# Patient Record
Sex: Female | Born: 1960
Health system: Southern US, Community
[De-identification: ages and names within clinical notes are randomized; demographics above are authoritative.]

## PROBLEM LIST (undated history)

## (undated) DIAGNOSIS — I700 Atherosclerosis of aorta: Secondary | ICD-10-CM

## (undated) DIAGNOSIS — T451X5A Adverse effect of antineoplastic and immunosuppressive drugs, initial encounter: Secondary | ICD-10-CM

## (undated) DIAGNOSIS — G620 Drug-induced polyneuropathy: Secondary | ICD-10-CM

## (undated) DIAGNOSIS — I10 Essential (primary) hypertension: Secondary | ICD-10-CM

## (undated) DIAGNOSIS — K219 Gastro-esophageal reflux disease without esophagitis: Secondary | ICD-10-CM

## (undated) DIAGNOSIS — J189 Pneumonia, unspecified organism: Secondary | ICD-10-CM

## (undated) DIAGNOSIS — J449 Chronic obstructive pulmonary disease, unspecified: Secondary | ICD-10-CM

## (undated) DIAGNOSIS — E872 Acidosis, unspecified: Secondary | ICD-10-CM

## (undated) DIAGNOSIS — D696 Thrombocytopenia, unspecified: Secondary | ICD-10-CM

## (undated) DIAGNOSIS — N179 Acute kidney failure, unspecified: Secondary | ICD-10-CM

## (undated) DIAGNOSIS — E876 Hypokalemia: Secondary | ICD-10-CM

## (undated) DIAGNOSIS — C50919 Malignant neoplasm of unspecified site of unspecified female breast: Secondary | ICD-10-CM

## (undated) DIAGNOSIS — F32A Depression, unspecified: Secondary | ICD-10-CM

## (undated) DIAGNOSIS — F419 Anxiety disorder, unspecified: Secondary | ICD-10-CM

## (undated) DIAGNOSIS — E43 Unspecified severe protein-calorie malnutrition: Secondary | ICD-10-CM

## (undated) DIAGNOSIS — Z9221 Personal history of antineoplastic chemotherapy: Secondary | ICD-10-CM

## (undated) DIAGNOSIS — C50911 Malignant neoplasm of unspecified site of right female breast: Secondary | ICD-10-CM

## (undated) DIAGNOSIS — R0600 Dyspnea, unspecified: Secondary | ICD-10-CM

## (undated) HISTORY — DX: Gastro-esophageal reflux disease without esophagitis: K21.9

## (undated) HISTORY — DX: Anxiety disorder, unspecified: F41.9

## (undated) HISTORY — DX: Malignant neoplasm of unspecified site of unspecified female breast: C50.919

## (undated) HISTORY — PX: BREAST BIOPSY: SHX20

## (undated) HISTORY — PX: FOOT SURGERY: SHX648

## (undated) HISTORY — DX: Atherosclerosis of aorta: I70.0

---

## 2004-01-03 ENCOUNTER — Emergency Department: Payer: Self-pay | Admitting: Emergency Medicine

## 2004-01-06 ENCOUNTER — Emergency Department: Payer: Self-pay | Admitting: Internal Medicine

## 2004-01-16 ENCOUNTER — Emergency Department: Payer: Self-pay | Admitting: Unknown Physician Specialty

## 2004-07-09 ENCOUNTER — Emergency Department: Payer: Self-pay | Admitting: Emergency Medicine

## 2005-05-31 ENCOUNTER — Other Ambulatory Visit: Payer: Self-pay

## 2005-06-01 ENCOUNTER — Ambulatory Visit: Payer: Self-pay | Admitting: Surgery

## 2009-11-24 ENCOUNTER — Other Ambulatory Visit: Payer: Self-pay | Admitting: Internal Medicine

## 2014-02-16 ENCOUNTER — Inpatient Hospital Stay: Payer: Self-pay | Admitting: Internal Medicine

## 2014-02-16 LAB — BASIC METABOLIC PANEL
Anion Gap: 22 — ABNORMAL HIGH (ref 7–16)
BUN: 12 mg/dL (ref 7–18)
CREATININE: 0.77 mg/dL (ref 0.60–1.30)
Calcium, Total: 6.7 mg/dL — CL (ref 8.5–10.1)
Chloride: 109 mmol/L — ABNORMAL HIGH (ref 98–107)
Co2: 9 mmol/L — CL (ref 21–32)
EGFR (Non-African Amer.): >60
GLUCOSE: 217 mg/dL — AB (ref 65–99)
Osmolality: 286 (ref 275–301)
Potassium: 5.7 mmol/L — ABNORMAL HIGH (ref 3.5–5.1)
Sodium: 140 mmol/L (ref 136–145)

## 2014-02-16 LAB — CBC
HCT: 51.1 % — ABNORMAL HIGH (ref 35.0–47.0)
HGB: 15.7 g/dL (ref 12.0–16.0)
MCH: 33.3 pg (ref 26.0–34.0)
MCHC: 30.8 g/dL — AB (ref 32.0–36.0)
MCV: 108 fL — ABNORMAL HIGH (ref 80–100)
PLATELETS: 332 10*3/uL (ref 150–440)
RBC: 4.73 10*6/uL (ref 3.80–5.20)
RDW: 15.7 % — ABNORMAL HIGH (ref 11.5–14.5)
WBC: 19.3 10*3/uL — AB (ref 3.6–11.0)

## 2014-02-16 LAB — COMPREHENSIVE METABOLIC PANEL
ALK PHOS: 56 U/L
ALT: 68 U/L — AB
ALT: 41 U/L
ANION GAP: 31 — AB (ref 7–16)
AST: 45 U/L — AB (ref 15–37)
Albumin: 5.1 g/dL — ABNORMAL HIGH (ref 3.4–5.0)
Albumin: 3.3 g/dL — ABNORMAL LOW (ref 3.4–5.0)
Alkaline Phosphatase: 95 U/L
Anion Gap: 19 — ABNORMAL HIGH (ref 7–16)
BILIRUBIN TOTAL: 0.5 mg/dL (ref 0.2–1.0)
BUN: 14 mg/dL (ref 7–18)
BUN: 8 mg/dL (ref 7–18)
Bilirubin,Total: 0.8 mg/dL (ref 0.2–1.0)
CALCIUM: 9.1 mg/dL (ref 8.5–10.1)
CALCIUM: 7.7 mg/dL — AB (ref 8.5–10.1)
CREATININE: 1.07 mg/dL (ref 0.60–1.30)
Chloride: 100 mmol/L (ref 98–107)
Chloride: 103 mmol/L (ref 98–107)
Co2: 5 mmol/L — CL (ref 21–32)
Co2: 13 mmol/L — ABNORMAL LOW (ref 21–32)
Creatinine: 0.76 mg/dL (ref 0.60–1.30)
EGFR (African American): >60
EGFR (African American): >60
EGFR (Non-African Amer.): 57 — ABNORMAL LOW
GLUCOSE: 189 mg/dL — AB (ref 65–99)
Glucose: 150 mg/dL — ABNORMAL HIGH (ref 65–99)
OSMOLALITY: 277 (ref 275–301)
Osmolality: 271 (ref 275–301)
POTASSIUM: 4.9 mmol/L (ref 3.5–5.1)
POTASSIUM: 4 mmol/L (ref 3.5–5.1)
SGOT(AST): 69 U/L — ABNORMAL HIGH (ref 15–37)
SODIUM: 136 mmol/L (ref 136–145)
SODIUM: 135 mmol/L — AB (ref 136–145)
TOTAL PROTEIN: 6.5 g/dL (ref 6.4–8.2)
Total Protein: 9.9 g/dL — ABNORMAL HIGH (ref 6.4–8.2)

## 2014-02-16 LAB — ETHANOL

## 2014-02-16 LAB — URINALYSIS, COMPLETE
Bacteria: NONE SEEN
Bilirubin,UR: NEGATIVE
Glucose,UR: NEGATIVE mg/dL (ref 0–75)
Hyaline Cast: 8
Leukocyte Esterase: NEGATIVE
Nitrite: NEGATIVE
Ph: 5 (ref 4.5–8.0)
Protein: >=500
RBC,UR: 10 /HPF (ref 0–5)
Specific Gravity: 1.014 (ref 1.003–1.030)
Squamous Epithelial: 5
WBC UR: 4 /HPF (ref 0–5)

## 2014-02-16 LAB — DRUG SCREEN, URINE
Amphetamines, Ur Screen: NEGATIVE (ref ?–1000)
Barbiturates, Ur Screen: NEGATIVE (ref ?–200)
Benzodiazepine, Ur Scrn: NEGATIVE (ref ?–200)
Cannabinoid 50 Ng, Ur ~~LOC~~: NEGATIVE (ref ?–50)
Cocaine Metabolite,Ur ~~LOC~~: NEGATIVE (ref ?–300)
MDMA (ECSTASY) UR SCREEN: NEGATIVE (ref ?–500)
Methadone, Ur Screen: NEGATIVE (ref ?–300)
Opiate, Ur Screen: NEGATIVE (ref ?–300)
Phencyclidine (PCP) Ur S: NEGATIVE (ref ?–25)
TRICYCLIC, UR SCREEN: NEGATIVE (ref ?–1000)

## 2014-02-16 LAB — PHOSPHORUS
Phosphorus: 6.4 mg/dL — ABNORMAL HIGH (ref 2.5–4.9)
Phosphorus: 3 mg/dL (ref 2.5–4.9)

## 2014-02-16 LAB — DIFFERENTIAL
Basophil #: 0.1 10*3/uL (ref 0.0–0.1)
Basophil %: 0.3 %
EOS ABS: 0 10*3/uL (ref 0.0–0.7)
Eosinophil %: 0 %
LYMPHS ABS: 1.7 10*3/uL (ref 1.0–3.6)
Lymphocyte %: 8.9 %
MONO ABS: 1.1 x10 3/mm — AB (ref 0.2–0.9)
MONOS PCT: 5.9 %
NEUTROS ABS: 16.3 10*3/uL — AB (ref 1.4–6.5)
Neutrophil %: 84.9 %

## 2014-02-16 LAB — OSMOLALITY: Osmolality: 325 mOsm/kg (ref 275–295)

## 2014-02-16 LAB — TSH: THYROID STIMULATING HORM: 1.74 u[IU]/mL

## 2014-02-16 LAB — MAGNESIUM: Magnesium: 2.3 mg/dL

## 2014-02-16 LAB — PROTIME-INR
INR: 1.1
Prothrombin Time: 14 secs (ref 11.5–14.7)

## 2014-02-16 LAB — TROPONIN I: Troponin-I: <0.02 ng/mL

## 2014-02-16 LAB — SALICYLATE LEVEL
SALICYLATES, SERUM: 14.1 mg/dL — AB
Salicylates, Serum: 4.3 mg/dL — ABNORMAL HIGH

## 2014-02-16 LAB — ACETAMINOPHEN LEVEL

## 2014-02-17 LAB — COMPREHENSIVE METABOLIC PANEL
AST: 41 U/L — AB (ref 15–37)
Albumin: 3 g/dL — ABNORMAL LOW (ref 3.4–5.0)
Alkaline Phosphatase: 47 U/L
Anion Gap: 8 (ref 7–16)
BUN: 6 mg/dL — ABNORMAL LOW (ref 7–18)
Bilirubin,Total: 0.8 mg/dL (ref 0.2–1.0)
Calcium, Total: 7.9 mg/dL — ABNORMAL LOW (ref 8.5–10.1)
Chloride: 100 mmol/L (ref 98–107)
Co2: 26 mmol/L (ref 21–32)
Creatinine: 0.81 mg/dL (ref 0.60–1.30)
EGFR (African American): >60
EGFR (Non-African Amer.): >60
GLUCOSE: 108 mg/dL — AB (ref 65–99)
Osmolality: 266 (ref 275–301)
Potassium: 2.9 mmol/L — ABNORMAL LOW (ref 3.5–5.1)
SGPT (ALT): 36 U/L
Sodium: 134 mmol/L — ABNORMAL LOW (ref 136–145)
Total Protein: 5.9 g/dL — ABNORMAL LOW (ref 6.4–8.2)

## 2014-02-17 LAB — CBC WITH DIFFERENTIAL/PLATELET
Basophil #: 0.1 10*3/uL (ref 0.0–0.1)
Basophil %: 0.6 %
EOS PCT: 0.4 %
Eosinophil #: 0 10*3/uL (ref 0.0–0.7)
HCT: 37.7 % (ref 35.0–47.0)
HGB: 12.7 g/dL (ref 12.0–16.0)
LYMPHS ABS: 3.2 10*3/uL (ref 1.0–3.6)
Lymphocyte %: 33.3 %
MCH: 33.6 pg (ref 26.0–34.0)
MCHC: 33.6 g/dL (ref 32.0–36.0)
MCV: 100 fL (ref 80–100)
MONO ABS: 0.4 x10 3/mm (ref 0.2–0.9)
Monocyte %: 3.8 %
NEUTROS PCT: 61.9 %
Neutrophil #: 5.9 10*3/uL (ref 1.4–6.5)
Platelet: 156 10*3/uL (ref 150–440)
RBC: 3.77 10*6/uL — AB (ref 3.80–5.20)
RDW: 14.1 % (ref 11.5–14.5)
WBC: 9.5 10*3/uL (ref 3.6–11.0)

## 2014-02-17 LAB — HEMOGLOBIN A1C: Hemoglobin A1C: 5.1 % (ref 4.2–6.3)

## 2014-02-17 LAB — MAGNESIUM
MAGNESIUM: 1.3 mg/dL — AB
Magnesium: 2.1 mg/dL

## 2014-02-17 LAB — URINE CULTURE

## 2014-02-17 LAB — POTASSIUM: Potassium: 3.3 mmol/L — ABNORMAL LOW (ref 3.5–5.1)

## 2014-02-21 LAB — CULTURE, BLOOD (SINGLE)

## 2014-07-05 NOTE — Discharge Summary (Signed)
PATIENT NAME:  Yvonne Scott, Yvonne Scott MR#:  [id number] DATE OF BIRTH:  [birth year]  DISCHARGE DIAGNOSES: 1.  Metabolic acidosis of unknown etiology.  2.  Tobacco abuse.  3.  Reflux esophagitis.   DISCHARGE MEDICATIONS: 1.  Protonix 40 mg oral 2 times a day.  2.  Magnesium oxide 400 mg daily.   DISCHARGE INSTRUCTIONS: Regular food, regular activity. Follow up with [name] in 2-4 weeks. Quit smoking and primary care physician followup in 1-2 weeks.   CONSULTS: 1.  Mariane Duval, MD, with critical care  2.  Munsoor Lilian Kapur, MD, with nephrology.   ADMITTING HISTORY AND PHYSICAL: Please see detailed H and P dictated previously. In brief, a 54 year old Caucasian female patient with no past medical history. Came to the hospital complaining of fever, vomiting. She was found to have severe metabolic acidosis with pH of 6.92 with bicarbonate less than 5 and admitted to hospitalist service.   HOSPITAL COURSE:  The patient was admitted to ICU. The patient's serum osmolality was elevated. She possibly had ingested 1 of the volatile substances, although the patient completely denied this. After discussing further with Dr. Mortimer Fries and nephrology, symptomatic management with continued with bicarbonate drip, aggressive IV fluid resuscitation with which the patient's symptoms resolved. She felt back to baseline by day of discharge other than having some mild dysphagia, which is chronic, for which she will follow up with GI as outpatient.   Prior to discharge, the patient's lungs sound clear. S1, S2 heard. No edema. The patient has ambulated in the hallway and is being discharged home in fair condition.   TIME SPENT ON DAY OF DISCHARGE IN DISCHARGE ACTIVITY: 40 minutes.    ____________________________ Leia Alf Landyn Lorincz, MD srs:LT D: 02/25/2014 16:50:24 ET T: 02/25/2014 17:37:45 ET JOB#: [id number]  cc: Alveta Heimlich R. Kervin Bones, MD, <Dictator> Neita Carp MD ELECTRONICALLY SIGNED 02/27/2014 13:07

## 2014-07-05 NOTE — H&P (Signed)
PATIENT NAME:  Yvonne Scott, Yvonne Scott MR#:  [id number] DATE OF BIRTH:  January 30, 1961  DATE OF ADMISSION:  02/16/2014  PRIMARY CARE PROVIDER: Anderson Malta A. Gilford Rile, MD   CHIEF COMPLAINT: Vomiting.   HISTORY OF PRESENT ILLNESS: A 54 year old Caucasian female patient with no past medical history. Not on any home medications presents to the Emergency Room complaining of severe vomiting since yesterday morning. The patient mentions that she just could not count the number of times she has thrown up since yesterday. Did not see any blood. Presently is only gagging, but no vomiting. She has been found to be severely tachycardic into 130s along with elevated white count of 19,000 and severely acidotic with pH of 6.92 with bicarbonate less than 5.   She does complain of her shortness of breath, but no cough. Has had decreased urination. No diarrhea, constipation. Epigastric abdominal pain only when she vomits. No blood in her stool. Does not complain of any rash. She used some amoxicillin 1 month prior for Lice infection along with some topical solutions a month prior. She has not had any alcohol other than 2 beers 2 days back. No injection of illicit or poisonous substances. No illicit drug use. Not on any medications. No aspirin intake.   PAST MEDICAL HISTORY: None.   FAMILY HISTORY: Reviewed, nothing significant.   SOCIAL HISTORY: The patient smokes a pack a day. Rare alcohol use. No illicit drug use.   CODE STATUS: Full code.  REVIEW OF SYSTEMS: Please see the history of presenting illness. Rest of the systems reviewed and negative.   HOME MEDICATIONS: None.   ALLERGIES: No known drug allergies.   PHYSICAL EXAMINATION: VITAL SIGNS: Shows temperature 95.6, pulse of 132, blood pressure 134/96, saturating 100% on room air. Respirations 26 per minute.  GENERAL: Thin, Caucasian female patient, looks critically ill, extremely anxious.  PSYCHIATRIC: Alert and oriented x3. HEENT: Normocephalic. Oral mucosae were  dry and pale. External ears are normal. Pupils bilaterally equal and reactive to light.  NECK: Supple. No thyromegaly. No palpable lymph nodes.No JVDCARDIOVASCULAR: S1, S2, tachycardic. No edema.  RESPIRATORY: Increased work of breathing, but clear to auscultation on both sides.  GASTROINTESTINAL: Soft abdomen. Mild tenderness in the epigastric area. No rigidity or guarding. Bowel sounds present.  GENITOURINARY: No CVA tenderness, bladder distention.  SKIN: Warm and dry. No petechiae, rash.  MUSCULOSKELETAL: No joint swelling, redness, or effusion of large joints. Normal muscle tone. NEUROLOGICAL: Motor strength 5/5 in upper and lower extremities.  LYMPHATIC: No cervical lymphadenopathy.  LABORATORY STUDIES: Glucose of 189, BUN 14, creatinine 1.07, bicarbonate of 5, potassium 4.9, anion gap 31. AST, ALT mildly elevated at 69, 68. Alkaline phosphatase normal. Bilirubin normal. Troponin less than 0.02.   WBC 19, hemoglobin 15.7, platelets of 332,000. INR 1.1, salicylate 14. Acetaminophen less than 2.  A pH of 6.92 with bicarbonate of less than 19, pO2 of 139, lactic acid 1.4.   EKG showed sinus tachycardia.  Chest x-ray shows nothing acute.   ASSESSMENT AND PLAN: 1.  Severe anion gap metabolic acidosis in a patient with significant vomiting. At this time, the etiology of her metabolic acidosis is unclear. Lactic acid is mildly elevated, but does not explain the low bicarbonate and acidosis. Glucose is normal. Ketones are pending. Alcohol level is normal. Salicylate level elevated, but therapeutic. Acetaminophen normal. The patient has not ingested any antifreeze or any illicit drugs. Not on any medications. At this time, the patient will be aggressively fluid resuscitated with normal saline. Ordered 2 L  of normal saline bolus, was started on a bicarbonate drip. We will also check serum osmolality for osmolar gap. Ketones are pending. We will also check a stat TSH. Check a CT scan of the abdomen  and pelvis to rule out intra-abdominal etiology. We will also check blood cultures and start the patient on antibiotics and treat this as sepsis until etiology is clear. We will consult critical care, Dr. Mortimer Fries and Nephrology Dr. Holley Raring, who I have discussed the case with. The patient is critically ill with high risk for cardiac arrest and death. May need intubation if the acidosis worsens. Check Volatile labs The patient's salicylate levels are mildly elevated, but she does not give any history of taking aspirin. Her levels could be on the down trend, could have been higher. We will repeat an ABG in 1 hour.  2.  Deep venous thrombosis prophylaxis with Lovenox.  3.  CODE STATUS: FULL CODE.  Critical care time spent today on this patient was 60 minutes.    ____________________________ Leia Alf Susannah Carbin, MD srs:sw D: 02/16/2014 08:15:18 ET T: 02/16/2014 08:56:13 ET JOB#: [id number]  cc: Alveta Heimlich R. Gurman Ashland, MD, <Dictator> Neita Carp MD ELECTRONICALLY SIGNED 02/27/2014 13:07

## 2014-07-09 NOTE — Consult Note (Signed)
PATIENT NAME:  Yvonne Scott, GRADILLA MR#:  [id number] DATE OF BIRTH:  Mar 03, 1961  DATE OF CONSULTATION:  02/16/2014  REFERRING PHYSICIAN:   CONSULTING PHYSICIAN:  Krishan Mcbreen Lilian Kapur, MD  REQUESTING PHYSICIAN: Dr. Darvin Neighbours.   REASON FOR CONSULTATION: Metabolic acidosis.   HISTORY OF PRESENT ILLNESS: The patient is a pleasant, 54 year old, Caucasian female, with no significant past medical history, who presented to [facility] with a 1-day history of nausea, vomiting. She reports that she ate at [employer] on Friday. Thereafter, she began having nausea and vomiting. She reports that she vomited her barbecue. Upon presentation here, she was found to have multiple metabolic derangements. She was found to have a very low serum bicarbonate of 5. This is now up to 9 with sodium bicarbonate repletion. In addition, there was proteinuria noted with a urine protein of greater than 500 mg/dL. Serum albumin was also elevated at 5.1. We also noted a large osmolar gap. Blood osmolality was 325 with a calculated osmolality of 286. She denies ingestion of ethanol, methanol, ethylene glycol, acetone, or isopropyl alcohol. She reports that several months ago, she did use topical lice treatment. She used permethrin, which at times is associated with metabolic acidosis in case reports. The patient was also found to be tachycardic. However, this is currently improving. Most recent ABG showed some improvement of the acidosis, as pH is now up to 7.030. Admission pH was 6.92 with pCO2 less than 19.   PAST MEDICAL HISTORY: None.   ALLERGIES: No known drug allergies.   CURRENT INPATIENT MEDICATIONS INCLUDE: 1. Sodium bicarbonate drip 150 mEq at 200 mL/h.  2. Lovenox 40 mg subcutaneously daily.  3. Zofran 4 mg IV q.4 h. p.r.n. 4. Zosyn 3.375 grams IV q.8 h. 5. Vancomycin 500 mg IV q.12 h.   SOCIAL HISTORY: The patient resides in [place]. She has a significant other. She is widowed, otherwise. The  patient has active tobacco abuse and smokes 1 pack of cigarettes per day. She also drinks alcohol and had 2 recent rum drinks. Denies illicit drug use.   FAMILY HISTORY: No significant family history that would relate to her current illness.   REVIEW OF SYSTEMS:   CONSTITUTIONAL: Denies fevers, chills. Does have some malaise now.  EYES: Reported blurry vision earlier, but none now.  HEENT: Denies headaches or hearing loss. Denies epistaxis.  CARDIOVASCULAR: Denies chest pain, but did have some palpitations.  RESPIRATORY: Does have an intermittent cough, but denies hemoptysis.  GASTROINTESTINAL: Reports nausea and vomiting, diminished appetite at present.  GENITOURINARY: Denies frequency, urgency, or dysuria.  MUSCULOSKELETAL: Denies joint pain, swelling or redness.  INTEGUMENTARY: Reports that she had a recent lice infestation.  NEUROLOGIC: Denies focal extremity weakness or numbness.  PSYCHIATRIC: Denies depression, bipolar disorder.  ENDOCRINE: Denies polyuria, polydipsia.  HEMATOLOGIC AND LYMPHATIC: Denies easy bruisability, bleeding, or swollen lymph nodes.  ALLERGY AND IMMUNOLOGIC: Denies seasonal allergies or history of immunodeficiency.   PHYSICAL EXAMINATION:  VITAL SIGNS: Temperature 97.8, pulse 100, respirations 17, blood pressure 95/65, pulse oximetry 99%.  GENERAL: Reveals a slender, Caucasian female, who appears her stated age, currently in no acute distress.  HEENT: Normocephalic, atraumatic. Extraocular movements are intact. Pupils equal, round, and reactive to light. No scleral icterus. Conjunctivae are pink. No epistaxis noted. Gross hearing intact. Oral mucosa moist.  NECK: Supple without JVD or lymphadenopathy.  LUNGS: Clear to auscultation bilaterally with normal respiratory effort.  HEART: S1, S2 regular rate and rhythm. No murmurs, rubs or gallops appreciated.  ABDOMEN:  Soft, nontender, nondistended. Bowel sounds positive. No rebound or guarding. No gross organomegaly  appreciated.  EXTREMITIES: No clubbing, cyanosis, or edema.  NEUROLOGIC: The patient is awake, alert, and oriented to time, person, and place. Strength is 5/5 in both upper and lower extremities.  SKIN: Warm and dry. There are small excoriations noted on bilateral upper extremities; however, these appear to be healing.  MUSCULOSKELETAL: No joint redness, swelling or tenderness appreciated.  GENITOURINARY: No suprapubic tenderness is noted at this time.  PSYCHIATRIC: The patient with appropriate affect, but is slightly anxious. She has reasonable insight.   LABORATORY DATA: From initial presentation: Sodium 136, potassium 4.9, chloride 100, CO2 5, BUN 14, creatinine 1.07, glucose 189.   Repeat BMP this morning shows: Sodium of 140, potassium 5.7, chloride 109, CO2 9 BUN 12, creatinine 0.7, glucose 217, anion gap 22, measured osmolality 325, total protein 9.9, albumin 5.1, total bilirubin 0.5, alkaline phosphatase 95, AST 69, ALT 68, troponin less than 0.02. TSH 1.74.   Urine drug screen negative.   CBC shows WBC 19.3, hemoglobin 15.7, hematocrit 51, platelets 332,000, MCV of 108. INR 1.1.   Urine protein greater than 500 mg/dL, urine pH of 5, 10 RBCs per high-power field, 4 WBCs per high-power field.   Initial salicylate level was 69.6 with subsequent salicylate level of 4.3.   Initial ABG showed pH of 6.9, pCO2 less than 19, pO2 of 139.   Follow-up ABG showed a pH of 7.030, pCO2 less than 19, pO2 of 91.   IMPRESSION: This is a 54 year old, Caucasian female, with past medical history of a dog bite and lice infestation, who presented to [facility] with nausea, vomiting and found to have tachycardia and severe osmolar gap, as well as anion gap metabolic acidosis.   PROBLEM LIST:  1. Metabolic acidosis.  2. Hyperkalemia.  3. Proteinuria.  4. Hyperphosphatemia.   PLAN: The patient presents with a very interesting case. She is found to have a large osmolar gap and is  also found as having an anion gap metabolic acidosis. This would potentially suggest some type of occult ingestion such as methanol, ethanol, ethylene glycol, acetone, isopropyl alcohol and propylene glycol. Serum volatiles have been ordered; however, this will take some time to become available. At present, it appears that the patient's acidosis is improving. pH is up to 7.030. Therefore, at this time, we would recommend continuation of bicarbonate drip. In addition, after reviewing the literature, permethrin, which is a topical lice treatment, has been associated with metabolic acidosis in case reports; however, the patient reports that it has been some time since she has used this medication. In addition, hypergammaglobulinemia can lead to a similar picture at times. Therefore SPEP and UPEP were appropriately ordered. If SPEP and UPEP are found to be abnormal, we would recommend consideration of hematology consultation. For now, continue current course and follow up BMP periodically.   I would like to thank Dr. Darvin Neighbours for this kind referral.    ____________________________ Tama High, MD mnl:JT D: 02/16/2014 12:22:11 ET T: 02/16/2014 13:16:08 ET JOB#: [id number]  cc: Tama High, MD, <Dictator> Mariah Milling Juniper Cobey MD ELECTRONICALLY SIGNED 03/20/2014 15:04

## 2014-12-04 ENCOUNTER — Ambulatory Visit: Payer: Self-pay | Admitting: Family Medicine

## 2015-03-20 ENCOUNTER — Telehealth: Payer: Self-pay

## 2015-03-20 NOTE — Telephone Encounter (Signed)
Patient called at 4:30 requesting rx for antibiotic for sinus infection.  I informed her you had already left the weekend and did not know if you will be able to do it or when you would even check your computer.

## 2015-03-24 NOTE — Telephone Encounter (Signed)
Recommend urgent care evaluation

## 2015-09-10 ENCOUNTER — Ambulatory Visit: Payer: Self-pay | Admitting: Family Medicine

## 2015-11-19 ENCOUNTER — Encounter: Payer: Self-pay | Admitting: Family Medicine

## 2015-11-19 ENCOUNTER — Ambulatory Visit (INDEPENDENT_AMBULATORY_CARE_PROVIDER_SITE_OTHER): Payer: Self-pay | Admitting: Family Medicine

## 2015-11-19 VITALS — BP 130/82 | HR 92 | Temp 98.6°F | Ht 62.0 in | Wt 110.0 lb

## 2015-11-19 DIAGNOSIS — Z720 Tobacco use: Secondary | ICD-10-CM | POA: Insufficient documentation

## 2015-11-19 DIAGNOSIS — Z5181 Encounter for therapeutic drug level monitoring: Secondary | ICD-10-CM

## 2015-11-19 DIAGNOSIS — R030 Elevated blood-pressure reading, without diagnosis of hypertension: Secondary | ICD-10-CM

## 2015-11-19 DIAGNOSIS — I10 Essential (primary) hypertension: Secondary | ICD-10-CM | POA: Insufficient documentation

## 2015-11-19 DIAGNOSIS — Z1159 Encounter for screening for other viral diseases: Secondary | ICD-10-CM

## 2015-11-19 DIAGNOSIS — J0100 Acute maxillary sinusitis, unspecified: Secondary | ICD-10-CM

## 2015-11-19 DIAGNOSIS — K219 Gastro-esophageal reflux disease without esophagitis: Secondary | ICD-10-CM

## 2015-11-19 LAB — CBC WITH DIFFERENTIAL/PLATELET
Basophils Absolute: 65 cells/uL (ref 0–200)
Basophils Relative: 1 %
EOS ABS: 130 {cells}/uL (ref 15–500)
Eosinophils Relative: 2 %
HEMATOCRIT: 41.1 % (ref 35.0–45.0)
Hemoglobin: 13.9 g/dL (ref 11.7–15.5)
Lymphocytes Relative: 47 %
Lymphs Abs: 3055 cells/uL (ref 850–3900)
MCH: 32.7 pg (ref 27.0–33.0)
MCHC: 33.8 g/dL (ref 32.0–36.0)
MCV: 96.7 fL (ref 80.0–100.0)
MONO ABS: 390 {cells}/uL (ref 200–950)
MPV: 10.5 fL (ref 7.5–12.5)
Monocytes Relative: 6 %
Neutro Abs: 2860 cells/uL (ref 1500–7800)
Neutrophils Relative %: 44 %
Platelets: 260 10*3/uL (ref 140–400)
RBC: 4.25 MIL/uL (ref 3.80–5.10)
RDW: 14 % (ref 11.0–15.0)
WBC: 6.5 10*3/uL (ref 3.8–10.8)

## 2015-11-19 LAB — COMPLETE METABOLIC PANEL WITH GFR
ALBUMIN: 4.6 g/dL (ref 3.6–5.1)
ALK PHOS: 59 U/L (ref 33–130)
ALT: 19 U/L (ref 6–29)
AST: 27 U/L (ref 10–35)
BILIRUBIN TOTAL: 0.6 mg/dL (ref 0.2–1.2)
BUN: 7 mg/dL (ref 7–25)
CO2: 26 mmol/L (ref 20–31)
Calcium: 9.7 mg/dL (ref 8.6–10.4)
Chloride: 105 mmol/L (ref 98–110)
Creat: 0.61 mg/dL (ref 0.50–1.05)
GFR, Est African American: >89 mL/min (ref >=60)
GFR, Est Non African American: >89 mL/min (ref >=60)
GLUCOSE: 100 mg/dL — AB (ref 65–99)
Potassium: 4.3 mmol/L (ref 3.5–5.3)
SODIUM: 144 mmol/L (ref 135–146)
TOTAL PROTEIN: 7.3 g/dL (ref 6.1–8.1)

## 2015-11-19 LAB — MAGNESIUM: Magnesium: 1.5 mg/dL (ref 1.5–2.5)

## 2015-11-19 MED ORDER — OMEPRAZOLE 40 MG PO CPDR: 40.0000 mg | DELAYED_RELEASE_CAPSULE | Freq: Every day | ORAL | 2 refills | Status: DC

## 2015-11-19 MED ORDER — AMOXICILLIN-POT CLAVULANATE 875-125 MG PO TABS: 1.0000 | ORAL_TABLET | Freq: Two times a day (BID) | ORAL | 0 refills | Status: DC

## 2015-11-19 NOTE — Assessment & Plan Note (Signed)
I am here to help when ready; she is not ready to quit

## 2015-11-19 NOTE — Assessment & Plan Note (Signed)
Discussed one-time hep C screening recommendation for individuals born between 1945-1965 per USPSTF guidelines; patient agrees with testing; Hep C Ab ordered 

## 2015-11-19 NOTE — Patient Instructions (Addendum)
Your goal blood pressure is less than 140 mmHg on top. Try to follow the DASH guidelines (DASH stands for Dietary Approaches to Stop Hypertension) Try to limit the sodium in your diet.  Ideally, consume less than 1.5 grams (less than 1,500mg ) per day. Do not add salt when cooking or at the table.  Check the sodium amount on labels when shopping, and choose items lower in sodium when given a choice. Avoid or limit foods that already contain a lot of sodium. Eat a diet rich in fruits and vegetables and whole grains.  Try to use PLAIN allergy medicine without the decongestant Avoid: phenylephrine, phenylpropanolamine, and pseudoephredine  I do encourage you to quit smoking Call [phone] to sign up for smoking cessation classes You can call 1-800-QUIT-NOW to talk with a smoking cessation coach  12 Ways to Curb Anxiety  ?Anxiety is normal human sensation. It is what helped our ancestors survive the pitfalls of the wilderness. Anxiety is defined as experiencing worry or nervousness about an imminent event or something with an uncertain outcome. It is a feeling experienced by most people at some point in their lives. Anxiety can be triggered by a very personal issue, such as the illness of a loved one, or an event of global proportions, such as a refugee crisis. Some of the symptoms of anxiety are:  Feeling restless.  Having a feeling of impending danger.  Increased heart rate.  Rapid breathing. Sweating.  Shaking.  Weakness or feeling tired.  Difficulty concentrating on anything except the current worry.  Insomnia.  Stomach or bowel problems. What can we do about anxiety we may be feeling? There are many techniques to help manage stress and relax. Here are 12 ways you can reduce your anxiety almost immediately: 1. Turn off the constant feed of information. Take a social media sabbatical. Studies have shown that social media directly contributes to social anxiety.  2. Monitor your television  viewing habits. Are you watching shows that are also contributing to your anxiety, such as 24-hour news stations? Try watching something else, or better yet, nothing at all. Instead, listen to music, read an inspirational book or practice a hobby. 3. Eat nutritious meals. Also, don't skip meals and keep healthful snacks on hand. Hunger and poor diet contributes to feeling anxious. 4. Sleep. Sleeping on a regular schedule for at least seven to eight hours a night will do wonders for your outlook when you are awake. 5. Exercise. Regular exercise will help rid your body of that anxious energy and help you get more restful sleep. 6. Try deep (diaphragmatic) breathing. Inhale slowly through your nose for five seconds and exhale through your mouth. 7. Practice acceptance and gratitude. When anxiety hits, accept that there are things out of your control that shouldn't be of immediate concern.  8. Seek out humor. When anxiety strikes, watch a funny video, read jokes or call a friend who makes you laugh. Laughter is healing for our bodies and releases endorphins that are calming. 9. Stay positive. Take the effort to replace negative thoughts with positive ones. Try to see a stressful situation in a positive light. Try to come up with solutions rather than dwelling on the problem. 10. Figure out what triggers your anxiety. Keep a journal and make note of anxious moments and the events surrounding them. This will help you identify triggers you can avoid or even eliminate. 11. Talk to someone. Let a trusted friend, family member or even trained professional know that you  are feeling overwhelmed and anxious. Verbalize what you are feeling and why.  12. Volunteer. If your anxiety is triggered by a crisis on a large scale, become an advocate and work to resolve the problem that is causing you unease. Anxiety is often unwelcome and can become overwhelming. If not kept in check, it can become a disorder that could require  medical treatment. However, if you take the time to care for yourself and avoid the triggers that make you anxious, you will be able to find moments of relaxation and clarity that make your life much more enjoyable.  Caution: prolonged use of proton pump inhibitors like omeprazole (Prilosec), pantoprazole (Protonix), esomeprazole (Nexium), and others like Dexilant and Aciphex may increase your risk of pneumonia, Clostridium difficile colitis, osteoporosis, anemia and other health complications Try to limit or avoid triggers like coffee, caffeinated beverages, onions, chocolate, spicy foods, peppermint, acid foods like pizza, spaghetti sauce, and orange juice Lose weight if you are overweight or obese Try elevating the head of your bed by placing a small wedge between your mattress and box springs to keep acid in the stomach at night instead of coming up into your esophagus   DASH Eating Plan DASH stands for "Dietary Approaches to Stop Hypertension." The DASH eating plan is a healthy eating plan that has been shown to reduce high blood pressure (hypertension). Additional health benefits may include reducing the risk of type 2 diabetes mellitus, heart disease, and stroke. The DASH eating plan may also help with weight loss. WHAT DO I NEED TO KNOW ABOUT THE DASH EATING PLAN? For the DASH eating plan, you will follow these general guidelines:  Choose foods with a percent daily value for sodium of less than 5% (as listed on the food label).  Use salt-free seasonings or herbs instead of table salt or sea salt.  Check with your health care provider or pharmacist before using salt substitutes.  Eat lower-sodium products, often labeled as "lower sodium" or "no salt added."  Eat fresh foods.  Eat more vegetables, fruits, and low-fat dairy products.  Choose whole grains. Look for the word "whole" as the first word in the ingredient list.  Choose fish and skinless chicken or [place] more often than red  meat. Limit fish, poultry, and meat to 6 oz (170 g) each day.  Limit sweets, desserts, sugars, and sugary drinks.  Choose heart-healthy fats.  Limit cheese to 1 oz (28 g) per day.  Eat more home-cooked food and less restaurant, buffet, and fast food.  Limit fried foods.  Cook foods using methods other than frying.  Limit canned vegetables. If you do use them, rinse them well to decrease the sodium.  When eating at a restaurant, ask that your food be prepared with less salt, or no salt if possible. WHAT FOODS CAN I EAT? Seek help from a dietitian for individual calorie needs. Grains Whole grain or whole wheat bread. Brown rice. Whole grain or whole wheat pasta. Quinoa, bulgur, and whole grain cereals. Low-sodium cereals. Corn or whole wheat flour tortillas. Whole grain cornbread. Whole grain crackers. Low-sodium crackers. Vegetables Fresh or frozen vegetables (raw, steamed, roasted, or grilled). Low-sodium or reduced-sodium tomato and vegetable juices. Low-sodium or reduced-sodium tomato sauce and paste. Low-sodium or reduced-sodium canned vegetables.  Fruits All fresh, canned (in natural juice), or frozen fruits. Meat and Other Protein Products Ground beef (85% or leaner), grass-fed beef, or beef trimmed of fat. Skinless chicken or [place]. Ground chicken or [place]. Pork trimmed of fat.  All fish and seafood. Eggs. Dried beans, peas, or lentils. Unsalted nuts and seeds. Unsalted canned beans. Dairy Low-fat dairy products, such as skim or 1% milk, 2% or reduced-fat cheeses, low-fat ricotta or cottage cheese, or plain low-fat yogurt. Low-sodium or reduced-sodium cheeses. Fats and Oils Tub margarines without trans fats. Light or reduced-fat mayonnaise and salad dressings (reduced sodium). Avocado. Safflower, olive, or canola oils. Natural peanut or almond butter. Other Unsalted popcorn and pretzels. The items listed above may not be a complete list of recommended foods or beverages.  Contact your dietitian for more options. WHAT FOODS ARE NOT RECOMMENDED? Grains White bread. White pasta. White rice. Refined cornbread. Bagels and croissants. Crackers that contain trans fat. Vegetables Creamed or fried vegetables. Vegetables in a cheese sauce. Regular canned vegetables. Regular canned tomato sauce and paste. Regular tomato and vegetable juices. Fruits Dried fruits. Canned fruit in light or heavy syrup. Fruit juice. Meat and Other Protein Products Fatty cuts of meat. Ribs, chicken wings, bacon, sausage, bologna, salami, chitterlings, fatback, hot dogs, bratwurst, and packaged luncheon meats. Salted nuts and seeds. Canned beans with salt. Dairy Whole or 2% milk, cream, half-and-half, and cream cheese. Whole-fat or sweetened yogurt. Full-fat cheeses or blue cheese. Nondairy creamers and whipped toppings. Processed cheese, cheese spreads, or cheese curds. Condiments Onion and garlic salt, seasoned salt, table salt, and sea salt. Canned and packaged gravies. Worcestershire sauce. Tartar sauce. Barbecue sauce. Teriyaki sauce. Soy sauce, including reduced sodium. Steak sauce. Fish sauce. Oyster sauce. Cocktail sauce. Horseradish. Ketchup and mustard. Meat flavorings and tenderizers. Bouillon cubes. Hot sauce. Tabasco sauce. Marinades. Taco seasonings. Relishes. Fats and Oils Butter, stick margarine, lard, shortening, ghee, and bacon fat. Coconut, palm kernel, or palm oils. Regular salad dressings. Other Pickles and olives. Salted popcorn and pretzels. The items listed above may not be a complete list of foods and beverages to avoid. Contact your dietitian for more information. WHERE CAN I FIND MORE INFORMATION? National Heart, Lung, and Blood Institute: travelstabloid.com   This information is not intended to replace advice given to you by your health care provider. Make sure you discuss any questions you have with your health care provider.    Document Released: 02/17/2011 Document Revised: 03/21/2014 Document Reviewed: 01/02/2013 Elsevier Interactive Patient Education [employer].

## 2015-11-19 NOTE — Assessment & Plan Note (Addendum)
Antibiotic only if needed; smoking cessation would help; risk of C diff discussed (patient was initially prescribed augmentin, then called back and said it make her sick, thinks too strong, so started doxy)

## 2015-11-19 NOTE — Progress Notes (Signed)
BP 130/82   Pulse 92   Temp 98.6 F (37 C)   Ht 5\' 2"  (1.575 m)   Wt 110 lb (49.9 kg)   LMP 11/19/2002 (Approximate)   SpO2 96%   BMI 20.12 kg/m    Subjective:    Patient ID: Yvonne Scott, female    DOB: 1960/11/25, 55 y.o.   MRN: [id number]  HPI: Yvonne Scott is a 55 y.o. female  Chief Complaint  Patient presents with  . Hypertension    high when went to oral surgeon   . Gastroesophageal Reflux    She is new to me today; she was the at the dentist and they said it was high and said check out with family medicine doctor; had a cigarette right before having her BP checked; eats salty foods;   Lots of stress lately with family issue Adds salt to food She does garden and active No decongestants Using advil once in a while  Congestion and fluid in her ears, congestion in her ears and head; can feel it draining, into the chest; coughing that stuff up; no fever or body aches; she had a pain in her back and called the nurse, but says that was from lifting something; laid on a heating pad and that is better;  No chills or night sweats (nothing new); has menopause  Has GERD; had been tasting acid before taking medicine, but medicine is keeping it under control; was in the hospital and almost died  Depression screen [facility] 05-08-2022 11/19/2015  Decreased Interest 2  Down, Depressed, Hopeless 2  PHQ - 2 Score 4  Altered sleeping 2  Tired, decreased energy 0  Change in appetite 0  Feeling bad or failure about yourself  0  Trouble concentrating 0  Moving slowly or fidgety/restless 0  Suicidal thoughts 0  PHQ-9 Score 6  Difficult doing work/chores Not difficult at all   Relevant past medical, surgical, family and social history reviewed Past Medical History:  Diagnosis Date  . Anxiety   . GERD (gastroesophageal reflux disease)    History reviewed. No pertinent surgical history.  MD note: no surgery  Family History  Problem Relation Age of Onset  . Cancer Mother 65   breast   Social History  Substance Use Topics  . Smoking status: Current Some Day Smoker    Packs/day: 1.00    Years: 36.00    Types: Cigarettes    Start date: 03/15/1979  . Smokeless tobacco: Never Used  . Alcohol use 1.8 oz/week    3 Standard drinks or equivalent per week     Comment: weekly    Interim medical history since last visit reviewed. Allergies and medications reviewed  Review of Systems Per HPI unless specifically indicated above     Objective:    BP 130/82   Pulse 92   Temp 98.6 F (37 C)   Ht 5\' 2"  (1.575 m)   Wt 110 lb (49.9 kg)   LMP 11/19/2002 (Approximate)   SpO2 96%   BMI 20.12 kg/m   Wt Readings from Last 3 Encounters:  11/19/15 110 lb (49.9 kg)    Physical Exam  Constitutional: She appears well-developed and well-nourished. No distress.  HENT:  Head: Normocephalic and atraumatic.  Eyes: EOM are normal. No scleral icterus.  Neck: No thyromegaly present.  Cardiovascular: Normal rate, regular rhythm and normal heart sounds.   No murmur heard. Pulmonary/Chest: Effort normal and breath sounds normal.  Abdominal: Soft. Bowel sounds are normal.  She exhibits no distension.  Musculoskeletal: Normal range of motion. She exhibits no edema.  Neurological: She is alert.  Skin: Skin is warm and dry. She is not diaphoretic. No pallor.  Psychiatric: She has a normal mood and affect. Her behavior is normal. Judgment and thought content normal.      Assessment & Plan:   Problem List Items Addressed This Visit      Respiratory   Sinusitis, acute maxillary    Antibiotic only if needed; smoking cessation would help; risk of C diff discussed      Relevant Medications   doxycycline (VIBRA-TABS) 100 MG tablet     Digestive   GERD (gastroesophageal reflux disease)    Limit trigger foods; continue PPI, but be aware of cautions, skip a day or two now and then if able      Relevant Medications   omeprazole (PRILOSEC) 40 MG capsule     Other   Tobacco  abuse    I am here to help when ready; she is not ready to quit      Need for hepatitis C screening test    Discussed one-time hep C screening recommendation for individuals born between [phone] per USPSTF guidelines; patient agrees with testing; Hep C Ab ordered      Relevant Orders   Hepatitis C Antibody (Completed)   Medication monitoring encounter    Check Mg2+, CBC      Relevant Orders   CBC with Differential/Platelet (Completed)   Magnesium (Completed)   COMPLETE METABOLIC PANEL WITH GFR (Completed)   Elevated blood pressure (not hypertension) - Primary    Try DASH guidelines, stress reduction       Other Visit Diagnoses   None.     Follow up plan: Return next month or two, for complete physical.  An after-visit summary was printed and given to the patient at [place].  Please see the patient instructions which may contain other information and recommendations beyond what is mentioned above in the assessment and plan.  Meds ordered this encounter  Medications  . DISCONTD: omeprazole (PRILOSEC) 40 MG capsule    Sig: Take 40 mg by mouth daily.  Marland Kitchen DISCONTD: amoxicillin-clavulanate (AUGMENTIN) 875-125 MG tablet    Sig: Take 1 tablet by mouth 2 (two) times daily.    Dispense:  20 tablet    Refill:  0  . omeprazole (PRILOSEC) 40 MG capsule    Sig: Take 1 capsule (40 mg total) by mouth daily.    Dispense:  30 capsule    Refill:  2  . doxycycline (VIBRA-TABS) 100 MG tablet    Sig: Take 1 tablet (100 mg total) by mouth 2 (two) times daily. Stop amox/clav    Dispense:  20 tablet    Refill:  0    Orders Placed This Encounter  Procedures  . CBC with Differential/Platelet  . Magnesium  . Hepatitis C Antibody  . COMPLETE METABOLIC PANEL WITH GFR

## 2015-11-19 NOTE — Assessment & Plan Note (Signed)
Try DASH guidelines, stress reduction

## 2015-11-19 NOTE — Assessment & Plan Note (Signed)
Limit trigger foods; continue PPI, but be aware of cautions, skip a day or two now and then if able

## 2015-11-19 NOTE — Assessment & Plan Note (Signed)
Check Mg2+, CBC

## 2015-11-20 LAB — HEPATITIS C ANTIBODY: HCV Ab: NEGATIVE

## 2015-11-20 MED ORDER — DOXYCYCLINE HYCLATE 100 MG PO TABS: 100.0000 mg | ORAL_TABLET | Freq: Two times a day (BID) | ORAL | 0 refills | Status: AC

## 2015-11-30 ENCOUNTER — Telehealth: Payer: Self-pay | Admitting: Family Medicine

## 2015-11-30 NOTE — Telephone Encounter (Signed)
She should be re-evaluated then, either here or urgent care; she was seen more than a week ago and something may have changed; offer visit here or [place] urgent care or elsewhere; thank you

## 2015-12-01 NOTE — Telephone Encounter (Signed)
Pt notified states she will call back later

## 2016-03-10 ENCOUNTER — Telehealth: Payer: Self-pay | Admitting: Family Medicine

## 2016-03-10 NOTE — Telephone Encounter (Signed)
Requesting return call. Does not wish to go back to the hospital. But it feels like knots in her stomach and feel confused. Pt feel like something is wrong with her urine as well

## 2016-03-10 NOTE — Telephone Encounter (Signed)
I'll recommend a MyChart visit or urgent care visit or visit here with one of our providers please

## 2016-03-10 NOTE — Telephone Encounter (Signed)
Left voice message for patient to return call. °

## 2016-08-24 ENCOUNTER — Encounter: Payer: Self-pay | Admitting: Family Medicine

## 2016-09-26 ENCOUNTER — Ambulatory Visit: Payer: Self-pay | Admitting: Family Medicine

## 2016-10-14 ENCOUNTER — Inpatient Hospital Stay
Admission: EM | Admit: 2016-10-14 | Discharge: 2016-10-17 | DRG: 641 | Disposition: A | Discharge status: Home / Self Care | Payer: Self-pay | Attending: Internal Medicine | Admitting: Internal Medicine

## 2016-10-14 ENCOUNTER — Inpatient Hospital Stay: Payer: Self-pay

## 2016-10-14 ENCOUNTER — Emergency Department: Payer: Self-pay

## 2016-10-14 DIAGNOSIS — F17210 Nicotine dependence, cigarettes, uncomplicated: Secondary | ICD-10-CM | POA: Diagnosis present

## 2016-10-14 DIAGNOSIS — R05 Cough: Secondary | ICD-10-CM

## 2016-10-14 DIAGNOSIS — E8889 Other specified metabolic disorders: Secondary | ICD-10-CM | POA: Diagnosis present

## 2016-10-14 DIAGNOSIS — N179 Acute kidney failure, unspecified: Secondary | ICD-10-CM | POA: Diagnosis present

## 2016-10-14 DIAGNOSIS — E162 Hypoglycemia, unspecified: Secondary | ICD-10-CM | POA: Diagnosis present

## 2016-10-14 DIAGNOSIS — R945 Abnormal results of liver function studies: Secondary | ICD-10-CM

## 2016-10-14 DIAGNOSIS — F1010 Alcohol abuse, uncomplicated: Secondary | ICD-10-CM | POA: Diagnosis present

## 2016-10-14 DIAGNOSIS — R7989 Other specified abnormal findings of blood chemistry: Secondary | ICD-10-CM

## 2016-10-14 DIAGNOSIS — R059 Cough, unspecified: Secondary | ICD-10-CM

## 2016-10-14 DIAGNOSIS — K2970 Gastritis, unspecified, without bleeding: Secondary | ICD-10-CM | POA: Diagnosis present

## 2016-10-14 DIAGNOSIS — K219 Gastro-esophageal reflux disease without esophagitis: Secondary | ICD-10-CM | POA: Diagnosis present

## 2016-10-14 DIAGNOSIS — R809 Proteinuria, unspecified: Secondary | ICD-10-CM | POA: Diagnosis present

## 2016-10-14 DIAGNOSIS — E8729 Other acidosis: Secondary | ICD-10-CM

## 2016-10-14 DIAGNOSIS — E860 Dehydration: Secondary | ICD-10-CM | POA: Diagnosis present

## 2016-10-14 DIAGNOSIS — R740 Nonspecific elevation of levels of transaminase and lactic acid dehydrogenase [LDH]: Secondary | ICD-10-CM | POA: Diagnosis present

## 2016-10-14 DIAGNOSIS — E872 Acidosis, unspecified: Secondary | ICD-10-CM | POA: Diagnosis present

## 2016-10-14 DIAGNOSIS — E869 Volume depletion, unspecified: Secondary | ICD-10-CM | POA: Diagnosis present

## 2016-10-14 DIAGNOSIS — J9690 Respiratory failure, unspecified, unspecified whether with hypoxia or hypercapnia: Secondary | ICD-10-CM

## 2016-10-14 DIAGNOSIS — R110 Nausea: Secondary | ICD-10-CM

## 2016-10-14 DIAGNOSIS — E876 Hypokalemia: Secondary | ICD-10-CM | POA: Diagnosis present

## 2016-10-14 DIAGNOSIS — Z79899 Other long term (current) drug therapy: Secondary | ICD-10-CM

## 2016-10-14 DIAGNOSIS — F419 Anxiety disorder, unspecified: Secondary | ICD-10-CM | POA: Diagnosis present

## 2016-10-14 LAB — GLUCOSE, CAPILLARY: GLUCOSE-CAPILLARY: 90 mg/dL (ref 65–99)

## 2016-10-14 LAB — CBC WITH DIFFERENTIAL/PLATELET
BASOS ABS: 0 10*3/uL (ref 0–0.1)
Basophils Relative: 0 %
EOS PCT: 0 %
Eosinophils Absolute: 0 10*3/uL (ref 0–0.7)
HCT: 49 % — ABNORMAL HIGH (ref 35.0–47.0)
HEMOGLOBIN: 15.8 g/dL (ref 12.0–16.0)
LYMPHS ABS: 1.6 10*3/uL (ref 1.0–3.6)
LYMPHS PCT: 13 %
MCH: 33.8 pg (ref 26.0–34.0)
MCHC: 32.3 g/dL (ref 32.0–36.0)
MCV: 104.5 fL — AB (ref 80.0–100.0)
Monocytes Absolute: 0.6 10*3/uL (ref 0.2–0.9)
Monocytes Relative: 5 %
NEUTROS ABS: 10.2 10*3/uL — AB (ref 1.4–6.5)
NEUTROS PCT: 82 %
PLATELETS: 223 10*3/uL (ref 150–440)
RBC: 4.68 MIL/uL (ref 3.80–5.20)
RDW: 14.5 % (ref 11.5–14.5)
WBC: 12.4 10*3/uL — AB (ref 3.6–11.0)

## 2016-10-14 LAB — BASIC METABOLIC PANEL
Anion gap: 20 — ABNORMAL HIGH (ref 5–15)
BUN: 8 mg/dL (ref 6–20)
CHLORIDE: 108 mmol/L (ref 101–111)
CO2: 12 mmol/L — ABNORMAL LOW (ref 22–32)
Calcium: 8.2 mg/dL — ABNORMAL LOW (ref 8.9–10.3)
Creatinine, Ser: 0.98 mg/dL (ref 0.44–1.00)
GFR calc non Af Amer: >60 mL/min (ref >60)
Glucose, Bld: 127 mg/dL — ABNORMAL HIGH (ref 65–99)
POTASSIUM: 4.3 mmol/L (ref 3.5–5.1)
SODIUM: 140 mmol/L (ref 135–145)

## 2016-10-14 LAB — LACTIC ACID, PLASMA
Lactic Acid, Venous: 2.2 mmol/L (ref 0.5–1.9)
Lactic Acid, Venous: 1.2 mmol/L (ref 0.5–1.9)

## 2016-10-14 LAB — URINE DRUG SCREEN, QUALITATIVE (ARMC ONLY)
Amphetamines, Ur Screen: NOT DETECTED
Barbiturates, Ur Screen: NOT DETECTED
Benzodiazepine, Ur Scrn: NOT DETECTED
Cannabinoid 50 Ng, Ur ~~LOC~~: NOT DETECTED
Cocaine Metabolite,Ur ~~LOC~~: NOT DETECTED
MDMA (ECSTASY) UR SCREEN: NOT DETECTED
Methadone Scn, Ur: NOT DETECTED
OPIATE, UR SCREEN: POSITIVE — AB
PHENCYCLIDINE (PCP) UR S: NOT DETECTED
Tricyclic, Ur Screen: NOT DETECTED

## 2016-10-14 LAB — COMPREHENSIVE METABOLIC PANEL
ALK PHOS: 87 U/L (ref 38–126)
ALT: 63 U/L — AB (ref 14–54)
AST: 78 U/L — AB (ref 15–41)
Albumin: 5.7 g/dL — ABNORMAL HIGH (ref 3.5–5.0)
Anion gap: 32 — ABNORMAL HIGH (ref 5–15)
BUN: 11 mg/dL (ref 6–20)
CALCIUM: 10 mg/dL (ref 8.9–10.3)
CHLORIDE: 97 mmol/L — AB (ref 101–111)
CO2: 7 mmol/L — AB (ref 22–32)
CREATININE: 1.26 mg/dL — AB (ref 0.44–1.00)
GFR calc Af Amer: 54 mL/min — ABNORMAL LOW (ref >60)
GFR, EST NON AFRICAN AMERICAN: 47 mL/min — AB (ref >60)
Glucose, Bld: 234 mg/dL — ABNORMAL HIGH (ref 65–99)
Potassium: 4.1 mmol/L (ref 3.5–5.1)
Sodium: 136 mmol/L (ref 135–145)
Total Bilirubin: 1.8 mg/dL — ABNORMAL HIGH (ref 0.3–1.2)
Total Protein: 9.9 g/dL — ABNORMAL HIGH (ref 6.5–8.1)

## 2016-10-14 LAB — BLOOD GAS, VENOUS
Acid-base deficit: 25.4 mmol/L — ABNORMAL HIGH (ref 0.0–2.0)
BICARBONATE: 6 mmol/L — AB (ref 20.0–28.0)
O2 SAT: 26.9 %
PATIENT TEMPERATURE: 37
PO2 VEN: 32 mmHg (ref 32.0–45.0)
pCO2, Ven: 28 mmHg — ABNORMAL LOW (ref 44.0–60.0)
pH, Ven: 6.94 — CL (ref 7.250–7.430)

## 2016-10-14 LAB — URINALYSIS, ROUTINE W REFLEX MICROSCOPIC
Bacteria, UA: NONE SEEN
Bilirubin Urine: NEGATIVE
Glucose, UA: 50 mg/dL — AB
Ketones, ur: 80 mg/dL — AB
LEUKOCYTES UA: NEGATIVE
NITRITE: NEGATIVE
PH: 6 (ref 5.0–8.0)
Protein, ur: 100 mg/dL — AB
SPECIFIC GRAVITY, URINE: 1.013 (ref 1.005–1.030)

## 2016-10-14 LAB — SALICYLATE LEVEL: Salicylate Lvl: <7 mg/dL (ref 2.8–30.0)

## 2016-10-14 LAB — PROTIME-INR
INR: 1.05
Prothrombin Time: 13.7 seconds (ref 11.4–15.2)

## 2016-10-14 LAB — OSMOLALITY: OSMOLALITY: 304 mosm/kg — AB (ref 275–295)

## 2016-10-14 LAB — LIPASE, BLOOD: LIPASE: 49 U/L (ref 11–51)

## 2016-10-14 LAB — PHOSPHORUS: PHOSPHORUS: 1.3 mg/dL — AB (ref 2.5–4.6)

## 2016-10-14 LAB — PROCALCITONIN: Procalcitonin: <0.1 ng/mL

## 2016-10-14 LAB — TROPONIN I: Troponin I: <0.03 ng/mL (ref <0.03)

## 2016-10-14 LAB — ETHANOL: Alcohol, Ethyl (B): <5 mg/dL (ref <5)

## 2016-10-14 LAB — MAGNESIUM: MAGNESIUM: 1.5 mg/dL — AB (ref 1.7–2.4)

## 2016-10-14 LAB — ACETAMINOPHEN LEVEL: Acetaminophen (Tylenol), Serum: <10 ug/mL — ABNORMAL LOW (ref 10–30)

## 2016-10-14 MED ORDER — SODIUM PHOSPHATES 45 MMOLE/15ML IV SOLN
30.0000 mmol | Freq: Once | INTRAVENOUS | Status: AC
  Administered 2016-10-14: 30 mmol via INTRAVENOUS
  Filled 2016-10-14: qty 10

## 2016-10-14 MED ORDER — ACETAMINOPHEN 650 MG RE SUPP: 650.0000 mg | Freq: Four times a day (QID) | RECTAL | Status: DC | PRN

## 2016-10-14 MED ORDER — LORAZEPAM 2 MG/ML IJ SOLN
1.0000 mg | Freq: Once | INTRAMUSCULAR | Status: AC
  Administered 2016-10-14: 1 mg via INTRAVENOUS
  Filled 2016-10-14: qty 1

## 2016-10-14 MED ORDER — ONDANSETRON HCL 4 MG/2ML IJ SOLN
4.0000 mg | Freq: Once | INTRAMUSCULAR | Status: AC
  Administered 2016-10-14: 4 mg via INTRAVENOUS
  Filled 2016-10-14: qty 2

## 2016-10-14 MED ORDER — SODIUM BICARBONATE 8.4 % IV SOLN
INTRAVENOUS | Status: AC
  Administered 2016-10-14: 200 meq via INTRAVENOUS
  Filled 2016-10-14: qty 50

## 2016-10-14 MED ORDER — LORAZEPAM 2 MG/ML IJ SOLN
1.0000 mg | INTRAMUSCULAR | Status: DC | PRN
  Administered 2016-10-14: 2 mg via INTRAVENOUS
  Filled 2016-10-14: qty 1

## 2016-10-14 MED ORDER — THIAMINE HCL 100 MG/ML IJ SOLN
Freq: Once | INTRAVENOUS | Status: AC
  Administered 2016-10-14: 23:00:00 via INTRAVENOUS
  Filled 2016-10-14: qty 1000

## 2016-10-14 MED ORDER — NICOTINE 14 MG/24HR TD PT24
14.0000 mg | MEDICATED_PATCH | Freq: Every day | TRANSDERMAL | Status: DC
  Administered 2016-10-14 – 2016-10-17 (×4): 14 mg via TRANSDERMAL
  Filled 2016-10-14 (×4): qty 1

## 2016-10-14 MED ORDER — HEPARIN SODIUM (PORCINE) 5000 UNIT/ML IJ SOLN
5000.0000 [IU] | Freq: Three times a day (TID) | INTRAMUSCULAR | Status: DC
  Administered 2016-10-14 – 2016-10-16 (×5): 5000 [IU] via SUBCUTANEOUS
  Filled 2016-10-14 (×5): qty 1

## 2016-10-14 MED ORDER — SODIUM CHLORIDE 0.9 % IV SOLN
1000.0000 mL | Freq: Once | INTRAVENOUS | Status: AC
Start: 2016-10-14 — End: 2016-10-14
  Administered 2016-10-14: 1000 mL via INTRAVENOUS

## 2016-10-14 MED ORDER — VITAMIN B-1 100 MG PO TABS: 50.0000 mg | ORAL_TABLET | Freq: Every day | ORAL | Status: DC

## 2016-10-14 MED ORDER — SODIUM BICARBONATE 8.4 % IV SOLN
INTRAVENOUS | Status: DC
  Administered 2016-10-14 – 2016-10-15 (×4): via INTRAVENOUS
  Filled 2016-10-14 (×9): qty 150

## 2016-10-14 MED ORDER — MAGNESIUM SULFATE 2 GM/50ML IV SOLN
2.0000 g | Freq: Once | INTRAVENOUS | Status: AC
  Administered 2016-10-14: 2 g via INTRAVENOUS
  Filled 2016-10-14: qty 50

## 2016-10-14 MED ORDER — ACETAMINOPHEN 325 MG PO TABS: 650.0000 mg | ORAL_TABLET | Freq: Four times a day (QID) | ORAL | Status: DC | PRN

## 2016-10-14 MED ORDER — ONDANSETRON HCL 4 MG/2ML IJ SOLN
4.0000 mg | Freq: Four times a day (QID) | INTRAMUSCULAR | Status: DC | PRN
  Administered 2016-10-14: 4 mg via INTRAVENOUS
  Filled 2016-10-14: qty 2

## 2016-10-14 MED ORDER — PROMETHAZINE HCL 25 MG/ML IJ SOLN
12.5000 mg | Freq: Four times a day (QID) | INTRAMUSCULAR | Status: DC | PRN
  Administered 2016-10-14: 12.5 mg via INTRAVENOUS
  Filled 2016-10-14: qty 1

## 2016-10-14 MED ORDER — MORPHINE SULFATE (PF) 4 MG/ML IV SOLN
4.0000 mg | Freq: Once | INTRAVENOUS | Status: AC
Start: 2016-10-14 — End: 2016-10-14
  Administered 2016-10-14: 4 mg via INTRAVENOUS
  Filled 2016-10-14: qty 1

## 2016-10-14 MED ORDER — FOLIC ACID 1 MG PO TABS
1.0000 mg | ORAL_TABLET | Freq: Every day | ORAL | Status: DC
  Administered 2016-10-16 – 2016-10-17 (×2): 1 mg via ORAL
  Filled 2016-10-14 (×2): qty 1

## 2016-10-14 MED ORDER — INSULIN ASPART 100 UNIT/ML ~~LOC~~ SOLN
0.0000 [IU] | SUBCUTANEOUS | Status: DC
  Administered 2016-10-15: 5 [IU] via SUBCUTANEOUS
  Administered 2016-10-15: 2 [IU] via SUBCUTANEOUS
  Administered 2016-10-15: 1 [IU] via SUBCUTANEOUS
  Administered 2016-10-15: 3 [IU] via SUBCUTANEOUS
  Administered 2016-10-15: 1 [IU] via SUBCUTANEOUS
  Filled 2016-10-14 (×5): qty 1

## 2016-10-14 MED ORDER — SODIUM BICARBONATE 8.4 % IV SOLN
200.0000 meq | Freq: Once | INTRAVENOUS | Status: AC
  Administered 2016-10-14: 200 meq via INTRAVENOUS
  Filled 2016-10-14: qty 200

## 2016-10-14 MED ORDER — SODIUM BICARBONATE 8.4 % IV SOLN
50.0000 meq | Freq: Once | INTRAVENOUS | Status: AC
  Administered 2016-10-14: 50 meq via INTRAVENOUS
  Filled 2016-10-14: qty 50

## 2016-10-14 MED ORDER — ONDANSETRON HCL 4 MG PO TABS: 4.0000 mg | ORAL_TABLET | Freq: Four times a day (QID) | ORAL | Status: DC | PRN

## 2016-10-14 MED ORDER — LORAZEPAM 2 MG/ML IJ SOLN
1.0000 mg | INTRAMUSCULAR | Status: DC | PRN
  Administered 2016-10-14: 2 mg via INTRAVENOUS

## 2016-10-14 MED ORDER — SODIUM CHLORIDE 0.9 % IV SOLN
Freq: Once | INTRAVENOUS | Status: AC
  Administered 2016-10-14: 16:00:00 via INTRAVENOUS

## 2016-10-14 MED ORDER — ADULT MULTIVITAMIN W/MINERALS CH
1.0000 | ORAL_TABLET | Freq: Every day | ORAL | Status: DC
  Administered 2016-10-16 – 2016-10-17 (×2): 1 via ORAL
  Filled 2016-10-14 (×2): qty 1

## 2016-10-14 MED ORDER — LORAZEPAM 2 MG/ML IJ SOLN
INTRAMUSCULAR | Status: AC
  Administered 2016-10-14: 2 mg via INTRAVENOUS
  Filled 2016-10-14: qty 1

## 2016-10-14 MED ORDER — DEXMEDETOMIDINE HCL IN NACL 400 MCG/100ML IV SOLN
0.4000 ug/kg/h | INTRAVENOUS | Status: DC
  Administered 2016-10-14: 1.2 ug/kg/h via INTRAVENOUS
  Administered 2016-10-15: 0.5 ug/kg/h via INTRAVENOUS
  Filled 2016-10-14 (×2): qty 100

## 2016-10-14 MED ORDER — PANTOPRAZOLE SODIUM 40 MG IV SOLR
40.0000 mg | INTRAVENOUS | Status: DC
  Administered 2016-10-14 – 2016-10-15 (×2): 40 mg via INTRAVENOUS
  Filled 2016-10-14 (×2): qty 40

## 2016-10-14 NOTE — ED Notes (Signed)
Pt to ultrasound. Will transport to ICU after ultrasound

## 2016-10-14 NOTE — ED Triage Notes (Signed)
Pt came to ED via EMS from home c/o not feeling well x2 weeks. Reports n/v, reports she feels impacted.

## 2016-10-14 NOTE — Progress Notes (Signed)
eLink Physician-Brief Progress Note Patient Name: Yvonne Scott DOB: [birth year] MRN: [id number]   Date of Service  10/14/2016  HPI/Events of Note  Admit with metabolic acidosis, elevated LA N/V, AKI On bicarb supplementation  eICU Interventions  Stable on cam check Follow repeat ABG     Intervention Category Evaluation Type: New Patient Evaluation  Aleksandr Pellow 10/14/2016, 7:37 PM

## 2016-10-14 NOTE — H&P (Signed)
[facility] [place] NAME: Yvonne Scott    MR#:  [id number]  DATE OF BIRTH:  1960-10-20  DATE OF ADMISSION:  10/14/2016  PRIMARY CARE PHYSICIAN: Arnetha Courser, MD   REQUESTING/REFERRING PHYSICIAN: Dr. Lenise Arena  CHIEF COMPLAINT:   Chief Complaint  Patient presents with  . Nausea    HISTORY OF PRESENT ILLNESS:  Yvonne Scott  is a 56 y.o. female with a known history of Anxiety, GERD, previous history of Metabolic acidosis who presents to the hospital due to nausea and vomiting and generally not feeling well. Patient presented to the ER was noted to be in severe metabolic acidosis but the etiology of the acidosis is unclear. Patient has no history of diabetes her sugars only mildly elevated. She does admit to alcohol abuse but not significantly high. She denies any abuse of ethylene glycol, methanol or Tylenol or aspirin. Presently she only complains of some persistent nausea vomiting and weakness and therefore presented to the ER for further evaluation. Patient denies any chest pains, shortness of breath, fever or chills cough or any other associated symptoms presently.  PAST MEDICAL HISTORY:   Past Medical History:  Diagnosis Date  . Anxiety   . GERD (gastroesophageal reflux disease)     PAST SURGICAL HISTORY:  History reviewed. No pertinent surgical history.  SOCIAL HISTORY:   Social History  Substance Use Topics  . Smoking status: Current Some Day Smoker    Packs/day: 1.00    Years: 36.00    Types: Cigarettes    Start date: 03/15/1979  . Smokeless tobacco: Never Used  . Alcohol use 1.8 oz/week    3 Standard drinks or equivalent per week     Comment: weekly    FAMILY HISTORY:   Family History  Problem Relation Age of Onset  . Cancer Mother 5       breast  . Head & neck cancer Father     DRUG ALLERGIES:  No Known Allergies  REVIEW OF SYSTEMS:   Review of Systems  Constitutional: Negative for fever  and weight loss.  HENT: Negative for congestion, nosebleeds and tinnitus.   Eyes: Negative for blurred vision, double vision and redness.  Respiratory: Negative for cough, hemoptysis and shortness of breath.   Cardiovascular: Negative for chest pain, orthopnea, leg swelling and PND.  Gastrointestinal: Positive for nausea and vomiting. Negative for abdominal pain, diarrhea and melena.  Genitourinary: Negative for dysuria, hematuria and urgency.  Musculoskeletal: Negative for falls and joint pain.  Neurological: Positive for weakness. Negative for dizziness, tingling, sensory change, focal weakness, seizures and headaches.  Endo/Heme/Allergies: Negative for polydipsia. Does not bruise/bleed easily.  Psychiatric/Behavioral: Negative for depression and memory loss. The patient is not nervous/anxious.     MEDICATIONS AT HOME:   Prior to Admission medications   Medication Sig Start Date End Date Taking? Authorizing Provider  omeprazole (PRILOSEC) 40 MG capsule Take 1 capsule (40 mg total) by mouth daily. 11/19/15  Yes Lada, Satira Anis, MD      VITAL SIGNS:  Blood pressure 113/61, pulse (!) 109, temperature 97.8 F (36.6 C), temperature source Axillary, resp. rate 17, height 5\' 2"  (1.575 m), weight 46.6 kg (102 lb 11.8 oz), last menstrual period 11/19/2002, SpO2 100 %.  PHYSICAL EXAMINATION:  Physical Exam  GENERAL:  56 y.o.-year-old patient lying in the bed in mild distress.   EYES: Pupils equal, round, reactive to light and accommodation. No scleral icterus. Extraocular muscles intact.  HEENT: Head  atraumatic, normocephalic. Oropharynx and nasopharynx clear. No oropharyngeal erythema, dry oral mucosa  NECK:  Supple, no jugular venous distention. No thyroid enlargement, no tenderness.  LUNGS: Normal breath sounds bilaterally, no wheezing, rales, rhonchi. No use of accessory muscles of respiration.  CARDIOVASCULAR: S1, S2 RRR, tachycardic. No murmurs, rubs, gallops, clicks.  ABDOMEN: Soft,  nontender, nondistended. Bowel sounds present. No organomegaly or mass.  EXTREMITIES: No pedal edema, cyanosis, or clubbing. + 2 pedal & radial pulses b/l.   NEUROLOGIC: Cranial nerves II through XII are intact. No focal Motor or sensory deficits appreciated b/l PSYCHIATRIC: The patient is alert and oriented x 3.  SKIN: No obvious rash, lesion, or ulcer.   LABORATORY PANEL:   CBC  Recent Labs Lab 10/14/16 1428  WBC 12.4*  HGB 15.8  HCT 49.0*  PLT 223   ------------------------------------------------------------------------------------------------------------------  Chemistries   Recent Labs Lab 10/14/16 1428  NA 136  K 4.1  CL 97*  CO2 7*  GLUCOSE 234*  BUN 11  CREATININE 1.26*  CALCIUM 10.0  AST 78*  ALT 63*  ALKPHOS 87  BILITOT 1.8*   ------------------------------------------------------------------------------------------------------------------  Cardiac Enzymes  Recent Labs Lab 10/14/16 1428  TROPONINI <0.03   ------------------------------------------------------------------------------------------------------------------  RADIOLOGY:  Dg Abd 2 Views  Result Date: 10/14/2016 CLINICAL DATA:  56 year old female with nausea and vomiting for the past week and generalized abdominal pain EXAM: ABDOMEN - 2 VIEW COMPARISON:  None. FINDINGS: The lung bases are clear. The cardiac mediastinal contours are normal in size. No evidence of free air. No organomegaly or abnormal calcification. The bowel gas pattern does not appear obstructed. Normal colonic stool burden. Vascular phleboliths project over the anatomic pelvis. Osseous structures are intact and unremarkable for age. IMPRESSION: Negative. Electronically Signed   By: Jacqulynn Cadet M.D.   On: 10/14/2016 14:25     IMPRESSION AND PLAN:   56 year old female with past medical history of GERD, alcohol abuse, previous history of an anion gap metabolic acidosis who presents to the hospital due to nausea vomiting  and noted to be in severe anion gap metabolic acidosis.  1. Severe anion gap metabolic acidosis-etiology unclear presently. Patient's blood sugars only mildly elevated. Lactate level is also mildly elevated. Suspected to be possibly related to alcohol abuse. -Await Tylenol, aspirin level, volatile studies including methanol, ethylene glycol. -Supportive care with IV fluids, bicarbonate drip. Follow serial metabolic profiles. -I will check a serum osmolality. Patient to be admitted to the stepdown. -Discussed the case with nephrology who will evaluate the patient.  2. Acute kidney injury-secondary to the nausea vomiting and metabolic acidosis. -Continue aggressive IV fluid hydration, follow BUN/creatinine urine output. -Pending nephrology consult.  3. Abnormal LFTs-secondary to alcohol abuse. -I'll check abdominal ultrasound. Follow LFTs. -I will check a PT/INR  4. GERD-continue IV Protonix.   All the records are reviewed and case discussed with ED provider. Management plans discussed with the patient, family and they are in agreement.  CODE STATUS: Full code  TOTAL Critical Care TIME TAKING CARE OF THIS PATIENT: 45 minutes.    Henreitta Leber M.D on 10/14/2016 at 5:16 PM  Between 7am to 6pm - Pager - [phone]  After 6pm go to [email or web address] - password [email or web address] [facility]  Office  [phone]  CC: Primary care physician; Arnetha Courser, MD

## 2016-10-14 NOTE — ED Provider Notes (Signed)
[facility] Emergency Department Provider Note       Time seen: ----------------------------------------- 1:58 PM on 10/14/2016 -----------------------------------------     I have reviewed the triage vital signs and the nursing notes.   HISTORY   Chief Complaint No chief complaint on file.    HPI Yvonne Scott is a 56 y.o. female who presents to the ED for sickness for the last 2 weeks. Patient reports significant nausea vomiting. She also has had some constipation and fecal impaction during the same time period. She denies fevers, chills or other complaints. She reports a generalized ill feeling at the moment. She has diffuse abdominal pain at this time. Patient states she drinks 2 or 3 Rum and Cokes daily.   Past Medical History:  Diagnosis Date  . Anxiety   . GERD (gastroesophageal reflux disease)     Patient Active Problem List   Diagnosis Date Noted  . Need for hepatitis C screening test 11/19/2015  . Medication monitoring encounter 11/19/2015  . GERD (gastroesophageal reflux disease) 11/19/2015  . Tobacco abuse 11/19/2015  . Sinusitis, acute maxillary 11/19/2015  . Elevated blood pressure (not hypertension) 11/19/2015    No past surgical history on file.  Allergies Patient has no known allergies.  Social History Social History  Substance Use Topics  . Smoking status: Current Some Day Smoker    Packs/day: 1.00    Years: 36.00    Types: Cigarettes    Start date: 03/15/1979  . Smokeless tobacco: Never Used  . Alcohol use 1.8 oz/week    3 Standard drinks or equivalent per week     Comment: weekly    Review of Systems Constitutional: Negative for fever. Cardiovascular: Negative for chest pain. Respiratory: Negative for shortness of breath. Gastrointestinal: Positive for abdominal pain, vomiting Genitourinary: Negative for dysuria. Musculoskeletal: Negative for back pain. Skin: Negative for rash. Neurological: Negative for  headaches, focal weakness or numbness.  All systems negative/normal/unremarkable except as stated in the HPI  ____________________________________________   PHYSICAL EXAM:  VITAL SIGNS: ED Triage Vitals  Enc Vitals Group     BP      Pulse      Resp      Temp      Temp src      SpO2      Weight      Height      Head Circumference      Peak Flow      Pain Score      Pain Loc      Pain Edu?      Excl. in [place]?     Constitutional: Alert and oriented. Well appearing and in no distress. Eyes: Conjunctivae are normal. Normal extraocular movements. ENT   Head: Normocephalic and atraumatic.   Nose: No congestion/rhinnorhea.   Mouth/Throat: Mucous membranes are moist.   Neck: No stridor. Cardiovascular: Normal rate, regular rhythm. No murmurs, rubs, or gallops. Respiratory: Normal respiratory effort without tachypnea nor retractions. Breath sounds are clear and equal bilaterally. No wheezes/rales/rhonchi. Gastrointestinal: Soft and nontender. Normal bowel sounds Musculoskeletal: Nontender with normal range of motion in extremities. No lower extremity tenderness nor edema. Neurologic:  Normal speech and language. No gross focal neurologic deficits are appreciated.  Skin:  Skin is warm, dry and intact. No rash noted. Psychiatric: Mood and affect are normal. Speech and behavior are normal.  ____________________________________________  EKG: Interpreted by me. Sinus tachycardia with a rate of 134 bpm, normal PR interval, normal QRS, long QT, nonspecific  ST and T-wave changes  ____________________________________________  ED COURSE:  Pertinent labs & imaging results that were available during my care of the patient were reviewed by me and considered in my medical decision making (see chart for details). Patient presents for abdominal pain, vomiting we will assess with labs and imaging as indicated.   Procedures ____________________________________________   LABS  (pertinent positives/negatives)  Labs Reviewed  CBC WITH DIFFERENTIAL/PLATELET - Abnormal; Notable for the following:       Result Value   WBC 12.4 (*)    HCT 49.0 (*)    MCV 104.5 (*)    Neutro Abs 10.2 (*)    All other components within normal limits  COMPREHENSIVE METABOLIC PANEL - Abnormal; Notable for the following:    Chloride 97 (*)    CO2 7 (*)    Glucose, Bld 234 (*)    Creatinine, Ser 1.26 (*)    Total Protein 9.9 (*)    Albumin 5.7 (*)    AST 78 (*)    ALT 63 (*)    Total Bilirubin 1.8 (*)    GFR calc non Af Amer 47 (*)    GFR calc Af Amer 54 (*)    Anion gap 32 (*)    All other components within normal limits  BLOOD GAS, VENOUS - Abnormal; Notable for the following:    pH, Ven 6.94 (*)    pCO2, Ven 28 (*)    Bicarbonate 6.0 (*)    Acid-base deficit 25.4 (*)    All other components within normal limits  LIPASE, BLOOD  TROPONIN I  LACTIC ACID, PLASMA  LACTIC ACID, PLASMA  ACETAMINOPHEN LEVEL  SALICYLATE LEVEL  ETHANOL  URINE DRUG SCREEN, QUALITATIVE ([facility] ONLY)  MAGNESIUM  PHOSPHORUS   CRITICAL CARE Performed by: Earleen Newport   Total critical care time: 30 minutes  Critical care time was exclusive of separately billable procedures and treating other patients.  Critical care was necessary to treat or prevent imminent or life-threatening deterioration.  Critical care was time spent personally by me on the following activities: development of treatment plan with patient and/or surrogate as well as nursing, discussions with consultants, evaluation of patient's response to treatment, examination of patient, obtaining history from patient or surrogate, ordering and performing treatments and interventions, ordering and review of laboratory studies, ordering and review of radiographic studies, pulse oximetry and re-evaluation of patient's condition.   RADIOLOGY Images were viewed by me  Abdomen 2 view FINDINGS: The lung bases are clear. The cardiac  mediastinal contours are normal in size. No evidence of free air. No organomegaly or abnormal calcification. The bowel gas pattern does not appear obstructed. Normal colonic stool burden. Vascular phleboliths project over the anatomic pelvis. Osseous structures are intact and unremarkable for age.  IMPRESSION: Negative. ____________________________________________  FINAL ASSESSMENT AND PLAN  Nausea and vomiting, acidemia, chronic alcohol abuse  Plan: Patient's labs and imaging were dictated above. Patient had presented for general ill feeling with nausea for some time. Patient was found to be markedly acidemic and was given dose of bicarbonate addition to receiving 2 L of fluid. This is likely alcoholic ketoacidosis. If unimproved she may require a bicarbonate drip which she has had in the past.   Earleen Newport, MD   Note: This note was generated in part or whole with voice recognition software. Voice recognition is usually quite accurate but there are transcription errors that can and very often do occur. I apologize for any typographical errors that  were not detected and corrected.     Earleen Newport, MD 10/14/16 [phone]

## 2016-10-14 NOTE — Progress Notes (Signed)
[facility], [place] 10/14/16  Subjective:   Patient presents for nausea and vomiting for past several days worse since Sunday.Patient states her last alcoholic drink was on Sunday. Drinks 1-2 "rum coke" 2-3 times per week per patient. Has not been able to keep anything down past 2-3 days.  Reports chills. No fevers.  Denies drinking rubbing alcohol or methanol No NSAIDs. Has mid epigastric pain. Mouth is dry. Asking for water  Objective:  Vital signs in last 24 hours:  Temp:  [97.8 F (36.6 C)] 97.8 F (36.6 C) (08/03 1532) Pulse Rate:  [100-128] 100 (08/03 1730) Resp:  [13-19] 13 (08/03 1730) BP: (104-160)/(61-104) 104/65 (08/03 1730) SpO2:  [100 %] 100 % (08/03 1730) Weight:  [46.6 kg (102 lb 11.8 oz)] 46.6 kg (102 lb 11.8 oz) (08/03 1405)  Weight change:  Filed Weights   10/14/16 1405  Weight: 46.6 kg (102 lb 11.8 oz)    Intake/Output:   No intake or output data in the 24 hours ending 10/14/16 1842   Physical Exam: General: Chronically ill aoppearing  HEENT Anicteric, dry oral mucus membranes  Neck supple  Pulm/lungs Clear b/l, normal breathing effort  CVS/Heart Regular, no rub or gallop  Abdomen:  Soft, mid epigastric tenderness  Extremities: No edema  Neurologic: Alert, oreinted  Skin: decreased turgor          Basic Metabolic Panel:   Recent Labs Lab 10/14/16 1428  NA 136  K 4.1  CL 97*  CO2 7*  GLUCOSE 234*  BUN 11  CREATININE 1.26*  CALCIUM 10.0     CBC:  Recent Labs Lab 10/14/16 1428  WBC 12.4*  NEUTROABS 10.2*  HGB 15.8  HCT 49.0*  MCV 104.5*  PLT 223     No results found for: HEPBSAG, HEPBSAB, HEPBIGM    Microbiology:  No results found for this or any previous visit (from the past 240 hour(s)).  Coagulation Studies: No results for input(s): LABPROT, INR in the last 72 hours.  Urinalysis: No results for input(s): COLORURINE, LABSPEC, PHURINE, GLUCOSEU, HGBUR, BILIRUBINUR, KETONESUR,  PROTEINUR, UROBILINOGEN, NITRITE, LEUKOCYTESUR in the last 72 hours.  Invalid input(s): APPERANCEUR    Imaging: US Abdomen Limited  Result Date: 10/14/2016 CLINICAL DATA:  Abnormal liver function tests. EXAM: ULTRASOUND ABDOMEN LIMITED RIGHT UPPER QUADRANT COMPARISON:  Abdomen CT dated 02/16/2014. FINDINGS: Gallbladder: No gallstones or wall thickening visualized. No sonographic Murphy sign noted by sonographer. Common bile duct: Diameter: 3.8 mm Liver: Diffusely echogenic. IMPRESSION: Diffusely echogenic liver, most likely due to interval steatosis. Chronic hepatitis can also have this appearance. No other findings to indicate that this represents cirrhosis. Otherwise, normal examination. Electronically Signed   By: Claudie Revering M.D.   On: 10/14/2016 18:25   Dg Abd 2 Views  Result Date: 10/14/2016 CLINICAL DATA:  56 year old female with nausea and vomiting for the past week and generalized abdominal pain EXAM: ABDOMEN - 2 VIEW COMPARISON:  None. FINDINGS: The lung bases are clear. The cardiac mediastinal contours are normal in size. No evidence of free air. No organomegaly or abnormal calcification. The bowel gas pattern does not appear obstructed. Normal colonic stool burden. Vascular phleboliths project over the anatomic pelvis. Osseous structures are intact and unremarkable for age. IMPRESSION: Negative. Electronically Signed   By: Jacqulynn Cadet M.D.   On: 10/14/2016 14:25     Medications:   .  sodium bicarbonate  infusion 1000 mL 125 mL/hr at 10/14/16 1702   . insulin aspart  0-9 Units Subcutaneous  Q4H  . pantoprazole (PROTONIX) IV  40 mg Intravenous Q24H     Assessment/ Plan:  56 y.o. female with h/o GERD presents with severe metabolic acidosis and ARF. She had similar presentation back in 2015 December  1. ARF 2. Severe high anion gap metabolic acidosis, lactic acidosis 3. Nausea and vomiting leading to dehydration 4. Gastritis - alcoholic vs other  Patient presents as an  interesting case. Upon presentation her serum pH was 6.94/28 which has improved to 7.21 with iv bicarb supplementation. Lactic acid level elevated. Etoh, tylenol and salicylate levels are negative Urinalysis is pending.  Differential diagnosis will include ingestion of methanol, ethanol, isopropyl alcohol, propylene glycol ,salicylate ingestion. Volatile alcohol screen is pending. Lipase is negative  Current illness likely is related to Gastritis causing nausea and vomiting leading to starvation ketosis  Plan Agree with iv bicarb supplementation Trial of oral clears Follow serial BMP and ABG No acute indication of HD at present. Will continue to follow with you       LOS: 0 [facility] 8/3/20186:42 PM  [place], [place]

## 2016-10-14 NOTE — Progress Notes (Signed)
Critical abg results were reported to M. Patria Mane NP [id number] not Delight Ovens NP.

## 2016-10-14 NOTE — Consult Note (Signed)
Name: Yvonne Scott MRN: [id number] DOB: [birth year]    ADMISSION DATE:  10/14/2016 CONSULTATION DATE: 10/14/2016  REFERRING MD :  Dr. Verdell Carmine  CHIEF COMPLAINT: Nausea   BRIEF PATIENT DESCRIPTION:  56 year old female admitted in 08/3 with severe anion gap metabolic acidosis, nausea and vomiting may be secondary to  gastritis, lactic acidosis, leukocytosis, transaminitis, and acute renal failure  SIGNIFICANT EVENTS  08/1-Pt admitted to the Stepdown Unit   STUDIES:  None   HISTORY OF PRESENT ILLNESS:   This is a 55 year old female with a past medical history of GERD, ETOH use, current everyday smoker (1PPD), anxiety, and severe metabolic acidosis.  She presented to [facility] ER 08/3 with complaints of severe nausea and vomiting, diffuse abdominal pain, constipation, and questionable fecal impaction onset of nausea and vomiting 2 weeks prior to presentation to ER. Per ER notes patient endorses drinking and 2-3 rum and Cokes weekly.  She denies use of illicit drugs, NSAIDs, methanol, ethanol, ethylene glycol, acetone, or isopropyl alcohol.  She denies sick contacts. In the ER lab results revealed the patient was in severe anion gap metabolic acidosis, acute renal failure, lactic acidosis, and leukocytosis.  She was subsequently admitted to the stepdown unit for further workup and treatment by hospitalist team PCCM consulted.  PAST MEDICAL HISTORY :   has a past medical history of Anxiety and GERD (gastroesophageal reflux disease).  has no past surgical history on file. Prior to Admission medications   Medication Sig Start Date End Date Taking? Authorizing Provider  omeprazole (PRILOSEC) 40 MG capsule Take 1 capsule (40 mg total) by mouth daily. 11/19/15  Yes Lada, Satira Anis, MD   No Known Allergies  FAMILY HISTORY:  family history includes Cancer (age of onset: 56) in her mother; Head & neck cancer in her father. SOCIAL HISTORY:  reports that she has been smoking Cigarettes.  She started  smoking about 37 years ago. She has a 36.00 pack-year smoking history. She has never used smokeless tobacco. She reports that she drinks about 1.8 oz of alcohol per week .  REVIEW OF SYSTEMS: Positives in BOLD  Constitutional: fever, chills, weight loss, malaise/fatigue and diaphoresis.  HENT: Negative for hearing loss, ear pain, nosebleeds, congestion, sore throat, neck pain, tinnitus and ear discharge.   Eyes: Negative for blurred vision, double vision, photophobia, pain, discharge and redness.  Respiratory: cough, hemoptysis, sputum production, shortness of breath, wheezing and stridor.   Cardiovascular: Negative for chest pain, palpitations, orthopnea, claudication, leg swelling and PND.  Gastrointestinal: heartburn, nausea, vomiting, abdominal pain, diarrhea, constipation, blood in stool and melena.  Genitourinary: Negative for dysuria, urgency, frequency, hematuria and flank pain.  Musculoskeletal: Negative for myalgias, back pain, joint pain and falls.  Skin: Negative for itching and rash.  Neurological: dizziness, tingling, tremors, sensory change, speech change, focal weakness, seizures, loss of consciousness, weakness and headaches.  Endo/Heme/Allergies: Negative for environmental allergies and polydipsia. Does not bruise/bleed easily.  SUBJECTIVE:  No complaints at this time  VITAL SIGNS: Temp:  [97.8 F (36.6 C)] 97.8 F (36.6 C) (08/03 1532) Pulse Rate:  [100-128] 100 (08/03 1730) Resp:  [13-19] 13 (08/03 1730) BP: (104-160)/(61-104) 104/65 (08/03 1730) SpO2:  [100 %] 100 % (08/03 1730) Weight:  [46.6 kg (102 lb 11.8 oz)] 46.6 kg (102 lb 11.8 oz) (08/03 1405)  PHYSICAL EXAMINATION: General: Caucasian female resting in bed, NAD Neuro: Alert and oriented, follows commands, PERRLA HEENT: Supple, no JVD, dry mucous membranes Cardiovascular: S1-S2, RRR, no M/R/G Lungs: clear throughout, even, non  labored Abdomen: Positive bowel sounds 4, epigastric tenderness, soft,  nondistended Musculoskeletal: Normal bulk and tone, no edema Skin: Intact no rashes or lesions   Recent Labs Lab 10/14/16 1428  NA 136  K 4.1  CL 97*  CO2 7*  BUN 11  CREATININE 1.26*  GLUCOSE 234*    Recent Labs Lab 10/14/16 1428  HGB 15.8  HCT 49.0*  WBC 12.4*  PLT 223   Dg Abd 2 Views  Result Date: 10/14/2016 CLINICAL DATA:  56 year old female with nausea and vomiting for the past week and generalized abdominal pain EXAM: ABDOMEN - 2 VIEW COMPARISON:  None. FINDINGS: The lung bases are clear. The cardiac mediastinal contours are normal in size. No evidence of free air. No organomegaly or abnormal calcification. The bowel gas pattern does not appear obstructed. Normal colonic stool burden. Vascular phleboliths project over the anatomic pelvis. Osseous structures are intact and unremarkable for age. IMPRESSION: Negative. Electronically Signed   By: Jacqulynn Cadet M.D.   On: 10/14/2016 14:25    ASSESSMENT / PLAN: Severe anion gap metabolic acidosis may be secondary to severe volume depletion Lactic acidosis Nausea and Vomiting may be secondary to Gastritis  Leukocytosis likely secondary to gastritis  Acute renal failure likely secondary to volume depletion Hyperglycemia Transaminitis secondary to ETOH abuse  Hx: GERD  P: Supplemental O2 to maintain O2 sats greater than 92% Sodium bicarbonate drip will increase rate to 200 ml/hr Stat ABG then repeat 1hr following rate increase  Trend WBC and monitor fever curve Trend PCT and lactic acid Nephrology consulted appreciate input Urine drug screen, urinalysis, serum osmolality, and pt/inr results pending Abdominal ultrasound pending Repeat CMP Continuous telemetry monitoring Subq heparin for DVT prophylaxis CBG's q4hr and SSI  CIWA protocol Folic acid, thiamine, and multivitamin Nicotine patch Smoking and ETOH abuse cessation counseling provided  Marda Stalker, [place] Pager [phone]  (please enter 7 digits) PCCM Consult Pager [phone] (please enter 7 digits)   PCCM ATTENDING ATTESTATION:  I have evaluated patient with the APP [place], reviewed database in its entirety and discussed care plan in detail. In addition, this patient was discussed on multidisciplinary rounds.   Important exam findings: Vitals:   10/15/16 1000 10/15/16 1100 10/15/16 1200 10/15/16 1300  BP: (!) 79/50 (!) 86/51 (!) 95/57 (!) 95/58  Pulse: 83 87 87 93  Resp: 17 17 19 18   Temp:    99.3 F (37.4 C)  TempSrc:    Axillary  SpO2: (!) 87% 94% 93% 95%  Weight:      Height:       Lethargic - improved after discontinuation of precedex NAD Oriented HEENT WNL No JVD No wheezes Reg, no M Abd soft, +BS No LE edema   Major problems addressed by PCCM team: Lethargy - improved after DC of dexmedetomidine Hypotension - improved after NS bolus and DC of dexmedetomidine Severe acidosis - over corrected with HCO3 Severe hyperkalemia exacerbated by HCO3 Alcohol abuse but no evidence of withdrawal presently CC of N/V  PLAN/REC: DC dexmedetomidine DC CIWA DC HCO3 infusion IVFs changed to LR + KCl Received 60 mEq KCl earlier today Recheck BMET later today and in AM 08/05 Monitor for evidence of EtOH W/D Clear liquids - advance as tolerated  Merton Border, MD PCCM service Mobile [phone] Pager [phone] 10/15/2016 1:30 PM

## 2016-10-15 DIAGNOSIS — E872 Acidosis: Principal | ICD-10-CM

## 2016-10-15 DIAGNOSIS — F1010 Alcohol abuse, uncomplicated: Secondary | ICD-10-CM

## 2016-10-15 DIAGNOSIS — E876 Hypokalemia: Secondary | ICD-10-CM

## 2016-10-15 DIAGNOSIS — I959 Hypotension, unspecified: Secondary | ICD-10-CM

## 2016-10-15 LAB — BASIC METABOLIC PANEL
Anion gap: 10 (ref 5–15)
CHLORIDE: 98 mmol/L — AB (ref 101–111)
CO2: 32 mmol/L (ref 22–32)
Calcium: 7.1 mg/dL — ABNORMAL LOW (ref 8.9–10.3)
Creatinine, Ser: 0.54 mg/dL (ref 0.44–1.00)
GFR calc non Af Amer: >60 mL/min (ref >60)
Glucose, Bld: 117 mg/dL — ABNORMAL HIGH (ref 65–99)
POTASSIUM: 2.3 mmol/L — AB (ref 3.5–5.1)
SODIUM: 140 mmol/L (ref 135–145)

## 2016-10-15 LAB — BLOOD GAS, ARTERIAL
Acid-base deficit: 0.5 mmol/L (ref 0.0–2.0)
Bicarbonate: 21.8 mmol/L (ref 20.0–28.0)
FIO2: 0.21
O2 Saturation: 96.3 %
PATIENT TEMPERATURE: 37
PH ART: 7.5 — AB (ref 7.350–7.450)
PO2 ART: 76 mmHg — AB (ref 83.0–108.0)
pCO2 arterial: 28 mmHg — ABNORMAL LOW (ref 32.0–48.0)

## 2016-10-15 LAB — GLUCOSE, CAPILLARY
GLUCOSE-CAPILLARY: 217 mg/dL — AB (ref 65–99)
GLUCOSE-CAPILLARY: 130 mg/dL — AB (ref 65–99)
GLUCOSE-CAPILLARY: 157 mg/dL — AB (ref 65–99)
Glucose-Capillary: 136 mg/dL — ABNORMAL HIGH (ref 65–99)
Glucose-Capillary: 145 mg/dL — ABNORMAL HIGH (ref 65–99)
Glucose-Capillary: 291 mg/dL — ABNORMAL HIGH (ref 65–99)
Glucose-Capillary: 39 mg/dL — CL (ref 65–99)
Glucose-Capillary: 50 mg/dL — ABNORMAL LOW (ref 65–99)

## 2016-10-15 LAB — CBC
HCT: 30.9 % — ABNORMAL LOW (ref 35.0–47.0)
Hemoglobin: 11 g/dL — ABNORMAL LOW (ref 12.0–16.0)
MCH: 34.6 pg — AB (ref 26.0–34.0)
MCHC: 35.7 g/dL (ref 32.0–36.0)
MCV: 96.9 fL (ref 80.0–100.0)
PLATELETS: 134 10*3/uL — AB (ref 150–440)
RBC: 3.19 MIL/uL — AB (ref 3.80–5.20)
RDW: 13.6 % (ref 11.5–14.5)
WBC: 4.7 10*3/uL (ref 3.6–11.0)

## 2016-10-15 LAB — MAGNESIUM: MAGNESIUM: 2 mg/dL (ref 1.7–2.4)

## 2016-10-15 LAB — PHOSPHORUS: Phosphorus: 3.3 mg/dL (ref 2.5–4.6)

## 2016-10-15 MED ORDER — POTASSIUM CHLORIDE 10 MEQ/100ML IV SOLN
10.0000 meq | INTRAVENOUS | Status: DC
Start: 2016-10-15 — End: 2016-10-15
  Administered 2016-10-15 (×4): 10 meq via INTRAVENOUS
  Filled 2016-10-15 (×6): qty 100

## 2016-10-15 MED ORDER — DEXTROSE 50 % IV SOLN
INTRAVENOUS | Status: AC
  Filled 2016-10-15: qty 50

## 2016-10-15 MED ORDER — DEXTROSE 50 % IV SOLN
25.0000 g | Freq: Once | INTRAVENOUS | Status: AC
  Administered 2016-10-15: 25 g via INTRAVENOUS

## 2016-10-15 MED ORDER — POTASSIUM CHLORIDE 2 MEQ/ML IV SOLN
INTRAVENOUS | Status: DC
  Administered 2016-10-15 (×2): via INTRAVENOUS
  Filled 2016-10-15 (×4): qty 1000

## 2016-10-15 MED ORDER — SODIUM CHLORIDE 0.9 % IV BOLUS (SEPSIS)
1000.0000 mL | Freq: Once | INTRAVENOUS | Status: AC
  Administered 2016-10-15: 1000 mL via INTRAVENOUS

## 2016-10-15 MED ORDER — POTASSIUM CHLORIDE 10 MEQ/100ML IV SOLN
10.0000 meq | INTRAVENOUS | Status: AC
Start: 2016-10-15 — End: 2016-10-16
  Administered 2016-10-15 (×6): 10 meq via INTRAVENOUS
  Filled 2016-10-15 (×6): qty 100

## 2016-10-15 MED ORDER — IBUPROFEN 400 MG PO TABS
400.0000 mg | ORAL_TABLET | Freq: Four times a day (QID) | ORAL | Status: DC | PRN
  Administered 2016-10-15: 400 mg via ORAL
  Filled 2016-10-15 (×2): qty 1

## 2016-10-15 NOTE — Progress Notes (Signed)
Pt. CBG charted under other pt. In error, CBG of 215 at 0430 being manually corrected by lab- rechecked and charted for epic purposes.

## 2016-10-15 NOTE — Progress Notes (Signed)
eLink Physician-Brief Progress Note Patient Name: Yvonne Scott DOB: [birth year] MRN: [id number]   Date of Service  10/15/2016  HPI/Events of Note  Patient c/o R soreness. Creatinine = 0.54.  eICU Interventions  Will order: 1. Motrin 400 mg PO Q 6 hours PRN headache, mild pain or moderate pain.      Intervention Category Intermediate Interventions: Pain - evaluation and management  Sommer,Steven Eugene 10/15/2016, 8:48 PM

## 2016-10-15 NOTE — Progress Notes (Signed)
CRITICAL VALUE ALERT  Critical Value:  K+ < 2.0   Date & Time Notified:  Recollected by lab, called at [place]  Provider Notified: Ms. Patria Mane, NP called at [phone] and notified  Orders Received/Actions taken: NP stated she will review labs and order K+ and other replacements as needed Will continue to monitor pt. closely

## 2016-10-15 NOTE — Progress Notes (Signed)
[facility] Progress Note Patient Name: Yvonne Scott DOB: [birth year] MRN: [id number]   Date of Service  10/15/2016  HPI/Events of Note  K+ = 2.3 and Creatinine = 0.54.  eICU Interventions  Will replace K+.      Intervention Category Major Interventions: Electrolyte abnormality - evaluation and management  Louisa Favaro Eugene 10/15/2016, 5:59 PM

## 2016-10-15 NOTE — Plan of Care (Signed)
Problem: Physical Regulation: Goal: Ability to maintain clinical measurements within normal limits will improve Outcome: Progressing Patient more awake and alert in afternoon. Potassium still low on recheck in evening, but E Link notified (see previous note). No complaints of nausea or vomiting. CIWA a 1 at noon and a 2 at 16:00.

## 2016-10-15 NOTE — Progress Notes (Signed)
MD Simonds at bedside, made aware of hypotension. Made aware Precedex was on hold to see if that improves hypotension due to patient being lethargic. No new orders at this time, will continue to monitor patient.

## 2016-10-15 NOTE — Progress Notes (Signed)
[facility], [place] 10/15/16  Subjective:  Patient is somnolent this morning Nursing staff reports agitation and DTs last night Electrolytes and renal function have improved but patient noted to have hypokalmia   Objective:  Vital signs in last 24 hours:  Temp:  [97.7 F (36.5 C)-98.1 F (36.7 C)] 98.1 F (36.7 C) (08/04 0736) Pulse Rate:  [73-128] 79 (08/04 0952) Resp:  [13-22] 18 (08/04 0952) BP: (72-160)/(41-104) 72/44 (08/04 0952) SpO2:  [86 %-100 %] 90 % (08/04 0952) Weight:  [46.6 kg (102 lb 11.8 oz)] 46.6 kg (102 lb 11.8 oz) (08/03 1405)  Weight change:  Filed Weights   10/14/16 1405  Weight: 46.6 kg (102 lb 11.8 oz)    Intake/Output:    Intake/Output Summary (Last 24 hours) at 10/15/16 1133 Last data filed at 10/15/16 1000  Gross per 24 hour  Intake          6082.99 ml  Output              385 ml  Net          5697.99 ml     Physical Exam: General: Chronically ill appearing  HEENT Anicteric, dry oral mucus membranes  Neck supple  Pulm/lungs Clear b/l  CVS/Heart Regular, no rub  Abdomen:  Soft, NT  Extremities: No edema  Neurologic: Lethargic but arousable  Skin: No acute rashes          Basic Metabolic Panel:   Recent Labs Lab 10/14/16 1428 10/14/16 1854 10/15/16 0437  NA 136 140 145  K 4.1 4.3 <2.0*  CL 97* 108 97*  CO2 7* 12* 35*  GLUCOSE 234* 127* 224*  BUN 11 8 5*  CREATININE 1.26* 0.98 0.53  CALCIUM 10.0 8.2* 7.4*  MG  --  1.5* 2.0  PHOS  --  1.3* 3.3     CBC:  Recent Labs Lab 10/14/16 1428 10/15/16 0437  WBC 12.4* 4.7  NEUTROABS 10.2*  --   HGB 15.8 11.0*  HCT 49.0* 30.9*  MCV 104.5* 96.9  PLT 223 134*     No results found for: HEPBSAG, HEPBSAB, HEPBIGM    Microbiology:  No results found for this or any previous visit (from the past 240 hour(s)).  Coagulation Studies:  Recent Labs  10/14/16 1854  LABPROT 13.7  INR 1.05    Urinalysis:  Recent Labs  10/14/16 2035   COLORURINE YELLOW*  LABSPEC 1.013  PHURINE 6.0  GLUCOSEU 50*  HGBUR MODERATE*  BILIRUBINUR NEGATIVE  KETONESUR 80*  PROTEINUR 100*  NITRITE NEGATIVE  LEUKOCYTESUR NEGATIVE      Imaging: US Abdomen Limited  Result Date: 10/14/2016 CLINICAL DATA:  Abnormal liver function tests. EXAM: ULTRASOUND ABDOMEN LIMITED RIGHT UPPER QUADRANT COMPARISON:  Abdomen CT dated 02/16/2014. FINDINGS: Gallbladder: No gallstones or wall thickening visualized. No sonographic Murphy sign noted by sonographer. Common bile duct: Diameter: 3.8 mm Liver: Diffusely echogenic. IMPRESSION: Diffusely echogenic liver, most likely due to interval steatosis. Chronic hepatitis can also have this appearance. No other findings to indicate that this represents cirrhosis. Otherwise, normal examination. Electronically Signed   By: Claudie Revering M.D.   On: 10/14/2016 18:25   Dg Chest Port 1 View  Result Date: 10/14/2016 CLINICAL DATA:  55 year old female with chronic cough and nausea vomiting. EXAM: PORTABLE CHEST 1 VIEW COMPARISON:  Chest radiograph dated 02/16/2014 FINDINGS: The heart size and mediastinal contours are within normal limits. Both lungs are clear. The visualized skeletal structures are unremarkable. IMPRESSION: No active disease. Electronically  Signed   By: Anner Crete M.D.   On: 10/14/2016 19:28   Dg Abd 2 Views  Result Date: 10/14/2016 CLINICAL DATA:  56 year old female with nausea and vomiting for the past week and generalized abdominal pain EXAM: ABDOMEN - 2 VIEW COMPARISON:  None. FINDINGS: The lung bases are clear. The cardiac mediastinal contours are normal in size. No evidence of free air. No organomegaly or abnormal calcification. The bowel gas pattern does not appear obstructed. Normal colonic stool burden. Vascular phleboliths project over the anatomic pelvis. Osseous structures are intact and unremarkable for age. IMPRESSION: Negative. Electronically Signed   By: Jacqulynn Cadet M.D.   On: 10/14/2016  14:25     Medications:   . lactated ringers with kcl 75 mL/hr at 10/15/16 0922  . potassium chloride 10 mEq (10/15/16 0914)   . folic acid  1 mg Oral Daily  . heparin  5,000 Units Subcutaneous Q8H  . insulin aspart  0-9 Units Subcutaneous Q4H  . multivitamin with minerals  1 tablet Oral Daily  . nicotine  14 mg Transdermal Daily  . pantoprazole (PROTONIX) IV  40 mg Intravenous Q24H  . thiamine  50 mg Oral Daily   [DISCONTINUED] ondansetron **OR** ondansetron (ZOFRAN) IV, promethazine  Assessment/ Plan:  56 y.o. female with h/o GERD presents with severe metabolic acidosis and ARF. She had similar presentation back in 2015 December  1. ARF 2. Severe high anion gap metabolic acidosis, lactic acidosis 3. Nausea and vomiting leading to dehydration 4. Gastritis - alcoholic vs other 5. Hypokalemia 6. Hematuria 7. Proteinuria  Patient presents as an interesting case. Upon presentation her serum pH was 6.94/28 which has improved to 7.21 with iv bicarb supplementation. Lactic acid level elevated. Etoh, tylenol and salicylate levels are negative Urinalysis shows blood and protein   Current illness likely is related to Gastritis causing nausea and vomiting leading to starvation ketosis  Plan Renal function and electrolytes have improved Replace potassium PRN Repeat U/A and urine P/C ratio tomorrow.  If hematuria and proteinuria persist, will obtain further work up   LOS: 1 Yvonne Scott 8/4/201811:33 AM  [facility] [place], [place]

## 2016-10-15 NOTE — Progress Notes (Signed)
Hypoglycemic Event  CBG: 50 @ 2347  Treatment: OJ- 120 mL  Symptoms: None  Follow-up CBG: Time: 0017 CBG Result: 110  Possible Reasons for Event: Unknown  Comments/MD notified: pt. Asymptomatic, tolerated juice without complication.  Hypoglycemic event during day shift- low of  39, pt. On sensitive scale with CBG checks Q 4, will continue to monitor pt. Closely.    Domingo Pulse [place]

## 2016-10-15 NOTE — Progress Notes (Signed)
Hypoglycemic Event  CBG: 39  Treatment: D50 IV 50 mL  Symptoms: none  Follow-up CBG: Time: 1225 CBG Result: 136  Possible Reasons for Event: Patient too lethargic to eat breakfast per report from [employer] (hypoglycemia event recognized when this RN was getting report from previous RN, Casey Burkitt).   [place], [place]

## 2016-10-15 NOTE — Progress Notes (Signed)
CRITICAL VALUE ALERT  Critical Value:  K 2.3  Date & Time Notied:  10/15/16 17:48  Provider Notified: Leda Gauze RN (E Link RN)  Orders Received/Actions taken: E Link RN to notify Margaree Mackintosh MD.  Patient had received 40 meq IV piggyback, has LR with 40 meq of potasium in it, Magnesium this morning was 2.0, no ectopy noted on cardiac monitor. Team will continue to monitor.

## 2016-10-15 NOTE — Progress Notes (Signed)
Confirmed with Dr. Alva Garnet that MD did not want RN to give last two potassium IV piggyback bags (patient has received four so far for 40 meq and has potassium in maintenance fluids). MD confirmed scheduled metabolic panel this afternoon. Team will continue to monitor.

## 2016-10-15 NOTE — Progress Notes (Signed)
One of pt.'s PIV's infiltrated, PIV removed- this RN & B.B., RN attempted to place 2nd PIV due to need from multiple IV meds and compatibility- without success Ms. Patria Mane, NP called bedside, as pt. Became increasingly agitated and began to have visual & auditory hallucinations- no longer following commands.  Ms. Patria Mane, NP placed 2 PIV's after 2 unsuccessful attempts, precedex ordered- will continue to monitor pt. Closely.

## 2016-10-15 NOTE — Progress Notes (Signed)
[facility] NAME: Yvonne Scott    MR#:  [id number]  DATE OF BIRTH:  [birth year]  SUBJECTIVE:  CHIEF COMPLAINT:   Chief Complaint  Patient presents with  . Nausea     Came with nausea, found to have metabolic acidosis, no clear source.   On bicarb drip, acidosis corrected- but now alkalotic.  REVIEW OF SYSTEMS:   Constitutional: Negative for fever and weight loss.  HENT: Negative for congestion, nosebleeds and tinnitus.   Eyes: Negative for blurred vision, double vision and redness.  Respiratory: Negative for cough, hemoptysis and shortness of breath.   Cardiovascular: Negative for chest pain, orthopnea, leg swelling and PND.  Gastrointestinal: Positive for nausea and vomiting. Negative for abdominal pain, diarrhea and melena.  Genitourinary: Negative for dysuria, hematuria and urgency.  Musculoskeletal: Negative for falls and joint pain.  Neurological: Positive for weakness. Negative for dizziness, tingling, sensory change, focal weakness, seizures and headaches.  Endo/Heme/Allergies: Negative for polydipsia. Does not bruise/bleed easily.  Psychiatric/Behavioral: Negative for depression and memory loss. The patient is not nervous/anxious.   ROS  DRUG ALLERGIES:  No Known Allergies  VITALS:  Blood pressure (!) 93/56, pulse 95, temperature 98.9 F (37.2 C), temperature source Oral, resp. rate (!) 24, height 5\' 2"  (1.575 m), weight 46.6 kg (102 lb 11.8 oz), last menstrual period 11/19/2002, SpO2 94 %.  PHYSICAL EXAMINATION:   GENERAL:  56 y.o.-year-old patient lying in the bed in mild distress.   EYES: Pupils equal, round, reactive to light and accommodation. No scleral icterus. Extraocular muscles intact.  HEENT: Head atraumatic, normocephalic. Oropharynx and nasopharynx clear. No oropharyngeal erythema, dry oral mucosa  NECK:  Supple, no jugular venous distention. No thyroid enlargement, no tenderness.  LUNGS: Normal  breath sounds bilaterally, no wheezing, rales, rhonchi. No use of accessory muscles of respiration.  CARDIOVASCULAR: S1, S2 RRR, tachycardic. No murmurs, rubs, gallops, clicks.  ABDOMEN: Soft, nontender, nondistended. Bowel sounds present. No organomegaly or mass.  EXTREMITIES: No pedal edema, cyanosis, or clubbing. + 2 pedal & radial pulses b/l.   NEUROLOGIC: Cranial nerves II through XII are intact. No focal Motor or sensory deficits appreciated b/l PSYCHIATRIC: The patient is alert and oriented x 3.  SKIN: No obvious rash, lesion, or ulcer.  Physical Exam LABORATORY PANEL:   CBC  Recent Labs Lab 10/15/16 0437  WBC 4.7  HGB 11.0*  HCT 30.9*  PLT 134*   ------------------------------------------------------------------------------------------------------------------  Chemistries   Recent Labs Lab 10/15/16 0437 10/15/16 1656  NA 145 140  K <2.0* 2.3*  CL 97* 98*  CO2 35* 32  GLUCOSE 224* 117*  BUN 5* <5*  CREATININE 0.53 0.54  CALCIUM 7.4* 7.1*  MG 2.0  --   AST 42*  --   ALT 33  --   ALKPHOS 46  --   BILITOT 1.3*  --    ------------------------------------------------------------------------------------------------------------------  Cardiac Enzymes  Recent Labs Lab 10/14/16 1428  TROPONINI <0.03   ------------------------------------------------------------------------------------------------------------------  RADIOLOGY:  US Abdomen Limited  Result Date: 10/14/2016 CLINICAL DATA:  Abnormal liver function tests. EXAM: ULTRASOUND ABDOMEN LIMITED RIGHT UPPER QUADRANT COMPARISON:  Abdomen CT dated 02/16/2014. FINDINGS: Gallbladder: No gallstones or wall thickening visualized. No sonographic Murphy sign noted by sonographer. Common bile duct: Diameter: 3.8 mm Liver: Diffusely echogenic. IMPRESSION: Diffusely echogenic liver, most likely due to interval steatosis. Chronic hepatitis can also have this appearance. No other findings to indicate that this represents  cirrhosis. Otherwise, normal examination. Electronically Signed  By: Claudie Revering M.D.   On: 10/14/2016 18:25   Dg Chest Port 1 View  Result Date: 10/14/2016 CLINICAL DATA:  56 year old female with chronic cough and nausea vomiting. EXAM: PORTABLE CHEST 1 VIEW COMPARISON:  Chest radiograph dated 02/16/2014 FINDINGS: The heart size and mediastinal contours are within normal limits. Both lungs are clear. The visualized skeletal structures are unremarkable. IMPRESSION: No active disease. Electronically Signed   By: Anner Crete M.D.   On: 10/14/2016 19:28   Dg Abd 2 Views  Result Date: 10/14/2016 CLINICAL DATA:  56 year old female with nausea and vomiting for the past week and generalized abdominal pain EXAM: ABDOMEN - 2 VIEW COMPARISON:  None. FINDINGS: The lung bases are clear. The cardiac mediastinal contours are normal in size. No evidence of free air. No organomegaly or abnormal calcification. The bowel gas pattern does not appear obstructed. Normal colonic stool burden. Vascular phleboliths project over the anatomic pelvis. Osseous structures are intact and unremarkable for age. IMPRESSION: Negative. Electronically Signed   By: Jacqulynn Cadet M.D.   On: 10/14/2016 14:25    ASSESSMENT AND PLAN:   Active Problems:   Metabolic acidosis  56 year old female with past medical history of GERD, alcohol abuse, previous history of an anion gap metabolic acidosis who presents to the hospital due to nausea vomiting and noted to be in severe anion gap metabolic acidosis.  1. Severe anion gap metabolic acidosis-etiology unclear presently. Patient's blood sugars only mildly elevated. Lactate level is also mildly elevated. Suspected to be possibly related to alcohol abuse. - checkedt Tylenol, aspirin level, volatile studies including methanol, ethylene glycol. All are not high. -Supportive care with IV fluids, bicarbonate drip. Follow serial metabolic profiles. -I will check a serum osmolality.  Patient to be admitted to the stepdown. -Discussed the case with nephrology who will evaluate the patient.  2. Acute kidney injury-secondary to the nausea vomiting and metabolic acidosis. -Continue aggressive IV fluid hydration, follow BUN/creatinine urine output. - appreciated nephrology consult.  3. Abnormal LFTs-secondary to alcohol abuse. - checked abdominal ultrasound. No abnormalities. - check a PT/INR  4. GERD-continue IV Protonix.  5. Hypokalemia   Replace IV , recheck.   All the records are reviewed and case discussed with Care Management/Social Workerr. Management plans discussed with the patient, family and they are in agreement.  CODE STATUS: Full.  TOTAL TIME TAKING CARE OF THIS PATIENT: 35 minutes.     POSSIBLE D/C IN 1-2 DAYS, DEPENDING ON CLINICAL CONDITION.   Vaughan Basta M.D on 10/15/2016   Between 7am to 6pm - Pager - [phone]  After 6pm go to [email or web address] - password [email or web address] [facility]  Office  [phone]  CC: Primary care physician; Arnetha Courser, MD  Note: This dictation was prepared with Dragon dictation along with smaller phrase technology. Any transcriptional errors that result from this process are unintentional.

## 2016-10-15 NOTE — Progress Notes (Signed)
Pt. VSS O/N, bicarb & precedex running (see MAR for details) CIWA Q 4, recent score 8. Report given to [facility].

## 2016-10-15 NOTE — Plan of Care (Signed)
Problem: Physical Regulation: Goal: Will remain free from infection Temperature increasing since AM, Tmax 99.6 axillary. Room temperature however had been set the max, so RN adjusted to middle level per patient request.  Problem: Nutrition: Goal: Adequate nutrition will be maintained Outcome: Progressing Patient had hypoglycemia event middle of the day but had not eaten today anything prior to that due to lethargy. Since more awake after this RN took over for [employer], patient has eaten roughly 50% of clear liquid trays including juice. CBG in afternoon 130.

## 2016-10-16 DIAGNOSIS — N179 Acute kidney failure, unspecified: Secondary | ICD-10-CM

## 2016-10-16 LAB — COMPREHENSIVE METABOLIC PANEL
ALT: 33 U/L (ref 14–54)
ANION GAP: 13 (ref 5–15)
AST: 42 U/L — ABNORMAL HIGH (ref 15–41)
Albumin: 3.2 g/dL — ABNORMAL LOW (ref 3.5–5.0)
Alkaline Phosphatase: 46 U/L (ref 38–126)
BUN: 5 mg/dL — ABNORMAL LOW (ref 6–20)
CHLORIDE: 97 mmol/L — AB (ref 101–111)
CO2: 35 mmol/L — AB (ref 22–32)
CREATININE: 0.53 mg/dL (ref 0.44–1.00)
Calcium: 7.4 mg/dL — ABNORMAL LOW (ref 8.9–10.3)
Glucose, Bld: 224 mg/dL — ABNORMAL HIGH (ref 65–99)
SODIUM: 145 mmol/L (ref 135–145)
Total Bilirubin: 1.3 mg/dL — ABNORMAL HIGH (ref 0.3–1.2)
Total Protein: 5.5 g/dL — ABNORMAL LOW (ref 6.5–8.1)

## 2016-10-16 LAB — PROTEIN / CREATININE RATIO, URINE: CREATININE, URINE: 36 mg/dL

## 2016-10-16 LAB — GLUCOSE, CAPILLARY
GLUCOSE-CAPILLARY: 119 mg/dL — AB (ref 65–99)
Glucose-Capillary: 110 mg/dL — ABNORMAL HIGH (ref 65–99)
Glucose-Capillary: 134 mg/dL — ABNORMAL HIGH (ref 65–99)
Glucose-Capillary: 85 mg/dL (ref 65–99)
Glucose-Capillary: 94 mg/dL (ref 65–99)

## 2016-10-16 LAB — URINALYSIS, ROUTINE W REFLEX MICROSCOPIC
BILIRUBIN URINE: NEGATIVE
Glucose, UA: NEGATIVE mg/dL
Hgb urine dipstick: NEGATIVE
KETONES UR: NEGATIVE mg/dL
Leukocytes, UA: NEGATIVE
NITRITE: NEGATIVE
PROTEIN: NEGATIVE mg/dL
Specific Gravity, Urine: 1.003 — ABNORMAL LOW (ref 1.005–1.030)
pH: 7 (ref 5.0–8.0)

## 2016-10-16 LAB — CBC
HCT: 32.6 % — ABNORMAL LOW (ref 35.0–47.0)
HEMOGLOBIN: 11.5 g/dL — AB (ref 12.0–16.0)
MCH: 34.1 pg — ABNORMAL HIGH (ref 26.0–34.0)
MCHC: 35.2 g/dL (ref 32.0–36.0)
MCV: 96.9 fL (ref 80.0–100.0)
PLATELETS: 116 10*3/uL — AB (ref 150–440)
RBC: 3.37 MIL/uL — ABNORMAL LOW (ref 3.80–5.20)
RDW: 13.9 % (ref 11.5–14.5)
WBC: 6.4 10*3/uL (ref 3.6–11.0)

## 2016-10-16 LAB — BASIC METABOLIC PANEL
ANION GAP: 6 (ref 5–15)
CALCIUM: 7.2 mg/dL — AB (ref 8.9–10.3)
CO2: 32 mmol/L (ref 22–32)
CREATININE: 0.54 mg/dL (ref 0.44–1.00)
Chloride: 103 mmol/L (ref 101–111)
GFR calc Af Amer: >60 mL/min (ref >60)
GLUCOSE: 88 mg/dL (ref 65–99)
Potassium: 3.3 mmol/L — ABNORMAL LOW (ref 3.5–5.1)
Sodium: 141 mmol/L (ref 135–145)

## 2016-10-16 LAB — PROCALCITONIN

## 2016-10-16 LAB — HIV ANTIBODY (ROUTINE TESTING W REFLEX): HIV Screen 4th Generation wRfx: NONREACTIVE

## 2016-10-16 MED ORDER — PANTOPRAZOLE SODIUM 40 MG PO TBEC
40.0000 mg | DELAYED_RELEASE_TABLET | Freq: Every day | ORAL | Status: DC
Start: 2016-10-16 — End: 2016-10-17
  Administered 2016-10-16 – 2016-10-17 (×2): 40 mg via ORAL
  Filled 2016-10-16 (×2): qty 1

## 2016-10-16 MED ORDER — VITAMIN B-1 100 MG PO TABS
100.0000 mg | ORAL_TABLET | Freq: Every day | ORAL | Status: DC
  Administered 2016-10-16 – 2016-10-17 (×2): 100 mg via ORAL
  Filled 2016-10-16 (×2): qty 1

## 2016-10-16 MED ORDER — ENOXAPARIN SODIUM 40 MG/0.4ML ~~LOC~~ SOLN
40.0000 mg | SUBCUTANEOUS | Status: DC
  Administered 2016-10-16 – 2016-10-17 (×2): 40 mg via SUBCUTANEOUS
  Filled 2016-10-16 (×2): qty 0.4

## 2016-10-16 MED ORDER — POTASSIUM CHLORIDE 10 MEQ/100ML IV SOLN
10.0000 meq | INTRAVENOUS | Status: AC
  Administered 2016-10-16 (×4): 10 meq via INTRAVENOUS
  Filled 2016-10-16 (×6): qty 100

## 2016-10-16 NOTE — Progress Notes (Signed)
[facility] [place] NAME: Yvonne Scott    MR#:  [id number]  DATE OF BIRTH:  [birth year]  SUBJECTIVE:  CHIEF COMPLAINT:   Chief Complaint  Patient presents with  . Nausea     Came with nausea, found to have metabolic acidosis, no clear source.   On bicarb drip, acidosis corrected- electrolytes improved much.    Said- had nausea and epigastric pain for last few days and vomiting, but better now.  REVIEW OF SYSTEMS:   Constitutional: Negative for fever and weight loss.  HENT: Negative for congestion, nosebleeds and tinnitus.   Eyes: Negative for blurred vision, double vision and redness.  Respiratory: Negative for cough, hemoptysis and shortness of breath.   Cardiovascular: Negative for chest pain, orthopnea, leg swelling and PND.  Gastrointestinal: Positive for nausea and vomiting. Negative for abdominal pain, diarrhea and melena.  Genitourinary: Negative for dysuria, hematuria and urgency.  Musculoskeletal: Negative for falls and joint pain.  Neurological: Positive for weakness. Negative for dizziness, tingling, sensory change, focal weakness, seizures and headaches.  Endo/Heme/Allergies: Negative for polydipsia. Does not bruise/bleed easily.  Psychiatric/Behavioral: Negative for depression and memory loss. The patient is not nervous/anxious.   ROS  DRUG ALLERGIES:  No Known Allergies  VITALS:  Blood pressure 110/82, pulse (!) 111, temperature 98.2 F (36.8 C), temperature source Oral, resp. rate 17, height 5\' 2"  (1.575 m), weight 46.6 kg (102 lb 11.8 oz), last menstrual period 11/19/2002, SpO2 93 %.  PHYSICAL EXAMINATION:   GENERAL:  56 y.o.-year-old patient lying in the bed in mild distress.   EYES: Pupils equal, round, reactive to light and accommodation. No scleral icterus. Extraocular muscles intact.  HEENT: Head atraumatic, normocephalic. Oropharynx and nasopharynx clear. No oropharyngeal erythema, dry oral mucosa  NECK:   Supple, no jugular venous distention. No thyroid enlargement, no tenderness.  LUNGS: Normal breath sounds bilaterally, no wheezing, rales, rhonchi. No use of accessory muscles of respiration.  CARDIOVASCULAR: S1, S2 RRR, tachycardic. No murmurs, rubs, gallops, clicks.  ABDOMEN: Soft, nontender, nondistended. Bowel sounds present. No organomegaly or mass.  EXTREMITIES: No pedal edema, cyanosis, or clubbing. + 2 pedal & radial pulses b/l.   NEUROLOGIC: Cranial nerves II through XII are intact. No focal Motor or sensory deficits appreciated b/l PSYCHIATRIC: The patient is alert and oriented x 3.  SKIN: No obvious rash, lesion, or ulcer.  Physical Exam LABORATORY PANEL:   CBC  Recent Labs Lab 10/16/16 0449  WBC 6.4  HGB 11.5*  HCT 32.6*  PLT 116*   ------------------------------------------------------------------------------------------------------------------  Chemistries   Recent Labs Lab 10/15/16 0437  10/16/16 0449  NA 145  < > 141  K <2.0*  < > 3.3*  CL 97*  < > 103  CO2 35*  < > 32  GLUCOSE 224*  < > 88  BUN 5*  < > <5*  CREATININE 0.53  < > 0.54  CALCIUM 7.4*  < > 7.2*  MG 2.0  --   --   AST 42*  --   --   ALT 33  --   --   ALKPHOS 46  --   --   BILITOT 1.3*  --   --   < > = values in this interval not displayed. ------------------------------------------------------------------------------------------------------------------  Cardiac Enzymes  Recent Labs Lab 10/14/16 1428  TROPONINI <0.03   ------------------------------------------------------------------------------------------------------------------  RADIOLOGY:  US Abdomen Limited  Result Date: 10/14/2016 CLINICAL DATA:  Abnormal liver function tests. EXAM: ULTRASOUND ABDOMEN LIMITED RIGHT UPPER  QUADRANT COMPARISON:  Abdomen CT dated 02/16/2014. FINDINGS: Gallbladder: No gallstones or wall thickening visualized. No sonographic Murphy sign noted by sonographer. Common bile duct: Diameter: 3.8 mm Liver:  Diffusely echogenic. IMPRESSION: Diffusely echogenic liver, most likely due to interval steatosis. Chronic hepatitis can also have this appearance. No other findings to indicate that this represents cirrhosis. Otherwise, normal examination. Electronically Signed   By: Claudie Revering M.D.   On: 10/14/2016 18:25   Dg Chest Port 1 View  Result Date: 10/14/2016 CLINICAL DATA:  56 year old female with chronic cough and nausea vomiting. EXAM: PORTABLE CHEST 1 VIEW COMPARISON:  Chest radiograph dated 02/16/2014 FINDINGS: The heart size and mediastinal contours are within normal limits. Both lungs are clear. The visualized skeletal structures are unremarkable. IMPRESSION: No active disease. Electronically Signed   By: Anner Crete M.D.   On: 10/14/2016 19:28   Dg Abd 2 Views  Result Date: 10/14/2016 CLINICAL DATA:  56 year old female with nausea and vomiting for the past week and generalized abdominal pain EXAM: ABDOMEN - 2 VIEW COMPARISON:  None. FINDINGS: The lung bases are clear. The cardiac mediastinal contours are normal in size. No evidence of free air. No organomegaly or abnormal calcification. The bowel gas pattern does not appear obstructed. Normal colonic stool burden. Vascular phleboliths project over the anatomic pelvis. Osseous structures are intact and unremarkable for age. IMPRESSION: Negative. Electronically Signed   By: Jacqulynn Cadet M.D.   On: 10/14/2016 14:25    ASSESSMENT AND PLAN:   Active Problems:   Metabolic acidosis  56 year old female with past medical history of GERD, alcohol abuse, previous history of an anion gap metabolic acidosis who presents to the hospital due to nausea vomiting and noted to be in severe anion gap metabolic acidosis.  1. Severe anion gap metabolic acidosis-etiology likely vomiting due to gastritis, alcohol abuse?  Patient's blood sugars only mildly elevated. Lactate level is also mildly elevated. Suspected to be possibly related to alcohol abuse. -  checked Tylenol, aspirin level, volatile studies including methanol, ethylene glycol. All are not high. -Supportive care with IV fluids, bicarbonate drip. Follow serial metabolic profiles. - serum osmolality. Patient to be admitted to the stepdown. -Discussed the case with nephrology now improved.  2. Acute kidney injury-secondary to the nausea vomiting and metabolic acidosis. -Continue aggressive IV fluid hydration, follow BUN/creatinine urine output. - appreciated nephrology consult. Improved.  3. Abnormal LFTs-secondary to alcohol abuse. - checked abdominal ultrasound. No abnormalities. - checked a PT/INR  4. GERD-continue IV Protonix.  5. Hypokalemia   Replace IV , recheck.  6. Hypoglycemia    Stopped insulin, on dextrose drip.   All the records are reviewed and case discussed with Care Management/Social Workerr. Management plans discussed with the patient, family and they are in agreement.  CODE STATUS: Full.  TOTAL TIME TAKING CARE OF THIS PATIENT: 35 minutes.    POSSIBLE D/C IN 1-2 DAYS, DEPENDING ON CLINICAL CONDITION.   Vaughan Basta M.D on 10/16/2016   Between 7am to 6pm - Pager - [phone]  After 6pm go to [email or web address] - password [email or web address] [facility]  Office  [phone]  CC: Primary care physician; Arnetha Courser, MD  Note: This dictation was prepared with Dragon dictation along with smaller phrase technology. Any transcriptional errors that result from this process are unintentional.

## 2016-10-16 NOTE — Progress Notes (Signed)
[facility] Progress Note Patient Name: Yvonne Scott DOB: [birth year] MRN: [id number]   Date of Service  10/16/2016  HPI/Events of Note  hypokalemia  eICU Interventions  Potassium replaced     Intervention Category Intermediate Interventions: Electrolyte abnormality - evaluation and management  DETERDING,ELIZABETH 10/16/2016, 5:41 AM

## 2016-10-16 NOTE — Progress Notes (Signed)
More alert. Conversant. Oriented. Reports that nausea is improved. Feels weak still. No new complaints.  Vitals:   10/16/16 0300 10/16/16 0400 10/16/16 0500 10/16/16 0600  BP: (!) 84/54 104/68 108/69 115/71  Pulse: 78 77 89 96  Resp: 17 18 17  (!) 22  Temp:      TempSrc:      SpO2: 95% 95% 95% 90%  Weight:      Height:        Chronically ill-appearing NAD HEENT WNL No JVD  Chest clear anteriorly Regular, no M NABS, NT Extremities warm, no edema No focal neurologic deficits  BMP Latest Ref Rng & Units 10/16/2016 10/15/2016 10/15/2016  Glucose 65 - 99 mg/dL 88 117(H) 224(H)  BUN 6 - 20 mg/dL <5(L) <5(L) 5(L)  Creatinine 0.44 - 1.00 mg/dL 0.54 0.54 0.53  Sodium 135 - 145 mmol/L 141 140 145  Potassium 3.5 - 5.1 mmol/L 3.3(L) 2.3(LL) <2.0(LL)  Chloride 101 - 111 mmol/L 103 98(L) 97(L)  CO2 22 - 32 mmol/L 32 32 35(H)  Calcium 8.9 - 10.3 mg/dL 7.2(L) 7.1(L) 7.4(L)    CBC Latest Ref Rng & Units 10/16/2016 10/15/2016 10/14/2016  WBC 3.6 - 11.0 K/uL 6.4 4.7 12.4(H)  Hemoglobin 12.0 - 16.0 g/dL 11.5(L) 11.0(L) 15.8  Hematocrit 35.0 - 47.0 % 32.6(L) 30.9(L) 49.0(H)  Platelets 150 - 440 K/uL 116(L) 134(L) 223    No new CXR  IMPRESSION: Nausea/vomiting - improving Severe volume depletion, resolved Hypotension, resolved Anion gap metabolic acidosis - resolved AKI, resolved Hypokalemia History of heavy alcohol use - no evidence of withdrawal  PLAN/REC: Advance diet and activity Monitor chemistries Transfer to MedSurg After transfer, PCCM will sign off. Please call if we can be of further assistance    Merton Border, MD PCCM service Mobile [phone] Pager [phone] 10/16/2016 11:13 AM

## 2016-10-17 LAB — BASIC METABOLIC PANEL
ANION GAP: 8 (ref 5–15)
BUN: <5 mg/dL — ABNORMAL LOW (ref 6–20)
CALCIUM: 8.5 mg/dL — AB (ref 8.9–10.3)
CHLORIDE: 106 mmol/L (ref 101–111)
CO2: 29 mmol/L (ref 22–32)
Creatinine, Ser: 0.48 mg/dL (ref 0.44–1.00)
GFR calc non Af Amer: >60 mL/min (ref >60)
Glucose, Bld: 97 mg/dL (ref 65–99)
Potassium: 3.7 mmol/L (ref 3.5–5.1)
Sodium: 143 mmol/L (ref 135–145)

## 2016-10-17 LAB — GLUCOSE, CAPILLARY
GLUCOSE-CAPILLARY: 43 mg/dL — AB (ref 65–99)
GLUCOSE-CAPILLARY: 96 mg/dL (ref 65–99)
Glucose-Capillary: 101 mg/dL — ABNORMAL HIGH (ref 65–99)
Glucose-Capillary: 134 mg/dL — ABNORMAL HIGH (ref 65–99)
Glucose-Capillary: 87 mg/dL (ref 65–99)

## 2016-10-17 MED ORDER — GUAIFENESIN ER 600 MG PO TB12: 1200.0000 mg | ORAL_TABLET | Freq: Two times a day (BID) | ORAL | 0 refills | Status: DC

## 2016-10-17 MED ORDER — FLUTICASONE PROPIONATE 50 MCG/ACT NA SUSP
1.0000 | Freq: Every day | NASAL | Status: DC
  Administered 2016-10-17: 09:00:00 1 via NASAL
  Filled 2016-10-17: qty 16

## 2016-10-17 MED ORDER — LORATADINE 10 MG PO TABS
10.0000 mg | ORAL_TABLET | Freq: Every day | ORAL | Status: DC
  Administered 2016-10-17: 09:00:00 10 mg via ORAL
  Filled 2016-10-17: qty 1

## 2016-10-17 MED ORDER — FLUTICASONE PROPIONATE 50 MCG/ACT NA SUSP: 1.0000 | Freq: Every day | NASAL | 1 refills | Status: DC

## 2016-10-17 MED ORDER — LORATADINE 10 MG PO TABS: 10.0000 mg | ORAL_TABLET | Freq: Every day | ORAL | 2 refills | Status: DC

## 2016-10-17 MED ORDER — GUAIFENESIN ER 600 MG PO TB12
1200.0000 mg | ORAL_TABLET | Freq: Two times a day (BID) | ORAL | Status: DC
  Administered 2016-10-17: 09:00:00 1200 mg via ORAL
  Filled 2016-10-17: qty 2

## 2016-10-17 MED ORDER — PANTOPRAZOLE SODIUM 40 MG PO TBEC: 40.0000 mg | DELAYED_RELEASE_TABLET | Freq: Two times a day (BID) | ORAL | 2 refills | Status: DC

## 2016-10-17 NOTE — Progress Notes (Signed)
[facility] at [place] NAME: Yvonne Scott    MR#:  [id number]  DATE OF BIRTH:  31-Aug-1960  SUBJECTIVE:  CHIEF COMPLAINT:   Chief Complaint  Patient presents with  . Nausea   - Complains of weakness. Also has significant cough, congestion and feels awful  REVIEW OF SYSTEMS:  Review of Systems  Constitutional: Positive for malaise/fatigue. Negative for chills and fever.  HENT: Negative for ear discharge, ear pain, hearing loss and nosebleeds.   Eyes: Negative for blurred vision and double vision.  Respiratory: Positive for cough and sputum production. Negative for shortness of breath and wheezing.   Cardiovascular: Negative for chest pain, palpitations and leg swelling.  Gastrointestinal: Negative for abdominal pain, constipation, diarrhea, nausea and vomiting.  Genitourinary: Negative for dysuria.  Musculoskeletal: Negative for myalgias.  Neurological: Negative for dizziness, speech change, focal weakness, seizures and headaches.  Psychiatric/Behavioral: Negative for depression.    DRUG ALLERGIES:  No Known Allergies  VITALS:  Blood pressure 117/78, pulse 93, temperature 98 F (36.7 C), temperature source Oral, resp. rate 14, height 5\' 2"  (1.575 m), weight 61 kg (134 lb 6.4 oz), last menstrual period 11/19/2002, SpO2 96 %.  PHYSICAL EXAMINATION:  Physical Exam  GENERAL:  56 y.o.-year-old patient lying in the bed with no acute distress.  EYES: Pupils equal, round, reactive to light and accommodation. No scleral icterus. Extraocular muscles intact.  HEENT: Head atraumatic, normocephalic. Oropharynx and nasopharynx clear.  NECK:  Supple, no jugular venous distention. No thyroid enlargement, no tenderness. Congestion more in the upper airways, lungs are clear. LUNGS: Normal breath sounds bilaterally, no wheezing, rales,rhonchi or crepitation. No use of accessory muscles of respiration. Decreased bibasilar breath  sounds CARDIOVASCULAR: S1, S2 normal. No murmurs, rubs, or gallops.  ABDOMEN: Soft, nontender, nondistended. Bowel sounds present. No organomegaly or mass.  EXTREMITIES: No pedal edema, cyanosis, or clubbing.  NEUROLOGIC: Cranial nerves II through XII are intact. Muscle strength 5/5 in all extremities. Sensation intact. Gait not checked.  PSYCHIATRIC: The patient is alert and oriented x 3.  SKIN: No obvious rash, lesion, or ulcer.    LABORATORY PANEL:   CBC  Recent Labs Lab 10/16/16 0449  WBC 6.4  HGB 11.5*  HCT 32.6*  PLT 116*   ------------------------------------------------------------------------------------------------------------------  Chemistries   Recent Labs Lab 10/15/16 0437  10/17/16 0516  NA 145  < > 143  K <2.0*  < > 3.7  CL 97*  < > 106  CO2 35*  < > 29  GLUCOSE 224*  < > 97  BUN 5*  < > <5*  CREATININE 0.53  < > 0.48  CALCIUM 7.4*  < > 8.5*  MG 2.0  --   --   AST 42*  --   --   ALT 33  --   --   ALKPHOS 46  --   --   BILITOT 1.3*  --   --   < > = values in this interval not displayed. ------------------------------------------------------------------------------------------------------------------  Cardiac Enzymes  Recent Labs Lab 10/14/16 1428  TROPONINI <0.03   ------------------------------------------------------------------------------------------------------------------  RADIOLOGY:  No results found.  EKG:   Orders placed or performed during the hospital encounter of 10/14/16  . ED EKG  . ED EKG    ASSESSMENT AND PLAN:   56 year old female with past medical history significant for smoking, alcohol use, GERD, anxiety presented to hospital secondary to nausea and vomiting and noted to have severe metabolic acidosis.  #1 non-anion gap  metabolic acidosis-secondary to GI losses with nausea, vomiting on presentation. -Feels like her phlegm triggers her nausea and vomiting. -Also significant congestion and sinus issues. -She was  in ICU requiring bicarbonate drip for her acidosis, anion gap has resolved at this time. -Able to tolerate a solid diet at this time. -Medications added for her congestion. -Constipation is resolved. Outpatient follow-up recommended.  #2 hypoglycemia-fluctuating blood sugars while in ICU. Now able to tolerated a regular diet. -Insulin and C-peptide levels are ordered and pending. Will be followed by PCP. -We will give glucose tablets at discharge. Sugars will be checked every 4 hours while in the hospital. Explained to patient how to identify early symptoms with hypoglycemia and appropriate intervention at the time.  #3 GERD-continue Protonix at discharge  #4 tobacco use disorder-strongly counseled. Nicotine patch  #5 DVT prophylaxis-Lovenox  Physical therapy consult pending. Anticipate discharge today if stable     All the records are reviewed and case discussed with Care Management/Social Workerr. Management plans discussed with the patient, family and they are in agreement.  CODE STATUS: Full code  TOTAL TIME TAKING CARE OF THIS PATIENT: 38 minutes.   POSSIBLE D/C today or tomorrow, DEPENDING ON CLINICAL CONDITION.   Rino Hosea M.D on 10/17/2016 at 9:32 AM  Between 7am to 6pm - Pager - [phone]  After 6pm go to [email or web address] - password [email or web address] [facility]  Office  [phone]  CC: Primary care physician; Arnetha Courser, MD

## 2016-10-17 NOTE — Progress Notes (Signed)
Patient CBG at bedtime was 119. Due to getting in report that the patient's blood sugar may drop, rechecked blood sugar and CBG was 87 around midnight. Patient complained of very mild dizziness but after eating it seem to go away.  Provided a sandwich to ensure that her blood sugar does not drop during the night and educated patient that she may need to continue to eat a bedtime snack to prevent a low sugar in the morning. Patient understood. Will continue to monitor patient to end of shift.

## 2016-10-17 NOTE — Evaluation (Signed)
Physical Therapy Evaluation Patient Details Name: Yvonne Scott MRN: [id number] DOB: 07/14/1960 Today's Date: 10/17/2016   History of Present Illness  Pt is a 56 y.o.femalewith a known history of Anxiety, GERD, previous history of Metabolic acidosis who presents to the hospital due to nausea and vomiting and generally not feeling well. Patient presented to the ER was noted to be in severe metabolic acidosis but the etiology of the acidosis is unclear. Patient has no history of diabetes her sugars only mildly elevated. She does admit to alcohol abuse but not significantly high. She denies any abuse of ethylene glycol, methanol or Tylenol or aspirin. Presently she only complains of some persistent nausea vomiting and weakness and therefore presented to the ER for further evaluation. Patient denies any chest pains, shortness of breath, fever or chills cough or any other associated symptoms presently.    Clinical Impression  Pt independent with bed mobility, transfers, and gait including ascending/descending 3 steps with one rail.  Pt steady with gait/steps and performed very well during balance assessment including able to achieve > 10 sec on SLS test on both RLE and LLE.  Pt's HR in the high 90s at rest and increased to mid 120's after amb/stair assessment without symptoms, nursing notified.  Pt appears at baseline functionally at this time with pt stating does not wish to receive further PT services.  Will complete orders at this time but will reassess pending change in status upon receipt of new PT orders.     Follow Up Recommendations No PT follow up    Equipment Recommendations  None recommended by PT    Recommendations for Other Services       Precautions / Restrictions Precautions Precautions: None Restrictions Weight Bearing Restrictions: No      Mobility  Bed Mobility Overal bed mobility: Independent                Transfers Overall transfer level: Independent                   Ambulation/Gait Ambulation/Gait assistance: Independent Ambulation Distance (Feet): 200 Feet Assistive device: None Gait Pattern/deviations: WFL(Within Functional Limits)   Gait velocity interpretation: at or above normal speed for age/gender General Gait Details: Steady amb with good cadence, HR increased from baseline of high 90's to 120's without symptoms, nursing notified  Stairs Stairs: Yes Stairs assistance: Independent Stair Management: One rail Right Number of Stairs: 3 General stair comments: Pt steady ascending/descending stairs with only one rail, will have B rails at home  Wheelchair Mobility    Modified Rankin (Stroke Patients Only)       Balance Overall balance assessment: Independent                                           Pertinent Vitals/Pain Pain Assessment: No/denies pain    Home Living Family/patient expects to be discharged to:: Private residence Living Arrangements: Non-relatives/Friends Available Help at Discharge: Friend(s) Type of Home: House Home Access: Stairs to enter Entrance Stairs-Rails: Right;Left;Can reach both   Home Layout: One level Home Equipment: Environmental consultant - 2 wheels;Walker - 4 wheels;Cane - single point      Prior Function Level of Independence: Independent               Hand Dominance   Dominant Hand: Right    Extremity/Trunk Assessment   Upper Extremity Assessment Upper  Extremity Assessment: Overall WFL for tasks assessed    Lower Extremity Assessment Lower Extremity Assessment: Overall WFL for tasks assessed       Communication   Communication: No difficulties  Cognition Arousal/Alertness: Awake/alert Behavior During Therapy: WFL for tasks assessed/performed Overall Cognitive Status: Within Functional Limits for tasks assessed                                        General Comments General comments (skin integrity, edema, etc.): Pt steady with  balance assessment including SLS times of > 10 sec on BLEs    Exercises     Assessment/Plan    PT Assessment Patent does not need any further PT services  PT Problem List         PT Treatment Interventions      PT Goals (Current goals can be found in the Care Plan section)  Acute Rehab PT Goals Patient Stated Goal: To get back home PT Goal Formulation: With patient Potential to Achieve Goals: Good    Frequency     Barriers to discharge        Co-evaluation               AM-PAC PT "6 Clicks" Daily Activity  Outcome Measure Difficulty turning over in bed (including adjusting bedclothes, sheets and blankets)?: None Difficulty moving from lying on back to sitting on the side of the bed? : None Difficulty sitting down on and standing up from a chair with arms (e.g., wheelchair, bedside commode, etc,.)?: None Help needed moving to and from a bed to chair (including a wheelchair)?: None Help needed walking in hospital room?: None Help needed climbing 3-5 steps with a railing? : None 6 Click Score: 24    End of Session Equipment Utilized During Treatment: Gait belt Activity Tolerance: Patient tolerated treatment well Patient left: in bed;with bed alarm set;with call bell/phone within reach Nurse Communication: Mobility status PT Visit Diagnosis: Muscle weakness (generalized) (M62.81)    Time: 9924-2683 PT Time Calculation (min) (ACUTE ONLY): 21 min   Charges:   PT Evaluation $PT Eval Low Complexity: 1 Low     PT G Codes:        DRoyetta Asal PT, DPT 10/17/16, 12:29 PM

## 2016-10-17 NOTE — Discharge Summary (Signed)
[name] NAME: Yvonne Scott    MR#:  [id number]  DATE OF BIRTH:  28-Sep-1960  DATE OF ADMISSION:  10/14/2016   ADMITTING PHYSICIAN: Henreitta Leber, MD  DATE OF DISCHARGE: 10/17/2016 11:12 AM  PRIMARY CARE PHYSICIAN: Arnetha Courser, MD   ADMISSION DIAGNOSIS:   Nausea [R11.0] Chronic alcohol abuse [F10.10] High anion gap metabolic acidosis [V56.4] Abnormal LFTs [R94.5]  DISCHARGE DIAGNOSIS:   Active Problems:   Metabolic acidosis   SECONDARY DIAGNOSIS:   Past Medical History:  Diagnosis Date  . Anxiety   . GERD (gastroesophageal reflux disease)     HOSPITAL COURSE:   56 year old female with past medical history significant for smoking, alcohol use, GERD, anxiety presented to hospital secondary to nausea and vomiting and noted to have severe metabolic acidosis.  #1 non-anion gap metabolic acidosis-secondary to GI losses with nausea, vomiting on presentation. -Feels like her phlegm triggers her nausea and vomiting. -Also significant congestion and sinus issues. -She was in ICU requiring bicarbonate drip for her acidosis, anion gap has resolved at this time. -Able to tolerate a solid diet at this time. -Medications added for her congestion. -Constipation is resolved. Outpatient follow-up recommended.  #2 hypoglycemia-fluctuating blood sugars while in ICU. Now able to tolerated a regular diet. -Insulin and C-peptide levels are ordered and pending. Will be followed by PCP. -advised to take glucose tablets as needed at discharge. Sugars are stable now. - Explained to patient how to identify early symptoms with hypoglycemia and appropriate intervention at the time.  #3 GERD-continue Protonix at discharge  #4 tobacco use disorder-strongly counseled. Nicotine patchWhile in the hospital  Worked well with physical therapy in the hospital. Will be discharged home today   DISCHARGE CONDITIONS:    Guarded CONSULTS OBTAINED:    intensivist consult while in ICU  DRUG ALLERGIES:   No Known Allergies DISCHARGE MEDICATIONS:   Allergies as of 10/17/2016   No Known Allergies     Medication List    STOP taking these medications   omeprazole 40 MG capsule Commonly known as:  PRILOSEC     TAKE these medications   fluticasone 50 MCG/ACT nasal spray Commonly known as:  FLONASE Place 1 spray into both nostrils daily.   guaiFENesin 600 MG 12 hr tablet Commonly known as:  MUCINEX Take 2 tablets (1,200 mg total) by mouth 2 (two) times daily.   loratadine 10 MG tablet Commonly known as:  CLARITIN Take 1 tablet (10 mg total) by mouth daily.   pantoprazole 40 MG tablet Commonly known as:  PROTONIX Take 1 tablet (40 mg total) by mouth 2 (two) times daily.        DISCHARGE INSTRUCTIONS:   1. PCP follow-up in 1- 2 weeks  DIET:   Regular diet  ACTIVITY:   Activity as tolerated  OXYGEN:   Home Oxygen: No.  Oxygen Delivery: room air  DISCHARGE LOCATION:   home   If you experience worsening of your admission symptoms, develop shortness of breath, life threatening emergency, suicidal or homicidal thoughts you must seek medical attention immediately by calling 911 or calling your MD immediately  if symptoms less severe.  You Must read complete instructions/literature along with all the possible adverse reactions/side effects for all the Medicines you take and that have been prescribed to you. Take any new Medicines after you have completely understood and accpet all the possible adverse reactions/side effects.   Please note  You were cared for  by a hospitalist during your hospital stay. If you have any questions about your discharge medications or the care you received while you were in the hospital after you are discharged, you can call the unit and asked to speak with the hospitalist on call if the hospitalist that took care of you is not available. Once you are  discharged, your primary care physician will handle any further medical issues. Please note that NO REFILLS for any discharge medications will be authorized once you are discharged, as it is imperative that you return to your primary care physician (or establish a relationship with a primary care physician if you do not have one) for your aftercare needs so that they can reassess your need for medications and monitor your lab values.    On the day of Discharge:  VITAL SIGNS:   Blood pressure 117/78, pulse 98, temperature 98 F (36.7 C), temperature source Oral, resp. rate 14, height 5\' 2"  (1.575 m), weight 61 kg (134 lb 6.4 oz), last menstrual period 11/19/2002, SpO2 98 %.  PHYSICAL EXAMINATION:    GENERAL:  56 y.o.-year-old patient lying in the bed with no acute distress.  EYES: Pupils equal, round, reactive to light and accommodation. No scleral icterus. Extraocular muscles intact.  HEENT: Head atraumatic, normocephalic. Oropharynx and nasopharynx clear.  NECK:  Supple, no jugular venous distention. No thyroid enlargement, no tenderness. Congestion more in the upper airways, lungs are clear. LUNGS: Normal breath sounds bilaterally, no wheezing, rales,rhonchi or crepitation. No use of accessory muscles of respiration. Decreased bibasilar breath sounds CARDIOVASCULAR: S1, S2 normal. No murmurs, rubs, or gallops.  ABDOMEN: Soft, nontender, nondistended. Bowel sounds present. No organomegaly or mass.  EXTREMITIES: No pedal edema, cyanosis, or clubbing.  NEUROLOGIC: Cranial nerves II through XII are intact. Muscle strength 5/5 in all extremities. Sensation intact. Gait not checked.  PSYCHIATRIC: The patient is alert and oriented x 3.  SKIN: No obvious rash, lesion, or ulcer.   DATA REVIEW:   CBC  Recent Labs Lab 10/16/16 0449  WBC 6.4  HGB 11.5*  HCT 32.6*  PLT 116*    Chemistries   Recent Labs Lab 10/15/16 0437  10/17/16 0516  NA 145  < > 143  K <2.0*  < > 3.7  CL 97*  < >  106  CO2 35*  < > 29  GLUCOSE 224*  < > 97  BUN 5*  < > <5*  CREATININE 0.53  < > 0.48  CALCIUM 7.4*  < > 8.5*  MG 2.0  --   --   AST 42*  --   --   ALT 33  --   --   ALKPHOS 46  --   --   BILITOT 1.3*  --   --   < > = values in this interval not displayed.   Microbiology Results  Results for orders placed or performed in visit on 02/16/14  Culture, blood (single)     Status: None   Collection Time: 02/16/14  7:24 AM  Result Value Ref Range Status   Micro Text Report   Final       COMMENT                   NO GROWTH AEROBICALLY/ANAEROBICALLY IN 5 DAYS   ANTIBIOTIC  Culture, blood (single)     Status: None   Collection Time: 02/16/14  7:24 AM  Result Value Ref Range Status   Micro Text Report   Final       COMMENT                   NO GROWTH AEROBICALLY/ANAEROBICALLY IN 5 DAYS   ANTIBIOTIC                                                      Urine culture     Status: None   Collection Time: 02/16/14  7:24 AM  Result Value Ref Range Status   Micro Text Report   Final       SOURCE: CLEAN CATCH    COMMENT                   NO GROWTH IN 18-24 HOURS   ANTIBIOTIC                                                        RADIOLOGY:  No results found.   Management plans discussed with the patient, family and they are in agreement.  CODE STATUS:  Code Status History    Date Active Date Inactive Code Status Order ID Comments User Context   10/14/2016  6:42 PM 10/17/2016  2:18 PM Full Code [id number]  Henreitta Leber, MD Inpatient      TOTAL TIME TAKING CARE OF THIS PATIENT: 37 minutes.    Massa Pe M.D on 10/17/2016 at 2:54 PM  Between 7am to 6pm - Pager - [phone]  After 6pm go to [email or web address] - Proofreader  [facility]  Office  [phone]  CC: Primary care physician; Arnetha Courser, MD   Note: This dictation was prepared with Dragon dictation along with  smaller phrase technology. Any transcriptional errors that result from this process are unintentional.

## 2016-10-17 NOTE — Progress Notes (Signed)
Pt being discharged home, discharge instructions and prescriptions reviewed with pt, states understanding, pt with no complaints at discharge 

## 2016-10-18 ENCOUNTER — Telehealth: Payer: Self-pay

## 2016-10-18 LAB — BLOOD GAS, ARTERIAL
Bicarbonate: UNDETERMINED mmol/L (ref 20.0–28.0)
FIO2: 0.21
PATIENT TEMPERATURE: 37
PCO2 ART: 19 mmHg — AB (ref 32.0–48.0)
PCO2 ART: 19 mmHg — AB (ref 32.0–48.0)
PH ART: 7.21 — AB (ref 7.350–7.450)
PH ART: 7.23 — AB (ref 7.350–7.450)
PO2 ART: 104 mmHg (ref 83.0–108.0)
PO2 ART: 104 mmHg (ref 83.0–108.0)
Patient temperature: 37

## 2016-10-18 LAB — VOLATILES,BLD-ACETONE,ETHANOL,ISOPROP,METHANOL
Acetone, blood: 0.04 % — ABNORMAL HIGH (ref 0.000–0.010)
Ethanol, blood: NEGATIVE % (ref 0.000–0.010)
Isopropanol, blood: NEGATIVE % (ref 0.000–0.010)
METHANOL, BLOOD: NEGATIVE % (ref 0.000–0.010)

## 2016-10-18 LAB — INSULIN AND C-PEPTIDE, SERUM
C PEPTIDE: 2.3 ng/mL (ref 1.1–4.4)
Insulin: 2.5 u[IU]/mL — ABNORMAL LOW (ref 2.6–24.9)

## 2016-10-19 NOTE — Telephone Encounter (Signed)
Transition Care Management Follow-Up Telephone Call   Date discharged and where: 10/17/16 from [facility]  How have you been since you were released from the hospital?  Weak, dizzy and having back pain (LLQ). Pt states pain level is an 8 when up and moving, then a 0 when applying pressure . Pt also states she has swollen feet, ankles and her right arm (from b/p cuff). Pt denies n/v/d or fever.  Any patient concerns? More explanation of diagnosis.  Items Reviewed:   Meds: clarified  Allergies: clarified  Dietary Changes Reviewed: None, only advised to eat sugar when she feels dizzy and before she goes to bed.  Functional Questionnaire:  Independent-I Dependent-D  ADLs:   Dressing- I    Eating- I   Maintaining continence- I   Transferring- I   Transportation- D   Meal Prep- I   Managing Meds-  I  Confirmed importance and Date/Time of follow-up visits scheduled: 10/24/16 @ 2:40 PM   Confirmed with patient if condition worsens to call PCP or go to the Emergency Dept. Patient was given office number and encouraged to call back with questions or concerns:

## 2016-10-24 ENCOUNTER — Encounter: Payer: Self-pay | Admitting: Family Medicine

## 2016-10-24 ENCOUNTER — Ambulatory Visit (INDEPENDENT_AMBULATORY_CARE_PROVIDER_SITE_OTHER): Payer: Self-pay | Admitting: Family Medicine

## 2016-10-24 VITALS — BP 110/68 | HR 104 | Temp 97.7°F | Resp 16 | Wt 116.0 lb

## 2016-10-24 DIAGNOSIS — K760 Fatty (change of) liver, not elsewhere classified: Secondary | ICD-10-CM

## 2016-10-24 DIAGNOSIS — D696 Thrombocytopenia, unspecified: Secondary | ICD-10-CM

## 2016-10-24 DIAGNOSIS — I700 Atherosclerosis of aorta: Secondary | ICD-10-CM | POA: Insufficient documentation

## 2016-10-24 DIAGNOSIS — D649 Anemia, unspecified: Secondary | ICD-10-CM

## 2016-10-24 DIAGNOSIS — R195 Other fecal abnormalities: Secondary | ICD-10-CM

## 2016-10-24 DIAGNOSIS — K219 Gastro-esophageal reflux disease without esophagitis: Secondary | ICD-10-CM

## 2016-10-24 DIAGNOSIS — R233 Spontaneous ecchymoses: Secondary | ICD-10-CM

## 2016-10-24 DIAGNOSIS — Z720 Tobacco use: Secondary | ICD-10-CM

## 2016-10-24 DIAGNOSIS — R1033 Periumbilical pain: Secondary | ICD-10-CM

## 2016-10-24 DIAGNOSIS — E872 Acidosis, unspecified: Secondary | ICD-10-CM

## 2016-10-24 HISTORY — DX: Atherosclerosis of aorta: I70.0

## 2016-10-24 LAB — CBC WITH DIFFERENTIAL/PLATELET
BASOS ABS: 65 {cells}/uL (ref 0–200)
Basophils Relative: 1 %
EOS PCT: 2 %
Eosinophils Absolute: 130 cells/uL (ref 15–500)
HCT: 35.4 % (ref 35.0–45.0)
Hemoglobin: 12 g/dL (ref 11.7–15.5)
LYMPHS ABS: 2080 {cells}/uL (ref 850–3900)
LYMPHS PCT: 32 %
MCH: 33.2 pg — AB (ref 27.0–33.0)
MCHC: 33.9 g/dL (ref 32.0–36.0)
MCV: 98.1 fL (ref 80.0–100.0)
MONOS PCT: 10 %
MPV: 10.3 fL (ref 7.5–12.5)
Monocytes Absolute: 650 cells/uL (ref 200–950)
NEUTROS PCT: 55 %
Neutro Abs: 3575 cells/uL (ref 1500–7800)
PLATELETS: 439 10*3/uL — AB (ref 140–400)
RBC: 3.61 MIL/uL — AB (ref 3.80–5.10)
RDW: 14 % (ref 11.0–15.0)
WBC: 6.5 10*3/uL (ref 3.8–10.8)

## 2016-10-24 LAB — COMPLETE METABOLIC PANEL WITH GFR
ALBUMIN: 4.3 g/dL (ref 3.6–5.1)
ALK PHOS: 67 U/L (ref 33–130)
ALT: 22 U/L (ref 6–29)
AST: 21 U/L (ref 10–35)
BILIRUBIN TOTAL: 0.4 mg/dL (ref 0.2–1.2)
BUN: 3 mg/dL — AB (ref 7–25)
CHLORIDE: 102 mmol/L (ref 98–110)
CO2: 26 mmol/L (ref 20–32)
CREATININE: 0.65 mg/dL (ref 0.50–1.05)
Calcium: 8.9 mg/dL (ref 8.6–10.4)
GFR, Est African American: >89 mL/min (ref >=60)
GLUCOSE: 85 mg/dL (ref 65–99)
POTASSIUM: 3.1 mmol/L — AB (ref 3.5–5.3)
SODIUM: 142 mmol/L (ref 135–146)
Total Protein: 7 g/dL (ref 6.1–8.1)

## 2016-10-24 LAB — LIPID PANEL
CHOL/HDL RATIO: 3.4 ratio (ref <5.0)
CHOLESTEROL: 263 mg/dL — AB (ref <200)
HDL: 77 mg/dL (ref >50)
LDL Cholesterol: 150 mg/dL — ABNORMAL HIGH (ref <100)
Triglycerides: 181 mg/dL — ABNORMAL HIGH (ref <150)
VLDL: 36 mg/dL — ABNORMAL HIGH (ref <30)

## 2016-10-24 MED ORDER — LORATADINE 10 MG PO TABS: 10.0000 mg | ORAL_TABLET | Freq: Every day | ORAL | 5 refills | Status: DC | PRN

## 2016-10-24 NOTE — Assessment & Plan Note (Signed)
Much improved

## 2016-10-24 NOTE — Assessment & Plan Note (Signed)
Check PT/INR and PTT and platelets

## 2016-10-24 NOTE — Assessment & Plan Note (Signed)
Likely due to GI losses; refer to GI for evaluation and colonoscopy; recheck labs

## 2016-10-24 NOTE — Patient Instructions (Addendum)
Stop the Claritin-D as it has an ingredient in it that can raise your blood pressure and increase your risk of heart attack or stroke Let's get labs today Let's have you see the gastroenterologist I am so proud of you for giving up alcohol I am here to help you quit smoking when the day comes  Let's get the CT scan of your abdomen I'll invite you to talk to someone in the financial department about bills and seeing if you qualify for Medicaid

## 2016-10-24 NOTE — Assessment & Plan Note (Signed)
New LDL goal is less 70, we can see plaque regression with lower LDL and smoking cessation

## 2016-10-24 NOTE — Progress Notes (Signed)
BP 110/68   Pulse (!) 104   Temp 97.7 F (36.5 C) (Oral)   Resp 16   Wt 116 lb (52.6 kg)   LMP 11/19/2002 (Approximate)   SpO2 97%   BMI 21.22 kg/m    Subjective:    Patient ID: Yvonne Scott, female    DOB: 08-30-1960, 56 y.o.   MRN: [id number]  HPI: Yvonne Scott is a 56 y.o. female  Chief Complaint  Patient presents with  . Hospitalization Follow-up    metabolic acidosis    HPI Patient is here for hospital follow-up   She thinks it started with her bowels; backed up and then started gagging and throwing up; upper area has cleared out, but bowels from below are still backed up; they did not do a CT and has not had  Admission diagnoses included nausea, chronic alcohol abuse, high anion gap metabolic acidosis, and abnormal LFTs Her discharge diagnosis was metabolic acidosis  I reviewed labs Not drinking any more per staff She says they blamed it on alcohol but she hadn't had anything to drink for about 3 weeks; she was drinking 1-2 drinks most days of the week; not more than that  Bowels are backed up, had just a small BM this morning; urinating every hour; body swollen; abdomen and feet and ankles are all swollen; right arm is sore; "my body looks like crap"; BP cuff was on the right arm and was tight and patient believes that caused swelling in her right arm and soreness; swelling is getting better; left side lower back hurts; better now, thought maybe intestines; no blood in the urine; have seen mucous in the stools when she was all full of it; blood sugar were up and down  Abdomen US done 10/14/16: CLINICAL DATA:  Abnormal liver function tests.  EXAM: ULTRASOUND ABDOMEN LIMITED RIGHT UPPER QUADRANT  COMPARISON:  Abdomen CT dated 02/16/2014.  FINDINGS: Gallbladder:  No gallstones or wall thickening visualized. No sonographic Murphy sign noted by sonographer.  Common bile duct:  Diameter: 3.8 mm  Liver:  Diffusely  echogenic.  IMPRESSION: Diffusely echogenic liver, most likely due to interval steatosis. Chronic hepatitis can also have this appearance. No other findings to indicate that this represents cirrhosis. Otherwise, normal examination.   Electronically Signed   By: Claudie Revering M.D.   On: 10/14/2016 18:25  ---------------------------------------------  Smokes 1 ppd; not ready right now since she just quit something else, but will consider Reviewed last hep C and it was negative Sept 2017; no risky behaviors No fam hx of colon problems GERD, much improved she says but still burping up some sour stuff; drinking regular Pepsi now; misses her diet Pepsi; not any spicy foods  Depression screen [facility] 2/9 10/24/2016 11/19/2015  Decreased Interest 0 2  Down, Depressed, Hopeless 1 2  PHQ - 2 Score 1 4  Altered sleeping - 2  Tired, decreased energy - 0  Change in appetite - 0  Feeling bad or failure about yourself  - 0  Trouble concentrating - 0  Moving slowly or fidgety/restless - 0  Suicidal thoughts - 0  PHQ-9 Score - 6  Difficult doing work/chores - Not difficult at all    Relevant past medical, surgical, family and social history reviewed Past Medical History:  Diagnosis Date  . Anxiety   . Aortic atherosclerosis ([place]) 10/24/2016  . GERD (gastroesophageal reflux disease)    No past surgical history on file. Family History  Problem Relation Age of Onset  .  Cancer Mother 48       breast  . Head & neck cancer Father   . Cancer Father        melonma  . Cirrhosis Maternal Grandfather   . Cancer Paternal Grandmother        skin/breast   Social History   Social History  . Marital status: Widowed    Spouse name: N/A  . Number of children: N/A  . Years of education: N/A   Occupational History  . Not on file.   Social History Main Topics  . Smoking status: Current Every Day Smoker    Packs/day: 1.00    Years: 36.00    Types: Cigarettes    Start date: 03/15/1979  .  Smokeless tobacco: Never Used  . Alcohol use No  . Drug use: No  . Sexual activity: Not Currently   Other Topics Concern  . Not on file   Social History Narrative  . No narrative on file    Interim medical history since last visit reviewed. Allergies and medications reviewed  Review of Systems Per HPI unless specifically indicated above     Objective:    BP 110/68   Pulse (!) 104   Temp 97.7 F (36.5 C) (Oral)   Resp 16   Wt 116 lb (52.6 kg)   LMP 11/19/2002 (Approximate)   SpO2 97%   BMI 21.22 kg/m   Wt Readings from Last 3 Encounters:  10/24/16 116 lb (52.6 kg)  10/16/16 134 lb 6.4 oz (61 kg)  11/19/15 110 lb (49.9 kg)    Physical Exam  Constitutional: She appears well-developed and well-nourished. No distress.  HENT:  Head: Normocephalic and atraumatic.  Eyes: EOM are normal. No scleral icterus.  Neck: No thyromegaly present.  Cardiovascular: Normal rate, regular rhythm and normal heart sounds.   No murmur heard. Pulmonary/Chest: Effort normal and breath sounds normal. No respiratory distress. She has no wheezes.  Abdominal: Soft. Bowel sounds are normal. She exhibits no distension.  Musculoskeletal: Normal range of motion. She exhibits edema (pitting edema of ankles and feet).  Neurological: She is alert. She exhibits normal muscle tone.  Skin: Skin is warm and dry. Ecchymosis noted. She is not diaphoretic. No pallor.     Superficial ecchymoses on the right sun-exposed forearm; skin is wrinkled beyond years  Psychiatric: She has a normal mood and affect. Her behavior is normal. Judgment and thought content normal.       Assessment & Plan:   Problem List Items Addressed This Visit      Cardiovascular and Mediastinum   Aortic atherosclerosis ([place])    New LDL goal is less 70, we can see plaque regression with lower LDL and smoking cessation      Relevant Orders   Lipid panel (Completed)     Digestive   GERD (gastroesophageal reflux disease)     Much improved      Fatty liver   Relevant Orders   Hepatitis panel, acute (Completed)   COMPLETE METABOLIC PANEL WITH GFR (Completed)     Other   Tobacco abuse    Patient not ready to quit now; I am here to help if and when she wants to quit      Thrombocytopenia ([place])    Check platelet count      Relevant Orders   CBC with Differential/Platelet (Completed)   Metabolic acidosis - Primary    Likely due to GI losses; refer to GI for evaluation and colonoscopy; recheck labs  Relevant Orders   COMPLETE METABOLIC PANEL WITH GFR (Completed)   Ecchymoses, spontaneous    Check PT/INR and PTT and platelets      Relevant Orders   CBC with Differential/Platelet (Completed)   INR/PT (Completed)   PTT (Completed)   Anemia    Check CBC and iron panel      Relevant Orders   CBC with Differential/Platelet (Completed)   Fe+TIBC+Fer (Completed)   B12 (Completed)   Folate (Completed)   Ambulatory referral to Gastroenterology    Other Visit Diagnoses    Mucous in stools       Relevant Orders   Ambulatory referral to Gastroenterology   Periumbilical abdominal pain       Relevant Orders   CT Abdomen Pelvis W Contrast (Completed)       Follow up plan: Return in about 2 weeks (around 11/10/2016) for follow-up visit with Dr. Sanda Klein.  An after-visit summary was printed and given to the patient at [facility].  Please see the patient instructions which may contain other information and recommendations beyond what is mentioned above in the assessment and plan.  Meds ordered this encounter  Medications  . loratadine (CLARITIN) 10 MG tablet    Sig: Take 1 tablet (10 mg total) by mouth daily as needed for allergies.    Dispense:  30 tablet    Refill:  5    Orders Placed This Encounter  Procedures  . CT Abdomen Pelvis W Contrast  . Hepatitis panel, acute  . CBC with Differential/Platelet  . COMPLETE METABOLIC PANEL WITH GFR  . Lipid panel  . INR/PT  . PTT  . Fe+TIBC+Fer  .  B12  . Folate  . Ambulatory referral to Gastroenterology

## 2016-10-24 NOTE — Assessment & Plan Note (Signed)
Check platelet count 

## 2016-10-24 NOTE — Assessment & Plan Note (Signed)
Check CBC and iron panel. 

## 2016-10-24 NOTE — Assessment & Plan Note (Signed)
Patient not ready to quit now; I am here to help if and when she wants to quit

## 2016-10-25 ENCOUNTER — Telehealth: Payer: Self-pay

## 2016-10-25 LAB — HEPATITIS PANEL, ACUTE
HCV Ab: NONREACTIVE
HEP A IGM: NONREACTIVE
HEP B C IGM: NONREACTIVE
HEP B S AG: NONREACTIVE

## 2016-10-25 LAB — IRON,TIBC AND FERRITIN PANEL
%SAT: 28 % (ref 11–50)
FERRITIN: 540 ng/mL — AB (ref 10–232)
IRON: 80 ug/dL (ref 45–160)
TIBC: 282 ug/dL (ref 250–450)

## 2016-10-25 LAB — FOLATE: FOLATE: 7.8 ng/mL (ref >5.4)

## 2016-10-25 LAB — APTT: APTT: 28 s (ref 22–34)

## 2016-10-25 LAB — PROTIME-INR
INR: 1.1
PROTHROMBIN TIME: 11.2 s (ref 9.0–11.5)

## 2016-10-25 LAB — VITAMIN B12: Vitamin B-12: 300 pg/mL (ref 200–1100)

## 2016-10-25 NOTE — Telephone Encounter (Signed)
I called this patient to inform her that she has been scheduled to have her CT on Monday, August 20 , 2018 @ 3:00pm at the [facility], but there was no answer. A message was left for her with that information. She was also instructed to arrive 15 mins early and to pick up a prep kit prior to her imaging. She was given the number to scheduling [phone]) in case that appt date and time is not good for her.

## 2016-10-26 ENCOUNTER — Other Ambulatory Visit: Payer: Self-pay | Admitting: Family Medicine

## 2016-10-26 MED ORDER — POTASSIUM CHLORIDE ER 10 MEQ PO TBCR: 10.0000 meq | EXTENDED_RELEASE_TABLET | Freq: Two times a day (BID) | ORAL | 0 refills | Status: DC

## 2016-10-26 NOTE — Progress Notes (Signed)
Rx for KCl 

## 2016-10-31 ENCOUNTER — Ambulatory Visit
Admission: RE | Admit: 2016-10-31 | Discharge: 2016-10-31 | Disposition: A | Discharge status: Home / Self Care | Payer: Self-pay | Source: Ambulatory Visit | Attending: Family Medicine | Admitting: Family Medicine

## 2016-10-31 DIAGNOSIS — R1033 Periumbilical pain: Secondary | ICD-10-CM

## 2016-10-31 DIAGNOSIS — I700 Atherosclerosis of aorta: Secondary | ICD-10-CM | POA: Insufficient documentation

## 2016-10-31 MED ORDER — IOPAMIDOL (ISOVUE-300) INJECTION 61%
100.0000 mL | Freq: Once | INTRAVENOUS | Status: AC | PRN
  Administered 2016-10-31: 100 mL via INTRAVENOUS

## 2016-11-01 ENCOUNTER — Other Ambulatory Visit: Payer: Self-pay

## 2016-11-01 ENCOUNTER — Other Ambulatory Visit: Payer: Self-pay | Admitting: Family Medicine

## 2016-11-01 DIAGNOSIS — I700 Atherosclerosis of aorta: Secondary | ICD-10-CM

## 2016-11-01 MED ORDER — ATORVASTATIN CALCIUM 40 MG PO TABS: 40.0000 mg | ORAL_TABLET | Freq: Every day | ORAL | 1 refills | Status: DC

## 2016-11-01 NOTE — Progress Notes (Signed)
Start atorvastatin; recheck lipids in 6 weeks

## 2016-11-07 ENCOUNTER — Encounter: Payer: Self-pay | Admitting: Family Medicine

## 2016-11-07 ENCOUNTER — Ambulatory Visit (INDEPENDENT_AMBULATORY_CARE_PROVIDER_SITE_OTHER): Payer: Self-pay | Admitting: Family Medicine

## 2016-11-07 VITALS — BP 172/100 | HR 88 | Temp 98.7°F | Resp 18 | Wt 116.0 lb

## 2016-11-07 DIAGNOSIS — D649 Anemia, unspecified: Secondary | ICD-10-CM

## 2016-11-07 DIAGNOSIS — K760 Fatty (change of) liver, not elsewhere classified: Secondary | ICD-10-CM

## 2016-11-07 DIAGNOSIS — R600 Localized edema: Secondary | ICD-10-CM

## 2016-11-07 DIAGNOSIS — I10 Essential (primary) hypertension: Secondary | ICD-10-CM

## 2016-11-07 DIAGNOSIS — D473 Essential (hemorrhagic) thrombocythemia: Secondary | ICD-10-CM

## 2016-11-07 DIAGNOSIS — Z5181 Encounter for therapeutic drug level monitoring: Secondary | ICD-10-CM

## 2016-11-07 DIAGNOSIS — R011 Cardiac murmur, unspecified: Secondary | ICD-10-CM

## 2016-11-07 DIAGNOSIS — R0602 Shortness of breath: Secondary | ICD-10-CM

## 2016-11-07 DIAGNOSIS — D75839 Thrombocytosis, unspecified: Secondary | ICD-10-CM

## 2016-11-07 DIAGNOSIS — E876 Hypokalemia: Secondary | ICD-10-CM

## 2016-11-07 DIAGNOSIS — I700 Atherosclerosis of aorta: Secondary | ICD-10-CM

## 2016-11-07 DIAGNOSIS — R7989 Other specified abnormal findings of blood chemistry: Secondary | ICD-10-CM

## 2016-11-07 MED ORDER — LISINOPRIL-HYDROCHLOROTHIAZIDE 10-12.5 MG PO TABS: 0.5000 | ORAL_TABLET | Freq: Every day | ORAL | 0 refills | Status: DC

## 2016-11-07 NOTE — Assessment & Plan Note (Addendum)
Check creatinine and K+ today; plan to recheck BMP next week after addition of ACE-I/thiazide

## 2016-11-07 NOTE — Assessment & Plan Note (Signed)
Mild, noted on last CBC; plan to recheck CBC and ferritin next week, expecting them to come down if truly acute phase reactants while she was sick/hospitalized

## 2016-11-07 NOTE — Assessment & Plan Note (Signed)
Suspect this was an acute phase reactant, but will plan to recheck next week when she return for other labs; if remaining elevated, then consider work-up for [facility]

## 2016-11-07 NOTE — Assessment & Plan Note (Signed)
Improving, with just mildly low RBC last check

## 2016-11-07 NOTE — Assessment & Plan Note (Addendum)
Will add ACE-I/thiazide; ACE-I because of possible CHF, thiazide to help with edema plus BP; will start very low dose, as last BP was within normal range; will have close f/u with NP-C here next week; plan to recheck BMP with this addition; medicine can be adjusted based on next week's pressure and her response; discussed role of stress on blood pressure; no family issue or event is worth having a stroke over, use stress reduction to relax when feeling uptight

## 2016-11-07 NOTE — Assessment & Plan Note (Signed)
Noted on [place], but not mentioned on CT scan; acute hepatitis panel was negative in August; will recheck ferritin level next week; if remaining elevated, consider testing for [facility]

## 2016-11-07 NOTE — Progress Notes (Signed)
BP (!) 172/100   Pulse 88   Temp 98.7 F (37.1 C) (Oral)   Resp 18   Wt 116 lb (52.6 kg)   LMP 11/19/2002 (Approximate)   SpO2 95%   BMI 21.22 kg/m    Subjective:    Patient ID: Yvonne Scott, female    DOB: [birth year], 56 y.o.   MRN: [id number]  HPI: Yvonne Scott is a 56 y.o. female  Chief Complaint  Patient presents with  . Follow-up    HPI Patient is here for f/u She has high BP today, which is out of the ordinary for her compared to her last two readings; however, she says it will go up; she was at the dentist and they told her it was high; see previous note She says on the way over here, she thought her car was breaking down; had to get oil Stress on the trip over Has had some salt, but not a lot BP Readings from Last 3 Encounters:  11/07/16 (!) 172/100  10/24/16 110/68  10/17/16 117/78  She was seen on August 13th, note reviewed; she says she was not drinking any more alcohol at that time She had edema of her feet last visit but now it's worse; hands are better; a little short of breath She elevates her feet at night; they are much less swollen first thing in the morning, then go right back up Urinating every 2 hours at night Smoking 1 ppd a day; habit makes her reach for a cigarette; stress; after eating; not drinking any alcohol High ferritin noted 10/24/16, discussed possible acute phase reactant High cholesterol; taking medicine before bed; no problems with it so far; mother did not tolerate statins  CT of abd and pelvis October 31, 2016: ------------------------------------------------ CLINICAL DATA:  Chronic periumbilical abdominal pain.  EXAM: CT ABDOMEN AND PELVIS WITH CONTRAST  TECHNIQUE: Multidetector CT imaging of the abdomen and pelvis was performed using the standard protocol following bolus administration of intravenous contrast.  CONTRAST:  116m ISOVUE-300 IOPAMIDOL (ISOVUE-300) INJECTION 61%  COMPARISON:  CT scan of February 16, 2014.  FINDINGS: Lower chest: No acute abnormality.  Hepatobiliary: No focal liver abnormality is seen. No gallstones, gallbladder wall thickening, or biliary dilatation.  Pancreas: Unremarkable. No pancreatic ductal dilatation or surrounding inflammatory changes.  Spleen: Normal in size without focal abnormality.  Adrenals/Urinary Tract: Adrenal glands are unremarkable. Kidneys are normal, without renal calculi, focal lesion, or hydronephrosis. Bladder is unremarkable.  Stomach/Bowel: Stomach is within normal limits. Appendix appears normal. No evidence of bowel wall thickening, distention, or inflammatory changes.  Vascular/Lymphatic: Aortic atherosclerosis. No enlarged abdominal or pelvic lymph nodes.  Reproductive: Uterus and bilateral adnexa are unremarkable.  Other: No abdominal wall hernia or abnormality. No abdominopelvic ascites.  Musculoskeletal: No acute or significant osseous findings.  IMPRESSION: Aortic atherosclerosis. No other abnormality seen in the abdomen or pelvis.   Electronically Signed   By: JMarijo Conception M.D.   On: 10/31/2016 16:50 -----------------------------------------------  She is still smoking, 1 ppd No cramps in the legs Finished potassium for low K+ of 3.1 Vitamin B12 was low and she just started taking the vitamins They checked her FSBS frequently and told her to stop diet Pepsi and drink regular Pepsi Asked about what else to eat; they did not talk about salt  Depression screen [facility] 11/07/2016 10/24/2016 11/19/2015  Decreased Interest 0 0 2  Down, Depressed, Hopeless 0 1 2  PHQ - 2 Score 0 1 4  Altered sleeping - - 2  Tired, decreased energy - - 0  Change in appetite - - 0  Feeling bad or failure about yourself  - - 0  Trouble concentrating - - 0  Moving slowly or fidgety/restless - - 0  Suicidal thoughts - - 0  PHQ-9 Score - - 6  Difficult doing work/chores - - Not difficult at all    Relevant past  medical, surgical, family and social history reviewed Past Medical History:  Diagnosis Date  . Anxiety   . Aortic atherosclerosis ([place]) 10/24/2016  . GERD (gastroesophageal reflux disease)    History reviewed. No pertinent surgical history. Family History  Problem Relation Age of Onset  . Cancer Mother 12       breast  . Head & neck cancer Father   . Cancer Father        melonma  . Cirrhosis Maternal Grandfather   . Cancer Paternal Grandmother        skin/breast   Social History   Social History  . Marital status: Widowed    Spouse name: N/A  . Number of children: N/A  . Years of education: N/A   Occupational History  . Not on file.   Social History Main Topics  . Smoking status: Current Every Day Smoker    Packs/day: 1.00    Years: 36.00    Types: Cigarettes    Start date: 03/15/1979  . Smokeless tobacco: Never Used  . Alcohol use No  . Drug use: No  . Sexual activity: Not Currently   Other Topics Concern  . Not on file   Social History Narrative  . No narrative on file    Interim medical history since last visit reviewed. Allergies and medications reviewed  Review of Systems Per HPI unless specifically indicated above     Objective:    BP (!) 172/100   Pulse 88   Temp 98.7 F (37.1 C) (Oral)   Resp 18   Wt 116 lb (52.6 kg)   LMP 11/19/2002 (Approximate)   SpO2 95%   BMI 21.22 kg/m   Wt Readings from Last 3 Encounters:  11/07/16 116 lb (52.6 kg)  10/24/16 116 lb (52.6 kg)  10/16/16 134 lb 6.4 oz (61 kg)    Physical Exam  Constitutional: She appears well-developed and well-nourished. No distress.  HENT:  Head: Normocephalic and atraumatic.  Eyes: EOM are normal. No scleral icterus.  Neck: No thyromegaly present.  Cardiovascular: Normal rate and regular rhythm.   No extrasystoles are present.  Murmur heard.  Systolic murmur is present with a grade of 1/6  Pulmonary/Chest: Effort normal and breath sounds normal. No respiratory distress. She  has no wheezes.  Abdominal: Soft. Bowel sounds are normal. She exhibits no distension.  Musculoskeletal: Normal range of motion. She exhibits edema (pitting edema of ankles and feet).  Neurological: She is alert. She exhibits normal muscle tone.  Skin: Skin is warm and dry. No ecchymosis noted. She is not diaphoretic. No pallor.     skin is wrinkled beyond years  Psychiatric: She has a normal mood and affect. Her behavior is normal. Judgment and thought content normal. Her mood appears not anxious. Her speech is not rapid and/or pressured and not slurred. She does not exhibit a depressed mood.    Results for orders placed or performed in visit on 10/24/16  Hepatitis panel, acute  Result Value Ref Range   Hepatitis B Surface Ag NON-REACTIVE NON-REACTIVE   HCV Ab NON-REACTIVE NON-REACTIVE  Hep B C IgM NON REACTIVE NON-REACTIVE   Hep A IgM NON-REACTIVE NON-REACTIVE  CBC with Differential/Platelet  Result Value Ref Range   WBC 6.5 3.8 - 10.8 K/uL   RBC 3.61 (L) 3.80 - 5.10 MIL/uL   Hemoglobin 12.0 11.7 - 15.5 g/dL   HCT 35.4 35.0 - 45.0 %   MCV 98.1 80.0 - 100.0 fL   MCH 33.2 (H) 27.0 - 33.0 pg   MCHC 33.9 32.0 - 36.0 g/dL   RDW 14.0 11.0 - 15.0 %   Platelets 439 (H) 140 - 400 K/uL   MPV 10.3 7.5 - 12.5 fL   Neutro Abs 3,575 1,500 - 7,800 cells/uL   Lymphs Abs 2,080 850 - 3,900 cells/uL   Monocytes Absolute 650 200 - 950 cells/uL   Eosinophils Absolute 130 15 - 500 cells/uL   Basophils Absolute 65 0 - 200 cells/uL   Neutrophils Relative % 55 %   Lymphocytes Relative 32 %   Monocytes Relative 10 %   Eosinophils Relative 2 %   Basophils Relative 1 %   Smear Review Criteria for review not met   COMPLETE METABOLIC PANEL WITH GFR  Result Value Ref Range   Sodium 142 135 - 146 mmol/L   Potassium 3.1 (L) 3.5 - 5.3 mmol/L   Chloride 102 98 - 110 mmol/L   CO2 26 20 - 32 mmol/L   Glucose, Bld 85 65 - 99 mg/dL   BUN 3 (L) 7 - 25 mg/dL   Creat 0.65 0.50 - 1.05 mg/dL   Total  Bilirubin 0.4 0.2 - 1.2 mg/dL   Alkaline Phosphatase 67 33 - 130 U/L   AST 21 10 - 35 U/L   ALT 22 6 - 29 U/L   Total Protein 7.0 6.1 - 8.1 g/dL   Albumin 4.3 3.6 - 5.1 g/dL   Calcium 8.9 8.6 - 10.4 mg/dL   GFR, Est African American >89 >=60 mL/min   GFR, Est Non African American >89 >=60 mL/min  Lipid panel  Result Value Ref Range   Cholesterol 263 (H) <200 mg/dL   Triglycerides 181 (H) <150 mg/dL   HDL 77 >50 mg/dL   Total CHOL/HDL Ratio 3.4 <5.0 Ratio   VLDL 36 (H) <30 mg/dL   LDL Cholesterol 150 (H) <100 mg/dL  INR/PT  Result Value Ref Range   Prothrombin Time 11.2 9.0 - 11.5 sec   INR 1.1   PTT  Result Value Ref Range   aPTT 28 22 - 34 sec  Fe+TIBC+Fer  Result Value Ref Range   Ferritin 540 (H) 10 - 232 ng/mL   Iron 80 45 - 160 ug/dL   TIBC 282 250 - 450 ug/dL   %SAT 28 11 - 50 %  B12  Result Value Ref Range   Vitamin B-12 300 200 - 1,100 pg/mL  Folate  Result Value Ref Range   Folate 7.8 >5.4 ng/mL      Assessment & Plan:   Problem List Items Addressed This Visit      Cardiovascular and Mediastinum   Essential hypertension, benign - Primary    Will add ACE-I/thiazide; ACE-I because of possible CHF, thiazide to help with edema plus BP; will start very low dose, as last BP was within normal range; will have close f/u with NP-C here next week; plan to recheck BMP with this addition; medicine can be adjusted based on next week's pressure and her response; discussed role of stress on blood pressure; no family issue or event is worth  having a stroke over, use stress reduction to relax when feeling uptight      Relevant Medications   lisinopril-hydrochlorothiazide (PRINZIDE,ZESTORETIC) 10-12.5 MG tablet   Aortic atherosclerosis ([place])    Reviewed with her; goal LDL less than 70; tolerating statin fine; plan to recheck fasting lipid panel 6-8 weeks after initiation of statin; try to limit saturated fats      Relevant Medications   lisinopril-hydrochlorothiazide  (PRINZIDE,ZESTORETIC) 10-12.5 MG tablet     Digestive   Fatty liver    Noted on [place], but not mentioned on CT scan; acute hepatitis panel was negative in August; will recheck ferritin level next week; if remaining elevated, consider testing for [facility]        Hematopoietic and Hemostatic   Thrombocytosis ([place])    Mild, noted on last CBC; plan to recheck CBC and ferritin next week, expecting them to come down if truly acute phase reactants while she was sick/hospitalized      Relevant Orders   CBC     Other   Medication monitoring encounter    Check creatinine and K+ today; plan to recheck BMP next week after addition of ACE-I/thiazide      Relevant Orders   Basic Metabolic Panel (BMET)   Magnesium   Basic Metabolic Panel (BMET)   Elevated ferritin    Suspect this was an acute phase reactant, but will plan to recheck next week when she return for other labs; if remaining elevated, then consider work-up for [facility]      Relevant Orders   Ferritin   Anemia    Improving, with just mildly low RBC last check      Relevant Orders   CBC    Other Visit Diagnoses    Cardiac murmur       subtle, but patient having leg edema, SHOB; will get echo to evaluate; no fevers to suggest endocarditis, so will get TTE   Relevant Orders   ECHOCARDIOGRAM COMPLETE   Leg edema       Relevant Orders   ECHOCARDIOGRAM COMPLETE   Shortness of breath       Relevant Orders   ECHOCARDIOGRAM COMPLETE   Hypokalemia       check today, along with Mg2+; plan to recheck next week with addition of the ACE-I/thiazide; I am hopeful the ACE-I will help hold on to K+   Relevant Orders   Basic Metabolic Panel (BMET)   Magnesium   Basic Metabolic Panel (BMET)       Follow up plan: Return in about 8 days (around 11/15/2016) for follow-up with Raelyn Ensign for BP recheck and labs.  All labs for next week's visit are ordered -- just need to be released  An after-visit summary was printed and given to the patient at  [place].  Please see the patient instructions which may contain other information and recommendations beyond what is mentioned above in the assessment and plan.  Meds ordered this encounter  Medications  . lisinopril-hydrochlorothiazide (PRINZIDE,ZESTORETIC) 10-12.5 MG tablet    Sig: Take 0.5 tablets by mouth daily.    Dispense:  15 tablet    Refill:  0    Orders Placed This Encounter  Procedures  . Basic Metabolic Panel (BMET)  . Magnesium  . Ferritin  . CBC  . Basic Metabolic Panel (BMET)  . ECHOCARDIOGRAM COMPLETE

## 2016-11-07 NOTE — Patient Instructions (Addendum)
I will recommend a heart health diet Don't add any extra salt to your foods, either when cooking or at the table Okay to use herbs and pepper but not extra salt  Heart-Healthy Eating Plan Heart-healthy meal planning includes:  Limiting unhealthy fats.  Increasing healthy fats.  Making other small dietary changes.  You may need to talk with your doctor or a diet specialist (dietitian) to create an eating plan that is right for you. What types of fat should I choose?  Choose healthy fats. These include olive oil and canola oil, flaxseeds, walnuts, almonds, and seeds.  Eat more omega-3 fats. These include salmon, mackerel, sardines, tuna, flaxseed oil, and ground flaxseeds. Try to eat fish at least twice each week.  Limit saturated fats. ? Saturated fats are often found in animal products, such as meats, butter, and cream. ? Plant sources of saturated fats include palm oil, palm kernel oil, and coconut oil.  Avoid foods with partially hydrogenated oils in them. These include stick margarine, some tub margarines, cookies, crackers, and other baked goods. These contain trans fats. What general guidelines do I need to follow?  Check food labels carefully. Identify foods with trans fats or high amounts of saturated fat.  Fill one half of your plate with vegetables and green salads. Eat 4-5 servings of vegetables per day. A serving of vegetables is: ? 1 cup of raw leafy vegetables. ?  cup of raw or cooked cut-up vegetables. ?  cup of vegetable juice.  Fill one fourth of your plate with whole grains. Look for the word "whole" as the first word in the ingredient list.  Fill one fourth of your plate with lean protein foods.  Eat 4-5 servings of fruit per day. A serving of fruit is: ? One medium whole fruit. ?  cup of dried fruit. ?  cup of fresh, frozen, or canned fruit. ?  cup of 100% fruit juice.  Eat more foods that contain soluble fiber. These include apples, broccoli,  carrots, beans, peas, and barley. Try to get 20-30 g of fiber per day.  Eat more home-cooked food. Eat less restaurant, buffet, and fast food.  Limit or avoid alcohol.  Limit foods high in starch and sugar.  Avoid fried foods.  Avoid frying your food. Try baking, boiling, grilling, or broiling it instead. You can also reduce fat by: ? Removing the skin from poultry. ? Removing all visible fats from meats. ? Skimming the fat off of stews, soups, and gravies before serving them. ? Steaming vegetables in water or broth.  Lose weight if you are overweight.  Eat 4-5 servings of nuts, legumes, and seeds per week: ? One serving of dried beans or legumes equals  cup after being cooked. ? One serving of nuts equals 1 ounces. ? One serving of seeds equals  ounce or one tablespoon.  You may need to keep track of how much salt or sodium you eat. This is especially true if you have high blood pressure. Talk with your doctor or dietitian to get more information. What foods can I eat? Grains Breads, including [place], white, pita, wheat, raisin, rye, oatmeal, and [place]. Tortillas that are neither fried nor made with lard or trans fat. Low-fat rolls, including hotdog and hamburger buns and English muffins. Biscuits. Muffins. Waffles. Pancakes. Light popcorn. Whole-grain cereals. Flatbread. Melba toast. Pretzels. Breadsticks. Rusks. Low-fat snacks. Low-fat crackers, including oyster, saltine, matzo, graham, animal, and rye. Rice and pasta, including brown rice and pastas that are made  with whole wheat. Vegetables All vegetables. Fruits All fruits, but limit coconut. Meats and Other Protein Sources Lean, well-trimmed beef, veal, pork, and lamb. Chicken and [place] without skin. All fish and shellfish. Wild duck, rabbit, pheasant, and venison. Egg whites or low-cholesterol egg substitutes. Dried beans, peas, lentils, and tofu. Seeds and most nuts. Dairy Low-fat or nonfat cheeses, including ricotta,  string, and mozzarella. Skim or 1% milk that is liquid, powdered, or evaporated. Buttermilk that is made with low-fat milk. Nonfat or low-fat yogurt. Beverages Mineral water. Diet carbonated beverages. Sweets and Desserts Sherbets and fruit ices. Honey, jam, marmalade, jelly, and syrups. Meringues and gelatins. Pure sugar candy, such as hard candy, jelly beans, gumdrops, mints, marshmallows, and small amounts of dark chocolate. [employer] Eat all sweets and desserts in moderation. Fats and Oils Nonhydrogenated (trans-free) margarines. Vegetable oils, including soybean, sesame, sunflower, olive, peanut, safflower, corn, canola, and cottonseed. Salad dressings or mayonnaise made with a vegetable oil. Limit added fats and oils that you use for cooking, baking, salads, and as spreads. Other Cocoa powder. Coffee and tea. All seasonings and condiments. The items listed above may not be a complete list of recommended foods or beverages. Contact your dietitian for more options. What foods are not recommended? Grains Breads that are made with saturated or trans fats, oils, or whole milk. Croissants. Butter rolls. Cheese breads. Sweet rolls. Donuts. Buttered popcorn. Chow mein noodles. High-fat crackers, such as cheese or butter crackers. Meats and Other Protein Sources Fatty meats, such as hotdogs, short ribs, sausage, spareribs, bacon, rib eye roast or steak, and mutton. High-fat deli meats, such as salami and bologna. Caviar. Domestic duck and goose. Organ meats, such as kidney, liver, sweetbreads, and heart. Dairy Cream, sour cream, cream cheese, and creamed cottage cheese. Whole-milk cheeses, including blue (bleu), Monte[place] Jack, [place], [place], [place], [place], Swiss, cheddar, [place], and [place]. Whole or 2% milk that is liquid, evaporated, or condensed. Whole buttermilk. Cream sauce or high-fat cheese sauce. Yogurt that is made from whole milk. Beverages Regular sodas and juice drinks with  added sugar. Sweets and Desserts Frosting. Pudding. Cookies. Cakes other than angel food cake. Candy that has milk chocolate or white chocolate, hydrogenated fat, butter, coconut, or unknown ingredients. Buttered syrups. Full-fat ice cream or ice cream drinks. Fats and Oils Gravy that has suet, meat fat, or shortening. Cocoa butter, hydrogenated oils, palm oil, coconut oil, palm kernel oil. These can often be found in baked products, candy, fried foods, nondairy creamers, and whipped toppings. Solid fats and shortenings, including bacon fat, salt pork, lard, and butter. Nondairy cream substitutes, such as coffee creamers and sour cream substitutes. Salad dressings that are made of unknown oils, cheese, or sour cream. The items listed above may not be a complete list of foods and beverages to avoid. Contact your dietitian for more information. This information is not intended to replace advice given to you by your health care provider. Make sure you discuss any questions you have with your health care provider. Document Released: 08/30/2011 Document Revised: 08/06/2015 Document Reviewed: 08/22/2013 Elsevier Interactive Patient Education  2018 [place] Eating Plan DASH stands for "Dietary Approaches to Stop Hypertension." The DASH eating plan is a healthy eating plan that has been shown to reduce high blood pressure (hypertension). It may also reduce your risk for type 2 diabetes, heart disease, and stroke. The DASH eating plan may also help with weight loss. What are tips for following this plan? General guidelines  Avoid eating  more than 2,300 mg (milligrams) of salt (sodium) a day. If you have hypertension, you may need to reduce your sodium intake to 1,500 mg a day.  Limit alcohol intake to no more than 1 drink a day for nonpregnant women and 2 drinks a day for men. One drink equals 12 oz of beer, 5 oz of wine, or 1 oz of hard liquor.  Work with your health care provider to maintain  a healthy body weight or to lose weight. Ask what an ideal weight is for you.  Get at least 30 minutes of exercise that causes your heart to beat faster (aerobic exercise) most days of the week. Activities may include walking, swimming, or biking.  Work with your health care provider or diet and nutrition specialist (dietitian) to adjust your eating plan to your individual calorie needs. Reading food labels  Check food labels for the amount of sodium per serving. Choose foods with less than 5 percent of the Daily Value of sodium. Generally, foods with less than 300 mg of sodium per serving fit into this eating plan.  To find whole grains, look for the word "whole" as the first word in the ingredient list. Shopping  Buy products labeled as "low-sodium" or "no salt added."  Buy fresh foods. Avoid canned foods and premade or frozen meals. Cooking  Avoid adding salt when cooking. Use salt-free seasonings or herbs instead of table salt or sea salt. Check with your health care provider or pharmacist before using salt substitutes.  Do not fry foods. Cook foods using healthy methods such as baking, boiling, grilling, and broiling instead.  Cook with heart-healthy oils, such as olive, canola, soybean, or sunflower oil. Meal planning   Eat a balanced diet that includes: ? 5 or more servings of fruits and vegetables each day. At each meal, try to fill half of your plate with fruits and vegetables. ? Up to 6-8 servings of whole grains each day. ? Less than 6 oz of lean meat, poultry, or fish each day. A 3-oz serving of meat is about the same size as a deck of cards. One egg equals 1 oz. ? 2 servings of low-fat dairy each day. ? A serving of nuts, seeds, or beans 5 times each week. ? Heart-healthy fats. Healthy fats called Omega-3 fatty acids are found in foods such as flaxseeds and coldwater fish, like sardines, salmon, and mackerel.  Limit how much you eat of the following: ? Canned or  prepackaged foods. ? Food that is high in trans fat, such as fried foods. ? Food that is high in saturated fat, such as fatty meat. ? Sweets, desserts, sugary drinks, and other foods with added sugar. ? Full-fat dairy products.  Do not salt foods before eating.  Try to eat at least 2 vegetarian meals each week.  Eat more home-cooked food and less restaurant, buffet, and fast food.  When eating at a restaurant, ask that your food be prepared with less salt or no salt, if possible. What foods are recommended? The items listed may not be a complete list. Talk with your dietitian about what dietary choices are best for you. Grains Whole-grain or whole-wheat bread. Whole-grain or whole-wheat pasta. Brown rice. [place]. Bulgur. Whole-grain and low-sodium cereals. Pita bread. Low-fat, low-sodium crackers. Whole-wheat flour tortillas. Vegetables Fresh or frozen vegetables (raw, steamed, roasted, or grilled). Low-sodium or reduced-sodium tomato and vegetable juice. Low-sodium or reduced-sodium tomato sauce and tomato paste. Low-sodium or reduced-sodium canned vegetables. Fruits All fresh,  dried, or frozen fruit. Canned fruit in natural juice (without added sugar). Meat and other protein foods Skinless chicken or [place]. Ground chicken or [place]. Pork with fat trimmed off. Fish and seafood. Egg whites. Dried beans, peas, or lentils. Unsalted nuts, nut butters, and seeds. Unsalted canned beans. Lean cuts of beef with fat trimmed off. Low-sodium, lean deli meat. Dairy Low-fat (1%) or fat-free (skim) milk. Fat-free, low-fat, or reduced-fat cheeses. Nonfat, low-sodium ricotta or cottage cheese. Low-fat or nonfat yogurt. Low-fat, low-sodium cheese. Fats and oils Soft margarine without trans fats. Vegetable oil. Low-fat, reduced-fat, or light mayonnaise and salad dressings (reduced-sodium). Canola, safflower, olive, soybean, and sunflower oils. Avocado. Seasoning and other foods Herbs. Spices.  Seasoning mixes without salt. Unsalted popcorn and pretzels. Fat-free sweets. What foods are not recommended? The items listed may not be a complete list. Talk with your dietitian about what dietary choices are best for you. Grains Baked goods made with fat, such as croissants, muffins, or some breads. Dry pasta or rice meal packs. Vegetables Creamed or fried vegetables. Vegetables in a cheese sauce. Regular canned vegetables (not low-sodium or reduced-sodium). Regular canned tomato sauce and paste (not low-sodium or reduced-sodium). Regular tomato and vegetable juice (not low-sodium or reduced-sodium). Angie Fava. Olives. Fruits Canned fruit in a light or heavy syrup. Fried fruit. Fruit in cream or butter sauce. Meat and other protein foods Fatty cuts of meat. Ribs. Fried meat. Berniece Salines. Sausage. Bologna and other processed lunch meats. Salami. [place]. Hotdogs. Bratwurst. Salted nuts and seeds. Canned beans with added salt. Canned or smoked fish. Whole eggs or egg yolks. Chicken or [place] with skin. Dairy Whole or 2% milk, cream, and half-and-half. Whole or full-fat cream cheese. Whole-fat or sweetened yogurt. Full-fat cheese. Nondairy creamers. Whipped toppings. Processed cheese and cheese spreads. Fats and oils Butter. Stick margarine. Lard. Shortening. Ghee. Bacon fat. Tropical oils, such as coconut, palm kernel, or palm oil. Seasoning and other foods Salted popcorn and pretzels. Onion salt, garlic salt, seasoned salt, table salt, and sea salt. Worcestershire sauce. Tartar sauce. Barbecue sauce. Teriyaki sauce. Soy sauce, including reduced-sodium. Steak sauce. Canned and packaged gravies. Fish sauce. Oyster sauce. Cocktail sauce. Horseradish that you find on the shelf. Ketchup. Mustard. Meat flavorings and tenderizers. Bouillon cubes. Hot sauce and Tabasco sauce. Premade or packaged marinades. Premade or packaged taco seasonings. Relishes. Regular salad dressings. Where to find more  information:  National Heart, Lung, and [place]: https://wilson-eaton.com/  American Heart Association: www.heart.org Summary  The DASH eating plan is a healthy eating plan that has been shown to reduce high blood pressure (hypertension). It may also reduce your risk for type 2 diabetes, heart disease, and stroke.  With the DASH eating plan, you should limit salt (sodium) intake to 2,300 mg a day. If you have hypertension, you may need to reduce your sodium intake to 1,500 mg a day.  When on the DASH eating plan, aim to eat more fresh fruits and vegetables, whole grains, lean proteins, low-fat dairy, and heart-healthy fats.  Work with your health care provider or diet and nutrition specialist (dietitian) to adjust your eating plan to your individual calorie needs. This information is not intended to replace advice given to you by your health care provider. Make sure you discuss any questions you have with your health care provider. Document Released: 02/17/2011 Document Revised: 02/22/2016 Document Reviewed: 02/22/2016 Elsevier Interactive Patient Education  2017 [employer].

## 2016-11-07 NOTE — Assessment & Plan Note (Signed)
Reviewed with her; goal LDL less than 70; tolerating statin fine; plan to recheck fasting lipid panel 6-8 weeks after initiation of statin; try to limit saturated fats

## 2016-11-08 ENCOUNTER — Telehealth: Payer: Self-pay | Admitting: Family Medicine

## 2016-11-08 ENCOUNTER — Other Ambulatory Visit: Payer: Self-pay | Admitting: Family Medicine

## 2016-11-08 LAB — BASIC METABOLIC PANEL
BUN: 2 mg/dL — ABNORMAL LOW (ref 7–25)
CALCIUM: 8.8 mg/dL (ref 8.6–10.4)
CO2: 24 mmol/L (ref 20–32)
Chloride: 105 mmol/L (ref 98–110)
Creat: 0.63 mg/dL (ref 0.50–1.05)
Glucose, Bld: 87 mg/dL (ref 65–99)
POTASSIUM: 3.1 mmol/L — AB (ref 3.5–5.3)
SODIUM: 142 mmol/L (ref 135–146)

## 2016-11-08 LAB — MAGNESIUM: MAGNESIUM: 1.2 mg/dL — AB (ref 1.5–2.5)

## 2016-11-08 NOTE — Telephone Encounter (Signed)
Left detailed voicemail

## 2016-11-08 NOTE — Telephone Encounter (Signed)
Pt would like to know if Dr Sanda Klein was going to put her on a fluid pill. States she had talked about it but its not on the patient list. Uses [facility]. Pt can be reached at [phone]

## 2016-11-08 NOTE — Progress Notes (Signed)
Low Mg2+ and low K One of the 250 mg Mg pills TID for 5 days, then one daily Take two of the 99 mg potassium pills BID for 3 days, then stop Be liberal with potassium-rich foods Return next week for labs and visit with Raquel Sarna

## 2016-11-08 NOTE — Telephone Encounter (Signed)
Yes, the new blood pressure medicine includes a "fluid pill" so to speak; it will help her blood pressure and also help get rid of some fluid; it's the hydrochlorothiazide part of the lisinopril-hctz combo Thank you

## 2016-11-17 ENCOUNTER — Ambulatory Visit (INDEPENDENT_AMBULATORY_CARE_PROVIDER_SITE_OTHER): Payer: Self-pay | Admitting: Family Medicine

## 2016-11-17 ENCOUNTER — Encounter: Payer: Self-pay | Admitting: Family Medicine

## 2016-11-17 VITALS — BP 142/78 | HR 93 | Temp 97.7°F | Resp 16 | Ht 62.0 in | Wt 113.4 lb

## 2016-11-17 DIAGNOSIS — D649 Anemia, unspecified: Secondary | ICD-10-CM

## 2016-11-17 DIAGNOSIS — I10 Essential (primary) hypertension: Secondary | ICD-10-CM

## 2016-11-17 DIAGNOSIS — M79621 Pain in right upper arm: Secondary | ICD-10-CM

## 2016-11-17 DIAGNOSIS — E876 Hypokalemia: Secondary | ICD-10-CM

## 2016-11-17 DIAGNOSIS — D473 Essential (hemorrhagic) thrombocythemia: Secondary | ICD-10-CM

## 2016-11-17 DIAGNOSIS — D75839 Thrombocytosis, unspecified: Secondary | ICD-10-CM

## 2016-11-17 DIAGNOSIS — Z5181 Encounter for therapeutic drug level monitoring: Secondary | ICD-10-CM

## 2016-11-17 DIAGNOSIS — R7989 Other specified abnormal findings of blood chemistry: Secondary | ICD-10-CM

## 2016-11-17 LAB — MAGNESIUM: Magnesium: 1.5 mg/dL (ref 1.5–2.5)

## 2016-11-17 LAB — CBC
HEMATOCRIT: 36.4 % (ref 35.0–45.0)
Hemoglobin: 12.5 g/dL (ref 11.7–15.5)
MCH: 32.1 pg (ref 27.0–33.0)
MCHC: 34.3 g/dL (ref 32.0–36.0)
MCV: 93.6 fL (ref 80.0–100.0)
MPV: 11.1 fL (ref 7.5–12.5)
Platelets: 284 10*3/uL (ref 140–400)
RBC: 3.89 10*6/uL (ref 3.80–5.10)
RDW: 12.1 % (ref 11.0–15.0)
WBC: 7.5 10*3/uL (ref 3.8–10.8)

## 2016-11-17 LAB — BASIC METABOLIC PANEL WITH GFR
BUN / CREAT RATIO: 7 (calc) (ref 6–22)
BUN: 5 mg/dL — ABNORMAL LOW (ref 7–25)
CO2: 30 mmol/L (ref 20–32)
CREATININE: 0.74 mg/dL (ref 0.50–1.05)
Calcium: 9.4 mg/dL (ref 8.6–10.4)
Chloride: 95 mmol/L — ABNORMAL LOW (ref 98–110)
GFR, EST AFRICAN AMERICAN: 105 mL/min/{1.73_m2} (ref >=60)
GFR, Est Non African American: 91 mL/min/{1.73_m2} (ref >=60)
GLUCOSE: 74 mg/dL (ref 65–139)
Potassium: 3.9 mmol/L (ref 3.5–5.3)
Sodium: 134 mmol/L — ABNORMAL LOW (ref 135–146)

## 2016-11-17 LAB — FERRITIN: FERRITIN: 282 ng/mL — AB (ref 10–232)

## 2016-11-17 NOTE — Progress Notes (Addendum)
Name: Yvonne Scott   MRN: [id number]    DOB: 02/18/1961   Date:11/17/2016       Progress Note  Subjective  Chief Complaint  Chief Complaint  Patient presents with  . Follow-up    elevated blood pressure    HPI  Patient presents for BP follow up. She has been progressively improving - swelling in BLE has gone away completely.  She is taking Lisinopril-HCTZ 10-12.84m (1/2 tablet daily) - she still has a few tablets left, so we will wait to provide a refill until labs are returned.  11/07/2016 labs found Magnesium at 1.2 and potassium at 3.1 - we will recheck these labs today per Dr. LDelight Ovens(PCP) request.  ELadon Applebaumvery mild chest pain under her breasts, but it is much improved from last visit; also mild shortness of breath with activity - states this is baseline for her.  Does not check BP at home, but does have cuff if she needs to monitor.  RIGHT Arm Pain: She had a blood pressure cuff "pop" on her RUE while she was in the hospital and her upper arm began to swell immediately afterwards. The swelling has gone down since then, but she is still having residual pain. She also had an IV in her RIGHT AC.  Pain occurs constantly but waxes and wanes - advil helps the pain most of the time.  We will continue to monitor.  Elevated platelets and iron levels: These were elevated while hospitalized and with visit on 10/24/2016 - suspected to be reactionary related to illness/inflammation. PCP Dr. LSanda Kleinhas placed orders for this and we will recheck today.  Patient Active Problem List   Diagnosis Date Noted  . Thrombocytosis ([place] 11/07/2016  . Elevated ferritin 11/07/2016  . Aortic atherosclerosis ([place] 10/24/2016  . Fatty liver 10/24/2016  . Anemia 10/24/2016  . Ecchymoses, spontaneous 10/24/2016  . Metabolic acidosis 015/40/0867 . Need for hepatitis C screening test 11/19/2015  . Medication monitoring encounter 11/19/2015  . GERD (gastroesophageal reflux disease) 11/19/2015  . Tobacco abuse  11/19/2015  . Essential hypertension, benign 11/19/2015    No past surgical history on file.  Family History  Problem Relation Age of Onset  . Cancer Mother 359      breast  . Head & neck cancer Father   . Cancer Father        melonma  . Cirrhosis Maternal Grandfather   . Cancer Paternal Grandmother        skin/breast    Social History   Social History  . Marital status: Widowed    Spouse name: N/A  . Number of children: N/A  . Years of education: N/A   Occupational History  . Not on file.   Social History Main Topics  . Smoking status: Current Every Day Smoker    Packs/day: 1.00    Years: 36.00    Types: Cigarettes    Start date: 03/15/1979  . Smokeless tobacco: Never Used  . Alcohol use No  . Drug use: No  . Sexual activity: Not Currently   Other Topics Concern  . Not on file   Social History Narrative  . No narrative on file     Current Outpatient Prescriptions:  .  atorvastatin (LIPITOR) 40 MG tablet, Take 1 tablet (40 mg total) by mouth at bedtime., Disp: 30 tablet, Rfl: 1 .  fluticasone (FLONASE) 50 MCG/ACT nasal spray, Place 1 spray into both nostrils daily., Disp: 16 g, Rfl: 1 .  lisinopril-hydrochlorothiazide (  PRINZIDE,ZESTORETIC) 10-12.5 MG tablet, Take 0.5 tablets by mouth daily., Disp: 15 tablet, Rfl: 0 .  loratadine (CLARITIN) 10 MG tablet, Take 1 tablet (10 mg total) by mouth daily as needed for allergies., Disp: 30 tablet, Rfl: 5 .  pantoprazole (PROTONIX) 40 MG tablet, Take 1 tablet (40 mg total) by mouth 2 (two) times daily., Disp: 60 tablet, Rfl: 2 .  guaiFENesin (MUCINEX) 600 MG 12 hr tablet, Take 2 tablets (1,200 mg total) by mouth 2 (two) times daily., Disp: 60 tablet, Rfl: 0  No Known Allergies   ROS  Ten systems reviewed and is negative except as mentioned in HPI  Objective  Vitals:   11/17/16 1326  BP: (!) 142/78  Pulse: 93  Resp: 16  Temp: 97.7 F (36.5 C)  TempSrc: Oral  SpO2: 97%  Weight: 113 lb 6.4 oz (51.4 kg)   Height: _0  (1.575 m)   Body mass index is 20.74 kg/m.  BP is mildly elevated but much improved from her last visit, we will maintain current dosing of medication.  Physical Exam Constitutional: Patient appears well-developed and well-nourished. No distress.  HENT: Head: Normocephalic and atraumatic.  Nose: Nose normal. Mouth/Throat: Oropharynx is clear and moist. No oropharyngeal exudate.  Eyes: Conjunctivae and EOM are normal. Pupils are equal, round, and reactive to light. No scleral icterus.  Neck: Normal range of motion. Neck supple. No JVD present. No thyromegaly present.  Cardiovascular: Normal rate, regular rhythm and normal heart sounds.  No murmur heard. No BLE edema. Pulmonary/Chest: Effort normal and breath sounds normal. No respiratory distress. Musculoskeletal: Normal range of motion, no joint effusions. No gross deformities. RIGHT upper arm is tender on palpation - no bony tenderness, full AROM of elbow and shoulder (some pain with AROM of shoulder joint).  Radial pulses +2. Sensation intact bilaterally Neurological: he is alert and oriented to person, place, and time. Coordination, balance, strength, speech and gait are normal.  Skin: Skin is warm and dry. No rash noted. No erythema.  Psychiatric: Patient has a normal mood and affect. behavior is normal. Judgment and thought content normal.  Recent Results (from the past 2160 hour(s))  CBC with Differential     Status: Abnormal   Collection Time: 10/14/16  2:28 PM  Result Value Ref Range   WBC 12.4 (H) 3.6 - 11.0 K/uL   RBC 4.68 3.80 - 5.20 MIL/uL   Hemoglobin 15.8 12.0 - 16.0 g/dL   HCT 49.0 (H) 35.0 - 47.0 %   MCV 104.5 (H) 80.0 - 100.0 fL   MCH 33.8 26.0 - 34.0 pg   MCHC 32.3 32.0 - 36.0 g/dL   RDW 14.5 11.5 - 14.5 %   Platelets 223 150 - 440 K/uL   Neutrophils Relative % 82 %   Neutro Abs 10.2 (H) 1.4 - 6.5 K/uL   Lymphocytes Relative 13 %   Lymphs Abs 1.6 1.0 - 3.6 K/uL   Monocytes Relative 5 %   Monocytes  Absolute 0.6 0.2 - 0.9 K/uL   Eosinophils Relative 0 %   Eosinophils Absolute 0.0 0 - 0.7 K/uL   Basophils Relative 0 %   Basophils Absolute 0.0 0 - 0.1 K/uL  Comprehensive metabolic panel     Status: Abnormal   Collection Time: 10/14/16  2:28 PM  Result Value Ref Range   Sodium 136 135 - 145 mmol/L    Comment: LYTES REPEATED [facility] CORRECTED ON 08/03 AT 1621: PREVIOUSLY REPORTED AS 136 LYTES REPEATED    Potassium 4.1 3.5 -  5.1 mmol/L    Comment: HEMOLYSIS AT THIS LEVEL MAY AFFECT RESULT   Chloride 97 (L) 101 - 111 mmol/L   CO2 7 (L) 22 - 32 mmol/L   Glucose, Bld 234 (H) 65 - 99 mg/dL   BUN 11 6 - 20 mg/dL   Creatinine, Ser 1.26 (H) 0.44 - 1.00 mg/dL   Calcium 10.0 8.9 - 10.3 mg/dL   Total Protein 9.9 (H) 6.5 - 8.1 g/dL   Albumin 5.7 (H) 3.5 - 5.0 g/dL   AST 78 (H) 15 - 41 U/L   ALT 63 (H) 14 - 54 U/L   Alkaline Phosphatase 87 38 - 126 U/L   Total Bilirubin 1.8 (H) 0.3 - 1.2 mg/dL   GFR calc non Af Amer 47 (L) >60 mL/min   GFR calc Af Amer 54 (L) >60 mL/min    Comment: (NOTE) The eGFR has been calculated using the CKD EPI equation. This calculation has not been validated in all clinical situations. eGFR's persistently <60 mL/min signify possible Chronic Kidney Disease.    Anion gap 32 (H) 5 - 15  Lipase, blood     Status: None   Collection Time: 10/14/16  2:28 PM  Result Value Ref Range   Lipase 49 11 - 51 U/L  Troponin I     Status: None   Collection Time: 10/14/16  2:28 PM  Result Value Ref Range   Troponin I <0.03 <0.03 ng/mL  Blood gas, venous     Status: Abnormal   Collection Time: 10/14/16  3:59 PM  Result Value Ref Range   pH, Ven 6.94 (LL) 7.250 - 7.430    Comment: CRITICAL RESULT CALLED TO, READ BACK BY AND VERIFIED WITH: Manuela Schwartz RN AT 1630 ON [id number]    pCO2, Ven 28 (L) 44.0 - 60.0 mmHg   pO2, Ven 32.0 32.0 - 45.0 mmHg   Bicarbonate 6.0 (L) 20.0 - 28.0 mmol/L   Acid-base deficit 25.4 (H) 0.0 - 2.0 mmol/L   O2 Saturation 26.9 %   Patient  temperature 37.0    Collection site VEIN    Sample type VEIN   Lactic acid, plasma     Status: Abnormal   Collection Time: 10/14/16  4:10 PM  Result Value Ref Range   Lactic Acid, Venous 2.2 (HH) 0.5 - 1.9 mmol/L    Comment: CRITICAL RESULT CALLED TO, READ BACK BY AND VERIFIED WITH FELICIA STAROPOLIA AT 3154 ON 10/14/16 BY [facility]   Acetaminophen level     Status: Abnormal   Collection Time: 10/14/16  4:45 PM  Result Value Ref Range   Acetaminophen (Tylenol), Serum <10 (L) 10 - 30 ug/mL    Comment:        THERAPEUTIC CONCENTRATIONS VARY SIGNIFICANTLY. A RANGE OF 10-30 ug/mL MAY BE AN EFFECTIVE CONCENTRATION FOR MANY PATIENTS. HOWEVER, SOME ARE BEST TREATED AT CONCENTRATIONS OUTSIDE THIS RANGE. ACETAMINOPHEN CONCENTRATIONS >150 ug/mL AT 4 HOURS AFTER INGESTION AND >50 ug/mL AT 12 HOURS AFTER INGESTION ARE OFTEN ASSOCIATED WITH TOXIC REACTIONS.   Ethanol     Status: None   Collection Time: 10/14/16  4:45 PM  Result Value Ref Range   Alcohol, Ethyl (B) <5 <5 mg/dL    Comment:        LOWEST DETECTABLE LIMIT FOR SERUM ALCOHOL IS 5 mg/dL FOR MEDICAL PURPOSES ONLY   Salicylate level     Status: None   Collection Time: 10/14/16  4:45 PM  Result Value Ref Range   Salicylate Lvl <0.0 2.8 - 30.0  mg/dL  Volatiles,Blood (acetone,ethanol,isoprop,methanol)     Status: Abnormal   Collection Time: 10/14/16  5:17 PM  Result Value Ref Range   Acetone, blood 0.040 (H) 0.000 - 0.010 %    Comment: (NOTE) **Verified by repeat analysis**                                Detection Limit = 0.010    Ethanol, blood Negative 0.000 - 0.010 %    Comment: (NOTE)                                Detection Limit = 0.010 This test was developed and its performance characteristics determined by [facility]. It has not been cleared or approved by the Food and Drug Administration.    Isopropanol, blood Negative 0.000 - 0.010 %    Comment:                                 Detection Limit = 0.010   Methanol,  blood Negative 0.000 - 0.010 %    Comment: (NOTE)                                Detection Limit = 0.010 Performed At: [facility] [place], [place] [id number] Lindon Romp MD TU:[id number]   Blood gas, arterial     Status: Abnormal   Collection Time: 10/14/16  5:47 PM  Result Value Ref Range   pH, Arterial 7.21 (L) 7.350 - 7.450   pCO2 arterial 19 (LL) 32.0 - 48.0 mmHg    Comment: CRITICAL RESULT CALLED TO, READ BACK BY AND VERIFIED WITH: STACY COLLIE RN ON [id number] AT 1825    pO2, Arterial 104 83.0 - 108.0 mmHg   Bicarbonate UNABLE TO CALCULATE. 20.0 - 28.0 mmol/L   Patient temperature 37.0    Collection site LEFT BRACHIAL    Sample type ARTERIAL DRAW    Allens test (pass/fail) PASS PASS  Glucose, capillary     Status: Abnormal   Collection Time: 10/14/16  6:25 PM  Result Value Ref Range   Glucose-Capillary 134 (H) 65 - 99 mg/dL  HIV antibody (Routine Testing)     Status: None   Collection Time: 10/14/16  6:52 PM  Result Value Ref Range   HIV Screen 4th Generation wRfx Non Reactive Non Reactive    Comment: (NOTE) Performed At: [facility] [place], [place] [id number] Lindon Romp MD VZ:[id number]   Lactic acid, plasma     Status: None   Collection Time: 10/14/16  6:54 PM  Result Value Ref Range   Lactic Acid, Venous 1.2 0.5 - 1.9 mmol/L  Basic metabolic panel     Status: Abnormal   Collection Time: 10/14/16  6:54 PM  Result Value Ref Range   Sodium 140 135 - 145 mmol/L   Potassium 4.3 3.5 - 5.1 mmol/L   Chloride 108 101 - 111 mmol/L   CO2 12 (L) 22 - 32 mmol/L   Glucose, Bld 127 (H) 65 - 99 mg/dL   BUN 8 6 - 20 mg/dL   Creatinine, Ser 0.98 0.44 - 1.00 mg/dL   Calcium 8.2 (L) 8.9 - 10.3 mg/dL   GFR calc non Af Amer >60 >60  mL/min   GFR calc Af Amer >60 >60 mL/min    Comment: (NOTE) The eGFR has been calculated using the CKD EPI equation. This calculation has not been validated in all clinical  situations. eGFR's persistently <60 mL/min signify possible Chronic Kidney Disease.    Anion gap 20 (H) 5 - 15  Osmolality     Status: Abnormal   Collection Time: 10/14/16  6:54 PM  Result Value Ref Range   Osmolality 304 (H) 275 - 295 mOsm/kg  Magnesium     Status: Abnormal   Collection Time: 10/14/16  6:54 PM  Result Value Ref Range   Magnesium 1.5 (L) 1.7 - 2.4 mg/dL  Phosphorus     Status: Abnormal   Collection Time: 10/14/16  6:54 PM  Result Value Ref Range   Phosphorus 1.3 (L) 2.5 - 4.6 mg/dL  Protime-INR     Status: None   Collection Time: 10/14/16  6:54 PM  Result Value Ref Range   Prothrombin Time 13.7 11.4 - 15.2 seconds   INR 1.05   Procalcitonin - Baseline     Status: None   Collection Time: 10/14/16  6:54 PM  Result Value Ref Range   Procalcitonin <0.10 ng/mL    Comment:        Interpretation: PCT (Procalcitonin) <= 0.5 ng/mL: Systemic infection (sepsis) is not likely. Local bacterial infection is possible. (NOTE)         ICU PCT Algorithm               Non ICU PCT Algorithm    ----------------------------     ------------------------------         PCT < 0.25 ng/mL                 PCT < 0.1 ng/mL     Stopping of antibiotics            Stopping of antibiotics       strongly encouraged.               strongly encouraged.    ----------------------------     ------------------------------       PCT level decrease by               PCT < 0.25 ng/mL       >= 80% from peak PCT       OR PCT 0.25 - 0.5 ng/mL          Stopping of antibiotics                                             encouraged.     Stopping of antibiotics           encouraged.    ----------------------------     ------------------------------       PCT level decrease by              PCT >= 0.25 ng/mL       < 80% from peak PCT        AND PCT >= 0.5 ng/mL            Continuin g antibiotics  encouraged.       Continuing antibiotics             encouraged.    ----------------------------     ------------------------------     PCT level increase compared          PCT > 0.5 ng/mL         with peak PCT AND          PCT >= 0.5 ng/mL             Escalation of antibiotics                                          strongly encouraged.      Escalation of antibiotics        strongly encouraged.   Blood gas, arterial     Status: Abnormal   Collection Time: 10/14/16  7:17 PM  Result Value Ref Range   FIO2 0.21    pH, Arterial 7.23 (L) 7.350 - 7.450   pCO2 arterial 19 (LL) 32.0 - 48.0 mmHg    Comment: CRITICAL RESULT, NOTIFIED PHYSICIAN D BLAKENEY NP [id number] 1945 DT    pO2, Arterial 104 83.0 - 108.0 mmHg   Patient temperature 37.0    Collection site REVIEWED BY    Sample type ARTERIAL DRAW    Allens test (pass/fail) PASS PASS  Glucose, capillary     Status: None   Collection Time: 10/14/16  8:26 PM  Result Value Ref Range   Glucose-Capillary 90 65 - 99 mg/dL   Comment 1 Notify RN    Comment 2 Document in Chart   Urine Drug Screen, Qualitative ([facility] only)     Status: Abnormal   Collection Time: 10/14/16  8:35 PM  Result Value Ref Range   Tricyclic, Ur Screen NONE DETECTED NONE DETECTED   Amphetamines, Ur Screen NONE DETECTED NONE DETECTED   MDMA (Ecstasy)Ur Screen NONE DETECTED NONE DETECTED   Cocaine Metabolite,Ur [place] NONE DETECTED NONE DETECTED   Opiate, Ur Screen POSITIVE (A) NONE DETECTED   Phencyclidine (PCP) Ur S NONE DETECTED NONE DETECTED   Cannabinoid 50 Ng, Ur [place] NONE DETECTED NONE DETECTED   Barbiturates, Ur Screen NONE DETECTED NONE DETECTED   Benzodiazepine, Ur Scrn NONE DETECTED NONE DETECTED   Methadone Scn, Ur NONE DETECTED NONE DETECTED    Comment: (NOTE) 845  Tricyclics, urine               Cutoff 1000 ng/mL 200  Amphetamines, urine             Cutoff 1000 ng/mL 300  MDMA (Ecstasy), urine           Cutoff 500 ng/mL 400  Cocaine Metabolite, urine       Cutoff 300 ng/mL 500  Opiate, urine                    Cutoff 300 ng/mL 600  Phencyclidine (PCP), urine      Cutoff 25 ng/mL 700  Cannabinoid, urine              Cutoff 50 ng/mL 800  Barbiturates, urine             Cutoff 200 ng/mL 900  Benzodiazepine, urine           Cutoff 200 ng/mL 1000 Methadone, urine  Cutoff 300 ng/mL 1100 1200 The urine drug screen provides only a preliminary, unconfirmed 1300 analytical test result and should not be used for non-medical 1400 purposes. Clinical consideration and professional judgment should 1500 be applied to any positive drug screen result due to possible 1600 interfering substances. A more specific alternate chemical method 1700 must be used in order to obtain a confirmed analytical result.  1800 Gas chromato graphy / mass spectrometry (GC/MS) is the preferred 1900 confirmatory method.   Urinalysis, Routine w reflex microscopic     Status: Abnormal   Collection Time: 10/14/16  8:35 PM  Result Value Ref Range   Color, Urine YELLOW (A) YELLOW   APPearance CLEAR (A) CLEAR   Specific Gravity, Urine 1.013 1.005 - 1.030   pH 6.0 5.0 - 8.0   Glucose, UA 50 (A) NEGATIVE mg/dL   Hgb urine dipstick MODERATE (A) NEGATIVE   Bilirubin Urine NEGATIVE NEGATIVE   Ketones, ur 80 (A) NEGATIVE mg/dL   Protein, ur 100 (A) NEGATIVE mg/dL   Nitrite NEGATIVE NEGATIVE   Leukocytes, UA NEGATIVE NEGATIVE   RBC / HPF 0-5 0 - 5 RBC/hpf   WBC, UA 0-5 0 - 5 WBC/hpf   Bacteria, UA NONE SEEN NONE SEEN   Squamous Epithelial / LPF 0-5 (A) NONE SEEN   Mucus PRESENT   Blood gas, arterial     Status: Abnormal   Collection Time: 10/14/16 11:59 PM  Result Value Ref Range   FIO2 0.21    pH, Arterial 7.50 (H) 7.350 - 7.450   pCO2 arterial 28 (L) 32.0 - 48.0 mmHg   pO2, Arterial 76 (L) 83.0 - 108.0 mmHg   Bicarbonate 21.8 20.0 - 28.0 mmol/L   Acid-base deficit 0.5 0.0 - 2.0 mmol/L   O2 Saturation 96.3 %   Patient temperature 37.0    Collection site RIGHT RADIAL    Sample type ARTERIAL DRAW    Allens test  (pass/fail) PASS PASS  Glucose, capillary     Status: Abnormal   Collection Time: 10/15/16 12:42 AM  Result Value Ref Range   Glucose-Capillary 291 (H) 65 - 99 mg/dL   Comment 1 Notify RN    Comment 2 Document in Chart   CBC     Status: Abnormal   Collection Time: 10/15/16  4:37 AM  Result Value Ref Range   WBC 4.7 3.6 - 11.0 K/uL   RBC 3.19 (L) 3.80 - 5.20 MIL/uL   Hemoglobin 11.0 (L) 12.0 - 16.0 g/dL   HCT 30.9 (L) 35.0 - 47.0 %   MCV 96.9 80.0 - 100.0 fL   MCH 34.6 (H) 26.0 - 34.0 pg   MCHC 35.7 32.0 - 36.0 g/dL   RDW 13.6 11.5 - 14.5 %   Platelets 134 (L) 150 - 440 K/uL  Comprehensive metabolic panel     Status: Abnormal   Collection Time: 10/15/16  4:37 AM  Result Value Ref Range   Sodium 145 135 - 145 mmol/L   Potassium <2.0 (LL) 3.5 - 5.1 mmol/L    Comment: CRITICAL RESULT CALLED TO, READ BACK BY AND VERIFIED WITH: BRITTON LEE RUST CHESTER AT 0630 10/15/16 HKP/DAS    Chloride 97 (L) 101 - 111 mmol/L   CO2 35 (H) 22 - 32 mmol/L   Glucose, Bld 224 (H) 65 - 99 mg/dL   BUN 5 (L) 6 - 20 mg/dL   Creatinine, Ser 0.53 0.44 - 1.00 mg/dL   Calcium 7.4 (L) 8.9 - 10.3 mg/dL   Total Protein 5.5 (  L) 6.5 - 8.1 g/dL   Albumin 3.2 (L) 3.5 - 5.0 g/dL   AST 42 (H) 15 - 41 U/L   ALT 33 14 - 54 U/L   Alkaline Phosphatase 46 38 - 126 U/L   Total Bilirubin 1.3 (H) 0.3 - 1.2 mg/dL   GFR calc non Af Amer >60 >60 mL/min   GFR calc Af Amer >60 >60 mL/min    Comment: (NOTE) The eGFR has been calculated using the CKD EPI equation. This calculation has not been validated in all clinical situations. eGFR's persistently <60 mL/min signify possible Chronic Kidney Disease.    Anion gap 13 5 - 15  Phosphorus     Status: None   Collection Time: 10/15/16  4:37 AM  Result Value Ref Range   Phosphorus 3.3 2.5 - 4.6 mg/dL  Magnesium     Status: None   Collection Time: 10/15/16  4:37 AM  Result Value Ref Range   Magnesium 2.0 1.7 - 2.4 mg/dL  Glucose, capillary     Status: Abnormal   Collection  Time: 10/15/16  5:10 AM  Result Value Ref Range   Glucose-Capillary 217 (H) 65 - 99 mg/dL  Glucose, capillary     Status: Abnormal   Collection Time: 10/15/16  7:55 AM  Result Value Ref Range   Glucose-Capillary 145 (H) 65 - 99 mg/dL  Glucose, capillary     Status: Abnormal   Collection Time: 10/15/16 11:56 AM  Result Value Ref Range   Glucose-Capillary 43 (LL) 65 - 99 mg/dL    Comment: REPEATED TO VERIFY, CHARGE CREDITED Performed at [facility], [address], [place], [place] [address]   Glucose, capillary     Status: Abnormal   Collection Time: 10/15/16 11:58 AM  Result Value Ref Range   Glucose-Capillary 39 (LL) 65 - 99 mg/dL   Comment 1 Notify RN   Glucose, capillary     Status: Abnormal   Collection Time: 10/15/16 12:31 PM  Result Value Ref Range   Glucose-Capillary 136 (H) 65 - 99 mg/dL  Glucose, capillary     Status: Abnormal   Collection Time: 10/15/16  4:00 PM  Result Value Ref Range   Glucose-Capillary 130 (H) 65 - 99 mg/dL  Basic metabolic panel     Status: Abnormal   Collection Time: 10/15/16  4:56 PM  Result Value Ref Range   Sodium 140 135 - 145 mmol/L   Potassium 2.3 (LL) 3.5 - 5.1 mmol/L    Comment: CRITICAL RESULT CALLED TO, READ BACK BY AND VERIFIED WITH ADAM SCARBORO 10/15/16 @ 1740  MLK    Chloride 98 (L) 101 - 111 mmol/L   CO2 32 22 - 32 mmol/L   Glucose, Bld 117 (H) 65 - 99 mg/dL   BUN <5 (L) 6 - 20 mg/dL   Creatinine, Ser 0.54 0.44 - 1.00 mg/dL   Calcium 7.1 (L) 8.9 - 10.3 mg/dL   GFR calc non Af Amer >60 >60 mL/min   GFR calc Af Amer >60 >60 mL/min    Comment: (NOTE) The eGFR has been calculated using the CKD EPI equation. This calculation has not been validated in all clinical situations. eGFR's persistently <60 mL/min signify possible Chronic Kidney Disease.    Anion gap 10 5 - 15  Glucose, capillary     Status: Abnormal   Collection Time: 10/15/16  8:00 PM  Result Value Ref Range   Glucose-Capillary 157 (H) 65 - 99 mg/dL   Glucose, capillary  Status: Abnormal   Collection Time: 10/15/16 11:47 PM  Result Value Ref Range   Glucose-Capillary 50 (L) 65 - 99 mg/dL  Glucose, capillary     Status: Abnormal   Collection Time: 10/16/16 12:17 AM  Result Value Ref Range   Glucose-Capillary 110 (H) 65 - 99 mg/dL  Glucose, capillary     Status: None   Collection Time: 10/16/16  4:23 AM  Result Value Ref Range   Glucose-Capillary 94 65 - 99 mg/dL  Procalcitonin     Status: None   Collection Time: 10/16/16  4:49 AM  Result Value Ref Range   Procalcitonin <0.10 ng/mL    Comment:        Interpretation: PCT (Procalcitonin) <= 0.5 ng/mL: Systemic infection (sepsis) is not likely. Local bacterial infection is possible. (NOTE)         ICU PCT Algorithm               Non ICU PCT Algorithm    ----------------------------     ------------------------------         PCT < 0.25 ng/mL                 PCT < 0.1 ng/mL     Stopping of antibiotics            Stopping of antibiotics       strongly encouraged.               strongly encouraged.    ----------------------------     ------------------------------       PCT level decrease by               PCT < 0.25 ng/mL       >= 80% from peak PCT       OR PCT 0.25 - 0.5 ng/mL          Stopping of antibiotics                                             encouraged.     Stopping of antibiotics           encouraged.    ----------------------------     ------------------------------       PCT level decrease by              PCT >= 0.25 ng/mL       < 80% from peak PCT        AND PCT >= 0.5 ng/mL            Continuin g antibiotics                                              encouraged.       Continuing antibiotics            encouraged.    ----------------------------     ------------------------------     PCT level increase compared          PCT > 0.5 ng/mL         with peak PCT AND          PCT >= 0.5 ng/mL             Escalation of antibiotics  strongly encouraged.      Escalation of antibiotics        strongly encouraged.   Basic metabolic panel     Status: Abnormal   Collection Time: 10/16/16  4:49 AM  Result Value Ref Range   Sodium 141 135 - 145 mmol/L   Potassium 3.3 (L) 3.5 - 5.1 mmol/L   Chloride 103 101 - 111 mmol/L   CO2 32 22 - 32 mmol/L   Glucose, Bld 88 65 - 99 mg/dL   BUN <5 (L) 6 - 20 mg/dL   Creatinine, Ser 0.54 0.44 - 1.00 mg/dL   Calcium 7.2 (L) 8.9 - 10.3 mg/dL   GFR calc non Af Amer >60 >60 mL/min   GFR calc Af Amer >60 >60 mL/min    Comment: (NOTE) The eGFR has been calculated using the CKD EPI equation. This calculation has not been validated in all clinical situations. eGFR's persistently <60 mL/min signify possible Chronic Kidney Disease.    Anion gap 6 5 - 15  CBC     Status: Abnormal   Collection Time: 10/16/16  4:49 AM  Result Value Ref Range   WBC 6.4 3.6 - 11.0 K/uL   RBC 3.37 (L) 3.80 - 5.20 MIL/uL   Hemoglobin 11.5 (L) 12.0 - 16.0 g/dL   HCT 32.6 (L) 35.0 - 47.0 %   MCV 96.9 80.0 - 100.0 fL   MCH 34.1 (H) 26.0 - 34.0 pg   MCHC 35.2 32.0 - 36.0 g/dL   RDW 13.9 11.5 - 14.5 %   Platelets 116 (L) 150 - 440 K/uL  Urinalysis, Routine w reflex microscopic     Status: Abnormal   Collection Time: 10/16/16  6:34 AM  Result Value Ref Range   Color, Urine YELLOW (A) YELLOW   APPearance CLEAR (A) CLEAR   Specific Gravity, Urine 1.003 (L) 1.005 - 1.030   pH 7.0 5.0 - 8.0   Glucose, UA NEGATIVE NEGATIVE mg/dL   Hgb urine dipstick NEGATIVE NEGATIVE   Bilirubin Urine NEGATIVE NEGATIVE   Ketones, ur NEGATIVE NEGATIVE mg/dL   Protein, ur NEGATIVE NEGATIVE mg/dL   Nitrite NEGATIVE NEGATIVE   Leukocytes, UA NEGATIVE NEGATIVE  Protein / creatinine ratio, urine     Status: None   Collection Time: 10/16/16  6:34 AM  Result Value Ref Range   Creatinine, Urine 36 mg/dL   Total Protein, Urine <6 mg/dL    Comment: NO NORMAL RANGE ESTABLISHED FOR THIS TEST   Protein Creatinine Ratio         0.00 - 0.15 mg/mg[Cre]    Comment: RESULT BELOW REPORTABLE RANGE, UNABLE TO CALCULATE.   Glucose, capillary     Status: None   Collection Time: 10/16/16  8:02 AM  Result Value Ref Range   Glucose-Capillary 85 65 - 99 mg/dL  Glucose, capillary     Status: Abnormal   Collection Time: 10/16/16  3:35 PM  Result Value Ref Range   Glucose-Capillary 134 (H) 65 - 99 mg/dL  Glucose, capillary     Status: Abnormal   Collection Time: 10/16/16  8:25 PM  Result Value Ref Range   Glucose-Capillary 119 (H) 65 - 99 mg/dL  Glucose, capillary     Status: None   Collection Time: 10/16/16 11:58 PM  Result Value Ref Range   Glucose-Capillary 87 65 - 99 mg/dL  Glucose, capillary     Status: Abnormal   Collection Time: 10/17/16  4:03 AM  Result Value Ref Range   Glucose-Capillary 101 (H) 65 -  99 mg/dL  Basic metabolic panel     Status: Abnormal   Collection Time: 10/17/16  5:16 AM  Result Value Ref Range   Sodium 143 135 - 145 mmol/L   Potassium 3.7 3.5 - 5.1 mmol/L   Chloride 106 101 - 111 mmol/L   CO2 29 22 - 32 mmol/L   Glucose, Bld 97 65 - 99 mg/dL   BUN <5 (L) 6 - 20 mg/dL   Creatinine, Ser 0.48 0.44 - 1.00 mg/dL   Calcium 8.5 (L) 8.9 - 10.3 mg/dL   GFR calc non Af Amer >60 >60 mL/min   GFR calc Af Amer >60 >60 mL/min    Comment: (NOTE) The eGFR has been calculated using the CKD EPI equation. This calculation has not been validated in all clinical situations. eGFR's persistently <60 mL/min signify possible Chronic Kidney Disease.    Anion gap 8 5 - 15  Insulin and C-Peptide     Status: Abnormal   Collection Time: 10/17/16  5:16 AM  Result Value Ref Range   Insulin 2.5 (L) 2.6 - 24.9 uIU/mL   C-Peptide 2.3 1.1 - 4.4 ng/mL    Comment: (NOTE) C-Peptide reference interval is for fasting patients. Performed At: [facility] [place], [place] [id number] Lindon Romp MD BJ:[id number]   Glucose, capillary     Status: None   Collection Time: 10/17/16  8:52 AM   Result Value Ref Range   Glucose-Capillary 96 65 - 99 mg/dL  Hepatitis panel, acute     Status: None   Collection Time: 10/24/16  3:34 PM  Result Value Ref Range   Hepatitis B Surface Ag NON-REACTIVE NON-REACTIVE   HCV Ab NON-REACTIVE NON-REACTIVE   Hep B C IgM NON REACTIVE NON-REACTIVE   Hep A IgM NON-REACTIVE NON-REACTIVE    Comment:   Effective January 27, 2014, Hepatitis Acute Panel (test code [phone]) will be revised to automatically reflex to the Hepatitis C Viral RNA, Quantitative, Real-Time PCR assay if the Hepatitis C antibody screening result is Reactive. This action is being taken to ensure that the CDC/USPSTF recommended HCV diagnostic algorithm with the appropriate test reflex needed for accurate interpretation is followed.     CBC with Differential/Platelet     Status: Abnormal   Collection Time: 10/24/16  3:34 PM  Result Value Ref Range   WBC 6.5 3.8 - 10.8 K/uL   RBC 3.61 (L) 3.80 - 5.10 MIL/uL   Hemoglobin 12.0 11.7 - 15.5 g/dL   HCT 35.4 35.0 - 45.0 %   MCV 98.1 80.0 - 100.0 fL   MCH 33.2 (H) 27.0 - 33.0 pg   MCHC 33.9 32.0 - 36.0 g/dL   RDW 14.0 11.0 - 15.0 %   Platelets 439 (H) 140 - 400 K/uL   MPV 10.3 7.5 - 12.5 fL   Neutro Abs 3,575 1,500 - 7,800 cells/uL   Lymphs Abs 2,080 850 - 3,900 cells/uL   Monocytes Absolute 650 200 - 950 cells/uL   Eosinophils Absolute 130 15 - 500 cells/uL   Basophils Absolute 65 0 - 200 cells/uL   Neutrophils Relative % 55 %   Lymphocytes Relative 32 %   Monocytes Relative 10 %   Eosinophils Relative 2 %   Basophils Relative 1 %   Smear Review Criteria for review not met   COMPLETE METABOLIC PANEL WITH GFR     Status: Abnormal   Collection Time: 10/24/16  3:34 PM  Result Value Ref Range   Sodium 142 135 -  146 mmol/L   Potassium 3.1 (L) 3.5 - 5.3 mmol/L   Chloride 102 98 - 110 mmol/L   CO2 26 20 - 32 mmol/L    Comment: ** Please note change in reference range(s). **      Glucose, Bld 85 65 - 99 mg/dL   BUN 3 (L) 7  - 25 mg/dL   Creat 0.65 0.50 - 1.05 mg/dL    Comment:   For patients > or = 57 years of age: The upper reference limit for Creatinine is approximately 13% higher for people identified as African-American.      Total Bilirubin 0.4 0.2 - 1.2 mg/dL   Alkaline Phosphatase 67 33 - 130 U/L   AST 21 10 - 35 U/L   ALT 22 6 - 29 U/L   Total Protein 7.0 6.1 - 8.1 g/dL   Albumin 4.3 3.6 - 5.1 g/dL   Calcium 8.9 8.6 - 10.4 mg/dL   GFR, Est African American >89 >=60 mL/min   GFR, Est Non African American >89 >=60 mL/min  Lipid panel     Status: Abnormal   Collection Time: 10/24/16  3:34 PM  Result Value Ref Range   Cholesterol 263 (H) <200 mg/dL   Triglycerides 181 (H) <150 mg/dL   HDL 77 >50 mg/dL   Total CHOL/HDL Ratio 3.4 <5.0 Ratio   VLDL 36 (H) <30 mg/dL   LDL Cholesterol 150 (H) <100 mg/dL  INR/PT     Status: None   Collection Time: 10/24/16  3:34 PM  Result Value Ref Range   Prothrombin Time 11.2 9.0 - 11.5 sec    Comment:   For more information on this test, go to: http://education.[facility].com/faq/FAQ104      INR 1.1     Comment:   Reference Range                        0.9-1.1 Moderate-intensity Warfarin Therapy    2.0-3.0 Higher-intensity Warfarin Therapy      3.0-4.0     PTT     Status: None   Collection Time: 10/24/16  3:34 PM  Result Value Ref Range   aPTT 28 22 - 34 sec    Comment:   This test has not been validated for monitoring unfractionated heparin therapy. For testing that is validated for this type of therapy, please refer to the Heparin Anti-Xa assay (test code [phone]).   For additional information, please refer to http://education.[facility].com/faq/FAQ159 (This link is being provided for informational/educational purposes only.)     Fe+TIBC+Fer     Status: Abnormal   Collection Time: 10/24/16  3:34 PM  Result Value Ref Range   Ferritin 540 (H) 10 - 232 ng/mL   Iron 80 45 - 160 ug/dL   TIBC 282 250 - 450 ug/dL   %SAT 28 11 - 50 %   B12     Status: None   Collection Time: 10/24/16  3:34 PM  Result Value Ref Range   Vitamin B-12 300 200 - 1,100 pg/mL  Folate     Status: None   Collection Time: 10/24/16  3:34 PM  Result Value Ref Range   Folate 7.8 >5.4 ng/mL    Comment: Reference Range >17 years:   Low: <3.4 ng/mL              Borderline: 3.4-5.4 ng/mL              Normal: >5.4 ng/mL     Basic Metabolic Panel (  BMET)     Status: Abnormal   Collection Time: 11/07/16  3:56 PM  Result Value Ref Range   Sodium 142 135 - 146 mmol/L   Potassium 3.1 (L) 3.5 - 5.3 mmol/L   Chloride 105 98 - 110 mmol/L   CO2 24 20 - 32 mmol/L    Comment: ** Please note change in reference range(s). **      Glucose, Bld 87 65 - 99 mg/dL   BUN 2 (L) 7 - 25 mg/dL   Creat 0.63 0.50 - 1.05 mg/dL    Comment:   For patients > or = 57 years of age: The upper reference limit for Creatinine is approximately 13% higher for people identified as African-American.      Calcium 8.8 8.6 - 10.4 mg/dL  Magnesium     Status: Abnormal   Collection Time: 11/07/16  3:56 PM  Result Value Ref Range   Magnesium 1.2 (L) 1.5 - 2.5 mg/dL   PHQ2/9: Depression screen [facility] 2/9 11/17/2016 11/07/2016 10/24/2016 11/19/2015  Decreased Interest 0 0 0 2  Down, Depressed, Hopeless 0 0 1 2  PHQ - 2 Score 0 0 1 4  Altered sleeping 1 - - 2  Tired, decreased energy 0 - - 0  Change in appetite 0 - - 0  Feeling bad or failure about yourself  0 - - 0  Trouble concentrating 0 - - 0  Moving slowly or fidgety/restless 0 - - 0  Suicidal thoughts 0 - - 0  PHQ-9 Score 1 - - 6  Difficult doing work/chores Not difficult at all - - Not difficult at all    Assessment & Plan  1. Essential hypertension, benign Continue medications as prescribed, low salt diet. We will check labs today as below.  2. Hypomagnesemia - Magnesium  3. Medication monitoring encounter - Basic Metabolic Panel (BMET)  4. Hypokalemia - Basic Metabolic Panel (BMET)  5. Elevated ferritin -  Ferritin  6. Thrombocytosis (HCC) - CBC  7. Anemia, unspecified type - CBC  8. Pain in right upper arm Discussed allowing arm to heal - good AROM and sensation. She will monitor symptoms and will call if not improving on its own - we will consider imaging vs nerve conduction study at next follow up if not improving.    -Reviewed Health Maintenance: Waiting on insurance before having Mammogram done; Declines TdaP - thinks it has been less than 10 years ago; Having coloscopy next week  I have reviewed this encounter including the documentation in this note and/or discussed this patient with the Johney Maine, FNP, NP-C. I am certifying that I agree with the content of this note as supervising physician.  Steele Sizer, MD [facility] 11/19/2016, 4:43 PM

## 2016-11-21 ENCOUNTER — Telehealth: Payer: Self-pay

## 2016-11-21 NOTE — Telephone Encounter (Signed)
Went over the pt lab results with her.

## 2016-11-24 ENCOUNTER — Ambulatory Visit: Payer: Self-pay | Admitting: Gastroenterology

## 2016-12-05 ENCOUNTER — Other Ambulatory Visit: Payer: Self-pay | Admitting: Family Medicine

## 2016-12-05 MED ORDER — LISINOPRIL-HYDROCHLOROTHIAZIDE 10-12.5 MG PO TABS: 0.5000 | ORAL_TABLET | Freq: Every day | ORAL | 2 refills | Status: DC

## 2016-12-05 NOTE — Telephone Encounter (Signed)
Pt requesting a refill on atorvastatin and lisinopril. She is completely out. Please send to [facility]  She does have upcoming appt for Oct. [phone]

## 2016-12-05 NOTE — Telephone Encounter (Signed)
Reviewed last note, last set of labs; Rx approved

## 2016-12-15 ENCOUNTER — Encounter: Payer: Self-pay | Admitting: Family Medicine

## 2016-12-15 ENCOUNTER — Ambulatory Visit (INDEPENDENT_AMBULATORY_CARE_PROVIDER_SITE_OTHER): Payer: Self-pay | Admitting: Family Medicine

## 2016-12-15 VITALS — BP 136/86 | HR 94 | Temp 97.9°F | Resp 16 | Wt 112.5 lb

## 2016-12-15 DIAGNOSIS — I10 Essential (primary) hypertension: Secondary | ICD-10-CM

## 2016-12-15 DIAGNOSIS — M546 Pain in thoracic spine: Secondary | ICD-10-CM

## 2016-12-15 DIAGNOSIS — Z1239 Encounter for other screening for malignant neoplasm of breast: Secondary | ICD-10-CM

## 2016-12-15 DIAGNOSIS — M545 Low back pain, unspecified: Secondary | ICD-10-CM | POA: Insufficient documentation

## 2016-12-15 DIAGNOSIS — Z720 Tobacco use: Secondary | ICD-10-CM

## 2016-12-15 DIAGNOSIS — Z1231 Encounter for screening mammogram for malignant neoplasm of breast: Secondary | ICD-10-CM

## 2016-12-15 DIAGNOSIS — G8929 Other chronic pain: Secondary | ICD-10-CM | POA: Insufficient documentation

## 2016-12-15 DIAGNOSIS — I700 Atherosclerosis of aorta: Secondary | ICD-10-CM

## 2016-12-15 DIAGNOSIS — M549 Dorsalgia, unspecified: Secondary | ICD-10-CM | POA: Insufficient documentation

## 2016-12-15 MED ORDER — PRAVASTATIN SODIUM 40 MG PO TABS: 40.0000 mg | ORAL_TABLET | Freq: Every day | ORAL | 1 refills | Status: DC

## 2016-12-15 MED ORDER — CYCLOBENZAPRINE HCL 5 MG PO TABS: 5.0000 mg | ORAL_TABLET | Freq: Every day | ORAL | 0 refills | Status: AC

## 2016-12-15 NOTE — Assessment & Plan Note (Signed)
Treat with PT, muscle relaxers; proper posture and lifting; turmeric and tylenol

## 2016-12-15 NOTE — Assessment & Plan Note (Signed)
Fair control, despite her back pain

## 2016-12-15 NOTE — Patient Instructions (Addendum)
Do not get the refill of the Lipitor (atorvastatin) Start the new cholesterol medicine called pravastatin Recheck your cholesterol in 6 weeks (come fasting)  Please do call me when you are ready to quit smoking and I am here to help I do encourage you to quit smoking Call [phone] to sign up for smoking cessation classes You can call 1-800-QUIT-NOW to talk with a smoking cessation coach  Tylenol per package directions Topicals such as Biofreeze are helpful Temperature - low temp heating pads or ice to the affected area Try turmeric as a natural anti-inflammatory (for pain and arthritis). It comes in capsules where you buy aspirin and fish oil, but also as a spice where you buy pepper and garlic powder. Therapy - I've put in a referral to physical therapy for you Tai chi or yoga - stretching and strengthening the back muscles  Try to get 800 iu of vitamin D3 every day  Back Exercises If you have pain in your back, do these exercises 2-3 times each day or as told by your doctor. When the pain goes away, do the exercises once each day, but repeat the steps more times for each exercise (do more repetitions). If you do not have pain in your back, do these exercises once each day or as told by your doctor. Exercises Single Knee to Chest  Do these steps 3-5 times in a row for each leg: 1. Lie on your back on a firm bed or the floor with your legs stretched out. 2. Bring one knee to your chest. 3. Hold your knee to your chest by grabbing your knee or thigh. 4. Pull on your knee until you feel a gentle stretch in your lower back. 5. Keep doing the stretch for 10-30 seconds. 6. Slowly let go of your leg and straighten it.  Pelvic Tilt  Do these steps 5-10 times in a row: 1. Lie on your back on a firm bed or the floor with your legs stretched out. 2. Bend your knees so they point up to the ceiling. Your feet should be flat on the floor. 3. Tighten your lower belly (abdomen) muscles to  press your lower back against the floor. This will make your tailbone point up to the ceiling instead of pointing down to your feet or the floor. 4. Stay in this position for 5-10 seconds while you gently tighten your muscles and breathe evenly.  Cat-Cow  Do these steps until your lower back bends more easily: 1. Get on your hands and knees on a firm surface. Keep your hands under your shoulders, and keep your knees under your hips. You may put padding under your knees. 2. Let your head hang down, and make your tailbone point down to the floor so your lower back is round like the back of a cat. 3. Stay in this position for 5 seconds. 4. Slowly lift your head and make your tailbone point up to the ceiling so your back hangs low (sags) like the back of a cow. 5. Stay in this position for 5 seconds.  Press-Ups  Do these steps 5-10 times in a row: 1. Lie on your belly (face-down) on the floor. 2. Place your hands near your head, about shoulder-width apart. 3. While you keep your back relaxed and keep your hips on the floor, slowly straighten your arms to raise the top half of your body and lift your shoulders. Do not use your back muscles. To make yourself more comfortable, you may  change where you place your hands. 4. Stay in this position for 5 seconds. 5. Slowly return to lying flat on the floor.  Bridges  Do these steps 10 times in a row: 1. Lie on your back on a firm surface. 2. Bend your knees so they point up to the ceiling. Your feet should be flat on the floor. 3. Tighten your butt muscles and lift your butt off of the floor until your waist is almost as high as your knees. If you do not feel the muscles working in your butt and the back of your thighs, slide your feet 1-2 inches farther away from your butt. 4. Stay in this position for 3-5 seconds. 5. Slowly lower your butt to the floor, and let your butt muscles relax.  If this exercise is too easy, try doing it with your arms  crossed over your chest. Belly Crunches  Do these steps 5-10 times in a row: 1. Lie on your back on a firm bed or the floor with your legs stretched out. 2. Bend your knees so they point up to the ceiling. Your feet should be flat on the floor. 3. Cross your arms over your chest. 4. Tip your chin a little bit toward your chest but do not bend your neck. 5. Tighten your belly muscles and slowly raise your chest just enough to lift your shoulder blades a tiny bit off of the floor. 6. Slowly lower your chest and your head to the floor.  Back Lifts Do these steps 5-10 times in a row: 1. Lie on your belly (face-down) with your arms at your sides, and rest your forehead on the floor. 2. Tighten the muscles in your legs and your butt. 3. Slowly lift your chest off of the floor while you keep your hips on the floor. Keep the back of your head in line with the curve in your back. Look at the floor while you do this. 4. Stay in this position for 3-5 seconds. 5. Slowly lower your chest and your face to the floor.  Contact a doctor if:  Your back pain gets a lot worse when you do an exercise.  Your back pain does not lessen 2 hours after you exercise. If you have any of these problems, stop doing the exercises. Do not do them again unless your doctor says it is okay. Get help right away if:  You have sudden, very bad back pain. If this happens, stop doing the exercises. Do not do them again unless your doctor says it is okay. This information is not intended to replace advice given to you by your health care provider. Make sure you discuss any questions you have with your health care provider. Document Released: 04/02/2010 Document Revised: 08/06/2015 Document Reviewed: 04/24/2014 Elsevier Interactive Patient Education  [employer].
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
## 2016-12-15 NOTE — Assessment & Plan Note (Signed)
Not ready to quit; I am here when ready

## 2016-12-15 NOTE — Assessment & Plan Note (Signed)
Refer to PT; good posture, proper lifting, turmeric, topicals, tylenol

## 2016-12-15 NOTE — Assessment & Plan Note (Signed)
Start back on statin 

## 2016-12-19 ENCOUNTER — Ambulatory Visit (INDEPENDENT_AMBULATORY_CARE_PROVIDER_SITE_OTHER): Payer: Self-pay | Admitting: Gastroenterology

## 2016-12-19 ENCOUNTER — Encounter: Payer: Self-pay | Admitting: Gastroenterology

## 2016-12-19 VITALS — BP 109/76 | HR 105 | Temp 98.1°F | Ht 62.0 in | Wt 113.6 lb

## 2016-12-19 DIAGNOSIS — Z1211 Encounter for screening for malignant neoplasm of colon: Secondary | ICD-10-CM

## 2016-12-19 DIAGNOSIS — K219 Gastro-esophageal reflux disease without esophagitis: Secondary | ICD-10-CM

## 2016-12-19 DIAGNOSIS — D649 Anemia, unspecified: Secondary | ICD-10-CM

## 2016-12-19 NOTE — Progress Notes (Signed)
Yvonne Darby, MD [address]  [place]  [place], [place] [address]  Main: [phone]  Fax: [phone]    Gastroenterology Consultation  Referring Provider:     Arnetha Courser, MD Primary Care Physician:  Arnetha Courser, MD Primary Gastroenterologist:  Dr. Cephas Scott Reason for Consultation:     Anemia        HPI:   Yvonne Scott is a 56 y.o. y/o female referred by Dr. Arnetha Courser, MD  for consultation & management of Anemia. She was found to have mild anemia with hemoglobin 11 on 10/15/2016. This was repeated 24 hours later, came back to 11.5. Latest hemoglobin is 12.5 on 11/17/2016. MCV has been normal. Ferritin levels are normal. Vitamin B12 is 300. She has chronic low back pain and has been taking Advil 4 pills daily for the last few weeks. She denies any black stools. She denies abdominal pain, nausea, vomiting. She does report dizziness that has been there for several years, she feels fullness in her ears and think she has fluid in her ears secondary to recent sinusitis. She is using nasal sprays for her sinus problems. She never had a colonoscopy. She denies any family history of colon cancer or other GI malignancies. She denies having any GI surgeries. She does smoke 1 pack per day for the last 20-30 years She has been on long-term PPI for chronic heartburn which is under control on Protonix 40 mg daily. She denies dysphagia, weight loss or loss of appetite  GI Procedures: None  Past Medical History:  Diagnosis Date  . Anxiety   . Aortic atherosclerosis ([facility]) 10/24/2016  . GERD (gastroesophageal reflux disease)     No past surgical history on file.  Prior to Admission medications   Medication Sig Start Date End Date Taking? Authorizing Provider  fluticasone (FLONASE) 50 MCG/ACT nasal spray Place 1 spray into both nostrils daily. 10/18/16  Yes Gladstone Lighter, MD  guaiFENesin (MUCINEX) 600 MG 12 hr tablet Take 2 tablets (1,200 mg total) by mouth  2 (two) times daily. 10/17/16  Yes Gladstone Lighter, MD  lisinopril-hydrochlorothiazide (PRINZIDE,ZESTORETIC) 10-12.5 MG tablet Take 0.5 tablets by mouth daily. 12/05/16  Yes Lada, Satira Anis, MD  loratadine (CLARITIN) 10 MG tablet Take 1 tablet (10 mg total) by mouth daily as needed for allergies. 10/24/16  Yes Lada, Satira Anis, MD  Magnesium 250 MG TABS Take 250 mg by mouth daily.   Yes [provider]  pantoprazole (PROTONIX) 40 MG tablet Take 1 tablet (40 mg total) by mouth 2 (two) times daily. 10/17/16  Yes Gladstone Lighter, MD  Potassium 99 MG TABS Take 99 mg by mouth daily.   Yes [provider]  pravastatin (PRAVACHOL) 40 MG tablet Take 1 tablet (40 mg total) by mouth at bedtime. 12/15/16  Yes Lada, Satira Anis, MD  vitamin B-12 (CYANOCOBALAMIN) 1000 MCG tablet Take 1,000 mcg by mouth daily.   Yes [provider]  cyclobenzaprine (FLEXERIL) 5 MG tablet Take 1 tablet (5 mg total) by mouth at bedtime. If needed; do not drive for 8 hours after taking Patient not taking: Reported on 12/19/2016 12/15/16 12/22/16  Arnetha Courser, MD    Family History  Problem Relation Age of Onset  . Cancer Mother 30       breast  . Head & neck cancer Father   . Cancer Father        melonma  . Cirrhosis Maternal Grandfather   . Cancer  Paternal Grandmother        skin/breast     Social History  Substance Use Topics  . Smoking status: Current Every Day Smoker    Packs/day: 1.00    Years: 36.00    Types: Cigarettes    Start date: 03/15/1979  . Smokeless tobacco: Never Used  . Alcohol use No    Allergies as of 12/19/2016  . (No Known Allergies)    Review of Systems:    All systems reviewed and negative except where noted in HPI.   Physical Exam:  BP 109/76   Pulse (!) 105   Temp 98.1 F (36.7 C) (Oral)   Ht 5\' 2"  (1.575 m)   Wt 113 lb 9.6 oz (51.5 kg)   LMP 11/19/2002 (Approximate)   BMI 20.78 kg/m  Patient's last menstrual period was 11/19/2002  (approximate).  General:   Alert,  Well-developed, well-nourished, pleasant and cooperative in NAD Head:  Normocephalic and atraumatic. Eyes:  Sclera clear, no icterus.   Conjunctiva pink. Ears:  Normal auditory acuity. Nose:  No deformity, discharge, or lesions. Mouth:  No deformity or lesions,oropharynx pink & moist. Neck:  Supple; no masses or thyromegaly. Lungs:  Respirations even and unlabored.  Clear throughout to auscultation.   No wheezes, crackles, or rhonchi. No acute distress. Heart:  Regular rate and rhythm; no murmurs, clicks, rubs, or gallops. Abdomen:  Normal bowel sounds. Soft, non-tender and non-distended without masses, hepatosplenomegaly or hernias noted.  No guarding or rebound tenderness.   Rectal: Nor performed Msk:  Symmetrical without gross deformities. Good, equal movement & strength bilaterally. Pulses:  Normal pulses noted. Extremities:  No clubbing or edema.  No cyanosis. Neurologic:  Alert and oriented x3;  grossly normal neurologically. Skin:  Intact without significant lesions or rashes. No jaundice. Lymph Nodes:  No significant cervical adenopathy. Psych:  Alert and cooperative. Normal mood and affect.  Imaging Studies: Abdominal imaging reviewed from 10/2016  Assessment and Plan:   [name] is a 56 y.o. female with Long-term tobacco use, chronic GERD controlled on PPI daily, transient normocytic anemia. Heavy NSAID use for low back pain. Pancreas is normal on CT A/P with IV contrast on 10/31/2016  Recommend EGD given history of chronic GERD and heavy NSAID use Encouraged her to minimize NSAID intake, alternate with extra strength Tylenol Recommend colonoscopy for colon cancer screening  I have discussed alternative options, risks & benefits,  which include, but are not limited to, bleeding, infection, perforation,respiratory complication & drug reaction.  The patient agrees with this plan & written consent will be obtained.    Follow up based  on the above tests   Yvonne Darby, MD

## 2016-12-20 ENCOUNTER — Other Ambulatory Visit: Payer: Self-pay

## 2016-12-20 DIAGNOSIS — Z1211 Encounter for screening for malignant neoplasm of colon: Secondary | ICD-10-CM

## 2016-12-20 DIAGNOSIS — K219 Gastro-esophageal reflux disease without esophagitis: Secondary | ICD-10-CM

## 2017-01-13 ENCOUNTER — Telehealth: Payer: Self-pay | Admitting: Gastroenterology

## 2017-01-13 NOTE — Telephone Encounter (Signed)
Patient LVM to cancel procedure on Tues. 01/17/17.

## 2017-01-17 ENCOUNTER — Encounter: Payer: Self-pay | Admitting: Anesthesiology

## 2017-01-17 ENCOUNTER — Telehealth: Payer: Self-pay

## 2017-01-17 ENCOUNTER — Encounter: Admission: RE | Payer: Self-pay | Source: Ambulatory Visit

## 2017-01-17 ENCOUNTER — Ambulatory Visit: Admission: RE | Admit: 2017-01-17 | Payer: Self-pay | Source: Ambulatory Visit | Admitting: Gastroenterology

## 2017-01-17 SURGERY — COLONOSCOPY WITH PROPOFOL
Anesthesia: General

## 2017-01-17 NOTE — Telephone Encounter (Signed)
LVM to let pt know received message to cancel colonoscopy with Dr. Marius Ditch today.  Notified Endo.  Thanks [employer]

## 2017-01-27 ENCOUNTER — Encounter: Payer: Self-pay | Admitting: Family Medicine

## 2017-01-27 ENCOUNTER — Ambulatory Visit (INDEPENDENT_AMBULATORY_CARE_PROVIDER_SITE_OTHER): Payer: Self-pay | Admitting: Family Medicine

## 2017-01-27 DIAGNOSIS — E876 Hypokalemia: Secondary | ICD-10-CM | POA: Insufficient documentation

## 2017-01-27 DIAGNOSIS — Z720 Tobacco use: Secondary | ICD-10-CM

## 2017-01-27 DIAGNOSIS — I10 Essential (primary) hypertension: Secondary | ICD-10-CM

## 2017-01-27 DIAGNOSIS — R7989 Other specified abnormal findings of blood chemistry: Secondary | ICD-10-CM

## 2017-01-27 DIAGNOSIS — R739 Hyperglycemia, unspecified: Secondary | ICD-10-CM | POA: Insufficient documentation

## 2017-01-27 DIAGNOSIS — I700 Atherosclerosis of aorta: Secondary | ICD-10-CM

## 2017-01-27 NOTE — Assessment & Plan Note (Signed)
Check lipids, goal LDL is less than 70; limit saturated fats; discussed atherosclerosis and role of smoking, lipids

## 2017-01-27 NOTE — Assessment & Plan Note (Signed)
Much improved between last two draws; since this was resolving and she does not have health insurance, will not order this $ test today

## 2017-01-27 NOTE — Assessment & Plan Note (Signed)
Check A1c. 

## 2017-01-27 NOTE — Assessment & Plan Note (Signed)
Patient is not quite ready to quit; I am here for her if/when I am needed to help her quit

## 2017-01-27 NOTE — Assessment & Plan Note (Signed)
Check K+ 

## 2017-01-27 NOTE — Progress Notes (Signed)
BP 112/64   Pulse 87   Temp 98.1 F (36.7 C) (Oral)   Resp 16   Wt 114 lb 9.6 oz (52 kg)   LMP 11/19/2002 (Approximate)   SpO2 95%   BMI 20.96 kg/m    Subjective:    Patient ID: Yvonne Scott, female    DOB: 08-04-1960, 56 y.o.   MRN: [id number]  HPI: Yvonne Scott is a 56 y.o. female  Chief Complaint  Patient presents with  . Follow-up    HPI She says that her insurance did not go through; she does not have health insurance  She has high blood pressure; controlled today  She came fasting for labs; high cholesterol; on statin; lots of fried foods, but has not done as much  She has not had the actual colonoscopy because of lack of insurance; saw the specialist and she told her she has a lipoma on her stomach  Borderline diabetes; low insulin level; dry mouth; blurred vision  She has had vertigo for years; feels like the fluid in her ears  Bacteria on the tongue; no burning, just yuck; brushes it all the time; seeing dentist soon because of lost filling; came out  Reviewed labs; mildly low Na+ and Cl-; always drinks diet Pepsi, always has one in her hand; K+ back to normal  Not having spontaneous ecchymoses any more  She is still smoking, 1 ppd; not drinking  Depression screen [facility] 2/9 01/27/2017 12/15/2016 11/17/2016 11/07/2016 10/24/2016  Decreased Interest 0 0 0 0 0  Down, Depressed, Hopeless 0 0 0 0 1  PHQ - 2 Score 0 0 0 0 1  Altered sleeping - - 1 - -  Tired, decreased energy - - 0 - -  Change in appetite - - 0 - -  Feeling bad or failure about yourself  - - 0 - -  Trouble concentrating - - 0 - -  Moving slowly or fidgety/restless - - 0 - -  Suicidal thoughts - - 0 - -  PHQ-9 Score - - 1 - -  Difficult doing work/chores - - Not difficult at all - -    Relevant past medical, surgical, family and social history reviewed Past Medical History:  Diagnosis Date  . Anxiety   . Aortic atherosclerosis ([facility]) 10/24/2016  . GERD (gastroesophageal reflux  disease)    History reviewed. No pertinent surgical history. Family History  Problem Relation Age of Onset  . Cancer Mother 83       breast  . Head & neck cancer Father   . Cancer Father        melonma  . Cirrhosis Maternal Grandfather   . Cancer Paternal Grandmother        skin/breast   Social History   Socioeconomic History  . Marital status: Widowed    Spouse name: Not on file  . Number of children: Not on file  . Years of education: Not on file  . Highest education level: Not on file  Social Needs  . Financial resource strain: Not on file  . Food insecurity - worry: Not on file  . Food insecurity - inability: Not on file  . Transportation needs - medical: Not on file  . Transportation needs - non-medical: Not on file  Occupational History  . Not on file  Tobacco Use  . Smoking status: Current Every Day Smoker    Packs/day: 1.00    Years: 36.00    Pack years: 36.00  Types: Cigarettes    Start date: 03/15/1979  . Smokeless tobacco: Never Used  Substance and Sexual Activity  . Alcohol use: No    Alcohol/week: 1.8 oz    Types: 3 Standard drinks or equivalent per week  . Drug use: No  . Sexual activity: Not Currently  Other Topics Concern  . Not on file  Social History Narrative  . Not on file    Interim medical history since last visit reviewed. Allergies and medications reviewed  Review of Systems Per HPI unless specifically indicated above     Objective:    BP 112/64   Pulse 87   Temp 98.1 F (36.7 C) (Oral)   Resp 16   Wt 114 lb 9.6 oz (52 kg)   LMP 11/19/2002 (Approximate)   SpO2 95%   BMI 20.96 kg/m   Wt Readings from Last 3 Encounters:  01/27/17 114 lb 9.6 oz (52 kg)  12/19/16 113 lb 9.6 oz (51.5 kg)  12/15/16 112 lb 8 oz (51 kg)    Physical Exam  Constitutional: She appears well-developed and well-nourished. No distress.  HENT:  Head: Normocephalic and atraumatic.  Eyes: EOM are normal. No scleral icterus.  Neck: No thyromegaly  present.  Cardiovascular: Normal rate and regular rhythm.  Pulmonary/Chest: Effort normal and breath sounds normal. No respiratory distress. She has no wheezes.  Abdominal: Soft. Bowel sounds are normal. She exhibits no distension.  Musculoskeletal: She exhibits no edema.  Neurological: She is alert. She exhibits normal muscle tone.  Skin: She is not diaphoretic. No pallor.  Psychiatric: She has a normal mood and affect. Her behavior is normal.    Results for orders placed or performed in visit on 11/17/16  Ferritin  Result Value Ref Range   Ferritin 282 (H) 10 - 232 ng/mL  CBC  Result Value Ref Range   WBC 7.5 3.8 - 10.8 Thousand/uL   RBC 3.89 3.80 - 5.10 Million/uL   Hemoglobin 12.5 11.7 - 15.5 g/dL   HCT 36.4 35.0 - 45.0 %   MCV 93.6 80.0 - 100.0 fL   MCH 32.1 27.0 - 33.0 pg   MCHC 34.3 32.0 - 36.0 g/dL   RDW 12.1 11.0 - 15.0 %   Platelets 284 140 - 400 Thousand/uL   MPV 11.1 7.5 - 12.5 fL  Magnesium  Result Value Ref Range   Magnesium 1.5 1.5 - 2.5 mg/dL  BASIC METABOLIC PANEL WITH GFR  Result Value Ref Range   Glucose, Bld 74 65 - 139 mg/dL   BUN 5 (L) 7 - 25 mg/dL   Creat 0.74 0.50 - 1.05 mg/dL   GFR, Est Non African American 91 > OR = 60 mL/min/1.95m2   GFR, Est African American 105 > OR = 60 mL/min/1.62m2   BUN/Creatinine Ratio 7 6 - 22 (calc)   Sodium 134 (L) 135 - 146 mmol/L   Potassium 3.9 3.5 - 5.3 mmol/L   Chloride 95 (L) 98 - 110 mmol/L   CO2 30 20 - 32 mmol/L   Calcium 9.4 8.6 - 10.4 mg/dL      Assessment & Plan:   Problem List Items Addressed This Visit      Cardiovascular and Mediastinum   Essential hypertension, benign    Well-controlled; continue current plan; stay away from excess salt      Aortic atherosclerosis ([facility])    Check lipids, goal LDL is less than 70; limit saturated fats; discussed atherosclerosis and role of smoking, lipids      Relevant  Orders   Lipid panel     Other   Tobacco abuse    Patient is not quite ready to quit; I  am here for her if/when I am needed to help her quit      Hypokalemia    Check K+      Relevant Orders   Basic metabolic panel   Hyperglycemia    Check A1c      Relevant Orders   Hemoglobin A1c   Elevated ferritin    Much improved between last two draws; since this was resolving and she does not have health insurance, will not order this $ test today          Follow up plan: Return in about 6 months (around 07/27/2017).  An after-visit summary was printed and given to the patient at [facility].  Please see the patient instructions which may contain other information and recommendations beyond what is mentioned above in the assessment and plan.  No orders of the defined types were placed in this encounter.   Orders Placed This Encounter  Procedures  . Hemoglobin A1c  . Basic metabolic panel  . Lipid panel

## 2017-01-27 NOTE — Assessment & Plan Note (Signed)
Well-controlled; continue current plan; stay away from excess salt

## 2017-01-27 NOTE — Patient Instructions (Addendum)
Good oral hygiene Always dilute the hydrogen peroxide before you rinse Do please see your dentist Try to limit saturated fats in your diet (bologna, hot dogs, barbeque, cheeseburgers, hamburgers, steak, bacon, sausage, cheese, etc.) and get more fresh fruits, vegetables, and whole grains I do encourage you to quit smoking Call [phone] to sign up for smoking cessation classes You can call 1-800-QUIT-NOW to talk with a smoking cessation coach Consider applying to be a patient at the [facility] [address] [place], [place], [place] [address] [phone]   Tobacco Use Disorder Tobacco use disorder (TUD) is a mental disorder. It is the long-term use of tobacco in spite of related health problems or difficulty with normal life activities. Tobacco is most commonly smoked as cigarettes and less commonly as cigars or pipes. Smokeless chewing tobacco and snuff are also popular. People with TUD get a feeling of extreme pleasure (euphoria) from using tobacco and have a desire to use it again and again. Repeated use of tobacco can cause problems. The addictive effects of tobacco are due mainly tothe ingredient nicotine. Nicotine also causes a rush of adrenaline (epinephrine) in the body. This leads to increased blood pressure, heart rate, and breathing rate. These changes may cause problems for people with high blood pressure, weak hearts, or lung disease. High doses of nicotine in children and pets can lead to seizures and death. Tobacco contains a number of other unsafe chemicals. These chemicals are especially harmful when inhaled as smoke and can damage almost every organ in the body. Smokers live shorter lives than nonsmokers and are at risk of dying from a number of diseases and cancers. Tobacco smoke can also cause health problems for nonsmokers (due to inhaling secondhand smoke). Smoking is also a fire hazard. TUD  usually starts in the late teenage years and is most common in young adults between the ages of 77 and 32 years. People who start smoking earlier in life are more likely to continue smoking as adults. TUD is somewhat more common in men than women. People with TUD are at higher risk for using alcohol and other drugs of abuse. What increases the risk? Risk factors for TUD include:  Having family members with the disorder.  Being around people who use tobacco.  Having an existing mental health issue such as schizophrenia, depression, bipolar disorder, ADHD, or posttraumatic stress disorder (PTSD).  What are the signs or symptoms? People with tobacco use disorder have two or more of the following signs and symptoms within 12 months:  Use of more tobacco over a longer period than intended.  Not able to cut down or control tobacco use.  A lot of time spent obtaining or using tobacco.  Strong desire or urge to use tobacco (craving). Cravings may last for 6 months or longer after quitting.  Use of tobacco even when use leads to major problems at work, school, or home.  Use of tobacco even when use leads to relationship problems.  Giving up or cutting down on important life activities because of tobacco use.  Repeatedly using tobacco in situations where it puts you or others in physical danger, like smoking in bed.  Use of tobacco even when it is known that a physical or mental problem is likely related to tobacco use. ? Physical problems are numerous and may include chronic bronchitis, emphysema, lung and other cancers, gum disease, high blood pressure, heart disease, and  stroke. ? Mental problems caused by tobacco may include difficulty sleeping and anxiety.  Need to use greater amounts of tobacco to get the same effect. This means you have developed a tolerance.  Withdrawal symptoms as a result of stopping or rapidly cutting back use. These symptoms may last a month or more after  quitting and include the following: ? Depressed, anxious, or irritable mood. ? Difficulty concentrating. ? Increased appetite. ? Restlessness or trouble sleeping. ? Use of tobacco to avoid withdrawal symptoms.  How is this diagnosed? Tobacco use disorder is diagnosed by your health care provider. A diagnosis may be made by:  Your health care provider asking questions about your tobacco use and any problems it may be causing.  A physical exam.  Lab tests.  You may be referred to a mental health professional or addiction specialist.  The severity of tobacco use disorder depends on the number of signs and symptoms you have:  Mild-Two or three symptoms.  Moderate-Four or five symptoms.  Severe-Six or more symptoms.  How is this treated? Many people with tobacco use disorder are unable to quit on their own and need help. Treatment options include the following:  Nicotine replacement therapy (NRT). NRT provides nicotine without the other harmful chemicals in tobacco. NRT gradually lowers the dosage of nicotine in the body and reduces withdrawal symptoms. NRT is available in over-the-counter forms (gum, lozenges, and skin patches) as well as prescription forms (mouth inhaler and nasal spray).  Medicines.This may include: ? Antidepressant medicine that may reduce nicotine cravings. ? A medicine that acts on nicotine receptors in the brain to reduce cravings and withdrawal symptoms. It may also block the effects of tobacco in people with TUD who relapse.  Counseling or talk therapy. A form of talk therapy called behavioral therapy is commonly used to treat people with TUD. Behavioral therapy looks at triggers for tobacco use, how to avoid them, and how to cope with cravings. It is most effective in person or by phone but is also available in self-help forms (books and Internet websites).  Support groups. These provide emotional support, advice, and guidance for quitting tobacco.  The  most effective treatment for TUD is usually a combination of medicine, talk therapy, and support groups. Follow these instructions at home:  Keep all follow-up visits as directed by your health care provider. This is important.  Take medicines only as directed by your health care provider.  Check with your health care provider before starting new prescription or over-the-counter medicines. Contact a health care provider if:  You are not able to take your medicines as prescribed.  Treatment is not helping your TUD and your symptoms get worse. Get help right away if:  You have serious thoughts about hurting yourself or others.  You have trouble breathing, chest pain, sudden weakness, or sudden numbness in part of your body. This information is not intended to replace advice given to you by your health care provider. Make sure you discuss any questions you have with your health care provider. Document Released: 11/04/2003 Document Revised: 11/01/2015 Document Reviewed: 04/26/2013 Elsevier Interactive Patient Education  [employer].

## 2017-01-28 LAB — LIPID PANEL
CHOL/HDL RATIO: 2.8 (calc) (ref <5.0)
Cholesterol: 161 mg/dL (ref <200)
HDL: 58 mg/dL (ref >50)
LDL CHOLESTEROL (CALC): 80 mg/dL
NON-HDL CHOLESTEROL (CALC): 103 mg/dL (ref <130)
TRIGLYCERIDES: 124 mg/dL (ref <150)

## 2017-01-28 LAB — BASIC METABOLIC PANEL
BUN / CREAT RATIO: 6 (calc) (ref 6–22)
BUN: 5 mg/dL — AB (ref 7–25)
CO2: 29 mmol/L (ref 20–32)
CREATININE: 0.77 mg/dL (ref 0.50–1.05)
Calcium: 8.9 mg/dL (ref 8.6–10.4)
Chloride: 94 mmol/L — ABNORMAL LOW (ref 98–110)
GLUCOSE: 87 mg/dL (ref 65–139)
Potassium: 3.4 mmol/L — ABNORMAL LOW (ref 3.5–5.3)
SODIUM: 132 mmol/L — AB (ref 135–146)

## 2017-01-28 LAB — HEMOGLOBIN A1C
HEMOGLOBIN A1C: 5.1 %{Hb} (ref <5.7)
MEAN PLASMA GLUCOSE: 100 (calc)
eAG (mmol/L): 5.5 (calc)

## 2017-01-30 ENCOUNTER — Other Ambulatory Visit: Payer: Self-pay | Admitting: Family Medicine

## 2017-01-30 ENCOUNTER — Telehealth: Payer: Self-pay

## 2017-01-30 DIAGNOSIS — E878 Other disorders of electrolyte and fluid balance, not elsewhere classified: Secondary | ICD-10-CM | POA: Insufficient documentation

## 2017-01-30 DIAGNOSIS — E871 Hypo-osmolality and hyponatremia: Secondary | ICD-10-CM | POA: Insufficient documentation

## 2017-01-30 NOTE — Telephone Encounter (Signed)
Copied from [place] #[id number]. Topic: General - Other >> Jan 30, 2017  4:22 PM Yvonne Scott, NT wrote: Reason for CRM: pt states she has done everything lada has told her to do, she was told to stop eating salt because sodium was high and now it is low, her chest is hurting, she said she was told not to take BP medicine and ever since her chest is hurting.   she wants Dr. Sanda Klein to give her a call.   Called pt back again to inform her about the instructions I had left her earlier today in the lab result section for what to do about her potassium and sodium levels.  I also instructed her to go to ER for her just pain and follow Dr. Jarold Motto recommendations about her labs unless otherwise directed by ER.

## 2017-01-30 NOTE — Telephone Encounter (Signed)
Copied from [place] [id number]. Topic: General - Other >> Jan 30, 2017  4:16 PM Conception Chancy, NT wrote: Reason for CRM: pt states Dr. Sanda Klein told her to quit eating salt which she has done, now they are telling her her sodium is low, and her potassium is low now and it was once high. She said she needs clarification on what to do. Pt states she does not feel right and has no energy. States shes done everything Dr. Sanda Klein has told her to do. Pt states chest is hurting also.   Transferring her to nurse triage.. But would like Dr. Sanda Klein to give her a call.

## 2017-01-30 NOTE — Progress Notes (Signed)
Stop BP med; increase oral intake of K+; return in 12-14 days with CMA for BP and BMP (CMA to order), dx hyponatremia, hypokalemia, hypochloremia

## 2017-01-30 NOTE — Telephone Encounter (Signed)
Copied from [place] [id number]. Topic: General - Other >> Jan 30, 2017  4:16 PM Conception Chancy, NT wrote: Reason for CRM: pt states Dr. Sanda Klein told her to quit eating salt which she has done, now they are telling her her sodium is low, and her potassium is low now and it was once high. She said she needs clarification on what to do. Pt states she does not feel right and has no energy. States shes done everything Dr. Sanda Klein has told her to do. Pt states chest is hurting also.   Transferring her to nurse triage.. But would like Dr. Sanda Klein to give her a call.

## 2017-03-31 ENCOUNTER — Telehealth: Payer: Self-pay | Admitting: Family Medicine

## 2017-03-31 MED ORDER — PRAVASTATIN SODIUM 40 MG PO TABS: 40.0000 mg | ORAL_TABLET | Freq: Every day | ORAL | 1 refills | Status: DC

## 2017-03-31 NOTE — Telephone Encounter (Signed)
I refilled her cholesterol medicine Yes, I would suggest she continue to take the cholesterol medicine (pravastatin) Please refer the rest of the note to the office manager

## 2017-03-31 NOTE — Telephone Encounter (Signed)
Copied from [facility] [phone]. Topic: Quick Communication - See Telephone Encounter >> Mar 31, 2017  4:21 PM Bea Graff, NT wrote: CRM for notification. See Telephone encounter for: Pt would like to see if she needs to still take her pravastatin (PRAVACHOL). She also wants to talk about that while in the hospital back in August the blood pressure cuff popped on her arm and she states it still looks really bad and she states "toot, toot" thing that was in her vaginal area that turns (she states she is not talking about a catheter) bruised her very badly and she wants to see if she needs to get a Chief Executive Officer. Please advise.   03/31/17.

## 2017-03-31 NOTE — Telephone Encounter (Signed)
Please advise. Thanks.  

## 2017-04-03 NOTE — Telephone Encounter (Signed)
LM on 04/03/17 @ 1:29pm giving patient the number to the Office of Patient Experience for [facility] due to the issue she had during her visit at the ER back in August.

## 2017-04-03 NOTE — Telephone Encounter (Signed)
Called pt no answer. LM for pt informing her of the need to continue taking pravastatin, advised pt to call back for questions or concerns. CRM created. Routed to Environmental education officer per provider.

## 2017-04-25 ENCOUNTER — Ambulatory Visit: Payer: Self-pay

## 2017-04-25 NOTE — Telephone Encounter (Signed)
Patient called [phone], left VM to call back to discuss her symptoms of burning in the chest. Patient hung up before call could be transferred to NT.

## 2017-06-02 ENCOUNTER — Ambulatory Visit: Payer: Self-pay

## 2017-06-02 NOTE — Telephone Encounter (Signed)
For information only.

## 2017-06-02 NOTE — Telephone Encounter (Signed)
Pt called to report multiple complaints. Pt c/o pain under left breast that occurs with coughing since August 2018. States that PCP advised her to take Mucinex. Pt states she is having pain under her left breast when she coughs. She said that she puts her hand under her breast when she coughs and that makes it feel better. Pt has a productive cough of clear to white foamy phlegm. Pt stated that she is a smoker. Other complaints were: Pt stated that she was advised to stop taking BP meds but she said that she does take them if she feels like her heart is pounding or under stress. She stated the Pravastatin is making her feel "crazy and mean". She said she takes it at bedtime and it "makes me feel like crap and irritable" She said that the pantoprazole makes her feel dizzy when she takes it and that her left arm was injured "by a blood pressure machine that broke her arm in two and left arm is limp and tingling." Pt wanting to see PCP. Pt given appt for 06/07/17 @ 1140 with Dr Sanda Klein Reason for Disposition . Cough has been present for > 3 weeks . Chest pain(s) lasting a few seconds from coughing AND [2] persists > 3 days  Additional Information . Commented on: All Negative - See Physician Within 4 Hours (or PCP Triage)    Pt stated she has occasional wheezing but not right now  Answer Assessment - Initial Assessment Questions 1. LOCATION: "Where does it hurt?"       Under left breast  2. RADIATION: "Does the pain go anywhere else?" (e.g., into neck, jaw, arms, back)    To back in the last couple of days (pt said that it was when you were taking cholesterol med) 3. ONSET: "When did the chest pain begin?" (Minutes, hours or days)      August 2018 4. PATTERN "Does the pain come and go, or has it been constant since it started?"  "Does it get worse with exertion?"     Comes and goes worse when pt coughs 5. DURATION: "How long does it last" (e.g., seconds, minutes, hours)     Few minutes 6. SEVERITY: "How  bad is the pain?"  (e.g., Scale 1-10; mild, moderate, or severe)    - MILD (1-3): doesn't interfere with normal activities     - MODERATE (4-7): interferes with normal activities or awakens from sleep    - SEVERE (8-10): excruciating pain, unable to do any normal activities       mild 7. CARDIAC RISK FACTORS: "Do you have any history of heart problems or risk factors for heart disease?" (e.g., prior heart attack, angina; high blood pressure, diabetes, being overweight, high cholesterol, smoking, or strong family history of heart disease)     High cholesterol and smoker 8. PULMONARY RISK FACTORS: "Do you have any history of lung disease?"  (e.g., blood clots in lung, asthma, emphysema, birth control pills)     no 9. CAUSE: "What do you think is causing the chest pain?"     Happens when she coughs 10. OTHER SYMPTOMS: "Do you have any other symptoms?" (e.g., dizziness, nausea, vomiting, sweating, fever, difficulty breathing, cough)       cough 11. PREGNANCY: "Is there any chance you are pregnant?" "When was your last menstrual period?"       n/a  Answer Assessment - Initial Assessment Questions 1. ONSET: "When did the cough begin?"      All  my life 2. SEVERITY: "How bad is the cough today?"      Since the Mucinex bringing up more phlegm 3. RESPIRATORY DISTRESS: "Describe your breathing."      After exertion: out of breath and chest starts hurting 4. FEVER: "Do you have a fever?" If so, ask: "What is your temperature, how was it measured, and when did it start?"     no 5. SPUTUM: "Describe the color of your sputum" (clear, white, yellow, green)     Foamy clear to white 6. HEMOPTYSIS: "Are you coughing up any blood?" If so ask: "How much?" (flecks, streaks, tablespoons, etc.)     no 7. CARDIAC HISTORY: "Do you have any history of heart disease?" (e.g., heart attack, congestive heart failure)      High cholesterol 8. LUNG HISTORY: "Do you have any history of lung disease?"  (e.g., pulmonary  embolus, asthma, emphysema)     no 9. PE RISK FACTORS: "Do you have a history of blood clots?" (or: recent major surgery, recent prolonged travel, bedridden )     no 10. OTHER SYMPTOMS: "Do you have any other symptoms?" (e.g., runny nose, wheezing, chest pain)       Chest under left breast, no recent wheezing 11. PREGNANCY: "Is there any chance you are pregnant?" "When was your last menstrual period?"       n/a 12. TRAVEL: "Have you traveled out of the country in the last month?" (e.g., travel history, exposures)       no  Protocols used: COUGH - ACUTE PRODUCTIVE-A-AH, CHEST PAIN-A-AH

## 2017-06-07 ENCOUNTER — Ambulatory Visit: Payer: Self-pay | Admitting: Family Medicine

## 2017-07-27 ENCOUNTER — Ambulatory Visit: Payer: Self-pay | Admitting: Family Medicine

## 2018-04-24 ENCOUNTER — Other Ambulatory Visit: Payer: Self-pay

## 2018-04-24 ENCOUNTER — Encounter: Payer: Self-pay | Admitting: Nurse Practitioner

## 2018-04-24 ENCOUNTER — Ambulatory Visit: Payer: Self-pay | Admitting: Nurse Practitioner

## 2018-04-24 VITALS — BP 120/80 | HR 95 | Temp 97.7°F | Resp 18 | Ht 63.0 in | Wt 112.3 lb

## 2018-04-24 DIAGNOSIS — J0100 Acute maxillary sinusitis, unspecified: Secondary | ICD-10-CM

## 2018-04-24 DIAGNOSIS — R05 Cough: Secondary | ICD-10-CM

## 2018-04-24 DIAGNOSIS — R059 Cough, unspecified: Secondary | ICD-10-CM

## 2018-04-24 DIAGNOSIS — Z122 Encounter for screening for malignant neoplasm of respiratory organs: Secondary | ICD-10-CM

## 2018-04-24 DIAGNOSIS — Z716 Tobacco abuse counseling: Secondary | ICD-10-CM

## 2018-04-24 DIAGNOSIS — R6883 Chills (without fever): Secondary | ICD-10-CM

## 2018-04-24 DIAGNOSIS — R0689 Other abnormalities of breathing: Secondary | ICD-10-CM

## 2018-04-24 DIAGNOSIS — Z599 Problem related to housing and economic circumstances, unspecified: Secondary | ICD-10-CM

## 2018-04-24 DIAGNOSIS — Z598 Other problems related to housing and economic circumstances: Secondary | ICD-10-CM

## 2018-04-24 LAB — POCT INFLUENZA A/B
Influenza A, POC: NEGATIVE
Influenza B, POC: NEGATIVE

## 2018-04-24 MED ORDER — ALBUTEROL SULFATE HFA 108 (90 BASE) MCG/ACT IN AERS: 2.0000 | INHALATION_SPRAY | Freq: Four times a day (QID) | RESPIRATORY_TRACT | 0 refills | Status: DC | PRN

## 2018-04-24 MED ORDER — AMOXICILLIN-POT CLAVULANATE 500-125 MG PO TABS: 1.0000 | ORAL_TABLET | Freq: Three times a day (TID) | ORAL | 0 refills | Status: DC

## 2018-04-24 MED ORDER — ALBUTEROL SULFATE (2.5 MG/3ML) 0.083% IN NEBU: 2.5000 mg | INHALATION_SOLUTION | Freq: Once | RESPIRATORY_TRACT | Status: DC

## 2018-04-24 NOTE — Patient Instructions (Addendum)
Please take your antibiotic as prescribed with food on your stomach to prevent nausea and upset stomach. Take the antibiotic for the entire course, even if your symptoms resolve before the course if completed. Using antibiotics inappropriately can make it harder to treat future infections. If you have any concerning side effects please stop the medication and let us know immediately. I do recommend taking a probiotic to replenish your good gut health anytime you take an antibiotic. You can get probiotics in types of yogurt like activia, or other food/drinks such as kimchi, kombucha , sauerkraut, or you can take an over the counter supplement.   It is important that you drink plenty of fluids, rest. Cover your nose/mouth when you cough or sneeze and wash your hands well and often. Here are some helpful things you can use or pick up over the counter from the pharmacy to help with your symptoms:   For Fever/Pain: Acetaminophen every 6 hours as needed (maximum of 3000mg  a day). If you are still uncomfortable you can add ibuprofen OR naproxen  For coughing: try dextromethorphan for a cough suppressant, and/or a cool mist humidifier, lozenges  For sore throat: saline gargles, honey herbal tea, lozenges, throat spray  To dry out your nose: try an antihistamine like loratadine (non-sedating) or diphenhydramine (sedating) or others To relieve a stuffy nose: try flonase, neti pot To make blowing your nose easier and relieve chest congestion: guaifenesin 400mg  every 4-6 hours of guaifenesin ER [phone] mg every 12 hours. Do not take more than 2,400mg  a day.    If you have financial difficulties you may want to consider using the [facility]. If you qualify to go here all treatment and medications are covered. You can call them at [phone]. You can find them at [address], [place], [place] [address].  Coping with Quitting Smoking  Quitting smoking is a physical and mental  challenge. You will face cravings, withdrawal symptoms, and temptation. Before quitting, work with your health care provider to make a plan that can help you cope. Preparation can help you quit and keep you from giving in. How can I cope with cravings? Cravings usually last for 5-10 minutes. If you get through it, the craving will pass. Consider taking the following actions to help you cope with cravings:  Keep your mouth busy: ? Chew sugar-free gum. ? Suck on hard candies or a straw. ? Brush your teeth.  Keep your hands and body busy: ? Immediately change to a different activity when you feel a craving. ? Squeeze or play with a ball. ? Do an activity or a hobby, like making bead jewelry, practicing needlepoint, or working with wood. ? Mix up your normal routine. ? Take a short exercise break. Go for a quick walk or run up and down stairs. ? Spend time in public places where smoking is not allowed.  Focus on doing something kind or helpful for someone else.  Call a friend or family member to talk during a craving.  Join a support group.  Call a quit line, such as 1-800-QUIT-NOW.  Talk with your health care provider about medicines that might help you cope with cravings and make quitting easier for you. How can I deal with withdrawal symptoms? Your body may experience negative effects as it tries to get used to not having nicotine in the system. These effects are called withdrawal symptoms. They may include:  Feeling hungrier than normal.  Trouble  concentrating.  Irritability.  Trouble sleeping.  Feeling depressed.  Restlessness and agitation.  Craving a cigarette. To manage withdrawal symptoms:  Avoid places, people, and activities that trigger your cravings.  Remember why you want to quit.  Get plenty of sleep.  Avoid coffee and other caffeinated drinks. These may worsen some of your symptoms. How can I handle social situations? Social situations can be difficult  when you are quitting smoking, especially in the first few weeks. To manage this, you can:  Avoid parties, bars, and other social situations where people might be smoking.  Avoid alcohol.  Leave right away if you have the urge to smoke.  Explain to your family and friends that you are quitting smoking. Ask for understanding and support.  Plan activities with friends or family where smoking is not an option. What are some ways I can cope with stress? Wanting to smoke may cause stress, and stress can make you want to smoke. Find ways to manage your stress. Relaxation techniques can help. For example:  Breathe slowly and deeply, in through your nose and out through your mouth.  Listen to soothing, relaxing music.  Talk with a family member or friend about your stress.  Light a candle.  Soak in a bath or take a shower.  Think about a peaceful place. What are some ways I can prevent weight gain? Be aware that many people gain weight after they quit smoking. However, not everyone does. To keep from gaining weight, have a plan in place before you quit and stick to the plan after you quit. Your plan should include:  Having healthy snacks. When you have a craving, it may help to: ? Eat plain popcorn, crunchy carrots, celery, or other cut vegetables. ? Chew sugar-free gum.  Changing how you eat: ? Eat small portion sizes at meals. ? Eat 4-6 small meals throughout the day instead of 1-2 large meals a day. ? Be mindful when you eat. Do not watch television or do other things that might distract you as you eat.  Exercising regularly: ? Make time to exercise each day. If you do not have time for a long workout, do short bouts of exercise for 5-10 minutes several times a day. ? Do some form of strengthening exercise, like weight lifting, and some form of aerobic exercise, like running or swimming.  Drinking plenty of water or other low-calorie or no-calorie drinks. Drink 6-8 glasses of  water daily, or as much as instructed by your health care provider. Summary  Quitting smoking is a physical and mental challenge. You will face cravings, withdrawal symptoms, and temptation to smoke again. Preparation can help you as you go through these challenges.  You can cope with cravings by keeping your mouth busy (such as by chewing gum), keeping your body and hands busy, and making calls to family, friends, or a helpline for people who want to quit smoking.  You can cope with withdrawal symptoms by avoiding places where people smoke, avoiding drinks with caffeine, and getting plenty of rest.  Ask your health care provider about the different ways to prevent weight gain, avoid stress, and handle social situations. This information is not intended to replace advice given to you by your health care provider. Make sure you discuss any questions you have with your health care provider. Document Released: 02/26/2016 Document Revised: 02/26/2016 Document Reviewed: 02/26/2016 Elsevier Interactive Patient Education  2019 [employer].

## 2018-04-24 NOTE — Progress Notes (Addendum)
Name: Yvonne Scott MRN: [id number] DOB: 29-Sep-1960 Date:04/24/2018 Progress Note Subjective Chief Complaint Chief Complaint  Patient presents with  . Cough    productive - thick & white  . Headache  . Ear Pain    bilateral  . Fever    last friday  . Facial Pain    moreso lots of pressure near the sinus area  . Generalized Body Aches    patient stated that she hurts all over  . Fatigue    very tired   HPI Patient presents to the clinic with complaint of sneezing, rhinorrhea, cough, and fever since Friday, January 31st. Thinks she may have the flu.Was In contact with a patient diagnosed with the flu who is currently hospitalized. Pt endorses chest pain when coughing and denies shortness of breath. Has taken ibuprofen for fever with little improvement.States symptoms have continue to worsen and "feels bad all over". Denies nausea and vomiting. Endorses sinus facial pain, headache, fatigue and wheezing. Patient is current smoker, 1 pack per day and attempting to quit. Patient has multiple stressors in her life with recent death in her family. Denies SI  Depression screen [facility] 2/9 04/24/2018 01/27/2017 12/15/2016 11/17/2016 11/07/2016  Decreased Interest 0 0 0 0 0  Down, Depressed, Hopeless 2 0 0 0 0  PHQ - 2 Score 2 0 0 0 0  Altered sleeping 0 - - 1 -  Tired, decreased energy 0 - - 0 -  Change in appetite 0 - - 0 -  Feeling bad or failure about yourself  0 - - 0 -  Trouble concentrating 0 - - 0 -  Moving slowly or fidgety/restless 0 - - 0 -  Suicidal thoughts 0 - - 0 -  PHQ-9 Score 2 - - 1 -  Difficult doing work/chores Not difficult at all - - Not difficult at all -    Patient Active Problem List   Diagnosis Date Noted  . Hyponatremia 01/30/2017  . Hypochloremia 01/30/2017  . Hypokalemia 01/27/2017  . Hyperglycemia 01/27/2017  . Back pain 12/15/2016  . Chronic lower back pain 12/15/2016  . Elevated ferritin 11/07/2016  . Aortic atherosclerosis ([place]) 10/24/2016  . Fatty liver  10/24/2016  . Medication monitoring encounter 11/19/2015  . GERD (gastroesophageal reflux disease) 11/19/2015  . Tobacco abuse 11/19/2015  . Essential hypertension, benign 11/19/2015   Past Medical History:  Diagnosis Date  . Anxiety   . Aortic atherosclerosis ([place]) 10/24/2016  . GERD (gastroesophageal reflux disease)    History reviewed. No pertinent surgical history. Social History   Tobacco Use  . Smoking status: Current Every Day Smoker    Packs/day: 1.00    Years: 36.00    Pack years: 36.00    Types: Cigarettes    Start date: 03/15/1979  . Smokeless tobacco: Never Used  Substance Use Topics  . Alcohol use: No    Alcohol/week: 3.0 standard drinks    Types: 3 Standard drinks or equivalent per week    Current Outpatient Medications:  .  fluticasone (FLONASE) 50 MCG/ACT nasal spray, Place 1 spray into both nostrils daily., Disp: 16 g, Rfl: 1 .  guaiFENesin (MUCINEX) 600 MG 12 hr tablet, Take 2 tablets (1,200 mg total) by mouth 2 (two) times daily., Disp: 60 tablet, Rfl: 0 .  albuterol (PROVENTIL HFA;VENTOLIN HFA) 108 (90 Base) MCG/ACT inhaler, Inhale 2 puffs into the lungs every 6 (six) hours as needed for wheezing or shortness of breath., Disp: 1 Inhaler, Rfl: 0 .  amoxicillin-clavulanate (AUGMENTIN) 500-125  MG tablet, Take 1 tablet (500 mg total) by mouth 3 (three) times daily., Disp: 30 tablet, Rfl: 0 .  Magnesium 250 MG TABS, Take 250 mg by mouth daily., Disp: , Rfl:  .  Potassium 99 MG TABS, Take 99 mg by mouth daily., Disp: , Rfl:  .  vitamin B-12 (CYANOCOBALAMIN) 1000 MCG tablet, Take 1,000 mcg by mouth daily., Disp: , Rfl:   Current Facility-Administered Medications:  .  albuterol (PROVENTIL) (2.5 MG/3ML) 0.083% nebulizer solution 2.5 mg, 2.5 mg, Nebulization, Once, Nathanie Ottley E, NP No Known Allergies Review of Systems  Constitutional: Positive for chills, fever and malaise/fatigue.  HENT: Positive for congestion, ear pain, sinus pain and sore throat. Negative  for hearing loss and nosebleeds.        Jaw pain   Eyes: Negative for blurred vision and pain.  Respiratory: Positive for cough, sputum production and wheezing. Negative for hemoptysis, shortness of breath and stridor.        Thick, yellow mucous   Cardiovascular: Negative for chest pain and palpitations.  Gastrointestinal: Negative for nausea and vomiting.  Skin: Negative for rash.  Neurological: Positive for headaches. Negative for dizziness and focal weakness.  Psychiatric/Behavioral: Positive for depression. Negative for suicidal ideas.    No other specific complaints in a complete review of systems (except as listed in HPI above). Objective Vitals:   04/24/18 1505  BP: 120/80  Pulse: 95  Resp: 18  Temp: 97.7 F (36.5 C)  TempSrc: Oral  SpO2: 93%  Weight: 112 lb 4.8 oz (50.9 kg)  Height: 5\' 3"  (1.6 m)    Body mass index is 19.89 kg/m. Nursing Note and Vital Signs reviewed. Physical Exam Constitutional:      Appearance: She is ill-appearing.  HENT:     Head: Normocephalic and atraumatic.     Right Ear: Tympanic membrane normal.     Left Ear: Tympanic membrane normal.     Nose:     Right Turbinates: Not enlarged or swollen.     Left Turbinates: Not enlarged or swollen.     Right Sinus: Maxillary sinus tenderness present. No frontal sinus tenderness.     Left Sinus: Maxillary sinus tenderness present. No frontal sinus tenderness.     Mouth/Throat:     Mouth: Mucous membranes are moist.  Cardiovascular:     Rate and Rhythm: Normal rate and regular rhythm.     Pulses: Normal pulses.     Heart sounds: Normal heart sounds.  Pulmonary:     Breath sounds: Wheezing present.     Comments: Clears with albuterol treatment  Skin:    General: Skin is warm and dry.  Neurological:     Mental Status: She is alert and oriented to person, place, and time.  Psychiatric:        Attention and Perception: Attention and perception normal.        Mood and Affect: Mood is  anxious. Affect is tearful.        Behavior: Behavior is cooperative.     ? Results for orders placed or performed in visit on 04/24/18 (from the past 48 hour(s))  POCT Influenza A/B     Status: Normal   Collection Time: 04/24/18  3:29 PM  Result Value Ref Range   Influenza A, POC Negative Negative   Influenza B, POC Negative Negative   Assessment & Plan 1. Acute non-recurrent maxillary sinusitis - amoxicillin-clavulanate (AUGMENTIN) 500-125 MG tablet; Take 1 tablet (500 mg total) by mouth 3 (three) times  daily.  Dispense: 30 tablet; Refill: 0  2. Adventitious breath sounds Cleared with albuterol treatment- likely has COPD; discussed pfts- patient self pay [facility].  - [facility] - albuterol (PROVENTIL HFA;VENTOLIN HFA) 108 (90 Base) MCG/ACT inhaler; Inhale 2 puffs into the lungs every 6 (six) hours as needed for wheezing or shortness of breath.  Dispense: 1 Inhaler; Refill: 0 - albuterol (PROVENTIL) (2.5 MG/3ML) 0.083% nebulizer solution 2.5 mg - amoxicillin-clavulanate (AUGMENTIN) 500-125 MG tablet; Take 1 tablet (500 mg total) by mouth 3 (three) times daily.  Dispense: 30 tablet; Refill: 0  3. Encounter for screening for malignant neoplasm of respiratory organs - CT CHEST LUNG CA SCREEN LOW DOSE W/O CM; Future  4. Financial difficulties Unable to afford medications- statin, albuterol and other routine meds.  - [facility]  5. Tobacco abuse counseling - discussed greater than 3 minutes; plan to cut down in weekly increments.   6. Cough - POCT Influenza A/B  7. Chills Patient strong concern for flu due to friends illness- reassurance provided.  - POCT Influenza A/B   ? -Red flags and when to present for emergency care or RTC including fever >101.1F, chest pain, shortness of breath, new/worsening/un-resolving symptoms,  reviewed with patient at time of visit. Follow up and care instructions  discussed and provided in AVS.

## 2018-04-25 ENCOUNTER — Telehealth: Payer: Self-pay | Admitting: *Deleted

## 2018-04-25 NOTE — Telephone Encounter (Signed)
Received referral for low dose lung cancer screening CT scan. ATtempted to leave message at phone number listed in EMR for patient to call me back to facilitate scheduling scan. However, this option is not available.

## 2018-04-26 ENCOUNTER — Telehealth: Payer: Self-pay | Admitting: *Deleted

## 2018-04-26 NOTE — Telephone Encounter (Signed)
Received referral for lung screening scan. Attempted to contact and schedule lung screening, however, patient is very upset about a friend who is sick and she can not talk about anything at this time. I attempted to console her and explained that I would contact her at a later date.

## 2018-05-04 ENCOUNTER — Telehealth: Payer: Self-pay

## 2018-05-04 ENCOUNTER — Telehealth: Payer: Self-pay | Admitting: *Deleted

## 2018-05-04 NOTE — Telephone Encounter (Signed)
Copied from [place] [phone]. Topic: Referral - Status >> May 04, 2018  9:35 PM Simone Curia D wrote: 09/12/7791 Attempted to contact patient regarding financial assistance for proventil.  Unable to leave message voicemail not set-up.  Will attempt to call again 05/07/2018.MA

## 2018-05-04 NOTE — Telephone Encounter (Signed)
Received referral for low dose lung cancer screening CT scan. Message left at phone number listed in EMR for patient to call me back to facilitate scheduling scan.  

## 2018-05-07 ENCOUNTER — Telehealth: Payer: Self-pay | Admitting: *Deleted

## 2018-05-07 ENCOUNTER — Telehealth: Payer: Self-pay

## 2018-05-07 DIAGNOSIS — Z122 Encounter for screening for malignant neoplasm of respiratory organs: Secondary | ICD-10-CM

## 2018-05-07 DIAGNOSIS — Z87891 Personal history of nicotine dependence: Secondary | ICD-10-CM

## 2018-05-07 NOTE — Telephone Encounter (Signed)
Copied from [place] [phone]. Topic: Referral - Status >> May 07, 2018  6:04 PM Simone Curia D wrote: 7/99/8721 Attempted to contact patient regarding financial assistance for proventil.  Unable to leave message voicemail not set-up.  Will attempt to call again 05/08/2018.MA

## 2018-05-07 NOTE — Telephone Encounter (Signed)
Received referral for initial lung cancer screening scan. Contacted patient and obtained smoking history,(current, 36 pack year) as well as answering questions related to screening process. Patient denies signs of lung cancer such as weight loss or hemoptysis. Patient denies comorbidity that would prevent curative treatment if lung cancer were found. Patient is scheduled for shared decision making visit and CT scan on 05/22/18 at 145pm.

## 2018-05-07 NOTE — Telephone Encounter (Signed)
Contacted patient to discuss lung cancer screening scan. Patient refuses to have lung cancer screening scan at this time. Patient is concerned about her uninsured status but is made aware that we can assist her with the expense of the scan.

## 2018-05-07 NOTE — Telephone Encounter (Signed)
Thank you so much! I really appreciate all you have done!

## 2018-05-09 ENCOUNTER — Telehealth: Payer: Self-pay

## 2018-05-09 NOTE — Telephone Encounter (Signed)
Copied from [place] [phone]. Topic: Referral - Status >> May 09, 2018  1:59 PM Simone Curia D wrote: 5/39/6728 Attempted to call unable to leave message, no vm set-up,will attempt again 05/10/2018.MA

## 2018-05-10 ENCOUNTER — Telehealth: Payer: Self-pay

## 2018-05-10 NOTE — Telephone Encounter (Signed)
Copied from [place] [phone]. Topic: Referral - Status >> May 10, 2018  1:54 PM Simone Curia D wrote: Damaris Schooner with patient about [employer] Patient Assistance Program and [place].  Patient asked that I send a message to Suezanne Cheshire, NP regarding an antibiotic for respiratory infection currently on amoxicillin, patient feels she may need something stronger not feeling better. Sent message to Suezanne Cheshire, NP via [employer].MA

## 2018-05-11 NOTE — Telephone Encounter (Signed)
Tried to contact pt and no answer, no vm.

## 2018-05-11 NOTE — Telephone Encounter (Signed)
Will you please call and schedule a 40 minute appointment with Dr. Sanda Klein or me for this patient within the next week.

## 2018-05-11 NOTE — Telephone Encounter (Signed)
-----   Message from Westley Chandler sent at 05/10/2018  3:44 PM EST ----- Regarding: Patient not feeling well Hi Benjamine Mola,  I just spoke with Lucianne Muss regarding community resources to afford her medication.  While I was talking to her she stated that she still did not feel well and felt the amoxicillin was not working.  She also sounded extremely depressed and stated several times "I just don't know what to do, I don't know what I'm doing". I told her I would forward a message to you.  Thank you   [place] / [place], [place] [address] [phone] [email or web address]: [email or web address]

## 2018-05-22 ENCOUNTER — Ambulatory Visit: Payer: Self-pay

## 2018-05-22 ENCOUNTER — Telehealth: Payer: Self-pay | Admitting: Nurse Practitioner

## 2018-05-22 ENCOUNTER — Inpatient Hospital Stay: Payer: Self-pay | Admitting: Oncology

## 2018-05-22 NOTE — Telephone Encounter (Signed)
Pt saw Yvonne Scott on 04/24/2018 for the below issue.   Copied from [place] [id number]. Topic: Quick Communication - See Telephone Encounter >> May 22, 2018 10:37 AM Bea Graff, NT wrote: CRM for notification. See Telephone encounter for: 05/22/18. Pt states that she has been in and out of the hospital since being seen on 04/24/2018 and states the hospital told her to call to see if she can have another round of amoxicillin-clavulanate (AUGMENTIN) 500-125 MG tablet called in for her congestion. Advised pt she may need to come in for an appt before this can be sent in. Please advise.

## 2018-05-22 NOTE — Telephone Encounter (Signed)
Not our pt

## 2018-05-23 NOTE — Telephone Encounter (Signed)
Patient was seen last month and given Augmentin for appropriate time frame. If it did not resolve symptoms completely bacteria may either be resistant or she has another condition. Will need to be evaluated to see if antibiotic therapy or further testing is appropriate for condition.

## 2018-05-23 NOTE — Telephone Encounter (Signed)
Please call patient and inform her that no additional atb will be sent in but she is welcome to come in for a f/u visit.

## 2018-05-23 NOTE — Telephone Encounter (Signed)
Tried to contact [phone] and no answer and no voice mail picked up.

## 2018-08-02 ENCOUNTER — Telehealth: Payer: Self-pay | Admitting: *Deleted

## 2018-08-02 NOTE — Telephone Encounter (Signed)
Left voicemail in attempt to reschedule lung screening scan.

## 2018-08-17 ENCOUNTER — Telehealth: Payer: Self-pay | Admitting: *Deleted

## 2018-08-17 DIAGNOSIS — Z122 Encounter for screening for malignant neoplasm of respiratory organs: Secondary | ICD-10-CM

## 2018-08-17 DIAGNOSIS — Z87891 Personal history of nicotine dependence: Secondary | ICD-10-CM

## 2018-08-17 NOTE — Telephone Encounter (Signed)
Received referral for initial lung cancer screening scan. Contacted patient and obtained smoking history,(current, 36 pack year) as well as answering questions related to screening process. Patient denies signs of lung cancer such as weight loss or hemoptysis. Patient denies comorbidity that would prevent curative treatment if lung cancer were found. Patient is scheduled for shared decision making visit and CT scan on 08/22/18 at 915am.

## 2018-08-22 ENCOUNTER — Ambulatory Visit: Admission: RE | Admit: 2018-08-22 | Payer: Self-pay | Source: Ambulatory Visit

## 2018-08-22 ENCOUNTER — Inpatient Hospital Stay: Payer: Self-pay | Admitting: Oncology

## 2018-09-17 ENCOUNTER — Encounter: Payer: Self-pay | Admitting: *Deleted

## 2018-10-12 ENCOUNTER — Telehealth: Payer: Self-pay | Admitting: *Deleted

## 2018-10-12 NOTE — Telephone Encounter (Signed)
Patient is rescheduled for lung screening scan. See prior documentation for eligibility information.

## 2018-10-16 ENCOUNTER — Inpatient Hospital Stay: Payer: Self-pay | Attending: Oncology | Admitting: Hospice and Palliative Medicine

## 2018-10-16 ENCOUNTER — Ambulatory Visit
Admission: RE | Admit: 2018-10-16 | Discharge: 2018-10-16 | Disposition: A | Discharge status: Home / Self Care | Payer: Self-pay | Source: Ambulatory Visit | Attending: Oncology | Admitting: Oncology

## 2018-10-16 ENCOUNTER — Other Ambulatory Visit: Payer: Self-pay

## 2018-10-16 DIAGNOSIS — Z122 Encounter for screening for malignant neoplasm of respiratory organs: Secondary | ICD-10-CM

## 2018-10-16 DIAGNOSIS — Z87891 Personal history of nicotine dependence: Secondary | ICD-10-CM

## 2018-10-16 NOTE — Progress Notes (Signed)
In accordance with CMS guidelines, patient has met eligibility criteria including age, absence of signs or symptoms of lung cancer.  Social History   Tobacco Use  . Smoking status: Current Every Day Smoker    Packs/day: 1.00    Years: 36.00    Pack years: 36.00    Types: Cigarettes    Start date: 03/15/1979  . Smokeless tobacco: Never Used  Substance Use Topics  . Alcohol use: No    Alcohol/week: 3.0 standard drinks    Types: 3 Standard drinks or equivalent per week  . Drug use: No      A shared decision-making session was conducted prior to the performance of CT scan. This includes one or more decision aids, includes benefits and harms of screening, follow-up diagnostic testing, over-diagnosis, false positive rate, and total radiation exposure.   Counseling on the importance of adherence to annual lung cancer LDCT screening, impact of co-morbidities, and ability or willingness to undergo diagnosis and treatment is imperative for compliance of the program.   Counseling on the importance of continued smoking cessation for former smokers; the importance of smoking cessation for current smokers, and information about tobacco cessation interventions have been given to patient including [place] and [facility] quit [place] programs.   Written order for lung cancer screening with LDCT has been given to the patient and any and all questions have been answered to the best of my abilities.    Yearly follow up will be coordinated by Burgess Estelle, Thoracic Navigator.  Time Total: 15 minutes  Visit consisted of counseling and education dealing with complex health screening. Greater than 50%  of this time was spent counseling and coordinating care related to the above assessment and plan.  Signed by: Altha Harm, PhD, NP-C [phone] (Work Cell)

## 2018-10-18 ENCOUNTER — Encounter: Payer: Self-pay | Admitting: *Deleted

## 2018-11-03 ENCOUNTER — Other Ambulatory Visit: Payer: Self-pay | Admitting: Nurse Practitioner

## 2018-11-03 DIAGNOSIS — R0689 Other abnormalities of breathing: Secondary | ICD-10-CM

## 2018-11-10 ENCOUNTER — Other Ambulatory Visit: Payer: Self-pay | Admitting: Nurse Practitioner

## 2018-11-10 DIAGNOSIS — R0689 Other abnormalities of breathing: Secondary | ICD-10-CM

## 2018-11-12 ENCOUNTER — Other Ambulatory Visit: Payer: Self-pay | Admitting: Nurse Practitioner

## 2018-11-12 DIAGNOSIS — R0689 Other abnormalities of breathing: Secondary | ICD-10-CM

## 2018-11-12 NOTE — Telephone Encounter (Signed)
Will you see if patient is still following here or has new PCP- refill keeps coming back to [place] but new PCP is listed but do not see appointment with them.

## 2018-11-12 NOTE — Telephone Encounter (Signed)
Left voice mail

## 2018-11-13 NOTE — Telephone Encounter (Signed)
Left another voicemail will refuse at this time

## 2019-09-17 NOTE — Progress Notes (Deleted)
Patient is a 59 year old female Her last office visit at [facility] was in February 2020 for a sinusitis concerns. The most recent prior visit for follow-up of medical problems was in late 2018. She follows up today She noted she does not have insurance presently.   HTN Medication regimen- BP Readings from Last 3 Encounters:  04/24/18 120/80  01/27/17 112/64  12/19/16 109/76    Hyperlipidemia;  Medication regimen-on statin;  Lab Results  Component Value Date   CHOL 161 01/27/2017   HDL 58 01/27/2017   LDLCALC 80 01/27/2017   TRIG 124 01/27/2017   CHOLHDL 2.8 01/27/2017    Borderline diabetes Lab Results  Component Value Date   HGBA1C 5.1 01/27/2017   HGBA1C 5.1 02/17/2014   Lab Results  Component Value Date   LDLCALC 80 01/27/2017   CREATININE 0.77 01/27/2017      Tobacco dependence-she is still smoking, 1 ppd;   Alcohol-denies use

## 2019-09-18 ENCOUNTER — Ambulatory Visit: Payer: Self-pay | Admitting: Internal Medicine

## 2019-09-29 ENCOUNTER — Telehealth: Payer: Self-pay

## 2019-09-29 NOTE — Telephone Encounter (Signed)
Contacted patient to schedule her annual lung screening CT scan. Patient states she is dealing with some "breast issues" right now and asked our clinic to cal her back in a few months to schedule the scan.

## 2019-12-15 ENCOUNTER — Encounter: Payer: Self-pay | Admitting: Radiology

## 2019-12-15 ENCOUNTER — Other Ambulatory Visit: Payer: Self-pay

## 2019-12-15 ENCOUNTER — Emergency Department: Payer: Self-pay

## 2019-12-15 DIAGNOSIS — F17210 Nicotine dependence, cigarettes, uncomplicated: Secondary | ICD-10-CM | POA: Insufficient documentation

## 2019-12-15 DIAGNOSIS — N6320 Unspecified lump in the left breast, unspecified quadrant: Secondary | ICD-10-CM | POA: Insufficient documentation

## 2019-12-15 LAB — COMPREHENSIVE METABOLIC PANEL
ALT: 14 U/L (ref 0–44)
AST: 25 U/L (ref 15–41)
Albumin: 4.3 g/dL (ref 3.5–5.0)
Alkaline Phosphatase: 68 U/L (ref 38–126)
Anion gap: 12 (ref 5–15)
BUN: <5 mg/dL — ABNORMAL LOW (ref 6–20)
CO2: 23 mmol/L (ref 22–32)
Calcium: 8.8 mg/dL — ABNORMAL LOW (ref 8.9–10.3)
Chloride: 103 mmol/L (ref 98–111)
Creatinine, Ser: 0.58 mg/dL (ref 0.44–1.00)
GFR calc Af Amer: >60 mL/min (ref >60)
GFR calc non Af Amer: >60 mL/min (ref >60)
Glucose, Bld: 95 mg/dL (ref 70–99)
Potassium: 3.8 mmol/L (ref 3.5–5.1)
Sodium: 138 mmol/L (ref 135–145)
Total Bilirubin: 0.4 mg/dL (ref 0.3–1.2)
Total Protein: 7.5 g/dL (ref 6.5–8.1)

## 2019-12-15 LAB — CBC WITH DIFFERENTIAL/PLATELET
Abs Immature Granulocytes: 0.02 10*3/uL (ref 0.00–0.07)
Basophils Absolute: 0.1 10*3/uL (ref 0.0–0.1)
Basophils Relative: 1 %
Eosinophils Absolute: 0.1 10*3/uL (ref 0.0–0.5)
Eosinophils Relative: 2 %
HCT: 39.7 % (ref 36.0–46.0)
Hemoglobin: 14 g/dL (ref 12.0–15.0)
Immature Granulocytes: 0 %
Lymphocytes Relative: 52 %
Lymphs Abs: 3.4 10*3/uL (ref 0.7–4.0)
MCH: 31.2 pg (ref 26.0–34.0)
MCHC: 35.3 g/dL (ref 30.0–36.0)
MCV: 88.4 fL (ref 80.0–100.0)
Monocytes Absolute: 0.3 10*3/uL (ref 0.1–1.0)
Monocytes Relative: 4 %
Neutro Abs: 2.7 10*3/uL (ref 1.7–7.7)
Neutrophils Relative %: 41 %
Platelets: 318 10*3/uL (ref 150–400)
RBC: 4.49 MIL/uL (ref 3.87–5.11)
RDW: 14.6 % (ref 11.5–15.5)
WBC: 6.6 10*3/uL (ref 4.0–10.5)
nRBC: 0 % (ref 0.0–0.2)

## 2019-12-15 LAB — TROPONIN I (HIGH SENSITIVITY): Troponin I (High Sensitivity): 4 ng/L (ref <18)

## 2019-12-15 NOTE — ED Triage Notes (Signed)
First Nurse: patient brought in by ems from home. Patient with complaint of shortness of breath. Ems reports that when patient was walking to ems truck that her legs gave out. Per ems oxygen saturation 94% on room air and bp 167/76.

## 2019-12-15 NOTE — ED Triage Notes (Signed)
Patient reports short of breath, elevated blood pressure, anxiety.  Patient also reports that she noticed a knot on right breast several months ago that has been hurting (has not gotten it checked).

## 2019-12-16 ENCOUNTER — Emergency Department
Admission: EM | Admit: 2019-12-16 | Discharge: 2019-12-16 | Disposition: A | Discharge status: Home / Self Care | Payer: Self-pay | Attending: Emergency Medicine | Admitting: Emergency Medicine

## 2019-12-16 ENCOUNTER — Telehealth: Payer: Self-pay | Admitting: Obstetrics & Gynecology

## 2019-12-16 DIAGNOSIS — N6310 Unspecified lump in the right breast, unspecified quadrant: Secondary | ICD-10-CM

## 2019-12-16 NOTE — ED Notes (Signed)
Pt states she has not seen a doctor since Feb of 2020. She was taking medication for her hypertension but has not taken any since it ran out in 2020. Pt reports Hx of SoB which she was told may be emphysema. It became worse last night along with a spike in her BP which brought her in. She has a secondary complaint of chronic breast pain beginning in 2004.

## 2019-12-16 NOTE — Telephone Encounter (Signed)
Patient is scheduled for 04/05/19 with [facility]

## 2019-12-16 NOTE — Telephone Encounter (Signed)
-----   Message from Malachy Mood, MD sent at 12/16/2019  9:44 AM EDT ----- Regarding: ER Follow ER follow up in the next 2 weeks any provider

## 2019-12-16 NOTE — Discharge Instructions (Signed)
As we discussed, your work-up tonight including your x-rays and lab work were reassuring tonight.  However you definitely should follow-up as an outpatient for further evaluation of your right-sided breast mass.  I recommend calling the [place] [facility] care center at the phone number included above.  When you call them, explain you were in the emergency department and that we recommended you call them to schedule a follow-up appointment.  If they are unable to schedule an appointment with a provider at their center, try calling the oncology center and schedule an appointment with Dr. Rogue Bussing or one of his colleagues.  They should be able to arrange for you the appropriate follow-up.  If you call them, please do the same thing and let them know you are following up from an emergency department visit.  Although you do not have a specific diagnosis, please read through the included information about fibroadenoma, a common reason for women to have lumps or masses in their breasts.  Follow-up at the next available opportunity.  Continue taking your regular medications.

## 2019-12-16 NOTE — ED Provider Notes (Signed)
[facility] Emergency Department Provider Note  ____________________________________________   First MD Initiated Contact with Patient 12/16/19 [phone]     (approximate)  I have reviewed the triage vital signs and the nursing notes.   HISTORY  Chief Complaint Shortness of Breath, Anxiety, and Breast Pain    HPI Yvonne Scott is a 59 y.o. female who presents for evaluation of a painful mass in her right breast.  She said that she has had it for more than 20 years.  She believes it was about 20 years ago when she had a mammogram and they can see it on the mammogram so she has not had another evaluation since that time.  She says that gradually over time it is gotten bigger and it can be very painful at times.  No skin changes.  She said that sometimes it hurts when she takes a deep breath but she also smokes and has a chronic cough.  She became anxious last night because her breast was hurting.  She said that she used to have a primary care doctor but the doctor recently left are retired and now she does not know whom to follow-up with.  She has not had any discharge from her nipple.  She denies fever, sore throat, chest pain, loss of smell or taste, nausea, vomiting, and abdominal pain.  Nothing in particular makes it better or worse.         Past Medical History:  Diagnosis Date  . Anxiety   . Aortic atherosclerosis ([place]) 10/24/2016  . GERD (gastroesophageal reflux disease)     Patient Active Problem List   Diagnosis Date Noted  . Hyponatremia 01/30/2017  . Hypochloremia 01/30/2017  . Hypokalemia 01/27/2017  . Hyperglycemia 01/27/2017  . Back pain 12/15/2016  . Chronic lower back pain 12/15/2016  . Elevated ferritin 11/07/2016  . Aortic atherosclerosis ([place]) 10/24/2016  . Fatty liver 10/24/2016  . Medication monitoring encounter 11/19/2015  . GERD (gastroesophageal reflux disease) 11/19/2015  . Tobacco abuse 11/19/2015  . Essential hypertension,  benign 11/19/2015    No past surgical history on file.  Prior to Admission medications   Medication Sig Start Date End Date Taking? Authorizing Provider  albuterol (VENTOLIN HFA) 108 (90 Base) MCG/ACT inhaler INHALE 2 PUFFS BY MOUTH EVERY 6 HOURS AS NEEDED FOR WHEEZING OR SHORTNESS OF BREATH 11/05/18   Poulose, Bethel Born, NP  amoxicillin-clavulanate (AUGMENTIN) 500-125 MG tablet Take 1 tablet (500 mg total) by mouth 3 (three) times daily. 04/24/18   Poulose, Bethel Born, NP  fluticasone (FLONASE) 50 MCG/ACT nasal spray Place 1 spray into both nostrils daily. 10/18/16   Gladstone Lighter, MD  guaiFENesin (MUCINEX) 600 MG 12 hr tablet Take 2 tablets (1,200 mg total) by mouth 2 (two) times daily. 10/17/16   Gladstone Lighter, MD  Magnesium 250 MG TABS Take 250 mg by mouth daily.    [provider]  Potassium 99 MG TABS Take 99 mg by mouth daily.    [provider]  vitamin B-12 (CYANOCOBALAMIN) 1000 MCG tablet Take 1,000 mcg by mouth daily.    [provider]    Allergies Patient has no known allergies.  Family History  Problem Relation Age of Onset  . Cancer Mother 72       breast  . Head & neck cancer Father   . Cancer Father        melonma  . Cirrhosis Maternal Grandfather   . Cancer Paternal Grandmother  skin/breast    Social History Social History   Tobacco Use  . Smoking status: Current Every Day Smoker    Packs/day: 1.00    Years: 36.00    Pack years: 36.00    Types: Cigarettes    Start date: 03/15/1979  . Smokeless tobacco: Never Used  Vaping Use  . Vaping Use: Never used  Substance Use Topics  . Alcohol use: No    Alcohol/week: 3.0 standard drinks    Types: 3 Standard drinks or equivalent per week  . Drug use: No    Review of Systems Constitutional: No fever/chills Eyes: No visual changes. ENT: No sore throat. Cardiovascular: Denies chest pain other than the right-sided breast pain and mass previously described. Respiratory:  Chronic shortness of breath with a diagnosis of emphysema and chronic cough. Gastrointestinal: No abdominal pain.  No nausea, no vomiting.  No diarrhea.  No constipation. Genitourinary: Negative for dysuria. Musculoskeletal: Negative for neck pain.  Negative for back pain. Integumentary: Negative for rash. Neurological: Negative for headaches, focal weakness or numbness.   ____________________________________________   PHYSICAL EXAM:  VITAL SIGNS: ED Triage Vitals  Enc Vitals Group     BP 12/15/19 2131 136/74     Pulse Rate 12/15/19 2131 78     Resp 12/15/19 2131 (!) 24     Temp 12/15/19 2131 97.9 F (36.6 C)     Temp Source 12/15/19 2131 Oral     SpO2 12/15/19 2131 94 %     Weight 12/15/19 2131 58.1 kg (128 lb)     Height 12/15/19 2131 1.575 m (5\' 2" )     Head Circumference --      Peak Flow --      Pain Score 12/15/19 2135 10     Pain Loc --      Pain Edu? --      Excl. in [place]? --     Constitutional: Alert and oriented.  Eyes: Conjunctivae are normal.  Head: Atraumatic. Nose: No congestion/rhinnorhea. Mouth/Throat: Patient is wearing a mask. Neck: No stridor.  No meningeal signs.   Cardiovascular: Normal rate, regular rhythm. Good peripheral circulation. Grossly normal heart sounds. Respiratory: Normal respiratory effort.  No retractions. Gastrointestinal: Soft and nontender. No distention.  Musculoskeletal: The patient has a normal-appearing right breast.    no lower extremity tenderness nor edema. No gross deformities of extremities. Neurologic:  Normal speech and language. No gross focal neurologic deficits are appreciated.  Skin:  Skin is warm, dry and intact.  She has a palpable rubbery mass that is relatively large (at least 4 cm in diameter) and located in the lower and right side of her right breast.  There is no nipple involvement, no skin changes, no discoloration.  The patient's nurse Gershon Mussel was present in the room during my exam as chaperone (limited ED staffing  precluded being able to find a female chaperone). Psychiatric: Mood and affect are somewhat anxious but generally appropriate and certainly not indicative of an emergent psychiatric condition.  ____________________________________________   LABS (all labs ordered are listed, but only abnormal results are displayed)  Labs Reviewed  COMPREHENSIVE METABOLIC PANEL - Abnormal; Notable for the following components:      Result Value   BUN <5 (*)    Calcium 8.8 (*)    All other components within normal limits  CBC WITH DIFFERENTIAL/PLATELET  TROPONIN I (HIGH SENSITIVITY)  TROPONIN I (HIGH SENSITIVITY)   ____________________________________________  EKG  ED ECG REPORT I, Hinda Kehr, the attending physician, personally  viewed and interpreted this ECG.  Date: 12/15/2019 EKG Time: 21: 31 Rate: 76 Rhythm: normal sinus rhythm QRS Axis: normal Intervals: normal ST/T Wave abnormalities: Non-specific ST segment / T-wave changes, but no clear evidence of acute ischemia. Narrative Interpretation: no definitive evidence of acute ischemia; does not meet STEMI criteria.   ____________________________________________  RADIOLOGY I, Hinda Kehr, personally viewed and evaluated these images (plain radiographs) as part of my medical decision making, as well as reviewing the written report by the radiologist.  ED MD interpretation: Possible mild bronchitis or chronic emphysema, no other obvious abnormalities  Official radiology report(s): DG Chest 2 View  Result Date: 12/15/2019 CLINICAL DATA:  Shortness of breath EXAM: CHEST - 2 VIEW COMPARISON:  October 14, 2016 FINDINGS: The heart size and mediastinal contours are within normal limits. Mild peribronchial cuffing seen within the perihilar regions and right middle lobe. No large airspace consolidation or pleural effusion. The visualized skeletal structures are unremarkable. IMPRESSION: Findings which could be suggestive of mild bronchitis or  reactive airway disease. Electronically Signed   By: Prudencio Pair M.D.   On: 12/15/2019 22:11    ____________________________________________   PROCEDURES   Procedure(s) performed (including Critical Care):  Procedures   ____________________________________________   INITIAL IMPRESSION / MDM / [place] / ED COURSE  As part of my medical decision making, I reviewed the following data within the [place] notes reviewed and incorporated, Labs reviewed , EKG interpreted , Old chart reviewed, Radiograph reviewed  and Notes from prior ED visits   Differential diagnosis includes, but is not limited to, fibroadenoma, neoplasm, abscess, other nonspecific benign breast mass.  The patient voiced no other complaints or concerns to me other than the chronic breast mass that apparently has been slowly growing over time and has become more painful.  I explained that her work-up is reassuring tonight and that she should see a breast specialist but I do not have the ability for further diagnosis and evaluation and management in the emergency department.  I provided her with contact information for the [facility] breast care center as well as with information about medical oncology should she not be able to schedule an appointment directly with the [facility].  Her lab results are reassuring as are her EKG and chest x-ray and there is no indication of an emergent condition tonight.  Her physical exam does not suggest an infectious process or, frankly, and neoplastic process, but I strongly encouraged her to follow-up as an outpatient.  She understands and agrees with the plan.           ____________________________________________  FINAL CLINICAL IMPRESSION(S) / ED DIAGNOSES  Final diagnoses:  Breast mass, right     MEDICATIONS GIVEN DURING THIS VISIT:  Medications - No data to display   ED Discharge Orders    None      *Please note:  MINDA FAAS was evaluated in Emergency Department on 12/16/2019 for the symptoms described in the history of present illness. She was evaluated in the context of the global COVID-19 pandemic, which necessitated consideration that the patient might be at risk for infection with the SARS-CoV-2 virus that causes COVID-19. Institutional protocols and algorithms that pertain to the evaluation of patients at risk for COVID-19 are in a state of rapid change based on information released by regulatory bodies including the CDC and federal and state organizations. These policies and algorithms were followed during the patient's care in the ED.  Some  ED evaluations and interventions may be delayed as a result of limited staffing during and after the pandemic.*  Note:  This document was prepared using Dragon voice recognition software and may include unintentional dictation errors.   Hinda Kehr, MD 12/16/19 [phone]

## 2020-01-03 ENCOUNTER — Encounter: Payer: Self-pay | Admitting: Obstetrics & Gynecology

## 2020-01-22 ENCOUNTER — Telehealth: Payer: Self-pay | Admitting: *Deleted

## 2020-01-22 NOTE — Telephone Encounter (Signed)
Attempted to contact regarding scheduling annual lung screening scan. However, there is no answer or voicemail option.

## 2020-01-28 ENCOUNTER — Telehealth: Payer: Self-pay | Admitting: *Deleted

## 2020-01-28 NOTE — Telephone Encounter (Signed)
Attempted to contact to schedule lung screening scan. However, there is no answer or voicemail option.

## 2020-02-20 ENCOUNTER — Emergency Department: Payer: Self-pay

## 2020-02-20 ENCOUNTER — Other Ambulatory Visit: Payer: Self-pay

## 2020-02-20 ENCOUNTER — Inpatient Hospital Stay
Admission: EM | Admit: 2020-02-20 | Discharge: 2020-02-26 | DRG: 640 | Disposition: A | Discharge status: Home / Self Care | Payer: Self-pay | Attending: Internal Medicine | Admitting: Internal Medicine

## 2020-02-20 ENCOUNTER — Encounter: Payer: Self-pay | Admitting: Emergency Medicine

## 2020-02-20 DIAGNOSIS — E872 Acidosis, unspecified: Secondary | ICD-10-CM | POA: Diagnosis present

## 2020-02-20 DIAGNOSIS — R7989 Other specified abnormal findings of blood chemistry: Secondary | ICD-10-CM | POA: Diagnosis present

## 2020-02-20 DIAGNOSIS — K760 Fatty (change of) liver, not elsewhere classified: Secondary | ICD-10-CM | POA: Diagnosis present

## 2020-02-20 DIAGNOSIS — D72829 Elevated white blood cell count, unspecified: Secondary | ICD-10-CM

## 2020-02-20 DIAGNOSIS — Z808 Family history of malignant neoplasm of other organs or systems: Secondary | ICD-10-CM

## 2020-02-20 DIAGNOSIS — Z20822 Contact with and (suspected) exposure to covid-19: Secondary | ICD-10-CM | POA: Diagnosis present

## 2020-02-20 DIAGNOSIS — F17210 Nicotine dependence, cigarettes, uncomplicated: Secondary | ICD-10-CM | POA: Diagnosis present

## 2020-02-20 DIAGNOSIS — R1010 Upper abdominal pain, unspecified: Secondary | ICD-10-CM | POA: Diagnosis present

## 2020-02-20 DIAGNOSIS — D329 Benign neoplasm of meninges, unspecified: Secondary | ICD-10-CM | POA: Diagnosis present

## 2020-02-20 DIAGNOSIS — J690 Pneumonitis due to inhalation of food and vomit: Secondary | ICD-10-CM

## 2020-02-20 DIAGNOSIS — E860 Dehydration: Secondary | ICD-10-CM | POA: Diagnosis present

## 2020-02-20 DIAGNOSIS — I10 Essential (primary) hypertension: Secondary | ICD-10-CM | POA: Diagnosis present

## 2020-02-20 DIAGNOSIS — R0689 Other abnormalities of breathing: Secondary | ICD-10-CM

## 2020-02-20 DIAGNOSIS — N6310 Unspecified lump in the right breast, unspecified quadrant: Secondary | ICD-10-CM | POA: Diagnosis present

## 2020-02-20 DIAGNOSIS — N179 Acute kidney failure, unspecified: Secondary | ICD-10-CM | POA: Diagnosis present

## 2020-02-20 DIAGNOSIS — R059 Cough, unspecified: Secondary | ICD-10-CM | POA: Diagnosis present

## 2020-02-20 DIAGNOSIS — G9341 Metabolic encephalopathy: Secondary | ICD-10-CM | POA: Diagnosis present

## 2020-02-20 DIAGNOSIS — I959 Hypotension, unspecified: Secondary | ICD-10-CM | POA: Diagnosis present

## 2020-02-20 DIAGNOSIS — E1110 Type 2 diabetes mellitus with ketoacidosis without coma: Secondary | ICD-10-CM | POA: Diagnosis present

## 2020-02-20 DIAGNOSIS — Z803 Family history of malignant neoplasm of breast: Secondary | ICD-10-CM

## 2020-02-20 DIAGNOSIS — R1310 Dysphagia, unspecified: Secondary | ICD-10-CM | POA: Diagnosis present

## 2020-02-20 DIAGNOSIS — E1310 Other specified diabetes mellitus with ketoacidosis without coma: Secondary | ICD-10-CM

## 2020-02-20 DIAGNOSIS — E119 Type 2 diabetes mellitus without complications: Secondary | ICD-10-CM

## 2020-02-20 DIAGNOSIS — E876 Hypokalemia: Secondary | ICD-10-CM | POA: Diagnosis present

## 2020-02-20 DIAGNOSIS — K219 Gastro-esophageal reflux disease without esophagitis: Secondary | ICD-10-CM | POA: Diagnosis present

## 2020-02-20 LAB — TROPONIN I (HIGH SENSITIVITY)
Troponin I (High Sensitivity): 9 ng/L (ref <18)
Troponin I (High Sensitivity): 11 ng/L (ref <18)

## 2020-02-20 LAB — COMPREHENSIVE METABOLIC PANEL
ALT: 75 U/L — ABNORMAL HIGH (ref 0–44)
AST: 57 U/L — ABNORMAL HIGH (ref 15–41)
Albumin: 5.5 g/dL — ABNORMAL HIGH (ref 3.5–5.0)
Alkaline Phosphatase: 90 U/L (ref 38–126)
BUN: 18 mg/dL (ref 6–20)
CO2: <7 mmol/L — ABNORMAL LOW (ref 22–32)
Calcium: 9.7 mg/dL (ref 8.9–10.3)
Chloride: 98 mmol/L (ref 98–111)
Creatinine, Ser: 1.7 mg/dL — ABNORMAL HIGH (ref 0.44–1.00)
GFR, Estimated: 34 mL/min — ABNORMAL LOW (ref >60)
Glucose, Bld: 268 mg/dL — ABNORMAL HIGH (ref 70–99)
Potassium: 4.2 mmol/L (ref 3.5–5.1)
Sodium: 139 mmol/L (ref 135–145)
Total Bilirubin: 2.7 mg/dL — ABNORMAL HIGH (ref 0.3–1.2)
Total Protein: 9.3 g/dL — ABNORMAL HIGH (ref 6.5–8.1)

## 2020-02-20 LAB — CBC
HCT: 49.3 % — ABNORMAL HIGH (ref 36.0–46.0)
Hemoglobin: 15.8 g/dL — ABNORMAL HIGH (ref 12.0–15.0)
MCH: 32.4 pg (ref 26.0–34.0)
MCHC: 32 g/dL (ref 30.0–36.0)
MCV: 101 fL — ABNORMAL HIGH (ref 80.0–100.0)
Platelets: 336 10*3/uL (ref 150–400)
RBC: 4.88 MIL/uL (ref 3.87–5.11)
RDW: 15.6 % — ABNORMAL HIGH (ref 11.5–15.5)
WBC: 15.8 10*3/uL — ABNORMAL HIGH (ref 4.0–10.5)
nRBC: 0 % (ref 0.0–0.2)

## 2020-02-20 LAB — LIPASE, BLOOD: Lipase: 71 U/L — ABNORMAL HIGH (ref 11–51)

## 2020-02-20 MED ORDER — SODIUM CHLORIDE 0.9 % IV BOLUS
1000.0000 mL | Freq: Once | INTRAVENOUS | Status: AC
  Administered 2020-02-21: 1000 mL via INTRAVENOUS

## 2020-02-20 NOTE — ED Triage Notes (Signed)
First RN Note: pt to ED via [facility] with c/o possible dehydration, per EMS pt has had N/V all day today.     32RR 164/108

## 2020-02-20 NOTE — ED Triage Notes (Signed)
Pt comes into the ED via [facility] from home c/o N/V/ shortness of breath.  Pt states that it all started yesterday.  Denies any COPD, asthma, CP, or dizziness.  Pt presents hyperventilating and is able to be redirected into slowing her breathing.  Pt is concerned she may be dehydrated at this time.

## 2020-02-20 NOTE — ED Provider Notes (Signed)
[facility] Emergency Department Provider Note  Time seen: 11:51 PM  I have reviewed the triage vital signs and the nursing notes.   HISTORY  Chief Complaint Shortness of Breath, Nausea, and Emesis   HPI Yvonne Scott is a 59 y.o. female with a past medical history of anxiety, gastric reflux, hypertension, presents to the emergency department for several days of generalized fatigue weakness shortness of breath, nausea, vomiting.  Patient states over the past 3 to 4 days she has been feeling unwell, states she has been under a lot of stress recently with the recent loss of her friend and roommate.  Patient denies frequent alcohol use.  Denies any fever.  Has not been vaccinated against Covid.  Denies any chest pain.  Past Medical History:  Diagnosis Date  . Anxiety   . Aortic atherosclerosis ([place]) 10/24/2016  . GERD (gastroesophageal reflux disease)     Patient Active Problem List   Diagnosis Date Noted  . Hyponatremia 01/30/2017  . Hypochloremia 01/30/2017  . Hypokalemia 01/27/2017  . Hyperglycemia 01/27/2017  . Back pain 12/15/2016  . Chronic lower back pain 12/15/2016  . Elevated ferritin 11/07/2016  . Aortic atherosclerosis ([place]) 10/24/2016  . Fatty liver 10/24/2016  . Medication monitoring encounter 11/19/2015  . GERD (gastroesophageal reflux disease) 11/19/2015  . Tobacco abuse 11/19/2015  . Essential hypertension, benign 11/19/2015    History reviewed. No pertinent surgical history.  Prior to Admission medications   Medication Sig Start Date End Date Taking? Authorizing Provider  albuterol (VENTOLIN HFA) 108 (90 Base) MCG/ACT inhaler INHALE 2 PUFFS BY MOUTH EVERY 6 HOURS AS NEEDED FOR WHEEZING OR SHORTNESS OF BREATH 11/05/18   Poulose, Bethel Born, NP  amoxicillin-clavulanate (AUGMENTIN) 500-125 MG tablet Take 1 tablet (500 mg total) by mouth 3 (three) times daily. 04/24/18   Poulose, Bethel Born, NP  fluticasone (FLONASE) 50 MCG/ACT nasal  spray Place 1 spray into both nostrils daily. 10/18/16   Gladstone Lighter, MD  guaiFENesin (MUCINEX) 600 MG 12 hr tablet Take 2 tablets (1,200 mg total) by mouth 2 (two) times daily. 10/17/16   Gladstone Lighter, MD  Magnesium 250 MG TABS Take 250 mg by mouth daily.    [provider]  Potassium 99 MG TABS Take 99 mg by mouth daily.    [provider]  vitamin B-12 (CYANOCOBALAMIN) 1000 MCG tablet Take 1,000 mcg by mouth daily.    [provider]    No Known Allergies  Family History  Problem Relation Age of Onset  . Cancer Mother 23       breast  . Head & neck cancer Father   . Cancer Father        melonma  . Cirrhosis Maternal Grandfather   . Cancer Paternal Grandmother        skin/breast    Social History Social History   Tobacco Use  . Smoking status: Current Every Day Smoker    Packs/day: 1.00    Years: 36.00    Pack years: 36.00    Types: Cigarettes    Start date: 03/15/1979  . Smokeless tobacco: Never Used  Vaping Use  . Vaping Use: Never used  Substance Use Topics  . Alcohol use: No    Alcohol/week: 3.0 standard drinks    Types: 3 Standard drinks or equivalent per week  . Drug use: No    Review of Systems Constitutional: Negative for fever. Cardiovascular: Negative for chest pain. Respiratory: Positive shortness of breath.  Negative for cough.  Gastrointestinal: Mild abdominal cramping fairly diffuse.  Positive for nausea vomiting  Genitourinary: Negative for urinary compaints Musculoskeletal: Negative for musculoskeletal complaints Neurological: Negative for headache All other ROS negative  ____________________________________________   PHYSICAL EXAM:  VITAL SIGNS: ED Triage Vitals  Enc Vitals Group     BP 02/20/20 1402 120/67     Pulse Rate 02/20/20 1402 (!) 128     Resp 02/20/20 1402 (!) 26     Temp --      Temp src --      SpO2 02/20/20 1402 100 %     Weight 02/20/20 1403 120 lb (54.4 kg)     Height 02/20/20 1403 5'  4" (1.626 m)     Head Circumference --      Peak Flow --      Pain Score 02/20/20 1403 10     Pain Loc --      Pain Edu? --      Excl. in [place]? --    Constitutional: Alert and oriented.  [place] type respirations. Eyes: Normal exam ENT      Head: Normocephalic and atraumatic.      Mouth/Throat: Mucous membranes are moist. Cardiovascular: Normal rate, regular rhythm. Respiratory: patient has Kussmaul type respirations approximately 25 breaths/min. Gastrointestinal: Soft, mild upper abdominal tenderness without rebound guarding or distention. Musculoskeletal: Nontender with normal range of motion in all extremities. Neurologic:  Normal speech and language. No gross focal neurologic deficits Skin:  Skin is warm, dry and intact.  Psychiatric: Mood and affect are normal.  ____________________________________________    EKG  EKG viewed and interpreted by myself shows sinus tachycardia 129 bpm with a narrow QRS, normal axis, normal intervals with nonspecific ST changes and electrical interference.  ____________________________________________    RADIOLOGY  Chest x-ray is negative  ____________________________________________   INITIAL IMPRESSION / ASSESSMENT AND PLAN / ED COURSE  Pertinent labs & imaging results that were available during my care of the patient were reviewed by me and considered in my medical decision making (see chart for details).   Patient presents emergency department for several days of feeling generally unwell along with nausea vomiting shortness of breath.  States she has not been able to eat for the past 2 to 3 days due to nausea and vomiting.  Patient's lab work found to be quite abnormal, appears to be in a degree of renal insufficiency, has an elevated white blood cell count has an elevated blood glucose.  Patient denies any history of diabetes.  Has not eaten or drinking anything with sugar prior to the lab work being performed.  Unable to calculate  anion gap added on a VBG.  VBG confirms pH of 7.0 with a low PCO2 consistent respiratory compensation.  Highly suspect new onset diabetes with diabetic ketoacidosis.  Covid test is pending which could be contributing to this as well, patient is not vaccinated but denies any fever cough.  Given these findings including the VBG we will start the patient on insulin infusion.  I have ordered 2 L of normal saline for the patient to be bolused.  Patient does have a slightly elevated lipase and LFTs which could explain her upper abdominal discomfort.  Will obtain right upper quadrant ultrasound as precaution.  Yvonne Scott was evaluated in Emergency Department on 02/20/2020 for the symptoms described in the history of present illness. She was evaluated in the context of the global COVID-19 pandemic, which necessitated consideration that the patient might be at risk for infection  with the SARS-CoV-2 virus that causes COVID-19. Institutional protocols and algorithms that pertain to the evaluation of patients at risk for COVID-19 are in a state of rapid change based on information released by regulatory bodies including the CDC and federal and state organizations. These policies and algorithms were followed during the patient's care in the ED.  CRITICAL CARE Performed by: Harvest Dark   Total critical care time: 45 minutes  Critical care time was exclusive of separately billable procedures and treating other patients.  Critical care was necessary to treat or prevent imminent or life-threatening deterioration.  Critical care was time spent personally by me on the following activities: development of treatment plan with patient and/or surrogate as well as nursing, discussions with consultants, evaluation of patient's response to treatment, examination of patient, obtaining history from patient or surrogate, ordering and performing treatments and interventions, ordering and review of laboratory studies,  ordering and review of radiographic studies, pulse oximetry and re-evaluation of patient's condition.  ____________________________________________   FINAL CLINICAL IMPRESSION(S) / ED DIAGNOSES  New onset diabetes Diabetic ketoacidosis   Harvest Dark, MD 02/21/20 [phone]

## 2020-02-21 ENCOUNTER — Emergency Department: Payer: Self-pay

## 2020-02-21 DIAGNOSIS — D72829 Elevated white blood cell count, unspecified: Secondary | ICD-10-CM

## 2020-02-21 DIAGNOSIS — E1110 Type 2 diabetes mellitus with ketoacidosis without coma: Secondary | ICD-10-CM | POA: Diagnosis present

## 2020-02-21 DIAGNOSIS — N6310 Unspecified lump in the right breast, unspecified quadrant: Secondary | ICD-10-CM

## 2020-02-21 DIAGNOSIS — N179 Acute kidney failure, unspecified: Secondary | ICD-10-CM

## 2020-02-21 DIAGNOSIS — R7989 Other specified abnormal findings of blood chemistry: Secondary | ICD-10-CM

## 2020-02-21 LAB — BLOOD GAS, VENOUS
Acid-base deficit: 20.1 mmol/L — ABNORMAL HIGH (ref 0.0–2.0)
Acid-base deficit: 8.1 mmol/L — ABNORMAL HIGH (ref 0.0–2.0)
Bicarbonate: 10.6 mmol/L — ABNORMAL LOW (ref 20.0–28.0)
Bicarbonate: 17.8 mmol/L — ABNORMAL LOW (ref 20.0–28.0)
O2 Saturation: 46.6 %
O2 Saturation: 52.9 %
Patient temperature: 37
Patient temperature: 37
pCO2, Ven: 37 mmHg — ABNORMAL LOW (ref 44.0–60.0)
pCO2, Ven: 41 mmHg — ABNORMAL LOW (ref 44.0–60.0)
pH, Ven: 7.02 — CL (ref 7.250–7.430)
pH, Ven: 7.29 (ref 7.250–7.430)
pO2, Ven: 32 mmHg (ref 32.0–45.0)
pO2, Ven: 40 mmHg (ref 32.0–45.0)

## 2020-02-21 LAB — BASIC METABOLIC PANEL
Anion gap: 16 — ABNORMAL HIGH (ref 5–15)
Anion gap: 16 — ABNORMAL HIGH (ref 5–15)
Anion gap: 15 (ref 5–15)
Anion gap: 15 (ref 5–15)
BUN: 42 mg/dL — ABNORMAL HIGH (ref 6–20)
BUN: 43 mg/dL — ABNORMAL HIGH (ref 6–20)
BUN: 42 mg/dL — ABNORMAL HIGH (ref 6–20)
BUN: 32 mg/dL — ABNORMAL HIGH (ref 6–20)
CO2: 12 mmol/L — ABNORMAL LOW (ref 22–32)
CO2: 14 mmol/L — ABNORMAL LOW (ref 22–32)
CO2: 13 mmol/L — ABNORMAL LOW (ref 22–32)
CO2: 18 mmol/L — ABNORMAL LOW (ref 22–32)
Calcium: 8.3 mg/dL — ABNORMAL LOW (ref 8.9–10.3)
Calcium: 9.1 mg/dL (ref 8.9–10.3)
Calcium: 9 mg/dL (ref 8.9–10.3)
Calcium: 9.3 mg/dL (ref 8.9–10.3)
Chloride: 108 mmol/L (ref 98–111)
Chloride: 105 mmol/L (ref 98–111)
Chloride: 108 mmol/L (ref 98–111)
Chloride: 104 mmol/L (ref 98–111)
Creatinine, Ser: 1.47 mg/dL — ABNORMAL HIGH (ref 0.44–1.00)
Creatinine, Ser: 1.37 mg/dL — ABNORMAL HIGH (ref 0.44–1.00)
Creatinine, Ser: 1.25 mg/dL — ABNORMAL HIGH (ref 0.44–1.00)
Creatinine, Ser: 0.94 mg/dL (ref 0.44–1.00)
GFR, Estimated: 41 mL/min — ABNORMAL LOW (ref >60)
GFR, Estimated: 44 mL/min — ABNORMAL LOW (ref >60)
GFR, Estimated: 49 mL/min — ABNORMAL LOW (ref >60)
GFR, Estimated: >60 mL/min (ref >60)
Glucose, Bld: 121 mg/dL — ABNORMAL HIGH (ref 70–99)
Glucose, Bld: 117 mg/dL — ABNORMAL HIGH (ref 70–99)
Glucose, Bld: 114 mg/dL — ABNORMAL HIGH (ref 70–99)
Glucose, Bld: 134 mg/dL — ABNORMAL HIGH (ref 70–99)
Potassium: 4.1 mmol/L (ref 3.5–5.1)
Potassium: 4.4 mmol/L (ref 3.5–5.1)
Potassium: 3.3 mmol/L — ABNORMAL LOW (ref 3.5–5.1)
Potassium: 3.3 mmol/L — ABNORMAL LOW (ref 3.5–5.1)
Sodium: 136 mmol/L (ref 135–145)
Sodium: 135 mmol/L (ref 135–145)
Sodium: 136 mmol/L (ref 135–145)
Sodium: 137 mmol/L (ref 135–145)

## 2020-02-21 LAB — URINALYSIS, COMPLETE (UACMP) WITH MICROSCOPIC
Bilirubin Urine: NEGATIVE
Glucose, UA: NEGATIVE mg/dL
Ketones, ur: 80 mg/dL — AB
Leukocytes,Ua: NEGATIVE
Nitrite: NEGATIVE
Protein, ur: 100 mg/dL — AB
Specific Gravity, Urine: 1.015 (ref 1.005–1.030)
pH: 5 (ref 5.0–8.0)

## 2020-02-21 LAB — CBC
HCT: 43.9 % (ref 36.0–46.0)
HCT: 35.6 % — ABNORMAL LOW (ref 36.0–46.0)
Hemoglobin: 14.5 g/dL (ref 12.0–15.0)
Hemoglobin: 12.5 g/dL (ref 12.0–15.0)
MCH: 32.5 pg (ref 26.0–34.0)
MCH: 32.6 pg (ref 26.0–34.0)
MCHC: 33 g/dL (ref 30.0–36.0)
MCHC: 35.1 g/dL (ref 30.0–36.0)
MCV: 98.4 fL (ref 80.0–100.0)
MCV: 92.7 fL (ref 80.0–100.0)
Platelets: 211 10*3/uL (ref 150–400)
Platelets: 149 10*3/uL — ABNORMAL LOW (ref 150–400)
RBC: 4.46 MIL/uL (ref 3.87–5.11)
RBC: 3.84 MIL/uL — ABNORMAL LOW (ref 3.87–5.11)
RDW: 15.1 % (ref 11.5–15.5)
RDW: 15 % (ref 11.5–15.5)
WBC: 11.6 10*3/uL — ABNORMAL HIGH (ref 4.0–10.5)
WBC: 8.7 10*3/uL (ref 4.0–10.5)
nRBC: 0 % (ref 0.0–0.2)
nRBC: 0 % (ref 0.0–0.2)

## 2020-02-21 LAB — URINE DRUG SCREEN, QUALITATIVE (ARMC ONLY)
Amphetamines, Ur Screen: NOT DETECTED
Barbiturates, Ur Screen: NOT DETECTED
Benzodiazepine, Ur Scrn: NOT DETECTED
Cannabinoid 50 Ng, Ur ~~LOC~~: NOT DETECTED
Cocaine Metabolite,Ur ~~LOC~~: NOT DETECTED
MDMA (Ecstasy)Ur Screen: NOT DETECTED
Methadone Scn, Ur: NOT DETECTED
Opiate, Ur Screen: NOT DETECTED
Phencyclidine (PCP) Ur S: NOT DETECTED
Tricyclic, Ur Screen: NOT DETECTED

## 2020-02-21 LAB — CBG MONITORING, ED
Glucose-Capillary: 108 mg/dL — ABNORMAL HIGH (ref 70–99)
Glucose-Capillary: 113 mg/dL — ABNORMAL HIGH (ref 70–99)
Glucose-Capillary: 116 mg/dL — ABNORMAL HIGH (ref 70–99)
Glucose-Capillary: 117 mg/dL — ABNORMAL HIGH (ref 70–99)
Glucose-Capillary: 135 mg/dL — ABNORMAL HIGH (ref 70–99)
Glucose-Capillary: 148 mg/dL — ABNORMAL HIGH (ref 70–99)
Glucose-Capillary: 217 mg/dL — ABNORMAL HIGH (ref 70–99)

## 2020-02-21 LAB — RESP PANEL BY RT-PCR (FLU A&B, COVID) ARPGX2
Influenza A by PCR: NEGATIVE
Influenza B by PCR: NEGATIVE
SARS Coronavirus 2 by RT PCR: NEGATIVE

## 2020-02-21 LAB — MAGNESIUM
Magnesium: 1.6 mg/dL — ABNORMAL LOW (ref 1.7–2.4)
Magnesium: 3.3 mg/dL — ABNORMAL HIGH (ref 1.7–2.4)

## 2020-02-21 LAB — HEMOGLOBIN A1C
Hgb A1c MFr Bld: 4.8 % (ref 4.8–5.6)
Mean Plasma Glucose: 91.06 mg/dL

## 2020-02-21 LAB — ETHANOL: Alcohol, Ethyl (B): <10 mg/dL (ref <10)

## 2020-02-21 LAB — PHOSPHORUS
Phosphorus: <1 mg/dL — CL (ref 2.5–4.6)
Phosphorus: <1 mg/dL — CL (ref 2.5–4.6)

## 2020-02-21 LAB — HIV ANTIBODY (ROUTINE TESTING W REFLEX): HIV Screen 4th Generation wRfx: NONREACTIVE

## 2020-02-21 LAB — CREATININE, SERUM
Creatinine, Ser: 2.39 mg/dL — ABNORMAL HIGH (ref 0.44–1.00)
GFR, Estimated: 23 mL/min — ABNORMAL LOW (ref >60)

## 2020-02-21 LAB — BETA-HYDROXYBUTYRIC ACID: Beta-Hydroxybutyric Acid: 4.43 mmol/L — ABNORMAL HIGH (ref 0.05–0.27)

## 2020-02-21 MED ORDER — LACTATED RINGERS IV SOLN: INTRAVENOUS | Status: DC

## 2020-02-21 MED ORDER — FAMOTIDINE 20 MG PO TABS
20.0000 mg | ORAL_TABLET | Freq: Once | ORAL | Status: AC
  Administered 2020-02-21: 20 mg via ORAL
  Filled 2020-02-21: qty 1

## 2020-02-21 MED ORDER — THIAMINE HCL 100 MG PO TABS
100.0000 mg | ORAL_TABLET | Freq: Every day | ORAL | Status: DC
  Administered 2020-02-25 – 2020-02-26 (×2): 100 mg via ORAL
  Filled 2020-02-21 (×2): qty 1

## 2020-02-21 MED ORDER — MAGNESIUM SULFATE 4 GM/100ML IV SOLN
4.0000 g | Freq: Once | INTRAVENOUS | Status: AC
  Administered 2020-02-21: 4 g via INTRAVENOUS
  Filled 2020-02-21: qty 100

## 2020-02-21 MED ORDER — POTASSIUM CHLORIDE 10 MEQ/100ML IV SOLN
10.0000 meq | INTRAVENOUS | Status: AC
  Filled 2020-02-21: qty 100

## 2020-02-21 MED ORDER — DEXTROSE IN LACTATED RINGERS 5 % IV SOLN: INTRAVENOUS | Status: DC

## 2020-02-21 MED ORDER — POTASSIUM CHLORIDE 10 MEQ/100ML IV SOLN
10.0000 meq | INTRAVENOUS | Status: AC
  Administered 2020-02-21 (×2): 10 meq via INTRAVENOUS
  Filled 2020-02-21: qty 100

## 2020-02-21 MED ORDER — INSULIN REGULAR(HUMAN) IN NACL 100-0.9 UT/100ML-% IV SOLN: INTRAVENOUS | Status: DC

## 2020-02-21 MED ORDER — THIAMINE HCL 100 MG/ML IJ SOLN
100.0000 mg | Freq: Every day | INTRAMUSCULAR | Status: DC
  Administered 2020-02-22 – 2020-02-23 (×2): 100 mg via INTRAVENOUS
  Filled 2020-02-21 (×2): qty 2

## 2020-02-21 MED ORDER — LORAZEPAM 1 MG PO TABS
1.0000 mg | ORAL_TABLET | ORAL | Status: AC | PRN
  Administered 2020-02-22: 1 mg via ORAL
  Filled 2020-02-21: qty 1

## 2020-02-21 MED ORDER — ADULT MULTIVITAMIN W/MINERALS CH
1.0000 | ORAL_TABLET | Freq: Every day | ORAL | Status: DC
  Administered 2020-02-25 – 2020-02-26 (×2): 1 via ORAL
  Filled 2020-02-21 (×2): qty 1

## 2020-02-21 MED ORDER — ENOXAPARIN SODIUM 40 MG/0.4ML ~~LOC~~ SOLN
40.0000 mg | SUBCUTANEOUS | Status: DC
  Filled 2020-02-21: qty 0.4

## 2020-02-21 MED ORDER — SODIUM BICARBONATE 8.4 % IV SOLN
INTRAVENOUS | Status: DC
  Filled 2020-02-21 (×4): qty 100

## 2020-02-21 MED ORDER — POTASSIUM & SODIUM PHOSPHATES 280-160-250 MG PO PACK
2.0000 | PACK | Freq: Three times a day (TID) | ORAL | Status: AC
  Administered 2020-02-21: 2 via ORAL
  Filled 2020-02-21 (×8): qty 2

## 2020-02-21 MED ORDER — LORAZEPAM 2 MG/ML IJ SOLN
1.0000 mg | INTRAMUSCULAR | Status: AC | PRN
  Administered 2020-02-21 (×2): 2 mg via INTRAVENOUS
  Administered 2020-02-22: 1 mg via INTRAVENOUS
  Administered 2020-02-22: 2 mg via INTRAVENOUS
  Administered 2020-02-22: 1 mg via INTRAVENOUS
  Filled 2020-02-21 (×5): qty 1

## 2020-02-21 MED ORDER — FOLIC ACID 1 MG PO TABS
1.0000 mg | ORAL_TABLET | Freq: Every day | ORAL | Status: DC
  Administered 2020-02-25 – 2020-02-26 (×2): 1 mg via ORAL
  Filled 2020-02-21 (×2): qty 1

## 2020-02-21 MED ORDER — DEXTROSE 50 % IV SOLN: 0.0000 mL | INTRAVENOUS | Status: DC | PRN

## 2020-02-21 MED ORDER — LACTATED RINGERS IV BOLUS: 20.0000 mL/kg | Freq: Once | INTRAVENOUS | Status: DC

## 2020-02-21 MED ORDER — POTASSIUM PHOSPHATES 15 MMOLE/5ML IV SOLN
30.0000 mmol | Freq: Once | INTRAVENOUS | Status: AC
  Administered 2020-02-21: 30 mmol via INTRAVENOUS
  Filled 2020-02-21: qty 10

## 2020-02-21 MED ORDER — INSULIN REGULAR(HUMAN) IN NACL 100-0.9 UT/100ML-% IV SOLN
INTRAVENOUS | Status: DC
  Administered 2020-02-21: 1.6 [IU]/h via INTRAVENOUS

## 2020-02-21 NOTE — ED Notes (Signed)
Fall mats placed on bilateral sides of bed.

## 2020-02-21 NOTE — ED Notes (Signed)
Patient has made multiple attempts to get up from the bed. After numerous unsuccessful attempts to keep patient sitting and oriented, sitter was requested by charge nurse.

## 2020-02-21 NOTE — ED Notes (Signed)
Patient resting, NAD noted.

## 2020-02-21 NOTE — TOC Initial Note (Signed)
Transition of Care [facility]) - Initial/Assessment Note    Patient Details  Name: Yvonne Scott MRN: [id number] Date of Birth: March 22, 1960  Transition of Care [facility]) CM/SW Contact:    Ova Freshwater Phone Number: [phone] 02/21/2020, 4:26 PM  Clinical Narrative:                  Patient would not wake up to [place] calling her name.  TOC will continue to follow.       Patient Goals and CMS Choice        Expected Discharge Plan and Services                                                Prior Living Arrangements/Services                       Activities of Daily Living      Permission Sought/Granted                  Emotional Assessment              Admission diagnosis:  DKA (diabetic ketoacidosis) ([place]) [E11.10] Patient Active Problem List   Diagnosis Date Noted  . DKA (diabetic ketoacidosis) ([place]) 02/21/2020  . Elevated LFTs 02/21/2020  . AKI (acute kidney injury) ([place]) 02/21/2020  . Leukocytosis 02/21/2020  . Lump of right breast 02/21/2020  . Hyponatremia 01/30/2017  . Hypochloremia 01/30/2017  . Hypokalemia 01/27/2017  . Hyperglycemia 01/27/2017  . Back pain 12/15/2016  . Chronic lower back pain 12/15/2016  . Elevated ferritin 11/07/2016  . Aortic atherosclerosis ([place]) 10/24/2016  . Fatty liver 10/24/2016  . Medication monitoring encounter 11/19/2015  . GERD (gastroesophageal reflux disease) 11/19/2015  . Tobacco abuse 11/19/2015  . Essential hypertension, benign 11/19/2015   PCP:  Towanda Malkin, MD Pharmacy:   [facility] [place] [facility] [address] [place]) [place] [address] Phone: [phone] Fax: [phone]     Social Determinants of Health (SDOH) Interventions    Readmission Risk Interventions No flowsheet data found.

## 2020-02-21 NOTE — H&P (Signed)
History and Physical    Yvonne Scott GBT:[id number] DOB: 1960-11-17 DOA: 02/20/2020  PCP: Towanda Malkin, MD   Patient coming from: Home  I have personally briefly reviewed patient's old medical records in [place]  Chief Complaint: Nausea, vomiting and shortness of breath x1 day  HPI: Yvonne Scott is a 59 y.o. female with medical history significant for hypertension who presents to the ER with a 1 day complaint of nausea vomiting, shortness of breath and generalized weakness.  Has mild upper abdominal pain, denies diarrhea, fever or chills.  Emesis is nonbilious nonbloody and without coffee grounds.  Denies dysuria.  Denies cough or chest pain. Patient states that for some time now which she is unable to clarify she has been feeling weak and thought it was because of stress. She mentions that she has a lump in her right breast which she has had for several years but lately it became painful. She denies fevers at home. ED Course: On arrival, afebrile, BP 119/89, pulse 105, tachypneic with respirations 24 with O2 sat 100% on room air.  Blood work significant for WBC of15800, hemoglobin 15.8, glucose 268, anion gap unable to calculate.  Creatinine 1.7 and elevation of liver enzymes with AST 57, ALT 75 total bilirubin 2.7.  Lipase 71. venous pH 7 EKG as reviewed by me : Sinus tachycardia at 108 with no acute ST-T wave changes Imaging: Chest x-ray clear.  RUQ sono preliminary read with no acute findings Patient given IV fluid bolus and started on an insulin infusion.  Hospitalist consulted for admission.  Review of Systems: As per HPI otherwise all other systems on review of systems negative.    Past Medical History:  Diagnosis Date  . Anxiety   . Aortic atherosclerosis ([place]) 10/24/2016  . GERD (gastroesophageal reflux disease)     History reviewed. No pertinent surgical history.   reports that she has been smoking cigarettes. She started smoking about 40 years ago.  She has a 36.00 pack-year smoking history. She has never used smokeless tobacco. She reports that she does not drink alcohol and does not use drugs.  No Known Allergies  Family History  Problem Relation Age of Onset  . Cancer Mother 31       breast  . Head & neck cancer Father   . Cancer Father        melonma  . Cirrhosis Maternal Grandfather   . Cancer Paternal Grandmother        skin/breast     Prior to Admission medications   Medication Sig Start Date End Date Taking? Authorizing Provider  albuterol (VENTOLIN HFA) 108 (90 Base) MCG/ACT inhaler INHALE 2 PUFFS BY MOUTH EVERY 6 HOURS AS NEEDED FOR WHEEZING OR SHORTNESS OF BREATH 11/05/18   Poulose, Bethel Born, NP  amoxicillin-clavulanate (AUGMENTIN) 500-125 MG tablet Take 1 tablet (500 mg total) by mouth 3 (three) times daily. 04/24/18   Poulose, Bethel Born, NP  fluticasone (FLONASE) 50 MCG/ACT nasal spray Place 1 spray into both nostrils daily. 10/18/16   Gladstone Lighter, MD  guaiFENesin (MUCINEX) 600 MG 12 hr tablet Take 2 tablets (1,200 mg total) by mouth 2 (two) times daily. 10/17/16   Gladstone Lighter, MD  Magnesium 250 MG TABS Take 250 mg by mouth daily.    [provider]  Potassium 99 MG TABS Take 99 mg by mouth daily.    [provider]  vitamin B-12 (CYANOCOBALAMIN) 1000 MCG tablet Take 1,000 mcg by mouth daily.  [provider]    Physical Exam: Vitals:   02/21/20 0000 02/21/20 0015 02/21/20 0100 02/21/20 0135  BP: 107/77  119/89   Pulse: (!) 104 (!) 101 (!) 105   Resp:  (!) 23 (!) 24   Temp:    97.6 F (36.4 C)  TempSrc:    Oral  SpO2: 100% 100% 100%   Weight:      Height:         Vitals:   02/21/20 0000 02/21/20 0015 02/21/20 0100 02/21/20 0135  BP: 107/77  119/89   Pulse: (!) 104 (!) 101 (!) 105   Resp:  (!) 23 (!) 24   Temp:    97.6 F (36.4 C)  TempSrc:    Oral  SpO2: 100% 100% 100%   Weight:      Height:          Constitutional:  Lethargic and oriented x 3 . Mild  dyspnea  HEENT:      Head: Normocephalic and atraumatic.         Eyes: PERLA, EOMI, Conjunctivae are normal. Sclera is non-icteric.       Mouth/Throat: Mucous membranes are moist.       Neck: Supple with no signs of meningismus. Cardiovascular: Regular rate and rhythm. No murmurs, gallops, or rubs. 2+ symmetrical distal pulses are present . No JVD. No LE edema Respiratory: Respiratory effort increased with tachypnea.Lungs sounds clear bilaterally. No wheezes, crackles, or rhonchi.  Breast exam: Lump felt upper outer quadrant of right breast, tender on palpation, mild overlying erythema, no rash Gastrointestinal: Soft, non tender, and non distended with positive bowel sounds. No rebound or guarding. Genitourinary: No CVA tenderness. Musculoskeletal: Nontender with normal range of motion in all extremities. No cyanosis, or erythema of extremities. Neurologic:  Face is symmetric. Moving all extremities. No gross focal neurologic deficits . Skin: Skin is warm, dry.  No rash or ulcers Psychiatric: Mood and affect are normal    Labs on Admission: I have personally reviewed following labs and imaging studies  CBC: Recent Labs  Lab 02/20/20 1408  WBC 15.8*  HGB 15.8*  HCT 49.3*  MCV 101.0*  PLT 509   Basic Metabolic Panel: Recent Labs  Lab 02/20/20 1408  NA 139  K 4.2  CL 98  CO2 <7*  GLUCOSE 268*  BUN 18  CREATININE 1.70*  CALCIUM 9.7   GFR: Estimated Creatinine Clearance: 30.2 mL/min (A) (by C-G formula based on SCr of 1.7 mg/dL (H)). Liver Function Tests: Recent Labs  Lab 02/20/20 1408  AST 57*  ALT 75*  ALKPHOS 90  BILITOT 2.7*  PROT 9.3*  ALBUMIN 5.5*   Recent Labs  Lab 02/20/20 1408  LIPASE 71*   No results for input(s): AMMONIA in the last 168 hours. Coagulation Profile: No results for input(s): INR, PROTIME in the last 168 hours. Cardiac Enzymes: No results for input(s): CKTOTAL, CKMB, CKMBINDEX, TROPONINI in the last 168 hours. BNP (last 3  results) No results for input(s): PROBNP in the last 8760 hours. HbA1C: No results for input(s): HGBA1C in the last 72 hours. CBG: Recent Labs  Lab 02/21/20 0034  GLUCAP 217*   Lipid Profile: No results for input(s): CHOL, HDL, LDLCALC, TRIG, CHOLHDL, LDLDIRECT in the last 72 hours. Thyroid Function Tests: No results for input(s): TSH, T4TOTAL, FREET4, T3FREE, THYROIDAB in the last 72 hours. Anemia Panel: No results for input(s): VITAMINB12, FOLATE, FERRITIN, TIBC, IRON, RETICCTPCT in the last 72 hours. Urine analysis:    Component Value  Date/Time   COLORURINE YELLOW (A) 10/16/2016 0634   APPEARANCEUR CLEAR (A) 10/16/2016 0634   APPEARANCEUR Hazy 02/16/2014 0724   LABSPEC 1.003 (L) 10/16/2016 0634   LABSPEC 1.014 02/16/2014 0724   PHURINE 7.0 10/16/2016 0634   GLUCOSEU NEGATIVE 10/16/2016 0634   GLUCOSEU Negative 02/16/2014 0724   HGBUR NEGATIVE 10/16/2016 0634   BILIRUBINUR NEGATIVE 10/16/2016 0634   BILIRUBINUR Negative 02/16/2014 0724   [place] 10/16/2016 0634   PROTEINUR NEGATIVE 10/16/2016 0634   NITRITE NEGATIVE 10/16/2016 0634   LEUKOCYTESUR NEGATIVE 10/16/2016 0634   LEUKOCYTESUR Negative 02/16/2014 0724    Radiological Exams on Admission: DG Chest 2 View  Result Date: 02/20/2020 CLINICAL DATA:  SOB EXAM: CHEST - 2 VIEW COMPARISON:  12/15/2019 and prior. FINDINGS: No focal consolidation. No pneumothorax or pleural effusion. Cardiomediastinal silhouette is within normal limits. No acute osseous abnormality. IMPRESSION: No focal airspace disease. Electronically Signed   By: Primitivo Gauze M.D.   On: 02/20/2020 15:11     Assessment/Plan 59 year old female with history of hypertension presenting with a 1 day complaint of nausea vomiting, shortness of breath and generalized weakness.      DKA, new onset diabetes (diabetic ketoacidosis) (HCC) -Nausea vomiting and weakness with blood sugar 268, venous pH of 7, bicarb less than 7, anion gap not  calculated -Follow beta hydroxybutyric acid -IV hydration per protocol -IV insulin -Follow A1c  Nausea and vomiting -Possibly all related to DKA but some concern for gallbladder disease in view of normal LFTs still preliminary sonogram but no acute finding -IV hydration, IV antiemetics -Clear liquid diet advance as tolerated -IV Protonix    Elevated LFTs and lipase -Right upper quadrant sonogram, preliminary read unremarkable -Follow-up official sonogram report and follow LFTs -History of fatty liver seen on records    AKI (acute kidney injury) ([place]) -Creatinine 1.70 up from baseline of 0.58 -IV hydration, monitor renal function avoid nephrotoxins    Leukocytosis -Possibly reactive. -Monitor close for infection. -Will hold off on antibiotics for now  Painful lump right breast -Patient has known lump of right breast for several years recently became tender to palpation over the past few days -Consider surgical evaluation in the a.m. -Patient was seen in the ER on 10/4 -Given leukocytosis, consider empiric antibiotics if uptrending    Essential hypertension, benign -Labetalol as needed    DVT prophylaxis: Lovenox  Code Status: full code  Family Communication:  none  Disposition Plan: Back to previous home environment Consults called: none  Status:At the time of admission, it appears that the appropriate admission status for this patient is INPATIENT. This is judged to be reasonable and necessary in order to provide the required intensity of service to ensure the patient's safety given the presenting symptoms, physical exam findings, and initial radiographic and laboratory data in the context of their  Comorbid conditions.   Patient requires inpatient status due to high intensity of service, high risk for further deterioration and high frequency of surveillance required.   I certify that at the point of admission it is my clinical judgment that the patient will require  inpatient hospital care spanning beyond [place] MD [facility]     02/21/2020, 2:11 AM

## 2020-02-21 NOTE — ED Notes (Signed)
Unsuccessful IV stick in attempting to start new IV. All IV's temporarily paused until new IV is established. Consult put in for IV team.

## 2020-02-21 NOTE — ED Notes (Signed)
Ultrasound at bedside at this time.

## 2020-02-21 NOTE — ED Notes (Signed)
Lab contacted to collect bloodwork

## 2020-02-21 NOTE — ED Notes (Signed)
Date and time results received: 02/21/20 1350 Test: Phosphorus Critical Value: <1  Name of Provider Notified: Irene Pap DO notified MD to follow up.

## 2020-02-21 NOTE — ED Notes (Signed)
Patient has made multiple attempts to get up from bed. Attempting to reorient patient and explain use of call bell. Patient still confused, removing lines and leads.

## 2020-02-21 NOTE — ED Notes (Signed)
Patient sleeping peacefully free from sign of distress. Breathing unlabored with symmetric chest rise and fall. Bed low and locked with fall pads on either side. Cardiac monitor in place as well as pure wick. Call bell in reach and pt in direct view of nurses station.

## 2020-02-21 NOTE — Progress Notes (Signed)
PROGRESS NOTE  MARCH JOOS JOI:[id number] DOB: Apr 25, 1960 DOA: 02/20/2020 PCP: Towanda Malkin, MD  HPI/Recap of past 24 hours:  NILZA EAKER is a 59 y.o. female with medical history significant for hypertension who presents to the ER with a 1 day complaint of nausea vomiting, shortness of breath and generalized weakness.  Has mild upper abdominal pain, denies diarrhea, fever or chills.  Emesis is nonbilious nonbloody and without coffee grounds.  Denies dysuria.  Denies cough or chest pain. Patient states that for some time now which she is unable to clarify she has been feeling weak and thought it was because of stress. She mentions that she has a lump in her right breast which she has had for several years but lately it became painful. She denies fevers at home. ED Course: On arrival, afebrile, BP 119/89, pulse 105, tachypneic with respirations 24 with O2 sat 100% on room air.  Blood work significant for WBC of15800, hemoglobin 15.8, glucose 268, anion gap unable to calculate.  Creatinine 1.7 and elevation of liver enzymes with AST 57, ALT 75 total bilirubin 2.7.  Lipase 71. venous pH 7 EKG as reviewed by me : Sinus tachycardia at 108 with no acute ST-T wave changes Imaging: Chest x-ray clear.  RUQ sono preliminary read with no acute findings Patient given IV fluid bolus and started on an insulin infusion.  Hospitalist consulted for admission.  02/21/20: Patient was seen and examined at bedside.  She is alert and confused.  Reports nausea.  She states she lost a good friend on Sunday, 5 days prior to this presentation.  No prior history of diabetes.  She states she has had "the sugar" in the past with no diagnosis for diabetes.  Assessment/Plan: Principal Problem:   DKA (diabetic ketoacidosis) ([place]) Active Problems:   GERD (gastroesophageal reflux disease)   Essential hypertension, benign   Elevated LFTs   AKI (acute kidney injury) ([place])   Leukocytosis   Lump of right  breast   Starvation ketoacidosis Initially presented with nausea vomiting weakness with elevated sugar in the 200s venous pH of 7, serum bicarb less than 7 and large anion gap.  A1c 4.5 with no prior history of diabetes She was treated with IV insulin and IV hydration Patient has a history of acetone ingestion Obtaining acetone levels and toxicology  Acute metabolic encephalopathy, unclear etiology at this point Follow acetone level result Concern for alcohol withdrawal Reorient as needed Delirium precautions  Alcohol use disorder with concern for alcohol withdrawal Patient has had visual hallucinations while in the ED Placed on CIWA protocol Delirium precautions  AKI Baseline creatinine 0.5 with GFR greater than 60 Presented with creatinine greater than 2.3 with GFR 23 Upon nephrotoxins, dehydration and hypotension Creatinine is downtrending Monitor urine output Repeat BMP in the morning  Severe hypophosphatemia in the setting of starvation Serum phosphorus less than 1.0 Repleted intravenously and orally Repeat level in the morning  Hypomagnesemia Serum magnesium 1.6 Repleted with IV magnesium 4 g once Repeat level in the morning  Significant electrolyte abnormalities Serum potassium 3.3, repleted intravenously and orally  Nausea and vomiting Provide antiemetics Clear liquid diet as tolerated     Code Status: Full code  Family Communication: None at bedside  Disposition Plan: Likely DC to home   Consultants:  None  Procedures:  None  Antimicrobials:  None  DVT prophylaxis: Subcu Lovenox daily  Status is: Inpatient    Dispo:  Patient From: Home  Planned Disposition: Home  Expected discharge date: 02/23/2020  Medically stable for discharge: No, ongoing management of starvation ketoacidosis and significant electrolyte abnormality.         Objective: Vitals:   02/21/20 1200 02/21/20 1215 02/21/20 1230 02/21/20 1253  BP: (!) 155/86   (!) 147/94 126/70  Pulse: (!) 102 99  85  Resp: (!) 23 18 (!) 31   Temp:      TempSrc:      SpO2: 100% 100%    Weight:      Height:        Intake/Output Summary (Last 24 hours) at 02/21/2020 1415 Last data filed at 02/21/2020 0433 Gross per 24 hour  Intake 2200 ml  Output --  Net 2200 ml   Filed Weights   02/20/20 1403  Weight: 54.4 kg    Exam:  . General: 59 y.o. year-old female well developed well nourished in no acute distress.  Alert and confused. . Cardiovascular: Regular rate and rhythm with no rubs or gallops.  No thyromegaly or JVD noted.   Marland Kitchen Respiratory: Clear to auscultation with no wheezes or rales. Good inspiratory effort. . Abdomen: Soft nontender nondistended with normal bowel sounds x4 quadrants. . Musculoskeletal: No lower extremity edema. 2/4 pulses in all 4 extremities. . Skin: No ulcerative lesions noted or rashes, . Psychiatry: Mood is appropriate for condition and setting   Data Reviewed: CBC: Recent Labs  Lab 02/20/20 1408 02/21/20 0209 02/21/20 1308  WBC 15.8* 11.6* 8.7  HGB 15.8* 14.5 12.5  HCT 49.3* 43.9 35.6*  MCV 101.0* 98.4 92.7  PLT 336 211 466*   Basic Metabolic Panel: Recent Labs  Lab 02/20/20 1408 02/21/20 0209 02/21/20 0603 02/21/20 0953 02/21/20 1308  NA 139  --  136 135 136  K 4.2  --  4.1 4.4 3.3*  CL 98  --  108 105 108  CO2 <7*  --  12* 14* 13*  GLUCOSE 268*  --  121* 117* 114*  BUN 18  --  42* 43* 42*  CREATININE 1.70* 2.39* 1.47* 1.37* 1.25*  CALCIUM 9.7  --  8.3* 9.1 9.0  MG  --   --   --   --  1.6*  PHOS  --   --   --   --  <1.0*   GFR: Estimated Creatinine Clearance: 41.1 mL/min (A) (by C-G formula based on SCr of 1.25 mg/dL (H)). Liver Function Tests: Recent Labs  Lab 02/20/20 1408  AST 57*  ALT 75*  ALKPHOS 90  BILITOT 2.7*  PROT 9.3*  ALBUMIN 5.5*   Recent Labs  Lab 02/20/20 1408  LIPASE 71*   No results for input(s): AMMONIA in the last 168 hours. Coagulation Profile: No results for  input(s): INR, PROTIME in the last 168 hours. Cardiac Enzymes: No results for input(s): CKTOTAL, CKMB, CKMBINDEX, TROPONINI in the last 168 hours. BNP (last 3 results) No results for input(s): PROBNP in the last 8760 hours. HbA1C: Recent Labs    02/21/20 0209  HGBA1C 4.8   CBG: Recent Labs  Lab 02/21/20 0432 02/21/20 0604 02/21/20 0643 02/21/20 0924 02/21/20 1045  GLUCAP 116* 117* 135* 108* 113*   Lipid Profile: No results for input(s): CHOL, HDL, LDLCALC, TRIG, CHOLHDL, LDLDIRECT in the last 72 hours. Thyroid Function Tests: No results for input(s): TSH, T4TOTAL, FREET4, T3FREE, THYROIDAB in the last 72 hours. Anemia Panel: No results for input(s): VITAMINB12, FOLATE, FERRITIN, TIBC, IRON, RETICCTPCT in the last 72 hours. Urine analysis:    Component Value Date/Time  COLORURINE YELLOW (A) 02/21/2020 0641   APPEARANCEUR HAZY (A) 02/21/2020 0641   APPEARANCEUR Hazy 02/16/2014 0724   LABSPEC 1.015 02/21/2020 0641   LABSPEC 1.014 02/16/2014 0724   PHURINE 5.0 02/21/2020 0641   GLUCOSEU NEGATIVE 02/21/2020 0641   GLUCOSEU Negative 02/16/2014 0724   HGBUR LARGE (A) 02/21/2020 0641   BILIRUBINUR NEGATIVE 02/21/2020 0641   BILIRUBINUR Negative 02/16/2014 0724   KETONESUR 80 (A) 02/21/2020 0641   PROTEINUR 100 (A) 02/21/2020 0641   NITRITE NEGATIVE 02/21/2020 0641   LEUKOCYTESUR NEGATIVE 02/21/2020 0641   LEUKOCYTESUR Negative 02/16/2014 0724   Sepsis Labs: @LABRCNTIP (procalcitonin:4,lacticidven:4)  ) Recent Results (from the past 240 hour(s))  Resp Panel by RT-PCR (Flu A&B, Covid) Nasopharyngeal Swab     Status: None   Collection Time: 02/21/20 12:30 AM   Specimen: Nasopharyngeal Swab; Nasopharyngeal(NP) swabs in vial transport medium  Result Value Ref Range Status   SARS Coronavirus 2 by RT PCR NEGATIVE NEGATIVE Final    Comment: (NOTE) SARS-CoV-2 target nucleic acids are NOT DETECTED.  The SARS-CoV-2 RNA is generally detectable in upper respiratory specimens  during the acute phase of infection. The lowest concentration of SARS-CoV-2 viral copies this assay can detect is 138 copies/mL. A negative result does not preclude SARS-Cov-2 infection and should not be used as the sole basis for treatment or other patient management decisions. A negative result may occur with  improper specimen collection/handling, submission of specimen other than nasopharyngeal swab, presence of viral mutation(s) within the areas targeted by this assay, and inadequate number of viral copies(<138 copies/mL). A negative result must be combined with clinical observations, patient history, and epidemiological information. The expected result is Negative.  Fact Sheet for Patients:  EntrepreneurPulse.com.au  Fact Sheet for Healthcare Providers:  IncredibleEmployment.be  This test is no t yet approved or cleared by the [place] FDA and  has been authorized for detection and/or diagnosis of SARS-CoV-2 by FDA under an Emergency Use Authorization (EUA). This EUA will remain  in effect (meaning this test can be used) for the duration of the COVID-19 declaration under Section 564(b)(1) of the Act, 21 U.S.C.section 360bbb-3(b)(1), unless the authorization is terminated  or revoked sooner.       Influenza A by PCR NEGATIVE NEGATIVE Final   Influenza B by PCR NEGATIVE NEGATIVE Final    Comment: (NOTE) The Xpert Xpress SARS-CoV-2/FLU/RSV plus assay is intended as an aid in the diagnosis of influenza from Nasopharyngeal swab specimens and should not be used as a sole basis for treatment. Nasal washings and aspirates are unacceptable for Xpert Xpress SARS-CoV-2/FLU/RSV testing.  Fact Sheet for Patients: EntrepreneurPulse.com.au  Fact Sheet for Healthcare Providers: IncredibleEmployment.be  This test is not yet approved or cleared by the [place] FDA and has been authorized for detection  and/or diagnosis of SARS-CoV-2 by FDA under an Emergency Use Authorization (EUA). This EUA will remain in effect (meaning this test can be used) for the duration of the COVID-19 declaration under Section 564(b)(1) of the Act, 21 U.S.C. section 360bbb-3(b)(1), unless the authorization is terminated or revoked.  Performed at [facility], [address] [place], [place] [address]       Studies: DG Chest 2 View  Result Date: 02/20/2020 CLINICAL DATA:  SOB EXAM: CHEST - 2 VIEW COMPARISON:  12/15/2019 and prior. FINDINGS: No focal consolidation. No pneumothorax or pleural effusion. Cardiomediastinal silhouette is within normal limits. No acute osseous abnormality. IMPRESSION: No focal airspace disease. Electronically Signed   By: Milus Mallick.D.  On: 02/20/2020 15:11   US ABDOMEN LIMITED RUQ (LIVER/GB)  Result Date: 02/21/2020 CLINICAL DATA:  Upper abdominal pain EXAM: ULTRASOUND ABDOMEN LIMITED RIGHT UPPER QUADRANT COMPARISON:  None. FINDINGS: Gallbladder: No gallstones or wall thickening visualized. No sonographic Murphy sign noted by sonographer. Common bile duct: Diameter: 6 mm Liver: Increased echotexture seen throughout. No focal abnormality or biliary ductal dilatation. Portal vein is patent on color Doppler imaging with normal direction of blood flow towards the liver. Other: None. IMPRESSION: Mild hepatic steatosis.  Normal appearing gallbladder Electronically Signed   By: Prudencio Pair M.D.   On: 02/21/2020 02:21    Scheduled Meds: . albuterol  2.5 mg Nebulization Once  . enoxaparin (LOVENOX) injection  40 mg Subcutaneous Q24H  . folic acid  1 mg Oral Daily  . multivitamin with minerals  1 tablet Oral Daily  . potassium & sodium phosphates  2 packet Oral TID AC & HS  . thiamine  100 mg Oral Daily   Or  . thiamine  100 mg Intravenous Daily    Continuous Infusions: . dextrose 5% lactated ringers 125 mL/hr at 02/21/20 1042  . dextrose 5% lactated ringers     . lactated ringers    . lactated ringers    . lactated ringers    . magnesium sulfate bolus IVPB    . potassium PHOSPHATE IVPB (in mmol)       LOS: 0 days     Kayleen Memos, MD [facility] Pager [phone]  If 7PM-7AM, please contact night-coverage [email or web address] Password [email or web address] 02/21/2020, 2:15 PM

## 2020-02-21 NOTE — ED Notes (Signed)
Lab contacted to do blood draws

## 2020-02-21 NOTE — ED Notes (Signed)
This nurse heard loud noise from patient room. Patient had attempted to get out of bed, by going over side rail, and fell onto floor. Patient was alert and in no acute distress. IV in left arm had been pulled out. Blood present on hand from IV removal. Patient cleaned up and assessed by provider. Patient denies any pain, states she needed to go to restroom, and she "wanted to see if she could do it" (get out of bed). Reoriented patient and advised her that she has a purewick in place, and to utilize the call bell.  Upon assessment call bell was at side of patient prior to fall. She had not been placed on bed alarm on previous shift. Bed alarm now placed under patient and fall alert bracelet and socks placed on patient.

## 2020-02-21 NOTE — ED Notes (Signed)
MD messaged in reguard to CBG 108. Endo tool recommends discontinuation of drip. MD to follow up.

## 2020-02-21 NOTE — Progress Notes (Signed)
Inpatient Diabetes Program Recommendations  AACE/ADA: New Consensus Statement on Inpatient Glycemic Control (2015)  Target Ranges:  Prepandial:   less than 140 mg/dL      Peak postprandial:   less than 180 mg/dL (1-2 hours)      Critically ill patients:  140 - 180 mg/dL   Lab Results  Component Value Date   GLUCAP 113 (H) 02/21/2020   HGBA1C 4.8 02/21/2020    Review of Glycemic Control Results for Yvonne Scott, Yvonne Scott (MRN [id number]) as of 02/21/2020 11:52  Ref. Range 02/21/2020 06:04 02/21/2020 06:43 02/21/2020 09:24 02/21/2020 10:45  Glucose-Capillary Latest Ref Range: 70 - 99 mg/dL 117 (H) 135 (H) 108 (H) 113 (H)   None-A1C Current orders for Inpatient glycemic control:  IV insulin Inpatient Diabetes Program Recommendations:    Note patient with AG acidosis, however A1C came back at 4.8%.  It does not appear that patient has diabetes and therefore acidosis likely not related to blood sugars.  Discussed with RN and MD.  Patient is confused and possibly withdrawing from alcohol.   Will sign off referral.   Thanks  Adah Perl, RN, BC-ADM Inpatient Diabetes Coordinator Pager [phone] (8a-5p)

## 2020-02-22 ENCOUNTER — Inpatient Hospital Stay: Payer: Self-pay

## 2020-02-22 DIAGNOSIS — E872 Acidosis, unspecified: Secondary | ICD-10-CM | POA: Diagnosis present

## 2020-02-22 LAB — BASIC METABOLIC PANEL
Anion gap: 12 (ref 5–15)
BUN: 17 mg/dL (ref 6–20)
CO2: 23 mmol/L (ref 22–32)
Calcium: 8.8 mg/dL — ABNORMAL LOW (ref 8.9–10.3)
Chloride: 101 mmol/L (ref 98–111)
Creatinine, Ser: 0.63 mg/dL (ref 0.44–1.00)
GFR, Estimated: >60 mL/min (ref >60)
Glucose, Bld: 126 mg/dL — ABNORMAL HIGH (ref 70–99)
Potassium: 3 mmol/L — ABNORMAL LOW (ref 3.5–5.1)
Sodium: 136 mmol/L (ref 135–145)

## 2020-02-22 LAB — MAGNESIUM: Magnesium: 2.5 mg/dL — ABNORMAL HIGH (ref 1.7–2.4)

## 2020-02-22 LAB — PHOSPHORUS: Phosphorus: 1 mg/dL — CL (ref 2.5–4.6)

## 2020-02-22 LAB — GLUCOSE, CAPILLARY: Glucose-Capillary: 113 mg/dL — ABNORMAL HIGH (ref 70–99)

## 2020-02-22 MED ORDER — POTASSIUM CHLORIDE 10 MEQ/100ML IV SOLN
10.0000 meq | INTRAVENOUS | Status: AC
  Administered 2020-02-22 (×3): 10 meq via INTRAVENOUS
  Filled 2020-02-22 (×3): qty 100

## 2020-02-22 MED ORDER — POTASSIUM PHOSPHATES 15 MMOLE/5ML IV SOLN
30.0000 mmol | Freq: Two times a day (BID) | INTRAVENOUS | Status: DC
  Administered 2020-02-22: 30 mmol via INTRAVENOUS
  Filled 2020-02-22 (×3): qty 10

## 2020-02-22 MED ORDER — POTASSIUM CHLORIDE CRYS ER 20 MEQ PO TBCR: 40.0000 meq | EXTENDED_RELEASE_TABLET | ORAL | Status: AC

## 2020-02-22 MED ORDER — INSULIN ASPART 100 UNIT/ML ~~LOC~~ SOLN: 0.0000 [IU] | Freq: Three times a day (TID) | SUBCUTANEOUS | Status: DC

## 2020-02-22 MED ORDER — POTASSIUM PHOSPHATES 15 MMOLE/5ML IV SOLN
30.0000 mmol | Freq: Two times a day (BID) | INTRAVENOUS | Status: AC
  Administered 2020-02-22 – 2020-02-23 (×3): 30 mmol via INTRAVENOUS
  Filled 2020-02-22 (×3): qty 10

## 2020-02-22 MED ORDER — POTASSIUM CHLORIDE IN NACL 40-0.9 MEQ/L-% IV SOLN
INTRAVENOUS | Status: DC
  Filled 2020-02-22 (×16): qty 1000

## 2020-02-22 MED ORDER — SODIUM CHLORIDE 0.9 % IV SOLN: INTRAVENOUS | Status: DC

## 2020-02-22 NOTE — ED Notes (Signed)
Charge RN notified primary RN and EDP of critical lab result. Primary RN to message attending MD.

## 2020-02-22 NOTE — ED Notes (Signed)
Dr. Doristine Bosworth at bedside to assess pt. States to keep pt NPO until speech therapy can do a swallow eval. This RN was about to administer oral meds but held off at this time.

## 2020-02-22 NOTE — ED Notes (Signed)
Patient re-directed into bed and positioned for comfort. Pt seemingly more oriented and is more responsive to questions and commands than previously. However patient is still disoriented to place and situation. No hallucinations noted at this time.

## 2020-02-22 NOTE — Progress Notes (Signed)
Bedside swallow eval attempted. Pt with congestion upon entering. Given one small sip of water, she presented with coughing and labored breathing. Eventually able to produce a large thick bolus of mucous. O2 Sats remained at 96 percent but Pt was visibly fatigued. Waited with her for several minutes. NO other boluses given. Rec NPO. Ice chips if requested and alert. ST to f/u and reassess as appropriate. Rec using another route for meds unitl Pt is more alert and able to clear secretions better. High risk for aspiration at this time.

## 2020-02-22 NOTE — ED Notes (Signed)
Pt had urinated in bed. purewick had come out of place. Pt assisted to roll onto side to remove soiled linen and brief. Pt cleaned with wipes. Clean linen and brief placed. Replaced purewick as well.

## 2020-02-22 NOTE — ED Notes (Signed)
Called pharmacy. Per pharmacy, okay to run potassium chloride 100mEq in 124mL NS together with potassium phosphate which is currently infusing.

## 2020-02-22 NOTE — Evaluation (Signed)
Clinical/Bedside Swallow Evaluation Patient Details  Name: Yvonne Scott MRN: [id number] Date of Birth: [birth year]  Today's Date: 02/22/2020 Time: SLP Start Time (ACUTE ONLY): 60 SLP Stop Time (ACUTE ONLY): 1300 SLP Time Calculation (min) (ACUTE ONLY): 30 min  Past Medical History:  Past Medical History:  Diagnosis Date  . Anxiety   . Aortic atherosclerosis ([place]) 10/24/2016  . GERD (gastroesophageal reflux disease)    Past Surgical History: History reviewed. No pertinent surgical history. HPI:  Per admitting H&P" Yvonne Scott is a 59 y.o. female with medical history significant for hypertension who presents to the ER with a 1 day complaint of nausea vomiting, shortness of breath and generalized weakness.  Has mild upper abdominal pain, denies diarrhea, fever or chills.  Emesis is nonbilious nonbloody and without coffee grounds.  Denies dysuria.  Denies cough or chest pain. Patient states that for some time now which she is unable to clarify she has been feeling weak and thought it was because of stress. She mentions that she has a lump in her right breast which she has had for several years but lately it became painful. She denies fevers at home."   Assessment / Plan / Recommendation Clinical Impression  Bedside swallow eval was very limited secondary to Pt becoming very fatigued after aspiration of small sip of water. Pt was noted to have a congested cough prior to ST assessment as well as some audible congestion in her breathing. Pt was awake but confused. Reoriented to place and situation. When presented with one small sips of water, Pt began with an immediate cough and needed several minutes of coughing to eventually produce a good amount of thick sputum.  O2 sats remained at 96 percent but Pt had labored breathing for several minutes after the aspiration event. ST sat with Pt for several minutes until she was able to talk and answer questions without significant effort. Rec NPO at  this time as Pt is at high risk for aspiration. Will reassess when more medically stable and able to manage secretions better. Ice chips for pleasure if Pt requests and is alert enough to participate safely. SLP Visit Diagnosis: Dysphagia, oropharyngeal phase (R13.12)    Aspiration Risk  Moderate aspiration risk;Severe aspiration risk    Diet Recommendation Ice chips PRN after oral care   Medication Administration: Via alternative means    Other  Recommendations Oral Care Recommendations: Oral care prior to ice chip/H20   Follow up Recommendations   ST to follow     Frequency and Duration min 3x week  1 week       Prognosis Prognosis for Safe Diet Advancement: Fair      Swallow Study   General Date of Onset: 02/20/20 HPI: Per admitting H&P" Yvonne Scott is a 58 y.o. female with medical history significant for hypertension who presents to the ER with a 1 day complaint of nausea vomiting, shortness of breath and generalized weakness.  Has mild upper abdominal pain, denies diarrhea, fever or chills.  Emesis is nonbilious nonbloody and without coffee grounds.  Denies dysuria.  Denies cough or chest pain. Patient states that for some time now which she is unable to clarify she has been feeling weak and thought it was because of stress. She mentions that she has a lump in her right breast which she has had for several years but lately it became painful. She denies fevers at home." Type of Study: Bedside Swallow Evaluation Diet Prior to this Study: NPO Temperature  Spikes Noted: No Respiratory Status: Room air History of Recent Intubation: No Behavior/Cognition: Cooperative;Confused;Lethargic/Drowsy;Requires cueing Oral Cavity Assessment: Dried secretions Oral Cavity - Dentition: Poor condition Self-Feeding Abilities: Total assist Patient Positioning: Upright in bed Baseline Vocal Quality: Normal Volitional Cough: Wet;Congested    Oral/Motor/Sensory Function Overall Oral  Motor/Sensory Function: Generalized oral weakness Facial Symmetry: Within Functional Limits   Ice Chips Ice chips: Within functional limits Presentation: Spoon   Thin Liquid Thin Liquid: Impaired Presentation: Cup Pharyngeal  Phase Impairments: Cough - Immediate    Nectar Thick Nectar Thick Liquid: Not tested   Honey Thick Honey Thick Liquid: Not tested   Puree Puree: Not tested   Solid     Solid: Not tested      [name] 02/22/2020,6:42 PM

## 2020-02-22 NOTE — ED Notes (Signed)
Assumed report, pt resting calmly in bed. VSS at this time

## 2020-02-22 NOTE — ED Notes (Signed)
Patient provided with ice water and assisted with drinking 1/2 of cup.

## 2020-02-22 NOTE — Progress Notes (Signed)
PROGRESS NOTE  Yvonne Scott:[id number] DOB: [birth year] DOA: 02/20/2020 PCP: Towanda Malkin, MD  HPI/Recap of past 24 hours:  Yvonne Scott is a 59 y.o. female with medical history significant for hypertension who presents to the ER with a 1 day complaint of nausea vomiting, shortness of breath and generalized weakness.  Has mild upper abdominal pain, denies diarrhea, fever or chills.  Emesis is nonbilious nonbloody and without coffee grounds.  Denies dysuria.  Denies cough or chest pain. Patient states that for some time now which she is unable to clarify she has been feeling weak and thought it was because of stress. She mentions that she has a lump in her right breast which she has had for several years but lately it became painful. She denies fevers at home. ED Course: On arrival, afebrile, BP 119/89, pulse 105, tachypneic with respirations 24 with O2 sat 100% on room air.  Blood work significant for WBC of15800, hemoglobin 15.8, glucose 268, anion gap unable to calculate.  Creatinine 1.7 and elevation of liver enzymes with AST 57, ALT 75 total bilirubin 2.7.  Lipase 71. venous pH 7 EKG as reviewed by me : Sinus tachycardia at 108 with no acute ST-T wave changes Imaging: Chest x-ray clear.  RUQ sono preliminary read with no acute findings Patient given IV fluid bolus and started on an insulin infusion.  Hospitalist consulted for admission.   Assessment/Plan: Principal Problem:   DKA (diabetic ketoacidosis) ([place]) Active Problems:   GERD (gastroesophageal reflux disease)   Essential hypertension, benign   Elevated LFTs   AKI (acute kidney injury) ([place])   Leukocytosis   Lump of right breast   Metabolic acidosis   Starvation ketoacidosis: Initially presented with nausea vomiting weakness with elevated sugar in the 200s venous pH of 7, serum bicarb less than 7 and large anion gap.  A1c 4.5 with no prior history of diabetes She was treated with IV insulin and IV  hydration. Patient has a history of acetone ingestion. Acetone level is pending.  She was started on bicarb drip.  Her bicarb is now within normal range so we will stop that.  Dysphagia/possible aspiration pneumonia: When evaluated this morning, patient was having obvious problems with swallowing, repeated coughing and had obvious congestion.  On examination, she had rhonchi bilaterally.  I immediately kept her n.p.o.  SLP was consulted.  Patient failed swallow evaluation at this point in time.  We will continue to keep n.p.o. until reassessed by SLP in the morning.  Ordered chest x-ray.  Acute metabolic encephalopathy, unclear etiology at this point: Follow acetone level result Concern for alcohol withdrawal. Reorient as needed. Delirium precautions. Also cannot rule out stroke so we will proceed with CT head without contrast first.  Alcohol use disorder with concern for alcohol withdrawal: Patient has had visual hallucinations while in the ED. Placed on CIWA protocol. Currently obtunded with acute encephalopathy.  Unable to determine whether this is alcohol withdrawal versus some other etiology.  Pursuing CT head as mentioned above.  Continue CIWA protocol.  AKI: Resolved.  Hypokalemia: 3.0.  Severe hypophosphatemia in the setting of starvation Serum phosphorus less than 1.0 yesterday.  Was replaced and once again it is less than 1.  We will replace with couple more doses today and tomorrow.  Recheck in the morning.  Hypomagnesemia Resolved.  Nausea and vomiting Continue antiemetic.  Code Status: Full code  Family Communication: None at bedside  Disposition Plan: Likely DC to SNF.   Consultants:  None  Procedures:  None  Antimicrobials:  None  DVT prophylaxis: Subcu Lovenox daily  Status is: Inpatient    Dispo:  Patient From: Home  Planned Disposition: Home vs snf  Expected discharge date: 02/24/2020  Medically stable for discharge: No, ongoing management of  starvation ketoacidosis and significant electrolyte abnormality.   Subjective: Patient seen and examined.  Patient was very lethargic and confused upon my evaluation this morning.  She was unable to answer any question.  She had audible congestion and was visibly seemed to have hard time swallowing.  Objective: Vitals:   02/22/20 1000 02/22/20 1100 02/22/20 1130 02/22/20 1300  BP: (!) 124/101 109/79 91/71 (!) 147/113  Pulse: 92 88 88 97  Resp: (!) 24 18 18  (!) 25  Temp:      TempSrc:      SpO2: 97% 98% 98% 98%  Weight:      Height:        Intake/Output Summary (Last 24 hours) at 02/22/2020 1326 Last data filed at 02/22/2020 0825 Gross per 24 hour  Intake 1382.03 ml  Output --  Net 1382.03 ml   Filed Weights   02/20/20 1403  Weight: 54.4 kg    Exam:  General exam: Appears lethargic and confused Respiratory system: Rhonchi bilaterally.  Repeated coughing.  Respiratory effort normal. Cardiovascular system: S1 & S2 heard, RRR. No JVD, murmurs, rubs, gallops or clicks. No pedal edema. Gastrointestinal system: Abdomen is nondistended, soft and nontender. No organomegaly or masses felt. Normal bowel sounds heard. Central nervous system: Lethargic and confused.  Moving all extremities spontaneously to some extent. Extremities: Symmetric 5 x 5 power.  Data Reviewed: CBC: Recent Labs  Lab 02/20/20 1408 02/21/20 0209 02/21/20 1308  WBC 15.8* 11.6* 8.7  HGB 15.8* 14.5 12.5  HCT 49.3* 43.9 35.6*  MCV 101.0* 98.4 92.7  PLT 336 211 494*   Basic Metabolic Panel: Recent Labs  Lab 02/21/20 0603 02/21/20 0953 02/21/20 1308 02/21/20 1901 02/22/20 0610  NA 136 135 136 137 136  K 4.1 4.4 3.3* 3.3* 3.0*  CL 108 105 108 104 101  CO2 12* 14* 13* 18* 23  GLUCOSE 121* 117* 114* 134* 126*  BUN 42* 43* 42* 32* 17  CREATININE 1.47* 1.37* 1.25* 0.94 0.63  CALCIUM 8.3* 9.1 9.0 9.3 8.8*  MG  --   --  1.6* 3.3* 2.5*  PHOS  --   --  <1.0* <1.0* 1.0*   GFR: Estimated Creatinine  Clearance: 64.2 mL/min (by C-G formula based on SCr of 0.63 mg/dL). Liver Function Tests: Recent Labs  Lab 02/20/20 1408  AST 57*  ALT 75*  ALKPHOS 90  BILITOT 2.7*  PROT 9.3*  ALBUMIN 5.5*   Recent Labs  Lab 02/20/20 1408  LIPASE 71*   No results for input(s): AMMONIA in the last 168 hours. Coagulation Profile: No results for input(s): INR, PROTIME in the last 168 hours. Cardiac Enzymes: No results for input(s): CKTOTAL, CKMB, CKMBINDEX, TROPONINI in the last 168 hours. BNP (last 3 results) No results for input(s): PROBNP in the last 8760 hours. HbA1C: Recent Labs    02/21/20 0209  HGBA1C 4.8   CBG: Recent Labs  Lab 02/21/20 0432 02/21/20 0604 02/21/20 0643 02/21/20 0924 02/21/20 1045  GLUCAP 116* 117* 135* 108* 113*   Lipid Profile: No results for input(s): CHOL, HDL, LDLCALC, TRIG, CHOLHDL, LDLDIRECT in the last 72 hours. Thyroid Function Tests: No results for input(s): TSH, T4TOTAL, FREET4, T3FREE, THYROIDAB in the last 72 hours. Anemia Panel: No  results for input(s): VITAMINB12, FOLATE, FERRITIN, TIBC, IRON, RETICCTPCT in the last 72 hours. Urine analysis:    Component Value Date/Time   COLORURINE YELLOW (A) 02/21/2020 0641   APPEARANCEUR HAZY (A) 02/21/2020 0641   APPEARANCEUR Hazy 02/16/2014 0724   LABSPEC 1.015 02/21/2020 0641   LABSPEC 1.014 02/16/2014 0724   PHURINE 5.0 02/21/2020 0641   GLUCOSEU NEGATIVE 02/21/2020 0641   GLUCOSEU Negative 02/16/2014 0724   HGBUR LARGE (A) 02/21/2020 0641   BILIRUBINUR NEGATIVE 02/21/2020 0641   BILIRUBINUR Negative 02/16/2014 0724   KETONESUR 80 (A) 02/21/2020 0641   PROTEINUR 100 (A) 02/21/2020 0641   NITRITE NEGATIVE 02/21/2020 0641   LEUKOCYTESUR NEGATIVE 02/21/2020 0641   LEUKOCYTESUR Negative 02/16/2014 0724   Sepsis Labs: @LABRCNTIP (procalcitonin:4,lacticidven:4)  ) Recent Results (from the past 240 hour(s))  Resp Panel by RT-PCR (Flu A&B, Covid) Nasopharyngeal Swab     Status: None    Collection Time: 02/21/20 12:30 AM   Specimen: Nasopharyngeal Swab; Nasopharyngeal(NP) swabs in vial transport medium  Result Value Ref Range Status   SARS Coronavirus 2 by RT PCR NEGATIVE NEGATIVE Final    Comment: (NOTE) SARS-CoV-2 target nucleic acids are NOT DETECTED.  The SARS-CoV-2 RNA is generally detectable in upper respiratory specimens during the acute phase of infection. The lowest concentration of SARS-CoV-2 viral copies this assay can detect is 138 copies/mL. A negative result does not preclude SARS-Cov-2 infection and should not be used as the sole basis for treatment or other patient management decisions. A negative result may occur with  improper specimen collection/handling, submission of specimen other than nasopharyngeal swab, presence of viral mutation(s) within the areas targeted by this assay, and inadequate number of viral copies(<138 copies/mL). A negative result must be combined with clinical observations, patient history, and epidemiological information. The expected result is Negative.  Fact Sheet for Patients:  EntrepreneurPulse.com.au  Fact Sheet for Healthcare Providers:  IncredibleEmployment.be  This test is no t yet approved or cleared by the [place] FDA and  has been authorized for detection and/or diagnosis of SARS-CoV-2 by FDA under an Emergency Use Authorization (EUA). This EUA will remain  in effect (meaning this test can be used) for the duration of the COVID-19 declaration under Section 564(b)(1) of the Act, 21 U.S.C.section 360bbb-3(b)(1), unless the authorization is terminated  or revoked sooner.       Influenza A by PCR NEGATIVE NEGATIVE Final   Influenza B by PCR NEGATIVE NEGATIVE Final    Comment: (NOTE) The Xpert Xpress SARS-CoV-2/FLU/RSV plus assay is intended as an aid in the diagnosis of influenza from Nasopharyngeal swab specimens and should not be used as a sole basis for treatment.  Nasal washings and aspirates are unacceptable for Xpert Xpress SARS-CoV-2/FLU/RSV testing.  Fact Sheet for Patients: EntrepreneurPulse.com.au  Fact Sheet for Healthcare Providers: IncredibleEmployment.be  This test is not yet approved or cleared by the [place] FDA and has been authorized for detection and/or diagnosis of SARS-CoV-2 by FDA under an Emergency Use Authorization (EUA). This EUA will remain in effect (meaning this test can be used) for the duration of the COVID-19 declaration under Section 564(b)(1) of the Act, 21 U.S.C. section 360bbb-3(b)(1), unless the authorization is terminated or revoked.  Performed at [facility], [address], [place], [place] [address]      Studies: No results found.  Scheduled Meds: . albuterol  2.5 mg Nebulization Once  . folic acid  1 mg Oral Daily  . multivitamin with minerals  1 tablet Oral Daily  .  potassium & sodium phosphates  2 packet Oral TID AC & HS  . potassium chloride  40 mEq Oral Q4H  . thiamine  100 mg Oral Daily   Or  . thiamine  100 mg Intravenous Daily    Continuous Infusions: . lactated ringers    . potassium PHOSPHATE IVPB (in mmol) 85 mL/hr at 02/22/20 0740  . potassium PHOSPHATE IVPB (in mmol) 30 mmol (02/22/20 1141)     LOS: 1 day   Darliss Cheney, MD [facility]  If 7PM-7AM, please contact night-coverage [email or web address] 02/22/2020, 1:26 PM

## 2020-02-22 NOTE — ED Notes (Signed)
Pt to CT

## 2020-02-22 NOTE — ED Notes (Signed)
Pt given phone to contact family 

## 2020-02-22 NOTE — Progress Notes (Signed)
OT Cancellation Note  Patient Details Name: Yvonne Scott MRN: [id number] DOB: 01-09-1961   Cancelled Treatment:    Reason Eval/Treat Not Completed: Fatigue/lethargy limiting ability to participate. OT order received and chart reviewed. Attempted x2 this date, pt sleeping deeply this AM. PM pt awakens to touch, asks for ice chips. Attempted to get pt engaged in upright position for chips and pt began snoring, eyes closed. Pt noted to have had ativan at 1027. Will continue to follow and initiate services as pt medically appropriate to participate in therapy.   Dessie Coma, M.S. OTR/L  02/22/20, 2:48 PM  ascom [phone]

## 2020-02-23 ENCOUNTER — Inpatient Hospital Stay: Payer: Self-pay

## 2020-02-23 LAB — COMPREHENSIVE METABOLIC PANEL
ALT: 39 U/L (ref 0–44)
AST: 45 U/L — ABNORMAL HIGH (ref 15–41)
Albumin: 3.3 g/dL — ABNORMAL LOW (ref 3.5–5.0)
Alkaline Phosphatase: 57 U/L (ref 38–126)
Anion gap: 10 (ref 5–15)
BUN: 6 mg/dL (ref 6–20)
CO2: 25 mmol/L (ref 22–32)
Calcium: 8 mg/dL — ABNORMAL LOW (ref 8.9–10.3)
Chloride: 103 mmol/L (ref 98–111)
Creatinine, Ser: 0.42 mg/dL — ABNORMAL LOW (ref 0.44–1.00)
GFR, Estimated: >60 mL/min (ref >60)
Glucose, Bld: 104 mg/dL — ABNORMAL HIGH (ref 70–99)
Potassium: 3.9 mmol/L (ref 3.5–5.1)
Sodium: 138 mmol/L (ref 135–145)
Total Bilirubin: 0.8 mg/dL (ref 0.3–1.2)
Total Protein: 6.1 g/dL — ABNORMAL LOW (ref 6.5–8.1)

## 2020-02-23 LAB — CBC WITH DIFFERENTIAL/PLATELET
Abs Immature Granulocytes: 0.02 10*3/uL (ref 0.00–0.07)
Basophils Absolute: 0 10*3/uL (ref 0.0–0.1)
Basophils Relative: 0 %
Eosinophils Absolute: 0 10*3/uL (ref 0.0–0.5)
Eosinophils Relative: 0 %
HCT: 32.7 % — ABNORMAL LOW (ref 36.0–46.0)
Hemoglobin: 11.6 g/dL — ABNORMAL LOW (ref 12.0–15.0)
Immature Granulocytes: 0 %
Lymphocytes Relative: 35 %
Lymphs Abs: 1.9 10*3/uL (ref 0.7–4.0)
MCH: 32.7 pg (ref 26.0–34.0)
MCHC: 35.5 g/dL (ref 30.0–36.0)
MCV: 92.1 fL (ref 80.0–100.0)
Monocytes Absolute: 0.2 10*3/uL (ref 0.1–1.0)
Monocytes Relative: 4 %
Neutro Abs: 3.3 10*3/uL (ref 1.7–7.7)
Neutrophils Relative %: 61 %
Platelets: 143 10*3/uL — ABNORMAL LOW (ref 150–400)
RBC: 3.55 MIL/uL — ABNORMAL LOW (ref 3.87–5.11)
RDW: 15.1 % (ref 11.5–15.5)
WBC: 5.4 10*3/uL (ref 4.0–10.5)
nRBC: 0 % (ref 0.0–0.2)

## 2020-02-23 LAB — GLUCOSE, CAPILLARY
Glucose-Capillary: 92 mg/dL (ref 70–99)
Glucose-Capillary: 94 mg/dL (ref 70–99)
Glucose-Capillary: 106 mg/dL — ABNORMAL HIGH (ref 70–99)
Glucose-Capillary: 96 mg/dL (ref 70–99)

## 2020-02-23 LAB — PHOSPHORUS: Phosphorus: 2.4 mg/dL — ABNORMAL LOW (ref 2.5–4.6)

## 2020-02-23 LAB — MAGNESIUM: Magnesium: 1.9 mg/dL (ref 1.7–2.4)

## 2020-02-23 MED ORDER — GADOBUTROL 1 MMOL/ML IV SOLN
6.0000 mL | Freq: Once | INTRAVENOUS | Status: AC | PRN
  Administered 2020-02-24: 6 mL via INTRAVENOUS

## 2020-02-23 NOTE — Plan of Care (Signed)

## 2020-02-23 NOTE — Evaluation (Signed)
Occupational Therapy Evaluation Patient Details Name: Yvonne Scott MRN: [id number] DOB: [birth year] Today's Date: 02/23/2020    History of Present Illness Yvonne Scott is a 59 y.o. female with medical history significant for hypertension who presents to the ER with a 1 day complaint of nausea vomiting, shortness of breath and generalized weakness.  Has mild upper abdominal pain, denies diarrhea, fever or chills.  Emesis is nonbilious nonbloody and without coffee grounds.  Denies dysuria.  Denies cough or chest pain. Patient states that for some time now which she is unable to clarify she has been feeling weak and thought it was because of stress. 02/22/20 Head CT: Small hyperdense dural based mass measuring 0.7 cm in the  posterior parietal lobe, most likely a meningioma in the absence of  trauma.   Clinical Impression   Yvonne Scott was seen for OT evaluation this date. Prior to hospital admission, pt reports Independence for mobility/ADLs - pt is poor historian. Pt is unsure of where she would d/c to and who could assist, reports recent passing of friend and his father who she lived with. Pt presents to acute OT demonstrating impaired ADL performance and functional mobility 2/2 poor insight into deficits, decreased activity tolerance, and functional strength/balance deficits.   Pt currently requires MIN A bed mobility. Initial standing trial CGA + HHA - pt quickly returned to sitting w/o warning. 2nd/3rd standing trial MIN A + HHA. MOD A for side steps at EOB + VCs for sequencing. SETUP self-feeding ice chips seated EOB. MOD A for LB access seated EOB. Pt would benefit from skilled OT to address noted impairments and functional limitations (see below for any additional details) in order to maximize safety and independence while minimizing falls risk and caregiver burden. Upon hospital discharge, recommend STR to maximize pt safety and return to PLOF.     Follow Up Recommendations  SNF     Equipment Recommendations  Other (comment) (TBD)    Recommendations for Other Services       Precautions / Restrictions Precautions Precautions: Fall;Other (comment) (aspiration) Restrictions Weight Bearing Restrictions: No      Mobility Bed Mobility Overal bed mobility: Needs Assistance Bed Mobility: Supine to Sit;Sit to Supine     Supine to sit: Min assist;HOB elevated Sit to supine: Min assist        Transfers Overall transfer level: Needs assistance Equipment used: 2 person hand held assist Transfers: Sit to/from Stand Sit to Stand: Min assist    General transfer comment: Initial standing trial CGA + HHA - pt quickly returned to sitting w/o warning. 2nd/3rd standing trial MIN A + HHA. MOD A for side steps at EOB + VCs for sequencing    Balance Overall balance assessment: Needs assistance Sitting-balance support: Single extremity supported;Feet supported Sitting balance-Leahy Scale: Fair     Standing balance support: Bilateral upper extremity supported Standing balance-Leahy Scale: Poor Standing balance comment: quickly terminates standing trial and sits, questionable BLE buckling                           ADL either performed or assessed with clinical judgement   ADL Overall ADL's : Needs assistance/impaired                                       General ADL Comments: SETUP self-feeding ice chips seated EOB. MOD A for  LB access seated EOB. MIN A x2 for ADL t/f                  Pertinent Vitals/Pain Pain Assessment: Faces Faces Pain Scale: Hurts a little bit Pain Location: abdomen Pain Descriptors / Indicators: Dull;Discomfort Pain Intervention(s): Limited activity within patient's tolerance;Repositioned     Hand Dominance Right   Extremity/Trunk Assessment Upper Extremity Assessment Upper Extremity Assessment: Generalized weakness   Lower Extremity Assessment Lower Extremity Assessment: Generalized weakness        Communication Communication Communication: No difficulties   Cognition Arousal/Alertness: Awake/alert Behavior During Therapy: Flat affect Overall Cognitive Status: No family/caregiver present to determine baseline cognitive functioning Area of Impairment: Orientation;Following commands;Problem solving                 Orientation Level: Disoriented to;Situation     Following Commands: Follows one step commands with increased time     Problem Solving: Requires verbal cues;Decreased initiation General Comments: Disoriented to situation only. Required increased time and repetition for 1 step commands   General Comments  HR 110s during mobility    Exercises Exercises: Other exercises Other Exercises Other Exercises: Pt educated re: OT role, DME recs, d/c recs, falls prevention, ECS, NPO status Other Exercises: LBD, UBD, self-feeding, sup<>sit, sit<>stand x3, sitting/standing balance/tolerance   Shoulder Instructions      Home Living Family/patient expects to be discharged to:: Unsure                                 Additional Comments: Pt states was previously living at freinds' fathers home assisting with his care, but his father passed away and now her friend has passed away and she is not sure where she will live or if any support available      Prior Functioning/Environment Level of Independence: Independent        Comments: Pt is poor hisprian - reports no AD for mobility and ADLs        OT Problem List: Decreased strength;Decreased activity tolerance;Impaired balance (sitting and/or standing);Decreased safety awareness;Decreased knowledge of use of DME or AE      OT Treatment/Interventions: Self-care/ADL training;Therapeutic exercise;Energy conservation;DME and/or AE instruction;Therapeutic activities;Patient/family education;Balance training    OT Goals(Current goals can be found in the care plan section) Acute Rehab OT Goals Patient  Stated Goal: To feel better OT Goal Formulation: With patient Time For Goal Achievement: 03/08/20 Potential to Achieve Goals: Good ADL Goals Pt Will Perform Grooming: with min assist;standing (c LRAD PRN) Pt Will Perform Lower Body Dressing: with supervision;sitting/lateral leans Pt Will Transfer to Toilet: with min guard assist;stand pivot transfer;bedside commode (c LRAD PRN)  OT Frequency: Min 1X/week   Barriers to D/C: Inaccessible home environment;Decreased caregiver support          Co-evaluation PT/OT/SLP Co-Evaluation/Treatment: Yes Reason for Co-Treatment: Necessary to address cognition/behavior during functional activity;To address functional/ADL transfers PT goals addressed during session: Mobility/safety with mobility;Balance OT goals addressed during session: ADL's and self-care      AM-PAC OT "6 Clicks" Daily Activity     Outcome Measure Help from another person eating meals?: None Help from another person taking care of personal grooming?: A Little Help from another person toileting, which includes using toliet, bedpan, or urinal?: A Lot Help from another person bathing (including washing, rinsing, drying)?: A Lot Help from another person to put on and taking off regular upper body clothing?: A Little Help  from another person to put on and taking off regular lower body clothing?: A Lot 6 Click Score: 16   End of Session Nurse Communication: Mobility status  Activity Tolerance: Patient tolerated treatment well Patient left: in bed;with call bell/phone within reach;with bed alarm set  OT Visit Diagnosis: Other abnormalities of gait and mobility (R26.89)                Time: 1747-1595 OT Time Calculation (min): 23 min Charges:  OT General Charges $OT Visit: 1 Visit OT Evaluation $OT Eval Moderate Complexity: 1 Mod  Dessie Coma, M.S. OTR/L  02/23/20, 11:09 AM  ascom [phone]

## 2020-02-23 NOTE — TOC Progression Note (Signed)
Transition of Care [facility]) - Progression Note    Patient Details  Name: Yvonne Scott MRN: [id number] Date of Birth: 1960-06-20  Transition of [facility]) CM/SW [place], LCSW Phone Number: 02/23/2020, 3:05 PM  Clinical Narrative:    TOC acknowledged recommendation for SNF placement, CSW notified due to patient's having no insurance SNF placement is not an option. CSW did suggest when patient is medically stable for discharge, it may be possible for patient to receive HH/PT via charity agency        Expected Discharge Plan and Services                                                 Social Determinants of Health (SDOH) Interventions    Readmission Risk Interventions No flowsheet data found.

## 2020-02-23 NOTE — Evaluation (Signed)
Physical Therapy Evaluation Patient Details Name: OSCEOLA HOLIAN MRN: [id number] DOB: Sep 25, 1960 Today's Date: 02/23/2020   History of Present Illness  Patient is a 59 y.o. female with medical history significant for hypertension who presents to the ER with a one day complaint of nausea vomiting, shortness of breath and generalized weakness. Starvation ketoacidosis, Dysphagia/possible aspiration pneumonia, Acute metabolic encephalopathy with unclear etiology, Alcohol use disorder with concern for alcohol withdrawal and placed on CIWA protocol, electrolyte abnormality. Head CT shows small hyperdense dural based mass measuring 0.7 cm in the  posterior parietal lobe, most likely a meningioma in the absence of  trauma.  Clinical Impression  PT evaluation completed. Patient is slow to respond verbally and with increased time for motor planning. Patient able to follow single step commands consistently. Patient appears with generalized weakness. Assistance required for bed mobility and transfers. Patient unable to ambulate more than 2-3 steps due to limited standing tolerance, possible knee buckling, and abruptly sits when fatigued. Heart rate in around 110bpm with activity. Patient reports she is previously independent with mobility. Recommend PT to maximize independence and address remaining functional limitations. Consider SNF placement at discharge.     Follow Up Recommendations SNF    Equipment Recommendations   (ongoing assessment)    Recommendations for Other Services       Precautions / Restrictions Precautions Precautions: Fall Restrictions Weight Bearing Restrictions: No      Mobility  Bed Mobility Overal bed mobility: Needs Assistance Bed Mobility: Supine to Sit;Sit to Supine     Supine to sit: Min assist;HOB elevated Sit to supine: Min assist   General bed mobility comments: verbal cues for task initiation    Transfers Overall transfer level: Needs  assistance Equipment used: 2 person hand held assist Transfers: Sit to/from Stand Sit to Stand: Min assist         General transfer comment: 3 bouts of standing performed. CGA with first and and Min A with second and third stand. verbal cues for hand placement and technique.  Ambulation/Gait             General Gait Details: Mod A for taking 2-3 side step along edge of bed. patient sits abruptly, questionable knee buckling, and is fatigued with minimal activity. weight shifting assistance provided  Stairs            Wheelchair Mobility    Modified Rankin (Stroke Patients Only)       Balance Overall balance assessment: Needs assistance Sitting-balance support: Single extremity supported;Feet supported Sitting balance-Leahy Scale: Fair     Standing balance support: Bilateral upper extremity supported Standing balance-Leahy Scale: Poor Standing balance comment: patient sits abrutly, possible loss of balance posteriorly.                             Pertinent Vitals/Pain Pain Assessment: Faces Faces Pain Scale: Hurts a little bit Pain Location: abdomen Pain Descriptors / Indicators: Dull;Discomfort Pain Intervention(s): Limited activity within patient's tolerance    Home Living Family/patient expects to be discharged to:: Unsure                 Additional Comments: patient is a poor historian and reports she is unsure where she will go at discharge.    Prior Function Level of Independence: Independent         Comments: per patient report     Hand Dominance   Dominant Hand: Right    Extremity/Trunk Assessment  Upper Extremity Assessment Upper Extremity Assessment: Generalized weakness    Lower Extremity Assessment Lower Extremity Assessment: Generalized weakness       Communication   Communication: No difficulties  Cognition Arousal/Alertness: Awake/alert Behavior During Therapy: Flat affect Overall Cognitive Status: No  family/caregiver present to determine baseline cognitive functioning Area of Impairment: Orientation;Following commands;Problem solving                 Orientation Level: Disoriented to;Situation     Following Commands: Follows one step commands with increased time     Problem Solving: Requires verbal cues;Decreased initiation General Comments: Disoriented to situation only. Required increased time and repetition for 1 step commands      General Comments General comments (skin integrity, edema, etc.): HR 110s during mobility    Exercises Other Exercises Other Exercises: Pt educated re: OT role, DME recs, d/c recs, falls prevention, ECS, NPO status Other Exercises: LBD, UBD, self-feeding, sup<>sit, sit<>stand x3, sitting/standing balance/tolerance   Assessment/Plan    PT Assessment Patient needs continued PT services  PT Problem List Decreased strength;Decreased activity tolerance;Decreased balance;Decreased mobility;Decreased knowledge of use of DME       PT Treatment Interventions Gait training;DME instruction;Stair training;Functional mobility training;Therapeutic activities;Therapeutic exercise;Balance training;Cognitive remediation    PT Goals (Current goals can be found in the Care Plan section)  Acute Rehab PT Goals Patient Stated Goal: To feel better and be able to have water PT Goal Formulation: With patient Time For Goal Achievement: 03/08/20 Potential to Achieve Goals: Fair    Frequency Min 2X/week   Barriers to discharge        Co-evaluation   Reason for Co-Treatment: Necessary to address cognition/behavior during functional activity PT goals addressed during session: Mobility/safety with mobility OT goals addressed during session: ADL's and self-care       AM-PAC PT "6 Clicks" Mobility  Outcome Measure Help needed turning from your back to your side while in a flat bed without using bedrails?: A Little Help needed moving from lying on your back  to sitting on the side of a flat bed without using bedrails?: A Little Help needed moving to and from a bed to a chair (including a wheelchair)?: A Lot Help needed standing up from a chair using your arms (e.g., wheelchair or bedside chair)?: A Lot Help needed to walk in hospital room?: A Lot Help needed climbing 3-5 steps with a railing? : A Lot 6 Click Score: 14    End of Session Equipment Utilized During Treatment: Gait belt Activity Tolerance: Patient limited by fatigue Patient left: in bed;with call bell/phone within reach;with bed alarm set Nurse Communication: Mobility status PT Visit Diagnosis: Unsteadiness on feet (R26.81);Muscle weakness (generalized) (M62.81)    Time: 7903-8333 PT Time Calculation (min) (ACUTE ONLY): 20 min   Charges:   PT Evaluation $PT Eval Moderate Complexity: 1 Mod PT Treatments $Therapeutic Activity: 8-22 mins        Minna Merritts, PT, MPT  Percell Locus 02/23/2020, 2:01 PM

## 2020-02-23 NOTE — Progress Notes (Signed)
PROGRESS NOTE  Yvonne Scott URK:[id number] DOB: 12-28-1960 DOA: 02/20/2020 PCP: Towanda Malkin, MD  HPI/Recap of past 24 hours:  Yvonne Scott is a 59 y.o. female with medical history significant for hypertension who presents to the ER with a 1 day complaint of nausea vomiting, shortness of breath and generalized weakness.  Has mild upper abdominal pain, denies diarrhea, fever or chills.  Emesis is nonbilious nonbloody and without coffee grounds.  Denies dysuria.  Denies cough or chest pain. Patient states that for some time now which she is unable to clarify she has been feeling weak and thought it was because of stress. She mentions that she has a lump in her right breast which she has had for several years but lately it became painful. She denies fevers at home. ED Course: On arrival, afebrile, BP 119/89, pulse 105, tachypneic with respirations 24 with O2 sat 100% on room air.  Blood work significant for WBC of15800, hemoglobin 15.8, glucose 268, anion gap unable to calculate.  Creatinine 1.7 and elevation of liver enzymes with AST 57, ALT 75 total bilirubin 2.7.  Lipase 71. venous pH 7 EKG as reviewed by me : Sinus tachycardia at 108 with no acute ST-T wave changes Imaging: Chest x-ray clear.  RUQ sono preliminary read with no acute findings Patient given IV fluid bolus and started on an insulin infusion.  Hospitalist consulted for admission.   Assessment/Plan: Principal Problem:   DKA (diabetic ketoacidosis) ([place]) Active Problems:   GERD (gastroesophageal reflux disease)   Essential hypertension, benign   Elevated LFTs   AKI (acute kidney injury) ([place])   Leukocytosis   Lump of right breast   Metabolic acidosis   Starvation ketoacidosis: Initially presented with nausea vomiting weakness with elevated sugar in the 200s venous pH of 7, serum bicarb less than 7 and large anion gap.  A1c 4.5 with no prior history of diabetes She was treated with IV insulin and IV  hydration. Patient has a history of acetone ingestion. Acetone level is pending.  Ketoacidosis has resolved.  Dysphagia: Patient evaluated by SLP yesterday and due to encephalopathy, she failed swallow evaluation.  She remains n.p.o.  SLP to assess her again today.  She is slightly more awake today.  Chest x-ray negative for pneumonia.  She is not hypoxic, no fever, no leukocytosis.  No clear evidence of aspiration pneumonia.  Her lungs are sounding better today.  Will watch without antibiotics.  Acute metabolic encephalopathy, unclear etiology at this point: Follow acetone level result. Reorient as needed. Delirium precautions.  CT head negative for acute stroke but it suspects possible meningioma.  We will proceed with MRI without contrast.  She is more alert and oriented x3 today.  Alcohol use disorder with concern for alcohol withdrawal: Patient has had visual hallucinations while in the ED. Placed on CIWA protocol.  No clear signs of alcohol withdrawal at this point in time.  AKI: Resolved.  Hypokalemia: Resolved.  Severe hypophosphatemia in the setting of starvation Phosphate over 2 today.  Will receive another dose today.  Will recheck in the morning.  Hypomagnesemia Resolved.  Nausea and vomiting Continue antiemetic.  Code Status: Full code  Family Communication: None at bedside  Disposition Plan: Likely DC to SNF.  PT OT to assess her.   Consultants:  None  Procedures:  None  Antimicrobials:  None  DVT prophylaxis: Subcu Lovenox daily  Status is: Inpatient    Dispo:  Patient From: Home  Planned Disposition: Home vs  snf  Expected discharge date: 02/24/2020  Medically stable for discharge: No, ongoing management of starvation ketoacidosis and significant electrolyte abnormality.   Subjective: Patient seen and examined.  She is more alert and oriented x3.  Complains of being thirsty and hungry.  Objective: Vitals:   02/22/20 2030 02/22/20 2116 02/23/20  0349 02/23/20 0741  BP: 114/82 (!) 134/98 124/83 114/76  Pulse:  98 68 100  Resp: 17 17 17 18   Temp:  99 F (37.2 C) 98 F (36.7 C) 98.2 F (36.8 C)  TempSrc:  Oral Oral Oral  SpO2:  98% 94% 94%  Weight:  61 kg    Height:  5\' 4"  (1.626 m)      Intake/Output Summary (Last 24 hours) at 02/23/2020 1104 Last data filed at 02/23/2020 1050 Gross per 24 hour  Intake 1522.91 ml  Output 2000 ml  Net -477.09 ml   Filed Weights   02/20/20 1403 02/22/20 2116  Weight: 54.4 kg 61 kg    Exam:  General exam: Only slightly lethargic but almost fully oriented, much improved compared to yesterday Respiratory system: Clear to auscultation. Respiratory effort normal. Cardiovascular system: S1 & S2 heard, RRR. No JVD, murmurs, rubs, gallops or clicks. No pedal edema. Gastrointestinal system: Abdomen is nondistended, soft and nontender. No organomegaly or masses felt. Normal bowel sounds heard. Central nervous system: Slightly lethargic but fully oriented. No focal neurological deficits. Extremities: Symmetric 5 x 5 power. Skin: No rashes, lesions or ulcers.   Data Reviewed: CBC: Recent Labs  Lab 02/20/20 1408 02/21/20 0209 02/21/20 1308 02/23/20 0651  WBC 15.8* 11.6* 8.7 5.4  NEUTROABS  --   --   --  3.3  HGB 15.8* 14.5 12.5 11.6*  HCT 49.3* 43.9 35.6* 32.7*  MCV 101.0* 98.4 92.7 92.1  PLT 336 211 149* 315*   Basic Metabolic Panel: Recent Labs  Lab 02/21/20 0953 02/21/20 1308 02/21/20 1901 02/22/20 0610 02/23/20 0651  NA 135 136 137 136 138  K 4.4 3.3* 3.3* 3.0* 3.9  CL 105 108 104 101 103  CO2 14* 13* 18* 23 25  GLUCOSE 117* 114* 134* 126* 104*  BUN 43* 42* 32* 17 6  CREATININE 1.37* 1.25* 0.94 0.63 0.42*  CALCIUM 9.1 9.0 9.3 8.8* 8.0*  MG  --  1.6* 3.3* 2.5* 1.9  PHOS  --  <1.0* <1.0* 1.0* 2.4*   GFR: Estimated Creatinine Clearance: 64.6 mL/min (A) (by C-G formula based on SCr of 0.42 mg/dL (L)). Liver Function Tests: Recent Labs  Lab 02/20/20 1408  02/23/20 0651  AST 57* 45*  ALT 75* 39  ALKPHOS 90 57  BILITOT 2.7* 0.8  PROT 9.3* 6.1*  ALBUMIN 5.5* 3.3*   Recent Labs  Lab 02/20/20 1408  LIPASE 71*   No results for input(s): AMMONIA in the last 168 hours. Coagulation Profile: No results for input(s): INR, PROTIME in the last 168 hours. Cardiac Enzymes: No results for input(s): CKTOTAL, CKMB, CKMBINDEX, TROPONINI in the last 168 hours. BNP (last 3 results) No results for input(s): PROBNP in the last 8760 hours. HbA1C: Recent Labs    02/21/20 0209  HGBA1C 4.8   CBG: Recent Labs  Lab 02/21/20 0643 02/21/20 0924 02/21/20 1045 02/22/20 2119 02/23/20 0742  GLUCAP 135* 108* 113* 113* 92   Lipid Profile: No results for input(s): CHOL, HDL, LDLCALC, TRIG, CHOLHDL, LDLDIRECT in the last 72 hours. Thyroid Function Tests: No results for input(s): TSH, T4TOTAL, FREET4, T3FREE, THYROIDAB in the last 72 hours. Anemia Panel: No results  for input(s): VITAMINB12, FOLATE, FERRITIN, TIBC, IRON, RETICCTPCT in the last 72 hours. Urine analysis:    Component Value Date/Time   COLORURINE YELLOW (A) 02/21/2020 0641   APPEARANCEUR HAZY (A) 02/21/2020 0641   APPEARANCEUR Hazy 02/16/2014 0724   LABSPEC 1.015 02/21/2020 0641   LABSPEC 1.014 02/16/2014 0724   PHURINE 5.0 02/21/2020 0641   GLUCOSEU NEGATIVE 02/21/2020 0641   GLUCOSEU Negative 02/16/2014 0724   HGBUR LARGE (A) 02/21/2020 0641   BILIRUBINUR NEGATIVE 02/21/2020 0641   BILIRUBINUR Negative 02/16/2014 0724   KETONESUR 80 (A) 02/21/2020 0641   PROTEINUR 100 (A) 02/21/2020 0641   NITRITE NEGATIVE 02/21/2020 0641   LEUKOCYTESUR NEGATIVE 02/21/2020 0641   LEUKOCYTESUR Negative 02/16/2014 0724   Sepsis Labs: @LABRCNTIP (procalcitonin:4,lacticidven:4)  ) Recent Results (from the past 240 hour(s))  Resp Panel by RT-PCR (Flu A&B, Covid) Nasopharyngeal Swab     Status: None   Collection Time: 02/21/20 12:30 AM   Specimen: Nasopharyngeal Swab; Nasopharyngeal(NP) swabs in  vial transport medium  Result Value Ref Range Status   SARS Coronavirus 2 by RT PCR NEGATIVE NEGATIVE Final    Comment: (NOTE) SARS-CoV-2 target nucleic acids are NOT DETECTED.  The SARS-CoV-2 RNA is generally detectable in upper respiratory specimens during the acute phase of infection. The lowest concentration of SARS-CoV-2 viral copies this assay can detect is 138 copies/mL. A negative result does not preclude SARS-Cov-2 infection and should not be used as the sole basis for treatment or other patient management decisions. A negative result may occur with  improper specimen collection/handling, submission of specimen other than nasopharyngeal swab, presence of viral mutation(s) within the areas targeted by this assay, and inadequate number of viral copies(<138 copies/mL). A negative result must be combined with clinical observations, patient history, and epidemiological information. The expected result is Negative.  Fact Sheet for Patients:  EntrepreneurPulse.com.au  Fact Sheet for Healthcare Providers:  IncredibleEmployment.be  This test is no t yet approved or cleared by the [place] FDA and  has been authorized for detection and/or diagnosis of SARS-CoV-2 by FDA under an Emergency Use Authorization (EUA). This EUA will remain  in effect (meaning this test can be used) for the duration of the COVID-19 declaration under Section 564(b)(1) of the Act, 21 U.S.C.section 360bbb-3(b)(1), unless the authorization is terminated  or revoked sooner.       Influenza A by PCR NEGATIVE NEGATIVE Final   Influenza B by PCR NEGATIVE NEGATIVE Final    Comment: (NOTE) The Xpert Xpress SARS-CoV-2/FLU/RSV plus assay is intended as an aid in the diagnosis of influenza from Nasopharyngeal swab specimens and should not be used as a sole basis for treatment. Nasal washings and aspirates are unacceptable for Xpert Xpress SARS-CoV-2/FLU/RSV testing.  Fact  Sheet for Patients: EntrepreneurPulse.com.au  Fact Sheet for Healthcare Providers: IncredibleEmployment.be  This test is not yet approved or cleared by the [place] FDA and has been authorized for detection and/or diagnosis of SARS-CoV-2 by FDA under an Emergency Use Authorization (EUA). This EUA will remain in effect (meaning this test can be used) for the duration of the COVID-19 declaration under Section 564(b)(1) of the Act, 21 U.S.C. section 360bbb-3(b)(1), unless the authorization is terminated or revoked.  Performed at [facility], [address] [place], [place] [address]      Studies: CT HEAD WO CONTRAST  Result Date: 02/22/2020 CLINICAL DATA:  medical history significant for hypertension who presents to the ER with a 1 day complaint of nausea vomiting, shortness of breath and generalized  weakness. Has mild upper abdominal pain, denies diarrhea, fever or chills. PF a a half the the year the blood the EXAM: CT HEAD WITHOUT CONTRAST TECHNIQUE: Contiguous axial images were obtained from the base of the skull through the vertex without intravenous contrast. COMPARISON:  None. FINDINGS: Brain: No evidence of acute infarction, hemorrhage, hydrocephalus, extra-axial collection. There is a small hyperdense dural based mass measuring 0.7 cm in the posterior parietal lobe, most likely a meningioma (series 5, image 21). No associated mass effect. Vascular: No hyperdense vessel or unexpected calcification. Skull: Normal. Negative for fracture or focal lesion. Sinuses/Orbits: No acute finding. Other: None. IMPRESSION: 1. No acute intracranial process. 2. Small hyperdense dural based mass measuring 0.7 cm in the posterior parietal lobe, most likely a meningioma in the absence of trauma. Recommend nonemergent contrast enhanced MRI for confirmation. Electronically Signed   By: Audie Pinto M.D.   On: 02/22/2020 14:22   DG Chest Port 1  View  Result Date: 02/22/2020 CLINICAL DATA:  57-year-old female presenting with nausea, vomiting, and shortness of breath. EXAM: PORTABLE CHEST 1 VIEW COMPARISON:  02/20/2020 FINDINGS: The heart size and mediastinal contours are within normal limits. Bibasilar subsegmental atelectasis. Atherosclerotic calcification of the aortic arch. The visualized skeletal structures are unremarkable. IMPRESSION: Bibasilar subsegmental atelectasis. Electronically Signed   By: Ruthann Cancer MD   On: 02/22/2020 14:28    Scheduled Meds: . folic acid  1 mg Oral Daily  . insulin aspart  0-9 Units Subcutaneous TID WC  . multivitamin with minerals  1 tablet Oral Daily  . potassium & sodium phosphates  2 packet Oral TID AC & HS  . thiamine  100 mg Oral Daily   Or  . thiamine  100 mg Intravenous Daily    Continuous Infusions: . 0.9 % NaCl with KCl 40 mEq / L 125 mL/hr at 02/23/20 0113  . lactated ringers    . potassium PHOSPHATE IVPB (in mmol) Stopped (02/23/20 0112)     LOS: 2 days   Darliss Cheney, MD [facility]  If 7PM-7AM, please contact night-coverage [email or web address] 02/23/2020, 11:04 AM

## 2020-02-24 ENCOUNTER — Inpatient Hospital Stay: Payer: Self-pay

## 2020-02-24 LAB — COMPREHENSIVE METABOLIC PANEL
ALT: 44 U/L (ref 0–44)
AST: 52 U/L — ABNORMAL HIGH (ref 15–41)
Albumin: 3.1 g/dL — ABNORMAL LOW (ref 3.5–5.0)
Alkaline Phosphatase: 62 U/L (ref 38–126)
Anion gap: 10 (ref 5–15)
BUN: 5 mg/dL — ABNORMAL LOW (ref 6–20)
CO2: 22 mmol/L (ref 22–32)
Calcium: 7.9 mg/dL — ABNORMAL LOW (ref 8.9–10.3)
Chloride: 109 mmol/L (ref 98–111)
Creatinine, Ser: 0.51 mg/dL (ref 0.44–1.00)
GFR, Estimated: >60 mL/min (ref >60)
Glucose, Bld: 99 mg/dL (ref 70–99)
Potassium: 4.2 mmol/L (ref 3.5–5.1)
Sodium: 141 mmol/L (ref 135–145)
Total Bilirubin: 0.7 mg/dL (ref 0.3–1.2)
Total Protein: 5.9 g/dL — ABNORMAL LOW (ref 6.5–8.1)

## 2020-02-24 LAB — GLUCOSE, CAPILLARY
Glucose-Capillary: 101 mg/dL — ABNORMAL HIGH (ref 70–99)
Glucose-Capillary: 103 mg/dL — ABNORMAL HIGH (ref 70–99)
Glucose-Capillary: 102 mg/dL — ABNORMAL HIGH (ref 70–99)
Glucose-Capillary: 101 mg/dL — ABNORMAL HIGH (ref 70–99)

## 2020-02-24 LAB — CBC WITH DIFFERENTIAL/PLATELET
Abs Immature Granulocytes: 0.03 10*3/uL (ref 0.00–0.07)
Basophils Absolute: 0 10*3/uL (ref 0.0–0.1)
Basophils Relative: 0 %
Eosinophils Absolute: 0.1 10*3/uL (ref 0.0–0.5)
Eosinophils Relative: 1 %
HCT: 32.8 % — ABNORMAL LOW (ref 36.0–46.0)
Hemoglobin: 11.1 g/dL — ABNORMAL LOW (ref 12.0–15.0)
Immature Granulocytes: 1 %
Lymphocytes Relative: 30 %
Lymphs Abs: 1.6 10*3/uL (ref 0.7–4.0)
MCH: 32.2 pg (ref 26.0–34.0)
MCHC: 33.8 g/dL (ref 30.0–36.0)
MCV: 95.1 fL (ref 80.0–100.0)
Monocytes Absolute: 0.3 10*3/uL (ref 0.1–1.0)
Monocytes Relative: 5 %
Neutro Abs: 3.3 10*3/uL (ref 1.7–7.7)
Neutrophils Relative %: 63 %
Platelets: 154 10*3/uL (ref 150–400)
RBC: 3.45 MIL/uL — ABNORMAL LOW (ref 3.87–5.11)
RDW: 15.4 % (ref 11.5–15.5)
WBC: 5.3 10*3/uL (ref 4.0–10.5)
nRBC: 0 % (ref 0.0–0.2)

## 2020-02-24 LAB — VOLATILES,BLD-ACETONE,ETHANOL,ISOPROP,METHANOL
Acetone, blood: 0.012 g/dL — ABNORMAL HIGH (ref 0.000–0.010)
Ethanol, blood: <0.01 g/dL (ref 0.000–0.010)
Isopropanol, blood: <0.01 g/dL (ref 0.000–0.010)
Methanol, blood: <0.01 g/dL (ref 0.000–0.010)

## 2020-02-24 LAB — MAGNESIUM: Magnesium: 1.9 mg/dL (ref 1.7–2.4)

## 2020-02-24 LAB — PHOSPHORUS: Phosphorus: 2.5 mg/dL (ref 2.5–4.6)

## 2020-02-24 MED ORDER — ALBUTEROL SULFATE (2.5 MG/3ML) 0.083% IN NEBU
2.5000 mg | INHALATION_SOLUTION | Freq: Once | RESPIRATORY_TRACT | Status: AC
  Administered 2020-02-24: 2.5 mg via RESPIRATORY_TRACT
  Filled 2020-02-24: qty 3

## 2020-02-24 MED ORDER — TRAMADOL HCL 50 MG PO TABS
50.0000 mg | ORAL_TABLET | Freq: Once | ORAL | Status: AC
  Administered 2020-02-24: 50 mg via ORAL
  Filled 2020-02-24: qty 1

## 2020-02-24 MED ORDER — ENOXAPARIN SODIUM 40 MG/0.4ML ~~LOC~~ SOLN
40.0000 mg | SUBCUTANEOUS | Status: DC
  Administered 2020-02-24 – 2020-02-25 (×2): 40 mg via SUBCUTANEOUS
  Filled 2020-02-24 (×2): qty 0.4

## 2020-02-24 MED ORDER — GUAIFENESIN ER 600 MG PO TB12
1200.0000 mg | ORAL_TABLET | Freq: Two times a day (BID) | ORAL | Status: DC
  Administered 2020-02-24 – 2020-02-26 (×5): 1200 mg via ORAL
  Filled 2020-02-24 (×5): qty 2

## 2020-02-24 MED ORDER — PANTOPRAZOLE SODIUM 40 MG PO TBEC
40.0000 mg | DELAYED_RELEASE_TABLET | Freq: Every day | ORAL | Status: DC
  Administered 2020-02-24 – 2020-02-26 (×3): 40 mg via ORAL
  Filled 2020-02-24 (×3): qty 1

## 2020-02-24 NOTE — Progress Notes (Signed)
  Speech Language Pathology Treatment: Dysphagia  Patient Details Name: Yvonne Scott MRN: [id number] DOB: [birth year] Today's Date: 02/24/2020 Time: 9476-5465 SLP Time Calculation (min) (ACUTE ONLY): 33.23 min  Assessment / Plan / Recommendation Clinical Impression  Swallowing reassessed through Dysphagia Tx session this am. Pt was alert and pleasant. Congestion much improved. Pt has a weak occasional cough without Po's. No s/s of aspiration with any tested consistencies today. Oral transtit delay with solids but Pt was able to clear oral cavity adequately. Vocal qualtiy remained clear. Pt much improved since swallow evaluation. Will order Dys 3 diet with thin liquids. Try meds one at time with water. If any s/s of aspiration, give whole with applesauce. ST to follow up with toleration of diet and alter as needed.    HPI HPI: Per admitting H&P" Yvonne Scott is a 59 y.o. female with medical history significant for hypertension who presents to the ER with a 1 day complaint of nausea vomiting, shortness of breath and generalized weakness.  Has mild upper abdominal pain, denies diarrhea, fever or chills.  Emesis is nonbilious nonbloody and without coffee grounds.  Denies dysuria.  Denies cough or chest pain. Patient states that for some time now which she is unable to clarify she has been feeling weak and thought it was because of stress. She mentions that she has a lump in her right breast which she has had for several years but lately it became painful. She denies fevers at home."      SLP Plan  Continue with current plan of care       Recommendations  Diet recommendations: Dysphagia 3 (mechanical soft) Liquids provided via: Cup;Straw Medication Administration: Whole meds with liquid Supervision: Patient able to self feed Compensations: Small sips/bites;Slow rate;Minimize environmental distractions Postural Changes and/or Swallow Maneuvers: Seated upright 90 degrees;Upright 30-60 min  after meal                Follow up Recommendations: None SLP Visit Diagnosis: Dysphagia, oropharyngeal phase (R13.12) Plan: Continue with current plan of care       GO                [name] 02/24/2020, 9:18 AM

## 2020-02-24 NOTE — Progress Notes (Signed)
VAST consulted to obtain IV access. Assessed pt's left arm utilizing ultrasound. Attempted IV access utilizing [place] unsuccessfully. Pt's right arm edematous and ecchymotic from IV infiltration and sticks.  Pt not willing to have IV placed at this time. She stated she might allow someone else to attempt tomorrow morning.  Informed unit RN of outcome and patient's comments.

## 2020-02-24 NOTE — Progress Notes (Signed)
PROGRESS NOTE  Yvonne Scott KDT:[id number] DOB: Oct 05, 1960 DOA: 02/20/2020 PCP: Towanda Malkin, MD  HPI/Recap of past 24 hours:  Yvonne Scott is a 59 y.o. female with medical history significant for hypertension who presented to the ER with a 1 day complaint of nausea vomiting, shortness of breath and generalized weakness along with mild upper abdominal pain.  ED Course: On arrival, afebrile, BP 119/89, pulse 105, tachypneic with respirations 24 with O2 sat 100% on room air.  Blood work significant [place] WBC of 99833, hemoglobin 15.8, glucose 268, anion gap unable to calculate.  Creatinine 1.7 and elevation of liver enzymes with AST 57, ALT 75 total bilirubin 2.7.  Lipase 71. venous pH 7 EKG  Sinus tachycardia at 108 with no acute ST-T wave changes Imaging: Chest x-ray clear.  RUQ sono preliminary read with no acute findings Patient given IV fluid bolus and started on an insulin infusion.    Patient was admitted under hospitalist service.   Assessment/Plan: Principal Problem:   DKA (diabetic ketoacidosis) ([place]) Active Problems:   GERD (gastroesophageal reflux disease)   Essential hypertension, benign   Elevated LFTs   AKI (acute kidney injury) ([place])   Leukocytosis   Lump of right breast   Metabolic acidosis   Starvation ketoacidosis: Initially presented with nausea vomiting weakness with elevated sugar in the 200s venous pH of 7, serum bicarb less than 7 and large anion gap.  A1c 4.5 with no prior history of diabetes She was treated with IV insulin and IV hydration. Patient has a history of acetone ingestion. Acetone level is pending.  Ketoacidosis has resolved.  Dysphagia: Much better now that patient is more alert.  Seen by SLP on regular basis.  Now advanced to dysphagia 3 diet.  Acute metabolic encephalopathy, unclear etiology at this point: She is now fully alert and oriented.  Follow acetone level.  Alcohol use disorder with concern for alcohol withdrawal:  Patient has had visual hallucinations while in the ED. Placed on CIWA protocol.  No clear signs of alcohol withdrawal at this point in time.  AKI: Resolved.  Hypokalemia: Resolved.  Severe hypophosphatemia in the setting of starvation Phosphate over 2 today.  Will receive another dose today.  Will recheck in the morning.  Hypomagnesemia Resolved.  Nausea and vomiting Continue antiemetic.  Meningioma: MRI brain without contrast shows possible small meningioma.  Discussed with on-call neurosurgery Dr. Lacinda Axon who recommends outpatient follow-up and inpatient MRI brain with contrast.   GERD: Resume home dose of PPI per patient's request.  Code Status: Full code  Family Communication: None at bedside  Disposition Plan: Discharge to SNF once placement arranged.  TOC on board.   Consultants:  None  Procedures:  None  Antimicrobials:  None  DVT prophylaxis: Subcu Lovenox daily  Status is: Inpatient    Dispo:  Patient From: Home  Planned Disposition: Home vs snf  Expected discharge date: 02/24/2020  Medically stable for discharge: No, ongoing management of starvation ketoacidosis and significant electrolyte abnormality.   Subjective: Patient seen and examined.  Patient today is fully alert and oriented.  Complains of generalized weakness, headache.  No other complaint.  Objective: Vitals:   02/23/20 1607 02/23/20 1937 02/24/20 0419 02/24/20 0730  BP: 122/83 120/80 121/75 (!) 140/99  Pulse: 100 (!) 105 (!) 101 (!) 106  Resp: 18 16 16 17   Temp: 98.8 F (37.1 C) 97.9 F (36.6 C) 98.7 F (37.1 C) 98.7 F (37.1 C)  TempSrc: Oral Oral Oral Oral  SpO2: 98% 98% 96% 94%  Weight:   58.9 kg   Height:        Intake/Output Summary (Last 24 hours) at 02/24/2020 1057 Last data filed at 02/24/2020 1027 Gross per 24 hour  Intake 0 ml  Output 800 ml  Net -800 ml   Filed Weights   02/20/20 1403 02/22/20 2116 02/24/20 0419  Weight: 54.4 kg 61 kg 58.9 kg     Exam:  General exam: Appears calm and comfortable  Respiratory system: Clear to auscultation. Respiratory effort normal. Cardiovascular system: S1 & S2 heard, RRR. No JVD, murmurs, rubs, gallops or clicks. No pedal edema. Gastrointestinal system: Abdomen is nondistended, soft and nontender. No organomegaly or masses felt. Normal bowel sounds heard. Central nervous system: Alert and oriented. No focal neurological deficits. Extremities: Symmetric 5 x 5 power. Skin: No rashes, lesions or ulcers.  Psychiatry: Judgement and insight appear normal. Mood & affect appropriate.     Data Reviewed: CBC: Recent Labs  Lab 02/20/20 1408 02/21/20 0209 02/21/20 1308 02/23/20 0651 02/24/20 0534  WBC 15.8* 11.6* 8.7 5.4 5.3  NEUTROABS  --   --   --  3.3 3.3  HGB 15.8* 14.5 12.5 11.6* 11.1*  HCT 49.3* 43.9 35.6* 32.7* 32.8*  MCV 101.0* 98.4 92.7 92.1 95.1  PLT 336 211 149* 143* 269   Basic Metabolic Panel: Recent Labs  Lab 02/21/20 1308 02/21/20 1901 02/22/20 0610 02/23/20 0651 02/24/20 0534  NA 136 137 136 138 141  K 3.3* 3.3* 3.0* 3.9 4.2  CL 108 104 101 103 109  CO2 13* 18* 23 25 22   GLUCOSE 114* 134* 126* 104* 99  BUN 42* 32* 17 6 5*  CREATININE 1.25* 0.94 0.63 0.42* 0.51  CALCIUM 9.0 9.3 8.8* 8.0* 7.9*  MG 1.6* 3.3* 2.5* 1.9 1.9  PHOS <1.0* <1.0* 1.0* 2.4* 2.5   GFR: Estimated Creatinine Clearance: 64.6 mL/min (by C-G formula based on SCr of 0.51 mg/dL). Liver Function Tests: Recent Labs  Lab 02/20/20 1408 02/23/20 0651 02/24/20 0534  AST 57* 45* 52*  ALT 75* 39 44  ALKPHOS 90 57 62  BILITOT 2.7* 0.8 0.7  PROT 9.3* 6.1* 5.9*  ALBUMIN 5.5* 3.3* 3.1*   Recent Labs  Lab 02/20/20 1408  LIPASE 71*   No results for input(s): AMMONIA in the last 168 hours. Coagulation Profile: No results for input(s): INR, PROTIME in the last 168 hours. Cardiac Enzymes: No results for input(s): CKTOTAL, CKMB, CKMBINDEX, TROPONINI in the last 168 hours. BNP (last 3  results) No results for input(s): PROBNP in the last 8760 hours. HbA1C: No results for input(s): HGBA1C in the last 72 hours. CBG: Recent Labs  Lab 02/23/20 0742 02/23/20 1119 02/23/20 1604 02/23/20 2050 02/24/20 0731  GLUCAP 92 94 106* 96 101*   Lipid Profile: No results for input(s): CHOL, HDL, LDLCALC, TRIG, CHOLHDL, LDLDIRECT in the last 72 hours. Thyroid Function Tests: No results for input(s): TSH, T4TOTAL, FREET4, T3FREE, THYROIDAB in the last 72 hours. Anemia Panel: No results for input(s): VITAMINB12, FOLATE, FERRITIN, TIBC, IRON, RETICCTPCT in the last 72 hours. Urine analysis:    Component Value Date/Time   COLORURINE YELLOW (A) 02/21/2020 0641   APPEARANCEUR HAZY (A) 02/21/2020 0641   APPEARANCEUR Hazy 02/16/2014 0724   LABSPEC 1.015 02/21/2020 0641   LABSPEC 1.014 02/16/2014 0724   PHURINE 5.0 02/21/2020 0641   GLUCOSEU NEGATIVE 02/21/2020 0641   GLUCOSEU Negative 02/16/2014 0724   HGBUR LARGE (A) 02/21/2020 [place] NEGATIVE 02/21/2020 4854  BILIRUBINUR Negative 02/16/2014 0724   KETONESUR 80 (A) 02/21/2020 0641   PROTEINUR 100 (A) 02/21/2020 0641   NITRITE NEGATIVE 02/21/2020 0641   LEUKOCYTESUR NEGATIVE 02/21/2020 0641   LEUKOCYTESUR Negative 02/16/2014 0724   Sepsis Labs: @LABRCNTIP (procalcitonin:4,lacticidven:4)  ) Recent Results (from the past 240 hour(s))  Resp Panel by RT-PCR (Flu A&B, Covid) Nasopharyngeal Swab     Status: None   Collection Time: 02/21/20 12:30 AM   Specimen: Nasopharyngeal Swab; Nasopharyngeal(NP) swabs in vial transport medium  Result Value Ref Range Status   SARS Coronavirus 2 by RT PCR NEGATIVE NEGATIVE Final    Comment: (NOTE) SARS-CoV-2 target nucleic acids are NOT DETECTED.  The SARS-CoV-2 RNA is generally detectable in upper respiratory specimens during the acute phase of infection. The lowest concentration of SARS-CoV-2 viral copies this assay can detect is 138 copies/mL. A negative result does not  preclude SARS-Cov-2 infection and should not be used as the sole basis for treatment or other patient management decisions. A negative result may occur with  improper specimen collection/handling, submission of specimen other than nasopharyngeal swab, presence of viral mutation(s) within the areas targeted by this assay, and inadequate number of viral copies(<138 copies/mL). A negative result must be combined with clinical observations, patient history, and epidemiological information. The expected result is Negative.  Fact Sheet for Patients:  EntrepreneurPulse.com.au  Fact Sheet for Healthcare Providers:  IncredibleEmployment.be  This test is no t yet approved or cleared by the [place] FDA and  has been authorized for detection and/or diagnosis of SARS-CoV-2 by FDA under an Emergency Use Authorization (EUA). This EUA will remain  in effect (meaning this test can be used) for the duration of the COVID-19 declaration under Section 564(b)(1) of the Act, 21 U.S.C.section 360bbb-3(b)(1), unless the authorization is terminated  or revoked sooner.       Influenza A by PCR NEGATIVE NEGATIVE Final   Influenza B by PCR NEGATIVE NEGATIVE Final    Comment: (NOTE) The Xpert Xpress SARS-CoV-2/FLU/RSV plus assay is intended as an aid in the diagnosis of influenza from Nasopharyngeal swab specimens and should not be used as a sole basis for treatment. Nasal washings and aspirates are unacceptable for Xpert Xpress SARS-CoV-2/FLU/RSV testing.  Fact Sheet for Patients: EntrepreneurPulse.com.au  Fact Sheet for Healthcare Providers: IncredibleEmployment.be  This test is not yet approved or cleared by the [place] FDA and has been authorized for detection and/or diagnosis of SARS-CoV-2 by FDA under an Emergency Use Authorization (EUA). This EUA will remain in effect (meaning this test can be used) for the  duration of the COVID-19 declaration under Section 564(b)(1) of the Act, 21 U.S.C. section 360bbb-3(b)(1), unless the authorization is terminated or revoked.  Performed at [facility], [address] [place], [place] [address]      Studies: MR BRAIN WO CONTRAST  Result Date: 02/23/2020 CLINICAL DATA:  59 year old female with hypertension, weakness, nausea vomiting. Plain head CT yesterday suggestive of small 7 mm right vertex meningioma. EXAM: MRI HEAD WITHOUT CONTRAST TECHNIQUE: Multiplanar, multiecho pulse sequences of the brain and surrounding structures were obtained without intravenous contrast. COMPARISON:  Head CT without contrast 02/22/2020 FINDINGS: The examination had to be discontinued prior to completion by patient request. No contrast was administered. Provided axial FLAIR imaging is substantially motion degraded. Major intracranial vascular flow voids are preserved. No convincing restricted diffusion to suggest acute infarction. No ventriculomegaly. No midline shift or mass effect. Normal basilar cisterns. Negative pituitary and cervicomedullary junction. Small circumscribed T2 hypointense round 9  mm lesion along the right superior parietal convexity near midline (series 10 image 21) corresponds to the CT finding yesterday. This is subtle but isointense to cortical gray matter on sagittal T1 (series 9, image 9). The lesion does not have conspicuous diffusion. There is no evidence of recent intracranial hemorrhage. There is patchy, confluent bilateral cerebral white matter T2 and FLAIR hyperintensity. Deep gray nuclei, brainstem and cerebellum appear to remain normal. Negative for age visible cervical spine. Visualized bone marrow signal is within normal limits. Orbits and paranasal sinuses are negative. Mastoids are clear. Grossly normal visible internal auditory structures. Trace retained secretions in the nasopharynx. IMPRESSION: 1. Truncated exam by patient request, with  some portions motion degraded. No contrast was administered. 2. No evidence of acute infarct or acute intracranial abnormality. 3. Small 9 mm right superior parietal convexity extra-axial lesion identified and most likely either small benign meningioma or simply calcified dural thickening. 4. Moderate for age cerebral white matter signal changes are nonspecific but most commonly due to chronic small vessel disease. Electronically Signed   By: Genevie Ann M.D.   On: 02/23/2020 13:15    Scheduled Meds: . folic acid  1 mg Oral Daily  . insulin aspart  0-9 Units Subcutaneous TID WC  . multivitamin with minerals  1 tablet Oral Daily  . thiamine  100 mg Oral Daily   Or  . thiamine  100 mg Intravenous Daily    Continuous Infusions: . 0.9 % NaCl with KCl 40 mEq / L 125 mL/hr at 02/23/20 1139     LOS: 3 days   Darliss Cheney, MD [facility]  If 7PM-7AM, please contact night-coverage [email or web address] 02/24/2020, 10:57 AM

## 2020-02-24 NOTE — Progress Notes (Signed)
Patient agreeable for [facility] "Yvonne Scott", to be updated [phone], [phone]. Family/friend also agreeable to assist patient once discharged. Password placed on account by patient.

## 2020-02-25 LAB — COMPREHENSIVE METABOLIC PANEL
ALT: 51 U/L — ABNORMAL HIGH (ref 0–44)
AST: 77 U/L — ABNORMAL HIGH (ref 15–41)
Albumin: 3.2 g/dL — ABNORMAL LOW (ref 3.5–5.0)
Alkaline Phosphatase: 67 U/L (ref 38–126)
Anion gap: 9 (ref 5–15)
BUN: <5 mg/dL — ABNORMAL LOW (ref 6–20)
CO2: 23 mmol/L (ref 22–32)
Calcium: 8.5 mg/dL — ABNORMAL LOW (ref 8.9–10.3)
Chloride: 103 mmol/L (ref 98–111)
Creatinine, Ser: 0.44 mg/dL (ref 0.44–1.00)
GFR, Estimated: >60 mL/min (ref >60)
Glucose, Bld: 97 mg/dL (ref 70–99)
Potassium: 3.7 mmol/L (ref 3.5–5.1)
Sodium: 135 mmol/L (ref 135–145)
Total Bilirubin: 0.5 mg/dL (ref 0.3–1.2)
Total Protein: 6.2 g/dL — ABNORMAL LOW (ref 6.5–8.1)

## 2020-02-25 LAB — GLUCOSE, CAPILLARY
Glucose-Capillary: 95 mg/dL (ref 70–99)
Glucose-Capillary: 99 mg/dL (ref 70–99)
Glucose-Capillary: 90 mg/dL (ref 70–99)
Glucose-Capillary: 123 mg/dL — ABNORMAL HIGH (ref 70–99)
Glucose-Capillary: 96 mg/dL (ref 70–99)

## 2020-02-25 LAB — MAGNESIUM: Magnesium: 1.7 mg/dL (ref 1.7–2.4)

## 2020-02-25 NOTE — Progress Notes (Signed)
Occupational Therapy Treatment Patient Details Name: Yvonne Scott MRN: [id number] DOB: 1961-03-06 Today's Date: 02/25/2020    History of present illness Patient is a 59 y.o. female with medical history significant for hypertension who presents to the ER with a one day complaint of nausea vomiting, shortness of breath and generalized weakness. Starvation ketoacidosis, Dysphagia/possible aspiration pneumonia, Acute metabolic encephalopathy with unclear etiology, Alcohol use disorder with concern for alcohol withdrawal and placed on CIWA protocol, electrolyte abnormalit. Head CT shows small hyperdense dural based mass measuring 0.7 cm in the  posterior parietal lobe, most likely a meningioma in the absence of  trauma.   OT comments  Upon entering the room, pt having just finished with PT intervention and agreeable to OT session. Pt does endorse feeling fatigued but motivated to perform self care tasks this session. Pt sitting on EOB for bathing tasks with increased time to initiate with min cuing. Pt bathing self with set up A to obtain all needed items. Standing balance with min guard while performing peri hygiene for several minutes. Pt needing min cuing for safety awareness. Pt donning hospital gown with set up A and returning to supine with supervision overall. Pt making progress towards goals. All needs within reach. Pt continues to benefit from OT intervention.   Follow Up Recommendations  Home health OT;Supervision - Intermittent    Equipment Recommendations  None recommended by OT       Precautions / Restrictions Precautions Precautions: Fall Restrictions Weight Bearing Restrictions: No       Mobility Bed Mobility Overal bed mobility: Needs Assistance Bed Mobility: Supine to Sit;Sit to Supine     Supine to sit: Supervision;HOB elevated Sit to supine: Supervision;HOB elevated   General bed mobility comments: verbal cues for task  Transfers Overall transfer level: Needs  assistance Equipment used: 1 person hand held assist Transfers: Sit to/from Stand Sit to Stand: Min guard         General transfer comment: min guard for balance during peri hygiene in standing    Balance Overall balance assessment: Needs assistance Sitting-balance support: Feet supported Sitting balance-Leahy Scale: Good Sitting balance - Comments: no LOB in sitting   Standing balance support: Single extremity supported;During functional activity Standing balance-Leahy Scale: Good Standing balance comment: no LOB during ambulation with +1 UE support                           ADL either performed or assessed with clinical judgement   ADL Overall ADL's : Needs assistance/impaired         Upper Body Bathing: Supervision/ safety;Set up;Sitting   Lower Body Bathing: Min guard;Sit to/from stand   Upper Body Dressing : Set up;Sitting   Lower Body Dressing: Sitting/lateral leans;Supervision/safety                       Vision Baseline Vision/History: No visual deficits            Cognition Arousal/Alertness: Awake/alert Behavior During Therapy: WFL for tasks assessed/performed Overall Cognitive Status: No family/caregiver present to determine baseline cognitive functioning Area of Impairment: Safety/judgement                               General Comments: Pt is A and O x 3. somewhat confused about situation with poor insight of deficits. she was cooperative and converstational throughout.  Pertinent Vitals/ Pain       Pain Assessment: Faces Faces Pain Scale: No hurt         Frequency  Min 1X/week        Progress Toward Goals  OT Goals(current goals can now be found in the care plan section)  Progress towards OT goals: Progressing toward goals  Acute Rehab OT Goals Patient Stated Goal: to go home OT Goal Formulation: With patient Time For Goal Achievement: 03/08/20 Potential to Achieve Goals:  Good  Plan Discharge plan remains appropriate    Co-evaluation        PT goals addressed during session: Mobility/safety with mobility        AM-PAC OT "6 Clicks" Daily Activity     Outcome Measure   Help from another person eating meals?: None Help from another person taking care of personal grooming?: A Little Help from another person toileting, which includes using toliet, bedpan, or urinal?: A Little Help from another person bathing (including washing, rinsing, drying)?: A Little Help from another person to put on and taking off regular upper body clothing?: A Little Help from another person to put on and taking off regular lower body clothing?: A Little 6 Click Score: 19    End of Session    OT Visit Diagnosis: Other abnormalities of gait and mobility (R26.89)   Activity Tolerance Patient tolerated treatment well   Patient Left in bed;with call bell/phone within reach;with bed alarm set   Nurse Communication Mobility status        Time: 5809-9833 OT Time Calculation (min): 25 min  Charges: OT General Charges $OT Visit: 1 Visit OT Treatments $Self Care/Home Management : 23-37 mins  Darleen Crocker, MS, OTR/L , CBIS ascom [phone]  02/25/20, 4:43 PM

## 2020-02-25 NOTE — Progress Notes (Signed)
PROGRESS NOTE  [name]:[id number] DOB: 1960/11/23 DOA: 02/20/2020 PCP: Towanda Malkin, MD  HPI/Recap of past 24 hours:  Yvonne Scott is a 59 y.o. female with medical history significant for hypertension who presented to the ER with a 1 day complaint of nausea vomiting, shortness of breath and generalized weakness along with mild upper abdominal pain.  ED Course: On arrival, afebrile, BP 119/89, pulse 105, tachypneic with respirations 24 with O2 sat 100% on room air.  Blood work significant [place] 546[address], hemoglobin 15.8, glucose 268, anion gap unable to calculate.  Creatinine 1.7 and elevation of liver enzymes with AST 57, ALT 75 total bilirubin 2.7.  Lipase 71. venous pH 7 EKG  Sinus tachycardia at 108 with no acute ST-T wave changes Imaging: Chest x-ray clear.  RUQ sono preliminary read with no acute findings Patient given IV fluid bolus and started on an insulin infusion.    Patient was admitted under hospitalist service.   Assessment/Plan: Principal Problem:   DKA (diabetic ketoacidosis) ([place]) Active Problems:   GERD (gastroesophageal reflux disease)   Essential hypertension, benign   Elevated LFTs   AKI (acute kidney injury) ([place])   Leukocytosis   Lump of right breast   Metabolic acidosis   Starvation ketoacidosis: Initially presented with nausea vomiting weakness with elevated sugar in the 200s venous pH of 7, serum bicarb less than 7 and large anion gap.  A1c 4.5 with no prior history of diabetes She was treated with IV insulin and IV hydration. Patient has a history of acetone ingestion. Acetone level is minimally elevated.  Ketoacidosis has resolved.  Dysphagia: Much better now that patient is more alert.  Seen by SLP on regular basis.  Now advanced to dysphagia 3 diet.  Acute metabolic encephalopathy, unclear etiology at this point: She is now fully alert and oriented.  Follow acetone level.  Alcohol use disorder with concern for alcohol  withdrawal: Patient has had visual hallucinations while in the ED. Placed on CIWA protocol.  No clear signs of alcohol withdrawal at this point in time.  AKI: Resolved.  Hypokalemia: Resolved.  Severe hypophosphatemia in the setting of starvation Resolved.  Hypomagnesemia Resolved.  Nausea and vomiting Resolved.  Meningioma: MRI brain with contrast shows 8 mm small meningioma.  Discussed with on-call neurosurgery Dr. Lacinda Axon who recommends outpatient follow-up.  GERD: Continue PPI  Code Status: Full code  Family Communication: None at bedside  Disposition Plan: Unsure.  PT initially recommended SNF however per TOC, patient does not have insurance so unable to place in SNF.  Patient tells me today that she was leaving with her boyfriend in his father's house who unfortunately died the day or day before she was admitted.  She has nowhere to go.  TOC has been informed.   Consultants:  None  Procedures:  None  Antimicrobials:  None  DVT prophylaxis: Subcu Lovenox daily  Status is: Inpatient    Dispo:  Patient From: Home  Planned Disposition: Home with Health Care Svc vs snf  Expected discharge date: 02/24/2020  Medically stable for discharge: No, due to unsafe discharge plan   Subjective: Patient seen and examined.  She has no complaints.  She is alert and oriented.  Objective: Vitals:   02/24/20 1951 02/25/20 0402 02/25/20 0730 02/25/20 1147  BP: (!) 147/90 138/88 135/88 136/82  Pulse: (!) 106 (!) 108 100 96  Resp: 16 17 19 19   Temp: 99.7 F (37.6 C) 98.5 F (36.9 C) 98.5 F (  36.9 C) 99.1 F (37.3 C)  TempSrc: Oral  Oral Oral  SpO2: 95% 97% 97% 95%  Weight:  62.8 kg    Height:        Intake/Output Summary (Last 24 hours) at 02/25/2020 1441 Last data filed at 02/25/2020 1300 Gross per 24 hour  Intake 0 ml  Output 600 ml  Net -600 ml   Filed Weights   02/22/20 2116 02/24/20 0419 02/25/20 0402  Weight: 61 kg 58.9 kg 62.8 kg    Exam:  General  exam: Appears calm and comfortable  Respiratory system: Clear to auscultation. Respiratory effort normal. Cardiovascular system: S1 & S2 heard, RRR. No JVD, murmurs, rubs, gallops or clicks. No pedal edema. Gastrointestinal system: Abdomen is nondistended, soft and nontender. No organomegaly or masses felt. Normal bowel sounds heard. Central nervous system: Alert and oriented. No focal neurological deficits. Extremities: Symmetric 5 x 5 power. Skin: No rashes, lesions or ulcers.  Psychiatry: Judgement and insight appear poor. Mood & affect flat  Data Reviewed: CBC: Recent Labs  Lab 02/20/20 1408 02/21/20 0209 02/21/20 1308 02/23/20 0651 02/24/20 0534  WBC 15.8* 11.6* 8.7 5.4 5.3  NEUTROABS  --   --   --  3.3 3.3  HGB 15.8* 14.5 12.5 11.6* 11.1*  HCT 49.3* 43.9 35.6* 32.7* 32.8*  MCV 101.0* 98.4 92.7 92.1 95.1  PLT 336 211 149* 143* 222   Basic Metabolic Panel: Recent Labs  Lab 02/21/20 1308 02/21/20 1901 02/22/20 0610 02/23/20 0651 02/24/20 0534 02/25/20 0505  NA 136 137 136 138 141 135  K 3.3* 3.3* 3.0* 3.9 4.2 3.7  CL 108 104 101 103 109 103  CO2 13* 18* 23 25 22 23   GLUCOSE 114* 134* 126* 104* 99 97  BUN 42* 32* 17 6 5* <5*  CREATININE 1.25* 0.94 0.63 0.42* 0.51 0.44  CALCIUM 9.0 9.3 8.8* 8.0* 7.9* 8.5*  MG 1.6* 3.3* 2.5* 1.9 1.9 1.7  PHOS <1.0* <1.0* 1.0* 2.4* 2.5  --    GFR: Estimated Creatinine Clearance: 64.6 mL/min (by C-G formula based on SCr of 0.44 mg/dL). Liver Function Tests: Recent Labs  Lab 02/20/20 1408 02/23/20 0651 02/24/20 0534 02/25/20 0505  AST 57* 45* 52* 77*  ALT 75* 39 44 51*  ALKPHOS 90 57 62 67  BILITOT 2.7* 0.8 0.7 0.5  PROT 9.3* 6.1* 5.9* 6.2*  ALBUMIN 5.5* 3.3* 3.1* 3.2*   Recent Labs  Lab 02/20/20 1408  LIPASE 71*   No results for input(s): AMMONIA in the last 168 hours. Coagulation Profile: No results for input(s): INR, PROTIME in the last 168 hours. Cardiac Enzymes: No results for input(s): CKTOTAL, CKMB, CKMBINDEX,  TROPONINI in the last 168 hours. BNP (last 3 results) No results for input(s): PROBNP in the last 8760 hours. HbA1C: No results for input(s): HGBA1C in the last 72 hours. CBG: Recent Labs  Lab 02/24/20 1128 02/24/20 1642 02/24/20 2045 02/25/20 0757 02/25/20 1148  GLUCAP 103* 102* 101* 95 99   Lipid Profile: No results for input(s): CHOL, HDL, LDLCALC, TRIG, CHOLHDL, LDLDIRECT in the last 72 hours. Thyroid Function Tests: No results for input(s): TSH, T4TOTAL, FREET4, T3FREE, THYROIDAB in the last 72 hours. Anemia Panel: No results for input(s): VITAMINB12, FOLATE, FERRITIN, TIBC, IRON, RETICCTPCT in the last 72 hours. Urine analysis:    Component Value Date/Time   COLORURINE YELLOW (A) 02/21/2020 0641   APPEARANCEUR HAZY (A) 02/21/2020 0641   APPEARANCEUR Hazy 02/16/2014 0724   LABSPEC 1.015 02/21/2020 0641   LABSPEC 1.014 02/16/2014  [place] 5.0 02/21/2020 0641   GLUCOSEU NEGATIVE 02/21/2020 0641   GLUCOSEU Negative 02/16/2014 0724   HGBUR LARGE (A) 02/21/2020 0641   BILIRUBINUR NEGATIVE 02/21/2020 0641   BILIRUBINUR Negative 02/16/2014 0724   KETONESUR 80 (A) 02/21/2020 0641   PROTEINUR 100 (A) 02/21/2020 0641   NITRITE NEGATIVE 02/21/2020 0641   LEUKOCYTESUR NEGATIVE 02/21/2020 0641   LEUKOCYTESUR Negative 02/16/2014 0724   Sepsis Labs: @LABRCNTIP (procalcitonin:4,lacticidven:4)  ) Recent Results (from the past 240 hour(s))  Resp Panel by RT-PCR (Flu A&B, Covid) Nasopharyngeal Swab     Status: None   Collection Time: 02/21/20 12:30 AM   Specimen: Nasopharyngeal Swab; Nasopharyngeal(NP) swabs in vial transport medium  Result Value Ref Range Status   SARS Coronavirus 2 by RT PCR NEGATIVE NEGATIVE Final    Comment: (NOTE) SARS-CoV-2 target nucleic acids are NOT DETECTED.  The SARS-CoV-2 RNA is generally detectable in upper respiratory specimens during the acute phase of infection. The lowest concentration of SARS-CoV-2 viral copies this assay can detect  is 138 copies/mL. A negative result does not preclude SARS-Cov-2 infection and should not be used as the sole basis for treatment or other patient management decisions. A negative result may occur with  improper specimen collection/handling, submission of specimen other than nasopharyngeal swab, presence of viral mutation(s) within the areas targeted by this assay, and inadequate number of viral copies(<138 copies/mL). A negative result must be combined with clinical observations, patient history, and epidemiological information. The expected result is Negative.  Fact Sheet for Patients:  EntrepreneurPulse.com.au  Fact Sheet for Healthcare Providers:  IncredibleEmployment.be  This test is no t yet approved or cleared by the [place] FDA and  has been authorized for detection and/or diagnosis of SARS-CoV-2 by FDA under an Emergency Use Authorization (EUA). This EUA will remain  in effect (meaning this test can be used) for the duration of the COVID-19 declaration under Section 564(b)(1) of the Act, 21 U.S.C.section 360bbb-3(b)(1), unless the authorization is terminated  or revoked sooner.       Influenza A by PCR NEGATIVE NEGATIVE Final   Influenza B by PCR NEGATIVE NEGATIVE Final    Comment: (NOTE) The Xpert Xpress SARS-CoV-2/FLU/RSV plus assay is intended as an aid in the diagnosis of influenza from Nasopharyngeal swab specimens and should not be used as a sole basis for treatment. Nasal washings and aspirates are unacceptable for Xpert Xpress SARS-CoV-2/FLU/RSV testing.  Fact Sheet for Patients: EntrepreneurPulse.com.au  Fact Sheet for Healthcare Providers: IncredibleEmployment.be  This test is not yet approved or cleared by the [place] FDA and has been authorized for detection and/or diagnosis of SARS-CoV-2 by FDA under an Emergency Use Authorization (EUA). This EUA will remain in effect  (meaning this test can be used) for the duration of the COVID-19 declaration under Section 564(b)(1) of the Act, 21 U.S.C. section 360bbb-3(b)(1), unless the authorization is terminated or revoked.  Performed at [facility], [address], [place], [place] [address]      Studies: No results found.  Scheduled Meds: . enoxaparin (LOVENOX) injection  40 mg Subcutaneous Q24H  . folic acid  1 mg Oral Daily  . guaiFENesin  1,200 mg Oral BID  . insulin aspart  0-9 Units Subcutaneous TID WC  . multivitamin with minerals  1 tablet Oral Daily  . pantoprazole  40 mg Oral Daily  . thiamine  100 mg Oral Daily   Or  . thiamine  100 mg Intravenous Daily    Continuous Infusions: . 0.9 %  NaCl with KCl 40 mEq / L Stopped (02/24/20 1755)     LOS: 4 days   Darliss Cheney, MD [facility]  If 7PM-7AM, please contact night-coverage [email or web address] 02/25/2020, 2:41 PM

## 2020-02-25 NOTE — TOC Initial Note (Signed)
Transition of Care [facility]) - Initial/Assessment Note    Patient Details  Name: Yvonne Scott MRN: [id number] Date of Birth: 1960/09/11  Transition of [facility]) CM/SW Contact:    Kerin Salen, RN Phone Number: 02/25/2020, 3:54 PM  Clinical Narrative: TOC met with patient in room, she reports that her roommate dies of heart disease week ago and the family is trying to sell the house they were living in. However she will be returning to stay until they work out the legal paperwork. Primary care in the [place] clinic, transportation provided by friends. Prescriptions filled at the [facility] in [place]. No family support per patient, friend Simonne Maffucci will pick her up, patient did not have phone contact says he called and spoke with nurse. Patient states her friends provide transportation for shopping and she cooks. TOC will continue to assist with identified discharge needs.                  Expected Discharge Plan: Home/Self Care Barriers to Discharge: Continued Medical Work up   Patient Goals and CMS Choice Patient states their goals for this hospitalization and ongoing recovery are:: Go home, was living with e-boyfriend son, who died week ago from heart failure.   Choice offered to / list presented to : NA  Expected Discharge Plan and Services Expected Discharge Plan: Home/Self Care In-house Referral: Clinical Social Work Discharge Planning Services: NA Post Acute Care Choice: NA Living arrangements for the past 2 months: Single Family Home                 DME Arranged: N/A DME Agency: NA       HH Arranged: NA [place] Agency: NA        Prior Living Arrangements/Services Living arrangements for the past 2 months: [place] Lives with:: Roommate Patient language and need for interpreter reviewed:: Yes Do you feel safe going back to the place where you live?: Yes      Need for Family Participation in Patient Care: No (Comment) Care giver support  system in place?: No (comment)   Criminal Activity/Legal Involvement Pertinent to Current Situation/Hospitalization: No - Comment as needed  Activities of Daily Living Home Assistive Devices/Equipment: Eyeglasses ADL Screening (condition at time of admission) Patient's cognitive ability adequate to safely complete daily activities?: Yes Is the patient deaf or have difficulty hearing?: No Does the patient have difficulty seeing, even when wearing glasses/contacts?: No Does the patient have difficulty concentrating, remembering, or making decisions?: Yes Patient able to express need for assistance with ADLs?: Yes Does the patient have difficulty dressing or bathing?: Yes Independently performs ADLs?: No Dressing (OT): Needs assistance Is this a change from baseline?: Change from baseline, expected to last <3days Bathing: Needs assistance Is this a change from baseline?: Change from baseline, expected to last <3 days Toileting: Needs assistance In/Out Bed: Needs assistance Is this a change from baseline?: Change from baseline, expected to last <3 days Walks in Home: Needs assistance Is this a change from baseline?: Change from baseline, expected to last <3 days Does the patient have difficulty walking or climbing stairs?: Yes Weakness of Legs: Both Weakness of Arms/Hands: Both  Permission Sought/Granted                  Emotional Assessment Appearance:: Appears stated age Attitude/Demeanor/Rapport: Unable to Assess Affect (typically observed): Agitated,Grieving (Roommate died week ago.) Orientation: : Oriented to Self,Oriented to Place,Oriented to  Time,Oriented to Situation Alcohol / Substance Use:  Not Applicable Psych Involvement: No (comment)  Admission diagnosis:  Aspiration pneumonia ([place]) [N98.9] Metabolic acidosis [Q11.9] DKA (diabetic ketoacidosis) ([place]) [E11.10] Diabetes mellitus, new onset ([place]) [E11.9] Diabetic ketoacidosis without coma associated with other  specified diabetes mellitus ([place]) [E13.10] Patient Active Problem List   Diagnosis Date Noted  . Metabolic acidosis 41/74/0814  . DKA (diabetic ketoacidosis) ([place]) 02/21/2020  . Elevated LFTs 02/21/2020  . AKI (acute kidney injury) ([place]) 02/21/2020  . Leukocytosis 02/21/2020  . Lump of right breast 02/21/2020  . Hyponatremia 01/30/2017  . Hypochloremia 01/30/2017  . Hypokalemia 01/27/2017  . Hyperglycemia 01/27/2017  . Back pain 12/15/2016  . Chronic lower back pain 12/15/2016  . Elevated ferritin 11/07/2016  . Aortic atherosclerosis ([place]) 10/24/2016  . Fatty liver 10/24/2016  . Medication monitoring encounter 11/19/2015  . GERD (gastroesophageal reflux disease) 11/19/2015  . Tobacco abuse 11/19/2015  . Essential hypertension, benign 11/19/2015   PCP:  Towanda Malkin, MD Pharmacy:   [facility] ([facility]), [place] [address]) [place] [address] Phone: [phone] Fax: [phone]     Social Determinants of Health (SDOH) Interventions    Readmission Risk Interventions No flowsheet data found.

## 2020-02-25 NOTE — Progress Notes (Signed)
Physical Therapy Treatment Patient Details Name: Yvonne Scott MRN: [id number] DOB: 1960/05/13 Today's Date: 02/25/2020    History of Present Illness Patient is a 59 y.o. female with medical history significant for hypertension who presents to the ER with a one day complaint of nausea vomiting, shortness of breath and generalized weakness. Starvation ketoacidosis, Dysphagia/possible aspiration pneumonia, Acute metabolic encephalopathy with unclear etiology, Alcohol use disorder with concern for alcohol withdrawal and placed on CIWA protocol, electrolyte abnormalit. Head CT shows small hyperdense dural based mass measuring 0.7 cm in the  posterior parietal lobe, most likely a meningioma in the absence of  trauma.    PT Comments    Pt was long sitting in bed upon arriving. She agrees to PT session and is cooperative and pleasant throughout. Resting HR in 90s that quickly elevated to low 100s with sitting up EOB. She stood and ambulated ~ 75 ft with HHA +1. No LOB or safety concern however pt does fatigue extremely quickly. Returned to room for seated rest. HR elevated 10 138 bpm during ambulation. Pt is unable to go to rehab at DC per [place] services to improve strength, endurance, and safety with all OOB activity.     Follow Up Recommendations  Home health PT;Other (comment) (HHPT  once HR improves and pt is medically cleared. May be difficulty placement due to insurance)     Equipment Recommendations  Other (comment) (pt reports having all needed equipment at home already)    Recommendations for Other Services       Precautions / Restrictions Precautions Precautions: Fall Restrictions Weight Bearing Restrictions: No    Mobility  Bed Mobility Overal bed mobility: Needs Assistance Bed Mobility: Supine to Sit;Sit to Supine     Supine to sit: Supervision;HOB elevated Sit to supine: Supervision;HOB elevated      Transfers Overall transfer level: Needs  assistance Equipment used: 1 person hand held assist Transfers: Sit to/from Stand Sit to Stand: Min guard         General transfer comment: CGA for safety. recommend use of RW when not working with PT  Ambulation/Gait Ambulation/Gait assistance: Counsellor (Feet): 75 Feet Assistive device: None;1 person hand held assist Gait Pattern/deviations: Step-through pattern Gait velocity: WNL   General Gait Details: pt was limited by fatigue with HR elevation from 101 to 136 during ambulation. sao2 >91 % throughout. pt does endorse fatigue       Balance Overall balance assessment: Needs assistance Sitting-balance support: Feet supported Sitting balance-Leahy Scale: Good Sitting balance - Comments: no LOB in sitting   Standing balance support: Single extremity supported;During functional activity Standing balance-Leahy Scale: Good Standing balance comment: no LOB during ambulation with +1 UE support       Cognition Arousal/Alertness: Awake/alert Behavior During Therapy: Flat affect Overall Cognitive Status: No family/caregiver present to determine baseline cognitive functioning Area of Impairment: Safety/judgement      General Comments: Pt is A and O x 3. somewhat confused about situation with poor insight of deficits. she was cooperative and converstational throughout.             Pertinent Vitals/Pain Pain Assessment: No/denies pain     PT Goals (current goals can now be found in the care plan section) Acute Rehab PT Goals Patient Stated Goal: to go home once the doctors figure out what's wrong with me Progress towards PT goals: Progressing toward goals    Frequency    Min 2X/week      PT  Plan Discharge plan needs to be updated    Co-evaluation     PT goals addressed during session: Mobility/safety with mobility        AM-PAC PT "6 Clicks" Mobility   Outcome Measure  Help needed turning from your back to your side while in a flat bed  without using bedrails?: A Little Help needed moving from lying on your back to sitting on the side of a flat bed without using bedrails?: A Little Help needed moving to and from a bed to a chair (including a wheelchair)?: A Little Help needed standing up from a chair using your arms (e.g., wheelchair or bedside chair)?: A Little Help needed to walk in hospital room?: A Little Help needed climbing 3-5 steps with a railing? : A Little 6 Click Score: 18    End of Session Equipment Utilized During Treatment: Gait belt Activity Tolerance: Patient limited by fatigue Patient left: in bed;with call bell/phone within reach;with bed alarm set Nurse Communication: Mobility status PT Visit Diagnosis: Unsteadiness on feet (R26.81);Muscle weakness (generalized) (M62.81)     Time: 7062-3762 PT Time Calculation (min) (ACUTE ONLY): 23 min  Charges:  $Gait Training: 8-22 mins $Therapeutic Activity: 8-22 mins                     Julaine Fusi PTA 02/25/20, 2:37 PM

## 2020-02-26 DIAGNOSIS — N179 Acute kidney failure, unspecified: Secondary | ICD-10-CM

## 2020-02-26 DIAGNOSIS — E872 Acidosis: Principal | ICD-10-CM

## 2020-02-26 DIAGNOSIS — K219 Gastro-esophageal reflux disease without esophagitis: Secondary | ICD-10-CM

## 2020-02-26 DIAGNOSIS — R7989 Other specified abnormal findings of blood chemistry: Secondary | ICD-10-CM

## 2020-02-26 LAB — GLUCOSE, CAPILLARY
Glucose-Capillary: 102 mg/dL — ABNORMAL HIGH (ref 70–99)
Glucose-Capillary: 86 mg/dL (ref 70–99)

## 2020-02-26 LAB — MAGNESIUM: Magnesium: 1.8 mg/dL (ref 1.7–2.4)

## 2020-02-26 MED ORDER — FOLIC ACID 1 MG PO TABS: 1.0000 mg | ORAL_TABLET | Freq: Every day | ORAL | 0 refills | Status: DC

## 2020-02-26 MED ORDER — ADULT MULTIVITAMIN W/MINERALS CH: 1.0000 | ORAL_TABLET | Freq: Every day | ORAL | 0 refills | Status: DC

## 2020-02-26 MED ORDER — THIAMINE HCL 100 MG PO TABS: 50.0000 mg | ORAL_TABLET | Freq: Every day | ORAL | 0 refills | Status: DC

## 2020-02-26 NOTE — Discharge Summary (Signed)
[facility] NAME: Yvonne Scott    MR#:  [id number]  DATE OF BIRTH:  1960/07/31  DATE OF ADMISSION:  02/20/2020 ADMITTING PHYSICIAN: Darliss Cheney, MD  DATE OF DISCHARGE: 02/26/2020  PRIMARY CARE PHYSICIAN: Towanda Malkin, MD    ADMISSION DIAGNOSIS:  Aspiration pneumonia ([place]) [Y19.5] Metabolic acidosis [K93.2] DKA (diabetic ketoacidosis) ([place]) [E11.10] Diabetes mellitus, new onset ([place]) [E11.9] Diabetic ketoacidosis without coma associated with other specified diabetes mellitus ([place]) [E13.10]  DISCHARGE DIAGNOSIS:    SECONDARY DIAGNOSIS:   Past Medical History:  Diagnosis Date  . Anxiety   . Aortic atherosclerosis ([place]) 10/24/2016  . GERD (gastroesophageal reflux disease)     HOSPITAL COURSE:   Yvonne Beauchesne Johnsonis a 59 y.o.femalewith medical history significant forhypertension who presented to the ER with a 1 day complaint of nausea vomiting, shortness of breath and generalized weakness along with mild upper abdominal pain.  Starvation ketoacidosis: Initially presented with nausea vomiting weakness with elevated sugar in the 200s venous pH of 7, serum bicarb less than 7 and large anion gap.  A1c 4.5 with no prior history of diabetes -Patient has a history of acetone ingestion. Acetone level is minimally elevated.  --Ketoacidosis has resolved. -pt tolerating po diet  Dysphagia: Much better now that patient is more alert.  Seen by SLP on regular basis.  Now advanced to dysphagia 3 diet.  Acute metabolic encephalopathy, unclear etiology at this point:--resolved - She is now fully alert and oriented.   Alcohol use disorder with concern for alcohol withdrawal: - Patient has had visual hallucinations while in the ED. Placed on CIWA protocol for precaution. -  No clear signs of alcohol withdrawal at this point in time.  AKI: Resolved.  Hypokalemia: Resolved.  Severe hypophosphatemia in the setting of  starvation Resolved.  Hypomagnesemia Resolved.  Meningioma: MRI brain with contrast shows 8 mm small meningioma.  Discussed with on-call neurosurgery Dr. Lacinda Axon who recommends outpatient follow-up.  GERD: Continue PPI  Code Status: Full code  Family Communication: None at bedside  Disposition Plan:  please se TOC note for details Pt will d/c to home with friend. [facility] by [facility]  CONSULTS OBTAINED:    DRUG ALLERGIES:  No Known Allergies  DISCHARGE MEDICATIONS:   Allergies as of 02/26/2020   No Known Allergies     Medication List    TAKE these medications   folic acid 1 MG tablet Commonly known as: FOLVITE Take 1 tablet (1 mg total) by mouth daily. Start taking on: February 27, 2020   guaiFENesin 600 MG 12 hr tablet Commonly known as: MUCINEX Take 2 tablets (1,200 mg total) by mouth 2 (two) times daily.   ibuprofen 200 MG tablet Commonly known as: ADVIL Take 200 mg by mouth every 6 (six) hours as needed for headache.   multivitamin with minerals Tabs tablet Take 1 tablet by mouth daily. Start taking on: February 27, 2020   omeprazole 20 MG capsule Commonly known as: PRILOSEC Take 20 mg by mouth daily as needed (reflux).   thiamine 100 MG tablet Take 0.5 tablets (50 mg total) by mouth daily. Start taking on: February 27, 2020       If you experience worsening of your admission symptoms, develop shortness of breath, life threatening emergency, suicidal or homicidal thoughts you must seek medical attention immediately by calling 911 or calling your MD immediately  if symptoms less severe.  You Must read complete instructions/literature along with  all the possible adverse reactions/side effects for all the Medicines you take and that have been prescribed to you. Take any new Medicines after you have completely understood and accept all the possible adverse reactions/side effects.   Please note  You were cared for by a hospitalist during  your hospital stay. If you have any questions about your discharge medications or the care you received while you were in the hospital after you are discharged, you can call the unit and asked to speak with the hospitalist on call if the hospitalist that took care of you is not available. Once you are discharged, your primary care physician will handle any further medical issues. Please note that NO REFILLS for any discharge medications will be authorized once you are discharged, as it is imperative that you return to your primary care physician (or establish a relationship with a primary care physician if you do not have one) for your aftercare needs so that they can reassess your need for medications and monitor your lab values. Today   SUBJECTIVE   My nose is congested. No other complaints. Eating well  VITAL SIGNS:  Blood pressure 120/71, pulse 96, temperature 98 F (36.7 C), temperature source Oral, resp. rate 16, height 5\' 4"  (1.626 m), weight 62.8 kg, last menstrual period 11/19/2002, SpO2 96 %.  I/O:    Intake/Output Summary (Last 24 hours) at 02/26/2020 1419 Last data filed at 02/26/2020 1000 Gross per 24 hour  Intake 240 ml  Output 200 ml  Net 40 ml    PHYSICAL EXAMINATION:  GENERAL:  59 y.o.-year-old patient lying in the bed with no acute distress.  HEENT: Head atraumatic, normocephalic. Oropharynx and nasopharynx clear.  LUNGS: Normal breath sounds bilaterally, no wheezing, rales,rhonchi or crepitation. No use of accessory muscles of respiration.  CARDIOVASCULAR: S1, S2 normal. No murmurs, rubs, or gallops.  ABDOMEN: Soft, non-tender, non-distended. Bowel sounds present. No organomegaly or mass.  EXTREMITIES: No pedal edema, cyanosis, or clubbing.  NEUROLOGIC: Cranial nerves II through XII are intact. Muscle strength 5/5 in all extremities. Sensation intact. Gait not checked.  PSYCHIATRIC:patient is alert and oriented x 3.  SKIN: No obvious rash, lesion, or ulcer.   DATA  REVIEW:   CBC  Recent Labs  Lab 02/24/20 0534  WBC 5.3  HGB 11.1*  HCT 32.8*  PLT 154    Chemistries  Recent Labs  Lab 02/25/20 0505 02/26/20 0421  NA 135  --   K 3.7  --   CL 103  --   CO2 23  --   GLUCOSE 97  --   BUN <5*  --   CREATININE 0.44  --   CALCIUM 8.5*  --   MG 1.7 1.8  AST 77*  --   ALT 51*  --   ALKPHOS 67  --   BILITOT 0.5  --     Microbiology Results   Recent Results (from the past 240 hour(s))  Resp Panel by RT-PCR (Flu A&B, Covid) Nasopharyngeal Swab     Status: None   Collection Time: 02/21/20 12:30 AM   Specimen: Nasopharyngeal Swab; Nasopharyngeal(NP) swabs in vial transport medium  Result Value Ref Range Status   SARS Coronavirus 2 by RT PCR NEGATIVE NEGATIVE Final    Comment: (NOTE) SARS-CoV-2 target nucleic acids are NOT DETECTED.  The SARS-CoV-2 RNA is generally detectable in upper respiratory specimens during the acute phase of infection. The lowest concentration of SARS-CoV-2 viral copies this assay can detect is 138 copies/mL. A negative  result does not preclude SARS-Cov-2 infection and should not be used as the sole basis for treatment or other patient management decisions. A negative result may occur with  improper specimen collection/handling, submission of specimen other than nasopharyngeal swab, presence of viral mutation(s) within the areas targeted by this assay, and inadequate number of viral copies(<138 copies/mL). A negative result must be combined with clinical observations, patient history, and epidemiological information. The expected result is Negative.  Fact Sheet for Patients:  EntrepreneurPulse.com.au  Fact Sheet for Healthcare Providers:  IncredibleEmployment.be  This test is no t yet approved or cleared by the [place] FDA and  has been authorized for detection and/or diagnosis of SARS-CoV-2 by FDA under an Emergency Use Authorization (EUA). This EUA will remain  in  effect (meaning this test can be used) for the duration of the COVID-19 declaration under Section 564(b)(1) of the Act, 21 U.S.C.section 360bbb-3(b)(1), unless the authorization is terminated  or revoked sooner.       Influenza A by PCR NEGATIVE NEGATIVE Final   Influenza B by PCR NEGATIVE NEGATIVE Final    Comment: (NOTE) The Xpert Xpress SARS-CoV-2/FLU/RSV plus assay is intended as an aid in the diagnosis of influenza from Nasopharyngeal swab specimens and should not be used as a sole basis for treatment. Nasal washings and aspirates are unacceptable for Xpert Xpress SARS-CoV-2/FLU/RSV testing.  Fact Sheet for Patients: EntrepreneurPulse.com.au  Fact Sheet for Healthcare Providers: IncredibleEmployment.be  This test is not yet approved or cleared by the [place] FDA and has been authorized for detection and/or diagnosis of SARS-CoV-2 by FDA under an Emergency Use Authorization (EUA). This EUA will remain in effect (meaning this test can be used) for the duration of the COVID-19 declaration under Section 564(b)(1) of the Act, 21 U.S.C. section 360bbb-3(b)(1), unless the authorization is terminated or revoked.  Performed at [facility], [address] [place], [place] [address]     RADIOLOGY:  No results found.   CODE STATUS:     Code Status Orders  (From admission, onward)         Start     Ordered   02/21/20 0209  Full code  Continuous        02/21/20 0211        Code Status History    Date Active Date Inactive Code Status Order ID Comments User Context   10/14/2016 1842 10/17/2016 1418 Full Code [id number]  Henreitta Leber, MD Inpatient   Advance Care Planning Activity       TOTAL TIME TAKING CARE OF THIS PATIENT: *35* minutes.    Fritzi Mandes M.D  [facility]    CC: Primary care physician; Towanda Malkin, MD

## 2020-02-26 NOTE — Progress Notes (Deleted)
Patient is a 59 year old female Her last visit at [facility] was 04/2018 I have not had a prior visit with this patient She follows up today after a recent hospital admission for a post hospital follow-up. Her initial assessment in the ER was as follows:  Patient presents emergency department for several days of feeling generally unwell along with nausea vomiting shortness of breath.  States she has not been able to eat for the past 2 to 3 days due to nausea and vomiting.  Patient's lab work found to be quite abnormal, appears to be in a degree of renal insufficiency, has an elevated white blood cell count has an elevated blood glucose.  Patient denies any history of diabetes.  Has not eaten or drinking anything with sugar prior to the lab work being performed.  Unable to calculate anion gap added on a VBG.  VBG confirms pH of 7.0 with a low PCO2 consistent respiratory compensation.  Highly suspect new onset diabetes with diabetic ketoacidosis.  Covid test is pending which could be contributing to this as well, patient is not vaccinated but denies any fever cough.  Given these findings including the VBG we will start the patient on insulin infusion.  I have ordered 2 L of normal saline for the patient to be bolused.  Patient does have a slightly elevated lipase and LFTs which could explain her upper abdominal discomfort.  Will obtain right upper quadrant ultrasound as precaution  Her discharge summary was as follows: DATE OF ADMISSION:  02/20/2020    ADMITTING PHYSICIAN: Darliss Cheney, MD  DATE OF DISCHARGE: 02/26/2020  PRIMARY CARE PHYSICIAN: Towanda Malkin, MD    ADMISSION DIAGNOSIS:  Aspiration pneumonia ([place]) [E10.0] Metabolic acidosis [F12.1] DKA (diabetic ketoacidosis) ([place]) [E11.10] Diabetes mellitus, new onset ([place]) [E11.9] Diabetic ketoacidosis without coma associated with other specified diabetes mellitus ([place]) [E13.10]  DISCHARGE DIAGNOSIS:    SECONDARY DIAGNOSIS:        Past Medical History:  Diagnosis Date  . Anxiety   . Aortic atherosclerosis ([place]) 10/24/2016  . GERD (gastroesophageal reflux disease)     HOSPITAL COURSE:   Shamela Haydon Johnsonis a 59 y.o.femalewith medical history significant forhypertension who presented to the ER with a 1 day complaint of nausea vomiting, shortness of breath and generalized weakness along with mild upper abdominal pain.  Starvation ketoacidosis: Initially presented with nausea vomiting weakness with elevated sugar in the 200s venous pH of 7, serum bicarb less than 7 and large anion gap. A1c 4.5 with no prior history of diabetes -Patient has a history of acetone ingestion. Acetone level is minimally elevated. --Ketoacidosis has resolved. -pt tolerating po diet  Dysphagia: Much better now that patient is more alert. Seen by SLP on regular basis. Now advanced to dysphagia 3 diet.  Acute metabolic encephalopathy, unclear etiology at this point:--resolved - She is now fully alert and oriented.   Alcohol use disorder with concern for alcohol withdrawal: - Patient has had visual hallucinations while in the ED. Placed on CIWA protocol for precaution. - No clear signs of alcohol withdrawal at this point in time.  AKI: Resolved.  Hypokalemia:Resolved.  Severe hypophosphatemia in the setting of starvation Resolved.  Hypomagnesemia Resolved.  Meningioma:MRI brain with contrast shows 8 mm small meningioma. Discussed with on-call neurosurgery Dr. Lacinda Axon who recommends outpatient follow-up.  GERD:Continue PPI  Code Status:Full code  Family Communication:None at bedside  Disposition Plan: please se TOC note for details Pt will d/c to home with friend. HHPT has been set up by  TOC  CONSULTS OBTAINED:   DRUG ALLERGIES:  No Known Allergies  DISCHARGE MEDICATIONS:   Allergies as of 02/26/2020   No Known Allergies        Medication List    TAKE these medications   folic  acid 1 MG tablet Commonly known as: FOLVITE Take 1 tablet (1 mg total) by mouth daily. Start taking on: February 27, 2020   guaiFENesin 600 MG 12 hr tablet Commonly known as: MUCINEX Take 2 tablets (1,200 mg total) by mouth 2 (two) times daily.   ibuprofen 200 MG tablet Commonly known as: ADVIL Take 200 mg by mouth every 6 (six) hours as needed for headache.   multivitamin with minerals Tabs tablet Take 1 tablet by mouth daily. Start taking on: February 27, 2020   omeprazole 20 MG capsule Commonly known as: PRILOSEC Take 20 mg by mouth daily as needed (reflux).   thiamine 100 MG tablet Take 0.5 tablets (50 mg total) by mouth daily. Start taking on: February 27, 2020     Since discharge, she has continued to feel better.

## 2020-02-26 NOTE — TOC Transition Note (Signed)
Transition of Care [facility]) - CM/SW Discharge Note   Patient Details  Name: KYLYN MCDADE MRN: [id number] Date of Birth: 1960-10-14  Transition of Care [facility]) CM/SW Contact:  Candie Chroman, LCSW Phone Number: 02/26/2020, 2:01 PM   Clinical Narrative:  Unable to set patient up with charity home health because [facility] administrators said that [facility] will pay for outpatient therapy. CSW called patient to notify. She does not have transportation to get to appointments right now but is agreeable to going home with a list to follow up once she is able. Patient asking about getting insurance. According to [employer], open enrollment ends January 15. She has a friend that can help her with process. Patient stated she will start getting her social security checks soon. No further concerns. MD planning on discharging her today. CSW signing off.   Final next level of care: OP Rehab Barriers to Discharge: Barriers Resolved   Patient Goals and CMS Choice Patient states their goals for this hospitalization and ongoing recovery are:: Go home, was living with e-boyfriend son, who died week ago from heart failure.   Choice offered to / list presented to : NA  Discharge Placement                Patient to be transferred to facility by: Friend will pick her up   Patient and family notified of of transfer: 02/26/20  Discharge Plan and Services In-house Referral: Clinical Social Work Discharge Planning Services: NA Post Acute Care Choice: NA          DME Arranged: N/A DME Agency: NA       HH Arranged: NA HH Agency: NA        Social Determinants of Health (SDOH) Interventions     Readmission Risk Interventions No flowsheet data found.

## 2020-02-26 NOTE — TOC Progression Note (Addendum)
Transition of Care [facility]) - Progression Note    Patient Details  Name: Yvonne Scott MRN: [id number] Date of Birth: 14-Oct-1960  Transition of Care [facility]) CM/SW Leslie, LCSW Phone Number: 02/26/2020, 12:13 PM  Clinical Narrative:  CSW met with patient to discuss home health. Patient said she has a Medicaid card at home that she received in May. CSW sent secure email to financial counselor to see if she would confirm that she has active Medicaid. This will determine if she can get charity home health or if we need to search other agencies to see if they can take Medicaid.   1:17 pm: Per Development worker, community, patient has family planning Medicaid which will not pay for any post-acute care. [facility] is the charity home health agency this week so their representative is going to check and see if they can take her.  Expected Discharge Plan: Home/Self Care Barriers to Discharge: Continued Medical Work up  Expected Discharge Plan and Services Expected Discharge Plan: Home/Self Care In-house Referral: Clinical Social Work Discharge Planning Services: NA Post Acute Care Choice: NA Living arrangements for the past 2 months: Single Family Home                 DME Arranged: N/A DME Agency: NA       HH Arranged: NA HH Agency: NA         Social Determinants of Health (SDOH) Interventions    Readmission Risk Interventions No flowsheet data found.

## 2020-02-26 NOTE — Progress Notes (Signed)
SLP Cancellation Note  Patient Details Name: Yvonne Scott MRN: [id number] DOB: 1960/09/21   Cancelled treatment:       Reason Eval/Treat Not Completed:  (chart reviewed; consulted NSG re: pt's status). Per chart notes and NSG report, pt has been tolerating the recommended Dysphagia level 3(mech soft foods) diet w/ thin liquids adequately w/ no reports of overt s/s of aspiration. Recommend continue w/ current diet and general aspiration precautions during any oral intake for safe swallowing. NSG to reconsult if any new acute needs arise during admit. NSG agreed.     Orinda Kenner, MS, CCC-SLP Speech Language Pathologist Rehab Services [phone] [facility] 02/26/2020, 3:36 PM

## 2020-02-26 NOTE — Progress Notes (Signed)
Catha Brow to be D/C'd home per MD order.  Discussed prescriptions and follow up appointments with the patient. Prescriptions given to patient, medication list explained in detail. Pt verbalized understanding.  Allergies as of 02/26/2020   No Known Allergies      Medication List     TAKE these medications    folic acid 1 MG tablet Commonly known as: FOLVITE Take 1 tablet (1 mg total) by mouth daily. Start taking on: February 27, 2020   guaiFENesin 600 MG 12 hr tablet Commonly known as: MUCINEX Take 2 tablets (1,200 mg total) by mouth 2 (two) times daily.   ibuprofen 200 MG tablet Commonly known as: ADVIL Take 200 mg by mouth every 6 (six) hours as needed for headache.   multivitamin with minerals Tabs tablet Take 1 tablet by mouth daily. Start taking on: February 27, 2020   omeprazole 20 MG capsule Commonly known as: PRILOSEC Take 20 mg by mouth daily as needed (reflux).   thiamine 100 MG tablet Take 0.5 tablets (50 mg total) by mouth daily. Start taking on: February 27, 2020        Vitals:   02/26/20 0414 02/26/20 1154  BP: 109/64 120/71  Pulse: 90 96  Resp: 17 16  Temp: 98.9 F (37.2 C) 98 F (36.7 C)  SpO2: 93% 96%    Skin clean, dry and intact without evidence of skin break down, no evidence of skin tears noted. IV catheter discontinued intact. Site without signs and symptoms of complications. Dressing and pressure applied. Pt denies pain at this time. No complaints noted.  An After Visit Summary was printed and given to the patient. Patient escorted via [place], and D/C home via private auto.  [place] A Brantley Naser

## 2020-02-28 ENCOUNTER — Inpatient Hospital Stay: Payer: Medicaid Other | Admitting: Internal Medicine

## 2020-03-14 HISTORY — PX: BREAST BIOPSY: SHX20

## 2020-03-18 ENCOUNTER — Encounter: Payer: Self-pay | Admitting: *Deleted

## 2020-08-25 ENCOUNTER — Telehealth: Payer: Self-pay | Admitting: Obstetrics & Gynecology

## 2020-08-25 ENCOUNTER — Telehealth: Payer: Medicaid Other | Admitting: Obstetrics & Gynecology

## 2020-08-25 NOTE — Telephone Encounter (Signed)
Contact the pt at a different number [phone] to set up the appt for Wed. 6/15 with Dr. Kenton Kingfisher.  The pt did not have a voice mailbox that was set up.    *Pt. Number in Epic is no longer in service.

## 2020-08-25 NOTE — Telephone Encounter (Signed)
Attempted to reach the patient for first available appt with Dr. Kenton Kingfisher on Wednesday, June 15 at 1:15.

## 2020-09-01 ENCOUNTER — Telehealth: Payer: Self-pay | Admitting: Obstetrics & Gynecology

## 2020-09-01 NOTE — Telephone Encounter (Signed)
Called pt to make first available appt.  The patient has a voice mailbox that is not set up.

## 2021-01-19 HISTORY — PX: PORTA CATH INSERTION: CATH118285

## 2021-02-19 ENCOUNTER — Other Ambulatory Visit: Payer: Self-pay

## 2021-02-19 ENCOUNTER — Inpatient Hospital Stay: Payer: Medicaid Other

## 2021-02-19 ENCOUNTER — Telehealth: Payer: Self-pay

## 2021-02-19 ENCOUNTER — Encounter: Payer: Self-pay | Admitting: *Deleted

## 2021-02-19 ENCOUNTER — Encounter: Payer: Self-pay | Admitting: Oncology

## 2021-02-19 ENCOUNTER — Inpatient Hospital Stay: Payer: Medicaid Other | Attending: Oncology | Admitting: Oncology

## 2021-02-19 VITALS — BP 135/93 | HR 105 | Temp 96.6°F | Resp 20 | Wt 110.4 lb

## 2021-02-19 DIAGNOSIS — Z79899 Other long term (current) drug therapy: Secondary | ICD-10-CM | POA: Diagnosis not present

## 2021-02-19 DIAGNOSIS — Z5111 Encounter for antineoplastic chemotherapy: Secondary | ICD-10-CM | POA: Diagnosis present

## 2021-02-19 DIAGNOSIS — C50911 Malignant neoplasm of unspecified site of right female breast: Secondary | ICD-10-CM

## 2021-02-19 DIAGNOSIS — Z5181 Encounter for therapeutic drug level monitoring: Secondary | ICD-10-CM

## 2021-02-19 DIAGNOSIS — N644 Mastodynia: Secondary | ICD-10-CM | POA: Insufficient documentation

## 2021-02-19 DIAGNOSIS — Z7189 Other specified counseling: Secondary | ICD-10-CM | POA: Diagnosis not present

## 2021-02-19 DIAGNOSIS — Z5112 Encounter for antineoplastic immunotherapy: Secondary | ICD-10-CM | POA: Diagnosis not present

## 2021-02-19 DIAGNOSIS — Z808 Family history of malignant neoplasm of other organs or systems: Secondary | ICD-10-CM | POA: Diagnosis not present

## 2021-02-19 DIAGNOSIS — R918 Other nonspecific abnormal finding of lung field: Secondary | ICD-10-CM | POA: Insufficient documentation

## 2021-02-19 DIAGNOSIS — Z803 Family history of malignant neoplasm of breast: Secondary | ICD-10-CM | POA: Insufficient documentation

## 2021-02-19 DIAGNOSIS — G893 Neoplasm related pain (acute) (chronic): Secondary | ICD-10-CM | POA: Diagnosis not present

## 2021-02-19 DIAGNOSIS — F17210 Nicotine dependence, cigarettes, uncomplicated: Secondary | ICD-10-CM | POA: Diagnosis not present

## 2021-02-19 DIAGNOSIS — Z171 Estrogen receptor negative status [ER-]: Secondary | ICD-10-CM | POA: Diagnosis not present

## 2021-02-19 LAB — CBC WITH DIFFERENTIAL/PLATELET
Abs Immature Granulocytes: 0.02 10*3/uL (ref 0.00–0.07)
Basophils Absolute: 0 10*3/uL (ref 0.0–0.1)
Basophils Relative: 1 %
Eosinophils Absolute: 0.1 10*3/uL (ref 0.0–0.5)
Eosinophils Relative: 2 %
HCT: 37.6 % (ref 36.0–46.0)
Hemoglobin: 12.7 g/dL (ref 12.0–15.0)
Immature Granulocytes: 0 %
Lymphocytes Relative: 32 %
Lymphs Abs: 2.3 10*3/uL (ref 0.7–4.0)
MCH: 31.1 pg (ref 26.0–34.0)
MCHC: 33.8 g/dL (ref 30.0–36.0)
MCV: 91.9 fL (ref 80.0–100.0)
Monocytes Absolute: 0.3 10*3/uL (ref 0.1–1.0)
Monocytes Relative: 4 %
Neutro Abs: 4.4 10*3/uL (ref 1.7–7.7)
Neutrophils Relative %: 61 %
Platelets: 267 10*3/uL (ref 150–400)
RBC: 4.09 MIL/uL (ref 3.87–5.11)
RDW: 13.5 % (ref 11.5–15.5)
WBC: 7.2 10*3/uL (ref 4.0–10.5)
nRBC: 0 % (ref 0.0–0.2)

## 2021-02-19 LAB — COMPREHENSIVE METABOLIC PANEL
ALT: 10 U/L (ref 0–44)
AST: 16 U/L (ref 15–41)
Albumin: 3.8 g/dL (ref 3.5–5.0)
Alkaline Phosphatase: 62 U/L (ref 38–126)
Anion gap: 11 (ref 5–15)
BUN: 11 mg/dL (ref 8–23)
CO2: 22 mmol/L (ref 22–32)
Calcium: 8.9 mg/dL (ref 8.9–10.3)
Chloride: 102 mmol/L (ref 98–111)
Creatinine, Ser: 0.66 mg/dL (ref 0.44–1.00)
GFR, Estimated: >60 mL/min (ref >60)
Glucose, Bld: 89 mg/dL (ref 70–99)
Potassium: 3.7 mmol/L (ref 3.5–5.1)
Sodium: 135 mmol/L (ref 135–145)
Total Bilirubin: 0.6 mg/dL (ref 0.3–1.2)
Total Protein: 7.2 g/dL (ref 6.5–8.1)

## 2021-02-19 NOTE — Progress Notes (Signed)
Patient states she is in pain every day. Pain level is usually a 10 right now is a 6 because she took some advil. Advil is helping only a little bit and would like to know if she could get  some pain medications. Patient states port is swollen.

## 2021-02-19 NOTE — Progress Notes (Signed)
Hematology/Oncology Consult note [phone] [phone]      Patient Care Team: [name], NP as PCP - General (Nurse Practitioner)  REFERRING PROVIDER: Ardell Isaacs, NP  CHIEF COMPLAINTS/REASON FOR VISIT:  Evaluation of breast cancer.   HISTORY OF PRESENTING ILLNESS:   Yvonne Scott is a  60 y.o.  female with PMH listed below was seen in consultation at the request of  Ardell Isaacs, NP  for evaluation of breast cancer.  August 2022, patient was diagnosed with right breast stage IIIb cT3 N0M0, grade 2, ER negative, PR negative HER2 negative breast cancer.  Patient self palpated breast mass many years ago.  10/21/2020 diagnostic mammogram and ultrasound confirmed presence of mass and a subsequent biopsy revealed right breast triple negative cancer. There was plan for patient to start with neoadjuvant chemotherapy followed by surgery and radiation.  Unfortunately patient never started treatment.  Patient was accompanied by her mother.  They want to transfer her oncology care to locally.  Patient reports having right breast pain as well as generalized pain.  Patient had a [name] placed 3 weeks ago and she reports some swelling around the sites.  Patient has had negative genetic testing at [facility].  Patient has a history of alcohol dependence.  Per patient she has not been drinking recently.  Everyday smoker.   Review of Systems  Constitutional:  Negative for appetite change, chills, fatigue and fever.  HENT:   Negative for hearing loss and voice change.   Eyes:  Negative for eye problems.  Respiratory:  Negative for chest tightness and cough.   Cardiovascular:  Negative for chest pain.  Gastrointestinal:  Negative for abdominal distention, abdominal pain and blood in stool.  Endocrine: Negative for hot flashes.  Genitourinary:  Negative for difficulty urinating and frequency.   Musculoskeletal:  Negative for arthralgias.       Generalized body  aches  Skin:  Negative for itching and rash.  Neurological:  Negative for extremity weakness.  Hematological:  Negative for adenopathy.  Psychiatric/Behavioral:  Negative for confusion.   Right breast mass with nipple discharge.  Tender. MEDICAL HISTORY:  Past Medical History:  Diagnosis Date   Anxiety    Aortic atherosclerosis ([place]) 10/24/2016   Breast cancer ([place])    GERD (gastroesophageal reflux disease)     SURGICAL HISTORY: History reviewed. No pertinent surgical history.  SOCIAL HISTORY: Social History   Socioeconomic History   Marital status: Widowed    Spouse name: Not on file   Number of children: Not on file   Years of education: Not on file   Highest education level: Not on file  Occupational History   Not on file  Tobacco Use   Smoking status: Every Day    Packs/day: 1.00    Years: 36.00    Pack years: 36.00    Types: Cigarettes    Start date: 03/15/1979   Smokeless tobacco: Never  Vaping Use   Vaping Use: Never used  Substance and Sexual Activity   Alcohol use: No    Alcohol/week: 3.0 standard drinks    Types: 3 Standard drinks or equivalent per week   Drug use: Not Currently   Sexual activity: Not Currently    Birth control/protection: None  Other Topics Concern   Not on file  Social History Narrative   Not on file   Social Determinants of Health   Financial Resource Strain: Not on file  Food Insecurity: Not on file  Transportation Needs: Not  on file  Physical Activity: Not on file  Stress: Not on file  Social Connections: Not on file  Intimate Partner Violence: Not on file    FAMILY HISTORY: Family History  Problem Relation Age of Onset   Cancer Mother 38       breast   Cancer Father        melonma   Cirrhosis Maternal Grandfather    Cancer Paternal Grandmother        skin/breast    ALLERGIES:  has No Known Allergies.  MEDICATIONS:  Current Outpatient Medications  Medication Sig Dispense Refill   guaiFENesin (MUCINEX) 600 MG  12 hr tablet Take 2 tablets (1,200 mg total) by mouth 2 (two) times daily. 60 tablet 0   ibuprofen (ADVIL) 200 MG tablet Take 200 mg by mouth every 6 (six) hours as needed for headache.     ondansetron (ZOFRAN) 8 MG tablet Take by mouth.     prochlorperazine (COMPAZINE) 10 MG tablet Take by mouth.     folic acid (FOLVITE) 1 MG tablet Take 1 tablet (1 mg total) by mouth daily. (Patient not taking: Reported on 02/19/2021) 30 tablet 0   Multiple Vitamin (MULTIVITAMIN WITH MINERALS) TABS tablet Take 1 tablet by mouth daily. (Patient not taking: Reported on 02/19/2021) 30 tablet 0   omeprazole (PRILOSEC) 20 MG capsule Take 20 mg by mouth daily as needed (reflux). (Patient not taking: Reported on 02/19/2021)     thiamine 100 MG tablet Take 0.5 tablets (50 mg total) by mouth daily. (Patient not taking: Reported on 02/19/2021) 15 tablet 0   No current facility-administered medications for this visit.     PHYSICAL EXAMINATION: ECOG PERFORMANCE STATUS: 1 - Symptomatic but completely ambulatory Vitals:   02/19/21 0939  BP: (!) 135/93  Pulse: (!) 105  Resp: 20  Temp: (!) 96.6 F (35.9 C)   Filed Weights   02/19/21 0939  Weight: 110 lb 6.4 oz (50.1 kg)    Physical Exam Constitutional:      General: She is not in acute distress. HENT:     Head: Normocephalic and atraumatic.  Eyes:     General: No scleral icterus. Cardiovascular:     Rate and Rhythm: Normal rate and regular rhythm.     Heart sounds: Normal heart sounds.  Pulmonary:     Effort: Pulmonary effort is normal. No respiratory distress.     Breath sounds: No wheezing.  Abdominal:     General: Bowel sounds are normal. There is no distension.     Palpations: Abdomen is soft.  Musculoskeletal:        General: No deformity. Normal range of motion.     Cervical back: Normal range of motion and neck supple.  Skin:    General: Skin is warm and dry.     Findings: No erythema or rash.  Neurological:     Mental Status: She is alert and  oriented to person, place, and time. Mental status is at baseline.     Cranial Nerves: No cranial nerve deficit.     Coordination: Coordination normal.  Psychiatric:     Comments: anxious  Breast exam was performed in seated and lying down position. Right breast fixed mass,  tender.  Right nipple discharge.  No palpable breast mass on the left side. I do not palpate axillary lymphadenopathy bilaterally.  LABORATORY DATA:  I have reviewed the data as listed Lab Results  Component Value Date   WBC 7.2 02/19/2021   HGB 12.7 02/19/2021  HCT 37.6 02/19/2021   MCV 91.9 02/19/2021   PLT 267 02/19/2021   Recent Labs    02/24/20 0534 02/25/20 0505 02/19/21 1100  NA 141 135 135  K 4.2 3.7 3.7  CL 109 103 102  CO2 22 23 22   GLUCOSE 99 97 89  BUN 5* <5* 11  CREATININE 0.51 0.44 0.66  CALCIUM 7.9* 8.5* 8.9  GFRNONAA >60 >60 >60  PROT 5.9* 6.2* 7.2  ALBUMIN 3.1* 3.2* 3.8  AST 52* 77* 16  ALT 44 51* 10  ALKPHOS 62 67 62  BILITOT 0.7 0.5 0.6   Iron/TIBC/Ferritin/ %Sat    Component Value Date/Time   IRON 80 10/24/2016 1534   TIBC 282 10/24/2016 1534   FERRITIN 282 (H) 11/17/2016 1404   IRONPCTSAT 28 10/24/2016 1534      RADIOGRAPHIC STUDIES: I have personally reviewed the radiological images as listed and agreed with the findings in the report. No results found.    ASSESSMENT & PLAN:  1. Invasive ductal carcinoma of right breast ([place])   2. Encounter for monitoring cardiotoxic drug therapy   3. Goals of care, counseling/discussion    Cancer Staging  Invasive ductal carcinoma of right breast [facility]) Staging form: Breast, AJCC 8th Edition - Clinical stage from 02/19/2021: Stage IIIB (cT3, cN0, cM0, G2, ER-, PR-, HER2-) - Signed by Earlie Server, MD on 02/20/2021  #Clinical stage III triple negative right breast cancer. Previous imaging reports and pathology reports were reviewed and discussed with patient and her mother. We discussed about the diagnosis of triple negative  breast cancer, aggressive disease nature and management plan. I will obtain CT chest abdomen pelvis to complete staging.  Obtain images and the pathology slides from [facility] I recommended neoadjuvant chemotherapy including pembrolizumab/weekly Taxol/every 3-week carboplatin for 12 weeks followed by doxorubicin and cyclophosphamide.  I discussed about rationale of chemotherapy and the potential side effects.  She agrees with the plan. Check 2D echo  #[name] has been placed at [facility]. I will refer patient to establish care with surgery for the initial evaluation. Chemotherapy class.  Breast cancer nurse navigator to meet patient today as well. Refer to palliative care service for pain management.  Check CBC, CMP, CA 15.3, CA 27.29. Orders Placed This Encounter  Procedures   NM PET Image Initial (PI) Skull Base To Thigh    Standing Status:   Future    Standing Expiration Date:   02/19/2022    Order Specific Question:   If indicated for the ordered procedure, I authorize the administration of a radiopharmaceutical per Radiology protocol    Answer:   Yes    Order Specific Question:   Preferred imaging location?    Answer:   [facility]   CBC with Differential/Platelet    Standing Status:   Future    Number of Occurrences:   1    Standing Expiration Date:   02/19/2022   Comprehensive metabolic panel    Standing Status:   Future    Number of Occurrences:   1    Standing Expiration Date:   02/19/2022   Cancer antigen 15-3    Standing Status:   Future    Standing Expiration Date:   02/19/2022   Cancer antigen 27.29    Standing Status:   Future    Standing Expiration Date:   02/19/2022   Ambulatory referral to General Surgery    Referral Priority:   Routine    Referral Type:   Surgical    Referral Reason:  Specialty Services Required    Referred to Provider:   Herbert Pun, MD    Requested Specialty:   General Surgery    Number of Visits Requested:   1   Ambulatory Referral to  Palliative Care    Referral Priority:   Routine    Referral Type:   Consultation    Referral Reason:   Symptom Managment    Number of Visits Requested:   1   ECHOCARDIOGRAM COMPLETE    Standing Status:   Future    Standing Expiration Date:   02/19/2022    Order Specific Question:   Where should this test be performed    Answer:   [facility]    Order Specific Question:   Perflutren DEFINITY (image enhancing agent) should be administered unless hypersensitivity or allergy exist    Answer:   Administer Perflutren    Order Specific Question:   Reason for exam-Echo    Answer:   Chemo  Z09    All questions were answered. The patient knows to call the clinic with any problems questions or concerns.  cc Ardell Isaacs, NP    Return of visit: to be determined Thank you for this kind referral and the opportunity to participate in the care of this patient. A copy of today's note is routed to referring provider   Earlie Server, MD, PhD  02/19/2021

## 2021-02-19 NOTE — Progress Notes (Signed)
Met patient and her mom today after her initial consultation with Dr. Tasia Catchings.  Patient transferred her care to [place] from [facility].  Gave patient breast cancer educational literature, "My Breast Cancer Treatment Handbook" by Josephine Igo, RN.   She was encouraged to call with any questions or needs.  Patient states she is going to have a PET scan prior to her treatment.

## 2021-02-19 NOTE — Telephone Encounter (Signed)
Peer to Peer was done for echo and PET. Echo was approved but PET was denied.   Per Dr. Tasia Catchings: PET denied. they will approve CT c/a/p and echo, priautho No is [id number]. please get CT done ASAP, if no availability, get STAT image  Please cancel PET and schedule Ct. Please update pt with appts.

## 2021-02-20 DIAGNOSIS — Z7189 Other specified counseling: Secondary | ICD-10-CM | POA: Insufficient documentation

## 2021-02-20 DIAGNOSIS — C50911 Malignant neoplasm of unspecified site of right female breast: Secondary | ICD-10-CM | POA: Insufficient documentation

## 2021-02-22 ENCOUNTER — Inpatient Hospital Stay: Payer: Medicaid Other

## 2021-02-22 ENCOUNTER — Inpatient Hospital Stay (HOSPITAL_BASED_OUTPATIENT_CLINIC_OR_DEPARTMENT_OTHER): Payer: Medicaid Other | Admitting: Hospice and Palliative Medicine

## 2021-02-22 ENCOUNTER — Other Ambulatory Visit: Payer: Self-pay

## 2021-02-22 ENCOUNTER — Encounter: Payer: Self-pay | Admitting: Hospice and Palliative Medicine

## 2021-02-22 ENCOUNTER — Telehealth: Payer: Self-pay | Admitting: *Deleted

## 2021-02-22 VITALS — BP 123/79 | HR 96 | Temp 99.0°F | Resp 18 | Wt 110.1 lb

## 2021-02-22 DIAGNOSIS — Z5112 Encounter for antineoplastic immunotherapy: Secondary | ICD-10-CM | POA: Diagnosis not present

## 2021-02-22 DIAGNOSIS — G893 Neoplasm related pain (acute) (chronic): Secondary | ICD-10-CM

## 2021-02-22 DIAGNOSIS — Z515 Encounter for palliative care: Secondary | ICD-10-CM

## 2021-02-22 DIAGNOSIS — C50911 Malignant neoplasm of unspecified site of right female breast: Secondary | ICD-10-CM

## 2021-02-22 MED ORDER — GABAPENTIN 100 MG PO CAPS
100.0000 mg | ORAL_CAPSULE | Freq: Three times a day (TID) | ORAL | 0 refills | Status: DC
Start: 2021-02-22 — End: 2021-05-04

## 2021-02-22 NOTE — Progress Notes (Signed)
[place] [facility] Telephone:([phone] [phone]   Name: Yvonne Scott Date: 02/22/2021 MRN: [id number]  DOB: 08-04-1960  Patient Care Team: [name], NP as PCP - General (Nurse Practitioner)    REASON FOR CONSULTATION: Yvonne Scott is a 60 y.o. female with multiple medical problems including alcohol dependence, tobacco use, and stage IIIb triple negative right breast cancer.  Plan is for neoadjuvant chemotherapy/immunotherapy.  Patient has had breast pain and was referred to palliative care for symptom management.  SOCIAL HISTORY:     reports that she has been smoking cigarettes. She started smoking about 41 years ago. She has a 36.00 pack-year smoking history. She has never used smokeless tobacco. She reports that she does not currently use drugs. She reports that she does not drink alcohol.  Patient is widowed.  She lives at home with her father.  She has a daughter who lives nearby.  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  ADVANCE DIRECTIVES:  None on file  CODE STATUS:   PAST MEDICAL HISTORY: Past Medical History:  Diagnosis Date   Anxiety    Aortic atherosclerosis ([place]) 10/24/2016   Breast cancer ([place])    GERD (gastroesophageal reflux disease)     PAST SURGICAL HISTORY: History reviewed. No pertinent surgical history.  HEMATOLOGY/ONCOLOGY HISTORY:  Oncology History  Invasive ductal carcinoma of right breast ([place])  02/19/2021 Cancer Staging   Staging form: Breast, AJCC 8th Edition - Clinical stage from 02/19/2021: Stage IIIB (cT3, cN0, cM0, G2, ER-, PR-, HER2-) - Signed by Earlie Server, MD on 02/20/2021 Stage prefix: Initial diagnosis Histologic grading system: 3 grade system    02/20/2021 Initial Diagnosis   Invasive ductal carcinoma of right breast ([facility])     ALLERGIES:  has No Known Allergies.  MEDICATIONS:  Current Outpatient  Medications  Medication Sig Dispense Refill   guaiFENesin (MUCINEX) 600 MG 12 hr tablet Take 2 tablets (1,200 mg total) by mouth 2 (two) times daily. 60 tablet 0   ibuprofen (ADVIL) 200 MG tablet Take 200 mg by mouth every 6 (six) hours as needed for headache.     Multiple Vitamin (MULTIVITAMIN WITH MINERALS) TABS tablet Take 1 tablet by mouth daily. 30 tablet 0   folic acid (FOLVITE) 1 MG tablet Take 1 tablet (1 mg total) by mouth daily. (Patient not taking: Reported on 02/19/2021) 30 tablet 0   omeprazole (PRILOSEC) 20 MG capsule Take 20 mg by mouth daily as needed (reflux). (Patient not taking: Reported on 02/19/2021)     ondansetron (ZOFRAN) 8 MG tablet Take by mouth. (Patient not taking: Reported on 02/22/2021)     prochlorperazine (COMPAZINE) 10 MG tablet Take by mouth. (Patient not taking: Reported on 02/22/2021)     thiamine 100 MG tablet Take 0.5 tablets (50 mg total) by mouth daily. (Patient not taking: Reported on 02/19/2021) 15 tablet 0   No current facility-administered medications for this visit.    VITAL SIGNS: BP 123/79   Pulse 96   Temp 99 F (37.2 C) (Tympanic)   Resp 18   Wt 110 lb 1.6 oz (49.9 kg)   LMP 11/19/2002 (Approximate)   SpO2 100%   BMI 18.90 kg/m  Filed Weights   02/22/21 1022  Weight: 110 lb 1.6 oz (49.9 kg)    Estimated body mass index is 18.9 kg/m as calculated from the following:   Height as of 02/22/20: 5'  4" (1.626 m).   Weight as of this encounter: 110 lb 1.6 oz (49.9 kg).  LABS: CBC:    Component Value Date/Time   WBC 7.2 02/19/2021 1100   HGB 12.7 02/19/2021 1100   HGB 12.7 02/17/2014 0323   HCT 37.6 02/19/2021 1100   HCT 37.7 02/17/2014 0323   PLT 267 02/19/2021 1100   PLT 156 02/17/2014 0323   MCV 91.9 02/19/2021 1100   MCV 100 02/17/2014 0323   NEUTROABS 4.4 02/19/2021 1100   NEUTROABS 5.9 02/17/2014 0323   LYMPHSABS 2.3 02/19/2021 1100   LYMPHSABS 3.2 02/17/2014 0323   MONOABS 0.3 02/19/2021 1100   MONOABS 0.4 02/17/2014 0323    EOSABS 0.1 02/19/2021 1100   EOSABS 0.0 02/17/2014 0323   BASOSABS 0.0 02/19/2021 1100   BASOSABS 0.1 02/17/2014 0323   Comprehensive Metabolic Panel:    Component Value Date/Time   NA 135 02/19/2021 1100   NA 134 (L) 02/17/2014 0323   K 3.7 02/19/2021 1100   K 3.3 (L) 02/17/2014 1113   CL 102 02/19/2021 1100   CL 100 02/17/2014 0323   CO2 22 02/19/2021 1100   CO2 26 02/17/2014 0323   BUN 11 02/19/2021 1100   BUN 6 (L) 02/17/2014 0323   CREATININE 0.66 02/19/2021 1100   CREATININE 0.77 01/27/2017 1418   GLUCOSE 89 02/19/2021 1100   GLUCOSE 108 (H) 02/17/2014 0323   CALCIUM 8.9 02/19/2021 1100   CALCIUM 7.9 (L) 02/17/2014 0323   AST 16 02/19/2021 1100   AST 41 (H) 02/17/2014 0323   ALT 10 02/19/2021 1100   ALT 36 02/17/2014 0323   ALKPHOS 62 02/19/2021 1100   ALKPHOS 47 02/17/2014 0323   BILITOT 0.6 02/19/2021 1100   BILITOT 0.8 02/17/2014 0323   PROT 7.2 02/19/2021 1100   PROT 5.9 (L) 02/17/2014 0323   ALBUMIN 3.8 02/19/2021 1100   ALBUMIN 3.0 (L) 02/17/2014 0323    RADIOGRAPHIC STUDIES: No results found.  PERFORMANCE STATUS (ECOG) : 1 - Symptomatic but completely ambulatory  Review of Systems Unless otherwise noted, a complete review of systems is negative.  Physical Exam General: NAD Pulmonary: Unlabored Extremities: no edema, no joint deformities Skin: no rashes Neurological: Weakness but otherwise nonfocal  IMPRESSION: I met with patient to discuss pain management.  Patient describes persistent breast pain since she had her biopsy.  Breast was examined by Dr. Tasia Catchings last week.  Patient describes the pain as feeling like an electric pain, which radiates up into her axilla.  She says it felt like it did when she had shingles but she denies having any rashes.  Patient denies any active alcohol use.  She is currently taking Motrin, which she says helps some but does not completely alleviate the pain.  The pain is keeping patient awake some nights.  Patient  denies other symptomatic complaints.  She is anxious to start treatment.  Patient's characterization of pain sounds neuropathic.  We will trial gabapentin.  PLAN: -Continue current scope of treatment -Start gabapentin 100 mg nightly and titrate to 3 times daily as tolerated -Patient will benefit from future conversation regarding advance care planning -Follow-up telephone visit 2 to 3 weeks   Patient expressed understanding and was in agreement with this plan. She also understands that She can call the clinic at any time with any questions, concerns, or complaints.     Time Total: 15 minutes  Visit consisted of counseling and education dealing with the complex and emotionally intense issues of symptom management and  palliative care in the setting of serious and potentially life-threatening illness.Greater than 50%  of this time was spent counseling and coordinating care related to the above assessment and plan.  Signed by: Altha Harm, PhD, NP-C

## 2021-02-22 NOTE — Progress Notes (Signed)
Visit today to discuss options for pain pt is having pain under R breast from recent biopsy 1 month ago. Taking advil for it. Rates the pain a 10/10. Breast area is red and swollen.

## 2021-02-22 NOTE — Telephone Encounter (Signed)
Radiology scheduling called stating that patient is scheduled for CTCAP tomorrow, but there is no order for it. He requests this order be entered in Epic or faxed to them [phone])  by 3 PM today or her appointment will be cancelled. I see an order in her chart, but it is not cosigned by md

## 2021-02-23 ENCOUNTER — Ambulatory Visit
Admission: RE | Admit: 2021-02-23 | Discharge: 2021-02-23 | Disposition: A | Discharge status: Home / Self Care | Payer: Medicaid Other | Source: Ambulatory Visit | Attending: Oncology | Admitting: Oncology

## 2021-02-23 ENCOUNTER — Inpatient Hospital Stay: Payer: Medicaid Other

## 2021-02-23 DIAGNOSIS — C50911 Malignant neoplasm of unspecified site of right female breast: Secondary | ICD-10-CM

## 2021-02-23 LAB — CANCER ANTIGEN 15-3: CA 15-3: 26.5 U/mL — ABNORMAL HIGH (ref 0.0–25.0)

## 2021-02-23 LAB — CANCER ANTIGEN 27.29: CA 27.29: 38.8 U/mL — ABNORMAL HIGH (ref 0.0–38.6)

## 2021-02-24 ENCOUNTER — Ambulatory Visit
Admission: RE | Admit: 2021-02-24 | Discharge: 2021-02-24 | Disposition: A | Discharge status: Home / Self Care | Payer: Medicaid Other | Source: Ambulatory Visit | Attending: Oncology | Admitting: Oncology

## 2021-02-24 ENCOUNTER — Other Ambulatory Visit: Payer: Self-pay

## 2021-02-24 DIAGNOSIS — C50911 Malignant neoplasm of unspecified site of right female breast: Secondary | ICD-10-CM | POA: Insufficient documentation

## 2021-02-24 MED ORDER — HEPARIN SOD (PORK) LOCK FLUSH 100 UNIT/ML IV SOLN
INTRAVENOUS | Status: AC
  Filled 2021-02-24: qty 5

## 2021-02-24 MED ORDER — IOHEXOL 300 MG/ML  SOLN
80.0000 mL | Freq: Once | INTRAMUSCULAR | Status: AC | PRN
  Administered 2021-02-24: 80 mL via INTRAVENOUS

## 2021-02-25 ENCOUNTER — Ambulatory Visit
Admission: RE | Admit: 2021-02-25 | Discharge: 2021-02-25 | Disposition: A | Discharge status: Home / Self Care | Payer: Medicaid Other | Source: Ambulatory Visit | Attending: Oncology | Admitting: Oncology

## 2021-02-25 DIAGNOSIS — F419 Anxiety disorder, unspecified: Secondary | ICD-10-CM | POA: Diagnosis not present

## 2021-02-25 DIAGNOSIS — Z79899 Other long term (current) drug therapy: Secondary | ICD-10-CM

## 2021-02-25 DIAGNOSIS — C50911 Malignant neoplasm of unspecified site of right female breast: Secondary | ICD-10-CM | POA: Insufficient documentation

## 2021-02-25 DIAGNOSIS — Z5181 Encounter for therapeutic drug level monitoring: Secondary | ICD-10-CM

## 2021-02-25 DIAGNOSIS — Z01818 Encounter for other preprocedural examination: Secondary | ICD-10-CM | POA: Insufficient documentation

## 2021-02-25 DIAGNOSIS — Z171 Estrogen receptor negative status [ER-]: Secondary | ICD-10-CM | POA: Diagnosis not present

## 2021-02-25 DIAGNOSIS — K219 Gastro-esophageal reflux disease without esophagitis: Secondary | ICD-10-CM | POA: Diagnosis not present

## 2021-02-25 LAB — ECHOCARDIOGRAM COMPLETE
AR max vel: 3 cm2
AV Area VTI: 2.72 cm2
AV Area mean vel: 2.61 cm2
AV Mean grad: 3 mmHg
AV Peak grad: 5.1 mmHg
Ao pk vel: 1.13 m/s
Area-P 1/2: 3.87 cm2
MV VTI: 3.81 cm2
S' Lateral: 2.5 cm

## 2021-02-25 NOTE — Progress Notes (Signed)
*  PRELIMINARY RESULTS* Echocardiogram 2D Echocardiogram has been performed.  Sherrie Sport 02/25/2021, 10:55 AM

## 2021-02-26 ENCOUNTER — Telehealth: Payer: Self-pay | Admitting: Oncology

## 2021-02-26 ENCOUNTER — Inpatient Hospital Stay: Payer: Medicaid Other

## 2021-02-26 NOTE — Telephone Encounter (Signed)
Attempted to contact pt to reschedule chemo education class from 02/26/21. Her mother answered and stated she would have her call us back to schedule.

## 2021-02-27 ENCOUNTER — Encounter: Payer: Self-pay | Admitting: Oncology

## 2021-02-27 DIAGNOSIS — Z5181 Encounter for therapeutic drug level monitoring: Secondary | ICD-10-CM | POA: Insufficient documentation

## 2021-02-27 MED ORDER — DEXAMETHASONE 4 MG PO TABS: ORAL_TABLET | ORAL | 1 refills | Status: DC

## 2021-02-27 MED ORDER — LIDOCAINE-PRILOCAINE 2.5-2.5 % EX CREA: TOPICAL_CREAM | CUTANEOUS | 3 refills | Status: DC

## 2021-02-27 MED ORDER — PROCHLORPERAZINE MALEATE 10 MG PO TABS: 10.0000 mg | ORAL_TABLET | Freq: Four times a day (QID) | ORAL | 1 refills | Status: DC | PRN

## 2021-02-27 MED ORDER — ONDANSETRON HCL 8 MG PO TABS: 8.0000 mg | ORAL_TABLET | Freq: Two times a day (BID) | ORAL | 1 refills | Status: DC | PRN

## 2021-02-27 NOTE — Addendum Note (Signed)
Addended by: Earlie Server on: 02/27/2021 11:25 PM   Modules accepted: Orders

## 2021-02-27 NOTE — Progress Notes (Signed)
START ON PATHWAY REGIMEN - Breast     Cycles 1 through 4: A cycle is every 21 days:     Pembrolizumab      Paclitaxel      Carboplatin      Filgrastim-xxxx    Cycles 5 through 8: A cycle is every 21 days:     Pembrolizumab      Doxorubicin      Cyclophosphamide      Pegfilgrastim-xxxx   **Always confirm dose/schedule in your pharmacy ordering system**  Patient Characteristics: Preoperative or Nonsurgical Candidate (Clinical Staging), Neoadjuvant Therapy followed by Surgery, Invasive Disease, Chemotherapy, HER2 Negative/Unknown/Equivocal, ER Negative/Unknown, Platinum Therapy Indicated and Candidate for Checkpoint Inhibitor Therapeutic Status: Preoperative or Nonsurgical Candidate (Clinical Staging) AJCC M Category: cM0 AJCC Grade: G2 Breast Surgical Plan: Neoadjuvant Therapy followed by Surgery ER Status: Negative (-) AJCC 8 Stage Grouping: IIIB HER2 Status: Negative (-) AJCC T Category: cT3 AJCC N Category: cN0 PR Status: Negative (-) Intent of Therapy: Curative Intent, Discussed with Patient

## 2021-03-01 ENCOUNTER — Telehealth: Payer: Self-pay

## 2021-03-01 NOTE — Telephone Encounter (Signed)
-----   Message from Earlie Server, MD sent at 02/27/2021 11:30 PM EST ----- Please arrange her to get chemotherapy class ASAP Keytruda with weekly paclitaxel and every 3 week carboplatin for 12 weeks followed by doxorubicin and cyclophosphamide (AC).  Plan lab md keytruda paclitaxel and carboplatin week of 12/27 Let patient know that antiemetic medication has been sent to her pharmacy. Please ask her to bring to chemo class and discuss instruction. Also bring to her visit with me.

## 2021-03-01 NOTE — Telephone Encounter (Signed)
Pt informed of appts and premeds.

## 2021-03-01 NOTE — Telephone Encounter (Deleted)
Please schedule Lab/MD/ Keytruda/ paclitaxel/ carboplatin *new* the week of 12/2. I will call her to let her know of appt.

## 2021-03-01 NOTE — Telephone Encounter (Signed)
Please schedule Lab/MD/ Keytruda/ paclitaxel/ carboplatin *new* the week of 12/27. I will call her to let her know of appt.

## 2021-03-01 NOTE — Telephone Encounter (Signed)
Pt scheduled for 12/29 at 8:30am.

## 2021-03-03 NOTE — Progress Notes (Signed)
Pharmacist Chemotherapy Monitoring - Initial Assessment    Anticipated start date: 03/11/21   The following has been reviewed per standard work regarding the patient's treatment regimen: The patient's diagnosis, treatment plan and drug doses, and organ/hematologic function Lab orders and baseline tests specific to treatment regimen  The treatment plan start date, drug sequencing, and pre-medications Prior authorization status  Patient's documented medication list, including drug-drug interaction screen and prescriptions for anti-emetics and supportive care specific to the treatment regimen The drug concentrations, fluid compatibility, administration routes, and timing of the medications to be used The patient's access for treatment and lifetime cumulative dose history, if applicable  The patient's medication allergies and previous infusion related reactions, if applicable   Changes made to treatment plan:  treatment plan date  Follow up needed:  dose adjustment of carbo due to no dose entered and signing treatment plan   Adelina Mings, [place], 03/03/2021  12:02 PM

## 2021-03-04 ENCOUNTER — Other Ambulatory Visit: Payer: Self-pay

## 2021-03-04 ENCOUNTER — Inpatient Hospital Stay: Payer: Medicaid Other

## 2021-03-11 ENCOUNTER — Inpatient Hospital Stay: Payer: Medicaid Other

## 2021-03-11 ENCOUNTER — Inpatient Hospital Stay: Payer: Medicaid Other | Admitting: Oncology

## 2021-03-11 ENCOUNTER — Telehealth: Payer: Self-pay | Admitting: Oncology

## 2021-03-11 ENCOUNTER — Other Ambulatory Visit: Payer: Self-pay

## 2021-03-11 ENCOUNTER — Encounter: Payer: Self-pay | Admitting: Oncology

## 2021-03-11 VITALS — BP 122/76 | HR 71 | Temp 96.5°F | Resp 18

## 2021-03-11 VITALS — BP 114/74 | HR 94 | Temp 98.1°F | Resp 18 | Ht 64.0 in | Wt 105.7 lb

## 2021-03-11 DIAGNOSIS — Z95828 Presence of other vascular implants and grafts: Secondary | ICD-10-CM

## 2021-03-11 DIAGNOSIS — Z5111 Encounter for antineoplastic chemotherapy: Secondary | ICD-10-CM

## 2021-03-11 DIAGNOSIS — G893 Neoplasm related pain (acute) (chronic): Secondary | ICD-10-CM | POA: Insufficient documentation

## 2021-03-11 DIAGNOSIS — C50911 Malignant neoplasm of unspecified site of right female breast: Secondary | ICD-10-CM

## 2021-03-11 DIAGNOSIS — R52 Pain, unspecified: Secondary | ICD-10-CM

## 2021-03-11 DIAGNOSIS — Z5112 Encounter for antineoplastic immunotherapy: Secondary | ICD-10-CM | POA: Diagnosis not present

## 2021-03-11 DIAGNOSIS — Z7189 Other specified counseling: Secondary | ICD-10-CM | POA: Diagnosis not present

## 2021-03-11 LAB — CBC WITH DIFFERENTIAL/PLATELET
Abs Immature Granulocytes: 0.02 10*3/uL (ref 0.00–0.07)
Basophils Absolute: 0.1 10*3/uL (ref 0.0–0.1)
Basophils Relative: 1 %
Eosinophils Absolute: 0.1 10*3/uL (ref 0.0–0.5)
Eosinophils Relative: 1 %
HCT: 39.4 % (ref 36.0–46.0)
Hemoglobin: 13.3 g/dL (ref 12.0–15.0)
Immature Granulocytes: 0 %
Lymphocytes Relative: 38 %
Lymphs Abs: 3.2 10*3/uL (ref 0.7–4.0)
MCH: 30.4 pg (ref 26.0–34.0)
MCHC: 33.8 g/dL (ref 30.0–36.0)
MCV: 90 fL (ref 80.0–100.0)
Monocytes Absolute: 0.5 10*3/uL (ref 0.1–1.0)
Monocytes Relative: 6 %
Neutro Abs: 4.5 10*3/uL (ref 1.7–7.7)
Neutrophils Relative %: 54 %
Platelets: 309 10*3/uL (ref 150–400)
RBC: 4.38 MIL/uL (ref 3.87–5.11)
RDW: 13.4 % (ref 11.5–15.5)
WBC: 8.4 10*3/uL (ref 4.0–10.5)
nRBC: 0 % (ref 0.0–0.2)

## 2021-03-11 LAB — COMPREHENSIVE METABOLIC PANEL
ALT: 9 U/L (ref 0–44)
AST: 16 U/L (ref 15–41)
Albumin: 3.8 g/dL (ref 3.5–5.0)
Alkaline Phosphatase: 63 U/L (ref 38–126)
Anion gap: 11 (ref 5–15)
BUN: 11 mg/dL (ref 8–23)
CO2: 21 mmol/L — ABNORMAL LOW (ref 22–32)
Calcium: 8.9 mg/dL (ref 8.9–10.3)
Chloride: 104 mmol/L (ref 98–111)
Creatinine, Ser: 0.54 mg/dL (ref 0.44–1.00)
GFR, Estimated: >60 mL/min (ref >60)
Glucose, Bld: 98 mg/dL (ref 70–99)
Potassium: 3.5 mmol/L (ref 3.5–5.1)
Sodium: 136 mmol/L (ref 135–145)
Total Bilirubin: 0.4 mg/dL (ref 0.3–1.2)
Total Protein: 6.9 g/dL (ref 6.5–8.1)

## 2021-03-11 LAB — TSH: TSH: 2.514 u[IU]/mL (ref 0.350–4.500)

## 2021-03-11 MED ORDER — SODIUM CHLORIDE 0.9 % IV SOLN
80.0000 mg/m2 | Freq: Once | INTRAVENOUS | Status: AC
  Administered 2021-03-11: 120 mg via INTRAVENOUS
  Filled 2021-03-11: qty 20

## 2021-03-11 MED ORDER — HEPARIN SOD (PORK) LOCK FLUSH 100 UNIT/ML IV SOLN
500.0000 [IU] | Freq: Once | INTRAVENOUS | Status: AC
  Administered 2021-03-11: 500 [IU] via INTRAVENOUS
  Filled 2021-03-11: qty 5

## 2021-03-11 MED ORDER — SODIUM CHLORIDE 0.9 % IV SOLN
10.0000 mg | Freq: Once | INTRAVENOUS | Status: AC
  Administered 2021-03-11: 10 mg via INTRAVENOUS
  Filled 2021-03-11: qty 1

## 2021-03-11 MED ORDER — SODIUM CHLORIDE 0.9 % IV SOLN
416.5000 mg | Freq: Once | INTRAVENOUS | Status: AC
  Administered 2021-03-11: 420 mg via INTRAVENOUS
  Filled 2021-03-11: qty 42

## 2021-03-11 MED ORDER — SODIUM CHLORIDE 0.9 % IV SOLN
200.0000 mg | Freq: Once | INTRAVENOUS | Status: AC
  Administered 2021-03-11: 200 mg via INTRAVENOUS
  Filled 2021-03-11: qty 8

## 2021-03-11 MED ORDER — PALONOSETRON HCL INJECTION 0.25 MG/5ML
0.2500 mg | Freq: Once | INTRAVENOUS | Status: AC
  Administered 2021-03-11: 0.25 mg via INTRAVENOUS
  Filled 2021-03-11: qty 5

## 2021-03-11 MED ORDER — SODIUM CHLORIDE 0.9 % IV SOLN
150.0000 mg | Freq: Once | INTRAVENOUS | Status: AC
  Administered 2021-03-11: 150 mg via INTRAVENOUS
  Filled 2021-03-11: qty 5

## 2021-03-11 MED ORDER — DIPHENHYDRAMINE HCL 50 MG/ML IJ SOLN
50.0000 mg | Freq: Once | INTRAMUSCULAR | Status: AC
  Administered 2021-03-11: 50 mg via INTRAVENOUS
  Filled 2021-03-11: qty 1

## 2021-03-11 MED ORDER — SODIUM CHLORIDE 0.9% FLUSH
10.0000 mL | Freq: Once | INTRAVENOUS | Status: AC
  Administered 2021-03-11: 10 mL via INTRAVENOUS
  Filled 2021-03-11: qty 10

## 2021-03-11 MED ORDER — SODIUM CHLORIDE 0.9 % IV SOLN
Freq: Once | INTRAVENOUS | Status: AC
  Filled 2021-03-11: qty 250

## 2021-03-11 MED ORDER — KETOROLAC TROMETHAMINE 15 MG/ML IJ SOLN
30.0000 mg | Freq: Once | INTRAMUSCULAR | Status: AC
  Administered 2021-03-11: 30 mg via INTRAVENOUS
  Filled 2021-03-11: qty 2

## 2021-03-11 MED ORDER — FAMOTIDINE 20 MG IN NS 100 ML IVPB
20.0000 mg | Freq: Once | INTRAVENOUS | Status: AC
  Administered 2021-03-11: 20 mg via INTRAVENOUS
  Filled 2021-03-11: qty 2

## 2021-03-11 NOTE — Telephone Encounter (Signed)
03/11/22 orders placed for STAT Bone scan whole body, breast US and diag mammo. Bone scan scheduled 1/3 due to radiology needing to order supplies for scan and closed on 1/2 due to holiday. [facility] is acquiring records from [facility] from last mammo, they will reach out to pt to schedule. They are aware the orders are STAT

## 2021-03-11 NOTE — Progress Notes (Signed)
Patient here for new tx. Pt reports bleeding from right nipple.

## 2021-03-11 NOTE — Progress Notes (Signed)
Hematology/Oncology Consult note [phone] [phone]      Patient Care Team: [name], NP as PCP - General (Nurse Practitioner)  REFERRING PROVIDER: [name], NP  CHIEF COMPLAINTS/REASON FOR VISIT:  Follow-up for triple negative right breast cancer.  HISTORY OF PRESENTING ILLNESS:   Yvonne Scott is a  60 y.o.  female with PMH listed below was seen in consultation at the request of  [name], NP  for evaluation of breast cancer.  August 2022, patient was diagnosed with right breast stage IIIb cT3 N0M0, grade 2, ER negative, PR negative HER2 negative breast cancer.  Patient self palpated breast mass many years ago.  10/21/2020 diagnostic mammogram and ultrasound confirmed presence of mass and a subsequent biopsy revealed right breast triple negative cancer. There was plan for patient to start with neoadjuvant chemotherapy followed by surgery and radiation.  Unfortunately patient never started treatment.  Patient was accompanied by her mother.  They want to transfer her oncology care to locally.  Patient reports having right breast pain as well as generalized pain.  Patient had a [name] placed 3 weeks ago and she reports some swelling around the sites.  Patient has had negative genetic testing at [facility].  Patient has a history of alcohol dependence.  Per patient she has not been drinking recently.  Everyday smoker.  INTERVAL HISTORY Yvonne Scott is a 60 y.o. female who has above history reviewed by me today presents for follow up visit for management of triple negative breast cancer, stage IIIb  02/24/2021, CT chest abdomen pelvis showed large right breast mass, 5.5 x 4.8 cm with small right axillary lymph node with variable degrees of enhancement potentially involved but not specific based on size.  This tracked down to retropectoral nodal stations largest lymph node along the lateral margin of the pectoralis major.  Tiny pulmonary  nodules in the chest as discussed.  These are nonspecific but would consider short interval follow-up to assess for any changes.  No evidence of metastatic disease involving the abdomen or pelvis.  Aortic atherosclerosis.  02/25/2021, echocardiogram showed LVEF 65 to 70%.  Patient has been to chemotherapy class and understands antiemetics instructions.  Review of Systems  Constitutional:  Negative for appetite change, chills, fatigue and fever.  HENT:   Negative for hearing loss and voice change.   Eyes:  Negative for eye problems.  Respiratory:  Negative for chest tightness and cough.   Cardiovascular:  Negative for chest pain.  Gastrointestinal:  Negative for abdominal distention, abdominal pain and blood in stool.  Endocrine: Negative for hot flashes.  Genitourinary:  Negative for difficulty urinating and frequency.   Musculoskeletal:  Negative for arthralgias.       Generalized body aches  Skin:  Negative for itching and rash.  Neurological:  Negative for extremity weakness.  Hematological:  Negative for adenopathy.  Psychiatric/Behavioral:  Negative for confusion.   Right breast mass with nipple discharge/bleeding.  Tender.  MEDICAL HISTORY:  Past Medical History:  Diagnosis Date   Anxiety    Aortic atherosclerosis ([place]) 10/24/2016   Breast cancer ([place])    GERD (gastroesophageal reflux disease)     SURGICAL HISTORY: History reviewed. No pertinent surgical history.  SOCIAL HISTORY: Social History   Socioeconomic History   Marital status: Widowed    Spouse name: Not on file   Number of children: Not on file   Years of education: Not on file   Highest education level: Not on file  Occupational History   Not on file  Tobacco Use   Smoking status: Every Day    Packs/day: 1.00    Years: 36.00    Pack years: 36.00    Types: Cigarettes    Start date: 03/15/1979   Smokeless tobacco: Never  Vaping Use   Vaping Use: Never used  Substance and Sexual Activity   Alcohol  use: No    Alcohol/week: 3.0 standard drinks    Types: 3 Standard drinks or equivalent per week   Drug use: Not Currently   Sexual activity: Not Currently    Birth control/protection: None  Other Topics Concern   Not on file  Social History Narrative   Not on file   Social Determinants of Health   Financial Resource Strain: Not on file  Food Insecurity: Not on file  Transportation Needs: Not on file  Physical Activity: Not on file  Stress: Not on file  Social Connections: Not on file  Intimate Partner Violence: Not on file    FAMILY HISTORY: Family History  Problem Relation Age of Onset   Cancer Mother 22       breast   Cancer Father        melonma   Cirrhosis Maternal Grandfather    Cancer Paternal Grandmother        skin/breast    ALLERGIES:  has No Known Allergies.  MEDICATIONS:  Current Outpatient Medications  Medication Sig Dispense Refill   dexamethasone (DECADRON) 4 MG tablet Take 2 tablets once a day for 2 days after carboplatin and AC chemotherapy. Take with food. 30 tablet 1   folic acid (FOLVITE) 1 MG tablet Take 1 tablet (1 mg total) by mouth daily. 30 tablet 0   gabapentin (NEURONTIN) 100 MG capsule Take 1 capsule (100 mg total) by mouth 3 (three) times daily. 30 capsule 0   guaiFENesin (MUCINEX) 600 MG 12 hr tablet Take 2 tablets (1,200 mg total) by mouth 2 (two) times daily. 60 tablet 0   ibuprofen (ADVIL) 200 MG tablet Take 200 mg by mouth every 6 (six) hours as needed for headache.     lidocaine-prilocaine (EMLA) cream Apply to affected area once 30 g 3   Multiple Vitamin (MULTIVITAMIN WITH MINERALS) TABS tablet Take 1 tablet by mouth daily. 30 tablet 0   omeprazole (PRILOSEC) 20 MG capsule Take 20 mg by mouth daily as needed (reflux).     ondansetron (ZOFRAN) 8 MG tablet Take by mouth.     prochlorperazine (COMPAZINE) 10 MG tablet Take by mouth.     thiamine 100 MG tablet Take 0.5 tablets (50 mg total) by mouth daily. 15 tablet 0   ondansetron  (ZOFRAN) 8 MG tablet Take 1 tablet (8 mg total) by mouth 2 (two) times daily as needed. Start on the third day after carboplatin and AC chemotherapy. (Patient not taking: Reported on 03/11/2021) 30 tablet 1   prochlorperazine (COMPAZINE) 10 MG tablet Take 1 tablet (10 mg total) by mouth every 6 (six) hours as needed (Nausea or vomiting). (Patient not taking: Reported on 03/11/2021) 30 tablet 1   No current facility-administered medications for this visit.     PHYSICAL EXAMINATION: ECOG PERFORMANCE STATUS: 1 - Symptomatic but completely ambulatory Vitals:   03/11/21 0902  BP: 114/74  Pulse: 94  Resp: 18  Temp: 98.1 F (36.7 C)   Filed Weights   03/11/21 0902  Weight: 105 lb 11.2 oz (47.9 kg)    Physical Exam Constitutional:      General:  She is not in acute distress. HENT:     Head: Normocephalic and atraumatic.  Eyes:     General: No scleral icterus. Cardiovascular:     Rate and Rhythm: Normal rate and regular rhythm.     Heart sounds: Normal heart sounds.  Pulmonary:     Effort: Pulmonary effort is normal. No respiratory distress.     Breath sounds: No wheezing.  Abdominal:     General: Bowel sounds are normal. There is no distension.     Palpations: Abdomen is soft.  Musculoskeletal:        General: No deformity. Normal range of motion.     Cervical back: Normal range of motion and neck supple.  Skin:    General: Skin is warm and dry.     Findings: No erythema or rash.  Neurological:     Mental Status: She is alert and oriented to person, place, and time. Mental status is at baseline.     Cranial Nerves: No cranial nerve deficit.     Coordination: Coordination normal.  Psychiatric:     Comments: anxious  Breast exam was performed in seated and lying down position. Right breast fixed mass,  tender.  Right nipple discharge.  No palpable breast mass on the left side. I do not palpate axillary lymphadenopathy bilaterally.  LABORATORY DATA:  I have reviewed the  data as listed Lab Results  Component Value Date   WBC 8.4 03/11/2021   HGB 13.3 03/11/2021   HCT 39.4 03/11/2021   MCV 90.0 03/11/2021   PLT 309 03/11/2021   Recent Labs    02/19/21 1100 03/11/21 0845  NA 135 136  K 3.7 3.5  CL 102 104  CO2 22 21*  GLUCOSE 89 98  BUN 11 11  CREATININE 0.66 0.54  CALCIUM 8.9 8.9  GFRNONAA >60 >60  PROT 7.2 6.9  ALBUMIN 3.8 3.8  AST 16 16  ALT 10 9  ALKPHOS 62 63  BILITOT 0.6 0.4    Iron/TIBC/Ferritin/ %Sat    Component Value Date/Time   IRON 80 10/24/2016 1534   TIBC 282 10/24/2016 1534   FERRITIN 282 (H) 11/17/2016 1404   IRONPCTSAT 28 10/24/2016 1534       RADIOGRAPHIC STUDIES: I have personally reviewed the radiological images as listed and agreed with the findings in the report. CT CHEST ABDOMEN PELVIS W CONTRAST  Result Date: 02/24/2021 CLINICAL DATA:  Triple negative breast cancer in a 60 year old fat select fat female, recent diagnosis. EXAM: CT CHEST, ABDOMEN, AND PELVIS WITH CONTRAST TECHNIQUE: Multidetector CT imaging of the chest, abdomen and pelvis was performed following the standard protocol during bolus administration of intravenous contrast. CONTRAST:  13m OMNIPAQUE IOHEXOL 300 MG/ML  SOLN COMPARISON:  October 16, 2018. FINDINGS: CT CHEST FINDINGS Cardiovascular: Calcified atheromatous plaque and noncalcified plaque in the thoracic aorta. No aneurysm. Normal heart size. No substantial pericardial effusion. Normal caliber of the central pulmonary vasculature. LEFT-sided Port-A-Cath terminates at the caval to atrial junction. Mediastinum/Nodes: No internal mammary adenopathy. No mediastinal adenopathy.  Esophagus grossly normal. Small RIGHT axillary lymph nodes measuring between 6 and 7 mm are nonspecific, seen in the context of an irregular peripherally enhancing mass in the RIGHT breast measuring 5.5 x 4.8 cm. Lungs/Pleura: Tiny RIGHT basilar pulmonary nodule just above the RIGHT hemidiaphragm (image 133/4) 3 mm. Tiny  RIGHT lower lobe nodule (image 94/4) potentially with calcification at 1-2 mm. Small RIGHT upper lobe nodule (image 40/4) 3 mm. Mild basilar atelectasis. No effusion. No consolidation.  Airways are patent. Musculoskeletal: See below for full musculoskeletal details. RIGHT breast mass as discussed above. CT ABDOMEN PELVIS FINDINGS Hepatobiliary: No focal, suspicious hepatic lesion. No pericholecystic stranding. No biliary duct dilation. Portal vein is patent. Pancreas: Normal, without mass, inflammation or ductal dilatation. Spleen: Spleen normal size and contour without focal lesion. Adrenals/Urinary Tract: Adrenal glands are unremarkable. Symmetric renal enhancement. No sign of hydronephrosis. No suspicious renal lesion or perinephric stranding. Urinary bladder is grossly unremarkable. Stomach/Bowel: No acute gastrointestinal finding.  Normal appendix. Vascular/Lymphatic: Aortic atherosclerosis. No sign of aneurysm. Smooth contour of the IVC. There is no gastrohepatic or hepatoduodenal ligament lymphadenopathy. No retroperitoneal or mesenteric lymphadenopathy. No pelvic sidewall lymphadenopathy. Atherosclerotic changes are moderate to marked with calcified and noncalcified plaque in the abdominal aorta. Reproductive: Unremarkable by CT.  No adnexal mass. Other: No ascites.  No free air. Musculoskeletal: No acute bone finding. No destructive bone process. Spinal degenerative changes. IMPRESSION: Large RIGHT breast mass with small RIGHT axillary lymph nodes with variable degrees of enhancement potentially involved but nonspecific based on size. These track towards retropectoral nodal stations largest lymph node along the lateral margin of the pectoralis major. Tiny pulmonary nodules in the chest as discussed. These are nonspecific but would consider short interval follow-up to assess for any changes. No evidence of metastatic disease involving the abdomen or pelvis. Aortic atherosclerosis. Aortic Atherosclerosis  (ICD10-I70.0). Electronically Signed   By: Zetta Bills M.D.   On: 02/24/2021 10:47   ECHOCARDIOGRAM COMPLETE  Result Date: 02/25/2021    ECHOCARDIOGRAM REPORT   Patient Name:   NYKIA TURKO Date of Exam: 02/25/2021 Medical Rec #:  [id number]        Height:       64.0 in Accession #:    [id number]       Weight:       110.1 lb Date of Birth:  [birth year]       BSA:          1.518 m Patient Age:    77 years         BP:           123/79 mmHg Patient Gender: F                HR:           96 bpm. Exam Location:  [facility] Procedure: 2D Echo, Color Doppler and Cardiac Doppler Indications:     Chemo Z09  History:         Patient has no prior history of Echocardiogram examinations.                  Anxiety, GERD.  Sonographer:     Sherrie Sport Referring Phys:  [id number] [place] Diagnosing Phys: Serafina Royals MD  Sonographer Comments: Suboptimal apical window. IMPRESSIONS  1. Left ventricular ejection fraction, by estimation, is 65 to 70%. The left ventricle has normal function. The left ventricle has no regional wall motion abnormalities. Left ventricular diastolic parameters were normal.  2. Right ventricular systolic function is normal. The right ventricular size is normal.  3. The mitral valve is normal in structure. Trivial mitral valve regurgitation.  4. The aortic valve is normal in structure. Aortic valve regurgitation is not visualized. FINDINGS  Left Ventricle: Left ventricular ejection fraction, by estimation, is 65 to 70%. The left ventricle has normal function. The left ventricle has no regional wall motion abnormalities. The left ventricular internal cavity size was normal in size. There is  no  left ventricular hypertrophy. Left ventricular diastolic parameters were normal. Right Ventricle: The right ventricular size is normal. No increase in right ventricular wall thickness. Right ventricular systolic function is normal. Left Atrium: Left atrial size was normal in size. Right Atrium: Right atrial size was  normal in size. Pericardium: There is no evidence of pericardial effusion. Mitral Valve: The mitral valve is normal in structure. Trivial mitral valve regurgitation. MV peak gradient, 2.3 mmHg. The mean mitral valve gradient is 1.0 mmHg. Tricuspid Valve: The tricuspid valve is normal in structure. Tricuspid valve regurgitation is trivial. Aortic Valve: The aortic valve is normal in structure. Aortic valve regurgitation is not visualized. Aortic valve mean gradient measures 3.0 mmHg. Aortic valve peak gradient measures 5.1 mmHg. Aortic valve area, by VTI measures 2.72 cm. Pulmonic Valve: The pulmonic valve was normal in structure. Pulmonic valve regurgitation is not visualized. Aorta: The aortic root and ascending aorta are structurally normal, with no evidence of dilitation. IAS/Shunts: No atrial level shunt detected by color flow Doppler.  LEFT VENTRICLE PLAX 2D LVIDd:         3.80 cm   Diastology LVIDs:         2.50 cm   LV e' medial:    7.18 cm/s LV PW:         1.20 cm   LV E/e' medial:  8.6 LV IVS:        0.65 cm   LV e' lateral:   13.10 cm/s LVOT diam:     2.00 cm   LV E/e' lateral: 4.7 LV SV:         62 LV SV Index:   41 LVOT Area:     3.14 cm  RIGHT VENTRICLE RV Basal diam:  2.90 cm RV S prime:     13.70 cm/s TAPSE (M-mode): 2.9 cm LEFT ATRIUM             Index        RIGHT ATRIUM           Index LA diam:        2.90 cm 1.91 cm/m   RA Area:     16.80 cm LA Vol (A2C):   36.6 ml 24.11 ml/m  RA Volume:   45.20 ml  29.78 ml/m LA Vol (A4C):   23.0 ml 15.15 ml/m LA Biplane Vol: 28.9 ml 19.04 ml/m  AORTIC VALVE                    PULMONIC VALVE AV Area (Vmax):    3.00 cm     PV Vmax:        0.64 m/s AV Area (Vmean):   2.61 cm     PV Vmean:       43.950 cm/s AV Area (VTI):     2.72 cm     PV VTI:         0.126 m AV Vmax:           113.00 cm/s  PV Peak grad:   1.6 mmHg AV Vmean:          82.200 cm/s  PV Mean grad:   1.0 mmHg AV VTI:            0.229 m      RVOT Peak grad: 3 mmHg AV Peak Grad:      5.1 mmHg  AV Mean Grad:      3.0 mmHg LVOT Vmax:         108.00  cm/s LVOT Vmean:        68.400 cm/s LVOT VTI:          0.198 m LVOT/AV VTI ratio: 0.86  AORTA Ao Root diam: 2.90 cm MITRAL VALVE MV Area (PHT): 3.87 cm    SHUNTS MV Area VTI:   3.81 cm    Systemic VTI:  0.20 m MV Peak grad:  2.3 mmHg    Systemic Diam: 2.00 cm MV Mean grad:  1.0 mmHg    Pulmonic VTI:  0.160 m MV Vmax:       0.76 m/s MV Vmean:      52.5 cm/s MV Decel Time: 196 msec MV E velocity: 61.70 cm/s MV A velocity: 55.30 cm/s MV E/A ratio:  1.12 Serafina Royals MD Electronically signed by Serafina Royals MD Signature Date/Time: 02/25/2021/11:47:26 AM    Final       ASSESSMENT & PLAN:  1. Invasive ductal carcinoma of right breast ([place])   2. Neoplasm related pain   3. Generalized body aches   4. Goals of care, counseling/discussion   5. Encounter for antineoplastic chemotherapy    Cancer Staging  Invasive ductal carcinoma of right breast [facility]) Staging form: Breast, AJCC 8th Edition - Clinical stage from 02/19/2021: Stage IIIB (cT3, cN0, cM0, G2, ER-, PR-, HER2-) - Signed by Earlie Server, MD on 02/20/2021  #Clinical stage III triple negative right breast cancer. cT3 cN0-1 Baseline elevated CA 15-3 and CA 27.29. CT scan was independently reviewed by me and discussed with patient. It has been 4+ months since her diagnosis of triple negative breast cancer.I am concerned that her disease has progressed I will repeat diagnostic mammogram of the right breast with ultrasound.  Right axillary lymphadenopathy, plan to biopsy after imaging.  Meanwhile, in order not to further delay her treatment, I recommend patient to proceed with first cycle of neoadjuvant chemotherapy.  Labs reviewed and discussed with patient. Proceed with cycle 1 day 1 pembrolizumab, Taxol, carboplatin.  Rationale and potential side effects were reviewed and discussed with patient.  She agrees with the plan. We discussed about instructions of antiemetics.  #Generalized body ache,  right breast pain, obtain bone scan. Continue gabapentin.  Continue follow-up with palliative care service.    Orders Placed This Encounter  Procedures   NM Bone Scan Whole Body    Standing Status:   Future    Standing Expiration Date:   03/11/2022    Order Specific Question:   If indicated for the ordered procedure, I authorize the administration of a radiopharmaceutical per Radiology protocol    Answer:   Yes    Order Specific Question:   Preferred imaging location?    Answer:   [facility]   MM DIAG BREAST TOMO UNI RIGHT    Standing Status:   Future    Standing Expiration Date:   03/11/2022    Order Specific Question:   Reason for Exam (SYMPTOM  OR DIAGNOSIS REQUIRED)    Answer:   triple neg breast cancer: RIGHT axillary lymph nodes measuring between 6 and 7 mm    Order Specific Question:   Preferred imaging location?    Answer:   [facility]   US BREAST LTD UNI RIGHT INC AXILLA    Standing Status:   Future    Standing Expiration Date:   03/11/2022    Order Specific Question:   Reason for Exam (SYMPTOM  OR DIAGNOSIS REQUIRED)    Answer:   triple negative breast cancer: RIGHT axillary lymph nodes measuring  between 6 and 7 mm    Order Specific Question:   Preferred imaging location?    Answer:   [facility]    All questions were answered. The patient knows to call the clinic with any problems questions or concerns.  cc [name], NP    Return of visit: 1 week for lab md Taxol   Earlie Server, MD, PhD  03/11/2021

## 2021-03-11 NOTE — Patient Instructions (Signed)
[facility] CANCER CTR AT [place]  Discharge Instructions: Thank you for choosing [facility] to provide your oncology and hematology care.  If you have a lab appointment with the [facility], please go directly to the [place] [place] and check in at the registration area.  Wear comfortable clothing and clothing appropriate for easy access to any Portacath or PICC line.   We strive to give you quality time with your provider. You may need to reschedule your appointment if you arrive late (15 or more minutes).  Arriving late affects you and other patients whose appointments are after yours.  Also, if you miss three or more appointments without notifying the office, you may be dismissed from the clinic at the providers discretion.      For prescription refill requests, have your pharmacy contact our office and allow 72 hours for refills to be completed.    Today you received the following chemotherapy and/or immunotherapy agents ; Keytruda / Taxol / Carboplatin     To help prevent nausea and vomiting after your treatment, we encourage you to take your nausea medication as directed.  BELOW ARE SYMPTOMS THAT SHOULD BE REPORTED IMMEDIATELY: *FEVER GREATER THAN 100.4 F (38 C) OR HIGHER *CHILLS OR SWEATING *NAUSEA AND VOMITING THAT IS NOT CONTROLLED WITH YOUR NAUSEA MEDICATION *UNUSUAL SHORTNESS OF BREATH *UNUSUAL BRUISING OR BLEEDING *URINARY PROBLEMS (pain or burning when urinating, or frequent urination) *BOWEL PROBLEMS (unusual diarrhea, constipation, pain near the anus) TENDERNESS IN MOUTH AND THROAT WITH OR WITHOUT PRESENCE OF ULCERS (sore throat, sores in mouth, or a toothache) UNUSUAL RASH, SWELLING OR PAIN  UNUSUAL VAGINAL DISCHARGE OR ITCHING   Items with * indicate a potential emergency and should be followed up as soon as possible or go to the Emergency Department if any problems should occur.  Please show the CHEMOTHERAPY ALERT CARD or IMMUNOTHERAPY ALERT  CARD at check-in to the Emergency Department and triage nurse.  Should you have questions after your visit or need to cancel or reschedule your appointment, please contact [facility] CANCER [place] AT [place]  [phone] and follow the prompts.  Office hours are 8:00 a.m. to 4:30 p.m. Monday - Friday. Please note that voicemails left after 4:00 p.m. may not be returned until the following business day.  We are closed weekends and major holidays. You have access to a nurse at all times for urgent questions. Please call the main number to the clinic [phone] and follow the prompts.  For any non-urgent questions, you may also contact your provider using MyChart. We now offer e-Visits for anyone 82 and older to request care online for non-urgent symptoms. For details visit [email or web address].   Also download the MyChart app! Go to the app store, search "MyChart", open the app, select [place] [place], and log in with your MyChart username and password.  Due to Covid, a mask is required upon entering the hospital/clinic. If you do not have a mask, one will be given to you upon arrival. For doctor visits, patients may have 1 support person aged 46 or older with them. For treatment visits, patients cannot have anyone with them due to current Covid guidelines and our immunocompromised population.

## 2021-03-12 ENCOUNTER — Other Ambulatory Visit: Payer: Self-pay

## 2021-03-12 ENCOUNTER — Telehealth: Payer: Self-pay | Admitting: *Deleted

## 2021-03-12 LAB — T4: T4, Total: 8 ug/dL (ref 4.5–12.0)

## 2021-03-12 MED ORDER — MAGIC MOUTHWASH W/LIDOCAINE: 5.0000 mL | Freq: Four times a day (QID) | ORAL | 1 refills | Status: DC | PRN

## 2021-03-12 NOTE — Telephone Encounter (Signed)
Spoke with Dr. Tasia Catchings. MD approved script for magic mouth wash to be sent to pharmacy. Benjamine Mola, RN faxed the script to [facility].[address] I called patient back and notified her that the script was sent. She thanked me for updating her and will pick up rx.

## 2021-03-12 NOTE — Telephone Encounter (Signed)
RN contacted patient after her first chemotherapy treatment (on 03/11/21) (keytruda/carbo/taxol). Patient tolerated the treatment well. She denies any N&V or Diarrhea, fevers, flushing or rashes. She is staying with her mother this weekend as she felt that she needed someone to be with her after the first treatment.  Pt has her routine meds, antiemetics and decadron with her during this stay.   Reviewed/reiterated with patient the purpose/instructions for taking the decadron to prevent side effects of the chemotherapy. She asked me to review the instructions for the Zofran. She confirmed that she will start taking the zofran tomorrow as directed unless she becomes nauseated today. She does have compazine on hand should the zofran not relieve her nausea. She gave verbal understanding of the instructions and thanked me for reviewing these with her.  She stated that she used metal utensils yesterday evening and forgot that she was not supposed to use this. She experienced a strong metallic taste in her mouth. She will switch to plastic ware until she has completed her treatment regimens. She admits to having canker sore and dry mouth. She forgot to show this to Dr. Tasia Catchings yesterday. She feels that the mouth sores are getting worse today. She would like to be proactive with an rx medicated mouth rinse if possible in case the mouth sores get worse over the weekend.   Discussed with patient to avoid alcohol based mouth rinses and stressed good oral hygiene using a soft tooth brush/floss. She may use baking soda mouth rinses as needed. Discussed products such as biotene to help with dry mouth. Patient gave verbal understanding and thanked me for reviewing this information with her.  Discussed that Dr. Rogue Bussing is on call provider this weekend. Reminded the patient that the [facility] is closed on Monday for the holiday. However, if she has any oncology medical concerns, I encouraged pt to call the main # of the  cancer center to reach the on call doctor. If emergency, she should call 911 or go to the nearest ER. She gave verbal understanding.   RN Will reach out to Dr. Tasia Catchings to see if a script for magic mouth wash can be sent to patient's pharmacy to assist with mouthsores. I will call patient back once I have spoke with Dr. Tasia Catchings and her team.

## 2021-03-16 ENCOUNTER — Encounter
Admission: RE | Admit: 2021-03-16 | Discharge: 2021-03-16 | Disposition: A | Discharge status: Home / Self Care | Payer: Medicaid Other | Source: Ambulatory Visit | Attending: Oncology | Admitting: Oncology

## 2021-03-16 ENCOUNTER — Other Ambulatory Visit: Payer: Self-pay

## 2021-03-16 ENCOUNTER — Inpatient Hospital Stay: Payer: Medicaid Other | Attending: Oncology | Admitting: Hospice and Palliative Medicine

## 2021-03-16 DIAGNOSIS — Z515 Encounter for palliative care: Secondary | ICD-10-CM

## 2021-03-16 DIAGNOSIS — G893 Neoplasm related pain (acute) (chronic): Secondary | ICD-10-CM | POA: Insufficient documentation

## 2021-03-16 DIAGNOSIS — F17210 Nicotine dependence, cigarettes, uncomplicated: Secondary | ICD-10-CM | POA: Insufficient documentation

## 2021-03-16 DIAGNOSIS — Z5112 Encounter for antineoplastic immunotherapy: Secondary | ICD-10-CM | POA: Insufficient documentation

## 2021-03-16 DIAGNOSIS — Z171 Estrogen receptor negative status [ER-]: Secondary | ICD-10-CM | POA: Insufficient documentation

## 2021-03-16 DIAGNOSIS — H9213 Otorrhea, bilateral: Secondary | ICD-10-CM | POA: Insufficient documentation

## 2021-03-16 DIAGNOSIS — Z5189 Encounter for other specified aftercare: Secondary | ICD-10-CM | POA: Insufficient documentation

## 2021-03-16 DIAGNOSIS — Z5111 Encounter for antineoplastic chemotherapy: Secondary | ICD-10-CM | POA: Insufficient documentation

## 2021-03-16 DIAGNOSIS — C50911 Malignant neoplasm of unspecified site of right female breast: Secondary | ICD-10-CM | POA: Diagnosis not present

## 2021-03-16 DIAGNOSIS — Z79899 Other long term (current) drug therapy: Secondary | ICD-10-CM | POA: Insufficient documentation

## 2021-03-16 DIAGNOSIS — J019 Acute sinusitis, unspecified: Secondary | ICD-10-CM | POA: Insufficient documentation

## 2021-03-16 DIAGNOSIS — R112 Nausea with vomiting, unspecified: Secondary | ICD-10-CM | POA: Insufficient documentation

## 2021-03-16 DIAGNOSIS — E876 Hypokalemia: Secondary | ICD-10-CM | POA: Insufficient documentation

## 2021-03-16 MED ORDER — TECHNETIUM TC 99M MEDRONATE IV KIT
20.0000 | PACK | Freq: Once | INTRAVENOUS | Status: AC | PRN
  Administered 2021-03-16: 20.46 via INTRAVENOUS

## 2021-03-16 NOTE — Progress Notes (Signed)
Unable to reach patient.  Voicemail left.  Will reschedule. °

## 2021-03-17 ENCOUNTER — Inpatient Hospital Stay
Admission: RE | Admit: 2021-03-17 | Discharge: 2021-03-17 | Disposition: A | Discharge status: Home / Self Care | Payer: Self-pay | Source: Ambulatory Visit | Attending: *Deleted | Admitting: *Deleted

## 2021-03-17 ENCOUNTER — Other Ambulatory Visit: Payer: Self-pay | Admitting: *Deleted

## 2021-03-17 ENCOUNTER — Inpatient Hospital Stay: Payer: Medicaid Other | Admitting: Hospice and Palliative Medicine

## 2021-03-17 DIAGNOSIS — Z1231 Encounter for screening mammogram for malignant neoplasm of breast: Secondary | ICD-10-CM

## 2021-03-17 DIAGNOSIS — C50911 Malignant neoplasm of unspecified site of right female breast: Secondary | ICD-10-CM

## 2021-03-17 NOTE — Progress Notes (Signed)
Multidisciplinary Oncology Council Documentation  Yvonne Scott was presented by our [facility] on 03/17/2021, which included representatives from:  Palliative Care Dietitian  Physical/Occupational Therapist Nurse Navigator Genetics Speech Therapist Social work Survivorship RN Financial Navigator Research RN   [name] currently presents with history of breast cancer.  We reviewed previous medical and familial history, history of present illness, and recent lab results along with all available histopathologic and imaging studies. The [facility] [place] considered available treatment options and made the following recommendations/referrals:  -referral to nutrition to assess nutritional status during treatment. -social worker referral to provide social support and resources.  The MOC is a meeting of clinicians from various specialty areas who evaluate and discuss patients for whom a multidisciplinary approach is being considered. Final determinations in the plan of care are those of the provider(s).   Today's extended care, comprehensive team conference, [name] was not present for the discussion and was not examined.

## 2021-03-18 ENCOUNTER — Other Ambulatory Visit: Payer: Self-pay

## 2021-03-18 ENCOUNTER — Encounter: Payer: Self-pay | Admitting: Oncology

## 2021-03-18 ENCOUNTER — Inpatient Hospital Stay: Payer: Medicaid Other

## 2021-03-18 ENCOUNTER — Inpatient Hospital Stay (HOSPITAL_BASED_OUTPATIENT_CLINIC_OR_DEPARTMENT_OTHER): Payer: Medicaid Other | Admitting: Oncology

## 2021-03-18 VITALS — BP 103/69 | HR 81

## 2021-03-18 VITALS — BP 116/79 | HR 97 | Temp 99.5°F | Wt 103.2 lb

## 2021-03-18 DIAGNOSIS — E876 Hypokalemia: Secondary | ICD-10-CM

## 2021-03-18 DIAGNOSIS — Z5189 Encounter for other specified aftercare: Secondary | ICD-10-CM | POA: Diagnosis not present

## 2021-03-18 DIAGNOSIS — C50911 Malignant neoplasm of unspecified site of right female breast: Secondary | ICD-10-CM | POA: Diagnosis not present

## 2021-03-18 DIAGNOSIS — F17210 Nicotine dependence, cigarettes, uncomplicated: Secondary | ICD-10-CM | POA: Diagnosis not present

## 2021-03-18 DIAGNOSIS — R112 Nausea with vomiting, unspecified: Secondary | ICD-10-CM | POA: Diagnosis not present

## 2021-03-18 DIAGNOSIS — Z79899 Other long term (current) drug therapy: Secondary | ICD-10-CM | POA: Diagnosis not present

## 2021-03-18 DIAGNOSIS — Z95828 Presence of other vascular implants and grafts: Secondary | ICD-10-CM

## 2021-03-18 DIAGNOSIS — N179 Acute kidney failure, unspecified: Secondary | ICD-10-CM

## 2021-03-18 DIAGNOSIS — J019 Acute sinusitis, unspecified: Secondary | ICD-10-CM | POA: Diagnosis not present

## 2021-03-18 DIAGNOSIS — R52 Pain, unspecified: Secondary | ICD-10-CM

## 2021-03-18 DIAGNOSIS — H9213 Otorrhea, bilateral: Secondary | ICD-10-CM | POA: Diagnosis not present

## 2021-03-18 DIAGNOSIS — Z5112 Encounter for antineoplastic immunotherapy: Secondary | ICD-10-CM | POA: Diagnosis not present

## 2021-03-18 DIAGNOSIS — G893 Neoplasm related pain (acute) (chronic): Secondary | ICD-10-CM

## 2021-03-18 DIAGNOSIS — Z171 Estrogen receptor negative status [ER-]: Secondary | ICD-10-CM | POA: Diagnosis not present

## 2021-03-18 DIAGNOSIS — Z5111 Encounter for antineoplastic chemotherapy: Secondary | ICD-10-CM | POA: Diagnosis present

## 2021-03-18 LAB — CBC WITH DIFFERENTIAL/PLATELET
Abs Immature Granulocytes: 0.06 10*3/uL (ref 0.00–0.07)
Basophils Absolute: 0 10*3/uL (ref 0.0–0.1)
Basophils Relative: 1 %
Eosinophils Absolute: 0.1 10*3/uL (ref 0.0–0.5)
Eosinophils Relative: 2 %
HCT: 36.5 % (ref 36.0–46.0)
Hemoglobin: 12.6 g/dL (ref 12.0–15.0)
Immature Granulocytes: 1 %
Lymphocytes Relative: 43 %
Lymphs Abs: 2.5 10*3/uL (ref 0.7–4.0)
MCH: 30.6 pg (ref 26.0–34.0)
MCHC: 34.5 g/dL (ref 30.0–36.0)
MCV: 88.6 fL (ref 80.0–100.0)
Monocytes Absolute: 0.2 10*3/uL (ref 0.1–1.0)
Monocytes Relative: 4 %
Neutro Abs: 2.8 10*3/uL (ref 1.7–7.7)
Neutrophils Relative %: 49 %
Platelets: 229 10*3/uL (ref 150–400)
RBC: 4.12 MIL/uL (ref 3.87–5.11)
RDW: 12.8 % (ref 11.5–15.5)
WBC: 5.7 10*3/uL (ref 4.0–10.5)
nRBC: 0 % (ref 0.0–0.2)

## 2021-03-18 LAB — COMPREHENSIVE METABOLIC PANEL
ALT: 13 U/L (ref 0–44)
AST: 19 U/L (ref 15–41)
Albumin: 3.8 g/dL (ref 3.5–5.0)
Alkaline Phosphatase: 58 U/L (ref 38–126)
Anion gap: 15 (ref 5–15)
BUN: 19 mg/dL (ref 8–23)
CO2: 22 mmol/L (ref 22–32)
Calcium: 8.2 mg/dL — ABNORMAL LOW (ref 8.9–10.3)
Chloride: 95 mmol/L — ABNORMAL LOW (ref 98–111)
Creatinine, Ser: 1.23 mg/dL — ABNORMAL HIGH (ref 0.44–1.00)
GFR, Estimated: 50 mL/min — ABNORMAL LOW (ref >60)
Glucose, Bld: 99 mg/dL (ref 70–99)
Potassium: 2.9 mmol/L — ABNORMAL LOW (ref 3.5–5.1)
Sodium: 132 mmol/L — ABNORMAL LOW (ref 135–145)
Total Bilirubin: 0.8 mg/dL (ref 0.3–1.2)
Total Protein: 6.8 g/dL (ref 6.5–8.1)

## 2021-03-18 LAB — MAGNESIUM: Magnesium: 1.6 mg/dL — ABNORMAL LOW (ref 1.7–2.4)

## 2021-03-18 MED ORDER — MAGNESIUM SULFATE 2 GM/50ML IV SOLN
2.0000 g | Freq: Once | INTRAVENOUS | Status: AC
  Administered 2021-03-18: 2 g via INTRAVENOUS
  Filled 2021-03-18: qty 50

## 2021-03-18 MED ORDER — HEPARIN SOD (PORK) LOCK FLUSH 100 UNIT/ML IV SOLN
500.0000 [IU] | Freq: Once | INTRAVENOUS | Status: AC
  Administered 2021-03-18: 500 [IU] via INTRAVENOUS
  Filled 2021-03-18: qty 5

## 2021-03-18 MED ORDER — MAGIC MOUTHWASH W/LIDOCAINE: 5.0000 mL | Freq: Four times a day (QID) | ORAL | 1 refills | Status: DC | PRN

## 2021-03-18 MED ORDER — POTASSIUM CHLORIDE IN NACL 20-0.9 MEQ/L-% IV SOLN
Freq: Once | INTRAVENOUS | Status: AC
  Filled 2021-03-18: qty 1000

## 2021-03-18 MED ORDER — SODIUM CHLORIDE 0.9 % IV SOLN
10.0000 mg | Freq: Once | INTRAVENOUS | Status: AC
  Administered 2021-03-18: 10 mg via INTRAVENOUS
  Filled 2021-03-18: qty 10

## 2021-03-18 MED ORDER — POTASSIUM CHLORIDE CRYS ER 20 MEQ PO TBCR: 20.0000 meq | EXTENDED_RELEASE_TABLET | Freq: Two times a day (BID) | ORAL | 0 refills | Status: DC

## 2021-03-18 NOTE — Progress Notes (Signed)
Patient here for follow up. Patient states she has not been feeling well. No appetite, very fatigue and complains of stomach pain.

## 2021-03-18 NOTE — Progress Notes (Signed)
Hematology/Oncology progress note [phone] [phone]      Patient Care Team: [name], NP as PCP - General (Nurse Practitioner)  REFERRING PROVIDER: [name], NP  CHIEF COMPLAINTS/REASON FOR VISIT:  Follow-up for triple negative right breast cancer.  HISTORY OF PRESENTING ILLNESS:   Yvonne Scott is a  61 y.o.  female with PMH listed below was seen in consultation at the request of  [name], NP  for evaluation of breast cancer.  August 2022, patient was diagnosed with right breast stage IIIb cT3 N0M0, grade 2, ER negative, PR negative HER2 negative breast cancer.  Patient self palpated breast mass many years ago.  10/21/2020 diagnostic mammogram and ultrasound confirmed presence of mass and a subsequent biopsy revealed right breast triple negative cancer. There was plan for patient to start with neoadjuvant chemotherapy followed by surgery and radiation.  Unfortunately patient never started treatment.  Patient was accompanied by her mother.  They want to transfer her oncology care to locally.  Patient reports having right breast pain as well as generalized pain.  Patient had a [name] placed 3 weeks ago and she reports some swelling around the sites.  Patient has had negative genetic testing at [facility]: Lambert Rhonda W.  Patient has a history of alcohol dependence.  Per patient she has not been drinking recently.  Everyday smoker.   02/24/2021, CT chest abdomen pelvis showed large right breast mass, 5.5 x 4.8 cm with small right axillary lymph node with variable degrees of enhancement potentially involved but not specific based on size.  This tracked down to retropectoral nodal stations largest lymph node along the lateral margin of the pectoralis major.  Tiny pulmonary nodules in the chest as discussed.  These are nonspecific but would consider short interval follow-up to assess for any changes.  No evidence of metastatic disease involving the abdomen or  pelvis.  Aortic atherosclerosis.  02/25/2021, echocardiogram showed LVEF 65 to 70%.  Patient has been to chemotherapy class and understands antiemetics instructions.   INTERVAL HISTORY Yvonne Scott is a 61 y.o. female who has above history reviewed by me today presents for follow up visit for management of triple negative breast cancer, stage IIIb Patient is status post cycle 1 day 1 carboplatin/Taxol/Keytruda. Patient feels extremely tired and fatigued few days after the treatments.  Nausea, she took Compazine with some improvement.  Appetite has decreased. 03/16/2021, bone scan showed no evidence of osseous metastatic breast cancer, asymmetric osseous uptake on the right at L5-S1, corresponding to degenerative disc disease on recent CT.  Asymmetric soft tissue uptake in the right breast.  + mouth sore  Review of Systems  Constitutional:  Positive for fatigue. Negative for appetite change, chills and fever.  HENT:   Positive for mouth sores. Negative for hearing loss and voice change.   Eyes:  Negative for eye problems.  Respiratory:  Negative for chest tightness and cough.   Cardiovascular:  Negative for chest pain.  Gastrointestinal:  Positive for nausea. Negative for abdominal distention, abdominal pain and blood in stool.  Endocrine: Negative for hot flashes.  Genitourinary:  Negative for difficulty urinating and frequency.   Musculoskeletal:  Negative for arthralgias.       Generalized body aches  Skin:  Negative for itching and rash.  Neurological:  Negative for extremity weakness.  Hematological:  Negative for adenopathy.  Psychiatric/Behavioral:  Negative for confusion.   Right breast mass with nipple discharge  Tender.  MEDICAL HISTORY:  Past Medical History:  Diagnosis Date   Anxiety    Aortic atherosclerosis ([place]) 10/24/2016   Breast cancer (HCC)    GERD (gastroesophageal reflux disease)     SURGICAL HISTORY: History reviewed. No pertinent surgical  history.  SOCIAL HISTORY: Social History   Socioeconomic History   Marital status: Widowed    Spouse name: Not on file   Number of children: Not on file   Years of education: Not on file   Highest education level: Not on file  Occupational History   Not on file  Tobacco Use   Smoking status: Every Day    Packs/day: 1.00    Years: 36.00    Pack years: 36.00    Types: Cigarettes    Start date: 03/15/1979   Smokeless tobacco: Never  Vaping Use   Vaping Use: Never used  Substance and Sexual Activity   Alcohol use: No    Alcohol/week: 3.0 standard drinks    Types: 3 Standard drinks or equivalent per week   Drug use: Not Currently   Sexual activity: Not Currently    Birth control/protection: None  Other Topics Concern   Not on file  Social History Narrative   Not on file   Social Determinants of Health   Financial Resource Strain: Not on file  Food Insecurity: Not on file  Transportation Needs: Not on file  Physical Activity: Not on file  Stress: Not on file  Social Connections: Not on file  Intimate Partner Violence: Not on file    FAMILY HISTORY: Family History  Problem Relation Age of Onset   Cancer Mother 29       breast   Cancer Father        melonma   Cirrhosis Maternal Grandfather    Cancer Paternal Grandmother        skin/breast    ALLERGIES:  has No Known Allergies.  MEDICATIONS:  Current Outpatient Medications  Medication Sig Dispense Refill   dexamethasone (DECADRON) 4 MG tablet Take 2 tablets once a day for 2 days after carboplatin and AC chemotherapy. Take with food. 30 tablet 1   folic acid (FOLVITE) 1 MG tablet Take 1 tablet (1 mg total) by mouth daily. 30 tablet 0   gabapentin (NEURONTIN) 100 MG capsule Take 1 capsule (100 mg total) by mouth 3 (three) times daily. 30 capsule 0   guaiFENesin (MUCINEX) 600 MG 12 hr tablet Take 2 tablets (1,200 mg total) by mouth 2 (two) times daily. 60 tablet 0   ibuprofen (ADVIL) 200 MG tablet Take 200 mg by  mouth every 6 (six) hours as needed for headache.     lidocaine-prilocaine (EMLA) cream Apply to affected area once 30 g 3   magic mouthwash w/lidocaine SOLN Take 5 mLs by mouth 4 (four) times daily as needed for mouth pain. Sig: Swish/Swallow 5-10 ml four times a day as needed. Dispense 480 ml. 1RF 480 mL 1   Multiple Vitamin (MULTIVITAMIN WITH MINERALS) TABS tablet Take 1 tablet by mouth daily. 30 tablet 0   omeprazole (PRILOSEC) 20 MG capsule Take 20 mg by mouth daily as needed (reflux).     ondansetron (ZOFRAN) 8 MG tablet Take by mouth.     potassium chloride SA (KLOR-CON M) 20 MEQ tablet Take 1 tablet (20 mEq total) by mouth 2 (two) times daily. 60 tablet 0   prochlorperazine (COMPAZINE) 10 MG tablet Take by mouth.     thiamine 100 MG tablet Take 0.5 tablets (50 mg total) by mouth daily. 15 tablet 0  ondansetron (ZOFRAN) 8 MG tablet Take 1 tablet (8 mg total) by mouth 2 (two) times daily as needed. Start on the third day after carboplatin and AC chemotherapy. (Patient not taking: Reported on 03/11/2021) 30 tablet 1   prochlorperazine (COMPAZINE) 10 MG tablet Take 1 tablet (10 mg total) by mouth every 6 (six) hours as needed (Nausea or vomiting). (Patient not taking: Reported on 03/11/2021) 30 tablet 1   No current facility-administered medications for this visit.     PHYSICAL EXAMINATION: ECOG PERFORMANCE STATUS: 1 - Symptomatic but completely ambulatory Vitals:   03/18/21 0903  BP: 116/79  Pulse: 97  Temp: 99.5 F (37.5 C)   Filed Weights   03/18/21 0903  Weight: 103 lb 3.2 oz (46.8 kg)    Physical Exam Constitutional:      General: She is not in acute distress. HENT:     Head: Normocephalic and atraumatic.     Mouth/Throat:     Comments: mucositis Eyes:     General: No scleral icterus. Cardiovascular:     Rate and Rhythm: Normal rate and regular rhythm.     Heart sounds: Normal heart sounds.  Pulmonary:     Effort: Pulmonary effort is normal. No respiratory  distress.     Breath sounds: No wheezing.  Abdominal:     General: Bowel sounds are normal. There is no distension.     Palpations: Abdomen is soft.  Musculoskeletal:        General: No deformity. Normal range of motion.     Cervical back: Normal range of motion and neck supple.  Skin:    General: Skin is warm and dry.     Findings: No erythema or rash.  Neurological:     Mental Status: She is alert and oriented to person, place, and time. Mental status is at baseline.     Cranial Nerves: No cranial nerve deficit.     Coordination: Coordination normal.  Psychiatric:     Comments: anxious  Breast exam was performed in seated and lying down position. Right breast fixed mass,  tender.  Right nipple discharge.  No palpable breast mass on the left side. I do not palpate axillary lymphadenopathy bilaterally.  LABORATORY DATA:  I have reviewed the data as listed Lab Results  Component Value Date   WBC 5.7 03/18/2021   HGB 12.6 03/18/2021   HCT 36.5 03/18/2021   MCV 88.6 03/18/2021   PLT 229 03/18/2021   Recent Labs    02/19/21 1100 03/11/21 0845 03/18/21 0836  NA 135 136 132*  K 3.7 3.5 2.9*  CL 102 104 95*  CO2 22 21* 22  GLUCOSE 89 98 99  BUN _0 CREATININE 0.66 0.54 1.23*  CALCIUM 8.9 8.9 8.2*  GFRNONAA >60 >60 50*  PROT 7.2 6.9 6.8  ALBUMIN 3.8 3.8 3.8  AST _1 ALT _2 ALKPHOS 62 63 58  BILITOT 0.6 0.4 0.8    Iron/TIBC/Ferritin/ %Sat    Component Value Date/Time   IRON 80 10/24/2016 1534   TIBC 282 10/24/2016 1534   FERRITIN 282 (H) 11/17/2016 1404   IRONPCTSAT 28 10/24/2016 1534       RADIOGRAPHIC STUDIES: I have personally reviewed the radiological images as listed and agreed with the findings in the report. NM Bone Scan Whole Body  Result Date: 03/16/2021 CLINICAL DATA:  Invasive triple negative ductal carcinoma the right breast. Right breast pain radiating into the arm. EXAM: NUCLEAR MEDICINE WHOLE BODY  BONE SCAN TECHNIQUE: Whole  body anterior and posterior images were obtained approximately 3 hours after intravenous injection of radiopharmaceutical. RADIOPHARMACEUTICALS:  20.46 mCi Technetium-49mMDP IV COMPARISON:  CTs of the chest, abdomen and pelvis 02/24/2021. No previous bone scan. FINDINGS: There is no osseous uptake highly suspicious for metastatic disease. There is focal increased uptake at L5-S1 on the right, corresponding with degenerative disc disease and endplate sclerosis on previous CT. Asymmetric right mandibular activity is likely periodontal. Asymmetric soft tissue activity is present within the right breast, corresponding with the known right breast mass and/or subsequent therapy. Soft tissue activity otherwise appears unremarkable. IMPRESSION: 1. No evidence of osseous metastatic breast cancer. 2. Asymmetric osseous uptake on the right at L5-S1, corresponding with degenerative disc disease on recent CT. 3. Asymmetric soft tissue uptake in the right breast. Electronically Signed   By: WRichardean SaleM.D.   On: 03/16/2021 12:36   CT CHEST ABDOMEN PELVIS W CONTRAST  Result Date: 02/24/2021 CLINICAL DATA:  Triple negative breast cancer in a 61year old fat select fat female, recent diagnosis. EXAM: CT CHEST, ABDOMEN, AND PELVIS WITH CONTRAST TECHNIQUE: Multidetector CT imaging of the chest, abdomen and pelvis was performed following the standard protocol during bolus administration of intravenous contrast. CONTRAST:  827mOMNIPAQUE IOHEXOL 300 MG/ML  SOLN COMPARISON:  October 16, 2018. FINDINGS: CT CHEST FINDINGS Cardiovascular: Calcified atheromatous plaque and noncalcified plaque in the thoracic aorta. No aneurysm. Normal heart size. No substantial pericardial effusion. Normal caliber of the central pulmonary vasculature. LEFT-sided Port-A-Cath terminates at the caval to atrial junction. Mediastinum/Nodes: No internal mammary adenopathy. No mediastinal adenopathy.  Esophagus grossly normal. Small RIGHT axillary lymph  nodes measuring between 6 and 7 mm are nonspecific, seen in the context of an irregular peripherally enhancing mass in the RIGHT breast measuring 5.5 x 4.8 cm. Lungs/Pleura: Tiny RIGHT basilar pulmonary nodule just above the RIGHT hemidiaphragm (image 133/4) 3 mm. Tiny RIGHT lower lobe nodule (image 94/4) potentially with calcification at 1-2 mm. Small RIGHT upper lobe nodule (image 40/4) 3 mm. Mild basilar atelectasis. No effusion. No consolidation. Airways are patent. Musculoskeletal: See below for full musculoskeletal details. RIGHT breast mass as discussed above. CT ABDOMEN PELVIS FINDINGS Hepatobiliary: No focal, suspicious hepatic lesion. No pericholecystic stranding. No biliary duct dilation. Portal vein is patent. Pancreas: Normal, without mass, inflammation or ductal dilatation. Spleen: Spleen normal size and contour without focal lesion. Adrenals/Urinary Tract: Adrenal glands are unremarkable. Symmetric renal enhancement. No sign of hydronephrosis. No suspicious renal lesion or perinephric stranding. Urinary bladder is grossly unremarkable. Stomach/Bowel: No acute gastrointestinal finding.  Normal appendix. Vascular/Lymphatic: Aortic atherosclerosis. No sign of aneurysm. Smooth contour of the IVC. There is no gastrohepatic or hepatoduodenal ligament lymphadenopathy. No retroperitoneal or mesenteric lymphadenopathy. No pelvic sidewall lymphadenopathy. Atherosclerotic changes are moderate to marked with calcified and noncalcified plaque in the abdominal aorta. Reproductive: Unremarkable by CT.  No adnexal mass. Other: No ascites.  No free air. Musculoskeletal: No acute bone finding. No destructive bone process. Spinal degenerative changes. IMPRESSION: Large RIGHT breast mass with small RIGHT axillary lymph nodes with variable degrees of enhancement potentially involved but nonspecific based on size. These track towards retropectoral nodal stations largest lymph node along the lateral margin of the pectoralis  major. Tiny pulmonary nodules in the chest as discussed. These are nonspecific but would consider short interval follow-up to assess for any changes. No evidence of metastatic disease involving the abdomen or pelvis. Aortic atherosclerosis. Aortic Atherosclerosis (ICD10-I70.0). Electronically Signed   By: GeZetta Bills  M.D.   On: 02/24/2021 10:47   ECHOCARDIOGRAM COMPLETE  Result Date: 02/25/2021    ECHOCARDIOGRAM REPORT   Patient Name:   Yvonne Scott Date of Exam: 02/25/2021 Medical Rec #:  [id number]        Height:       64.0 in Accession #:    [id number]       Weight:       110.1 lb Date of Birth:  03/19/1960       BSA:          1.518 m Patient Age:    55 years         BP:           123/79 mmHg Patient Gender: F                HR:           96 bpm. Exam Location:  [facility] Procedure: 2D Echo, Color Doppler and Cardiac Doppler Indications:     Chemo Z09  History:         Patient has no prior history of Echocardiogram examinations.                  Anxiety, GERD.  Sonographer:     Sherrie Sport Referring Phys:  [id number] [place] Diagnosing Phys: Serafina Royals MD  Sonographer Comments: Suboptimal apical window. IMPRESSIONS  1. Left ventricular ejection fraction, by estimation, is 65 to 70%. The left ventricle has normal function. The left ventricle has no regional wall motion abnormalities. Left ventricular diastolic parameters were normal.  2. Right ventricular systolic function is normal. The right ventricular size is normal.  3. The mitral valve is normal in structure. Trivial mitral valve regurgitation.  4. The aortic valve is normal in structure. Aortic valve regurgitation is not visualized. FINDINGS  Left Ventricle: Left ventricular ejection fraction, by estimation, is 65 to 70%. The left ventricle has normal function. The left ventricle has no regional wall motion abnormalities. The left ventricular internal cavity size was normal in size. There is  no left ventricular hypertrophy. Left ventricular  diastolic parameters were normal. Right Ventricle: The right ventricular size is normal. No increase in right ventricular wall thickness. Right ventricular systolic function is normal. Left Atrium: Left atrial size was normal in size. Right Atrium: Right atrial size was normal in size. Pericardium: There is no evidence of pericardial effusion. Mitral Valve: The mitral valve is normal in structure. Trivial mitral valve regurgitation. MV peak gradient, 2.3 mmHg. The mean mitral valve gradient is 1.0 mmHg. Tricuspid Valve: The tricuspid valve is normal in structure. Tricuspid valve regurgitation is trivial. Aortic Valve: The aortic valve is normal in structure. Aortic valve regurgitation is not visualized. Aortic valve mean gradient measures 3.0 mmHg. Aortic valve peak gradient measures 5.1 mmHg. Aortic valve area, by VTI measures 2.72 cm. Pulmonic Valve: The pulmonic valve was normal in structure. Pulmonic valve regurgitation is not visualized. Aorta: The aortic root and ascending aorta are structurally normal, with no evidence of dilitation. IAS/Shunts: No atrial level shunt detected by color flow Doppler.  LEFT VENTRICLE PLAX 2D LVIDd:         3.80 cm   Diastology LVIDs:         2.50 cm   LV e' medial:    7.18 cm/s LV PW:         1.20 cm   LV E/e' medial:  8.6 LV IVS:        0.65 cm  LV e' lateral:   13.10 cm/s LVOT diam:     2.00 cm   LV E/e' lateral: 4.7 LV SV:         62 LV SV Index:   41 LVOT Area:     3.14 cm  RIGHT VENTRICLE RV Basal diam:  2.90 cm RV S prime:     13.70 cm/s TAPSE (M-mode): 2.9 cm LEFT ATRIUM             Index        RIGHT ATRIUM           Index LA diam:        2.90 cm 1.91 cm/m   RA Area:     16.80 cm LA Vol (A2C):   36.6 ml 24.11 ml/m  RA Volume:   45.20 ml  29.78 ml/m LA Vol (A4C):   23.0 ml 15.15 ml/m LA Biplane Vol: 28.9 ml 19.04 ml/m  AORTIC VALVE                    PULMONIC VALVE AV Area (Vmax):    3.00 cm     PV Vmax:        0.64 m/s AV Area (Vmean):   2.61 cm     PV Vmean:        43.950 cm/s AV Area (VTI):     2.72 cm     PV VTI:         0.126 m AV Vmax:           113.00 cm/s  PV Peak grad:   1.6 mmHg AV Vmean:          82.200 cm/s  PV Mean grad:   1.0 mmHg AV VTI:            0.229 m      RVOT Peak grad: 3 mmHg AV Peak Grad:      5.1 mmHg AV Mean Grad:      3.0 mmHg LVOT Vmax:         108.00 cm/s LVOT Vmean:        68.400 cm/s LVOT VTI:          0.198 m LVOT/AV VTI ratio: 0.86  AORTA Ao Root diam: 2.90 cm MITRAL VALVE MV Area (PHT): 3.87 cm    SHUNTS MV Area VTI:   3.81 cm    Systemic VTI:  0.20 m MV Peak grad:  2.3 mmHg    Systemic Diam: 2.00 cm MV Mean grad:  1.0 mmHg    Pulmonic VTI:  0.160 m MV Vmax:       0.76 m/s MV Vmean:      52.5 cm/s MV Decel Time: 196 msec MV E velocity: 61.70 cm/s MV A velocity: 55.30 cm/s MV E/A ratio:  1.12 Serafina Royals MD Electronically signed by Serafina Royals MD Signature Date/Time: 02/25/2021/11:47:26 AM    Final    MM Outside Films Mammo  Result Date: 03/17/2021 This examination belongs to an outside facility and is stored here for comparison purposes only.  Contact the originating outside institution for any associated report or interpretation.  MM Outside Films Mammo  Result Date: 03/17/2021 This examination belongs to an outside facility and is stored here for comparison purposes only.  Contact the originating outside institution for any associated report or interpretation.  MM Outside Films Mammo  Result Date: 03/17/2021 This examination belongs to an outside facility and is stored here for comparison purposes only.  Contact the  originating outside institution for any associated report or interpretation.  MM Outside Films Mammo  Result Date: 03/17/2021 This examination belongs to an outside facility and is stored here for comparison purposes only.  Contact the originating outside institution for any associated report or interpretation.  MM Outside Films Mammo  Result Date: 03/17/2021 This examination belongs to an outside  facility and is stored here for comparison purposes only.  Contact the originating outside institution for any associated report or interpretation.     ASSESSMENT & PLAN:  1. Invasive ductal carcinoma of right breast ([place])   2. Neoplasm related pain   3. AKI (acute kidney injury) ([place])   4. Hypokalemia   5. Hypomagnesemia    Cancer Staging  Invasive ductal carcinoma of right breast [facility]) Staging form: Breast, AJCC 8th Edition - Clinical stage from 02/19/2021: Stage IIIB (cT3, cN0, cM0, G2, ER-, PR-, HER2-) - Signed by Earlie Server, MD on 02/20/2021  #Clinical stage III triple negative right breast cancer. cT3 cN0-1 Baseline elevated CA 15-3 and CA 27.29. CT scan was independently reviewed by me and discussed with patient. It has been 4+ months since her diagnosis of triple negative breast cancer.I am concerned that her disease has progressed I will repeat diagnostic mammogram of the right breast with ultrasound.  Right axillary lymphadenopathy, plan to biopsy after imaging.  S/p cycle 1 D1 Keytrua/carboplatin/Taxol.  Hold D8 Taxol due to AKI.   # AKI due to decreased oral intake.  # Hyponatremia due to poor oral intake.  IVF 1 L NS x 1 today  # Nausea, without vomiting IV dexamethasone 38m x 1 today.  Continue home antiemetics  # Hypokalemia, potassium is 2.9 IV KCL 276m x 1 today Recommend her to take potassium chloride 2080mBID. Rx sent to pharmacy.   # Hypomagnesia, IV mag sulfate 2g x 1.   # Mucositis, magic mouth wash.  # weight loss, decreased oral intake. Refer to dietitian patient   #Generalized body ache, right breast pain, obtain bone scan. Continue gabapentin.  Continue follow-up with palliative care service.   Orders Placed This Encounter  Procedures   Magnesium    Standing Status:   Future    Number of Occurrences:   1    Standing Expiration Date:   1/59/4/8016Basic metabolic panel    Standing Status:   Future    Standing Expiration Date:   03/18/2022    Magnesium    Standing Status:   Future    Standing Expiration Date:   03/18/2022   Magnesium    Standing Status:   Standing    Number of Occurrences:   15    Standing Expiration Date:   03/18/2022   Ambulatory referral to Social Work    Referral Priority:   Routine    Referral Type:   Consultation    Referral Reason:   Specialty Services Required    Number of Visits Requested:   1   [facility]    Referral Priority:   Routine    Referral Type:   Consultation    Referral Reason:   Specialty Services Required    Number of Visits Requested:   1    All questions were answered. The patient knows to call the clinic with any problems questions or concerns.  cc BosEvern Bio NP    Return of visit:  repeat BMP and Magnesium in 2 days +/- IVF, IV portassium and magnesium  Follow  up  in 1 week for lab md Taxol   Earlie Server, MD, PhD  03/18/2021

## 2021-03-18 NOTE — Addendum Note (Signed)
Addended by: Evelina Dun on: 03/18/2021 01:23 PM   Modules accepted: Orders

## 2021-03-19 ENCOUNTER — Other Ambulatory Visit: Payer: Self-pay

## 2021-03-19 ENCOUNTER — Inpatient Hospital Stay: Payer: Medicaid Other

## 2021-03-19 ENCOUNTER — Encounter: Payer: Self-pay | Admitting: Oncology

## 2021-03-19 VITALS — BP 108/71 | HR 74 | Temp 98.7°F | Resp 16

## 2021-03-19 DIAGNOSIS — C50911 Malignant neoplasm of unspecified site of right female breast: Secondary | ICD-10-CM

## 2021-03-19 DIAGNOSIS — Z5112 Encounter for antineoplastic immunotherapy: Secondary | ICD-10-CM | POA: Diagnosis not present

## 2021-03-19 LAB — BASIC METABOLIC PANEL
Anion gap: 8 (ref 5–15)
BUN: 26 mg/dL — ABNORMAL HIGH (ref 8–23)
CO2: 22 mmol/L (ref 22–32)
Calcium: 8 mg/dL — ABNORMAL LOW (ref 8.9–10.3)
Chloride: 102 mmol/L (ref 98–111)
Creatinine, Ser: 1.23 mg/dL — ABNORMAL HIGH (ref 0.44–1.00)
GFR, Estimated: 50 mL/min — ABNORMAL LOW (ref >60)
Glucose, Bld: 91 mg/dL (ref 70–99)
Potassium: 3.5 mmol/L (ref 3.5–5.1)
Sodium: 132 mmol/L — ABNORMAL LOW (ref 135–145)

## 2021-03-19 LAB — MAGNESIUM: Magnesium: 1.9 mg/dL (ref 1.7–2.4)

## 2021-03-19 MED ORDER — SODIUM CHLORIDE 0.9% FLUSH
10.0000 mL | Freq: Once | INTRAVENOUS | Status: AC
  Administered 2021-03-19: 10 mL via INTRAVENOUS
  Filled 2021-03-19: qty 10

## 2021-03-19 MED ORDER — STERILE WATER FOR INJECTION IJ SOLN
5.0000 mL | Freq: Four times a day (QID) | OROMUCOSAL | 1 refills | Status: DC | PRN
  Filled 2021-03-19: qty 480, 24d supply, fill #0
  Filled 2021-07-20: qty 480, 24d supply, fill #1

## 2021-03-19 MED ORDER — SODIUM CHLORIDE 0.9 % IV SOLN
INTRAVENOUS | Status: AC
  Filled 2021-03-19: qty 250

## 2021-03-19 MED ORDER — HEPARIN SOD (PORK) LOCK FLUSH 100 UNIT/ML IV SOLN
500.0000 [IU] | Freq: Once | INTRAVENOUS | Status: AC
  Administered 2021-03-19: 500 [IU] via INTRAVENOUS
  Filled 2021-03-19: qty 5

## 2021-03-19 NOTE — Progress Notes (Signed)
Pt reports feeling much better today as compared to yesterday. Na and Mg both WNL at recheck today. 1L NS given.

## 2021-03-23 ENCOUNTER — Encounter: Payer: Self-pay | Admitting: *Deleted

## 2021-03-23 ENCOUNTER — Inpatient Hospital Stay: Payer: Medicaid Other

## 2021-03-23 ENCOUNTER — Inpatient Hospital Stay (HOSPITAL_BASED_OUTPATIENT_CLINIC_OR_DEPARTMENT_OTHER): Payer: Medicaid Other | Admitting: Oncology

## 2021-03-23 ENCOUNTER — Other Ambulatory Visit: Payer: Self-pay

## 2021-03-23 ENCOUNTER — Encounter: Payer: Self-pay | Admitting: Oncology

## 2021-03-23 VITALS — BP 126/82 | HR 78 | Temp 97.8°F | Resp 18 | Wt 109.5 lb

## 2021-03-23 DIAGNOSIS — C50911 Malignant neoplasm of unspecified site of right female breast: Secondary | ICD-10-CM

## 2021-03-23 DIAGNOSIS — Z5112 Encounter for antineoplastic immunotherapy: Secondary | ICD-10-CM | POA: Diagnosis not present

## 2021-03-23 DIAGNOSIS — G893 Neoplasm related pain (acute) (chronic): Secondary | ICD-10-CM | POA: Diagnosis not present

## 2021-03-23 DIAGNOSIS — Z5111 Encounter for antineoplastic chemotherapy: Secondary | ICD-10-CM

## 2021-03-23 DIAGNOSIS — H9213 Otorrhea, bilateral: Secondary | ICD-10-CM

## 2021-03-23 DIAGNOSIS — J019 Acute sinusitis, unspecified: Secondary | ICD-10-CM

## 2021-03-23 DIAGNOSIS — R52 Pain, unspecified: Secondary | ICD-10-CM

## 2021-03-23 DIAGNOSIS — E876 Hypokalemia: Secondary | ICD-10-CM

## 2021-03-23 LAB — CBC WITH DIFFERENTIAL/PLATELET
Abs Immature Granulocytes: 0.01 10*3/uL (ref 0.00–0.07)
Basophils Absolute: 0 10*3/uL (ref 0.0–0.1)
Basophils Relative: 1 %
Eosinophils Absolute: 0.1 10*3/uL (ref 0.0–0.5)
Eosinophils Relative: 2 %
HCT: 31.6 % — ABNORMAL LOW (ref 36.0–46.0)
Hemoglobin: 10.6 g/dL — ABNORMAL LOW (ref 12.0–15.0)
Immature Granulocytes: 0 %
Lymphocytes Relative: 68 %
Lymphs Abs: 4.3 10*3/uL — ABNORMAL HIGH (ref 0.7–4.0)
MCH: 30.7 pg (ref 26.0–34.0)
MCHC: 33.5 g/dL (ref 30.0–36.0)
MCV: 91.6 fL (ref 80.0–100.0)
Monocytes Absolute: 0.5 10*3/uL (ref 0.1–1.0)
Monocytes Relative: 8 %
Neutro Abs: 1.4 10*3/uL — ABNORMAL LOW (ref 1.7–7.7)
Neutrophils Relative %: 21 %
Platelets: 187 10*3/uL (ref 150–400)
RBC: 3.45 MIL/uL — ABNORMAL LOW (ref 3.87–5.11)
RDW: 13.2 % (ref 11.5–15.5)
WBC: 6.4 10*3/uL (ref 4.0–10.5)
nRBC: 0 % (ref 0.0–0.2)

## 2021-03-23 LAB — COMPREHENSIVE METABOLIC PANEL
ALT: 12 U/L (ref 0–44)
AST: 14 U/L — ABNORMAL LOW (ref 15–41)
Albumin: 3.4 g/dL — ABNORMAL LOW (ref 3.5–5.0)
Alkaline Phosphatase: 47 U/L (ref 38–126)
Anion gap: 5 (ref 5–15)
BUN: 12 mg/dL (ref 8–23)
CO2: 23 mmol/L (ref 22–32)
Calcium: 8.1 mg/dL — ABNORMAL LOW (ref 8.9–10.3)
Chloride: 106 mmol/L (ref 98–111)
Creatinine, Ser: 1 mg/dL (ref 0.44–1.00)
GFR, Estimated: >60 mL/min (ref >60)
Glucose, Bld: 83 mg/dL (ref 70–99)
Potassium: 3.7 mmol/L (ref 3.5–5.1)
Sodium: 134 mmol/L — ABNORMAL LOW (ref 135–145)
Total Bilirubin: 0.3 mg/dL (ref 0.3–1.2)
Total Protein: 5.9 g/dL — ABNORMAL LOW (ref 6.5–8.1)

## 2021-03-23 LAB — MAGNESIUM: Magnesium: 1.4 mg/dL — ABNORMAL LOW (ref 1.7–2.4)

## 2021-03-23 MED ORDER — DIPHENHYDRAMINE HCL 50 MG/ML IJ SOLN
50.0000 mg | Freq: Once | INTRAMUSCULAR | Status: AC
  Administered 2021-03-23: 50 mg via INTRAVENOUS
  Filled 2021-03-23: qty 1

## 2021-03-23 MED ORDER — HEPARIN SOD (PORK) LOCK FLUSH 100 UNIT/ML IV SOLN
500.0000 [IU] | Freq: Once | INTRAVENOUS | Status: AC | PRN
  Filled 2021-03-23: qty 5

## 2021-03-23 MED ORDER — MAGNESIUM CHLORIDE 64 MG PO TBEC: 1.0000 | DELAYED_RELEASE_TABLET | Freq: Every day | ORAL | 0 refills | Status: DC

## 2021-03-23 MED ORDER — SODIUM CHLORIDE 0.9 % IV SOLN
Freq: Once | INTRAVENOUS | Status: AC
  Filled 2021-03-23: qty 250

## 2021-03-23 MED ORDER — OYSTER SHELL CALCIUM/D3 500-5 MG-MCG PO TABS: 2.0000 | ORAL_TABLET | Freq: Every day | ORAL | 5 refills | Status: DC

## 2021-03-23 MED ORDER — SODIUM CHLORIDE 0.9% FLUSH
10.0000 mL | INTRAVENOUS | Status: DC | PRN
  Filled 2021-03-23: qty 10

## 2021-03-23 MED ORDER — SODIUM CHLORIDE 0.9 % IV SOLN
80.0000 mg/m2 | Freq: Once | INTRAVENOUS | Status: AC
  Administered 2021-03-23: 120 mg via INTRAVENOUS
  Filled 2021-03-23: qty 20

## 2021-03-23 MED ORDER — SODIUM CHLORIDE 0.9 % IV SOLN
10.0000 mg | Freq: Once | INTRAVENOUS | Status: AC
  Administered 2021-03-23: 10 mg via INTRAVENOUS
  Filled 2021-03-23: qty 10

## 2021-03-23 MED ORDER — HEPARIN SOD (PORK) LOCK FLUSH 100 UNIT/ML IV SOLN
INTRAVENOUS | Status: AC
  Administered 2021-03-23: 500 [IU]
  Filled 2021-03-23: qty 5

## 2021-03-23 MED ORDER — AMOXICILLIN-POT CLAVULANATE 875-125 MG PO TABS: 1.0000 | ORAL_TABLET | Freq: Two times a day (BID) | ORAL | 0 refills | Status: DC

## 2021-03-23 MED ORDER — FAMOTIDINE IN NACL 20-0.9 MG/50ML-% IV SOLN
20.0000 mg | Freq: Once | INTRAVENOUS | Status: AC
  Administered 2021-03-23: 20 mg via INTRAVENOUS
  Filled 2021-03-23: qty 50

## 2021-03-23 MED ORDER — MAGNESIUM SULFATE 2 GM/50ML IV SOLN
2.0000 g | Freq: Once | INTRAVENOUS | Status: AC
  Administered 2021-03-23: 2 g via INTRAVENOUS
  Filled 2021-03-23: qty 50

## 2021-03-23 NOTE — Patient Instructions (Signed)
[facility] [place]  Discharge Instructions: Thank you for choosing [facility] to provide your oncology and hematology care.  If you have a lab appointment with the [facility], please go directly to the [facility] and check in at the registration area.  Wear comfortable clothing and clothing appropriate for easy access to any Portacath or PICC line.   We strive to give you quality time with your provider. You may need to reschedule your appointment if you arrive late (15 or more minutes).  Arriving late affects you and other patients whose appointments are after yours.  Also, if you miss three or more appointments without notifying the office, you may be dismissed from the clinic at the providers discretion.      For prescription refill requests, have your pharmacy contact our office and allow 72 hours for refills to be completed.    Today you received the following chemotherapy and/or immunotherapy agents - paclitaxel      To help prevent nausea and vomiting after your treatment, we encourage you to take your nausea medication as directed.  BELOW ARE SYMPTOMS THAT SHOULD BE REPORTED IMMEDIATELY: *FEVER GREATER THAN 100.4 F (38 C) OR HIGHER *CHILLS OR SWEATING *NAUSEA AND VOMITING THAT IS NOT CONTROLLED WITH YOUR NAUSEA MEDICATION *UNUSUAL SHORTNESS OF BREATH *UNUSUAL BRUISING OR BLEEDING *URINARY PROBLEMS (pain or burning when urinating, or frequent urination) *BOWEL PROBLEMS (unusual diarrhea, constipation, pain near the anus) TENDERNESS IN MOUTH AND THROAT WITH OR WITHOUT PRESENCE OF ULCERS (sore throat, sores in mouth, or a toothache) UNUSUAL RASH, SWELLING OR PAIN  UNUSUAL VAGINAL DISCHARGE OR ITCHING   Items with * indicate a potential emergency and should be followed up as soon as possible or go to the Emergency Department if any problems should occur.  Please show the CHEMOTHERAPY ALERT CARD or IMMUNOTHERAPY ALERT CARD at check-in  to the Emergency Department and triage nurse.  Should you have questions after your visit or need to cancel or reschedule your appointment, please contact [facility] [place] [facility] [place]  [phone] and follow the prompts.  Office hours are 8:00 a.m. to 4:30 p.m. Monday - Friday. Please note that voicemails left after 4:00 p.m. may not be returned until the following business day.  We are closed weekends and major holidays. You have access to a nurse at all times for urgent questions. Please call the main number to the clinic [phone] and follow the prompts.  For any non-urgent questions, you may also contact your provider using MyChart. We now offer e-Visits for anyone 28 and older to request care online for non-urgent symptoms. For details visit [email or web address].   Also download the MyChart app! Go to the app store, search "MyChart", open the app, select [facility], and log in with your MyChart username and password.  Due to Covid, a mask is required upon entering the hospital/clinic. If you do not have a mask, one will be given to you upon arrival. For doctor visits, patients may have 1 support person aged 66 or older with them. For treatment visits, patients cannot have anyone with them due to current Covid guidelines and our immunocompromised population.   Paclitaxel injection What is this medication? PACLITAXEL (PAK li TAX el) is a chemotherapy drug. It targets fast dividing cells, like cancer cells, and causes these cells to die. This medicine is used to treat ovarian cancer, breast cancer, lung cancer, Kaposi's sarcoma, and other cancers. This medicine may be used for other purposes;  ask your health care provider or pharmacist if you have questions. COMMON BRAND NAME(S): Onxol, Taxol What should I tell my care team before I take this medication? They need to know if you have any of these conditions: history of irregular heartbeat liver disease low blood counts,  like low white cell, platelet, or red cell counts lung or breathing disease, like asthma tingling of the fingers or toes, or other nerve disorder an unusual or allergic reaction to paclitaxel, alcohol, polyoxyethylated castor oil, other chemotherapy, other medicines, foods, dyes, or preservatives pregnant or trying to get pregnant breast-feeding How should I use this medication? This drug is given as an infusion into a vein. It is administered in a hospital or clinic by a specially trained health care professional. Talk to your pediatrician regarding the use of this medicine in children. Special care may be needed. Overdosage: If you think you have taken too much of this medicine contact a poison control center or emergency room at once. NOTE: This medicine is only for you. Do not share this medicine with others. What if I miss a dose? It is important not to miss your dose. Call your doctor or health care professional if you are unable to keep an appointment. What may interact with this medication? Do not take this medicine with any of the following medications: live virus vaccines This medicine may also interact with the following medications: antiviral medicines for hepatitis, HIV or AIDS certain antibiotics like erythromycin and clarithromycin certain medicines for fungal infections like ketoconazole and itraconazole certain medicines for seizures like carbamazepine, phenobarbital, phenytoin gemfibrozil nefazodone rifampin St. John's wort This list may not describe all possible interactions. Give your health care provider a list of all the medicines, herbs, non-prescription drugs, or dietary supplements you use. Also tell them if you smoke, drink alcohol, or use illegal drugs. Some items may interact with your medicine. What should I watch for while using this medication? Your condition will be monitored carefully while you are receiving this medicine. You will need important blood work  done while you are taking this medicine. This medicine can cause serious allergic reactions. To reduce your risk you will need to take other medicine(s) before treatment with this medicine. If you experience allergic reactions like skin rash, itching or hives, swelling of the face, lips, or tongue, tell your doctor or health care professional right away. In some cases, you may be given additional medicines to help with side effects. Follow all directions for their use. This drug may make you feel generally unwell. This is not uncommon, as chemotherapy can affect healthy cells as well as cancer cells. Report any side effects. Continue your course of treatment even though you feel ill unless your doctor tells you to stop. Call your doctor or health care professional for advice if you get a fever, chills or sore throat, or other symptoms of a cold or flu. Do not treat yourself. This drug decreases your body's ability to fight infections. Try to avoid being around people who are sick. This medicine may increase your risk to bruise or bleed. Call your doctor or health care professional if you notice any unusual bleeding. Be careful brushing and flossing your teeth or using a toothpick because you may get an infection or bleed more easily. If you have any dental work done, tell your dentist you are receiving this medicine. Avoid taking products that contain aspirin, acetaminophen, ibuprofen, naproxen, or ketoprofen unless instructed by your doctor. These medicines may hide  a fever. Do not become pregnant while taking this medicine. Women should inform their doctor if they wish to become pregnant or think they might be pregnant. There is a potential for serious side effects to an unborn child. Talk to your health care professional or pharmacist for more information. Do not breast-feed an infant while taking this medicine. Men are advised not to father a child while receiving this medicine. This product may  contain alcohol. Ask your pharmacist or healthcare provider if this medicine contains alcohol. Be sure to tell all healthcare providers you are taking this medicine. Certain medicines, like metronidazole and disulfiram, can cause an unpleasant reaction when taken with alcohol. The reaction includes flushing, headache, nausea, vomiting, sweating, and increased thirst. The reaction can last from 30 minutes to several hours. What side effects may I notice from receiving this medication? Side effects that you should report to your doctor or health care professional as soon as possible: allergic reactions like skin rash, itching or hives, swelling of the face, lips, or tongue breathing problems changes in vision fast, irregular heartbeat high or low blood pressure mouth sores pain, tingling, numbness in the hands or feet signs of decreased platelets or bleeding - bruising, pinpoint red spots on the skin, black, tarry stools, blood in the urine signs of decreased red blood cells - unusually weak or tired, feeling faint or lightheaded, falls signs of infection - fever or chills, cough, sore throat, pain or difficulty passing urine signs and symptoms of liver injury like dark yellow or brown urine; general ill feeling or flu-like symptoms; light-colored stools; loss of appetite; nausea; right upper belly pain; unusually weak or tired; yellowing of the eyes or skin swelling of the ankles, feet, hands unusually slow heartbeat Side effects that usually do not require medical attention (report to your doctor or health care professional if they continue or are bothersome): diarrhea hair loss loss of appetite muscle or joint pain nausea, vomiting pain, redness, or irritation at site where injected tiredness This list may not describe all possible side effects. Call your doctor for medical advice about side effects. You may report side effects to FDA at 1-800-FDA-1088. Where should I keep my  medication? This drug is given in a hospital or clinic and will not be stored at home. NOTE: This sheet is a summary. It may not cover all possible information. If you have questions about this medicine, talk to your doctor, pharmacist, or health care provider.  2022 [employer] (2020-11-17 00:00:00)

## 2021-03-23 NOTE — Progress Notes (Signed)
Hematology/Oncology progress note [phone] [phone]      Patient Care Team: [name], NP as PCP - General (Nurse Practitioner)  REFERRING PROVIDER: [name], NP  CHIEF COMPLAINTS/REASON FOR VISIT:  Follow-up for triple negative right breast cancer.  HISTORY OF PRESENTING ILLNESS:   Yvonne Scott is a  61 y.o.  female with PMH listed below was seen in consultation at the request of  [name], NP  for evaluation of breast cancer.  August 2022, patient was diagnosed with right breast stage IIIb cT3 N0M0, grade 2, ER negative, PR negative HER2 negative breast cancer.  Patient self palpated breast mass many years ago.  10/21/2020 diagnostic mammogram and ultrasound confirmed presence of mass and a subsequent biopsy revealed right breast triple negative cancer. There was plan for patient to start with neoadjuvant chemotherapy followed by surgery and radiation.  Unfortunately patient never started treatment.  Patient was accompanied by her mother.  They want to transfer her oncology care to locally.  Patient reports having right breast pain as well as generalized pain.  Patient had a [name] placed 3 weeks ago and she reports some swelling around the sites.  Patient has had negative genetic testing at [facility].  Patient has a history of alcohol dependence.  Per patient she has not been drinking recently.  Everyday smoker.   02/24/2021, CT chest abdomen pelvis showed large right breast mass, 5.5 x 4.8 cm with small right axillary lymph node with variable degrees of enhancement potentially involved but not specific based on size.  This tracked down to retropectoral nodal stations largest lymph node along the lateral margin of the pectoralis major.  Tiny pulmonary nodules in the chest as discussed.  These are nonspecific but would consider short interval follow-up to assess for any changes.  No evidence of metastatic disease involving the abdomen or  pelvis.  Aortic atherosclerosis.  02/25/2021, echocardiogram showed LVEF 65 to 70%.  Patient has been to chemotherapy class and understands antiemetics instructions.  03/16/2021, bone scan showed no evidence of osseous metastatic breast cancer, asymmetric osseous uptake on the right at L5-S1, corresponding to degenerative disc disease on recent CT.  Asymmetric soft tissue uptake in the right breast.  INTERVAL HISTORY Yvonne Scott is a 61 y.o. female who has above history reviewed by me today presents for follow up visit for management of triple negative breast cancer, stage IIIb Patient is status post cycle 1 day 1 carboplatin/Taxol/Keytruda. Daily treatment was delayed due to AKI/nausea vomiting.  Patient has received supportive care and she has improved since then.  Right breast pain has improved.  No nausea vomiting today. Patient reports nasal drainage, yellowish discharge.  She reports a history of chronic sinusitis and this is similar to her flare symptoms.  No fever or chills.  She also complained bilateral ear fluid. No significant hearing changes  Review of Systems  Constitutional:  Positive for fatigue. Negative for appetite change, chills and fever.  HENT:   Positive for mouth sores. Negative for hearing loss and voice change.   Eyes:  Negative for eye problems.  Respiratory:  Negative for chest tightness and cough.   Cardiovascular:  Negative for chest pain.  Gastrointestinal:  Positive for nausea. Negative for abdominal distention, abdominal pain and blood in stool.  Endocrine: Negative for hot flashes.  Genitourinary:  Negative for difficulty urinating and frequency.   Musculoskeletal:  Negative for arthralgias.       Generalized body aches  Skin:  Negative for itching and rash.  Neurological:  Negative for extremity weakness.  Hematological:  Negative for adenopathy.  Psychiatric/Behavioral:  Negative for confusion.   Right breast mass with nipple discharge   Tender.  MEDICAL HISTORY:  Past Medical History:  Diagnosis Date   Anxiety    Aortic atherosclerosis ([place]) 10/24/2016   Breast cancer (HCC)    GERD (gastroesophageal reflux disease)     SURGICAL HISTORY: No past surgical history on file.  SOCIAL HISTORY: Social History   Socioeconomic History   Marital status: Widowed    Spouse name: Not on file   Number of children: Not on file   Years of education: Not on file   Highest education level: Not on file  Occupational History   Not on file  Tobacco Use   Smoking status: Every Day    Packs/day: 1.00    Years: 36.00    Pack years: 36.00    Types: Cigarettes    Start date: 03/15/1979   Smokeless tobacco: Never  Vaping Use   Vaping Use: Never used  Substance and Sexual Activity   Alcohol use: No    Alcohol/week: 3.0 standard drinks    Types: 3 Standard drinks or equivalent per week   Drug use: Not Currently   Sexual activity: Not Currently    Birth control/protection: None  Other Topics Concern   Not on file  Social History Narrative   Not on file   Social Determinants of Health   Financial Resource Strain: Not on file  Food Insecurity: Not on file  Transportation Needs: Not on file  Physical Activity: Not on file  Stress: Not on file  Social Connections: Not on file  Intimate Partner Violence: Not on file    FAMILY HISTORY: Family History  Problem Relation Age of Onset   Cancer Mother 49       breast   Cancer Father        melonma   Cirrhosis Maternal Grandfather    Cancer Paternal Grandmother        skin/breast    ALLERGIES:  has No Known Allergies.  MEDICATIONS:  Current Outpatient Medications  Medication Sig Dispense Refill   amoxicillin-clavulanate (AUGMENTIN) 875-125 MG tablet Take 1 tablet by mouth 2 (two) times daily. 10 tablet 0   calcium-vitamin D (OSCAL WITH D) 500-5 MG-MCG tablet Take 2 tablets by mouth daily. 60 tablet 5   dexamethasone (DECADRON) 4 MG tablet Take 2 tablets once a day  for 2 days after carboplatin and AC chemotherapy. Take with food. 30 tablet 1   folic acid (FOLVITE) 1 MG tablet Take 1 tablet (1 mg total) by mouth daily. 30 tablet 0   gabapentin (NEURONTIN) 100 MG capsule Take 1 capsule (100 mg total) by mouth 3 (three) times daily. 30 capsule 0   guaiFENesin (MUCINEX) 600 MG 12 hr tablet Take 2 tablets (1,200 mg total) by mouth 2 (two) times daily. 60 tablet 0   ibuprofen (ADVIL) 200 MG tablet Take 200 mg by mouth every 6 (six) hours as needed for headache.     lidocaine-prilocaine (EMLA) cream Apply to affected area once 30 g 3   magnesium chloride (SLOW-MAG) 64 MG TBEC SR tablet Take 1 tablet (64 mg total) by mouth daily. 30 tablet 0   Multiple Vitamin (MULTIVITAMIN WITH MINERALS) TABS tablet Take 1 tablet by mouth daily. 30 tablet 0   omeprazole (PRILOSEC) 20 MG capsule Take 20 mg by mouth daily as needed (reflux).  potassium chloride SA (KLOR-CON M) 20 MEQ tablet Take 1 tablet (20 mEq total) by mouth 2 (two) times daily. 60 tablet 0   thiamine 100 MG tablet Take 0.5 tablets (50 mg total) by mouth daily. 15 tablet 0   magic mouthwash (multi-ingredient) oral suspension Take 5 mLs by mouth 4 (four) times daily as needed for mouth pain. (Patient not taking: Reported on 03/23/2021) 480 mL 1   ondansetron (ZOFRAN) 8 MG tablet Take 1 tablet (8 mg total) by mouth 2 (two) times daily as needed. Start on the third day after carboplatin and AC chemotherapy. (Patient not taking: Reported on 03/23/2021) 30 tablet 1   prochlorperazine (COMPAZINE) 10 MG tablet Take 1 tablet (10 mg total) by mouth every 6 (six) hours as needed (Nausea or vomiting). (Patient not taking: Reported on 03/23/2021) 30 tablet 1   No current facility-administered medications for this visit.   Facility-Administered Medications Ordered in Other Visits  Medication Dose Route Frequency Provider Last Rate Last Admin   sodium chloride flush (NS) 0.9 % injection 10 mL  10 mL Intracatheter PRN Earlie Server,  MD         PHYSICAL EXAMINATION: ECOG PERFORMANCE STATUS: 1 - Symptomatic but completely ambulatory Vitals:   03/23/21 0845  BP: 126/82  Pulse: 78  Resp: 18  Temp: 97.8 F (36.6 C)   Filed Weights   03/23/21 0845  Weight: 109 lb 8 oz (49.7 kg)    Physical Exam Constitutional:      General: She is not in acute distress. HENT:     Head: Normocephalic and atraumatic.     Mouth/Throat:     Comments: mucositis Eyes:     General: No scleral icterus. Cardiovascular:     Rate and Rhythm: Normal rate and regular rhythm.     Heart sounds: Normal heart sounds.  Pulmonary:     Effort: Pulmonary effort is normal. No respiratory distress.     Breath sounds: No wheezing.  Abdominal:     General: Bowel sounds are normal. There is no distension.     Palpations: Abdomen is soft.  Musculoskeletal:        General: No deformity. Normal range of motion.     Cervical back: Normal range of motion and neck supple.  Skin:    General: Skin is warm and dry.     Findings: No erythema or rash.  Neurological:     Mental Status: She is alert and oriented to person, place, and time. Mental status is at baseline.     Cranial Nerves: No cranial nerve deficit.     Coordination: Coordination normal.  Psychiatric:     Comments: anxious    LABORATORY DATA:  I have reviewed the data as listed Lab Results  Component Value Date   WBC 6.4 03/23/2021   HGB 10.6 (L) 03/23/2021   HCT 31.6 (L) 03/23/2021   MCV 91.6 03/23/2021   PLT 187 03/23/2021   Recent Labs    03/11/21 0845 03/18/21 0836 03/19/21 0905 03/23/21 0810  NA 136 132* 132* 134*  K 3.5 2.9* 3.5 3.7  CL 104 95* 102 106  CO2 21* '22 22 23  ' GLUCOSE 98 99 91 83  BUN 11 19 26* 12  CREATININE 0.54 1.23* 1.23* 1.00  CALCIUM 8.9 8.2* 8.0* 8.1*  GFRNONAA >60 50* 50* >60  PROT 6.9 6.8  --  5.9*  ALBUMIN 3.8 3.8  --  3.4*  AST 16 19  --  14*  ALT 9 13  --  12  ALKPHOS 63 58  --  47  BILITOT 0.4 0.8  --  0.3    Iron/TIBC/Ferritin/  %Sat    Component Value Date/Time   IRON 80 10/24/2016 1534   TIBC 282 10/24/2016 1534   FERRITIN 282 (H) 11/17/2016 1404   IRONPCTSAT 28 10/24/2016 1534       RADIOGRAPHIC STUDIES: I have personally reviewed the radiological images as listed and agreed with the findings in the report. NM Bone Scan Whole Body  Result Date: 03/16/2021 CLINICAL DATA:  Invasive triple negative ductal carcinoma the right breast. Right breast pain radiating into the arm. EXAM: NUCLEAR MEDICINE WHOLE BODY BONE SCAN TECHNIQUE: Whole body anterior and posterior images were obtained approximately 3 hours after intravenous injection of radiopharmaceutical. RADIOPHARMACEUTICALS:  20.46 mCi Technetium-35mMDP IV COMPARISON:  CTs of the chest, abdomen and pelvis 02/24/2021. No previous bone scan. FINDINGS: There is no osseous uptake highly suspicious for metastatic disease. There is focal increased uptake at L5-S1 on the right, corresponding with degenerative disc disease and endplate sclerosis on previous CT. Asymmetric right mandibular activity is likely periodontal. Asymmetric soft tissue activity is present within the right breast, corresponding with the known right breast mass and/or subsequent therapy. Soft tissue activity otherwise appears unremarkable. IMPRESSION: 1. No evidence of osseous metastatic breast cancer. 2. Asymmetric osseous uptake on the right at L5-S1, corresponding with degenerative disc disease on recent CT. 3. Asymmetric soft tissue uptake in the right breast. Electronically Signed   By: WRichardean SaleM.D.   On: 03/16/2021 12:36   CT CHEST ABDOMEN PELVIS W CONTRAST  Result Date: 02/24/2021 CLINICAL DATA:  Triple negative breast cancer in a 61year old fat select fat female, recent diagnosis. EXAM: CT CHEST, ABDOMEN, AND PELVIS WITH CONTRAST TECHNIQUE: Multidetector CT imaging of the chest, abdomen and pelvis was performed following the standard protocol during bolus administration of intravenous  contrast. CONTRAST:  876mOMNIPAQUE IOHEXOL 300 MG/ML  SOLN COMPARISON:  October 16, 2018. FINDINGS: CT CHEST FINDINGS Cardiovascular: Calcified atheromatous plaque and noncalcified plaque in the thoracic aorta. No aneurysm. Normal heart size. No substantial pericardial effusion. Normal caliber of the central pulmonary vasculature. LEFT-sided Port-A-Cath terminates at the caval to atrial junction. Mediastinum/Nodes: No internal mammary adenopathy. No mediastinal adenopathy.  Esophagus grossly normal. Small RIGHT axillary lymph nodes measuring between 6 and 7 mm are nonspecific, seen in the context of an irregular peripherally enhancing mass in the RIGHT breast measuring 5.5 x 4.8 cm. Lungs/Pleura: Tiny RIGHT basilar pulmonary nodule just above the RIGHT hemidiaphragm (image 133/4) 3 mm. Tiny RIGHT lower lobe nodule (image 94/4) potentially with calcification at 1-2 mm. Small RIGHT upper lobe nodule (image 40/4) 3 mm. Mild basilar atelectasis. No effusion. No consolidation. Airways are patent. Musculoskeletal: See below for full musculoskeletal details. RIGHT breast mass as discussed above. CT ABDOMEN PELVIS FINDINGS Hepatobiliary: No focal, suspicious hepatic lesion. No pericholecystic stranding. No biliary duct dilation. Portal vein is patent. Pancreas: Normal, without mass, inflammation or ductal dilatation. Spleen: Spleen normal size and contour without focal lesion. Adrenals/Urinary Tract: Adrenal glands are unremarkable. Symmetric renal enhancement. No sign of hydronephrosis. No suspicious renal lesion or perinephric stranding. Urinary bladder is grossly unremarkable. Stomach/Bowel: No acute gastrointestinal finding.  Normal appendix. Vascular/Lymphatic: Aortic atherosclerosis. No sign of aneurysm. Smooth contour of the IVC. There is no gastrohepatic or hepatoduodenal ligament lymphadenopathy. No retroperitoneal or mesenteric lymphadenopathy. No pelvic sidewall lymphadenopathy. Atherosclerotic changes are  moderate to marked with calcified and noncalcified plaque in the abdominal aorta. Reproductive: Unremarkable  by CT.  No adnexal mass. Other: No ascites.  No free air. Musculoskeletal: No acute bone finding. No destructive bone process. Spinal degenerative changes. IMPRESSION: Large RIGHT breast mass with small RIGHT axillary lymph nodes with variable degrees of enhancement potentially involved but nonspecific based on size. These track towards retropectoral nodal stations largest lymph node along the lateral margin of the pectoralis major. Tiny pulmonary nodules in the chest as discussed. These are nonspecific but would consider short interval follow-up to assess for any changes. No evidence of metastatic disease involving the abdomen or pelvis. Aortic atherosclerosis. Aortic Atherosclerosis (ICD10-I70.0). Electronically Signed   By: Zetta Bills M.D.   On: 02/24/2021 10:47   ECHOCARDIOGRAM COMPLETE  Result Date: 02/25/2021    ECHOCARDIOGRAM REPORT   Patient Name:   Yvonne Scott Date of Exam: 02/25/2021 Medical Rec #:  [id number]        Height:       64.0 in Accession #:    [id number]       Weight:       110.1 lb Date of Birth:  [birth year]       BSA:          1.518 m Patient Age:    43 years         BP:           123/79 mmHg Patient Gender: F                HR:           96 bpm. Exam Location:  [facility] Procedure: 2D Echo, Color Doppler and Cardiac Doppler Indications:     Chemo Z09  History:         Patient has no prior history of Echocardiogram examinations.                  Anxiety, GERD.  Sonographer:     Sherrie Sport Referring Phys:  [id number] [place] Diagnosing Phys: Serafina Royals MD  Sonographer Comments: Suboptimal apical window. IMPRESSIONS  1. Left ventricular ejection fraction, by estimation, is 65 to 70%. The left ventricle has normal function. The left ventricle has no regional wall motion abnormalities. Left ventricular diastolic parameters were normal.  2. Right ventricular systolic function is  normal. The right ventricular size is normal.  3. The mitral valve is normal in structure. Trivial mitral valve regurgitation.  4. The aortic valve is normal in structure. Aortic valve regurgitation is not visualized. FINDINGS  Left Ventricle: Left ventricular ejection fraction, by estimation, is 65 to 70%. The left ventricle has normal function. The left ventricle has no regional wall motion abnormalities. The left ventricular internal cavity size was normal in size. There is  no left ventricular hypertrophy. Left ventricular diastolic parameters were normal. Right Ventricle: The right ventricular size is normal. No increase in right ventricular wall thickness. Right ventricular systolic function is normal. Left Atrium: Left atrial size was normal in size. Right Atrium: Right atrial size was normal in size. Pericardium: There is no evidence of pericardial effusion. Mitral Valve: The mitral valve is normal in structure. Trivial mitral valve regurgitation. MV peak gradient, 2.3 mmHg. The mean mitral valve gradient is 1.0 mmHg. Tricuspid Valve: The tricuspid valve is normal in structure. Tricuspid valve regurgitation is trivial. Aortic Valve: The aortic valve is normal in structure. Aortic valve regurgitation is not visualized. Aortic valve mean gradient measures 3.0 mmHg. Aortic valve peak gradient measures 5.1 mmHg. Aortic valve area, by VTI measures 2.72 cm.  Pulmonic Valve: The pulmonic valve was normal in structure. Pulmonic valve regurgitation is not visualized. Aorta: The aortic root and ascending aorta are structurally normal, with no evidence of dilitation. IAS/Shunts: No atrial level shunt detected by color flow Doppler.  LEFT VENTRICLE PLAX 2D LVIDd:         3.80 cm   Diastology LVIDs:         2.50 cm   LV e' medial:    7.18 cm/s LV PW:         1.20 cm   LV E/e' medial:  8.6 LV IVS:        0.65 cm   LV e' lateral:   13.10 cm/s LVOT diam:     2.00 cm   LV E/e' lateral: 4.7 LV SV:         62 LV SV Index:   41  LVOT Area:     3.14 cm  RIGHT VENTRICLE RV Basal diam:  2.90 cm RV S prime:     13.70 cm/s TAPSE (M-mode): 2.9 cm LEFT ATRIUM             Index        RIGHT ATRIUM           Index LA diam:        2.90 cm 1.91 cm/m   RA Area:     16.80 cm LA Vol (A2C):   36.6 ml 24.11 ml/m  RA Volume:   45.20 ml  29.78 ml/m LA Vol (A4C):   23.0 ml 15.15 ml/m LA Biplane Vol: 28.9 ml 19.04 ml/m  AORTIC VALVE                    PULMONIC VALVE AV Area (Vmax):    3.00 cm     PV Vmax:        0.64 m/s AV Area (Vmean):   2.61 cm     PV Vmean:       43.950 cm/s AV Area (VTI):     2.72 cm     PV VTI:         0.126 m AV Vmax:           113.00 cm/s  PV Peak grad:   1.6 mmHg AV Vmean:          82.200 cm/s  PV Mean grad:   1.0 mmHg AV VTI:            0.229 m      RVOT Peak grad: 3 mmHg AV Peak Grad:      5.1 mmHg AV Mean Grad:      3.0 mmHg LVOT Vmax:         108.00 cm/s LVOT Vmean:        68.400 cm/s LVOT VTI:          0.198 m LVOT/AV VTI ratio: 0.86  AORTA Ao Root diam: 2.90 cm MITRAL VALVE MV Area (PHT): 3.87 cm    SHUNTS MV Area VTI:   3.81 cm    Systemic VTI:  0.20 m MV Peak grad:  2.3 mmHg    Systemic Diam: 2.00 cm MV Mean grad:  1.0 mmHg    Pulmonic VTI:  0.160 m MV Vmax:       0.76 m/s MV Vmean:      52.5 cm/s MV Decel Time: 196 msec MV E velocity: 61.70 cm/s MV A velocity: 55.30 cm/s MV E/A ratio:  1.12 Serafina Royals MD Electronically signed by  Serafina Royals MD Signature Date/Time: 02/25/2021/11:47:26 AM    Final    MM Outside Films Mammo  Result Date: 03/17/2021 This examination belongs to an outside facility and is stored here for comparison purposes only.  Contact the originating outside institution for any associated report or interpretation.  MM Outside Films Mammo  Result Date: 03/17/2021 This examination belongs to an outside facility and is stored here for comparison purposes only.  Contact the originating outside institution for any associated report or interpretation.  MM Outside Films Mammo  Result Date:  03/17/2021 This examination belongs to an outside facility and is stored here for comparison purposes only.  Contact the originating outside institution for any associated report or interpretation.  MM Outside Films Mammo  Result Date: 03/17/2021 This examination belongs to an outside facility and is stored here for comparison purposes only.  Contact the originating outside institution for any associated report or interpretation.  MM Outside Films Mammo  Result Date: 03/17/2021 This examination belongs to an outside facility and is stored here for comparison purposes only.  Contact the originating outside institution for any associated report or interpretation.     ASSESSMENT & PLAN:  1. Invasive ductal carcinoma of right breast ([place])   2. Hypokalemia   3. Hypomagnesemia   4. Neoplasm related pain   5. Encounter for antineoplastic chemotherapy   6. Otorrhea of both ears    Cancer Staging  Invasive ductal carcinoma of right breast [facility]) Staging form: Breast, AJCC 8th Edition - Clinical stage from 02/19/2021: Stage IIIB (cT3, cN0, cM0, G2, ER-, PR-, HER2-) - Signed by Earlie Server, MD on 02/20/2021  #Clinical stage III triple negative right breast cancer. cT3 cN0-1 Baseline elevated CA 15-3 and CA 27.29. I have ordered repeat mammogram/ultrasound for evaluation of axillary lymph node with plan of biopsy.  This has not been done and the delay was due to need of  her previous mammogram to be requested from an outside facility. Labs reviewed and discussed with patient. Proceed with day 8 Taxol today.   #AKI has resolved.  Encourage oral hydration.  # Nausea, without vomiting Continue home antiemetics.  # Hypokalemia, potassium has improved to 3.7. Continue potassium chloride 2mq BID. Rx sent to pharmacy.   # Hypomagnesia, 1.4, IV mag sulfate 2g x 1.  I recommend patient to start Slow-Mag 64 mg daily.  #Hypocalcemia, recommend patient to start calcium and vitamin D supplementation. #  Mucositis, magic mouth wash.   #Acute on chronic sinusitis, recommend patient to try Augmentin twice daily for 5 days.  Prescription sent.  #Generalized body ache, right breast pain, obtain bone scan. Continue gabapentin.  Continue follow-up with palliative care service.   Orders Placed This Encounter  Procedures   Ambulatory referral to ENT    Referral Priority:   Routine    Referral Type:   Consultation    Referral Reason:   Specialty Services Required    Requested Specialty:   Otolaryngology    Number of Visits Requested:   1    All questions were answered. The patient knows to call the clinic with any problems questions or concerns.  cc BEvern BioH, NP    Return of visit:   Follow  up in 1 week for lab md Taxol   ZEarlie Server MD, PhD  03/23/2021

## 2021-03-23 NOTE — Progress Notes (Signed)
Nutrition Assessment   Reason for Assessment:   Referral from Dr Tasia Catchings for poor appetite   ASSESSMENT:  61 year old female with triple negative breast cancer.  Past medical history of Etoh dependence not drinking currently, smoker, GERD.  Patient receiving chemotherapy.  Met with patient during infusion.  Patient reports that her appetite is much better after receiving IV fluids.  Says that she still has mouth sores and hoping to get magic mouth wash today.  Says that she has been eating much better.  Has been able to eat eggs, grits, cheese toast, cabbage, squash, ice cream, milk.  Recently had [employer] chicken and mashed potatoes.     Medications:  Folic acid, zofran, prilosec, KCL, MVI, thiamine   Labs: reviewed   Anthropometrics:   Height: 64 inches Weight: 109 lb 8 oz today Noted 138 lb on 02/25/2020 BMI: 17  103 lb on 1/5 (nausea and vomiting, mouth sores)  21% weight loss in the last year, significant   Estimated Energy Needs  Kcals: 1500-1750 Protein: 75-88 g Fluid: 1.5 L   NUTRITION DIAGNOSIS: Inadequate oral intake related to cancer related treatment side effects as evidenced by poor po intake prior to fluids and weight loss   INTERVENTION:  Discussed importance of weight maintenance during treatment Discussed strategies to help with sore mouth. Handout provided Discussed oral nutrition supplements and encouraged 350 calorie shake or higher Contact information provided   MONITORING, EVALUATION, GOAL: weight trends, intake   Next Visit: to be determined with treatments  Melvenia Favela B. Zenia Resides, [place], [place] Registered Dietitian [phone] (mobile)

## 2021-03-23 NOTE — Progress Notes (Signed)
Met patient during her chemo treatment.  No complaints or needs at this time.

## 2021-03-23 NOTE — Progress Notes (Signed)
Patient here for follow up. Pt reports she continues to have ear drainage.

## 2021-03-29 ENCOUNTER — Telehealth: Payer: Self-pay

## 2021-03-29 NOTE — Telephone Encounter (Signed)
Called [facility] to follow up on referral and confirmed that they received referral. They have left patient a VM and are waiting for return call to schedule appt.

## 2021-03-30 ENCOUNTER — Inpatient Hospital Stay: Payer: Medicaid Other

## 2021-03-30 ENCOUNTER — Inpatient Hospital Stay (HOSPITAL_BASED_OUTPATIENT_CLINIC_OR_DEPARTMENT_OTHER): Payer: Medicaid Other | Admitting: Oncology

## 2021-03-30 ENCOUNTER — Other Ambulatory Visit: Payer: Self-pay

## 2021-03-30 ENCOUNTER — Encounter: Payer: Self-pay | Admitting: Oncology

## 2021-03-30 VITALS — BP 144/83 | HR 88 | Temp 97.8°F | Wt 111.3 lb

## 2021-03-30 DIAGNOSIS — R52 Pain, unspecified: Secondary | ICD-10-CM

## 2021-03-30 DIAGNOSIS — Z5111 Encounter for antineoplastic chemotherapy: Secondary | ICD-10-CM

## 2021-03-30 DIAGNOSIS — G893 Neoplasm related pain (acute) (chronic): Secondary | ICD-10-CM

## 2021-03-30 DIAGNOSIS — C50911 Malignant neoplasm of unspecified site of right female breast: Secondary | ICD-10-CM

## 2021-03-30 DIAGNOSIS — E876 Hypokalemia: Secondary | ICD-10-CM | POA: Diagnosis not present

## 2021-03-30 DIAGNOSIS — Z5112 Encounter for antineoplastic immunotherapy: Secondary | ICD-10-CM | POA: Diagnosis not present

## 2021-03-30 LAB — CBC WITH DIFFERENTIAL/PLATELET
Abs Immature Granulocytes: 0.03 10*3/uL (ref 0.00–0.07)
Basophils Absolute: 0 10*3/uL (ref 0.0–0.1)
Basophils Relative: 1 %
Eosinophils Absolute: 0.1 10*3/uL (ref 0.0–0.5)
Eosinophils Relative: 1 %
HCT: 29.9 % — ABNORMAL LOW (ref 36.0–46.0)
Hemoglobin: 10.2 g/dL — ABNORMAL LOW (ref 12.0–15.0)
Immature Granulocytes: 1 %
Lymphocytes Relative: 52 %
Lymphs Abs: 3.3 10*3/uL (ref 0.7–4.0)
MCH: 30.9 pg (ref 26.0–34.0)
MCHC: 34.1 g/dL (ref 30.0–36.0)
MCV: 90.6 fL (ref 80.0–100.0)
Monocytes Absolute: 0.3 10*3/uL (ref 0.1–1.0)
Monocytes Relative: 4 %
Neutro Abs: 2.6 10*3/uL (ref 1.7–7.7)
Neutrophils Relative %: 41 %
Platelets: 202 10*3/uL (ref 150–400)
RBC: 3.3 MIL/uL — ABNORMAL LOW (ref 3.87–5.11)
RDW: 13.7 % (ref 11.5–15.5)
WBC: 6.3 10*3/uL (ref 4.0–10.5)
nRBC: 0 % (ref 0.0–0.2)

## 2021-03-30 LAB — MAGNESIUM: Magnesium: 1.2 mg/dL — ABNORMAL LOW (ref 1.7–2.4)

## 2021-03-30 LAB — T4, FREE: Free T4: 1 ng/dL (ref 0.61–1.12)

## 2021-03-30 LAB — COMPREHENSIVE METABOLIC PANEL
ALT: 8 U/L (ref 0–44)
AST: 14 U/L — ABNORMAL LOW (ref 15–41)
Albumin: 3.7 g/dL (ref 3.5–5.0)
Alkaline Phosphatase: 46 U/L (ref 38–126)
Anion gap: 11 (ref 5–15)
BUN: 11 mg/dL (ref 8–23)
CO2: 21 mmol/L — ABNORMAL LOW (ref 22–32)
Calcium: 8.8 mg/dL — ABNORMAL LOW (ref 8.9–10.3)
Chloride: 101 mmol/L (ref 98–111)
Creatinine, Ser: 0.79 mg/dL (ref 0.44–1.00)
GFR, Estimated: >60 mL/min (ref >60)
Glucose, Bld: 85 mg/dL (ref 70–99)
Potassium: 4.3 mmol/L (ref 3.5–5.1)
Sodium: 133 mmol/L — ABNORMAL LOW (ref 135–145)
Total Bilirubin: 0.2 mg/dL — ABNORMAL LOW (ref 0.3–1.2)
Total Protein: 6.3 g/dL — ABNORMAL LOW (ref 6.5–8.1)

## 2021-03-30 LAB — TSH: TSH: 5.371 u[IU]/mL — ABNORMAL HIGH (ref 0.350–4.500)

## 2021-03-30 MED ORDER — SODIUM CHLORIDE 0.9 % IV SOLN
Freq: Once | INTRAVENOUS | Status: AC
  Filled 2021-03-30: qty 250

## 2021-03-30 MED ORDER — FAMOTIDINE IN NACL 20-0.9 MG/50ML-% IV SOLN
20.0000 mg | Freq: Once | INTRAVENOUS | Status: AC
  Administered 2021-03-30: 20 mg via INTRAVENOUS
  Filled 2021-03-30: qty 50

## 2021-03-30 MED ORDER — DIPHENHYDRAMINE HCL 50 MG/ML IJ SOLN
50.0000 mg | Freq: Once | INTRAMUSCULAR | Status: AC
  Administered 2021-03-30: 50 mg via INTRAVENOUS
  Filled 2021-03-30: qty 1

## 2021-03-30 MED ORDER — HEPARIN SOD (PORK) LOCK FLUSH 100 UNIT/ML IV SOLN
500.0000 [IU] | Freq: Once | INTRAVENOUS | Status: AC | PRN
  Administered 2021-03-30: 500 [IU]
  Filled 2021-03-30: qty 5

## 2021-03-30 MED ORDER — MAGNESIUM SULFATE 4 GM/100ML IV SOLN
4.0000 g | Freq: Once | INTRAVENOUS | Status: AC
  Administered 2021-03-30: 4 g via INTRAVENOUS
  Filled 2021-03-30: qty 100

## 2021-03-30 MED ORDER — SODIUM CHLORIDE 0.9 % IV SOLN
80.0000 mg/m2 | Freq: Once | INTRAVENOUS | Status: AC
  Administered 2021-03-30: 120 mg via INTRAVENOUS
  Filled 2021-03-30: qty 20

## 2021-03-30 MED ORDER — SODIUM CHLORIDE 0.9 % IV SOLN
10.0000 mg | Freq: Once | INTRAVENOUS | Status: AC
  Administered 2021-03-30: 10 mg via INTRAVENOUS
  Filled 2021-03-30: qty 10

## 2021-03-30 MED ORDER — MAGNESIUM CHLORIDE 64 MG PO TBEC: 1.0000 | DELAYED_RELEASE_TABLET | Freq: Two times a day (BID) | ORAL | 2 refills | Status: DC

## 2021-03-30 NOTE — Telephone Encounter (Signed)
Spoke to [place] at [facility], she stated she has been trying to get in touch with pt regarding receiving records from [facility]. They have been unable to leave a VM. Will let pt know to call [facility] in order for them to receive records and schedule mammo and [place].

## 2021-03-30 NOTE — Patient Instructions (Signed)
[facility] AT [place]  Discharge Instructions: Thank you for choosing [facility] to provide your oncology and hematology care.  If you have a lab appointment with the [facility], please go directly to the [facility] and check in at the registration area.  Wear comfortable clothing and clothing appropriate for easy access to any Portacath or PICC line.   We strive to give you quality time with your provider. You may need to reschedule your appointment if you arrive late (15 or more minutes).  Arriving late affects you and other patients whose appointments are after yours.  Also, if you miss three or more appointments without notifying the office, you may be dismissed from the clinic at the providers discretion.      For prescription refill requests, have your pharmacy contact our office and allow 72 hours for refills to be completed.    Today you received the following chemotherapy and/or immunotherapy agents TAXOL and Magnesium      To help prevent nausea and vomiting after your treatment, we encourage you to take your nausea medication as directed.  BELOW ARE SYMPTOMS THAT SHOULD BE REPORTED IMMEDIATELY: *FEVER GREATER THAN 100.4 F (38 C) OR HIGHER *CHILLS OR SWEATING *NAUSEA AND VOMITING THAT IS NOT CONTROLLED WITH YOUR NAUSEA MEDICATION *UNUSUAL SHORTNESS OF BREATH *UNUSUAL BRUISING OR BLEEDING *URINARY PROBLEMS (pain or burning when urinating, or frequent urination) *BOWEL PROBLEMS (unusual diarrhea, constipation, pain near the anus) TENDERNESS IN MOUTH AND THROAT WITH OR WITHOUT PRESENCE OF ULCERS (sore throat, sores in mouth, or a toothache) UNUSUAL RASH, SWELLING OR PAIN  UNUSUAL VAGINAL DISCHARGE OR ITCHING   Items with * indicate a potential emergency and should be followed up as soon as possible or go to the Emergency Department if any problems should occur.  Please show the CHEMOTHERAPY ALERT CARD or IMMUNOTHERAPY ALERT CARD at  check-in to the Emergency Department and triage nurse.  Should you have questions after your visit or need to cancel or reschedule your appointment, please contact [facility] [place] [facility] [place]  [phone] and follow the prompts.  Office hours are 8:00 a.m. to 4:30 p.m. Monday - Friday. Please note that voicemails left after 4:00 p.m. may not be returned until the following business day.  We are closed weekends and major holidays. You have access to a nurse at all times for urgent questions. Please call the main number to the clinic [phone] and follow the prompts.  For any non-urgent questions, you may also contact your provider using MyChart. We now offer e-Visits for anyone 41 and older to request care online for non-urgent symptoms. For details visit [email or web address].   Also download the MyChart app! Go to the app store, search "MyChart", open the app, select [facility], and log in with your MyChart username and password.  Due to Covid, a mask is required upon entering the hospital/clinic. If you do not have a mask, one will be given to you upon arrival. For doctor visits, patients may have 1 support person aged 22 or older with them. For treatment visits, patients cannot have anyone with them due to current Covid guidelines and our immunocompromised population.   Paclitaxel injection What is this medication? PACLITAXEL (PAK li TAX el) is a chemotherapy drug. It targets fast dividing cells, like cancer cells, and causes these cells to die. This medicine is used to treat ovarian cancer, breast cancer, lung cancer, Kaposi's sarcoma, and other cancers. This medicine may be used for other  purposes; ask your health care provider or pharmacist if you have questions. COMMON BRAND NAME(S): Onxol, Taxol What should I tell my care team before I take this medication? They need to know if you have any of these conditions: history of irregular heartbeat liver disease low blood  counts, like low white cell, platelet, or red cell counts lung or breathing disease, like asthma tingling of the fingers or toes, or other nerve disorder an unusual or allergic reaction to paclitaxel, alcohol, polyoxyethylated castor oil, other chemotherapy, other medicines, foods, dyes, or preservatives pregnant or trying to get pregnant breast-feeding How should I use this medication? This drug is given as an infusion into a vein. It is administered in a hospital or clinic by a specially trained health care professional. Talk to your pediatrician regarding the use of this medicine in children. Special care may be needed. Overdosage: If you think you have taken too much of this medicine contact a poison control center or emergency room at once. NOTE: This medicine is only for you. Do not share this medicine with others. What if I miss a dose? It is important not to miss your dose. Call your doctor or health care professional if you are unable to keep an appointment. What may interact with this medication? Do not take this medicine with any of the following medications: live virus vaccines This medicine may also interact with the following medications: antiviral medicines for hepatitis, HIV or AIDS certain antibiotics like erythromycin and clarithromycin certain medicines for fungal infections like ketoconazole and itraconazole certain medicines for seizures like carbamazepine, phenobarbital, phenytoin gemfibrozil nefazodone rifampin St. John's wort This list may not describe all possible interactions. Give your health care provider a list of all the medicines, herbs, non-prescription drugs, or dietary supplements you use. Also tell them if you smoke, drink alcohol, or use illegal drugs. Some items may interact with your medicine. What should I watch for while using this medication? Your condition will be monitored carefully while you are receiving this medicine. You will need important  blood work done while you are taking this medicine. This medicine can cause serious allergic reactions. To reduce your risk you will need to take other medicine(s) before treatment with this medicine. If you experience allergic reactions like skin rash, itching or hives, swelling of the face, lips, or tongue, tell your doctor or health care professional right away. In some cases, you may be given additional medicines to help with side effects. Follow all directions for their use. This drug may make you feel generally unwell. This is not uncommon, as chemotherapy can affect healthy cells as well as cancer cells. Report any side effects. Continue your course of treatment even though you feel ill unless your doctor tells you to stop. Call your doctor or health care professional for advice if you get a fever, chills or sore throat, or other symptoms of a cold or flu. Do not treat yourself. This drug decreases your body's ability to fight infections. Try to avoid being around people who are sick. This medicine may increase your risk to bruise or bleed. Call your doctor or health care professional if you notice any unusual bleeding. Be careful brushing and flossing your teeth or using a toothpick because you may get an infection or bleed more easily. If you have any dental work done, tell your dentist you are receiving this medicine. Avoid taking products that contain aspirin, acetaminophen, ibuprofen, naproxen, or ketoprofen unless instructed by your doctor. These medicines may  hide a fever. Do not become pregnant while taking this medicine. Women should inform their doctor if they wish to become pregnant or think they might be pregnant. There is a potential for serious side effects to an unborn child. Talk to your health care professional or pharmacist for more information. Do not breast-feed an infant while taking this medicine. Men are advised not to father a child while receiving this medicine. This product  may contain alcohol. Ask your pharmacist or healthcare provider if this medicine contains alcohol. Be sure to tell all healthcare providers you are taking this medicine. Certain medicines, like metronidazole and disulfiram, can cause an unpleasant reaction when taken with alcohol. The reaction includes flushing, headache, nausea, vomiting, sweating, and increased thirst. The reaction can last from 30 minutes to several hours. What side effects may I notice from receiving this medication? Side effects that you should report to your doctor or health care professional as soon as possible: allergic reactions like skin rash, itching or hives, swelling of the face, lips, or tongue breathing problems changes in vision fast, irregular heartbeat high or low blood pressure mouth sores pain, tingling, numbness in the hands or feet signs of decreased platelets or bleeding - bruising, pinpoint red spots on the skin, black, tarry stools, blood in the urine signs of decreased red blood cells - unusually weak or tired, feeling faint or lightheaded, falls signs of infection - fever or chills, cough, sore throat, pain or difficulty passing urine signs and symptoms of liver injury like dark yellow or brown urine; general ill feeling or flu-like symptoms; light-colored stools; loss of appetite; nausea; right upper belly pain; unusually weak or tired; yellowing of the eyes or skin swelling of the ankles, feet, hands unusually slow heartbeat Side effects that usually do not require medical attention (report to your doctor or health care professional if they continue or are bothersome): diarrhea hair loss loss of appetite muscle or joint pain nausea, vomiting pain, redness, or irritation at site where injected tiredness This list may not describe all possible side effects. Call your doctor for medical advice about side effects. You may report side effects to FDA at 1-800-FDA-1088. Where should I keep my  medication? This drug is given in a hospital or clinic and will not be stored at home. NOTE: This sheet is a summary. It may not cover all possible information. If you have questions about this medicine, talk to your doctor, pharmacist, or health care provider.  2022 Elsevier/Gold Standard (2020-11-17 00:00:00)   Magnesium Sulfate Injection What is this medication? MAGNESIUM SULFATE (mag NEE zee um SUL fate) prevents and treats low levels of magnesium in your body. It may also be used to prevent and treat seizures during pregnancy in people with high blood pressure disorders, such as preeclampsia or eclampsia. Magnesium plays an important role in maintaining the health of your muscles and nervous system. This medicine may be used for other purposes; ask your health care provider or pharmacist if you have questions. What should I tell my care team before I take this medication? They need to know if you have any of these conditions: Heart disease History of irregular heart beat Kidney disease An unusual or allergic reaction to magnesium sulfate, medications, foods, dyes, or preservatives Pregnant or trying to get pregnant Breast-feeding How should I use this medication? This medication is for infusion into a vein. It is given in a hospital or clinic setting. Talk to your care team about the use of this  medication in children. While this medication may be prescribed for selected conditions, precautions do apply. Overdosage: If you think you have taken too much of this medicine contact a poison control center or emergency room at once. NOTE: This medicine is only for you. Do not share this medicine with others. What if I miss a dose? This does not apply. What may interact with this medication? Certain medications for anxiety or sleep Certain medications for seizures like phenobarbital Digoxin Medications that relax muscles for surgery Narcotic medications for pain This list may not  describe all possible interactions. Give your health care provider a list of all the medicines, herbs, non-prescription drugs, or dietary supplements you use. Also tell them if you smoke, drink alcohol, or use illegal drugs. Some items may interact with your medicine. What should I watch for while using this medication? Your condition will be monitored carefully while you are receiving this medication. You may need blood work done while you are receiving this medication. What side effects may I notice from receiving this medication? Side effects that you should report to your care team as soon as possible: Allergic reactions--skin rash, itching, hives, swelling of the face, lips, tongue, or throat High magnesium level--confusion, drowsiness, facial flushing, redness, sweating, muscle weakness, fast or irregular heartbeat, trouble breathing Low blood pressure--dizziness, feeling faint or lightheaded, blurry vision Side effects that usually do not require medical attention (report to your care team if they continue or are bothersome): Headache Nausea This list may not describe all possible side effects. Call your doctor for medical advice about side effects. You may report side effects to FDA at 1-800-FDA-1088. Where should I keep my medication? This medication is given in a hospital or clinic and will not be stored at home. NOTE: This sheet is a summary. It may not cover all possible information. If you have questions about this medicine, talk to your doctor, pharmacist, or health care provider.  2022 Elsevier/Gold Standard (2020-05-14 00:00:00)

## 2021-03-30 NOTE — Progress Notes (Signed)
Hematology/Oncology progress note [phone] [phone]      Patient Care Team: [name], NP as PCP - General (Nurse Practitioner)  REFERRING PROVIDER: [name], NP  CHIEF COMPLAINTS/REASON FOR VISIT:  Follow-up for triple negative right breast cancer.  HISTORY OF PRESENTING ILLNESS:   Yvonne Scott is a  61 y.o.  female with PMH listed below was seen in consultation at the request of  [name], NP  for evaluation of breast cancer.  August 2022, patient was diagnosed with right breast stage IIIb cT3 N0M0, grade 2, ER negative, PR negative HER2 negative breast cancer.  Patient self palpated breast mass many years ago.  10/21/2020 diagnostic mammogram and ultrasound confirmed presence of mass and a subsequent biopsy revealed right breast triple negative cancer. There was plan for patient to start with neoadjuvant chemotherapy followed by surgery and radiation.  Unfortunately patient never started treatment.  Patient was accompanied by her mother.  They want to transfer her oncology care to locally.  Patient reports having right breast pain as well as generalized pain.  Patient had a [name] placed 3 weeks ago and she reports some swelling around the sites.  Patient has had negative genetic testing at [facility]  Patient has a history of alcohol dependence.  Per patient she has not been drinking recently.  Everyday smoker.   02/24/2021, CT chest abdomen pelvis showed large right breast mass, 5.5 x 4.8 cm with small right axillary lymph node with variable degrees of enhancement potentially involved but not specific based on size.  This tracked down to retropectoral nodal stations largest lymph node along the lateral margin of the pectoralis major.  Tiny pulmonary nodules in the chest as discussed.  These are nonspecific but would consider short interval follow-up to assess for any changes.  No evidence of metastatic disease involving the abdomen or  pelvis.  Aortic atherosclerosis.  02/25/2021, echocardiogram showed LVEF 65 to 70%.  Patient has been to chemotherapy class and understands antiemetics instructions.  03/16/2021, bone scan showed no evidence of osseous metastatic breast cancer, asymmetric osseous uptake on the right at L5-S1, corresponding to degenerative disc disease on recent CT.  Asymmetric soft tissue uptake in the right breast.  INTERVAL HISTORY Yvonne Scott is a 61 y.o. female who has above history reviewed by me today presents for follow up visit for management of triple negative breast cancer, stage IIIb Patient is neoadjuvant chemotherapy with carboplatin/Taxol/Keytruda. So far she tolerates treatment. She has taken Augmentin for presumed sinusitis.  She reports improvement of her sinus pressure.  She has an appointment with ENT for further evaluation. Denies nausea vomiting diarrhea after last treatments.  Right breast pain has improved. She has not had her mammogram/ultrasound scheduled yet.   Review of Systems  Constitutional:  Positive for fatigue. Negative for appetite change, chills and fever.  HENT:   Positive for mouth sores. Negative for hearing loss and voice change.   Eyes:  Negative for eye problems.  Respiratory:  Negative for chest tightness and cough.   Cardiovascular:  Negative for chest pain.  Gastrointestinal:  Negative for abdominal distention, abdominal pain, blood in stool and nausea.  Endocrine: Negative for hot flashes.  Genitourinary:  Negative for difficulty urinating and frequency.   Musculoskeletal:  Negative for arthralgias.       Generalized body aches  Skin:  Negative for itching and rash.  Neurological:  Negative for extremity weakness.  Hematological:  Negative for adenopathy.  Psychiatric/Behavioral:  Negative  for confusion.   Right breast mass with nipple discharge  Tender.  MEDICAL HISTORY:  Past Medical History:  Diagnosis Date   Anxiety    Aortic atherosclerosis  ([place]) 10/24/2016   Breast cancer ([place])    GERD (gastroesophageal reflux disease)     SURGICAL HISTORY: History reviewed. No pertinent surgical history.  SOCIAL HISTORY: Social History   Socioeconomic History   Marital status: Widowed    Spouse name: Not on file   Number of children: Not on file   Years of education: Not on file   Highest education level: Not on file  Occupational History   Not on file  Tobacco Use   Smoking status: Every Day    Packs/day: 1.00    Years: 36.00    Pack years: 36.00    Types: Cigarettes    Start date: 03/15/1979   Smokeless tobacco: Never  Vaping Use   Vaping Use: Never used  Substance and Sexual Activity   Alcohol use: No    Alcohol/week: 3.0 standard drinks    Types: 3 Standard drinks or equivalent per week   Drug use: Not Currently   Sexual activity: Not Currently    Birth control/protection: None  Other Topics Concern   Not on file  Social History Narrative   Not on file   Social Determinants of Health   Financial Resource Strain: Not on file  Food Insecurity: Not on file  Transportation Needs: Not on file  Physical Activity: Not on file  Stress: Not on file  Social Connections: Not on file  Intimate Partner Violence: Not on file    FAMILY HISTORY: Family History  Problem Relation Age of Onset   Cancer Mother 49       breast   Cancer Father        melonma   Cirrhosis Maternal Grandfather    Cancer Paternal Grandmother        skin/breast    ALLERGIES:  has No Known Allergies.  MEDICATIONS:  Current Outpatient Medications  Medication Sig Dispense Refill   amoxicillin-clavulanate (AUGMENTIN) 875-125 MG tablet Take 1 tablet by mouth 2 (two) times daily. 10 tablet 0   calcium-vitamin D (OSCAL WITH D) 500-5 MG-MCG tablet Take 2 tablets by mouth daily. 60 tablet 5   dexamethasone (DECADRON) 4 MG tablet Take 2 tablets once a day for 2 days after carboplatin and AC chemotherapy. Take with food. 30 tablet 1   folic acid  (FOLVITE) 1 MG tablet Take 1 tablet (1 mg total) by mouth daily. 30 tablet 0   gabapentin (NEURONTIN) 100 MG capsule Take 1 capsule (100 mg total) by mouth 3 (three) times daily. 30 capsule 0   guaiFENesin (MUCINEX) 600 MG 12 hr tablet Take 2 tablets (1,200 mg total) by mouth 2 (two) times daily. 60 tablet 0   ibuprofen (ADVIL) 200 MG tablet Take 200 mg by mouth every 6 (six) hours as needed for headache.     lidocaine-prilocaine (EMLA) cream Apply to affected area once 30 g 3   Multiple Vitamin (MULTIVITAMIN WITH MINERALS) TABS tablet Take 1 tablet by mouth daily. 30 tablet 0   omeprazole (PRILOSEC) 20 MG capsule Take 20 mg by mouth daily as needed (reflux).     potassium chloride SA (KLOR-CON M) 20 MEQ tablet Take 1 tablet (20 mEq total) by mouth 2 (two) times daily. 60 tablet 0   prochlorperazine (COMPAZINE) 10 MG tablet Take 1 tablet (10 mg total) by mouth every 6 (six) hours as needed (  Nausea or vomiting). 30 tablet 1   thiamine 100 MG tablet Take 0.5 tablets (50 mg total) by mouth daily. 15 tablet 0   magic mouthwash (multi-ingredient) oral suspension Take 5 mLs by mouth 4 (four) times daily as needed for mouth pain. (Patient not taking: Reported on 03/23/2021) 480 mL 1   magnesium chloride (SLOW-MAG) 64 MG TBEC SR tablet Take 1 tablet (64 mg total) by mouth 2 (two) times daily. 60 tablet 2   ondansetron (ZOFRAN) 8 MG tablet Take 1 tablet (8 mg total) by mouth 2 (two) times daily as needed. Start on the third day after carboplatin and AC chemotherapy. (Patient not taking: Reported on 03/23/2021) 30 tablet 1   No current facility-administered medications for this visit.     PHYSICAL EXAMINATION: ECOG PERFORMANCE STATUS: 1 - Symptomatic but completely ambulatory Vitals:   03/30/21 0859  BP: (!) 144/83  Pulse: 88  Temp: 97.8 F (36.6 C)   Filed Weights   03/30/21 0859  Weight: 111 lb 4.8 oz (50.5 kg)    Physical Exam Constitutional:      General: She is not in acute  distress. HENT:     Head: Normocephalic and atraumatic.  Eyes:     General: No scleral icterus. Cardiovascular:     Rate and Rhythm: Normal rate and regular rhythm.     Heart sounds: Normal heart sounds.  Pulmonary:     Effort: Pulmonary effort is normal. No respiratory distress.     Breath sounds: No wheezing.  Abdominal:     General: Bowel sounds are normal. There is no distension.     Palpations: Abdomen is soft.  Musculoskeletal:        General: No deformity. Normal range of motion.     Cervical back: Normal range of motion and neck supple.  Skin:    General: Skin is warm and dry.     Findings: No erythema or rash.  Neurological:     Mental Status: She is alert and oriented to person, place, and time. Mental status is at baseline.     Cranial Nerves: No cranial nerve deficit.     Coordination: Coordination normal.  Psychiatric:        Mood and Affect: Mood normal.    LABORATORY DATA:  I have reviewed the data as listed Lab Results  Component Value Date   WBC 6.3 03/30/2021   HGB 10.2 (L) 03/30/2021   HCT 29.9 (L) 03/30/2021   MCV 90.6 03/30/2021   PLT 202 03/30/2021   Recent Labs    03/18/21 0836 03/19/21 0905 03/23/21 0810 03/30/21 0832  NA 132* 132* 134* 133*  K 2.9* 3.5 3.7 4.3  CL 95* 102 106 101  CO2 '22 22 23 ' 21*  GLUCOSE 99 91 83 85  BUN 19 26* 12 11  CREATININE 1.23* 1.23* 1.00 0.79  CALCIUM 8.2* 8.0* 8.1* 8.8*  GFRNONAA 50* 50* >60 >60  PROT 6.8  --  5.9* 6.3*  ALBUMIN 3.8  --  3.4* 3.7  AST 19  --  14* 14*  ALT 13  --  12 8  ALKPHOS 58  --  47 46  BILITOT 0.8  --  0.3 0.2*    Iron/TIBC/Ferritin/ %Sat    Component Value Date/Time   IRON 80 10/24/2016 1534   TIBC 282 10/24/2016 1534   FERRITIN 282 (H) 11/17/2016 1404   IRONPCTSAT 28 10/24/2016 1534       RADIOGRAPHIC STUDIES: I have personally reviewed the radiological images as  listed and agreed with the findings in the report. NM Bone Scan Whole Body  Result Date:  03/16/2021 CLINICAL DATA:  Invasive triple negative ductal carcinoma the right breast. Right breast pain radiating into the arm. EXAM: NUCLEAR MEDICINE WHOLE BODY BONE SCAN TECHNIQUE: Whole body anterior and posterior images were obtained approximately 3 hours after intravenous injection of radiopharmaceutical. RADIOPHARMACEUTICALS:  20.46 mCi Technetium-40mMDP IV COMPARISON:  CTs of the chest, abdomen and pelvis 02/24/2021. No previous bone scan. FINDINGS: There is no osseous uptake highly suspicious for metastatic disease. There is focal increased uptake at L5-S1 on the right, corresponding with degenerative disc disease and endplate sclerosis on previous CT. Asymmetric right mandibular activity is likely periodontal. Asymmetric soft tissue activity is present within the right breast, corresponding with the known right breast mass and/or subsequent therapy. Soft tissue activity otherwise appears unremarkable. IMPRESSION: 1. No evidence of osseous metastatic breast cancer. 2. Asymmetric osseous uptake on the right at L5-S1, corresponding with degenerative disc disease on recent CT. 3. Asymmetric soft tissue uptake in the right breast. Electronically Signed   By: WRichardean SaleM.D.   On: 03/16/2021 12:36   MM Outside Films Mammo  Result Date: 03/17/2021 This examination belongs to an outside facility and is stored here for comparison purposes only.  Contact the originating outside institution for any associated report or interpretation.  MM Outside Films Mammo  Result Date: 03/17/2021 This examination belongs to an outside facility and is stored here for comparison purposes only.  Contact the originating outside institution for any associated report or interpretation.  MM Outside Films Mammo  Result Date: 03/17/2021 This examination belongs to an outside facility and is stored here for comparison purposes only.  Contact the originating outside institution for any associated report or interpretation.  MM  Outside Films Mammo  Result Date: 03/17/2021 This examination belongs to an outside facility and is stored here for comparison purposes only.  Contact the originating outside institution for any associated report or interpretation.  MM Outside Films Mammo  Result Date: 03/17/2021 This examination belongs to an outside facility and is stored here for comparison purposes only.  Contact the originating outside institution for any associated report or interpretation.     ASSESSMENT & PLAN:  1. Invasive ductal carcinoma of right breast ([facility]   2. Neoplasm related pain   3. Hypomagnesemia   4. Hypokalemia   5. Encounter for antineoplastic chemotherapy   6. Generalized body aches    Cancer Staging  Invasive ductal carcinoma of right breast ([facility] Staging form: Breast, AJCC 8th Edition - Clinical stage from 02/19/2021: Stage IIIB (cT3, cN0, cM0, G2, ER-, PR-, HER2-) - Signed by YEarlie Server MD on 02/20/2021  #Clinical stage III triple negative right breast cancer. cT3 cN0-1 Baseline elevated CA 15-3 and CA 27.29. I have ordered repeat mammogram/ultrasound for evaluation of axillary lymph node with plan of biopsy.  Patient has not had done the image done yet Labs are reviewed and discussed with patient Proceed with Taxol today. Encourage oral hydration. Proceed with day 16 and day 17 short acting G-CSF support. Patient to take Claritin daily for 4 days.  Potential side effects of G-CSF were reviewed and discussed with patient  # Nausea, without vomiting Continue home antiemetics.  Symptom has improved.  # Hypokalemia, potassium has improved to 4.3 Continue potassium chloride 260m BID.   # Hypomagnesia, 1.2 she will receive IV mag sulfate 4g x 1.  I recommend patient to increase Slow-Mag 64 mg to BID  #  Hypocalcemia, continue calcium and vitamin D supplementation.  Calcium has improved. # Mucositis, magic mouth wash.  Symptom has improved.  #Acute on chronic sinusitis, symptom has improved  after treatments of Augmentin twice daily for 5 days.  ENT evaluation.  #Generalized body ache, right breast pain, negative bone scan. Continue gabapentin.  Continue follow-up with palliative care service.   No orders of the defined types were placed in this encounter.   All questions were answered. The patient knows to call the clinic with any problems questions or concerns.  cc Evern Bio H, NP    Return of visit:   Follow  up in 1 week for lab md carboplatin/Keytruda Taxol   Earlie Server, MD, PhD  03/30/2021

## 2021-03-31 ENCOUNTER — Ambulatory Visit: Payer: Medicaid Other

## 2021-03-31 ENCOUNTER — Inpatient Hospital Stay: Payer: Medicaid Other

## 2021-03-31 DIAGNOSIS — C50911 Malignant neoplasm of unspecified site of right female breast: Secondary | ICD-10-CM

## 2021-03-31 DIAGNOSIS — Z5112 Encounter for antineoplastic immunotherapy: Secondary | ICD-10-CM | POA: Diagnosis not present

## 2021-03-31 MED ORDER — FILGRASTIM-AAFI 300 MCG/0.5ML IJ SOSY
300.0000 ug | PREFILLED_SYRINGE | Freq: Once | INTRAMUSCULAR | Status: AC
  Administered 2021-03-31: 300 ug via SUBCUTANEOUS
  Filled 2021-03-31: qty 0.5

## 2021-04-01 ENCOUNTER — Ambulatory Visit: Payer: Medicaid Other

## 2021-04-01 ENCOUNTER — Inpatient Hospital Stay: Payer: Medicaid Other

## 2021-04-01 ENCOUNTER — Other Ambulatory Visit: Payer: Self-pay

## 2021-04-01 DIAGNOSIS — Z5112 Encounter for antineoplastic immunotherapy: Secondary | ICD-10-CM | POA: Diagnosis not present

## 2021-04-01 DIAGNOSIS — C50911 Malignant neoplasm of unspecified site of right female breast: Secondary | ICD-10-CM

## 2021-04-01 MED ORDER — FILGRASTIM-AAFI 300 MCG/0.5ML IJ SOSY
300.0000 ug | PREFILLED_SYRINGE | Freq: Once | INTRAMUSCULAR | Status: AC
  Administered 2021-04-01: 300 ug via SUBCUTANEOUS
  Filled 2021-04-01: qty 0.5

## 2021-04-02 ENCOUNTER — Institutional Professional Consult (permissible substitution): Payer: Medicaid Other | Admitting: Plastic Surgery

## 2021-04-06 ENCOUNTER — Inpatient Hospital Stay (HOSPITAL_BASED_OUTPATIENT_CLINIC_OR_DEPARTMENT_OTHER): Payer: Medicaid Other | Admitting: Oncology

## 2021-04-06 ENCOUNTER — Other Ambulatory Visit: Payer: Self-pay

## 2021-04-06 ENCOUNTER — Inpatient Hospital Stay: Payer: Medicaid Other

## 2021-04-06 ENCOUNTER — Encounter: Payer: Self-pay | Admitting: Oncology

## 2021-04-06 VITALS — BP 154/98 | HR 97 | Temp 98.5°F | Resp 18 | Wt 111.3 lb

## 2021-04-06 DIAGNOSIS — C50911 Malignant neoplasm of unspecified site of right female breast: Secondary | ICD-10-CM

## 2021-04-06 DIAGNOSIS — Z5112 Encounter for antineoplastic immunotherapy: Secondary | ICD-10-CM | POA: Diagnosis not present

## 2021-04-06 DIAGNOSIS — B3731 Acute candidiasis of vulva and vagina: Secondary | ICD-10-CM

## 2021-04-06 DIAGNOSIS — G893 Neoplasm related pain (acute) (chronic): Secondary | ICD-10-CM | POA: Diagnosis not present

## 2021-04-06 DIAGNOSIS — E876 Hypokalemia: Secondary | ICD-10-CM | POA: Diagnosis not present

## 2021-04-06 DIAGNOSIS — B379 Candidiasis, unspecified: Secondary | ICD-10-CM

## 2021-04-06 DIAGNOSIS — Z5111 Encounter for antineoplastic chemotherapy: Secondary | ICD-10-CM

## 2021-04-06 LAB — COMPREHENSIVE METABOLIC PANEL
ALT: 11 U/L (ref 0–44)
AST: 15 U/L (ref 15–41)
Albumin: 4 g/dL (ref 3.5–5.0)
Alkaline Phosphatase: 60 U/L (ref 38–126)
Anion gap: 7 (ref 5–15)
BUN: 15 mg/dL (ref 8–23)
CO2: 22 mmol/L (ref 22–32)
Calcium: 8.8 mg/dL — ABNORMAL LOW (ref 8.9–10.3)
Chloride: 103 mmol/L (ref 98–111)
Creatinine, Ser: 0.68 mg/dL (ref 0.44–1.00)
GFR, Estimated: >60 mL/min (ref >60)
Glucose, Bld: 92 mg/dL (ref 70–99)
Potassium: 3.9 mmol/L (ref 3.5–5.1)
Sodium: 132 mmol/L — ABNORMAL LOW (ref 135–145)
Total Bilirubin: 0.2 mg/dL — ABNORMAL LOW (ref 0.3–1.2)
Total Protein: 6.8 g/dL (ref 6.5–8.1)

## 2021-04-06 LAB — CBC WITH DIFFERENTIAL/PLATELET
Abs Immature Granulocytes: 0.6 10*3/uL — ABNORMAL HIGH (ref 0.00–0.07)
Basophils Absolute: 0.1 10*3/uL (ref 0.0–0.1)
Basophils Relative: 1 %
Eosinophils Absolute: 0 10*3/uL (ref 0.0–0.5)
Eosinophils Relative: 0 %
HCT: 30.7 % — ABNORMAL LOW (ref 36.0–46.0)
Hemoglobin: 10.2 g/dL — ABNORMAL LOW (ref 12.0–15.0)
Immature Granulocytes: 6 %
Lymphocytes Relative: 45 %
Lymphs Abs: 4.3 10*3/uL — ABNORMAL HIGH (ref 0.7–4.0)
MCH: 30.4 pg (ref 26.0–34.0)
MCHC: 33.2 g/dL (ref 30.0–36.0)
MCV: 91.6 fL (ref 80.0–100.0)
Monocytes Absolute: 1.1 10*3/uL — ABNORMAL HIGH (ref 0.1–1.0)
Monocytes Relative: 11 %
Neutro Abs: 3.5 10*3/uL (ref 1.7–7.7)
Neutrophils Relative %: 37 %
Platelets: 360 10*3/uL (ref 150–400)
RBC: 3.35 MIL/uL — ABNORMAL LOW (ref 3.87–5.11)
RDW: 14.5 % (ref 11.5–15.5)
WBC: 9.5 10*3/uL (ref 4.0–10.5)
nRBC: 0 % (ref 0.0–0.2)

## 2021-04-06 LAB — URINALYSIS, COMPLETE (UACMP) WITH MICROSCOPIC
Bilirubin Urine: NEGATIVE
Glucose, UA: NEGATIVE mg/dL
Hgb urine dipstick: NEGATIVE
Ketones, ur: NEGATIVE mg/dL
Leukocytes,Ua: NEGATIVE
Nitrite: NEGATIVE
Protein, ur: NEGATIVE mg/dL
Specific Gravity, Urine: 1.01 (ref 1.005–1.030)
pH: 5.5 (ref 5.0–8.0)

## 2021-04-06 LAB — MAGNESIUM: Magnesium: 1.5 mg/dL — ABNORMAL LOW (ref 1.7–2.4)

## 2021-04-06 MED ORDER — FAMOTIDINE IN NACL 20-0.9 MG/50ML-% IV SOLN
20.0000 mg | Freq: Once | INTRAVENOUS | Status: AC
  Administered 2021-04-06: 20 mg via INTRAVENOUS
  Filled 2021-04-06: qty 50

## 2021-04-06 MED ORDER — SODIUM CHLORIDE 0.9 % IV SOLN
150.0000 mg | Freq: Once | INTRAVENOUS | Status: AC
  Administered 2021-04-06: 150 mg via INTRAVENOUS
  Filled 2021-04-06: qty 150

## 2021-04-06 MED ORDER — MAGNESIUM SULFATE 2 GM/50ML IV SOLN
2.0000 g | Freq: Once | INTRAVENOUS | Status: AC
  Administered 2021-04-06: 2 g via INTRAVENOUS
  Filled 2021-04-06: qty 50

## 2021-04-06 MED ORDER — SODIUM CHLORIDE 0.9 % IV SOLN
200.0000 mg | Freq: Once | INTRAVENOUS | Status: AC
  Administered 2021-04-06: 200 mg via INTRAVENOUS
  Filled 2021-04-06: qty 200

## 2021-04-06 MED ORDER — SODIUM CHLORIDE 0.9 % IV SOLN
416.5000 mg | Freq: Once | INTRAVENOUS | Status: AC
  Administered 2021-04-06: 420 mg via INTRAVENOUS
  Filled 2021-04-06: qty 42

## 2021-04-06 MED ORDER — FLUCONAZOLE 150 MG PO TABS: 150.0000 mg | ORAL_TABLET | ORAL | 0 refills | Status: DC

## 2021-04-06 MED ORDER — SODIUM CHLORIDE 0.9% FLUSH
10.0000 mL | INTRAVENOUS | Status: DC | PRN
  Administered 2021-04-06: 10 mL via INTRAVENOUS
  Filled 2021-04-06: qty 10

## 2021-04-06 MED ORDER — SODIUM CHLORIDE 0.9 % IV SOLN
10.0000 mg | Freq: Once | INTRAVENOUS | Status: AC
  Administered 2021-04-06: 10 mg via INTRAVENOUS
  Filled 2021-04-06: qty 10

## 2021-04-06 MED ORDER — DIPHENHYDRAMINE HCL 50 MG/ML IJ SOLN
50.0000 mg | Freq: Once | INTRAMUSCULAR | Status: AC
  Administered 2021-04-06: 50 mg via INTRAVENOUS
  Filled 2021-04-06: qty 1

## 2021-04-06 MED ORDER — SODIUM CHLORIDE 0.9 % IV SOLN
Freq: Once | INTRAVENOUS | Status: AC
  Filled 2021-04-06: qty 250

## 2021-04-06 MED ORDER — SODIUM CHLORIDE 0.9 % IV SOLN
80.0000 mg/m2 | Freq: Once | INTRAVENOUS | Status: AC
  Administered 2021-04-06: 120 mg via INTRAVENOUS
  Filled 2021-04-06: qty 20

## 2021-04-06 MED ORDER — PALONOSETRON HCL INJECTION 0.25 MG/5ML
0.2500 mg | Freq: Once | INTRAVENOUS | Status: AC
  Administered 2021-04-06: 0.25 mg via INTRAVENOUS
  Filled 2021-04-06: qty 5

## 2021-04-06 MED ORDER — HEPARIN SOD (PORK) LOCK FLUSH 100 UNIT/ML IV SOLN
500.0000 [IU] | Freq: Once | INTRAVENOUS | Status: AC
  Administered 2021-04-06: 500 [IU] via INTRAVENOUS
  Filled 2021-04-06: qty 5

## 2021-04-06 NOTE — Patient Instructions (Signed)
[facility] [place]  Discharge Instructions: Thank you for choosing [facility] to provide your oncology and hematology care.  If you have a lab appointment with the [facility], please go directly to the [facility] and check in at the registration area.  Wear comfortable clothing and clothing appropriate for easy access to any Portacath or PICC line.   We strive to give you quality time with your provider. You may need to reschedule your appointment if you arrive late (15 or more minutes).  Arriving late affects you and other patients whose appointments are after yours.  Also, if you miss three or more appointments without notifying the office, you may be dismissed from the clinic at the providers discretion.      For prescription refill requests, have your pharmacy contact our office and allow 72 hours for refills to be completed.    Today you received the following chemotherapy and/or immunotherapy agents: Keytruda, Taxol, Carboplatin      To help prevent nausea and vomiting after your treatment, we encourage you to take your nausea medication as directed.  BELOW ARE SYMPTOMS THAT SHOULD BE REPORTED IMMEDIATELY: *FEVER GREATER THAN 100.4 F (38 C) OR HIGHER *CHILLS OR SWEATING *NAUSEA AND VOMITING THAT IS NOT CONTROLLED WITH YOUR NAUSEA MEDICATION *UNUSUAL SHORTNESS OF BREATH *UNUSUAL BRUISING OR BLEEDING *URINARY PROBLEMS (pain or burning when urinating, or frequent urination) *BOWEL PROBLEMS (unusual diarrhea, constipation, pain near the anus) TENDERNESS IN MOUTH AND THROAT WITH OR WITHOUT PRESENCE OF ULCERS (sore throat, sores in mouth, or a toothache) UNUSUAL RASH, SWELLING OR PAIN  UNUSUAL VAGINAL DISCHARGE OR ITCHING   Items with * indicate a potential emergency and should be followed up as soon as possible or go to the Emergency Department if any problems should occur.  Please show the CHEMOTHERAPY ALERT CARD or IMMUNOTHERAPY ALERT  CARD at check-in to the Emergency Department and triage nurse.  Should you have questions after your visit or need to cancel or reschedule your appointment, please contact [facility] [place] [facility] [place]  [phone] and follow the prompts.  Office hours are 8:00 a.m. to 4:30 p.m. Monday - Friday. Please note that voicemails left after 4:00 p.m. may not be returned until the following business day.  We are closed weekends and major holidays. You have access to a nurse at all times for urgent questions. Please call the main number to the clinic [phone] and follow the prompts.  For any non-urgent questions, you may also contact your provider using MyChart. We now offer e-Visits for anyone 1 and older to request care online for non-urgent symptoms. For details visit [email or web address].   Also download the MyChart app! Go to the app store, search "MyChart", open the app, select [facility], and log in with your MyChart username and password.  Due to Covid, a mask is required upon entering the hospital/clinic. If you do not have a mask, one will be given to you upon arrival. For doctor visits, patients may have 1 support person aged 45 or older with them. For treatment visits, patients cannot have anyone with them due to current Covid guidelines and our immunocompromised population. Carboplatin injection What is this medication? CARBOPLATIN (KAR boe pla tin) is a chemotherapy drug. It targets fast dividing cells, like cancer cells, and causes these cells to die. This medicine is used to treat ovarian cancer and many other cancers. This medicine may be used for other purposes; ask your health care provider or  pharmacist if you have questions. COMMON BRAND NAME(S): Paraplatin What should I tell my care team before I take this medication? They need to know if you have any of these conditions: blood disorders hearing problems kidney disease recent or ongoing radiation therapy an  unusual or allergic reaction to carboplatin, cisplatin, other chemotherapy, other medicines, foods, dyes, or preservatives pregnant or trying to get pregnant breast-feeding How should I use this medication? This drug is usually given as an infusion into a vein. It is administered in a hospital or clinic by a specially trained health care professional. Talk to your pediatrician regarding the use of this medicine in children. Special care may be needed. Overdosage: If you think you have taken too much of this medicine contact a poison control center or emergency room at once. NOTE: This medicine is only for you. Do not share this medicine with others. What if I miss a dose? It is important not to miss a dose. Call your doctor or health care professional if you are unable to keep an appointment. What may interact with this medication? medicines for seizures medicines to increase blood counts like filgrastim, pegfilgrastim, sargramostim some antibiotics like amikacin, gentamicin, neomycin, streptomycin, tobramycin vaccines Talk to your doctor or health care professional before taking any of these medicines: acetaminophen aspirin ibuprofen ketoprofen naproxen This list may not describe all possible interactions. Give your health care provider a list of all the medicines, herbs, non-prescription drugs, or dietary supplements you use. Also tell them if you smoke, drink alcohol, or use illegal drugs. Some items may interact with your medicine. What should I watch for while using this medication? Your condition will be monitored carefully while you are receiving this medicine. You will need important blood work done while you are taking this medicine. This drug may make you feel generally unwell. This is not uncommon, as chemotherapy can affect healthy cells as well as cancer cells. Report any side effects. Continue your course of treatment even though you feel ill unless your doctor tells you to  stop. In some cases, you may be given additional medicines to help with side effects. Follow all directions for their use. Call your doctor or health care professional for advice if you get a fever, chills or sore throat, or other symptoms of a cold or flu. Do not treat yourself. This drug decreases your body's ability to fight infections. Try to avoid being around people who are sick. This medicine may increase your risk to bruise or bleed. Call your doctor or health care professional if you notice any unusual bleeding. Be careful brushing and flossing your teeth or using a toothpick because you may get an infection or bleed more easily. If you have any dental work done, tell your dentist you are receiving this medicine. Avoid taking products that contain aspirin, acetaminophen, ibuprofen, naproxen, or ketoprofen unless instructed by your doctor. These medicines may hide a fever. Do not become pregnant while taking this medicine. Women should inform their doctor if they wish to become pregnant or think they might be pregnant. There is a potential for serious side effects to an unborn child. Talk to your health care professional or pharmacist for more information. Do not breast-feed an infant while taking this medicine. What side effects may I notice from receiving this medication? Side effects that you should report to your doctor or health care professional as soon as possible: allergic reactions like skin rash, itching or hives, swelling of the face, lips, or tongue  signs of infection - fever or chills, cough, sore throat, pain or difficulty passing urine signs of decreased platelets or bleeding - bruising, pinpoint red spots on the skin, black, tarry stools, nosebleeds signs of decreased red blood cells - unusually weak or tired, fainting spells, lightheadedness breathing problems changes in hearing changes in vision chest pain high blood pressure low blood counts - This drug may decrease the  number of white blood cells, red blood cells and platelets. You may be at increased risk for infections and bleeding. nausea and vomiting pain, swelling, redness or irritation at the injection site pain, tingling, numbness in the hands or feet problems with balance, talking, walking trouble passing urine or change in the amount of urine Side effects that usually do not require medical attention (report to your doctor or health care professional if they continue or are bothersome): hair loss loss of appetite metallic taste in the mouth or changes in taste This list may not describe all possible side effects. Call your doctor for medical advice about side effects. You may report side effects to FDA at 1-800-FDA-1088. Where should I keep my medication? This drug is given in a hospital or clinic and will not be stored at home. NOTE: This sheet is a summary. It may not cover all possible information. If you have questions about this medicine, talk to your doctor, pharmacist, or health care provider.  2022 Elsevier/Gold Standard (2007-08-08 00:00:00) Paclitaxel injection What is this medication? PACLITAXEL (PAK li TAX el) is a chemotherapy drug. It targets fast dividing cells, like cancer cells, and causes these cells to die. This medicine is used to treat ovarian cancer, breast cancer, lung cancer, Kaposi's sarcoma, and other cancers. This medicine may be used for other purposes; ask your health care provider or pharmacist if you have questions. COMMON BRAND NAME(S): Onxol, Taxol What should I tell my care team before I take this medication? They need to know if you have any of these conditions: history of irregular heartbeat liver disease low blood counts, like low white cell, platelet, or red cell counts lung or breathing disease, like asthma tingling of the fingers or toes, or other nerve disorder an unusual or allergic reaction to paclitaxel, alcohol, polyoxyethylated castor oil, other  chemotherapy, other medicines, foods, dyes, or preservatives pregnant or trying to get pregnant breast-feeding How should I use this medication? This drug is given as an infusion into a vein. It is administered in a hospital or clinic by a specially trained health care professional. Talk to your pediatrician regarding the use of this medicine in children. Special care may be needed. Overdosage: If you think you have taken too much of this medicine contact a poison control center or emergency room at once. NOTE: This medicine is only for you. Do not share this medicine with others. What if I miss a dose? It is important not to miss your dose. Call your doctor or health care professional if you are unable to keep an appointment. What may interact with this medication? Do not take this medicine with any of the following medications: live virus vaccines This medicine may also interact with the following medications: antiviral medicines for hepatitis, HIV or AIDS certain antibiotics like erythromycin and clarithromycin certain medicines for fungal infections like ketoconazole and itraconazole certain medicines for seizures like carbamazepine, phenobarbital, phenytoin gemfibrozil nefazodone rifampin St. John's wort This list may not describe all possible interactions. Give your health care provider a list of all the medicines, herbs, non-prescription drugs,  or dietary supplements you use. Also tell them if you smoke, drink alcohol, or use illegal drugs. Some items may interact with your medicine. What should I watch for while using this medication? Your condition will be monitored carefully while you are receiving this medicine. You will need important blood work done while you are taking this medicine. This medicine can cause serious allergic reactions. To reduce your risk you will need to take other medicine(s) before treatment with this medicine. If you experience allergic reactions like skin  rash, itching or hives, swelling of the face, lips, or tongue, tell your doctor or health care professional right away. In some cases, you may be given additional medicines to help with side effects. Follow all directions for their use. This drug may make you feel generally unwell. This is not uncommon, as chemotherapy can affect healthy cells as well as cancer cells. Report any side effects. Continue your course of treatment even though you feel ill unless your doctor tells you to stop. Call your doctor or health care professional for advice if you get a fever, chills or sore throat, or other symptoms of a cold or flu. Do not treat yourself. This drug decreases your body's ability to fight infections. Try to avoid being around people who are sick. This medicine may increase your risk to bruise or bleed. Call your doctor or health care professional if you notice any unusual bleeding. Be careful brushing and flossing your teeth or using a toothpick because you may get an infection or bleed more easily. If you have any dental work done, tell your dentist you are receiving this medicine. Avoid taking products that contain aspirin, acetaminophen, ibuprofen, naproxen, or ketoprofen unless instructed by your doctor. These medicines may hide a fever. Do not become pregnant while taking this medicine. Women should inform their doctor if they wish to become pregnant or think they might be pregnant. There is a potential for serious side effects to an unborn child. Talk to your health care professional or pharmacist for more information. Do not breast-feed an infant while taking this medicine. Men are advised not to father a child while receiving this medicine. This product may contain alcohol. Ask your pharmacist or healthcare provider if this medicine contains alcohol. Be sure to tell all healthcare providers you are taking this medicine. Certain medicines, like metronidazole and disulfiram, can cause an unpleasant  reaction when taken with alcohol. The reaction includes flushing, headache, nausea, vomiting, sweating, and increased thirst. The reaction can last from 30 minutes to several hours. What side effects may I notice from receiving this medication? Side effects that you should report to your doctor or health care professional as soon as possible: allergic reactions like skin rash, itching or hives, swelling of the face, lips, or tongue breathing problems changes in vision fast, irregular heartbeat high or low blood pressure mouth sores pain, tingling, numbness in the hands or feet signs of decreased platelets or bleeding - bruising, pinpoint red spots on the skin, black, tarry stools, blood in the urine signs of decreased red blood cells - unusually weak or tired, feeling faint or lightheaded, falls signs of infection - fever or chills, cough, sore throat, pain or difficulty passing urine signs and symptoms of liver injury like dark yellow or brown urine; general ill feeling or flu-like symptoms; light-colored stools; loss of appetite; nausea; right upper belly pain; unusually weak or tired; yellowing of the eyes or skin swelling of the ankles, feet, hands unusually slow heartbeat  Side effects that usually do not require medical attention (report to your doctor or health care professional if they continue or are bothersome): diarrhea hair loss loss of appetite muscle or joint pain nausea, vomiting pain, redness, or irritation at site where injected tiredness This list may not describe all possible side effects. Call your doctor for medical advice about side effects. You may report side effects to FDA at 1-800-FDA-1088. Where should I keep my medication? This drug is given in a hospital or clinic and will not be stored at home. NOTE: This sheet is a summary. It may not cover all possible information. If you have questions about this medicine, talk to your doctor, pharmacist, or health care  provider.  2022 Elsevier/Gold Standard (2020-11-17 00:00:00) Pembrolizumab injection What is this medication? PEMBROLIZUMAB (pem broe liz ue mab) is a monoclonal antibody. It is used to treat certain types of cancer. This medicine may be used for other purposes; ask your health care provider or pharmacist if you have questions. COMMON BRAND NAME(S): Keytruda What should I tell my care team before I take this medication? They need to know if you have any of these conditions: autoimmune diseases like Crohn's disease, ulcerative colitis, or lupus have had or planning to have an allogeneic stem cell transplant (uses someone else's stem cells) history of organ transplant history of chest radiation nervous system problems like myasthenia gravis or Guillain-Barre syndrome an unusual or allergic reaction to pembrolizumab, other medicines, foods, dyes, or preservatives pregnant or trying to get pregnant breast-feeding How should I use this medication? This medicine is for infusion into a vein. It is given by a health care professional in a hospital or clinic setting. A special MedGuide will be given to you before each treatment. Be sure to read this information carefully each time. Talk to your pediatrician regarding the use of this medicine in children. While this drug may be prescribed for children as young as 6 months for selected conditions, precautions do apply. Overdosage: If you think you have taken too much of this medicine contact a poison control center or emergency room at once. NOTE: This medicine is only for you. Do not share this medicine with others. What if I miss a dose? It is important not to miss your dose. Call your doctor or health care professional if you are unable to keep an appointment. What may interact with this medication? Interactions have not been studied. This list may not describe all possible interactions. Give your health care provider a list of all the medicines,  herbs, non-prescription drugs, or dietary supplements you use. Also tell them if you smoke, drink alcohol, or use illegal drugs. Some items may interact with your medicine. What should I watch for while using this medication? Your condition will be monitored carefully while you are receiving this medicine. You may need blood work done while you are taking this medicine. Do not become pregnant while taking this medicine or for 4 months after stopping it. Women should inform their doctor if they wish to become pregnant or think they might be pregnant. There is a potential for serious side effects to an unborn child. Talk to your health care professional or pharmacist for more information. Do not breast-feed an infant while taking this medicine or for 4 months after the last dose. What side effects may I notice from receiving this medication? Side effects that you should report to your doctor or health care professional as soon as possible: allergic reactions like skin  rash, itching or hives, swelling of the face, lips, or tongue bloody or black, tarry breathing problems changes in vision chest pain chills confusion constipation cough diarrhea dizziness or feeling faint or lightheaded fast or irregular heartbeat fever flushing joint pain low blood counts - this medicine may decrease the number of white blood cells, red blood cells and platelets. You may be at increased risk for infections and bleeding. muscle pain muscle weakness pain, tingling, numbness in the hands or feet persistent headache redness, blistering, peeling or loosening of the skin, including inside the mouth signs and symptoms of high blood sugar such as dizziness; dry mouth; dry skin; fruity breath; nausea; stomach pain; increased hunger or thirst; increased urination signs and symptoms of kidney injury like trouble passing urine or change in the amount of urine signs and symptoms of liver injury like dark urine,  light-colored stools, loss of appetite, nausea, right upper belly pain, yellowing of the eyes or skin sweating swollen lymph nodes weight loss Side effects that usually do not require medical attention (report to your doctor or health care professional if they continue or are bothersome): decreased appetite hair loss tiredness This list may not describe all possible side effects. Call your doctor for medical advice about side effects. You may report side effects to FDA at 1-800-FDA-1088. Where should I keep my medication? This drug is given in a hospital or clinic and will not be stored at home. NOTE: This sheet is a summary. It may not cover all possible information. If you have questions about this medicine, talk to your doctor, pharmacist, or health care provider.  2022 Elsevier/Gold Standard (2020-11-17 00:00:00)

## 2021-04-06 NOTE — Progress Notes (Signed)
Patient here for follow up. Pt reports that perineum is raw.

## 2021-04-06 NOTE — Progress Notes (Signed)
Hematology/Oncology progress note [phone] [phone]      Patient Care Team: [name], NP as PCP - General (Nurse Practitioner)  REFERRING PROVIDER: [name], NP  CHIEF COMPLAINTS/REASON FOR VISIT:  Follow-up for triple negative right breast cancer.  HISTORY OF PRESENTING ILLNESS:   Yvonne Scott is a  61 y.o.  female with PMH listed below was seen in consultation at the request of  [name], NP  for evaluation of breast cancer.  August 2022, patient was diagnosed with right breast stage IIIb cT3 N0M0, grade 2, ER negative, PR negative HER2 negative breast cancer.  Patient self palpated breast mass many years ago.  10/21/2020 diagnostic mammogram and ultrasound confirmed presence of mass and a subsequent biopsy revealed right breast triple negative cancer. There was plan for patient to start with neoadjuvant chemotherapy followed by surgery and radiation.  Unfortunately patient never started treatment.  Patient was accompanied by her mother.  They want to transfer her oncology care to locally.  Patient reports having right breast pain as well as generalized pain.  Patient had a [name] placed 3 weeks ago and she reports some swelling around the sites.  Patient has had negative genetic testing at [facility].  Patient has a history of alcohol dependence.  Per patient she has not been drinking recently.  Everyday smoker.   02/24/2021, CT chest abdomen pelvis showed large right breast mass, 5.5 x 4.8 cm with small right axillary lymph node with variable degrees of enhancement potentially involved but not specific based on size.  This tracked down to retropectoral nodal stations largest lymph node along the lateral margin of the pectoralis major.  Tiny pulmonary nodules in the chest as discussed.  These are nonspecific but would consider short interval follow-up to assess for any changes.  No evidence of metastatic disease involving the abdomen or  pelvis.  Aortic atherosclerosis.  02/25/2021, echocardiogram showed LVEF 65 to 70%.  Patient has been to chemotherapy class and understands antiemetics instructions.  03/16/2021, bone scan showed no evidence of osseous metastatic breast cancer, asymmetric osseous uptake on the right at L5-S1, corresponding to degenerative disc disease on recent CT.  Asymmetric soft tissue uptake in the right breast.  INTERVAL HISTORY Yvonne Scott is a 61 y.o. female who has above history reviewed by me today presents for follow up visit for management of triple negative breast cancer, stage IIIb Patient is neoadjuvant chemotherapy with carboplatin/Taxol/Keytruda. So far she tolerates treatments.  She got short acting G-CSF support and tolerates well. Patient reports some vagina is "raw', some itchiness.  Denies dysuria. Denies nausea vomiting diarrhea, fever or chills.   Review of Systems  Constitutional:  Positive for fatigue. Negative for appetite change, chills and fever.  HENT:   Positive for mouth sores. Negative for hearing loss and voice change.   Eyes:  Negative for eye problems.  Respiratory:  Negative for chest tightness and cough.   Cardiovascular:  Negative for chest pain.  Gastrointestinal:  Negative for abdominal distention, abdominal pain, blood in stool and nausea.  Endocrine: Negative for hot flashes.  Genitourinary:  Negative for difficulty urinating and frequency.   Musculoskeletal:  Negative for arthralgias.       Generalized body aches  Skin:  Negative for itching and rash.  Neurological:  Negative for extremity weakness.  Hematological:  Negative for adenopathy.  Psychiatric/Behavioral:  Negative for confusion.   Vaginal itchiness  MEDICAL HISTORY:  Past Medical History:  Diagnosis Date  Anxiety    Aortic atherosclerosis ([place]) 10/24/2016   Breast cancer (H[facility])    GERD (gastroesophageal reflux disease)     SURGICAL HISTORY: No past surgical history on file.  SOCIAL  HISTORY: Social History   Socioeconomic History   Marital status: Widowed    Spouse name: Not on file   Number of children: Not on file   Years of education: Not on file   Highest education level: Not on file  Occupational History   Not on file  Tobacco Use   Smoking status: Every Day    Packs/day: 1.00    Years: 36.00    Pack years: 36.00    Types: Cigarettes    Start date: 03/15/1979   Smokeless tobacco: Never  Vaping Use   Vaping Use: Never used  Substance and Sexual Activity   Alcohol use: No    Alcohol/week: 3.0 standard drinks    Types: 3 Standard drinks or equivalent per week   Drug use: Not Currently   Sexual activity: Not Currently    Birth control/protection: None  Other Topics Concern   Not on file  Social History Narrative   Not on file   Social Determinants of Health   Financial Resource Strain: Not on file  Food Insecurity: Not on file  Transportation Needs: Not on file  Physical Activity: Not on file  Stress: Not on file  Social Connections: Not on file  Intimate Partner Violence: Not on file    FAMILY HISTORY: Family History  Problem Relation Age of Onset   Cancer Mother 1       breast   Cancer Father        melonma   Cirrhosis Maternal Grandfather    Cancer Paternal Grandmother        skin/breast    ALLERGIES:  has No Known Allergies.  MEDICATIONS:  Current Outpatient Medications  Medication Sig Dispense Refill   amoxicillin-clavulanate (AUGMENTIN) 875-125 MG tablet Take 1 tablet by mouth 2 (two) times daily. 10 tablet 0   calcium-vitamin D (OSCAL WITH D) 500-5 MG-MCG tablet Take 2 tablets by mouth daily. 60 tablet 5   dexamethasone (DECADRON) 4 MG tablet Take 2 tablets once a day for 2 days after carboplatin and AC chemotherapy. Take with food. 30 tablet 1   folic acid (FOLVITE) 1 MG tablet Take 1 tablet (1 mg total) by mouth daily. 30 tablet 0   gabapentin (NEURONTIN) 100 MG capsule Take 1 capsule (100 mg total) by mouth 3 (three)  times daily. 30 capsule 0   guaiFENesin (MUCINEX) 600 MG 12 hr tablet Take 2 tablets (1,200 mg total) by mouth 2 (two) times daily. 60 tablet 0   ibuprofen (ADVIL) 200 MG tablet Take 200 mg by mouth every 6 (six) hours as needed for headache.     lidocaine-prilocaine (EMLA) cream Apply to affected area once 30 g 3   magic mouthwash (multi-ingredient) oral suspension Take 5 mLs by mouth 4 (four) times daily as needed for mouth pain. (Patient not taking: Reported on 03/23/2021) 480 mL 1   magnesium chloride (SLOW-MAG) 64 MG TBEC SR tablet Take 1 tablet (64 mg total) by mouth 2 (two) times daily. 60 tablet 2   Multiple Vitamin (MULTIVITAMIN WITH MINERALS) TABS tablet Take 1 tablet by mouth daily. 30 tablet 0   omeprazole (PRILOSEC) 20 MG capsule Take 20 mg by mouth daily as needed (reflux).     ondansetron (ZOFRAN) 8 MG tablet Take 1 tablet (8 mg total) by mouth  2 (two) times daily as needed. Start on the third day after carboplatin and AC chemotherapy. (Patient not taking: Reported on 03/23/2021) 30 tablet 1   potassium chloride SA (KLOR-CON M) 20 MEQ tablet Take 1 tablet (20 mEq total) by mouth 2 (two) times daily. 60 tablet 0   prochlorperazine (COMPAZINE) 10 MG tablet Take 1 tablet (10 mg total) by mouth every 6 (six) hours as needed (Nausea or vomiting). 30 tablet 1   thiamine 100 MG tablet Take 0.5 tablets (50 mg total) by mouth daily. 15 tablet 0   No current facility-administered medications for this visit.   Facility-Administered Medications Ordered in Other Visits  Medication Dose Route Frequency Provider Last Rate Last Admin   heparin lock flush 100 unit/mL  500 Units Intravenous Once Earlie Server, MD       sodium chloride flush (NS) 0.9 % injection 10 mL  10 mL Intravenous PRN Earlie Server, MD   10 mL at 04/06/21 0827     PHYSICAL EXAMINATION: ECOG PERFORMANCE STATUS: 1 - Symptomatic but completely ambulatory Vitals:   04/06/21 0844  BP: (!) 154/98  Pulse: 97  Resp: 18  Temp: 98.5 F  (36.9 C)   Filed Weights   04/06/21 0844  Weight: 111 lb 4.8 oz (50.5 kg)    Physical Exam Constitutional:      General: She is not in acute distress. HENT:     Head: Normocephalic and atraumatic.  Eyes:     General: No scleral icterus. Cardiovascular:     Rate and Rhythm: Normal rate and regular rhythm.     Heart sounds: Normal heart sounds.  Pulmonary:     Effort: Pulmonary effort is normal. No respiratory distress.     Breath sounds: No wheezing.  Abdominal:     General: Bowel sounds are normal. There is no distension.     Palpations: Abdomen is soft.  Musculoskeletal:        General: No deformity. Normal range of motion.     Cervical back: Normal range of motion and neck supple.  Skin:    General: Skin is warm and dry.     Findings: No erythema or rash.  Neurological:     Mental Status: She is alert and oriented to person, place, and time. Mental status is at baseline.     Cranial Nerves: No cranial nerve deficit.     Coordination: Coordination normal.  Psychiatric:        Mood and Affect: Mood normal.    LABORATORY DATA:  I have reviewed the data as listed Lab Results  Component Value Date   WBC 6.3 03/30/2021   HGB 10.2 (L) 03/30/2021   HCT 29.9 (L) 03/30/2021   MCV 90.6 03/30/2021   PLT 202 03/30/2021   Recent Labs    03/18/21 0836 03/19/21 0905 03/23/21 0810 03/30/21 0832  NA 132* 132* 134* 133*  K 2.9* 3.5 3.7 4.3  CL 95* 102 106 101  CO2 22 22 23  21*  GLUCOSE 99 91 83 85  BUN 19 26* 12 11  CREATININE 1.23* 1.23* 1.00 0.79  CALCIUM 8.2* 8.0* 8.1* 8.8*  GFRNONAA 50* 50* >60 >60  PROT 6.8  --  5.9* 6.3*  ALBUMIN 3.8  --  3.4* 3.7  AST 19  --  14* 14*  ALT 13  --  12 8  ALKPHOS 58  --  47 46  BILITOT 0.8  --  0.3 0.2*    Iron/TIBC/Ferritin/ %Sat    Component Value  Date/Time   IRON 80 10/24/2016 1534   TIBC 282 10/24/2016 1534   FERRITIN 282 (H) 11/17/2016 1404   IRONPCTSAT 28 10/24/2016 1534       RADIOGRAPHIC STUDIES: I have  personally reviewed the radiological images as listed and agreed with the findings in the report. NM Bone Scan Whole Body  Result Date: 03/16/2021 CLINICAL DATA:  Invasive triple negative ductal carcinoma the right breast. Right breast pain radiating into the arm. EXAM: NUCLEAR MEDICINE WHOLE BODY BONE SCAN TECHNIQUE: Whole body anterior and posterior images were obtained approximately 3 hours after intravenous injection of radiopharmaceutical. RADIOPHARMACEUTICALS:  20.46 mCi Technetium-80mMDP IV COMPARISON:  CTs of the chest, abdomen and pelvis 02/24/2021. No previous bone scan. FINDINGS: There is no osseous uptake highly suspicious for metastatic disease. There is focal increased uptake at L5-S1 on the right, corresponding with degenerative disc disease and endplate sclerosis on previous CT. Asymmetric right mandibular activity is likely periodontal. Asymmetric soft tissue activity is present within the right breast, corresponding with the known right breast mass and/or subsequent therapy. Soft tissue activity otherwise appears unremarkable. IMPRESSION: 1. No evidence of osseous metastatic breast cancer. 2. Asymmetric osseous uptake on the right at L5-S1, corresponding with degenerative disc disease on recent CT. 3. Asymmetric soft tissue uptake in the right breast. Electronically Signed   By: WRichardean SaleM.D.   On: 03/16/2021 12:36   MM Outside Films Mammo  Result Date: 03/17/2021 This examination belongs to an outside facility and is stored here for comparison purposes only.  Contact the originating outside institution for any associated report or interpretation.  MM Outside Films Mammo  Result Date: 03/17/2021 This examination belongs to an outside facility and is stored here for comparison purposes only.  Contact the originating outside institution for any associated report or interpretation.  MM Outside Films Mammo  Result Date: 03/17/2021 This examination belongs to an outside facility and  is stored here for comparison purposes only.  Contact the originating outside institution for any associated report or interpretation.  MM Outside Films Mammo  Result Date: 03/17/2021 This examination belongs to an outside facility and is stored here for comparison purposes only.  Contact the originating outside institution for any associated report or interpretation.  MM Outside Films Mammo  Result Date: 03/17/2021 This examination belongs to an outside facility and is stored here for comparison purposes only.  Contact the originating outside institution for any associated report or interpretation.     ASSESSMENT & PLAN:  1. Invasive ductal carcinoma of right breast ([facility]   2. Neoplasm related pain   3. Hypomagnesemia   4. Hypokalemia   5. Encounter for antineoplastic chemotherapy   6. Yeast infection   7. Vaginal candidiasis    Cancer Staging  Invasive ductal carcinoma of right breast ([facility] Staging form: Breast, AJCC 8th Edition - Clinical stage from 02/19/2021: Stage IIIB (cT3, cN0, cM0, G2, ER-, PR-, HER2-) - Signed by YEarlie Server MD on 02/20/2021  #Clinical stage III triple negative right breast cancer. cT3 cN0-1 Baseline elevated CA 15-3 and CA 27.29. I have ordered repeat mammogram/ultrasound for evaluation of axillary lymph node with plan of biopsy.  Patient has not had done the image done yet- Labs are reviewed and discussed with patient Labs reviewed and discussed with patient. Proceed with cycle 2 carboplatin/Keytruda/ Taxol.  #Vaginal itchiness, likely yeast infection secondary to recent antibiotic treatments..  I recommend patient to take fluconazole 150 mg empirically x1.  Repeat another dose 3 days later.  Prescription was sent to pharmacy.  Check UA  # Nausea, without vomiting Continue home antiemetics.  Stable symptoms.  # Hypokalemia, potassium 3.9. Continue potassium chloride 20mq BID.   # Hypomagnesia, 1.5,  she will receive IV mag sulfate 2g x 1.  Continue  Slow-Mag 64 mg to BID  #Hypocalcemia, continue calcium and vitamin D supplementation.  Calcium has improved. # Mucositis, continue magic mouth wash.  Symptom has improved.  #Generalized body ache, right breast pain, negative bone scan. Continue gabapentin.  Continue follow-up with palliative care service.   Orders Placed This Encounter  Procedures   Urinalysis, Complete w Microscopic    All questions were answered. The patient knows to call the clinic with any problems questions or concerns.  cc BEvern BioH, NP    Return of visit:   Follow  up in 1 week for lab md carboplatin/Keytruda Taxol   ZEarlie Server MD, PhD  04/06/2021

## 2021-04-13 ENCOUNTER — Encounter: Payer: Self-pay | Admitting: Oncology

## 2021-04-13 ENCOUNTER — Inpatient Hospital Stay: Payer: Medicaid Other

## 2021-04-13 ENCOUNTER — Other Ambulatory Visit: Payer: Self-pay

## 2021-04-13 ENCOUNTER — Inpatient Hospital Stay: Payer: Medicaid Other | Admitting: Oncology

## 2021-04-13 VITALS — BP 112/74 | HR 102 | Temp 98.2°F | Wt 110.0 lb

## 2021-04-13 DIAGNOSIS — E876 Hypokalemia: Secondary | ICD-10-CM

## 2021-04-13 DIAGNOSIS — C50911 Malignant neoplasm of unspecified site of right female breast: Secondary | ICD-10-CM

## 2021-04-13 DIAGNOSIS — G893 Neoplasm related pain (acute) (chronic): Secondary | ICD-10-CM

## 2021-04-13 DIAGNOSIS — Z5111 Encounter for antineoplastic chemotherapy: Secondary | ICD-10-CM

## 2021-04-13 DIAGNOSIS — Z5112 Encounter for antineoplastic immunotherapy: Secondary | ICD-10-CM | POA: Diagnosis not present

## 2021-04-13 LAB — CBC WITH DIFFERENTIAL/PLATELET
Abs Immature Granulocytes: 0.04 10*3/uL (ref 0.00–0.07)
Basophils Absolute: 0 10*3/uL (ref 0.0–0.1)
Basophils Relative: 0 %
Eosinophils Absolute: 0 10*3/uL (ref 0.0–0.5)
Eosinophils Relative: 0 %
HCT: 27.4 % — ABNORMAL LOW (ref 36.0–46.0)
Hemoglobin: 9.3 g/dL — ABNORMAL LOW (ref 12.0–15.0)
Immature Granulocytes: 1 %
Lymphocytes Relative: 55 %
Lymphs Abs: 4.1 10*3/uL — ABNORMAL HIGH (ref 0.7–4.0)
MCH: 31.2 pg (ref 26.0–34.0)
MCHC: 33.9 g/dL (ref 30.0–36.0)
MCV: 91.9 fL (ref 80.0–100.0)
Monocytes Absolute: 0.4 10*3/uL (ref 0.1–1.0)
Monocytes Relative: 5 %
Neutro Abs: 2.9 10*3/uL (ref 1.7–7.7)
Neutrophils Relative %: 39 %
Platelets: 277 10*3/uL (ref 150–400)
RBC: 2.98 MIL/uL — ABNORMAL LOW (ref 3.87–5.11)
RDW: 14.6 % (ref 11.5–15.5)
WBC: 7.5 10*3/uL (ref 4.0–10.5)
nRBC: 0 % (ref 0.0–0.2)

## 2021-04-13 LAB — COMPREHENSIVE METABOLIC PANEL
ALT: 10 U/L (ref 0–44)
AST: 20 U/L (ref 15–41)
Albumin: 3.7 g/dL (ref 3.5–5.0)
Alkaline Phosphatase: 49 U/L (ref 38–126)
Anion gap: 11 (ref 5–15)
BUN: 14 mg/dL (ref 8–23)
CO2: 23 mmol/L (ref 22–32)
Calcium: 8.4 mg/dL — ABNORMAL LOW (ref 8.9–10.3)
Chloride: 98 mmol/L (ref 98–111)
Creatinine, Ser: 0.66 mg/dL (ref 0.44–1.00)
GFR, Estimated: >60 mL/min (ref >60)
Glucose, Bld: 97 mg/dL (ref 70–99)
Potassium: 3 mmol/L — ABNORMAL LOW (ref 3.5–5.1)
Sodium: 132 mmol/L — ABNORMAL LOW (ref 135–145)
Total Bilirubin: 0.1 mg/dL — ABNORMAL LOW (ref 0.3–1.2)
Total Protein: 6.5 g/dL (ref 6.5–8.1)

## 2021-04-13 LAB — MAGNESIUM: Magnesium: 1.2 mg/dL — ABNORMAL LOW (ref 1.7–2.4)

## 2021-04-13 MED ORDER — POTASSIUM CHLORIDE CRYS ER 20 MEQ PO TBCR: 20.0000 meq | EXTENDED_RELEASE_TABLET | Freq: Two times a day (BID) | ORAL | 1 refills | Status: DC

## 2021-04-13 MED ORDER — SODIUM CHLORIDE 0.9 % IV SOLN
10.0000 mg | Freq: Once | INTRAVENOUS | Status: AC
  Administered 2021-04-13: 10 mg via INTRAVENOUS
  Filled 2021-04-13: qty 10

## 2021-04-13 MED ORDER — HEPARIN SOD (PORK) LOCK FLUSH 100 UNIT/ML IV SOLN
INTRAVENOUS | Status: AC
  Administered 2021-04-13: 500 [IU]
  Filled 2021-04-13: qty 5

## 2021-04-13 MED ORDER — SODIUM CHLORIDE 0.9 % IV SOLN
Freq: Once | INTRAVENOUS | Status: AC
  Filled 2021-04-13: qty 250

## 2021-04-13 MED ORDER — DIPHENHYDRAMINE HCL 50 MG/ML IJ SOLN
50.0000 mg | Freq: Once | INTRAMUSCULAR | Status: AC
  Administered 2021-04-13: 50 mg via INTRAVENOUS
  Filled 2021-04-13: qty 1

## 2021-04-13 MED ORDER — SODIUM CHLORIDE 0.9 % IV SOLN
80.0000 mg/m2 | Freq: Once | INTRAVENOUS | Status: AC
  Administered 2021-04-13: 120 mg via INTRAVENOUS
  Filled 2021-04-13: qty 20

## 2021-04-13 MED ORDER — HEPARIN SOD (PORK) LOCK FLUSH 100 UNIT/ML IV SOLN
500.0000 [IU] | Freq: Once | INTRAVENOUS | Status: AC | PRN
  Filled 2021-04-13: qty 5

## 2021-04-13 MED ORDER — MAGNESIUM SULFATE 4 GM/100ML IV SOLN
4.0000 g | Freq: Once | INTRAVENOUS | Status: AC
  Administered 2021-04-13: 4 g via INTRAVENOUS
  Filled 2021-04-13: qty 100

## 2021-04-13 MED ORDER — FAMOTIDINE IN NACL 20-0.9 MG/50ML-% IV SOLN
20.0000 mg | Freq: Once | INTRAVENOUS | Status: AC
  Administered 2021-04-13: 20 mg via INTRAVENOUS
  Filled 2021-04-13: qty 50

## 2021-04-13 NOTE — Patient Instructions (Signed)
[facility]  Discharge Instructions: Thank you for choosing [facility] to provide your oncology and hematology care.  If you have a lab appointment with the Cancer Center, please go directly to the Cancer Center and check in at the registration area.  Wear comfortable clothing and clothing appropriate for easy access to any Portacath or PICC line.   We strive to give you quality time with your provider. You may need to reschedule your appointment if you arrive late (15 or more minutes).  Arriving late affects you and other patients whose appointments are after yours.  Also, if you miss three or more appointments without notifying the office, you may be dismissed from the clinic at the provider's discretion.      For prescription refill requests, have your pharmacy contact our office and allow 72 hours for refills to be completed.    Today you received the following chemotherapy and/or immunotherapy agents Taxol      To help prevent nausea and vomiting after your treatment, we encourage you to take your nausea medication as directed.  BELOW ARE SYMPTOMS THAT SHOULD BE REPORTED IMMEDIATELY: *FEVER GREATER THAN 100.4 F (38 C) OR HIGHER *CHILLS OR SWEATING *NAUSEA AND VOMITING THAT IS NOT CONTROLLED WITH YOUR NAUSEA MEDICATION *UNUSUAL SHORTNESS OF BREATH *UNUSUAL BRUISING OR BLEEDING *URINARY PROBLEMS (pain or burning when urinating, or frequent urination) *BOWEL PROBLEMS (unusual diarrhea, constipation, pain near the anus) TENDERNESS IN MOUTH AND THROAT WITH OR WITHOUT PRESENCE OF ULCERS (sore throat, sores in mouth, or a toothache) UNUSUAL RASH, SWELLING OR PAIN  UNUSUAL VAGINAL DISCHARGE OR ITCHING   Items with * indicate a potential emergency and should be followed up as soon as possible or go to the Emergency Department if any problems should occur.  Please show the CHEMOTHERAPY ALERT CARD or IMMUNOTHERAPY ALERT CARD at check-in to the  Emergency Department and triage nurse.  Should you have questions after your visit or need to cancel or reschedule your appointment, please contact [facility]  [phone] and follow the prompts.  Office hours are 8:00 a.m. to 4:30 p.m. Monday - Friday. Please note that voicemails left after 4:00 p.m. may not be returned until the following business day.  We are closed weekends and major holidays. You have access to a nurse at all times for urgent questions. Please call the main number to the clinic [phone] and follow the prompts.  For any non-urgent questions, you may also contact your provider using MyChart. We now offer e-Visits for anyone 18 and older to request care online for non-urgent symptoms. For details visit [email or web address].   Also download the MyChart app! Go to the app store, search "MyChart", open the app, select , and log in with your MyChart username and password.  Due to Covid, a mask is required upon entering the hospital/clinic. If you do not have a mask, one will be given to you upon arrival. For doctor visits, patients may have 1 support person aged 18 or older with them. For treatment visits, patients cannot have anyone with them due to current Covid guidelines and our immunocompromised population.  

## 2021-04-13 NOTE — Progress Notes (Signed)
Vitals  and labs reviewed by MD. Per MD to proceed with treatment today.  Yvonne Scott [employer]

## 2021-04-13 NOTE — Progress Notes (Signed)
Hematology/Oncology progress note [phone] [phone]      Patient Care Team: [name], NP as PCP - General (Nurse Practitioner)  REFERRING PROVIDER: [name], NP  CHIEF COMPLAINTS/REASON FOR VISIT:  Follow-up for triple negative right breast cancer.  HISTORY OF PRESENTING ILLNESS:   Yvonne Scott is a  61 y.o.  female with PMH listed below was seen in consultation at the request of  [name], NP  for evaluation of breast cancer.  August 2022, patient was diagnosed with right breast stage IIIb cT3 N0M0, grade 2, ER negative, PR negative HER2 negative breast cancer.  Patient self palpated breast mass many years ago.  10/21/2020 diagnostic mammogram and ultrasound confirmed presence of mass and a subsequent biopsy revealed right breast triple negative cancer. There was plan for patient to start with neoadjuvant chemotherapy followed by surgery and radiation.  Unfortunately patient never started treatment.  Patient was accompanied by her mother.  They want to transfer her oncology care to locally.  Patient reports having right breast pain as well as generalized pain.  Patient had a [name] placed 3 weeks ago and she reports some swelling around the sites.  Patient has had negative genetic testing at [facility].  Patient has a history of alcohol dependence.  Per patient she has not been drinking recently.  Everyday smoker.   02/24/2021, CT chest abdomen pelvis showed large right breast mass, 5.5 x 4.8 cm with small right axillary lymph node with variable degrees of enhancement potentially involved but not specific based on size.  This tracked down to retropectoral nodal stations largest lymph node along the lateral margin of the pectoralis major.  Tiny pulmonary nodules in the chest as discussed.  These are nonspecific but would consider short interval follow-up to assess for any changes.  No evidence of metastatic disease involving the abdomen or  pelvis.  Aortic atherosclerosis.  02/25/2021, echocardiogram showed LVEF 65 to 70%.  Patient has been to chemotherapy class and understands antiemetics instructions.  03/16/2021, bone scan showed no evidence of osseous metastatic breast cancer, asymmetric osseous uptake on the right at L5-S1, corresponding to degenerative disc disease on recent CT.  Asymmetric soft tissue uptake in the right breast.  INTERVAL HISTORY Yvonne Scott is a 61 y.o. female who has above history reviewed by me today presents for follow up visit for management of triple negative breast cancer, stage IIIb Patient is on neoadjuvant chemotherapy with carboplatin/Taxol/Keytruda. Patient feels more tired after last treatment.  No fever, chills.  No nausea vomiting.  Reports hand and leg cramps.  Denies numbness tingling.  Right breast pain has improved. For the presumed vaginal yeast infection, symptoms have completely resolved per patient.  She did not need to use fluconazole.  Review of Systems  Constitutional:  Positive for fatigue. Negative for appetite change, chills and fever.  HENT:   Negative for hearing loss, mouth sores and voice change.   Eyes:  Negative for eye problems.  Respiratory:  Negative for chest tightness and cough.   Cardiovascular:  Negative for chest pain.  Gastrointestinal:  Negative for abdominal distention, abdominal pain, blood in stool and nausea.  Endocrine: Negative for hot flashes.  Genitourinary:  Negative for difficulty urinating and frequency.   Musculoskeletal:  Negative for arthralgias.  Skin:  Negative for itching and rash.  Neurological:  Negative for extremity weakness.  Hematological:  Negative for adenopathy.  Psychiatric/Behavioral:  Negative for confusion.   Vaginal itchiness  MEDICAL HISTORY:  Past  Medical History:  Diagnosis Date   Anxiety    Aortic atherosclerosis ([place]) 10/24/2016   Breast cancer ([place])    GERD (gastroesophageal reflux disease)     SURGICAL  HISTORY: History reviewed. No pertinent surgical history.  SOCIAL HISTORY: Social History   Socioeconomic History   Marital status: Widowed    Spouse name: Not on file   Number of children: Not on file   Years of education: Not on file   Highest education level: Not on file  Occupational History   Not on file  Tobacco Use   Smoking status: Every Day    Packs/day: 1.00    Years: 36.00    Pack years: 36.00    Types: Cigarettes    Start date: 03/15/1979   Smokeless tobacco: Never  Vaping Use   Vaping Use: Never used  Substance and Sexual Activity   Alcohol use: No    Alcohol/week: 3.0 standard drinks    Types: 3 Standard drinks or equivalent per week   Drug use: Not Currently   Sexual activity: Not Currently    Birth control/protection: None  Other Topics Concern   Not on file  Social History Narrative   Not on file   Social Determinants of Health   Financial Resource Strain: Not on file  Food Insecurity: Not on file  Transportation Needs: Not on file  Physical Activity: Not on file  Stress: Not on file  Social Connections: Not on file  Intimate Partner Violence: Not on file    FAMILY HISTORY: Family History  Problem Relation Age of Onset   Cancer Mother 81       breast   Cancer Father        melonma   Cirrhosis Maternal Grandfather    Cancer Paternal Grandmother        skin/breast    ALLERGIES:  has No Known Allergies.  MEDICATIONS:  Current Outpatient Medications  Medication Sig Dispense Refill   calcium-vitamin D (OSCAL WITH D) 500-5 MG-MCG tablet Take 2 tablets by mouth daily. 60 tablet 5   dexamethasone (DECADRON) 4 MG tablet Take 2 tablets once a day for 2 days after carboplatin and AC chemotherapy. Take with food. 30 tablet 1   fluconazole (DIFLUCAN) 150 MG tablet Take 1 tablet (150 mg total) by mouth See admin instructions. Take one tablet, and repeat the second dose in 3 days. 2 tablet 0   folic acid (FOLVITE) 1 MG tablet Take 1 tablet (1 mg  total) by mouth daily. 30 tablet 0   gabapentin (NEURONTIN) 100 MG capsule Take 1 capsule (100 mg total) by mouth 3 (three) times daily. 30 capsule 0   guaiFENesin (MUCINEX) 600 MG 12 hr tablet Take 2 tablets (1,200 mg total) by mouth 2 (two) times daily. 60 tablet 0   ibuprofen (ADVIL) 200 MG tablet Take 200 mg by mouth every 6 (six) hours as needed for headache.     lidocaine-prilocaine (EMLA) cream Apply to affected area once 30 g 3   magic mouthwash (multi-ingredient) oral suspension Take 5 mLs by mouth 4 (four) times daily as needed for mouth pain. 480 mL 1   magnesium chloride (SLOW-MAG) 64 MG TBEC SR tablet Take 1 tablet (64 mg total) by mouth 2 (two) times daily. 60 tablet 2   Multiple Vitamin (MULTIVITAMIN WITH MINERALS) TABS tablet Take 1 tablet by mouth daily. 30 tablet 0   omeprazole (PRILOSEC) 20 MG capsule Take 20 mg by mouth daily as needed (reflux).  ondansetron (ZOFRAN) 8 MG tablet Take 1 tablet (8 mg total) by mouth 2 (two) times daily as needed. Start on the third day after carboplatin and AC chemotherapy. 30 tablet 1   prochlorperazine (COMPAZINE) 10 MG tablet Take 1 tablet (10 mg total) by mouth every 6 (six) hours as needed (Nausea or vomiting). 30 tablet 1   thiamine 100 MG tablet Take 0.5 tablets (50 mg total) by mouth daily. 15 tablet 0   potassium chloride SA (KLOR-CON M) 20 MEQ tablet Take 1 tablet (20 mEq total) by mouth 2 (two) times daily. 60 tablet 1   No current facility-administered medications for this visit.   Facility-Administered Medications Ordered in Other Visits  Medication Dose Route Frequency Provider Last Rate Last Admin   diphenhydrAMINE (BENADRYL) injection 50 mg  50 mg Intravenous Once Earlie Server, MD       famotidine (PEPCID) IVPB 20 mg premix  20 mg Intravenous Once Earlie Server, MD 200 mL/hr at 04/13/21 0954 20 mg at 04/13/21 0954   heparin lock flush 100 UNIT/ML injection            heparin lock flush 100 unit/mL  500 Units Intracatheter Once PRN Earlie Server, MD       magnesium sulfate IVPB 4 g 100 mL  4 g Intravenous Once Earlie Server, MD 50 mL/hr at 04/13/21 0955 4 g at 04/13/21 0955   PACLitaxel (TAXOL) 120 mg in sodium chloride 0.9 % 250 mL chemo infusion (</= 87m/m2)  80 mg/m2 (Order-Specific) Intravenous Once YEarlie Server MD         PHYSICAL EXAMINATION: ECOG PERFORMANCE STATUS: 1 - Symptomatic but completely ambulatory Vitals:   04/13/21 0851  BP: 112/74  Pulse: (!) 102  Temp: 98.2 F (36.8 C)   Filed Weights   04/13/21 0851  Weight: 110 lb (49.9 kg)    Physical Exam Constitutional:      General: She is not in acute distress. HENT:     Head: Normocephalic and atraumatic.  Eyes:     General: No scleral icterus. Cardiovascular:     Rate and Rhythm: Normal rate and regular rhythm.     Heart sounds: Normal heart sounds.  Pulmonary:     Effort: Pulmonary effort is normal. No respiratory distress.     Breath sounds: No wheezing.  Abdominal:     General: Bowel sounds are normal. There is no distension.     Palpations: Abdomen is soft.  Musculoskeletal:        General: No deformity. Normal range of motion.     Cervical back: Normal range of motion and neck supple.  Skin:    General: Skin is warm and dry.     Findings: No erythema or rash.  Neurological:     Mental Status: She is alert and oriented to person, place, and time. Mental status is at baseline.     Cranial Nerves: No cranial nerve deficit.     Coordination: Coordination normal.  Psychiatric:        Mood and Affect: Mood normal.    LABORATORY DATA:  I have reviewed the data as listed Lab Results  Component Value Date   WBC 7.5 04/13/2021   HGB 9.3 (L) 04/13/2021   HCT 27.4 (L) 04/13/2021   MCV 91.9 04/13/2021   PLT 277 04/13/2021   Recent Labs    03/30/21 0832 04/06/21 0827 04/13/21 0834  NA 133* 132* 132*  K 4.3 3.9 3.0*  CL 101 103 98  CO2 21*  22 23  GLUCOSE 85 92 97  BUN _0 CREATININE 0.79 0.68 0.66  CALCIUM 8.8* 8.8* 8.4*   GFRNONAA >60 >60 >60  PROT 6.3* 6.8 6.5  ALBUMIN 3.7 4.0 3.7  AST 14* 15 20  ALT _1 ALKPHOS 46 60 49  BILITOT 0.2* 0.2* 0.1*    Iron/TIBC/Ferritin/ %Sat    Component Value Date/Time   IRON 80 10/24/2016 1534   TIBC 282 10/24/2016 1534   FERRITIN 282 (H) 11/17/2016 1404   IRONPCTSAT 28 10/24/2016 1534       RADIOGRAPHIC STUDIES: I have personally reviewed the radiological images as listed and agreed with the findings in the report. NM Bone Scan Whole Body  Result Date: 03/16/2021 CLINICAL DATA:  Invasive triple negative ductal carcinoma the right breast. Right breast pain radiating into the arm. EXAM: NUCLEAR MEDICINE WHOLE BODY BONE SCAN TECHNIQUE: Whole body anterior and posterior images were obtained approximately 3 hours after intravenous injection of radiopharmaceutical. RADIOPHARMACEUTICALS:  20.46 mCi Technetium-72mMDP IV COMPARISON:  CTs of the chest, abdomen and pelvis 02/24/2021. No previous bone scan. FINDINGS: There is no osseous uptake highly suspicious for metastatic disease. There is focal increased uptake at L5-S1 on the right, corresponding with degenerative disc disease and endplate sclerosis on previous CT. Asymmetric right mandibular activity is likely periodontal. Asymmetric soft tissue activity is present within the right breast, corresponding with the known right breast mass and/or subsequent therapy. Soft tissue activity otherwise appears unremarkable. IMPRESSION: 1. No evidence of osseous metastatic breast cancer. 2. Asymmetric osseous uptake on the right at L5-S1, corresponding with degenerative disc disease on recent CT. 3. Asymmetric soft tissue uptake in the right breast. Electronically Signed   By: WRichardean SaleM.D.   On: 03/16/2021 12:36   MM Outside Films Mammo  Result Date: 03/17/2021 This examination belongs to an outside facility and is stored here for comparison purposes only.  Contact the originating outside institution for any associated  report or interpretation.  MM Outside Films Mammo  Result Date: 03/17/2021 This examination belongs to an outside facility and is stored here for comparison purposes only.  Contact the originating outside institution for any associated report or interpretation.  MM Outside Films Mammo  Result Date: 03/17/2021 This examination belongs to an outside facility and is stored here for comparison purposes only.  Contact the originating outside institution for any associated report or interpretation.  MM Outside Films Mammo  Result Date: 03/17/2021 This examination belongs to an outside facility and is stored here for comparison purposes only.  Contact the originating outside institution for any associated report or interpretation.  MM Outside Films Mammo  Result Date: 03/17/2021 This examination belongs to an outside facility and is stored here for comparison purposes only.  Contact the originating outside institution for any associated report or interpretation.     ASSESSMENT & PLAN:  1. Invasive [place]al carcinoma of right [place] ([place]   2. Neoplasm related pain   3. Hypomagnesemia   4. Hypokalemia   5. Encounter for antineoplastic chemotherapy    Cancer Staging  Invasive ductal carcinoma of right breast ([facility] Staging form: Breast, AJCC 8th Edition - Clinical stage from 02/19/2021: Stage IIIB (cT3, cN0, cM0, G2, ER-, PR-, HER2-) - Signed by YEarlie Server MD on 02/20/2021  #Clinical stage III triple negative right breast cancer. cT3 cN0-1 Baseline elevated CA 15-3 and CA 27.29. I have ordered repeat mammogram/ultrasound for evaluation of axillary lymph node with future plan of biopsy if  feasible.  Patient finally has mammogram/ultrasound scheduled tomorrow. Meanwhile, patient has been started on chemotherapy treatments and has had clinical good response to the treatment.  Right breast mass is getting smaller. If she still has detectable ultrasound of axilla, I plan to biopsy to confirm if she has  lymph node positive disease.  Labs are reviewed and discussed with patient.  Proceed with cycle 2-day 8 Taxol treatments. Advised patient to utilize dexamethasone for 1 to 2 days post treatments.  #Vaginal itchiness, likely yeast infection secondary to recent antibiotic treatments.  Resolved.  # Nausea, without vomiting Continue home antiemetics.  Stable symptoms.  # Hypokalemia, continue potassium chloride 41mq BID.  Refills sent  # Hypomagnesia, 1.2,  she will receive IV mag sulfate 4g x 1.  Continue Slow-Mag 64 mg to BID.  Patient is aware that she has refills.  #Generalized body ache, right breast pain, negative bone scan. Continue gabapentin.  Continue follow-up with palliative care service. Symptom has improved.   All questions were answered. The patient knows to call the clinic with any problems questions or concerns.  cc BEvern BioH, NP    Return of visit:   Follow  up in 1 week for lab md carboplatin/Keytruda Taxol   ZEarlie Server MD, PhD  04/13/2021

## 2021-04-14 ENCOUNTER — Ambulatory Visit
Admission: RE | Admit: 2021-04-14 | Discharge: 2021-04-14 | Disposition: A | Discharge status: Home / Self Care | Payer: Medicaid Other | Source: Ambulatory Visit | Attending: Oncology | Admitting: Oncology

## 2021-04-14 DIAGNOSIS — C50911 Malignant neoplasm of unspecified site of right female breast: Secondary | ICD-10-CM

## 2021-04-19 ENCOUNTER — Other Ambulatory Visit: Payer: Self-pay | Admitting: Oncology

## 2021-04-19 DIAGNOSIS — R928 Other abnormal and inconclusive findings on diagnostic imaging of breast: Secondary | ICD-10-CM

## 2021-04-20 ENCOUNTER — Telehealth: Payer: Self-pay

## 2021-04-20 ENCOUNTER — Other Ambulatory Visit: Payer: Self-pay

## 2021-04-20 ENCOUNTER — Inpatient Hospital Stay: Payer: Medicaid Other

## 2021-04-20 ENCOUNTER — Inpatient Hospital Stay: Payer: Medicaid Other | Attending: Oncology

## 2021-04-20 ENCOUNTER — Encounter: Payer: Self-pay | Admitting: Oncology

## 2021-04-20 ENCOUNTER — Inpatient Hospital Stay (HOSPITAL_BASED_OUTPATIENT_CLINIC_OR_DEPARTMENT_OTHER): Payer: Medicaid Other | Admitting: Oncology

## 2021-04-20 VITALS — BP 116/72 | HR 84 | Temp 97.8°F | Resp 18 | Ht 64.0 in | Wt 109.9 lb

## 2021-04-20 DIAGNOSIS — Z5189 Encounter for other specified aftercare: Secondary | ICD-10-CM | POA: Diagnosis not present

## 2021-04-20 DIAGNOSIS — Z79899 Other long term (current) drug therapy: Secondary | ICD-10-CM | POA: Insufficient documentation

## 2021-04-20 DIAGNOSIS — C50911 Malignant neoplasm of unspecified site of right female breast: Secondary | ICD-10-CM | POA: Diagnosis not present

## 2021-04-20 DIAGNOSIS — Z171 Estrogen receptor negative status [ER-]: Secondary | ICD-10-CM | POA: Diagnosis not present

## 2021-04-20 DIAGNOSIS — Z5112 Encounter for antineoplastic immunotherapy: Secondary | ICD-10-CM | POA: Insufficient documentation

## 2021-04-20 DIAGNOSIS — E876 Hypokalemia: Secondary | ICD-10-CM | POA: Diagnosis not present

## 2021-04-20 DIAGNOSIS — F1021 Alcohol dependence, in remission: Secondary | ICD-10-CM | POA: Diagnosis not present

## 2021-04-20 DIAGNOSIS — Z5111 Encounter for antineoplastic chemotherapy: Secondary | ICD-10-CM | POA: Insufficient documentation

## 2021-04-20 DIAGNOSIS — C50411 Malignant neoplasm of upper-outer quadrant of right female breast: Secondary | ICD-10-CM | POA: Diagnosis not present

## 2021-04-20 DIAGNOSIS — F17210 Nicotine dependence, cigarettes, uncomplicated: Secondary | ICD-10-CM | POA: Diagnosis not present

## 2021-04-20 LAB — CBC WITH DIFFERENTIAL/PLATELET
Abs Immature Granulocytes: 0.06 10*3/uL (ref 0.00–0.07)
Basophils Absolute: 0 10*3/uL (ref 0.0–0.1)
Basophils Relative: 0 %
Eosinophils Absolute: 0 10*3/uL (ref 0.0–0.5)
Eosinophils Relative: 0 %
HCT: 26.6 % — ABNORMAL LOW (ref 36.0–46.0)
Hemoglobin: 9.1 g/dL — ABNORMAL LOW (ref 12.0–15.0)
Immature Granulocytes: 1 %
Lymphocytes Relative: 78 %
Lymphs Abs: 5.7 10*3/uL — ABNORMAL HIGH (ref 0.7–4.0)
MCH: 31 pg (ref 26.0–34.0)
MCHC: 34.2 g/dL (ref 30.0–36.0)
MCV: 90.5 fL (ref 80.0–100.0)
Monocytes Absolute: 0.3 10*3/uL (ref 0.1–1.0)
Monocytes Relative: 5 %
Neutro Abs: 1.2 10*3/uL — ABNORMAL LOW (ref 1.7–7.7)
Neutrophils Relative %: 16 %
Platelets: 199 10*3/uL (ref 150–400)
RBC: 2.94 MIL/uL — ABNORMAL LOW (ref 3.87–5.11)
RDW: 14.8 % (ref 11.5–15.5)
Smear Review: NORMAL
WBC: 7.2 10*3/uL (ref 4.0–10.5)
nRBC: 0.3 % — ABNORMAL HIGH (ref 0.0–0.2)

## 2021-04-20 LAB — COMPREHENSIVE METABOLIC PANEL
ALT: 13 U/L (ref 0–44)
AST: 14 U/L — ABNORMAL LOW (ref 15–41)
Albumin: 3.5 g/dL (ref 3.5–5.0)
Alkaline Phosphatase: 40 U/L (ref 38–126)
Anion gap: 6 (ref 5–15)
BUN: 10 mg/dL (ref 8–23)
CO2: 23 mmol/L (ref 22–32)
Calcium: 7.7 mg/dL — ABNORMAL LOW (ref 8.9–10.3)
Chloride: 95 mmol/L — ABNORMAL LOW (ref 98–111)
Creatinine, Ser: 0.63 mg/dL (ref 0.44–1.00)
GFR, Estimated: >60 mL/min (ref >60)
Glucose, Bld: 83 mg/dL (ref 70–99)
Potassium: 3.5 mmol/L (ref 3.5–5.1)
Sodium: 124 mmol/L — ABNORMAL LOW (ref 135–145)
Total Bilirubin: 0.2 mg/dL — ABNORMAL LOW (ref 0.3–1.2)
Total Protein: 6 g/dL — ABNORMAL LOW (ref 6.5–8.1)

## 2021-04-20 LAB — MAGNESIUM: Magnesium: 1.2 mg/dL — ABNORMAL LOW (ref 1.7–2.4)

## 2021-04-20 MED ORDER — SODIUM CHLORIDE 0.9% FLUSH
10.0000 mL | INTRAVENOUS | Status: DC | PRN
  Administered 2021-04-20: 10 mL
  Filled 2021-04-20: qty 10

## 2021-04-20 MED ORDER — SODIUM CHLORIDE 0.9 % IV SOLN
10.0000 mg | Freq: Once | INTRAVENOUS | Status: AC
  Administered 2021-04-20: 10 mg via INTRAVENOUS
  Filled 2021-04-20: qty 10

## 2021-04-20 MED ORDER — HEPARIN SOD (PORK) LOCK FLUSH 100 UNIT/ML IV SOLN
500.0000 [IU] | Freq: Once | INTRAVENOUS | Status: AC | PRN
  Filled 2021-04-20: qty 5

## 2021-04-20 MED ORDER — SODIUM CHLORIDE 0.9 % IV SOLN
Freq: Once | INTRAVENOUS | Status: AC
  Administered 2021-04-20: 1000 mL via INTRAVENOUS
  Filled 2021-04-20: qty 250

## 2021-04-20 MED ORDER — SODIUM CHLORIDE 0.9 % IV SOLN
Freq: Once | INTRAVENOUS | Status: AC
  Filled 2021-04-20: qty 250

## 2021-04-20 MED ORDER — SODIUM CHLORIDE 0.9 % IV SOLN
80.0000 mg/m2 | Freq: Once | INTRAVENOUS | Status: AC
  Administered 2021-04-20: 120 mg via INTRAVENOUS
  Filled 2021-04-20: qty 20

## 2021-04-20 MED ORDER — HEPARIN SOD (PORK) LOCK FLUSH 100 UNIT/ML IV SOLN
INTRAVENOUS | Status: AC
  Administered 2021-04-20: 500 [IU]
  Filled 2021-04-20: qty 5

## 2021-04-20 MED ORDER — FAMOTIDINE IN NACL 20-0.9 MG/50ML-% IV SOLN
20.0000 mg | Freq: Once | INTRAVENOUS | Status: AC
  Administered 2021-04-20: 20 mg via INTRAVENOUS
  Filled 2021-04-20: qty 50

## 2021-04-20 MED ORDER — DIPHENHYDRAMINE HCL 50 MG/ML IJ SOLN
50.0000 mg | Freq: Once | INTRAMUSCULAR | Status: AC
  Administered 2021-04-20: 50 mg via INTRAVENOUS
  Filled 2021-04-20: qty 1

## 2021-04-20 MED ORDER — MAGNESIUM SULFATE 4 GM/100ML IV SOLN
4.0000 g | Freq: Once | INTRAVENOUS | Status: AC
  Administered 2021-04-20: 4 g via INTRAVENOUS
  Filled 2021-04-20: qty 100

## 2021-04-20 MED ORDER — SODIUM CHLORIDE 0.9 % IV SOLN
2.0000 g | Freq: Once | INTRAVENOUS | Status: AC
  Administered 2021-04-20: 2 g via INTRAVENOUS
  Filled 2021-04-20: qty 20

## 2021-04-20 NOTE — Telephone Encounter (Signed)
Samantha @ [facility] has entered order and will arrange appt for pt.

## 2021-04-20 NOTE — Patient Instructions (Signed)
[facility] AT [place]   Discharge Instructions: Thank you for choosing [facility] to provide your oncology and hematology care.  If you have a lab appointment with the [facility], please go directly to the [facility] and check in at the registration area.   Wear comfortable clothing and clothing appropriate for easy access to any Portacath or PICC line.   We strive to give you quality time with your provider. You may need to reschedule your appointment if you arrive late (15 or more minutes).  Arriving late affects you and other patients whose appointments are after yours.  Also, if you miss three or more appointments without notifying the office, you may be dismissed from the clinic at the providers discretion.      For prescription refill requests, have your pharmacy contact our office and allow 72 hours for refills to be completed.    Today you received the following chemotherapy and/or immunotherapy agents: Taxol. Today you also received the following: 0.9% Sodium Chloride, Calcium Gluconate, and Magnesium Sulfate.      To help prevent nausea and vomiting after your treatment, we encourage you to take your nausea medication as directed.  BELOW ARE SYMPTOMS THAT SHOULD BE REPORTED IMMEDIATELY: *FEVER GREATER THAN 100.4 F (38 C) OR HIGHER *CHILLS OR SWEATING *NAUSEA AND VOMITING THAT IS NOT CONTROLLED WITH YOUR NAUSEA MEDICATION *UNUSUAL SHORTNESS OF BREATH *UNUSUAL BRUISING OR BLEEDING *URINARY PROBLEMS (pain or burning when urinating, or frequent urination) *BOWEL PROBLEMS (unusual diarrhea, constipation, pain near the anus) TENDERNESS IN MOUTH AND THROAT WITH OR WITHOUT PRESENCE OF ULCERS (sore throat, sores in mouth, or a toothache) UNUSUAL RASH, SWELLING OR PAIN  UNUSUAL VAGINAL DISCHARGE OR ITCHING   Items with * indicate a potential emergency and should be followed up as soon as possible or go to the Emergency Department if any  problems should occur.  Please show the CHEMOTHERAPY ALERT CARD or IMMUNOTHERAPY ALERT CARD at check-in to the Emergency Department and triage nurse.  Should you have questions after your visit or need to cancel or reschedule your appointment, please contact [facility] AT [place]  Dept: [phone]  and follow the prompts.  Office hours are 8:00 a.m. to 4:30 p.m. Monday - Friday. Please note that voicemails left after 4:00 p.m. may not be returned until the following business day.  We are closed weekends and major holidays. You have access to a nurse at all times for urgent questions. Please call the main number to the clinic Dept: [phone] and follow the prompts.  For any non-urgent questions, you may also contact your provider using MyChart. We now offer e-Visits for anyone 62 and older to request care online for non-urgent symptoms. For details visit [email or web address].   Also download the MyChart app! Go to the app store, search "MyChart", open the app, select [facility], and log in with your MyChart username and password.  Due to Covid, a mask is required upon entering the hospital/clinic. If you do not have a mask, one will be given to you upon arrival. For doctor visits, patients may have 1 support person aged 14 or older with them. For treatment visits, patients cannot have anyone with them due to current Covid guidelines and our immunocompromised population.

## 2021-04-20 NOTE — Progress Notes (Signed)
Nutrition Follow-up:  Patient with triple negative breast cancer.  Patient receiving chemotherapy.   Met with patient during infusion.  Reports that appetite has not been good.  Patient having taste alterations and decreased appetite.  Reports that she drank a chocolate boost shake this am and it tasted good.  Ate peanut butter crackers prior to RD seeing her and those tasted good.  Says her legs feel weak.      Medications: reviewed  Labs: reviewed  Anthropometrics:   Weight 109 lb 14.4 oz  109 lb 8 oz on 1/10 138 lb on 02/25/2020   NUTRITION DIAGNOSIS:  Inadequate oral intake continues    INTERVENTION:  Discussed strategies to help with taste changes.  Handout provided Discussed ways to add calories and protein in diet.  Discussed that cancer center has OT that can evaluate her for weakness.  She wants to hold off on seeing OT at this time Coupons and samples of ensure plus and ensure complete given to patient today    MONITORING, EVALUATION, GOAL: weight trends, intake   NEXT VISIT: to be determined with treatment  Yvonne Scott B. Zenia Resides, [place], [place] Registered Dietitian [phone] (mobile)

## 2021-04-20 NOTE — Telephone Encounter (Signed)
-----   Message from Earlie Server, MD sent at 04/18/2021  1:57 PM EST ----- Please arrange patient to have US guided right axillary lymph node biopsy. Thanks.

## 2021-04-20 NOTE — Progress Notes (Signed)
Hematology/Oncology progress note [phone] [phone]      Patient Care Team: [name], NP as PCP - General (Nurse Practitioner)  REFERRING PROVIDER: [name], NP  CHIEF COMPLAINTS/REASON FOR VISIT:  Follow-up for triple negative right breast cancer.  HISTORY OF PRESENTING ILLNESS:   Yvonne Scott is a  61 y.o.  female with PMH listed below was seen in consultation at the request of  [name], NP  for evaluation of breast cancer.  August 2022, patient was diagnosed with right breast stage IIIb cT3 N0M0, grade 2, ER negative, PR negative HER2 negative breast cancer.  Patient self palpated breast mass many years ago.  10/21/2020 diagnostic mammogram and ultrasound confirmed presence of mass and a subsequent biopsy revealed right breast triple negative cancer. There was plan for patient to start with neoadjuvant chemotherapy followed by surgery and radiation.  Unfortunately patient never started treatment.  Patient was accompanied by her mother.  They want to transfer her oncology care to locally.  Patient reports having right breast pain as well as generalized pain.  Patient had a [name] placed 3 weeks ago and she reports some swelling around the sites.  Patient has had negative genetic testing at [facility].  Patient has a history of alcohol dependence.  Per patient she has not been drinking recently.  Everyday smoker.   02/24/2021, CT chest abdomen pelvis showed large right breast mass, 5.5 x 4.8 cm with small right axillary lymph node with variable degrees of enhancement potentially involved but not specific based on size.  This tracked down to retropectoral nodal stations largest lymph node along the lateral margin of the pectoralis major.  Tiny pulmonary nodules in the chest as discussed.  These are nonspecific but would consider short interval follow-up to assess for any changes.  No evidence of metastatic disease involving the abdomen or  pelvis.  Aortic atherosclerosis.  02/25/2021, echocardiogram showed LVEF 65 to 70%.  Patient has been to chemotherapy class and understands antiemetics instructions.  03/16/2021, bone scan showed no evidence of osseous metastatic breast cancer, asymmetric osseous uptake on the right at L5-S1, corresponding to degenerative disc disease on recent CT.  Asymmetric soft tissue uptake in the right breast.  INTERVAL HISTORY Yvonne Scott is a 61 y.o. female who has above history reviewed by me today presents for follow up visit for management of triple negative breast cancer, stage IIIb Patient is on neoadjuvant chemotherapy with carboplatin/Taxol/Keytruda. Overall she is doing well.  Tolerates treatment She has noticed cracks on her fingertips, some tenderness of the fingertips.  No much numbness tingling.  She says she keep herself busy, wash her hands multiple times during the day when she does her chores. Appetite is fair. Denies nausea vomiting diarrhea.  Review of Systems  Constitutional:  Positive for fatigue. Negative for appetite change, chills and fever.  HENT:   Negative for hearing loss, mouth sores and voice change.   Eyes:  Negative for eye problems.  Respiratory:  Negative for chest tightness and cough.   Cardiovascular:  Negative for chest pain.  Gastrointestinal:  Negative for abdominal distention, abdominal pain, blood in stool and nausea.  Endocrine: Negative for hot flashes.  Genitourinary:  Negative for difficulty urinating and frequency.   Musculoskeletal:  Negative for arthralgias.  Skin:  Negative for itching and rash.  Neurological:  Negative for extremity weakness.  Hematological:  Negative for adenopathy.  Psychiatric/Behavioral:  Negative for confusion.   Fingertip cracks  MEDICAL HISTORY:  Past Medical History:  Diagnosis Date   Anxiety    Aortic atherosclerosis ([place]) 10/24/2016   Breast cancer ([place])    GERD (gastroesophageal reflux disease)     SURGICAL  HISTORY: Past Surgical History:  Procedure Laterality Date   BREAST BIOPSY      SOCIAL HISTORY: Social History   Socioeconomic History   Marital status: Widowed    Spouse name: Not on file   Number of children: Not on file   Years of education: Not on file   Highest education level: Not on file  Occupational History   Not on file  Tobacco Use   Smoking status: Every Day    Packs/day: 1.00    Years: 36.00    Pack years: 36.00    Types: Cigarettes    Start date: 03/15/1979   Smokeless tobacco: Never  Vaping Use   Vaping Use: Never used  Substance and Sexual Activity   Alcohol use: No    Alcohol/week: 3.0 standard drinks    Types: 3 Standard drinks or equivalent per week   Drug use: Not Currently   Sexual activity: Not Currently    Birth control/protection: None  Other Topics Concern   Not on file  Social History Narrative   Not on file   Social Determinants of Health   Financial Resource Strain: Not on file  Food Insecurity: Not on file  Transportation Needs: Not on file  Physical Activity: Not on file  Stress: Not on file  Social Connections: Not on file  Intimate Partner Violence: Not on file    FAMILY HISTORY: Family History  Problem Relation Age of Onset   Cancer Mother 67       breast   Cancer Father        melonma   Cirrhosis Maternal Grandfather    Cancer Paternal Grandmother        skin/breast    ALLERGIES:  has No Known Allergies.  MEDICATIONS:  Current Outpatient Medications  Medication Sig Dispense Refill   calcium-vitamin D (OSCAL WITH D) 500-5 MG-MCG tablet Take 2 tablets by mouth daily. 60 tablet 5   dexamethasone (DECADRON) 4 MG tablet Take 2 tablets once a day for 2 days after carboplatin and AC chemotherapy. Take with food. 30 tablet 1   fluconazole (DIFLUCAN) 150 MG tablet Take 1 tablet (150 mg total) by mouth See admin instructions. Take one tablet, and repeat the second dose in 3 days. 2 tablet 0   folic acid (FOLVITE) 1 MG tablet  Take 1 tablet (1 mg total) by mouth daily. 30 tablet 0   gabapentin (NEURONTIN) 100 MG capsule Take 1 capsule (100 mg total) by mouth 3 (three) times daily. 30 capsule 0   guaiFENesin (MUCINEX) 600 MG 12 hr tablet Take 2 tablets (1,200 mg total) by mouth 2 (two) times daily. 60 tablet 0   ibuprofen (ADVIL) 200 MG tablet Take 200 mg by mouth every 6 (six) hours as needed for headache.     lidocaine-prilocaine (EMLA) cream Apply to affected area once 30 g 3   magic mouthwash (multi-ingredient) oral suspension Take 5 mLs by mouth 4 (four) times daily as needed for mouth pain. 480 mL 1   magnesium chloride (SLOW-MAG) 64 MG TBEC SR tablet Take 1 tablet (64 mg total) by mouth 2 (two) times daily. 60 tablet 2   Multiple Vitamin (MULTIVITAMIN WITH MINERALS) TABS tablet Take 1 tablet by mouth daily. 30 tablet 0   omeprazole (PRILOSEC) 20 MG capsule Take 20  mg by mouth daily as needed (reflux).     ondansetron (ZOFRAN) 8 MG tablet Take 1 tablet (8 mg total) by mouth 2 (two) times daily as needed. Start on the third day after carboplatin and AC chemotherapy. 30 tablet 1   potassium chloride SA (KLOR-CON M) 20 MEQ tablet Take 1 tablet (20 mEq total) by mouth 2 (two) times daily. 60 tablet 1   prochlorperazine (COMPAZINE) 10 MG tablet Take 1 tablet (10 mg total) by mouth every 6 (six) hours as needed (Nausea or vomiting). 30 tablet 1   thiamine 100 MG tablet Take 0.5 tablets (50 mg total) by mouth daily. 15 tablet 0   No current facility-administered medications for this visit.   Facility-Administered Medications Ordered in Other Visits  Medication Dose Route Frequency Provider Last Rate Last Admin   calcium gluconate 2 g in sodium chloride 0.9 % 100 mL IVPB  2 g Intravenous Once Earlie Server, MD 60 mL/hr at 04/20/21 1059 2 g at 04/20/21 1059   heparin lock flush 100 UNIT/ML injection            heparin lock flush 100 unit/mL  500 Units Intracatheter Once PRN Earlie Server, MD       magnesium sulfate IVPB 4 g 100 mL   4 g Intravenous Once Earlie Server, MD 50 mL/hr at 04/20/21 1102 4 g at 04/20/21 1102   sodium chloride flush (NS) 0.9 % injection 10 mL  10 mL Intracatheter PRN Earlie Server, MD   10 mL at 04/20/21 0930     PHYSICAL EXAMINATION: ECOG PERFORMANCE STATUS: 1 - Symptomatic but completely ambulatory Vitals:   04/20/21 0832  BP: 116/72  Pulse: 84  Resp: 18  Temp: 97.8 F (36.6 C)  SpO2: 99%   Filed Weights   04/20/21 0832  Weight: 109 lb 14.4 oz (49.9 kg)    Physical Exam Constitutional:      General: She is not in acute distress. HENT:     Head: Normocephalic and atraumatic.  Eyes:     General: No scleral icterus. Cardiovascular:     Rate and Rhythm: Normal rate and regular rhythm.     Heart sounds: Normal heart sounds.  Pulmonary:     Effort: Pulmonary effort is normal. No respiratory distress.     Breath sounds: No wheezing.  Abdominal:     General: Bowel sounds are normal. There is no distension.     Palpations: Abdomen is soft.  Musculoskeletal:        General: No deformity. Normal range of motion.     Cervical back: Normal range of motion and neck supple.  Skin:    General: Skin is warm and dry.     Findings: No erythema or rash.  Neurological:     Mental Status: She is alert and oriented to person, place, and time. Mental status is at baseline.     Cranial Nerves: No cranial nerve deficit.     Coordination: Coordination normal.  Psychiatric:        Mood and Affect: Mood normal.    LABORATORY DATA:  I have reviewed the data as listed Lab Results  Component Value Date   WBC 7.2 04/20/2021   HGB 9.1 (L) 04/20/2021   HCT 26.6 (L) 04/20/2021   MCV 90.5 04/20/2021   PLT 199 04/20/2021   Recent Labs    04/06/21 0827 04/13/21 0834 04/20/21 0806  NA 132* 132* 124*  K 3.9 3.0* 3.5  CL 103 98 95*  CO2  22 23 23   GLUCOSE 92 97 83  BUN 15 14 10   CREATININE 0.68 0.66 0.63  CALCIUM 8.8* 8.4* 7.7*  GFRNONAA >60 >60 >60  PROT 6.8 6.5 6.0*  ALBUMIN 4.0 3.7 3.5  AST  15 20 14*  ALT 11 10 13   ALKPHOS 60 49 40  BILITOT 0.2* 0.1* 0.2*    Iron/TIBC/Ferritin/ %Sat    Component Value Date/Time   IRON 80 10/24/2016 1534   TIBC 282 10/24/2016 1534   FERRITIN 282 (H) 11/17/2016 1404   IRONPCTSAT 28 10/24/2016 1534       RADIOGRAPHIC STUDIES: I have personally reviewed the radiological images as listed and agreed with the findings in the report. US BREAST LTD UNI RIGHT INC AXILLA  Result Date: 04/14/2021 CLINICAL DATA:  Known right breast cancer. Recent chest CT demonstrated possible right axillary lymphadenopathy. EXAM: DIGITAL DIAGNOSTIC UNILATERAL RIGHT MAMMOGRAM WITH TOMOSYNTHESIS AND CAD; ULTRASOUND RIGHT BREAST LIMITED TECHNIQUE: Right digital diagnostic mammography and breast tomosynthesis was performed. The images were evaluated with computer-aided detection.; Targeted ultrasound examination of the right breast was performed COMPARISON:  Previous exam(s). ACR Breast Density Category c: The breast tissue is heterogeneously dense, which may obscure small masses. FINDINGS: Mammographically, there is a large hyperdense mass in the right outer central breast measuring 4.9 x 4.8 x 5.4 cm mammographically. Post biopsy marker is located within this mass. Prominent spiculations from the mass extent to the pectoralis muscle. There is at least 1 enlarged right axillary lymph node. Targeted right breast ultrasound demonstrates 11 o'clock 4 cm from nipple large solid mass with central necrosis measuring 4.4 x 5.1 x 3.5 cm. Previous measurements on 10/21/2020 [facility] are 5.2 x 4.3 x 4.9 cm. There is 1 borderline abnormal lymph node in the right axilla with cortical thickening of 3 mm. IMPRESSION: Mostly stable in size but with prominence internal necrosis right breast malignancy. One borderline abnormal right axillary lymph node. RECOMMENDATION: Continue with plan of care for known right breast cancer. I have discussed the findings and recommendations  with the patient. If applicable, a reminder letter will be sent to the patient regarding the next appointment. BI-RADS CATEGORY  6: Known biopsy-proven malignancy. Electronically Signed   By: Fidela Salisbury M.D.   On: 04/14/2021 10:06  MM DIAG BREAST TOMO UNI RIGHT  Result Date: 04/14/2021 CLINICAL DATA:  Known right breast cancer. Recent chest CT demonstrated possible right axillary lymphadenopathy. EXAM: DIGITAL DIAGNOSTIC UNILATERAL RIGHT MAMMOGRAM WITH TOMOSYNTHESIS AND CAD; ULTRASOUND RIGHT BREAST LIMITED TECHNIQUE: Right digital diagnostic mammography and breast tomosynthesis was performed. The images were evaluated with computer-aided detection.; Targeted ultrasound examination of the right breast was performed COMPARISON:  Previous exam(s). ACR Breast Density Category c: The breast tissue is heterogeneously dense, which may obscure small masses. FINDINGS: Mammographically, there is a large hyperdense mass in the right outer central breast measuring 4.9 x 4.8 x 5.4 cm mammographically. Post biopsy marker is located within this mass. Prominent spiculations from the mass extent to the pectoralis muscle. There is at least 1 enlarged right axillary lymph node. Targeted right breast ultrasound demonstrates 11 o'clock 4 cm from nipple large solid mass with central necrosis measuring 4.4 x 5.1 x 3.5 cm. Previous measurements on 10/21/2020 from the [facility] are 5.2 x 4.3 x 4.9 cm. There is 1 borderline abnormal lymph node in the right axilla with cortical thickening of 3 mm. IMPRESSION: Mostly stable in size but with prominence internal necrosis right breast malignancy. One borderline abnormal  right axillary lymph node. RECOMMENDATION: Continue with plan of care for known right breast cancer. I have discussed the findings and recommendations with the patient. If applicable, a reminder letter will be sent to the patient regarding the next appointment. BI-RADS CATEGORY  6: Known biopsy-proven  malignancy. Electronically Signed   By: Fidela Salisbury M.D.   On: 04/14/2021 10:06     ASSESSMENT & PLAN:  1. Invasive ductal carcinoma of right breast ([place])   2. Hypomagnesemia   3. Encounter for antineoplastic chemotherapy   4. Hypokalemia    Cancer Staging  Invasive ductal carcinoma of right breast [facility]) Staging form: Breast, AJCC 8th Edition - Clinical stage from 02/19/2021: Stage IIIB (cT3, cN0, cM0, G2, ER-, PR-, HER2-) - Signed by Earlie Server, MD on 02/20/2021  #Clinical stage III triple negative right breast cancer. cT3 cN0-1 Baseline elevated CA 15-3 and CA 27.29. Right diagnostic mammogram and ultrasound were done and the patient does have a small right axillary lymph node.-Plan to biopsy. Labs reviewed and discussed with patient for Proceed with cycle 2-day 15 Taxol treatment. I recommend patient to keep her hands warm, wear kitchen glove when she does have stroke, avoid multiple hand washes, apply utterly cream. Patient will receive day 16 and day 17 short acting G-CSF.  # Nausea, without vomiting Continue home antiemetics.  Stable symptoms.  # Hypokalemia, continue potassium chloride 26mq BID.  Potassium is stable.  # Hypomagnesia, 1.2,  she will receive IV mag sulfate 4g x 1.  Continue Slow-Mag 64 mg twice daily..Marland Kitchen #Generalized body ache, right breast pain, negative bone scan. Continue gabapentin.  Continue follow-up with palliative care service. Symptom has improved.   All questions were answered. The patient knows to call the clinic with any problems questions or concerns.  cc BEvern BioH, NP    Return of visit:   Follow  up in 1 week for lab md carboplatin/Keytruda Taxol   ZEarlie Server MD, PhD  04/20/2021

## 2021-04-21 ENCOUNTER — Inpatient Hospital Stay: Payer: Medicaid Other

## 2021-04-21 DIAGNOSIS — C50911 Malignant neoplasm of unspecified site of right female breast: Secondary | ICD-10-CM

## 2021-04-21 DIAGNOSIS — Z5112 Encounter for antineoplastic immunotherapy: Secondary | ICD-10-CM | POA: Diagnosis not present

## 2021-04-21 MED ORDER — FILGRASTIM-AAFI 300 MCG/0.5ML IJ SOSY
300.0000 ug | PREFILLED_SYRINGE | Freq: Once | INTRAMUSCULAR | Status: AC
  Administered 2021-04-21: 300 ug via SUBCUTANEOUS
  Filled 2021-04-21: qty 0.5

## 2021-04-22 ENCOUNTER — Inpatient Hospital Stay: Payer: Medicaid Other

## 2021-04-22 ENCOUNTER — Other Ambulatory Visit: Payer: Medicaid Other

## 2021-04-22 ENCOUNTER — Ambulatory Visit: Payer: Medicaid Other

## 2021-04-27 ENCOUNTER — Encounter: Payer: Self-pay | Admitting: Oncology

## 2021-04-27 ENCOUNTER — Telehealth: Payer: Self-pay

## 2021-04-27 ENCOUNTER — Inpatient Hospital Stay: Payer: Medicaid Other

## 2021-04-27 ENCOUNTER — Other Ambulatory Visit: Payer: Self-pay

## 2021-04-27 ENCOUNTER — Inpatient Hospital Stay (HOSPITAL_BASED_OUTPATIENT_CLINIC_OR_DEPARTMENT_OTHER): Payer: Medicaid Other | Admitting: Oncology

## 2021-04-27 VITALS — BP 136/84 | HR 112 | Temp 98.5°F | Wt 108.0 lb

## 2021-04-27 DIAGNOSIS — E876 Hypokalemia: Secondary | ICD-10-CM

## 2021-04-27 DIAGNOSIS — C50911 Malignant neoplasm of unspecified site of right female breast: Secondary | ICD-10-CM

## 2021-04-27 DIAGNOSIS — Z5111 Encounter for antineoplastic chemotherapy: Secondary | ICD-10-CM

## 2021-04-27 DIAGNOSIS — R059 Cough, unspecified: Secondary | ICD-10-CM

## 2021-04-27 DIAGNOSIS — Z5112 Encounter for antineoplastic immunotherapy: Secondary | ICD-10-CM | POA: Diagnosis not present

## 2021-04-27 LAB — CBC WITH DIFFERENTIAL/PLATELET
Abs Immature Granulocytes: 0.66 10*3/uL — ABNORMAL HIGH (ref 0.00–0.07)
Basophils Absolute: 0 10*3/uL (ref 0.0–0.1)
Basophils Relative: 0 %
Eosinophils Absolute: 0 10*3/uL (ref 0.0–0.5)
Eosinophils Relative: 0 %
HCT: 28.5 % — ABNORMAL LOW (ref 36.0–46.0)
Hemoglobin: 9.7 g/dL — ABNORMAL LOW (ref 12.0–15.0)
Immature Granulocytes: 6 %
Lymphocytes Relative: 57 %
Lymphs Abs: 6.7 10*3/uL — ABNORMAL HIGH (ref 0.7–4.0)
MCH: 31.9 pg (ref 26.0–34.0)
MCHC: 34 g/dL (ref 30.0–36.0)
MCV: 93.8 fL (ref 80.0–100.0)
Monocytes Absolute: 0.5 10*3/uL (ref 0.1–1.0)
Monocytes Relative: 4 %
Neutro Abs: 3.9 10*3/uL (ref 1.7–7.7)
Neutrophils Relative %: 33 %
Platelets: 184 10*3/uL (ref 150–400)
RBC: 3.04 MIL/uL — ABNORMAL LOW (ref 3.87–5.11)
RDW: 17.8 % — ABNORMAL HIGH (ref 11.5–15.5)
Smear Review: NORMAL
WBC: 11.8 10*3/uL — ABNORMAL HIGH (ref 4.0–10.5)
nRBC: 0.8 % — ABNORMAL HIGH (ref 0.0–0.2)

## 2021-04-27 LAB — COMPREHENSIVE METABOLIC PANEL
ALT: 13 U/L (ref 0–44)
AST: 19 U/L (ref 15–41)
Albumin: 3.7 g/dL (ref 3.5–5.0)
Alkaline Phosphatase: 50 U/L (ref 38–126)
Anion gap: 8 (ref 5–15)
BUN: 10 mg/dL (ref 8–23)
CO2: 25 mmol/L (ref 22–32)
Calcium: 8.1 mg/dL — ABNORMAL LOW (ref 8.9–10.3)
Chloride: 96 mmol/L — ABNORMAL LOW (ref 98–111)
Creatinine, Ser: 0.67 mg/dL (ref 0.44–1.00)
GFR, Estimated: >60 mL/min (ref >60)
Glucose, Bld: 116 mg/dL — ABNORMAL HIGH (ref 70–99)
Potassium: 3 mmol/L — ABNORMAL LOW (ref 3.5–5.1)
Sodium: 129 mmol/L — ABNORMAL LOW (ref 135–145)
Total Bilirubin: <0.1 mg/dL — ABNORMAL LOW (ref 0.3–1.2)
Total Protein: 6.1 g/dL — ABNORMAL LOW (ref 6.5–8.1)

## 2021-04-27 LAB — MAGNESIUM: Magnesium: 1.1 mg/dL — ABNORMAL LOW (ref 1.7–2.4)

## 2021-04-27 MED ORDER — HEPARIN SOD (PORK) LOCK FLUSH 100 UNIT/ML IV SOLN
500.0000 [IU] | Freq: Once | INTRAVENOUS | Status: AC
  Administered 2021-04-27: 500 [IU] via INTRAVENOUS
  Filled 2021-04-27: qty 5

## 2021-04-27 MED ORDER — MAGNESIUM SULFATE 4 GM/100ML IV SOLN
4.0000 g | Freq: Once | INTRAVENOUS | Status: AC
  Administered 2021-04-27: 4 g via INTRAVENOUS
  Filled 2021-04-27: qty 100

## 2021-04-27 MED ORDER — SODIUM CHLORIDE 0.9 % IV SOLN: 6.0000 g | Freq: Once | INTRAVENOUS | Status: DC

## 2021-04-27 MED ORDER — SODIUM CHLORIDE 0.9 % IV SOLN
Freq: Once | INTRAVENOUS | Status: AC
  Filled 2021-04-27: qty 250

## 2021-04-27 MED ORDER — POTASSIUM CHLORIDE 20 MEQ/100ML IV SOLN
20.0000 meq | Freq: Once | INTRAVENOUS | Status: AC
  Administered 2021-04-27: 20 meq via INTRAVENOUS

## 2021-04-27 MED ORDER — MAGNESIUM SULFATE 2 GM/50ML IV SOLN
2.0000 g | Freq: Once | INTRAVENOUS | Status: AC
  Administered 2021-04-27: 2 g via INTRAVENOUS
  Filled 2021-04-27: qty 50

## 2021-04-27 NOTE — Progress Notes (Signed)
Hematology/Oncology Progress note [phone] [phone]         Patient Care Team: [name], NP as PCP - General (Nurse Practitioner)  REFERRING PROVIDER: [name], NP  CHIEF COMPLAINTS/REASON FOR VISIT:  Follow-up for triple negative right breast cancer.  HISTORY OF PRESENTING ILLNESS:   Yvonne Scott is a  61 y.o.  female with PMH listed below was seen in consultation at the request of  [name], NP  for evaluation of breast cancer.  August 2022, patient was diagnosed with right breast stage IIIb cT3 N0M0, grade 2, ER negative, PR negative HER2 negative breast cancer.  Patient self palpated breast mass many years ago.  10/21/2020 diagnostic mammogram and ultrasound confirmed presence of mass and a subsequent biopsy revealed right breast triple negative cancer. There was plan for patient to start with neoadjuvant chemotherapy followed by surgery and radiation.  Unfortunately patient never started treatment.  Patient was accompanied by her mother.  They want to transfer her oncology care to locally.  Patient reports having right breast pain as well as generalized pain.  Patient had a [name] placed 3 weeks ago and she reports some swelling around the sites.  Patient has had negative genetic testing at [facility].  Patient has a history of alcohol dependence.  Per patient she has not been drinking recently.  Everyday smoker.   02/24/2021, CT chest abdomen pelvis showed large right breast mass, 5.5 x 4.8 cm with small right axillary lymph node with variable degrees of enhancement potentially involved but not specific based on size.  This tracked down to retropectoral nodal stations largest lymph node along the lateral margin of the pectoralis major.  Tiny pulmonary nodules in the chest as discussed.  These are nonspecific but would consider short interval follow-up to assess for any changes.  No evidence of metastatic disease involving the  abdomen or pelvis.  Aortic atherosclerosis.  02/25/2021, echocardiogram showed LVEF 65 to 70%.  Patient has been to chemotherapy class and understands antiemetics instructions.  03/16/2021, bone scan showed no evidence of osseous metastatic breast cancer, asymmetric osseous uptake on the right at L5-S1, corresponding to degenerative disc disease on recent CT.  Asymmetric soft tissue uptake in the right breast.  INTERVAL HISTORY Yvonne Scott is a 61 y.o. female who has above history reviewed by me today presents for follow up visit for management of triple negative breast cancer, stage IIIb Patient is on neoadjuvant chemotherapy with carboplatin/Taxol/Keytruda. Patient's brother passed away recently.  Patient is in grieving. + Nasal congestion.  Cough no fever or chills.  Review of Systems  Constitutional:  Positive for fatigue. Negative for appetite change, chills and fever.  HENT:   Negative for hearing loss, mouth sores and voice change.   Eyes:  Negative for eye problems.  Respiratory:  Positive for cough. Negative for chest tightness.   Cardiovascular:  Negative for chest pain.  Gastrointestinal:  Negative for abdominal distention, abdominal pain, blood in stool and nausea.  Endocrine: Negative for hot flashes.  Genitourinary:  Negative for difficulty urinating and frequency.   Musculoskeletal:  Negative for arthralgias.  Skin:  Negative for itching and rash.  Neurological:  Negative for extremity weakness.  Hematological:  Negative for adenopathy.  Psychiatric/Behavioral:  Negative for confusion.     MEDICAL HISTORY:  Past Medical History:  Diagnosis Date   Anxiety    Aortic atherosclerosis ([place]) 10/24/2016   Breast cancer ([place])    GERD (gastroesophageal reflux disease)  SURGICAL HISTORY: Past Surgical History:  Procedure Laterality Date   BREAST BIOPSY      SOCIAL HISTORY: Social History   Socioeconomic History   Marital status: Widowed    Spouse name: Not  on file   Number of children: Not on file   Years of education: Not on file   Highest education level: Not on file  Occupational History   Not on file  Tobacco Use   Smoking status: Every Day    Packs/day: 1.00    Years: 36.00    Pack years: 36.00    Types: Cigarettes    Start date: 03/15/1979   Smokeless tobacco: Never  Vaping Use   Vaping Use: Never used  Substance and Sexual Activity   Alcohol use: No    Alcohol/week: 3.0 standard drinks    Types: 3 Standard drinks or equivalent per week   Drug use: Not Currently   Sexual activity: Not Currently    Birth control/protection: None  Other Topics Concern   Not on file  Social History Narrative   Not on file   Social Determinants of Health   Financial Resource Strain: Not on file  Food Insecurity: Not on file  Transportation Needs: Not on file  Physical Activity: Not on file  Stress: Not on file  Social Connections: Not on file  Intimate Partner Violence: Not on file    FAMILY HISTORY: Family History  Problem Relation Age of Onset   Cancer Mother 16       breast   Cancer Father        melonma   Cirrhosis Maternal Grandfather    Cancer Paternal Grandmother        skin/breast    ALLERGIES:  has No Known Allergies.  MEDICATIONS:  Current Outpatient Medications  Medication Sig Dispense Refill   calcium-vitamin D (OSCAL WITH D) 500-5 MG-MCG tablet Take 2 tablets by mouth daily. 60 tablet 5   dexamethasone (DECADRON) 4 MG tablet Take 2 tablets once a day for 2 days after carboplatin and AC chemotherapy. Take with food. 30 tablet 1   fluconazole (DIFLUCAN) 150 MG tablet Take 1 tablet (150 mg total) by mouth See admin instructions. Take one tablet, and repeat the second dose in 3 days. 2 tablet 0   folic acid (FOLVITE) 1 MG tablet Take 1 tablet (1 mg total) by mouth daily. 30 tablet 0   gabapentin (NEURONTIN) 100 MG capsule Take 1 capsule (100 mg total) by mouth 3 (three) times daily. 30 capsule 0   guaiFENesin  (MUCINEX) 600 MG 12 hr tablet Take 2 tablets (1,200 mg total) by mouth 2 (two) times daily. 60 tablet 0   ibuprofen (ADVIL) 200 MG tablet Take 200 mg by mouth every 6 (six) hours as needed for headache.     lidocaine-prilocaine (EMLA) cream Apply to affected area once 30 g 3   magic mouthwash (multi-ingredient) oral suspension Take 5 mLs by mouth 4 (four) times daily as needed for mouth pain. 480 mL 1   magnesium chloride (SLOW-MAG) 64 MG TBEC SR tablet Take 1 tablet (64 mg total) by mouth 2 (two) times daily. 60 tablet 2   Multiple Vitamin (MULTIVITAMIN WITH MINERALS) TABS tablet Take 1 tablet by mouth daily. 30 tablet 0   omeprazole (PRILOSEC) 20 MG capsule Take 20 mg by mouth daily as needed (reflux).     ondansetron (ZOFRAN) 8 MG tablet Take 1 tablet (8 mg total) by mouth 2 (two) times daily as needed. Start on  the third day after carboplatin and AC chemotherapy. 30 tablet 1   potassium chloride SA (KLOR-CON M) 20 MEQ tablet Take 1 tablet (20 mEq total) by mouth 2 (two) times daily. 60 tablet 1   prochlorperazine (COMPAZINE) 10 MG tablet Take 1 tablet (10 mg total) by mouth every 6 (six) hours as needed (Nausea or vomiting). 30 tablet 1   thiamine 100 MG tablet Take 0.5 tablets (50 mg total) by mouth daily. 15 tablet 0   No current facility-administered medications for this visit.     PHYSICAL EXAMINATION: ECOG PERFORMANCE STATUS: 1 - Symptomatic but completely ambulatory Vitals:   04/27/21 0849  BP: 136/84  Pulse: (!) 112  Temp: 98.5 F (36.9 C)   Filed Weights   04/27/21 0849  Weight: 108 lb (49 kg)    Physical Exam Constitutional:      General: She is not in acute distress. HENT:     Head: Normocephalic and atraumatic.  Eyes:     General: No scleral icterus. Cardiovascular:     Rate and Rhythm: Normal rate and regular rhythm.     Heart sounds: Normal heart sounds.  Pulmonary:     Effort: Pulmonary effort is normal. No respiratory distress.     Breath sounds: Wheezing  present.  Abdominal:     General: Bowel sounds are normal. There is no distension.     Palpations: Abdomen is soft.  Musculoskeletal:        General: No deformity. Normal range of motion.     Cervical back: Normal range of motion and neck supple.  Skin:    General: Skin is warm and dry.     Findings: No erythema or rash.  Neurological:     Mental Status: She is alert and oriented to person, place, and time. Mental status is at baseline.     Cranial Nerves: No cranial nerve deficit.     Coordination: Coordination normal.  Psychiatric:        Mood and Affect: Mood normal.    LABORATORY DATA:  I have reviewed the data as listed Lab Results  Component Value Date   WBC 11.8 (H) 04/27/2021   HGB 9.7 (L) 04/27/2021   HCT 28.5 (L) 04/27/2021   MCV 93.8 04/27/2021   PLT 184 04/27/2021   Recent Labs    04/13/21 0834 04/20/21 0806 04/27/21 0833  NA 132* 124* 129*  K 3.0* 3.5 3.0*  CL 98 95* 96*  CO2 '23 23 25  ' GLUCOSE 97 83 116*  BUN '14 10 10  ' CREATININE 0.66 0.63 0.67  CALCIUM 8.4* 7.7* 8.1*  GFRNONAA >60 >60 >60  PROT 6.5 6.0* 6.1*  ALBUMIN 3.7 3.5 3.7  AST 20 14* 19  ALT '10 13 13  ' ALKPHOS 49 40 50  BILITOT 0.1* 0.2* <0.1*    Iron/TIBC/Ferritin/ %Sat    Component Value Date/Time   IRON 80 10/24/2016 1534   TIBC 282 10/24/2016 1534   FERRITIN 282 (H) 11/17/2016 1404   IRONPCTSAT 28 10/24/2016 1534       RADIOGRAPHIC STUDIES: I have personally reviewed the radiological images as listed and agreed with the findings in the report. US BREAST LTD UNI RIGHT INC AXILLA  Result Date: 04/14/2021 CLINICAL DATA:  Known right breast cancer. Recent chest CT demonstrated possible right axillary lymphadenopathy. EXAM: DIGITAL DIAGNOSTIC UNILATERAL RIGHT MAMMOGRAM WITH TOMOSYNTHESIS AND CAD; ULTRASOUND RIGHT BREAST LIMITED TECHNIQUE: Right digital diagnostic mammography and breast tomosynthesis was performed. The images were evaluated with computer-aided detection.; Targeted  ultrasound examination of the right breast was performed COMPARISON:  Previous exam(s). ACR Breast Density Category c: The breast tissue is heterogeneously dense, which may obscure small masses. FINDINGS: Mammographically, there is a large hyperdense mass in the right outer central breast measuring 4.9 x 4.8 x 5.4 cm mammographically. Post biopsy marker is located within this mass. Prominent spiculations from the mass extent to the pectoralis muscle. There is at least 1 enlarged right axillary lymph node. Targeted right breast ultrasound demonstrates 11 o'clock 4 cm from nipple large solid mass with central necrosis measuring 4.4 x 5.1 x 3.5 cm. Previous measurements on 10/21/2020 from the [facility] are 5.2 x 4.3 x 4.9 cm. There is 1 borderline abnormal lymph node in the right axilla with cortical thickening of 3 mm. IMPRESSION: Mostly stable in size but with prominence internal necrosis right breast malignancy. One borderline abnormal right axillary lymph node. RECOMMENDATION: Continue with plan of care for known right breast cancer. I have discussed the findings and recommendations with the patient. If applicable, a reminder letter will be sent to the patient regarding the next appointment. BI-RADS CATEGORY  6: Known biopsy-proven malignancy. Electronically Signed   By: Fidela Salisbury M.D.   On: 04/14/2021 10:06  MM DIAG BREAST TOMO UNI RIGHT  Result Date: 04/14/2021 CLINICAL DATA:  Known right breast cancer. Recent chest CT demonstrated possible right axillary lymphadenopathy. EXAM: DIGITAL DIAGNOSTIC UNILATERAL RIGHT MAMMOGRAM WITH TOMOSYNTHESIS AND CAD; ULTRASOUND RIGHT BREAST LIMITED TECHNIQUE: Right digital diagnostic mammography and breast tomosynthesis was performed. The images were evaluated with computer-aided detection.; Targeted ultrasound examination of the right breast was performed COMPARISON:  Previous exam(s). ACR Breast Density Category c: The breast tissue is heterogeneously  dense, which may obscure small masses. FINDINGS: Mammographically, there is a large hyperdense mass in the right outer central breast measuring 4.9 x 4.8 x 5.4 cm mammographically. Post biopsy marker is located within this mass. Prominent spiculations from the mass extent to the pectoralis muscle. There is at least 1 enlarged right axillary lymph node. Targeted right breast ultrasound demonstrates 11 o'clock 4 cm from nipple large solid mass with central necrosis measuring 4.4 x 5.1 x 3.5 cm. Previous measurements on 10/21/2020 from the [facility] are 5.2 x 4.3 x 4.9 cm. There is 1 borderline abnormal lymph node in the right axilla with cortical thickening of 3 mm. IMPRESSION: Mostly stable in size but with prominence internal necrosis right breast malignancy. One borderline abnormal right axillary lymph node. RECOMMENDATION: Continue with plan of care for known right breast cancer. I have discussed the findings and recommendations with the patient. If applicable, a reminder letter will be sent to the patient regarding the next appointment. BI-RADS CATEGORY  6: Known biopsy-proven malignancy. Electronically Signed   By: Fidela Salisbury M.D.   On: 04/14/2021 10:06     ASSESSMENT & PLAN:  1. Invasive ductal carcinoma of right breast ([place])   2. Encounter for antineoplastic chemotherapy   3. Hypomagnesemia   4. Hypocalcemia   5. Hypokalemia   6. Cough, unspecified type    Cancer Staging  Invasive ductal carcinoma of right breast [facility]) Staging form: Breast, AJCC 8th Edition - Clinical stage from 02/19/2021: Stage IIIB (cT3, cN0, cM0, G2, ER-, PR-, HER2-) - Signed by Earlie Server, MD on 02/20/2021  #Clinical stage III triple negative right breast cancer. cT3 cN0-1 Baseline elevated CA 15-3 and CA 27.29. Right diagnostic mammogram and ultrasound were done and the patient does have a small right axillary lymph  node.-Recommend biopsy. Patient is tachycardic, congested today.  Recommend patient to  have COVID-19 tested. Hold off chemotherapy treatments.  Recommend chest x-ray  # Hypokalemia, continue potassium chloride 33mq BID.  IV potassium chloride 266m x1..  # Hypomagnesia, 1.1,  she will receive IV mag sulfate 6g x 1.  Continue Slow-Mag 64 mg twice daily.. Marland Kitchen#Generalized body ache, right breast pain, negative bone scan. Continue gabapentin.  Continue follow-up with palliative care service. Symptom has improved.     All questions were answered. The patient knows to call the clinic with any problems questions or concerns.  cc BoEvern Bio, NP    Return of visit:   Follow  up in 1 week for lab md carboplatin/Keytruda Taxol   ZhEarlie ServerMD, PhD  04/27/2021

## 2021-04-27 NOTE — Telephone Encounter (Signed)
Attempts to contact pt regarding scheduling breast biopsy requested by Dr Tasia Catchings.   Called pt on 02/07 left message with the person that answered phone. 2nd call to pt on 02/08 left voicemail. Called pt again on 02/14, left message with person who answered phone. So far, pt has not called back.

## 2021-04-28 ENCOUNTER — Other Ambulatory Visit: Payer: Self-pay | Admitting: Oncology

## 2021-04-28 ENCOUNTER — Inpatient Hospital Stay: Payer: Medicaid Other

## 2021-04-28 DIAGNOSIS — C50911 Malignant neoplasm of unspecified site of right female breast: Secondary | ICD-10-CM

## 2021-04-29 ENCOUNTER — Ambulatory Visit
Admission: RE | Admit: 2021-04-29 | Discharge: 2021-04-29 | Disposition: A | Discharge status: Home / Self Care | Payer: Medicaid Other | Source: Ambulatory Visit | Attending: Oncology | Admitting: Oncology

## 2021-04-29 ENCOUNTER — Ambulatory Visit
Admission: RE | Admit: 2021-04-29 | Discharge: 2021-04-29 | Disposition: A | Discharge status: Home / Self Care | Payer: Medicaid Other | Attending: Oncology | Admitting: Oncology

## 2021-04-29 ENCOUNTER — Inpatient Hospital Stay: Payer: Medicaid Other

## 2021-04-29 DIAGNOSIS — R059 Cough, unspecified: Secondary | ICD-10-CM | POA: Insufficient documentation

## 2021-05-04 ENCOUNTER — Other Ambulatory Visit: Payer: Self-pay

## 2021-05-04 ENCOUNTER — Other Ambulatory Visit: Payer: Self-pay | Admitting: Hospice and Palliative Medicine

## 2021-05-04 ENCOUNTER — Inpatient Hospital Stay: Payer: Medicaid Other

## 2021-05-04 ENCOUNTER — Inpatient Hospital Stay (HOSPITAL_BASED_OUTPATIENT_CLINIC_OR_DEPARTMENT_OTHER): Payer: Medicaid Other | Admitting: Hospice and Palliative Medicine

## 2021-05-04 ENCOUNTER — Encounter: Payer: Self-pay | Admitting: Oncology

## 2021-05-04 ENCOUNTER — Ambulatory Visit: Payer: Medicaid Other

## 2021-05-04 ENCOUNTER — Inpatient Hospital Stay (HOSPITAL_BASED_OUTPATIENT_CLINIC_OR_DEPARTMENT_OTHER): Payer: Medicaid Other | Admitting: Oncology

## 2021-05-04 VITALS — BP 110/68 | HR 97 | Temp 97.6°F | Wt 109.0 lb

## 2021-05-04 DIAGNOSIS — E876 Hypokalemia: Secondary | ICD-10-CM

## 2021-05-04 DIAGNOSIS — C50911 Malignant neoplasm of unspecified site of right female breast: Secondary | ICD-10-CM | POA: Diagnosis not present

## 2021-05-04 DIAGNOSIS — G893 Neoplasm related pain (acute) (chronic): Secondary | ICD-10-CM

## 2021-05-04 DIAGNOSIS — Z5111 Encounter for antineoplastic chemotherapy: Secondary | ICD-10-CM

## 2021-05-04 DIAGNOSIS — R52 Pain, unspecified: Secondary | ICD-10-CM

## 2021-05-04 DIAGNOSIS — Z5112 Encounter for antineoplastic immunotherapy: Secondary | ICD-10-CM | POA: Diagnosis not present

## 2021-05-04 LAB — COMPREHENSIVE METABOLIC PANEL
ALT: 10 U/L (ref 0–44)
AST: 13 U/L — ABNORMAL LOW (ref 15–41)
Albumin: 3.3 g/dL — ABNORMAL LOW (ref 3.5–5.0)
Alkaline Phosphatase: 51 U/L (ref 38–126)
Anion gap: 8 (ref 5–15)
BUN: 7 mg/dL — ABNORMAL LOW (ref 8–23)
CO2: 28 mmol/L (ref 22–32)
Calcium: 8.5 mg/dL — ABNORMAL LOW (ref 8.9–10.3)
Chloride: 94 mmol/L — ABNORMAL LOW (ref 98–111)
Creatinine, Ser: 0.56 mg/dL (ref 0.44–1.00)
GFR, Estimated: >60 mL/min (ref >60)
Glucose, Bld: 85 mg/dL (ref 70–99)
Potassium: 3.6 mmol/L (ref 3.5–5.1)
Sodium: 130 mmol/L — ABNORMAL LOW (ref 135–145)
Total Bilirubin: 0.3 mg/dL (ref 0.3–1.2)
Total Protein: 6.4 g/dL — ABNORMAL LOW (ref 6.5–8.1)

## 2021-05-04 LAB — CBC WITH DIFFERENTIAL/PLATELET
Abs Immature Granulocytes: 0.09 10*3/uL — ABNORMAL HIGH (ref 0.00–0.07)
Basophils Absolute: 0 10*3/uL (ref 0.0–0.1)
Basophils Relative: 1 %
Eosinophils Absolute: 0 10*3/uL (ref 0.0–0.5)
Eosinophils Relative: 0 %
HCT: 25.8 % — ABNORMAL LOW (ref 36.0–46.0)
Hemoglobin: 8.8 g/dL — ABNORMAL LOW (ref 12.0–15.0)
Immature Granulocytes: 1 %
Lymphocytes Relative: 47 %
Lymphs Abs: 3.5 10*3/uL (ref 0.7–4.0)
MCH: 32.6 pg (ref 26.0–34.0)
MCHC: 34.1 g/dL (ref 30.0–36.0)
MCV: 95.6 fL (ref 80.0–100.0)
Monocytes Absolute: 0.5 10*3/uL (ref 0.1–1.0)
Monocytes Relative: 7 %
Neutro Abs: 3.4 10*3/uL (ref 1.7–7.7)
Neutrophils Relative %: 44 %
Platelets: 302 10*3/uL (ref 150–400)
RBC: 2.7 MIL/uL — ABNORMAL LOW (ref 3.87–5.11)
RDW: 20.1 % — ABNORMAL HIGH (ref 11.5–15.5)
WBC: 7.6 10*3/uL (ref 4.0–10.5)
nRBC: 0 % (ref 0.0–0.2)

## 2021-05-04 LAB — TSH: TSH: 4.843 u[IU]/mL — ABNORMAL HIGH (ref 0.350–4.500)

## 2021-05-04 LAB — T4, FREE: Free T4: 1.23 ng/dL — ABNORMAL HIGH (ref 0.61–1.12)

## 2021-05-04 LAB — MAGNESIUM: Magnesium: 1.3 mg/dL — ABNORMAL LOW (ref 1.7–2.4)

## 2021-05-04 MED ORDER — SODIUM CHLORIDE 0.9 % IV SOLN
200.0000 mg | Freq: Once | INTRAVENOUS | Status: AC
  Administered 2021-05-04: 200 mg via INTRAVENOUS
  Filled 2021-05-04: qty 200

## 2021-05-04 MED ORDER — SODIUM CHLORIDE 0.9 % IV SOLN
416.5000 mg | Freq: Once | INTRAVENOUS | Status: AC
  Administered 2021-05-04: 420 mg via INTRAVENOUS
  Filled 2021-05-04: qty 42

## 2021-05-04 MED ORDER — DEXAMETHASONE 4 MG PO TABS: ORAL_TABLET | ORAL | 0 refills | Status: DC

## 2021-05-04 MED ORDER — DIPHENHYDRAMINE HCL 50 MG/ML IJ SOLN
50.0000 mg | Freq: Once | INTRAMUSCULAR | Status: AC
  Administered 2021-05-04: 50 mg via INTRAVENOUS
  Filled 2021-05-04: qty 1

## 2021-05-04 MED ORDER — SODIUM CHLORIDE 0.9 % IV SOLN
Freq: Once | INTRAVENOUS | Status: AC
  Filled 2021-05-04: qty 250

## 2021-05-04 MED ORDER — GABAPENTIN 100 MG PO CAPS: 100.0000 mg | ORAL_CAPSULE | Freq: Every day | ORAL | 0 refills | Status: DC

## 2021-05-04 MED ORDER — SODIUM CHLORIDE 0.9 % IV SOLN
150.0000 mg | Freq: Once | INTRAVENOUS | Status: AC
  Administered 2021-05-04: 150 mg via INTRAVENOUS
  Filled 2021-05-04: qty 150

## 2021-05-04 MED ORDER — GABAPENTIN 100 MG PO CAPS: 100.0000 mg | ORAL_CAPSULE | Freq: Three times a day (TID) | ORAL | 0 refills | Status: DC

## 2021-05-04 MED ORDER — HEPARIN SOD (PORK) LOCK FLUSH 100 UNIT/ML IV SOLN
500.0000 [IU] | Freq: Once | INTRAVENOUS | Status: AC | PRN
  Administered 2021-05-04: 500 [IU]
  Filled 2021-05-04: qty 5

## 2021-05-04 MED ORDER — SODIUM CHLORIDE 0.9 % IV SOLN
80.0000 mg/m2 | Freq: Once | INTRAVENOUS | Status: AC
  Administered 2021-05-04: 120 mg via INTRAVENOUS
  Filled 2021-05-04: qty 20

## 2021-05-04 MED ORDER — PALONOSETRON HCL INJECTION 0.25 MG/5ML
0.2500 mg | Freq: Once | INTRAVENOUS | Status: AC
  Administered 2021-05-04: 0.25 mg via INTRAVENOUS
  Filled 2021-05-04: qty 5

## 2021-05-04 MED ORDER — MAGNESIUM SULFATE 4 GM/100ML IV SOLN
4.0000 g | Freq: Once | INTRAVENOUS | Status: AC
  Administered 2021-05-04: 4 g via INTRAVENOUS
  Filled 2021-05-04: qty 100

## 2021-05-04 MED ORDER — SODIUM CHLORIDE 0.9 % IV SOLN
10.0000 mg | Freq: Once | INTRAVENOUS | Status: AC
  Administered 2021-05-04: 10 mg via INTRAVENOUS
  Filled 2021-05-04: qty 10

## 2021-05-04 MED ORDER — FAMOTIDINE IN NACL 20-0.9 MG/50ML-% IV SOLN
20.0000 mg | Freq: Once | INTRAVENOUS | Status: AC
  Administered 2021-05-04: 20 mg via INTRAVENOUS
  Filled 2021-05-04: qty 50

## 2021-05-04 NOTE — Progress Notes (Signed)
Hematology/Oncology Progress note [phone] [phone]         Patient Care Team: [name], NP as PCP - General (Nurse Practitioner)  REFERRING PROVIDER: [name], NP  CHIEF COMPLAINTS/REASON FOR VISIT:  Follow-up for triple negative right breast cancer.  HISTORY OF PRESENTING ILLNESS:   Yvonne Scott is a  61 y.o.  female with PMH listed below was seen in consultation at the request of  [name], NP  for evaluation of breast cancer.  August 2022, patient was diagnosed with right breast stage IIIb cT3 N0M0, grade 2, ER negative, PR negative HER2 negative breast cancer.  Patient self palpated breast mass many years ago.  10/21/2020 diagnostic mammogram and ultrasound confirmed presence of mass and a subsequent biopsy revealed right breast triple negative cancer. There was plan for patient to start with neoadjuvant chemotherapy followed by surgery and radiation.  Unfortunately patient never started treatment.  Patient was accompanied by her mother.  They want to transfer her oncology care to locally.  Patient reports having right breast pain as well as generalized pain.  Patient had a [name] placed 3 weeks ago and she reports some swelling around the sites.  Patient has had negative genetic testing at [facility].  Patient has a history of alcohol dependence.  Per patient she has not been drinking recently.  Everyday smoker.   02/24/2021, CT chest abdomen pelvis showed large right breast mass, 5.5 x 4.8 cm with small right axillary lymph node with variable degrees of enhancement potentially involved but not specific based on size.  This tracked down to retropectoral nodal stations largest lymph node along the lateral margin of the pectoralis major.  Tiny pulmonary nodules in the chest as discussed.  These are nonspecific but would consider short interval follow-up to assess for any changes.  No evidence of metastatic disease involving the  abdomen or pelvis.  Aortic atherosclerosis.  02/25/2021, echocardiogram showed LVEF 65 to 70%.  Patient has been to chemotherapy class and understands antiemetics instructions.  03/16/2021, bone scan showed no evidence of osseous metastatic breast cancer, asymmetric osseous uptake on the right at L5-S1, corresponding to degenerative disc disease on recent CT.  Asymmetric soft tissue uptake in the right breast.  INTERVAL HISTORY Yvonne Scott is a 61 y.o. female who has above history reviewed by me today presents for follow up visit for management of triple negative breast cancer, stage IIIb Patient is on neoadjuvant chemotherapy with carboplatin/Taxol/Keytruda. Last week, chemotherapy was held due to nasal congestion, and cough. X-ray showed no pneumonia.  Patient did not get COVID-19 tested as instructed.  Today she denies fever, chills, nasal congestion has improved.  Chronic cough at baseline. Patient reports minimal numbness tingling symptoms.  Review of Systems  Constitutional:  Positive for fatigue. Negative for appetite change, chills and fever.  HENT:   Negative for hearing loss, mouth sores and voice change.   Eyes:  Negative for eye problems.  Respiratory:  Positive for cough. Negative for chest tightness.   Cardiovascular:  Negative for chest pain.  Gastrointestinal:  Negative for abdominal distention, abdominal pain, blood in stool and nausea.  Endocrine: Negative for hot flashes.  Genitourinary:  Negative for difficulty urinating and frequency.   Musculoskeletal:  Negative for arthralgias.  Skin:  Negative for itching and rash.  Neurological:  Negative for extremity weakness.  Hematological:  Negative for adenopathy.  Psychiatric/Behavioral:  Negative for confusion.     MEDICAL HISTORY:  Past Medical History:  Diagnosis Date   Anxiety    Aortic atherosclerosis ([place]) 10/24/2016   Breast cancer ([place])    GERD (gastroesophageal reflux disease)     SURGICAL  HISTORY: Past Surgical History:  Procedure Laterality Date   BREAST BIOPSY      SOCIAL HISTORY: Social History   Socioeconomic History   Marital status: Widowed    Spouse name: Not on file   Number of children: Not on file   Years of education: Not on file   Highest education level: Not on file  Occupational History   Not on file  Tobacco Use   Smoking status: Every Day    Packs/day: 1.00    Years: 36.00    Pack years: 36.00    Types: Cigarettes    Start date: 03/15/1979   Smokeless tobacco: Never  Vaping Use   Vaping Use: Never used  Substance and Sexual Activity   Alcohol use: No    Alcohol/week: 3.0 standard drinks    Types: 3 Standard drinks or equivalent per week   Drug use: Not Currently   Sexual activity: Not Currently    Birth control/protection: None  Other Topics Concern   Not on file  Social History Narrative   ** Merged History Encounter **       Social Determinants of Health   Financial Resource Strain: Not on file  Food Insecurity: Not on file  Transportation Needs: Not on file  Physical Activity: Not on file  Stress: Not on file  Social Connections: Not on file  Intimate Partner Violence: Not on file    FAMILY HISTORY: Family History  Problem Relation Age of Onset   Cancer Mother 12       breast   Cancer Father        melonma   Cirrhosis Maternal Grandfather    Cancer Paternal Grandmother        skin/breast    ALLERGIES:  has no allergies on file.  MEDICATIONS:  Current Outpatient Medications  Medication Sig Dispense Refill   calcium-vitamin D (OSCAL WITH D) 500-5 MG-MCG tablet Take 2 tablets by mouth daily. 60 tablet 5   dexamethasone (DECADRON) 4 MG tablet TAKE 2 TABLETS BY MOUTH ONCE DAILY FOR 2 DAYS AFTER  CARBOPLATIN  AND  AC  CHEMOTHERAPY.  TAKE  WITH  FOOD. 30 tablet 0   fluconazole (DIFLUCAN) 150 MG tablet Take 1 tablet (150 mg total) by mouth See admin instructions. Take one tablet, and repeat the second dose in 3 days. 2  tablet 0   folic acid (FOLVITE) 1 MG tablet Take 1 tablet (1 mg total) by mouth daily. 30 tablet 0   gabapentin (NEURONTIN) 100 MG capsule Take 1 capsule (100 mg total) by mouth 3 (three) times daily. 30 capsule 0   guaiFENesin (MUCINEX) 600 MG 12 hr tablet Take 2 tablets (1,200 mg total) by mouth 2 (two) times daily. 60 tablet 0   ibuprofen (ADVIL) 200 MG tablet Take 200 mg by mouth every 6 (six) hours as needed for headache.     lidocaine-prilocaine (EMLA) cream Apply to affected area once 30 g 3   magic mouthwash (multi-ingredient) oral suspension Take 5 mLs by mouth 4 (four) times daily as needed for mouth pain. 480 mL 1   magnesium chloride (SLOW-MAG) 64 MG TBEC SR tablet Take 1 tablet (64 mg total) by mouth 2 (two) times daily. 60 tablet 2   Multiple Vitamin (MULTIVITAMIN WITH MINERALS) TABS tablet Take 1 tablet by mouth daily. [place]  tablet 0   omeprazole (PRILOSEC) 20 MG capsule Take 20 mg by mouth daily as needed (reflux).     ondansetron (ZOFRAN) 8 MG tablet Take 1 tablet (8 mg total) by mouth 2 (two) times daily as needed. Start on the third day after carboplatin and AC chemotherapy. 30 tablet 1   potassium chloride SA (KLOR-CON M) 20 MEQ tablet Take 1 tablet (20 mEq total) by mouth 2 (two) times daily. 60 tablet 1   prochlorperazine (COMPAZINE) 10 MG tablet Take 1 tablet (10 mg total) by mouth every 6 (six) hours as needed (Nausea or vomiting). 30 tablet 1   thiamine 100 MG tablet Take 0.5 tablets (50 mg total) by mouth daily. 15 tablet 0   No current facility-administered medications for this visit.     PHYSICAL EXAMINATION: ECOG PERFORMANCE STATUS: 1 - Symptomatic but completely ambulatory There were no vitals filed for this visit.  There were no vitals filed for this visit.   Physical Exam Constitutional:      General: She is not in acute distress. HENT:     Head: Normocephalic and atraumatic.  Eyes:     General: No scleral icterus. Cardiovascular:     Rate and Rhythm:  Normal rate and regular rhythm.     Heart sounds: Normal heart sounds.  Pulmonary:     Effort: Pulmonary effort is normal. No respiratory distress.     Breath sounds: Wheezing present.  Abdominal:     General: Bowel sounds are normal. There is no distension.     Palpations: Abdomen is soft.  Musculoskeletal:        General: No deformity. Normal range of motion.     Cervical back: Normal range of motion and neck supple.  Skin:    General: Skin is warm and dry.     Findings: No erythema or rash.  Neurological:     Mental Status: She is alert and oriented to person, place, and time. Mental status is at baseline.     Cranial Nerves: No cranial nerve deficit.     Coordination: Coordination normal.  Psychiatric:        Mood and Affect: Mood normal.    LABORATORY DATA:  I have reviewed the data as listed Lab Results  Component Value Date   WBC 7.6 05/04/2021   HGB 8.8 (L) 05/04/2021   HCT 25.8 (L) 05/04/2021   MCV 95.6 05/04/2021   PLT 302 05/04/2021   Recent Labs    04/13/21 0834 04/20/21 0806 04/27/21 0833  NA 132* 124* 129*  K 3.0* 3.5 3.0*  CL 98 95* 96*  CO2 '23 23 25  ' GLUCOSE 97 83 116*  BUN '14 10 10  ' CREATININE 0.66 0.63 0.67  CALCIUM 8.4* 7.7* 8.1*  GFRNONAA >60 >60 >60  PROT 6.5 6.0* 6.1*  ALBUMIN 3.7 3.5 3.7  AST 20 14* 19  ALT '10 13 13  ' ALKPHOS 49 40 50  BILITOT 0.1* 0.2* <0.1*    Iron/TIBC/Ferritin/ %Sat    Component Value Date/Time   IRON 80 10/24/2016 1534   TIBC 282 10/24/2016 1534   FERRITIN 282 (H) 11/17/2016 1404   IRONPCTSAT 28 10/24/2016 1534       RADIOGRAPHIC STUDIES: I have personally reviewed the radiological images as listed and agreed with the findings in the report. DG Chest 2 View  Result Date: 04/30/2021 CLINICAL DATA:  Cough and congestion, history of known right breast cancer EXAM: CHEST - 2 VIEW COMPARISON:  02/22/2020, 02/24/2021 FINDINGS: Normal heart size  and vascularity. No focal pneumonia, collapse or consolidation.  Negative for edema, effusion or pneumothorax. Trachea midline. Left IJ power port catheter tip SVC RA junction. No acute or significant osseous finding. Slight scoliosis of the spine as before and degenerative changes. Nonobstructive bowel gas pattern. IMPRESSION: Stable chest exam.  No active disease by plain radiography. Electronically Signed   By: Jerilynn Mages.  Shick M.D.   On: 04/30/2021 11:56   US BREAST LTD UNI RIGHT INC AXILLA  Result Date: 04/14/2021 CLINICAL DATA:  Known right breast cancer. Recent chest CT demonstrated possible right axillary lymphadenopathy. EXAM: DIGITAL DIAGNOSTIC UNILATERAL RIGHT MAMMOGRAM WITH TOMOSYNTHESIS AND CAD; ULTRASOUND RIGHT BREAST LIMITED TECHNIQUE: Right digital diagnostic mammography and breast tomosynthesis was performed. The images were evaluated with computer-aided detection.; Targeted ultrasound examination of the right breast was performed COMPARISON:  Previous exam(s). ACR Breast Density Category c: The breast tissue is heterogeneously dense, which may obscure small masses. FINDINGS: Mammographically, there is a large hyperdense mass in the right outer central breast measuring 4.9 x 4.8 x 5.4 cm mammographically. Post biopsy marker is located within this mass. Prominent spiculations from the mass extent to the pectoralis muscle. There is at least 1 enlarged right axillary lymph node. Targeted right breast ultrasound demonstrates 11 o'clock 4 cm from nipple large solid mass with central necrosis measuring 4.4 x 5.1 x 3.5 cm. Previous measurements on 10/21/2020 from [facility] are 5.2 x 4.3 x 4.9 cm. There is 1 borderline abnormal lymph node in the right axilla with cortical thickening of 3 mm. IMPRESSION: Mostly stable in size but with prominence internal necrosis right breast malignancy. One borderline abnormal right axillary lymph node. RECOMMENDATION: Continue with plan of care for known right breast cancer. I have discussed the findings and recommendations  with the patient. If applicable, a reminder letter will be sent to the patient regarding the next appointment. BI-RADS CATEGORY  6: Known biopsy-proven malignancy. Electronically Signed   By: Fidela Salisbury M.D.   On: 04/14/2021 10:06  MM DIAG BREAST TOMO UNI RIGHT  Result Date: 04/14/2021 CLINICAL DATA:  Known right breast cancer. Recent chest CT demonstrated possible right axillary lymphadenopathy. EXAM: DIGITAL DIAGNOSTIC UNILATERAL RIGHT MAMMOGRAM WITH TOMOSYNTHESIS AND CAD; ULTRASOUND RIGHT BREAST LIMITED TECHNIQUE: Right digital diagnostic mammography and breast tomosynthesis was performed. The images were evaluated with computer-aided detection.; Targeted ultrasound examination of the right breast was performed COMPARISON:  Previous exam(s). ACR Breast Density Category c: The breast tissue is heterogeneously dense, which may obscure small masses. FINDINGS: Mammographically, there is a large hyperdense mass in the right outer central breast measuring 4.9 x 4.8 x 5.4 cm mammographically. Post biopsy marker is located within this mass. Prominent spiculations from the mass extent to the pectoralis muscle. There is at least 1 enlarged right axillary lymph node. Targeted right breast ultrasound demonstrates 11 o'clock 4 cm from nipple large solid mass with central necrosis measuring 4.4 x 5.1 x 3.5 cm. Previous measurements on 10/21/2020 from the [facility] are 5.2 x 4.3 x 4.9 cm. There is 1 borderline abnormal lymph node in the right axilla with cortical thickening of 3 mm. IMPRESSION: Mostly stable in size but with prominence internal necrosis right breast malignancy. One borderline abnormal right axillary lymph node. RECOMMENDATION: Continue with plan of care for known right breast cancer. I have discussed the findings and recommendations with the patient. If applicable, a reminder letter will be sent to the patient regarding the next appointment. BI-RADS CATEGORY  6: Known biopsy-proven  malignancy. Electronically Signed   By: Fidela Salisbury M.D.   On: 04/14/2021 10:06     ASSESSMENT & PLAN:  1. Encounter for antineoplastic chemotherapy   2. Invasive ductal carcinoma of right breast ([place])   3. Hypokalemia   4. Hypomagnesemia    Cancer Staging  Invasive ductal carcinoma of right breast [facility]) Staging form: Breast, AJCC 8th Edition - Clinical stage from 02/19/2021: Stage IIIB (cT3, cN0, cM0, G2, ER-, PR-, HER2-) - Signed by Earlie Server, MD on 02/20/2021  #Clinical stage III triple negative right breast cancer. cT3 cN0-1 Baseline elevated CA 15-3 and CA 27.29. Right diagnostic mammogram and ultrasound were done and the patient does have a small right axillary lymph node.-Recommend biopsy-radiology has contacted patient multiple times not able to reach her to schedule biopsy. Labs reviewed and discussed with patient. Proceed with cycle 3   # Hypokalemia, continue potassium chloride 22mq BID.    # Hypomagnesia, 1.3,  she will receive IV mag sulfate 4g x 1.  Continue Slow-Mag 64 mg twice daily..Marland Kitchen #Generalized body ache, negative bone scan.  Symptom has improved. Continue gabapentin refilled at 100 mg daily prescription..  Continue follow-up with palliative care service.   All questions were answered. The patient knows to call the clinic with any problems questions or concerns.  cc BEvern BioH, NP    Return of visit:   Follow  up in 1 week for lab md Taxol   ZEarlie Server MD, PhD  05/04/2021

## 2021-05-04 NOTE — Progress Notes (Signed)
Virtual Visit via Telephone Note  I connected with Yvonne Scott on 05/04/21 at  2:00 PM EST by telephone and verified that I am speaking with the correct person using two identifiers.  Location: Patient: Home Provider: Clinic   I discussed the limitations, risks, security and privacy concerns of performing an evaluation and management service by telephone and the availability of in person appointments. I also discussed with the patient that there may be a patient responsible charge related to this service. The patient expressed understanding and agreed to proceed.   History of Present Illness: Yvonne Scott is a 61 y.o. female with multiple medical problems including alcohol dependence, tobacco use, and stage IIIb triple negative right breast cancer.  Plan is for neoadjuvant chemotherapy/immunotherapy.  Patient has had breast pain and was referred to palliative care for symptom management.   Observations/Objective: I spoke with patient by phone to follow-up regarding pain management.  I had previously prescribed gabapentin in December and patient reports that she just ran out of it.  She admits to taking gabapentin intermittently and not regularly as previously discussed.  She was also just taking once a day.  She did not feel like it was significantly helping.  We did discuss maximizing the gabapentin as it is still low-dose.  Her pain was described as an "electric pain" at the site of her previous breast biopsy, likely reflecting a neuropathic component.  Recommended that she increase gabapentin to 3 times daily and we can further liberalize dosing as tolerated.  If that is ineffective, patient asked about trying different pain medications.  Assessment and Plan: Breast pain -refill gabapentin and increase to 3 times daily dosing  Follow Up Instructions: Follow-up telephone visit 1 month   I discussed the assessment and treatment plan with the patient. The patient was provided an  opportunity to ask questions and all were answered. The patient agreed with the plan and demonstrated an understanding of the instructions.   The patient was advised to call back or seek an in-person evaluation if the symptoms worsen or if the condition fails to improve as anticipated.  I provided 5 minutes of non-face-to-face time during this encounter.   Irean Hong, NP

## 2021-05-04 NOTE — Patient Instructions (Signed)
[facility] [place]  Discharge Instructions: Thank you for choosing [facility] to provide your oncology and hematology care.  If you have a lab appointment with the [facility], please go directly to the [facility] and check in at the registration area.  Wear comfortable clothing and clothing appropriate for easy access to any Portacath or PICC line.   We strive to give you quality time with your provider. You may need to reschedule your appointment if you arrive late (15 or more minutes).  Arriving late affects you and other patients whose appointments are after yours.  Also, if you miss three or more appointments without notifying the office, you may be dismissed from the clinic at the providers discretion.      For prescription refill requests, have your pharmacy contact our office and allow 72 hours for refills to be completed.    Today you received the following chemotherapy and/or immunotherapy agents KEYTRUDA, TAXOL, CARBOPLATIN, MAGNESIUM      To help prevent nausea and vomiting after your treatment, we encourage you to take your nausea medication as directed.  BELOW ARE SYMPTOMS THAT SHOULD BE REPORTED IMMEDIATELY: *FEVER GREATER THAN 100.4 F (38 C) OR HIGHER *CHILLS OR SWEATING *NAUSEA AND VOMITING THAT IS NOT CONTROLLED WITH YOUR NAUSEA MEDICATION *UNUSUAL SHORTNESS OF BREATH *UNUSUAL BRUISING OR BLEEDING *URINARY PROBLEMS (pain or burning when urinating, or frequent urination) *BOWEL PROBLEMS (unusual diarrhea, constipation, pain near the anus) TENDERNESS IN MOUTH AND THROAT WITH OR WITHOUT PRESENCE OF ULCERS (sore throat, sores in mouth, or a toothache) UNUSUAL RASH, SWELLING OR PAIN  UNUSUAL VAGINAL DISCHARGE OR ITCHING   Items with * indicate a potential emergency and should be followed up as soon as possible or go to the Emergency Department if any problems should occur.  Please show the CHEMOTHERAPY ALERT CARD or  IMMUNOTHERAPY ALERT CARD at check-in to the Emergency Department and triage nurse.  Should you have questions after your visit or need to cancel or reschedule your appointment, please contact [facility] [place] [facility] [place]  [phone] and follow the prompts.  Office hours are 8:00 a.m. to 4:30 p.m. Monday - Friday. Please note that voicemails left after 4:00 p.m. may not be returned until the following business day.  We are closed weekends and major holidays. You have access to a nurse at all times for urgent questions. Please call the main number to the clinic [phone] and follow the prompts.  For any non-urgent questions, you may also contact your provider using MyChart. We now offer e-Visits for anyone 2 and older to request care online for non-urgent symptoms. For details visit [email or web address].   Also download the MyChart app! Go to the app store, search "MyChart", open the app, select [facility], and log in with your MyChart username and password.  Due to Covid, a mask is required upon entering the hospital/clinic. If you do not have a mask, one will be given to you upon arrival. For doctor visits, patients may have 1 support person aged 61 or older with them. For treatment visits, patients cannot have anyone with them due to current Covid guidelines and our immunocompromised population.   Pembrolizumab injection What is this medication? PEMBROLIZUMAB (pem broe liz ue mab) is a monoclonal antibody. It is used to treat certain types of cancer. This medicine may be used for other purposes; ask your health care provider or pharmacist if you have questions. COMMON BRAND NAME(S): Keytruda What should I tell  my care team before I take this medication? They need to know if you have any of these conditions: autoimmune diseases like Crohn's disease, ulcerative colitis, or lupus have had or planning to have an allogeneic stem cell transplant (uses someone else's stem  cells) history of organ transplant history of chest radiation nervous system problems like myasthenia gravis or Guillain-Barre syndrome an unusual or allergic reaction to pembrolizumab, other medicines, foods, dyes, or preservatives pregnant or trying to get pregnant breast-feeding How should I use this medication? This medicine is for infusion into a vein. It is given by a health care professional in a hospital or clinic setting. A special MedGuide will be given to you before each treatment. Be sure to read this information carefully each time. Talk to your pediatrician regarding the use of this medicine in children. While this drug may be prescribed for children as young as 6 months for selected conditions, precautions do apply. Overdosage: If you think you have taken too much of this medicine contact a poison control center or emergency room at once. NOTE: This medicine is only for you. Do not share this medicine with others. What if I miss a dose? It is important not to miss your dose. Call your doctor or health care professional if you are unable to keep an appointment. What may interact with this medication? Interactions have not been studied. This list may not describe all possible interactions. Give your health care provider a list of all the medicines, herbs, non-prescription drugs, or dietary supplements you use. Also tell them if you smoke, drink alcohol, or use illegal drugs. Some items may interact with your medicine. What should I watch for while using this medication? Your condition will be monitored carefully while you are receiving this medicine. You may need blood work done while you are taking this medicine. Do not become pregnant while taking this medicine or for 4 months after stopping it. Women should inform their doctor if they wish to become pregnant or think they might be pregnant. There is a potential for serious side effects to an unborn child. Talk to your health care  professional or pharmacist for more information. Do not breast-feed an infant while taking this medicine or for 4 months after the last dose. What side effects may I notice from receiving this medication? Side effects that you should report to your doctor or health care professional as soon as possible: allergic reactions like skin rash, itching or hives, swelling of the face, lips, or tongue bloody or black, tarry breathing problems changes in vision chest pain chills confusion constipation cough diarrhea dizziness or feeling faint or lightheaded fast or irregular heartbeat fever flushing joint pain low blood counts - this medicine may decrease the number of white blood cells, red blood cells and platelets. You may be at increased risk for infections and bleeding. muscle pain muscle weakness pain, tingling, numbness in the hands or feet persistent headache redness, blistering, peeling or loosening of the skin, including inside the mouth signs and symptoms of high blood sugar such as dizziness; dry mouth; dry skin; fruity breath; nausea; stomach pain; increased hunger or thirst; increased urination signs and symptoms of kidney injury like trouble passing urine or change in the amount of urine signs and symptoms of liver injury like dark urine, light-colored stools, loss of appetite, nausea, right upper belly pain, yellowing of the eyes or skin sweating swollen lymph nodes weight loss Side effects that usually do not require medical attention (report to  your doctor or health care professional if they continue or are bothersome): decreased appetite hair loss tiredness This list may not describe all possible side effects. Call your doctor for medical advice about side effects. You may report side effects to FDA at 1-800-FDA-1088. Where should I keep my medication? This drug is given in a hospital or clinic and will not be stored at home. NOTE: This sheet is a summary. It may not  cover all possible information. If you have questions about this medicine, talk to your doctor, pharmacist, or health care provider.  2022 Elsevier/Gold Standard (2020-11-17 00:00:00)  Paclitaxel injection What is this medication? PACLITAXEL (PAK li TAX el) is a chemotherapy drug. It targets fast dividing cells, like cancer cells, and causes these cells to die. This medicine is used to treat ovarian cancer, breast cancer, lung cancer, Kaposi's sarcoma, and other cancers. This medicine may be used for other purposes; ask your health care provider or pharmacist if you have questions. COMMON BRAND NAME(S): Onxol, Taxol What should I tell my care team before I take this medication? They need to know if you have any of these conditions: history of irregular heartbeat liver disease low blood counts, like low white cell, platelet, or red cell counts lung or breathing disease, like asthma tingling of the fingers or toes, or other nerve disorder an unusual or allergic reaction to paclitaxel, alcohol, polyoxyethylated castor oil, other chemotherapy, other medicines, foods, dyes, or preservatives pregnant or trying to get pregnant breast-feeding How should I use this medication? This drug is given as an infusion into a vein. It is administered in a hospital or clinic by a specially trained health care professional. Talk to your pediatrician regarding the use of this medicine in children. Special care may be needed. Overdosage: If you think you have taken too much of this medicine contact a poison control center or emergency room at once. NOTE: This medicine is only for you. Do not share this medicine with others. What if I miss a dose? It is important not to miss your dose. Call your doctor or health care professional if you are unable to keep an appointment. What may interact with this medication? Do not take this medicine with any of the following medications: live virus vaccines This medicine  may also interact with the following medications: antiviral medicines for hepatitis, HIV or AIDS certain antibiotics like erythromycin and clarithromycin certain medicines for fungal infections like ketoconazole and itraconazole certain medicines for seizures like carbamazepine, phenobarbital, phenytoin gemfibrozil nefazodone rifampin St. John's wort This list may not describe all possible interactions. Give your health care provider a list of all the medicines, herbs, non-prescription drugs, or dietary supplements you use. Also tell them if you smoke, drink alcohol, or use illegal drugs. Some items may interact with your medicine. What should I watch for while using this medication? Your condition will be monitored carefully while you are receiving this medicine. You will need important blood work done while you are taking this medicine. This medicine can cause serious allergic reactions. To reduce your risk you will need to take other medicine(s) before treatment with this medicine. If you experience allergic reactions like skin rash, itching or hives, swelling of the face, lips, or tongue, tell your doctor or health care professional right away. In some cases, you may be given additional medicines to help with side effects. Follow all directions for their use. This drug may make you feel generally unwell. This is not uncommon, as chemotherapy can  affect healthy cells as well as cancer cells. Report any side effects. Continue your course of treatment even though you feel ill unless your doctor tells you to stop. Call your doctor or health care professional for advice if you get a fever, chills or sore throat, or other symptoms of a cold or flu. Do not treat yourself. This drug decreases your body's ability to fight infections. Try to avoid being around people who are sick. This medicine may increase your risk to bruise or bleed. Call your doctor or health care professional if you notice any unusual  bleeding. Be careful brushing and flossing your teeth or using a toothpick because you may get an infection or bleed more easily. If you have any dental work done, tell your dentist you are receiving this medicine. Avoid taking products that contain aspirin, acetaminophen, ibuprofen, naproxen, or ketoprofen unless instructed by your doctor. These medicines may hide a fever. Do not become pregnant while taking this medicine. Women should inform their doctor if they wish to become pregnant or think they might be pregnant. There is a potential for serious side effects to an unborn child. Talk to your health care professional or pharmacist for more information. Do not breast-feed an infant while taking this medicine. Men are advised not to father a child while receiving this medicine. This product may contain alcohol. Ask your pharmacist or healthcare provider if this medicine contains alcohol. Be sure to tell all healthcare providers you are taking this medicine. Certain medicines, like metronidazole and disulfiram, can cause an unpleasant reaction when taken with alcohol. The reaction includes flushing, headache, nausea, vomiting, sweating, and increased thirst. The reaction can last from 30 minutes to several hours. What side effects may I notice from receiving this medication? Side effects that you should report to your doctor or health care professional as soon as possible: allergic reactions like skin rash, itching or hives, swelling of the face, lips, or tongue breathing problems changes in vision fast, irregular heartbeat high or low blood pressure mouth sores pain, tingling, numbness in the hands or feet signs of decreased platelets or bleeding - bruising, pinpoint red spots on the skin, black, tarry stools, blood in the urine signs of decreased red blood cells - unusually weak or tired, feeling faint or lightheaded, falls signs of infection - fever or chills, cough, sore throat, pain or  difficulty passing urine signs and symptoms of liver injury like dark yellow or brown urine; general ill feeling or flu-like symptoms; light-colored stools; loss of appetite; nausea; right upper belly pain; unusually weak or tired; yellowing of the eyes or skin swelling of the ankles, feet, hands unusually slow heartbeat Side effects that usually do not require medical attention (report to your doctor or health care professional if they continue or are bothersome): diarrhea hair loss loss of appetite muscle or joint pain nausea, vomiting pain, redness, or irritation at site where injected tiredness This list may not describe all possible side effects. Call your doctor for medical advice about side effects. You may report side effects to FDA at 1-800-FDA-1088. Where should I keep my medication? This drug is given in a hospital or clinic and will not be stored at home. NOTE: This sheet is a summary. It may not cover all possible information. If you have questions about this medicine, talk to your doctor, pharmacist, or health care provider.  2022 Elsevier/Gold Standard (2020-11-17 00:00:00)  Carboplatin injection What is this medication? CARBOPLATIN (KAR boe pla tin) is a chemotherapy  drug. It targets fast dividing cells, like cancer cells, and causes these cells to die. This medicine is used to treat ovarian cancer and many other cancers. This medicine may be used for other purposes; ask your health care provider or pharmacist if you have questions. COMMON BRAND NAME(S): Paraplatin What should I tell my care team before I take this medication? They need to know if you have any of these conditions: blood disorders hearing problems kidney disease recent or ongoing radiation therapy an unusual or allergic reaction to carboplatin, cisplatin, other chemotherapy, other medicines, foods, dyes, or preservatives pregnant or trying to get pregnant breast-feeding How should I use this  medication? This drug is usually given as an infusion into a vein. It is administered in a hospital or clinic by a specially trained health care professional. Talk to your pediatrician regarding the use of this medicine in children. Special care may be needed. Overdosage: If you think you have taken too much of this medicine contact a poison control center or emergency room at once. NOTE: This medicine is only for you. Do not share this medicine with others. What if I miss a dose? It is important not to miss a dose. Call your doctor or health care professional if you are unable to keep an appointment. What may interact with this medication? medicines for seizures medicines to increase blood counts like filgrastim, pegfilgrastim, sargramostim some antibiotics like amikacin, gentamicin, neomycin, streptomycin, tobramycin vaccines Talk to your doctor or health care professional before taking any of these medicines: acetaminophen aspirin ibuprofen ketoprofen naproxen This list may not describe all possible interactions. Give your health care provider a list of all the medicines, herbs, non-prescription drugs, or dietary supplements you use. Also tell them if you smoke, drink alcohol, or use illegal drugs. Some items may interact with your medicine. What should I watch for while using this medication? Your condition will be monitored carefully while you are receiving this medicine. You will need important blood work done while you are taking this medicine. This drug may make you feel generally unwell. This is not uncommon, as chemotherapy can affect healthy cells as well as cancer cells. Report any side effects. Continue your course of treatment even though you feel ill unless your doctor tells you to stop. In some cases, you may be given additional medicines to help with side effects. Follow all directions for their use. Call your doctor or health care professional for advice if you get a fever,  chills or sore throat, or other symptoms of a cold or flu. Do not treat yourself. This drug decreases your body's ability to fight infections. Try to avoid being around people who are sick. This medicine may increase your risk to bruise or bleed. Call your doctor or health care professional if you notice any unusual bleeding. Be careful brushing and flossing your teeth or using a toothpick because you may get an infection or bleed more easily. If you have any dental work done, tell your dentist you are receiving this medicine. Avoid taking products that contain aspirin, acetaminophen, ibuprofen, naproxen, or ketoprofen unless instructed by your doctor. These medicines may hide a fever. Do not become pregnant while taking this medicine. Women should inform their doctor if they wish to become pregnant or think they might be pregnant. There is a potential for serious side effects to an unborn child. Talk to your health care professional or pharmacist for more information. Do not breast-feed an infant while taking this medicine. What  side effects may I notice from receiving this medication? Side effects that you should report to your doctor or health care professional as soon as possible: allergic reactions like skin rash, itching or hives, swelling of the face, lips, or tongue signs of infection - fever or chills, cough, sore throat, pain or difficulty passing urine signs of decreased platelets or bleeding - bruising, pinpoint red spots on the skin, black, tarry stools, nosebleeds signs of decreased red blood cells - unusually weak or tired, fainting spells, lightheadedness breathing problems changes in hearing changes in vision chest pain high blood pressure low blood counts - This drug may decrease the number of white blood cells, red blood cells and platelets. You may be at increased risk for infections and bleeding. nausea and vomiting pain, swelling, redness or irritation at the injection  site pain, tingling, numbness in the hands or feet problems with balance, talking, walking trouble passing urine or change in the amount of urine Side effects that usually do not require medical attention (report to your doctor or health care professional if they continue or are bothersome): hair loss loss of appetite metallic taste in the mouth or changes in taste This list may not describe all possible side effects. Call your doctor for medical advice about side effects. You may report side effects to FDA at 1-800-FDA-1088. Where should I keep my medication? This drug is given in a hospital or clinic and will not be stored at home. NOTE: This sheet is a summary. It may not cover all possible information. If you have questions about this medicine, talk to your doctor, pharmacist, or health care provider.  2022 Elsevier/Gold Standard (2007-08-08 00:00:00)  Magnesium Sulfate Injection What is this medication? MAGNESIUM SULFATE (mag NEE zee um SUL fate) prevents and treats low levels of magnesium in your body. It may also be used to prevent and treat seizures during pregnancy in people with high blood pressure disorders, such as preeclampsia or eclampsia. Magnesium plays an important role in maintaining the health of your muscles and nervous system. This medicine may be used for other purposes; ask your health care provider or pharmacist if you have questions. What should I tell my care team before I take this medication? They need to know if you have any of these conditions: Heart disease History of irregular heart beat Kidney disease An unusual or allergic reaction to magnesium sulfate, medications, foods, dyes, or preservatives Pregnant or trying to get pregnant Breast-feeding How should I use this medication? This medication is for infusion into a vein. It is given in a hospital or clinic setting. Talk to your care team about the use of this medication in children. While this  medication may be prescribed for selected conditions, precautions do apply. Overdosage: If you think you have taken too much of this medicine contact a poison control center or emergency room at once. NOTE: This medicine is only for you. Do not share this medicine with others. What if I miss a dose? This does not apply. What may interact with this medication? Certain medications for anxiety or sleep Certain medications for seizures like phenobarbital Digoxin Medications that relax muscles for surgery Narcotic medications for pain This list may not describe all possible interactions. Give your health care provider a list of all the medicines, herbs, non-prescription drugs, or dietary supplements you use. Also tell them if you smoke, drink alcohol, or use illegal drugs. Some items may interact with your medicine. What should I watch for while using  this medication? Your condition will be monitored carefully while you are receiving this medication. You may need blood work done while you are receiving this medication. What side effects may I notice from receiving this medication? Side effects that you should report to your care team as soon as possible: Allergic reactions--skin rash, itching, hives, swelling of the face, lips, tongue, or throat High magnesium level--confusion, drowsiness, facial flushing, redness, sweating, muscle weakness, fast or irregular heartbeat, trouble breathing Low blood pressure--dizziness, feeling faint or lightheaded, blurry vision Side effects that usually do not require medical attention (report to your care team if they continue or are bothersome): Headache Nausea This list may not describe all possible side effects. Call your doctor for medical advice about side effects. You may report side effects to FDA at 1-800-FDA-1088. Where should I keep my medication? This medication is given in a hospital or clinic and will not be stored at home. NOTE: This sheet is a  summary. It may not cover all possible information. If you have questions about this medicine, talk to your doctor, pharmacist, or health care provider.  2022 Elsevier/Gold Standard (2020-05-14 00:00:00)

## 2021-05-05 ENCOUNTER — Inpatient Hospital Stay: Payer: Medicaid Other

## 2021-05-06 ENCOUNTER — Inpatient Hospital Stay: Payer: Medicaid Other

## 2021-05-11 ENCOUNTER — Other Ambulatory Visit: Payer: Self-pay

## 2021-05-11 ENCOUNTER — Encounter: Payer: Self-pay | Admitting: Oncology

## 2021-05-11 ENCOUNTER — Inpatient Hospital Stay: Payer: Medicaid Other

## 2021-05-11 ENCOUNTER — Inpatient Hospital Stay (HOSPITAL_BASED_OUTPATIENT_CLINIC_OR_DEPARTMENT_OTHER): Payer: Medicaid Other | Admitting: Oncology

## 2021-05-11 VITALS — BP 129/75 | HR 97 | Temp 97.2°F | Wt 110.0 lb

## 2021-05-11 DIAGNOSIS — Z5111 Encounter for antineoplastic chemotherapy: Secondary | ICD-10-CM

## 2021-05-11 DIAGNOSIS — C50911 Malignant neoplasm of unspecified site of right female breast: Secondary | ICD-10-CM

## 2021-05-11 DIAGNOSIS — E876 Hypokalemia: Secondary | ICD-10-CM

## 2021-05-11 DIAGNOSIS — Z5112 Encounter for antineoplastic immunotherapy: Secondary | ICD-10-CM | POA: Diagnosis not present

## 2021-05-11 DIAGNOSIS — G893 Neoplasm related pain (acute) (chronic): Secondary | ICD-10-CM

## 2021-05-11 DIAGNOSIS — F4321 Adjustment disorder with depressed mood: Secondary | ICD-10-CM

## 2021-05-11 DIAGNOSIS — F419 Anxiety disorder, unspecified: Secondary | ICD-10-CM

## 2021-05-11 LAB — COMPREHENSIVE METABOLIC PANEL
ALT: 13 U/L (ref 0–44)
AST: 23 U/L (ref 15–41)
Albumin: 3.6 g/dL (ref 3.5–5.0)
Alkaline Phosphatase: 54 U/L (ref 38–126)
Anion gap: 12 (ref 5–15)
BUN: 11 mg/dL (ref 8–23)
CO2: 23 mmol/L (ref 22–32)
Calcium: 8.9 mg/dL (ref 8.9–10.3)
Chloride: 99 mmol/L (ref 98–111)
Creatinine, Ser: 0.66 mg/dL (ref 0.44–1.00)
GFR, Estimated: >60 mL/min (ref >60)
Glucose, Bld: 90 mg/dL (ref 70–99)
Potassium: 3.2 mmol/L — ABNORMAL LOW (ref 3.5–5.1)
Sodium: 134 mmol/L — ABNORMAL LOW (ref 135–145)
Total Bilirubin: <0.1 mg/dL — ABNORMAL LOW (ref 0.3–1.2)
Total Protein: 6.4 g/dL — ABNORMAL LOW (ref 6.5–8.1)

## 2021-05-11 LAB — CBC WITH DIFFERENTIAL/PLATELET
Abs Immature Granulocytes: 0.08 10*3/uL — ABNORMAL HIGH (ref 0.00–0.07)
Basophils Absolute: 0 10*3/uL (ref 0.0–0.1)
Basophils Relative: 0 %
Eosinophils Absolute: 0 10*3/uL (ref 0.0–0.5)
Eosinophils Relative: 0 %
HCT: 27.3 % — ABNORMAL LOW (ref 36.0–46.0)
Hemoglobin: 9.3 g/dL — ABNORMAL LOW (ref 12.0–15.0)
Immature Granulocytes: 1 %
Lymphocytes Relative: 50 %
Lymphs Abs: 3.9 10*3/uL (ref 0.7–4.0)
MCH: 33.3 pg (ref 26.0–34.0)
MCHC: 34.1 g/dL (ref 30.0–36.0)
MCV: 97.8 fL (ref 80.0–100.0)
Monocytes Absolute: 0.2 10*3/uL (ref 0.1–1.0)
Monocytes Relative: 2 %
Neutro Abs: 3.8 10*3/uL (ref 1.7–7.7)
Neutrophils Relative %: 47 %
Platelets: 420 10*3/uL — ABNORMAL HIGH (ref 150–400)
RBC: 2.79 MIL/uL — ABNORMAL LOW (ref 3.87–5.11)
RDW: 20.1 % — ABNORMAL HIGH (ref 11.5–15.5)
WBC: 8 10*3/uL (ref 4.0–10.5)
nRBC: 0 % (ref 0.0–0.2)

## 2021-05-11 LAB — MAGNESIUM: Magnesium: 1.3 mg/dL — ABNORMAL LOW (ref 1.7–2.4)

## 2021-05-11 LAB — SAMPLE TO BLOOD BANK

## 2021-05-11 MED ORDER — FAMOTIDINE IN NACL 20-0.9 MG/50ML-% IV SOLN
20.0000 mg | Freq: Once | INTRAVENOUS | Status: AC
  Administered 2021-05-11: 20 mg via INTRAVENOUS
  Filled 2021-05-11: qty 50

## 2021-05-11 MED ORDER — LORAZEPAM 2 MG/ML IJ SOLN
0.5000 mg | Freq: Once | INTRAMUSCULAR | Status: AC
  Administered 2021-05-11: 0.5 mg via INTRAVENOUS
  Filled 2021-05-11: qty 1

## 2021-05-11 MED ORDER — MAGNESIUM SULFATE 4 GM/100ML IV SOLN
4.0000 g | Freq: Once | INTRAVENOUS | Status: AC
  Administered 2021-05-11: 4 g via INTRAVENOUS
  Filled 2021-05-11: qty 100

## 2021-05-11 MED ORDER — SODIUM CHLORIDE 0.9 % IV SOLN
10.0000 mg | Freq: Once | INTRAVENOUS | Status: AC
  Administered 2021-05-11: 10 mg via INTRAVENOUS
  Filled 2021-05-11: qty 10

## 2021-05-11 MED ORDER — SODIUM CHLORIDE 0.9 % IV SOLN
Freq: Once | INTRAVENOUS | Status: AC
  Filled 2021-05-11: qty 250

## 2021-05-11 MED ORDER — SODIUM CHLORIDE 0.9 % IV SOLN
80.0000 mg/m2 | Freq: Once | INTRAVENOUS | Status: AC
  Administered 2021-05-11: 120 mg via INTRAVENOUS
  Filled 2021-05-11: qty 20

## 2021-05-11 MED ORDER — DIPHENHYDRAMINE HCL 50 MG/ML IJ SOLN
50.0000 mg | Freq: Once | INTRAMUSCULAR | Status: AC
  Administered 2021-05-11: 50 mg via INTRAVENOUS
  Filled 2021-05-11: qty 1

## 2021-05-11 MED ORDER — POTASSIUM CHLORIDE 20 MEQ/100ML IV SOLN
20.0000 meq | Freq: Once | INTRAVENOUS | Status: AC
  Administered 2021-05-11: 20 meq via INTRAVENOUS

## 2021-05-11 MED ORDER — HEPARIN SOD (PORK) LOCK FLUSH 100 UNIT/ML IV SOLN
500.0000 [IU] | Freq: Once | INTRAVENOUS | Status: AC | PRN
  Administered 2021-05-11: 500 [IU]
  Filled 2021-05-11: qty 5

## 2021-05-11 MED ORDER — LORAZEPAM 0.5 MG PO TABS: 0.5000 mg | ORAL_TABLET | Freq: Two times a day (BID) | ORAL | 0 refills | Status: DC | PRN

## 2021-05-11 NOTE — Progress Notes (Signed)
Patient here for follow up. Patient complains of bones being sore.

## 2021-05-11 NOTE — Patient Instructions (Signed)
[facility] AT [place]  Discharge Instructions: Thank you for choosing [facility] to provide your oncology and hematology care.  If you have a lab appointment with the [facility], please go directly to the [facility] and check in at the registration area.  Wear comfortable clothing and clothing appropriate for easy access to any Portacath or PICC line.   We strive to give you quality time with your provider. You may need to reschedule your appointment if you arrive late (15 or more minutes).  Arriving late affects you and other patients whose appointments are after yours.  Also, if you miss three or more appointments without notifying the office, you may be dismissed from the clinic at the providers discretion.      For prescription refill requests, have your pharmacy contact our office and allow 72 hours for refills to be completed.    Today you received the following chemotherapy and/or immunotherapy agents TAXOL, POTASSIUM, MAGNESIUM      To help prevent nausea and vomiting after your treatment, we encourage you to take your nausea medication as directed.  BELOW ARE SYMPTOMS THAT SHOULD BE REPORTED IMMEDIATELY: *FEVER GREATER THAN 100.4 F (38 C) OR HIGHER *CHILLS OR SWEATING *NAUSEA AND VOMITING THAT IS NOT CONTROLLED WITH YOUR NAUSEA MEDICATION *UNUSUAL SHORTNESS OF BREATH *UNUSUAL BRUISING OR BLEEDING *URINARY PROBLEMS (pain or burning when urinating, or frequent urination) *BOWEL PROBLEMS (unusual diarrhea, constipation, pain near the anus) TENDERNESS IN MOUTH AND THROAT WITH OR WITHOUT PRESENCE OF ULCERS (sore throat, sores in mouth, or a toothache) UNUSUAL RASH, SWELLING OR PAIN  UNUSUAL VAGINAL DISCHARGE OR ITCHING   Items with * indicate a potential emergency and should be followed up as soon as possible or go to the Emergency Department if any problems should occur.  Please show the CHEMOTHERAPY ALERT CARD or IMMUNOTHERAPY ALERT  CARD at check-in to the Emergency Department and triage nurse.  Should you have questions after your visit or need to cancel or reschedule your appointment, please contact [facility] [place] [facility] [place]  [phone] and follow the prompts.  Office hours are 8:00 a.m. to 4:30 p.m. Monday - Friday. Please note that voicemails left after 4:00 p.m. may not be returned until the following business day.  We are closed weekends and major holidays. You have access to a nurse at all times for urgent questions. Please call the main number to the clinic [phone] and follow the prompts.  For any non-urgent questions, you may also contact your provider using MyChart. We now offer e-Visits for anyone 49 and older to request care online for non-urgent symptoms. For details visit [email or web address].   Also download the MyChart app! Go to the app store, search "MyChart", open the app, select [facility], and log in with your MyChart username and password.  Due to Covid, a mask is required upon entering the hospital/clinic. If you do not have a mask, one will be given to you upon arrival. For doctor visits, patients may have 1 support person aged 21 or older with them. For treatment visits, patients cannot have anyone with them due to current Covid guidelines and our immunocompromised population.   Paclitaxel injection What is this medication? PACLITAXEL (PAK li TAX el) is a chemotherapy drug. It targets fast dividing cells, like cancer cells, and causes these cells to die. This medicine is used to treat ovarian cancer, breast cancer, lung cancer, Kaposi's sarcoma, and other cancers. This medicine may be used for other  purposes; ask your health care provider or pharmacist if you have questions. COMMON BRAND NAME(S): Onxol, Taxol What should I tell my care team before I take this medication? They need to know if you have any of these conditions: history of irregular heartbeat liver  disease low blood counts, like low white cell, platelet, or red cell counts lung or breathing disease, like asthma tingling of the fingers or toes, or other nerve disorder an unusual or allergic reaction to paclitaxel, alcohol, polyoxyethylated castor oil, other chemotherapy, other medicines, foods, dyes, or preservatives pregnant or trying to get pregnant breast-feeding How should I use this medication? This drug is given as an infusion into a vein. It is administered in a hospital or clinic by a specially trained health care professional. Talk to your pediatrician regarding the use of this medicine in children. Special care may be needed. Overdosage: If you think you have taken too much of this medicine contact a poison control center or emergency room at once. NOTE: This medicine is only for you. Do not share this medicine with others. What if I miss a dose? It is important not to miss your dose. Call your doctor or health care professional if you are unable to keep an appointment. What may interact with this medication? Do not take this medicine with any of the following medications: live virus vaccines This medicine may also interact with the following medications: antiviral medicines for hepatitis, HIV or AIDS certain antibiotics like erythromycin and clarithromycin certain medicines for fungal infections like ketoconazole and itraconazole certain medicines for seizures like carbamazepine, phenobarbital, phenytoin gemfibrozil nefazodone rifampin St. John's wort This list may not describe all possible interactions. Give your health care provider a list of all the medicines, herbs, non-prescription drugs, or dietary supplements you use. Also tell them if you smoke, drink alcohol, or use illegal drugs. Some items may interact with your medicine. What should I watch for while using this medication? Your condition will be monitored carefully while you are receiving this medicine. You  will need important blood work done while you are taking this medicine. This medicine can cause serious allergic reactions. To reduce your risk you will need to take other medicine(s) before treatment with this medicine. If you experience allergic reactions like skin rash, itching or hives, swelling of the face, lips, or tongue, tell your doctor or health care professional right away. In some cases, you may be given additional medicines to help with side effects. Follow all directions for their use. This drug may make you feel generally unwell. This is not uncommon, as chemotherapy can affect healthy cells as well as cancer cells. Report any side effects. Continue your course of treatment even though you feel ill unless your doctor tells you to stop. Call your doctor or health care professional for advice if you get a fever, chills or sore throat, or other symptoms of a cold or flu. Do not treat yourself. This drug decreases your body's ability to fight infections. Try to avoid being around people who are sick. This medicine may increase your risk to bruise or bleed. Call your doctor or health care professional if you notice any unusual bleeding. Be careful brushing and flossing your teeth or using a toothpick because you may get an infection or bleed more easily. If you have any dental work done, tell your dentist you are receiving this medicine. Avoid taking products that contain aspirin, acetaminophen, ibuprofen, naproxen, or ketoprofen unless instructed by your doctor. These medicines may  hide a fever. Do not become pregnant while taking this medicine. Women should inform their doctor if they wish to become pregnant or think they might be pregnant. There is a potential for serious side effects to an unborn child. Talk to your health care professional or pharmacist for more information. Do not breast-feed an infant while taking this medicine. Men are advised not to father a child while receiving this  medicine. This product may contain alcohol. Ask your pharmacist or healthcare provider if this medicine contains alcohol. Be sure to tell all healthcare providers you are taking this medicine. Certain medicines, like metronidazole and disulfiram, can cause an unpleasant reaction when taken with alcohol. The reaction includes flushing, headache, nausea, vomiting, sweating, and increased thirst. The reaction can last from 30 minutes to several hours. What side effects may I notice from receiving this medication? Side effects that you should report to your doctor or health care professional as soon as possible: allergic reactions like skin rash, itching or hives, swelling of the face, lips, or tongue breathing problems changes in vision fast, irregular heartbeat high or low blood pressure mouth sores pain, tingling, numbness in the hands or feet signs of decreased platelets or bleeding - bruising, pinpoint red spots on the skin, black, tarry stools, blood in the urine signs of decreased red blood cells - unusually weak or tired, feeling faint or lightheaded, falls signs of infection - fever or chills, cough, sore throat, pain or difficulty passing urine signs and symptoms of liver injury like dark yellow or brown urine; general ill feeling or flu-like symptoms; light-colored stools; loss of appetite; nausea; right upper belly pain; unusually weak or tired; yellowing of the eyes or skin swelling of the ankles, feet, hands unusually slow heartbeat Side effects that usually do not require medical attention (report to your doctor or health care professional if they continue or are bothersome): diarrhea hair loss loss of appetite muscle or joint pain nausea, vomiting pain, redness, or irritation at site where injected tiredness This list may not describe all possible side effects. Call your doctor for medical advice about side effects. You may report side effects to FDA at 1-800-FDA-1088. Where  should I keep my medication? This drug is given in a hospital or clinic and will not be stored at home. NOTE: This sheet is a summary. It may not cover all possible information. If you have questions about this medicine, talk to your doctor, pharmacist, or health care provider.  2022 Elsevier/Gold Standard (2020-11-17 00:00:00)  Potassium Chloride Injection What is this medication? POTASSIUM CHLORIDE (poe TASS i um KLOOR ide) prevents and treats low levels of potassium in your body. Potassium plays an important role in maintaining the health of your kidneys, heart, muscles, and nervous system. This medicine may be used for other purposes; ask your health care provider or pharmacist if you have questions. COMMON BRAND NAME(S): PROAMP What should I tell my care team before I take this medication? They need to know if you have any of these conditions: Addison disease Dehydration Diabetes (high blood sugar) Heart disease High levels of potassium in the blood Irregular heartbeat or rhythm Kidney disease Large areas of burned skin An unusual or allergic reaction to potassium, other medications, foods, dyes, or preservatives Pregnant or trying to get pregnant Breast-feeding How should I use this medication? This medication is injected into a vein. It is given in a hospital or clinic setting. Talk to your care team about the use of this medication in  children. Special care may be needed. Overdosage: If you think you have taken too much of this medicine contact a poison control center or emergency room at once. NOTE: This medicine is only for you. Do not share this medicine with others. What if I miss a dose? This does not apply. This medication is not for regular use. What may interact with this medication? Do not take this medication with any of the following: Certain diuretics such as spironolactone, triamterene Eplerenone Sodium polystyrene sulfonate This medication may also interact  with the following: Certain medications for blood pressure or heart disease like lisinopril, losartan, quinapril, valsartan Medications that lower your chance of fighting infection such as cyclosporine, tacrolimus NSAIDs, medications for pain and inflammation, like ibuprofen or naproxen Other potassium supplements Salt substitutes This list may not describe all possible interactions. Give your health care provider a list of all the medicines, herbs, non-prescription drugs, or dietary supplements you use. Also tell them if you smoke, drink alcohol, or use illegal drugs. Some items may interact with your medicine. What should I watch for while using this medication? Visit your care team for regular checks on your progress. Tell your care team if your symptoms do not start to get better or if they get worse. You may need blood work while you are taking this medication. Avoid salt substitutes unless you are told otherwise by your care team. What side effects may I notice from receiving this medication? Side effects that you should report to your care team as soon as possible: Allergic reactions--skin rash, itching, hives, swelling of the face, lips, tongue, or throat High potassium level--muscle weakness, fast or irregular heartbeat Side effects that usually do not require medical attention (report to your care team if they continue or are bothersome): Diarrhea Nausea Stomach pain Vomiting This list may not describe all possible side effects. Call your doctor for medical advice about side effects. You may report side effects to FDA at 1-800-FDA-1088. Where should I keep my medication? This medication is given in a hospital or clinic. It will not be stored at home. NOTE: This sheet is a summary. It may not cover all possible information. If you have questions about this medicine, talk to your doctor, pharmacist, or health care provider.  2022 Elsevier/Gold Standard (2020-06-16  00:00:00)  Magnesium Sulfate Injection What is this medication? MAGNESIUM SULFATE (mag NEE zee um SUL fate) prevents and treats low levels of magnesium in your body. It may also be used to prevent and treat seizures during pregnancy in people with high blood pressure disorders, such as preeclampsia or eclampsia. Magnesium plays an important role in maintaining the health of your muscles and nervous system. This medicine may be used for other purposes; ask your health care provider or pharmacist if you have questions. What should I tell my care team before I take this medication? They need to know if you have any of these conditions: Heart disease History of irregular heart beat Kidney disease An unusual or allergic reaction to magnesium sulfate, medications, foods, dyes, or preservatives Pregnant or trying to get pregnant Breast-feeding How should I use this medication? This medication is for infusion into a vein. It is given in a hospital or clinic setting. Talk to your care team about the use of this medication in children. While this medication may be prescribed for selected conditions, precautions do apply. Overdosage: If you think you have taken too much of this medicine contact a poison control center or emergency room at  once. NOTE: This medicine is only for you. Do not share this medicine with others. What if I miss a dose? This does not apply. What may interact with this medication? Certain medications for anxiety or sleep Certain medications for seizures like phenobarbital Digoxin Medications that relax muscles for surgery Narcotic medications for pain This list may not describe all possible interactions. Give your health care provider a list of all the medicines, herbs, non-prescription drugs, or dietary supplements you use. Also tell them if you smoke, drink alcohol, or use illegal drugs. Some items may interact with your medicine. What should I watch for while using this  medication? Your condition will be monitored carefully while you are receiving this medication. You may need blood work done while you are receiving this medication. What side effects may I notice from receiving this medication? Side effects that you should report to your care team as soon as possible: Allergic reactions--skin rash, itching, hives, swelling of the face, lips, tongue, or throat High magnesium level--confusion, drowsiness, facial flushing, redness, sweating, muscle weakness, fast or irregular heartbeat, trouble breathing Low blood pressure--dizziness, feeling faint or lightheaded, blurry vision Side effects that usually do not require medical attention (report to your care team if they continue or are bothersome): Headache Nausea This list may not describe all possible side effects. Call your doctor for medical advice about side effects. You may report side effects to FDA at 1-800-FDA-1088. Where should I keep my medication? This medication is given in a hospital or clinic and will not be stored at home. NOTE: This sheet is a summary. It may not cover all possible information. If you have questions about this medicine, talk to your doctor, pharmacist, or health care provider.  2022 Elsevier/Gold Standard (2020-05-14 00:00:00)

## 2021-05-11 NOTE — Progress Notes (Signed)
Hematology/Oncology Progress note [phone] [phone]         Patient Care Team: [name], NP as PCP - General (Nurse Practitioner)  REFERRING PROVIDER: [name], NP  CHIEF COMPLAINTS/REASON FOR VISIT:  Follow-up for triple negative right breast cancer.  HISTORY OF PRESENTING ILLNESS:   ZAMERIA VOGL is a  61 y.o.  female with PMH listed below was seen in consultation at the request of  [name], NP  for evaluation of breast cancer.  August 2022, patient was diagnosed with right breast stage IIIb cT3 N0M0, grade 2, ER negative, PR negative HER2 negative breast cancer.  Patient self palpated breast mass many years ago.  10/21/2020 diagnostic mammogram and ultrasound confirmed presence of mass and a subsequent biopsy revealed right breast triple negative cancer. There was plan for patient to start with neoadjuvant chemotherapy followed by surgery and radiation.  Unfortunately patient never started treatment.  Patient was accompanied by her mother.  They want to transfer her oncology care to locally.  Patient reports having right breast pain as well as generalized pain.  Patient had a [name] placed 3 weeks ago and she reports some swelling around the sites.  Patient has had negative genetic testing at [facility].  Patient has a history of alcohol dependence.  Per patient she has not been drinking recently.  Everyday smoker.   02/24/2021, CT chest abdomen pelvis showed large right breast mass, 5.5 x 4.8 cm with small right axillary lymph node with variable degrees of enhancement potentially involved but not specific based on size.  This tracked down to retropectoral nodal stations largest lymph node along the lateral margin of the pectoralis major.  Tiny pulmonary nodules in the chest as discussed.  These are nonspecific but would consider short interval follow-up to assess for any changes.  No evidence of metastatic disease involving the  abdomen or pelvis.  Aortic atherosclerosis.  02/25/2021, echocardiogram showed LVEF 65 to 70%.  Patient has been to chemotherapy class and understands antiemetics instructions.  03/16/2021, bone scan showed no evidence of osseous metastatic breast cancer, asymmetric osseous uptake on the right at L5-S1, corresponding to degenerative disc disease on recent CT.  Asymmetric soft tissue uptake in the right breast.  INTERVAL HISTORY TAHARI CLABAUGH is a 61 y.o. female who has above history reviewed by me today presents for follow up visit for management of triple negative breast cancer, stage IIIb Patient is on neoadjuvant chemotherapy with carboplatin/Taxol/Keytruda. Grieving due to brother passed away recently   Review of Systems  Constitutional:  Positive for fatigue. Negative for appetite change, chills and fever.  HENT:   Negative for hearing loss, mouth sores and voice change.   Eyes:  Negative for eye problems.  Respiratory:  Positive for cough. Negative for chest tightness.   Cardiovascular:  Negative for chest pain.  Gastrointestinal:  Negative for abdominal distention, abdominal pain, blood in stool and nausea.  Endocrine: Negative for hot flashes.  Genitourinary:  Negative for difficulty urinating and frequency.   Musculoskeletal:  Negative for arthralgias.  Skin:  Negative for itching and rash.  Neurological:  Negative for extremity weakness.  Hematological:  Negative for adenopathy.  Psychiatric/Behavioral:  Negative for confusion. The patient is nervous/anxious.     MEDICAL HISTORY:  Past Medical History:  Diagnosis Date   Anxiety    Aortic atherosclerosis ([place]) 10/24/2016   Breast cancer ([place])    GERD (gastroesophageal reflux disease)     SURGICAL HISTORY: Past Surgical History:  Procedure Laterality Date   BREAST BIOPSY      SOCIAL HISTORY: Social History   Socioeconomic History   Marital status: Widowed    Spouse name: Not on file   Number of children: Not  on file   Years of education: Not on file   Highest education level: Not on file  Occupational History   Not on file  Tobacco Use   Smoking status: Every Day    Packs/day: 1.00    Years: 36.00    Pack years: 36.00    Types: Cigarettes    Start date: 03/15/1979   Smokeless tobacco: Never  Vaping Use   Vaping Use: Never used  Substance and Sexual Activity   Alcohol use: No    Alcohol/week: 3.0 standard drinks    Types: 3 Standard drinks or equivalent per week   Drug use: Not Currently   Sexual activity: Not Currently    Birth control/protection: None  Other Topics Concern   Not on file  Social History Narrative   ** Merged History Encounter **       Social Determinants of Health   Financial Resource Strain: Not on file  Food Insecurity: Not on file  Transportation Needs: Not on file  Physical Activity: Not on file  Stress: Not on file  Social Connections: Not on file  Intimate Partner Violence: Not on file    FAMILY HISTORY: Family History  Problem Relation Age of Onset   Cancer Mother 76       breast   Cancer Father        melonma   Cirrhosis Maternal Grandfather    Cancer Paternal Grandmother        skin/breast    ALLERGIES:  has no allergies on file.  MEDICATIONS:  Current Outpatient Medications  Medication Sig Dispense Refill   calcium-vitamin D (OSCAL WITH D) 500-5 MG-MCG tablet Take 2 tablets by mouth daily. 60 tablet 5   dexamethasone (DECADRON) 4 MG tablet TAKE 2 TABLETS BY MOUTH ONCE DAILY FOR 2 DAYS AFTER  CHEMOTHERAPY.  TAKE  WITH  FOOD. 60 tablet 0   fluconazole (DIFLUCAN) 150 MG tablet Take 1 tablet (150 mg total) by mouth See admin instructions. Take one tablet, and repeat the second dose in 3 days. 2 tablet 0   folic acid (FOLVITE) 1 MG tablet Take 1 tablet (1 mg total) by mouth daily. 30 tablet 0   gabapentin (NEURONTIN) 100 MG capsule Take 1 capsule (100 mg total) by mouth 3 (three) times daily. 90 capsule 0   guaiFENesin (MUCINEX) 600 MG 12  hr tablet Take 2 tablets (1,200 mg total) by mouth 2 (two) times daily. 60 tablet 0   ibuprofen (ADVIL) 200 MG tablet Take 200 mg by mouth every 6 (six) hours as needed for headache.     lidocaine-prilocaine (EMLA) cream Apply to affected area once 30 g 3   magic mouthwash (multi-ingredient) oral suspension Take 5 mLs by mouth 4 (four) times daily as needed for mouth pain. 480 mL 1   magnesium chloride (SLOW-MAG) 64 MG TBEC SR tablet Take 1 tablet (64 mg total) by mouth 2 (two) times daily. 60 tablet 2   Multiple Vitamin (MULTIVITAMIN WITH MINERALS) TABS tablet Take 1 tablet by mouth daily. 30 tablet 0   omeprazole (PRILOSEC) 20 MG capsule Take 20 mg by mouth daily as needed (reflux).     ondansetron (ZOFRAN) 8 MG tablet Take 1 tablet (8 mg total) by mouth 2 (two) times daily as  needed. Start on the third day after carboplatin and AC chemotherapy. 30 tablet 1   potassium chloride SA (KLOR-CON M) 20 MEQ tablet Take 1 tablet (20 mEq total) by mouth 2 (two) times daily. 60 tablet 1   prochlorperazine (COMPAZINE) 10 MG tablet Take 1 tablet (10 mg total) by mouth every 6 (six) hours as needed (Nausea or vomiting). 30 tablet 1   thiamine 100 MG tablet Take 0.5 tablets (50 mg total) by mouth daily. 15 tablet 0   No current facility-administered medications for this visit.     PHYSICAL EXAMINATION: ECOG PERFORMANCE STATUS: 1 - Symptomatic but completely ambulatory Vitals:   05/11/21 0853  BP: 129/75  Pulse: 97  Temp: (!) 97.2 F (36.2 C)   Filed Weights   05/11/21 0853  Weight: 110 lb (49.9 kg)    Physical Exam Constitutional:      General: She is not in acute distress. HENT:     Head: Normocephalic and atraumatic.  Eyes:     General: No scleral icterus. Cardiovascular:     Rate and Rhythm: Normal rate and regular rhythm.     Heart sounds: Normal heart sounds.  Pulmonary:     Effort: Pulmonary effort is normal. No respiratory distress.     Breath sounds: Wheezing present.   Abdominal:     General: Bowel sounds are normal. There is no distension.     Palpations: Abdomen is soft.  Musculoskeletal:        General: No deformity. Normal range of motion.     Cervical back: Normal range of motion and neck supple.  Skin:    General: Skin is warm and dry.     Findings: No erythema or rash.  Neurological:     Mental Status: She is alert and oriented to person, place, and time. Mental status is at baseline.     Cranial Nerves: No cranial nerve deficit.     Coordination: Coordination normal.  Psychiatric:     Comments: emotional    LABORATORY DATA:  I have reviewed the data as listed Lab Results  Component Value Date   WBC 7.6 05/04/2021   HGB 8.8 (L) 05/04/2021   HCT 25.8 (L) 05/04/2021   MCV 95.6 05/04/2021   PLT 302 05/04/2021   Recent Labs    04/20/21 0806 04/27/21 0833 05/04/21 0802  NA 124* 129* 130*  K 3.5 3.0* 3.6  CL 95* 96* 94*  CO2 23 25 28   GLUCOSE 83 116* 85  BUN 10 10 7*  CREATININE 0.63 0.67 0.56  CALCIUM 7.7* 8.1* 8.5*  GFRNONAA >60 >60 >60  PROT 6.0* 6.1* 6.4*  ALBUMIN 3.5 3.7 3.3*  AST 14* 19 13*  ALT 13 13 10   ALKPHOS 40 50 51  BILITOT 0.2* <0.1* 0.3    Iron/TIBC/Ferritin/ %Sat    Component Value Date/Time   IRON 80 10/24/2016 1534   TIBC 282 10/24/2016 1534   FERRITIN 282 (H) 11/17/2016 1404   IRONPCTSAT 28 10/24/2016 1534       RADIOGRAPHIC STUDIES: I have personally reviewed the radiological images as listed and agreed with the findings in the report. DG Chest 2 View  Result Date: 04/30/2021 CLINICAL DATA:  Cough and congestion, history of known right breast cancer EXAM: CHEST - 2 VIEW COMPARISON:  02/22/2020, 02/24/2021 FINDINGS: Normal heart size and vascularity. No focal pneumonia, collapse or consolidation. Negative for edema, effusion or pneumothorax. Trachea midline. Left IJ power port catheter tip SVC RA junction. No acute or significant osseous  finding. Slight scoliosis of the spine as before and  degenerative changes. Nonobstructive bowel gas pattern. IMPRESSION: Stable chest exam.  No active disease by plain radiography. Electronically Signed   By: Jerilynn Mages.  Shick M.D.   On: 04/30/2021 11:56   US BREAST LTD UNI RIGHT INC AXILLA  Result Date: 04/14/2021 CLINICAL DATA:  Known right breast cancer. Recent chest CT demonstrated possible right axillary lymphadenopathy. EXAM: DIGITAL DIAGNOSTIC UNILATERAL RIGHT MAMMOGRAM WITH TOMOSYNTHESIS AND CAD; ULTRASOUND RIGHT BREAST LIMITED TECHNIQUE: Right digital diagnostic mammography and breast tomosynthesis was performed. The images were evaluated with computer-aided detection.; Targeted ultrasound examination of the right breast was performed COMPARISON:  Previous exam(s). ACR Breast Density Category c: The breast tissue is heterogeneously dense, which may obscure small masses. FINDINGS: Mammographically, there is a large hyperdense mass in the right outer central breast measuring 4.9 x 4.8 x 5.4 cm mammographically. Post biopsy marker is located within this mass. Prominent spiculations from the mass extent to the pectoralis muscle. There is at least 1 enlarged right axillary lymph node. Targeted right breast ultrasound demonstrates 11 o'clock 4 cm from nipple large solid mass with central necrosis measuring 4.4 x 5.1 x 3.5 cm. Previous measurements on 10/21/2020 from the [facility] are 5.2 x 4.3 x 4.9 cm. There is 1 borderline abnormal lymph node in the right axilla with cortical thickening of 3 mm. IMPRESSION: Mostly stable in size but with prominence internal necrosis right breast malignancy. One borderline abnormal right axillary lymph node. RECOMMENDATION: Continue with plan of care for known right breast cancer. I have discussed the findings and recommendations with the patient. If applicable, a reminder letter will be sent to the patient regarding the next appointment. BI-RADS CATEGORY  6: Known biopsy-proven malignancy. Electronically Signed   By: Fidela Salisbury M.D.   On: 04/14/2021 10:06  MM DIAG BREAST TOMO UNI RIGHT  Result Date: 04/14/2021 CLINICAL DATA:  Known right breast cancer. Recent chest CT demonstrated possible right axillary lymphadenopathy. EXAM: DIGITAL DIAGNOSTIC UNILATERAL RIGHT MAMMOGRAM WITH TOMOSYNTHESIS AND CAD; ULTRASOUND RIGHT BREAST LIMITED TECHNIQUE: Right digital diagnostic mammography and breast tomosynthesis was performed. The images were evaluated with computer-aided detection.; Targeted ultrasound examination of the right breast was performed COMPARISON:  Previous exam(s). ACR Breast Density Category c: The breast tissue is heterogeneously dense, which may obscure small masses. FINDINGS: Mammographically, there is a large hyperdense mass in the right outer central breast measuring 4.9 x 4.8 x 5.4 cm mammographically. Post biopsy marker is located within this mass. Prominent spiculations from the mass extent to the pectoralis muscle. There is at least 1 enlarged right axillary lymph node. Targeted right breast ultrasound demonstrates 11 o'clock 4 cm from nipple large solid mass with central necrosis measuring 4.4 x 5.1 x 3.5 cm. Previous measurements on 10/21/2020 from the [facility] are 5.2 x 4.3 x 4.9 cm. There is 1 borderline abnormal lymph node in the right axilla with cortical thickening of 3 mm. IMPRESSION: Mostly stable in size but with prominence internal necrosis right breast malignancy. One borderline abnormal right axillary lymph node. RECOMMENDATION: Continue with plan of care for known right breast cancer. I have discussed the findings and recommendations with the patient. If applicable, a reminder letter will be sent to the patient regarding the next appointment. BI-RADS CATEGORY  6: Known biopsy-proven malignancy. Electronically Signed   By: Fidela Salisbury M.D.   On: 04/14/2021 10:06     ASSESSMENT & PLAN:  1. Invasive ductal carcinoma of right breast ([place])  2. Encounter for antineoplastic  chemotherapy   3. Hypomagnesemia   4. Neoplasm related pain   5. Hypokalemia   6. Anxiety   7. Grief reaction    Cancer Staging  Invasive ductal carcinoma of right breast [facility]) Staging form: Breast, AJCC 8th Edition - Clinical stage from 02/19/2021: Stage IIIB (cT3, cN0, cM0, G2, ER-, PR-, HER2-) - Signed by Earlie Server, MD on 02/20/2021  #Clinical stage III triple negative right breast cancer. cT3 cN0-1 Baseline elevated CA 15-3 and CA 27.29. Right diagnostic mammogram and ultrasound were done and the patient does have a small right axillary lymph node.-Recommend biopsy-radiology has contacted patient multiple times not able to reach her to schedule biopsy. Labs reviewed and discussed with patient. Proceed with cycle 3 day 8 Taxol.   # Hypokalemia, continue potassium chloride 22mq BID.  IV potassium chloride 278m x 1  # Hypomagnesia, 1.3,  she will receive IV mag sulfate 4g x 1.  Continue Slow-Mag increase to 64 mg TID.. Marland Kitchen#Generalized body ache, negative bone scan.  Symptom has improved. Continue gabapentin refilled at 100 mg daily prescription..  Continue follow-up with palliative care service.  # Grief  # anxiety, she agrees with try Ativan 0.5 mg Q12h PRN anxiety  All questions were answered. The patient knows to call the clinic with any problems questions or concerns.  cc BoEvern Bio, NP    Return of visit:   Follow  up in 1 week for lab md Taxol   ZhEarlie ServerMD, PhD  05/11/2021

## 2021-05-18 ENCOUNTER — Inpatient Hospital Stay: Payer: Medicaid Other

## 2021-05-18 ENCOUNTER — Inpatient Hospital Stay: Payer: Medicaid Other | Attending: Oncology

## 2021-05-18 ENCOUNTER — Other Ambulatory Visit: Payer: Self-pay

## 2021-05-18 ENCOUNTER — Inpatient Hospital Stay (HOSPITAL_BASED_OUTPATIENT_CLINIC_OR_DEPARTMENT_OTHER): Payer: Medicaid Other | Admitting: Oncology

## 2021-05-18 ENCOUNTER — Encounter: Payer: Self-pay | Admitting: Oncology

## 2021-05-18 VITALS — BP 127/79 | HR 97 | Temp 97.0°F | Wt 108.0 lb

## 2021-05-18 DIAGNOSIS — E876 Hypokalemia: Secondary | ICD-10-CM | POA: Diagnosis not present

## 2021-05-18 DIAGNOSIS — D6481 Anemia due to antineoplastic chemotherapy: Secondary | ICD-10-CM | POA: Insufficient documentation

## 2021-05-18 DIAGNOSIS — R197 Diarrhea, unspecified: Secondary | ICD-10-CM | POA: Insufficient documentation

## 2021-05-18 DIAGNOSIS — Z171 Estrogen receptor negative status [ER-]: Secondary | ICD-10-CM | POA: Insufficient documentation

## 2021-05-18 DIAGNOSIS — C50911 Malignant neoplasm of unspecified site of right female breast: Secondary | ICD-10-CM

## 2021-05-18 DIAGNOSIS — F419 Anxiety disorder, unspecified: Secondary | ICD-10-CM

## 2021-05-18 DIAGNOSIS — Z5111 Encounter for antineoplastic chemotherapy: Secondary | ICD-10-CM

## 2021-05-18 DIAGNOSIS — F4321 Adjustment disorder with depressed mood: Secondary | ICD-10-CM

## 2021-05-18 DIAGNOSIS — Z79899 Other long term (current) drug therapy: Secondary | ICD-10-CM | POA: Insufficient documentation

## 2021-05-18 DIAGNOSIS — D6959 Other secondary thrombocytopenia: Secondary | ICD-10-CM | POA: Insufficient documentation

## 2021-05-18 DIAGNOSIS — Z5112 Encounter for antineoplastic immunotherapy: Secondary | ICD-10-CM | POA: Diagnosis not present

## 2021-05-18 LAB — CBC WITH DIFFERENTIAL/PLATELET
Abs Immature Granulocytes: 0.01 10*3/uL (ref 0.00–0.07)
Basophils Absolute: 0 10*3/uL (ref 0.0–0.1)
Basophils Relative: 0 %
Eosinophils Absolute: 0 10*3/uL (ref 0.0–0.5)
Eosinophils Relative: 0 %
HCT: 27.4 % — ABNORMAL LOW (ref 36.0–46.0)
Hemoglobin: 9.3 g/dL — ABNORMAL LOW (ref 12.0–15.0)
Immature Granulocytes: 0 %
Lymphocytes Relative: 64 %
Lymphs Abs: 2.7 10*3/uL (ref 0.7–4.0)
MCH: 33.6 pg (ref 26.0–34.0)
MCHC: 33.9 g/dL (ref 30.0–36.0)
MCV: 98.9 fL (ref 80.0–100.0)
Monocytes Absolute: 0.3 10*3/uL (ref 0.1–1.0)
Monocytes Relative: 6 %
Neutro Abs: 1.3 10*3/uL — ABNORMAL LOW (ref 1.7–7.7)
Neutrophils Relative %: 30 %
Platelets: 201 10*3/uL (ref 150–400)
RBC: 2.77 MIL/uL — ABNORMAL LOW (ref 3.87–5.11)
RDW: 21.2 % — ABNORMAL HIGH (ref 11.5–15.5)
WBC: 4.3 10*3/uL (ref 4.0–10.5)
nRBC: 0 % (ref 0.0–0.2)

## 2021-05-18 LAB — COMPREHENSIVE METABOLIC PANEL
ALT: 11 U/L (ref 0–44)
AST: 16 U/L (ref 15–41)
Albumin: 3.7 g/dL (ref 3.5–5.0)
Alkaline Phosphatase: 50 U/L (ref 38–126)
Anion gap: 8 (ref 5–15)
BUN: <5 mg/dL — ABNORMAL LOW (ref 8–23)
CO2: 25 mmol/L (ref 22–32)
Calcium: 9.1 mg/dL (ref 8.9–10.3)
Chloride: 100 mmol/L (ref 98–111)
Creatinine, Ser: 0.58 mg/dL (ref 0.44–1.00)
GFR, Estimated: >60 mL/min (ref >60)
Glucose, Bld: 86 mg/dL (ref 70–99)
Potassium: 4 mmol/L (ref 3.5–5.1)
Sodium: 133 mmol/L — ABNORMAL LOW (ref 135–145)
Total Bilirubin: 0.3 mg/dL (ref 0.3–1.2)
Total Protein: 6.6 g/dL (ref 6.5–8.1)

## 2021-05-18 LAB — MAGNESIUM: Magnesium: 1.7 mg/dL (ref 1.7–2.4)

## 2021-05-18 MED ORDER — HEPARIN SOD (PORK) LOCK FLUSH 100 UNIT/ML IV SOLN
500.0000 [IU] | Freq: Once | INTRAVENOUS | Status: AC | PRN
  Filled 2021-05-18: qty 5

## 2021-05-18 MED ORDER — DIPHENHYDRAMINE HCL 50 MG/ML IJ SOLN
50.0000 mg | Freq: Once | INTRAMUSCULAR | Status: AC
  Administered 2021-05-18: 50 mg via INTRAVENOUS
  Filled 2021-05-18: qty 1

## 2021-05-18 MED ORDER — SODIUM CHLORIDE 0.9 % IV SOLN
Freq: Once | INTRAVENOUS | Status: AC
  Filled 2021-05-18: qty 250

## 2021-05-18 MED ORDER — HEPARIN SOD (PORK) LOCK FLUSH 100 UNIT/ML IV SOLN
INTRAVENOUS | Status: AC
  Administered 2021-05-18: 500 [IU]
  Filled 2021-05-18: qty 5

## 2021-05-18 MED ORDER — SODIUM CHLORIDE 0.9 % IV SOLN
10.0000 mg | Freq: Once | INTRAVENOUS | Status: AC
  Administered 2021-05-18: 10 mg via INTRAVENOUS
  Filled 2021-05-18: qty 10

## 2021-05-18 MED ORDER — SODIUM CHLORIDE 0.9% FLUSH
10.0000 mL | Freq: Once | INTRAVENOUS | Status: AC
  Administered 2021-05-18: 10 mL via INTRAVENOUS
  Filled 2021-05-18: qty 10

## 2021-05-18 MED ORDER — SODIUM CHLORIDE 0.9 % IV SOLN
80.0000 mg/m2 | Freq: Once | INTRAVENOUS | Status: AC
  Administered 2021-05-18: 120 mg via INTRAVENOUS
  Filled 2021-05-18: qty 20

## 2021-05-18 MED ORDER — LOPERAMIDE HCL 2 MG PO CAPS: 2.0000 mg | ORAL_CAPSULE | ORAL | 1 refills | Status: DC

## 2021-05-18 MED ORDER — FAMOTIDINE IN NACL 20-0.9 MG/50ML-% IV SOLN
20.0000 mg | Freq: Once | INTRAVENOUS | Status: AC
  Administered 2021-05-18: 20 mg via INTRAVENOUS
  Filled 2021-05-18: qty 50

## 2021-05-18 NOTE — Progress Notes (Signed)
°Hematology/Oncology Progress note °[phone] [phone] °  ° °   ° ° °Patient Care Team: °Boswell, Chelsa H, NP as PCP - General (Nurse Practitioner) ° °REFERRING PROVIDER: °Boswell, Chelsa H, NP  °CHIEF COMPLAINTS/REASON FOR VISIT:  °Follow-up for triple negative right breast cancer. ° °HISTORY OF PRESENTING ILLNESS:  ° °Yvonne Scott is a  60 y.o.  female with PMH listed below was seen in consultation at the request of  Boswell, Chelsa H, NP  for evaluation of breast cancer. ° °August 2022, patient was diagnosed with right breast stage IIIb cT3 N0M0, grade 2, ER negative, PR negative HER2 negative breast cancer.  Patient self palpated breast mass many years ago.  10/21/2020 diagnostic mammogram and ultrasound confirmed presence of mass and a subsequent biopsy revealed right breast triple negative cancer. °There was plan for patient to start with neoadjuvant chemotherapy followed by surgery and radiation.  Unfortunately patient never started treatment.  Patient was accompanied by her mother.  They want to transfer her oncology care to locally. ° °Patient reports having right breast pain as well as generalized pain.  Patient had a [name] placed 3 weeks ago and she reports some swelling around the sites.  Patient has had negative genetic testing at [facility]. ° °Patient has a history of alcohol dependence.  Per patient she has not been drinking recently.  Everyday smoker. ° ° °02/24/2021, CT chest abdomen pelvis showed large right breast mass, 5.5 x 4.8 cm with small right axillary lymph node with variable degrees of enhancement potentially involved but not specific based on size.  This tracked down to retropectoral nodal stations largest lymph node along the lateral margin of the pectoralis major.  Tiny pulmonary nodules in the chest as discussed.  These are nonspecific but would consider short interval follow-up to assess for any changes.  No evidence of metastatic disease involving the  abdomen or pelvis.  Aortic atherosclerosis. ° °02/25/2021, echocardiogram showed LVEF 65 to 70%.  Patient has been to chemotherapy class and understands antiemetics instructions. ° °03/16/2021, bone scan showed no evidence of osseous metastatic breast cancer, asymmetric osseous uptake on the right at L5-S1, corresponding to degenerative disc disease on recent CT.  Asymmetric soft tissue uptake in the right breast. ° °INTERVAL HISTORY °Yvonne Scott is a 60 y.o. female who has above history reviewed by me today presents for follow up visit for management of triple negative breast cancer, stage IIIb °Patient is on neoadjuvant chemotherapy with carboplatin/Taxol/Keytruda. °Patient reports feeling tired.  Still grieving. ° °Review of Systems  °Constitutional:  Positive for fatigue. Negative for appetite change, chills and fever.  °HENT:   Negative for hearing loss, mouth sores and voice change.   °Eyes:  Negative for eye problems.  °Respiratory:  Negative for chest tightness and cough.   °Cardiovascular:  Negative for chest pain.  °Gastrointestinal:  Negative for abdominal distention, abdominal pain, blood in stool and nausea.  °Endocrine: Negative for hot flashes.  °Genitourinary:  Negative for difficulty urinating and frequency.   °Musculoskeletal:  Negative for arthralgias.  °Skin:  Negative for itching and rash.  °Neurological:  Negative for extremity weakness.  °Hematological:  Negative for adenopathy.  °Psychiatric/Behavioral:  Negative for confusion. The patient is nervous/anxious.   ° ° °MEDICAL HISTORY:  °Past Medical History:  °Diagnosis Date  ° Anxiety   ° Aortic atherosclerosis (HCC) 10/24/2016  ° Breast cancer (HCC)   ° GERD (gastroesophageal reflux disease)   ° ° °SURGICAL HISTORY: °Past Surgical History:  °Procedure   Laterality Date  ° BREAST BIOPSY    ° ° °SOCIAL HISTORY: °Social History  ° °Socioeconomic History  ° Marital status: Widowed  °  Spouse name: Not on file  ° Number of children: Not on file   ° Years of education: Not on file  ° Highest education level: Not on file  °Occupational History  ° Not on file  °Tobacco Use  ° Smoking status: Every Day  °  Packs/day: 1.00  °  Years: 36.00  °  Pack years: 36.00  °  Types: Cigarettes  °  Start date: 03/15/1979  ° Smokeless tobacco: Never  °Vaping Use  ° Vaping Use: Never used  °Substance and Sexual Activity  ° Alcohol use: No  °  Alcohol/week: 3.0 standard drinks  °  Types: 3 Standard drinks or equivalent per week  ° Drug use: Not Currently  ° Sexual activity: Not Currently  °  Birth control/protection: None  °Other Topics Concern  ° Not on file  °Social History Narrative  ° ** Merged History Encounter **  °    ° °Social Determinants of Health  ° °Financial Resource Strain: Not on file  °Food Insecurity: Not on file  °Transportation Needs: Not on file  °Physical Activity: Not on file  °Stress: Not on file  °Social Connections: Not on file  °Intimate Partner Violence: Not on file  ° ° °FAMILY HISTORY: °Family History  °Problem Relation Age of Onset  ° Cancer Mother 30  °     breast  ° Cancer Father   °     melonma  ° Cirrhosis Maternal Grandfather   ° Cancer Paternal Grandmother   °     skin/breast  ° ° °ALLERGIES:  has no allergies on file. ° °MEDICATIONS:  °Current Outpatient Medications  °Medication Sig Dispense Refill  ° calcium-vitamin D (OSCAL WITH D) 500-5 MG-MCG tablet Take 2 tablets by mouth daily. 60 tablet 5  ° dexamethasone (DECADRON) 4 MG tablet TAKE 2 TABLETS BY MOUTH ONCE DAILY FOR 2 DAYS AFTER  CHEMOTHERAPY.  TAKE  WITH  FOOD. 60 tablet 0  ° fluconazole (DIFLUCAN) 150 MG tablet Take 1 tablet (150 mg total) by mouth See admin instructions. Take one tablet, and repeat the second dose in 3 days. 2 tablet 0  ° folic acid (FOLVITE) 1 MG tablet Take 1 tablet (1 mg total) by mouth daily. 30 tablet 0  ° gabapentin (NEURONTIN) 100 MG capsule Take 1 capsule (100 mg total) by mouth 3 (three) times daily. 90 capsule 0  ° guaiFENesin (MUCINEX) 600 MG 12 hr tablet  Take 2 tablets (1,200 mg total) by mouth 2 (two) times daily. 60 tablet 0  ° ibuprofen (ADVIL) 200 MG tablet Take 200 mg by mouth every 6 (six) hours as needed for headache.    ° lidocaine-prilocaine (EMLA) cream Apply to affected area once 30 g 3  ° loperamide (IMODIUM) 2 MG capsule Take 1 capsule (2 mg total) by mouth See admin instructions. Initial: 4 mg, followed by 2 mg after each loose stool; maximum: 16 mg/day 90 capsule 1  ° LORazepam (ATIVAN) 0.5 MG tablet Take 1 tablet (0.5 mg total) by mouth every 12 (twelve) hours as needed for anxiety (nausea). 15 tablet 0  ° magic mouthwash (multi-ingredient) oral suspension Take 5 mLs by mouth 4 (four) times daily as needed for mouth pain. 480 mL 1  ° magnesium chloride (SLOW-MAG) 64 MG TBEC SR tablet Take 1 tablet (64 mg total) by mouth 2 (two)   times daily. 60 tablet 2  ° Multiple Vitamin (MULTIVITAMIN WITH MINERALS) TABS tablet Take 1 tablet by mouth daily. 30 tablet 0  ° omeprazole (PRILOSEC) 20 MG capsule Take 20 mg by mouth daily as needed (reflux).    ° ondansetron (ZOFRAN) 8 MG tablet Take 1 tablet (8 mg total) by mouth 2 (two) times daily as needed. Start on the third day after carboplatin and AC chemotherapy. 30 tablet 1  ° potassium chloride SA (KLOR-CON M) 20 MEQ tablet Take 1 tablet (20 mEq total) by mouth 2 (two) times daily. 60 tablet 1  ° prochlorperazine (COMPAZINE) 10 MG tablet Take 1 tablet (10 mg total) by mouth every 6 (six) hours as needed (Nausea or vomiting). 30 tablet 1  ° thiamine 100 MG tablet Take 0.5 tablets (50 mg total) by mouth daily. 15 tablet 0  ° VENTOLIN HFA 108 (90 Base) MCG/ACT inhaler Inhale 2 puffs into the lungs 4 (four) times daily as needed.    ° °No current facility-administered medications for this visit.  ° ° ° °PHYSICAL EXAMINATION: °ECOG PERFORMANCE STATUS: 1 - Symptomatic but completely ambulatory °Vitals:  ° 05/18/21 0841  °BP: 127/79  °Pulse: 97  °Temp: (!) 97 °F (36.1 °C)  ° °Filed Weights  ° 05/18/21 0841  °Weight:  108 lb (49 kg)  ° ° °Physical Exam °Constitutional:   °   General: She is not in acute distress. °HENT:  °   Head: Normocephalic and atraumatic.  °Eyes:  °   General: No scleral icterus. °Cardiovascular:  °   Rate and Rhythm: Normal rate and regular rhythm.  °   Heart sounds: Normal heart sounds.  °Pulmonary:  °   Effort: Pulmonary effort is normal. No respiratory distress.  °   Breath sounds: No wheezing.  °Abdominal:  °   General: Bowel sounds are normal. There is no distension.  °   Palpations: Abdomen is soft.  °Musculoskeletal:     °   General: No deformity. Normal range of motion.  °   Cervical back: Normal range of motion and neck supple.  °Skin: °   General: Skin is warm and dry.  °   Findings: No erythema or rash.  °Neurological:  °   Mental Status: She is alert and oriented to person, place, and time. Mental status is at baseline.  °   Cranial Nerves: No cranial nerve deficit.  °   Coordination: Coordination normal.  °Psychiatric:  °   Comments: emotional  ° ° °LABORATORY DATA:  °I have reviewed the data as listed °Lab Results  °Component Value Date  ° WBC 4.3 05/18/2021  ° HGB 9.3 (L) 05/18/2021  ° HCT 27.4 (L) 05/18/2021  ° MCV 98.9 05/18/2021  ° PLT 201 05/18/2021  ° °Recent Labs  °  05/04/21 °0802 05/11/21 °0818 05/18/21 °0831  °NA 130* 134* 133*  °K 3.6 3.2* 4.0  °CL 94* 99 100  °CO2 28 23 25  °GLUCOSE 85 90 86  °BUN 7* 11 <5*  °CREATININE 0.56 0.66 0.58  °CALCIUM 8.5* 8.9 9.1  °GFRNONAA >60 >60 >60  °PROT 6.4* 6.4* 6.6  °ALBUMIN 3.3* 3.6 3.7  °AST 13* 23 16  °ALT 10 13 11  °ALKPHOS 51 54 50  °BILITOT 0.3 <0.1* 0.3  ° ° °Iron/TIBC/Ferritin/ %Sat °   °Component Value Date/Time  ° IRON 80 10/24/2016 1534  ° TIBC 282 10/24/2016 1534  ° FERRITIN 282 (H) 11/17/2016 1404  ° IRONPCTSAT 28 10/24/2016 1534  ° °  ° ° °  RADIOGRAPHIC STUDIES: °I have personally reviewed the radiological images as listed and agreed with the findings in the report. °DG Chest 2 View ° °Result Date: 04/30/2021 °CLINICAL DATA:  Cough  and congestion, history of known right breast cancer EXAM: CHEST - 2 VIEW COMPARISON:  02/22/2020, 02/24/2021 FINDINGS: Normal heart size and vascularity. No focal pneumonia, collapse or consolidation. Negative for edema, effusion or pneumothorax. Trachea midline. Left IJ power port catheter tip SVC RA junction. No acute or significant osseous finding. Slight scoliosis of the spine as before and degenerative changes. Nonobstructive bowel gas pattern. IMPRESSION: Stable chest exam.  No active disease by plain radiography. Electronically Signed   By: M.  Shick M.D.   On: 04/30/2021 11:56   ° ° ° °ASSESSMENT & PLAN:  °1. Invasive ductal carcinoma of right breast (HCC)   °2. Encounter for antineoplastic chemotherapy   °3. Hypomagnesemia   °4. Hypokalemia   °5. Anxiety   °6. Grief reaction   ° Cancer Staging  °Invasive ductal carcinoma of right breast (HCC) °Staging form: Breast, AJCC 8th Edition °- Clinical stage from 02/19/2021: Stage IIIB (cT3, cN0, cM0, G2, ER-, PR-, HER2-) - Signed by Yu, Zhou, MD on 02/20/2021 ° °#Clinical stage III triple negative right breast cancer. cT3 cN0-1 °Baseline elevated CA 15-3 and CA 27.29. °Right diagnostic mammogram and ultrasound were done and the patient does have a small right axillary lymph node.-Recommend biopsy-radiology has contacted patient multiple times not able to reach her to schedule biopsy-discussed with patient again and she agrees to call radiology to schedule her biopsy. °Labs are reviewed and discussed with patient °Proceed with cycle 3 day 15 Taxol with D 16/17 GCSF  ° °# Hypokalemia, continue potassium chloride 20meq BID.   ° °# Hypomagnesia, 1.7,    Continue Slow-Mag increase to 64 mg TID.. ° °#Generalized body ache, negative bone scan.  Symptom has improved. °Continue gabapentin refilled at 100 mg daily prescription..  Continue follow-up with palliative care service. ° °# Grief  °# anxiety, she agrees with try Ativan 0.5 mg Q12h PRN anxiety ° °All questions were  answered. The patient knows to call the clinic with any problems questions or concerns. ° °cc °Boswell, Chelsa H, NP  ° ° °Return of visit:  ° °Follow  up in 1 week for lab NP carboplatin/Taxol/ Keytruda.  °2 weeks lab MD Taxol  ° °Zhou Yu, MD, PhD ° °05/18/2021 °  °

## 2021-05-18 NOTE — Progress Notes (Signed)
Patient here follow up. Patient states she is feeling unsteady.  ?

## 2021-05-18 NOTE — Patient Instructions (Signed)
[facility]  Discharge Instructions: Thank you for choosing [facility] to provide your oncology and hematology care.  If you have a lab appointment with the Cancer Center, please go directly to the Cancer Center and check in at the registration area.  Wear comfortable clothing and clothing appropriate for easy access to any Portacath or PICC line.   We strive to give you quality time with your provider. You may need to reschedule your appointment if you arrive late (15 or more minutes).  Arriving late affects you and other patients whose appointments are after yours.  Also, if you miss three or more appointments without notifying the office, you may be dismissed from the clinic at the provider's discretion.      For prescription refill requests, have your pharmacy contact our office and allow 72 hours for refills to be completed.    Today you received the following chemotherapy and/or immunotherapy agents Taxol      To help prevent nausea and vomiting after your treatment, we encourage you to take your nausea medication as directed.  BELOW ARE SYMPTOMS THAT SHOULD BE REPORTED IMMEDIATELY: *FEVER GREATER THAN 100.4 F (38 C) OR HIGHER *CHILLS OR SWEATING *NAUSEA AND VOMITING THAT IS NOT CONTROLLED WITH YOUR NAUSEA MEDICATION *UNUSUAL SHORTNESS OF BREATH *UNUSUAL BRUISING OR BLEEDING *URINARY PROBLEMS (pain or burning when urinating, or frequent urination) *BOWEL PROBLEMS (unusual diarrhea, constipation, pain near the anus) TENDERNESS IN MOUTH AND THROAT WITH OR WITHOUT PRESENCE OF ULCERS (sore throat, sores in mouth, or a toothache) UNUSUAL RASH, SWELLING OR PAIN  UNUSUAL VAGINAL DISCHARGE OR ITCHING   Items with * indicate a potential emergency and should be followed up as soon as possible or go to the Emergency Department if any problems should occur.  Please show the CHEMOTHERAPY ALERT CARD or IMMUNOTHERAPY ALERT CARD at check-in to the  Emergency Department and triage nurse.  Should you have questions after your visit or need to cancel or reschedule your appointment, please contact [facility]  [phone] and follow the prompts.  Office hours are 8:00 a.m. to 4:30 p.m. Monday - Friday. Please note that voicemails left after 4:00 p.m. may not be returned until the following business day.  We are closed weekends and major holidays. You have access to a nurse at all times for urgent questions. Please call the main number to the clinic [phone] and follow the prompts.  For any non-urgent questions, you may also contact your provider using MyChart. We now offer e-Visits for anyone 18 and older to request care online for non-urgent symptoms. For details visit [email or web address].   Also download the MyChart app! Go to the app store, search "MyChart", open the app, select [facility], and log in with your MyChart username and password.  Due to Covid, a mask is required upon entering the hospital/clinic. If you do not have a mask, one will be given to you upon arrival. For doctor visits, patients may have 1 support person aged 18 or older with them. For treatment visits, patients cannot have anyone with them due to current Covid guidelines and our immunocompromised population.  

## 2021-05-19 ENCOUNTER — Inpatient Hospital Stay: Payer: Medicaid Other

## 2021-05-19 DIAGNOSIS — Z5112 Encounter for antineoplastic immunotherapy: Secondary | ICD-10-CM | POA: Diagnosis not present

## 2021-05-19 DIAGNOSIS — C50911 Malignant neoplasm of unspecified site of right female breast: Secondary | ICD-10-CM

## 2021-05-19 MED ORDER — FILGRASTIM-AAFI 300 MCG/0.5ML IJ SOSY
300.0000 ug | PREFILLED_SYRINGE | Freq: Once | INTRAMUSCULAR | Status: AC
  Administered 2021-05-19: 300 ug via SUBCUTANEOUS
  Filled 2021-05-19: qty 0.5

## 2021-05-20 ENCOUNTER — Other Ambulatory Visit: Payer: Self-pay

## 2021-05-20 ENCOUNTER — Inpatient Hospital Stay: Payer: Medicaid Other

## 2021-05-20 DIAGNOSIS — Z5112 Encounter for antineoplastic immunotherapy: Secondary | ICD-10-CM | POA: Diagnosis not present

## 2021-05-20 DIAGNOSIS — C50911 Malignant neoplasm of unspecified site of right female breast: Secondary | ICD-10-CM

## 2021-05-20 MED ORDER — FILGRASTIM-AAFI 300 MCG/0.5ML IJ SOSY
300.0000 ug | PREFILLED_SYRINGE | Freq: Once | INTRAMUSCULAR | Status: AC
  Administered 2021-05-20: 15:00:00 300 ug via SUBCUTANEOUS
  Filled 2021-05-20: qty 0.5

## 2021-05-21 ENCOUNTER — Encounter: Payer: Self-pay | Admitting: Oncology

## 2021-05-21 NOTE — Addendum Note (Signed)
Addended by: Earlie Server on: 05/21/2021 09:41 PM ? ? Modules accepted: Orders ? ?

## 2021-05-25 ENCOUNTER — Inpatient Hospital Stay (HOSPITAL_BASED_OUTPATIENT_CLINIC_OR_DEPARTMENT_OTHER): Payer: Medicaid Other | Admitting: Oncology

## 2021-05-25 ENCOUNTER — Other Ambulatory Visit: Payer: Self-pay

## 2021-05-25 ENCOUNTER — Inpatient Hospital Stay: Payer: Medicaid Other

## 2021-05-25 ENCOUNTER — Encounter: Payer: Self-pay | Admitting: Oncology

## 2021-05-25 VITALS — BP 102/62 | HR 94 | Temp 98.4°F | Resp 14 | Ht 61.5 in | Wt 107.3 lb

## 2021-05-25 DIAGNOSIS — Z5112 Encounter for antineoplastic immunotherapy: Secondary | ICD-10-CM | POA: Diagnosis not present

## 2021-05-25 DIAGNOSIS — C50911 Malignant neoplasm of unspecified site of right female breast: Secondary | ICD-10-CM

## 2021-05-25 DIAGNOSIS — R197 Diarrhea, unspecified: Secondary | ICD-10-CM | POA: Diagnosis not present

## 2021-05-25 LAB — COMPREHENSIVE METABOLIC PANEL
ALT: 8 U/L (ref 0–44)
AST: 12 U/L — ABNORMAL LOW (ref 15–41)
Albumin: 3.5 g/dL (ref 3.5–5.0)
Alkaline Phosphatase: 60 U/L (ref 38–126)
Anion gap: 7 (ref 5–15)
BUN: <5 mg/dL — ABNORMAL LOW (ref 8–23)
CO2: 21 mmol/L — ABNORMAL LOW (ref 22–32)
Calcium: 8.6 mg/dL — ABNORMAL LOW (ref 8.9–10.3)
Chloride: 104 mmol/L (ref 98–111)
Creatinine, Ser: 0.63 mg/dL (ref 0.44–1.00)
GFR, Estimated: >60 mL/min (ref >60)
Glucose, Bld: 92 mg/dL (ref 70–99)
Potassium: 4 mmol/L (ref 3.5–5.1)
Sodium: 132 mmol/L — ABNORMAL LOW (ref 135–145)
Total Bilirubin: <0.1 mg/dL — ABNORMAL LOW (ref 0.3–1.2)
Total Protein: 6.6 g/dL (ref 6.5–8.1)

## 2021-05-25 LAB — CBC WITH DIFFERENTIAL/PLATELET
Abs Immature Granulocytes: 0.07 10*3/uL (ref 0.00–0.07)
Basophils Absolute: 0 10*3/uL (ref 0.0–0.1)
Basophils Relative: 1 %
Eosinophils Absolute: 0 10*3/uL (ref 0.0–0.5)
Eosinophils Relative: 0 %
HCT: 26.4 % — ABNORMAL LOW (ref 36.0–46.0)
Hemoglobin: 8.7 g/dL — ABNORMAL LOW (ref 12.0–15.0)
Immature Granulocytes: 1 %
Lymphocytes Relative: 46 %
Lymphs Abs: 2.6 10*3/uL (ref 0.7–4.0)
MCH: 33.3 pg (ref 26.0–34.0)
MCHC: 33 g/dL (ref 30.0–36.0)
MCV: 101.1 fL — ABNORMAL HIGH (ref 80.0–100.0)
Monocytes Absolute: 0.5 10*3/uL (ref 0.1–1.0)
Monocytes Relative: 9 %
Neutro Abs: 2.4 10*3/uL (ref 1.7–7.7)
Neutrophils Relative %: 43 %
Platelets: 127 10*3/uL — ABNORMAL LOW (ref 150–400)
RBC: 2.61 MIL/uL — ABNORMAL LOW (ref 3.87–5.11)
RDW: 22.2 % — ABNORMAL HIGH (ref 11.5–15.5)
WBC: 5.5 10*3/uL (ref 4.0–10.5)
nRBC: 0 % (ref 0.0–0.2)

## 2021-05-25 LAB — MAGNESIUM: Magnesium: 1.7 mg/dL (ref 1.7–2.4)

## 2021-05-25 MED ORDER — HEPARIN SOD (PORK) LOCK FLUSH 100 UNIT/ML IV SOLN
INTRAVENOUS | Status: AC
  Administered 2021-05-25: 500 [IU]
  Filled 2021-05-25: qty 5

## 2021-05-25 MED ORDER — HEPARIN SOD (PORK) LOCK FLUSH 100 UNIT/ML IV SOLN
500.0000 [IU] | Freq: Once | INTRAVENOUS | Status: AC | PRN
  Filled 2021-05-25: qty 5

## 2021-05-25 MED ORDER — FAMOTIDINE IN NACL 20-0.9 MG/50ML-% IV SOLN
20.0000 mg | Freq: Once | INTRAVENOUS | Status: AC
  Administered 2021-05-25: 20 mg via INTRAVENOUS
  Filled 2021-05-25: qty 50

## 2021-05-25 MED ORDER — DIPHENHYDRAMINE HCL 50 MG/ML IJ SOLN
50.0000 mg | Freq: Once | INTRAMUSCULAR | Status: AC
  Administered 2021-05-25: 50 mg via INTRAVENOUS
  Filled 2021-05-25: qty 1

## 2021-05-25 MED ORDER — SODIUM CHLORIDE 0.9 % IV SOLN
200.0000 mg | Freq: Once | INTRAVENOUS | Status: AC
  Administered 2021-05-25: 200 mg via INTRAVENOUS
  Filled 2021-05-25: qty 200

## 2021-05-25 MED ORDER — SODIUM CHLORIDE 0.9 % IV SOLN
10.0000 mg | Freq: Once | INTRAVENOUS | Status: AC
  Administered 2021-05-25: 10 mg via INTRAVENOUS
  Filled 2021-05-25: qty 10

## 2021-05-25 MED ORDER — SODIUM CHLORIDE 0.9 % IV SOLN
416.5000 mg | Freq: Once | INTRAVENOUS | Status: AC
  Administered 2021-05-25: 420 mg via INTRAVENOUS
  Filled 2021-05-25: qty 42

## 2021-05-25 MED ORDER — PALONOSETRON HCL INJECTION 0.25 MG/5ML
0.2500 mg | Freq: Once | INTRAVENOUS | Status: AC
  Administered 2021-05-25: 0.25 mg via INTRAVENOUS
  Filled 2021-05-25: qty 5

## 2021-05-25 MED ORDER — SODIUM CHLORIDE 0.9 % IV SOLN
80.0000 mg/m2 | Freq: Once | INTRAVENOUS | Status: AC
  Administered 2021-05-25: 120 mg via INTRAVENOUS
  Filled 2021-05-25: qty 20

## 2021-05-25 MED ORDER — SODIUM CHLORIDE 0.9 % IV SOLN
150.0000 mg | Freq: Once | INTRAVENOUS | Status: AC
  Administered 2021-05-25: 150 mg via INTRAVENOUS
  Filled 2021-05-25: qty 150

## 2021-05-25 MED ORDER — SODIUM CHLORIDE 0.9 % IV SOLN
Freq: Once | INTRAVENOUS | Status: AC
  Filled 2021-05-25: qty 250

## 2021-05-25 MED ORDER — SODIUM CHLORIDE 0.9% FLUSH
10.0000 mL | INTRAVENOUS | Status: DC | PRN
  Administered 2021-05-25: 10 mL
  Filled 2021-05-25: qty 10

## 2021-05-25 MED ORDER — DIPHENOXYLATE-ATROPINE 2.5-0.025 MG PO TABS: 1.0000 | ORAL_TABLET | Freq: Four times a day (QID) | ORAL | 0 refills | Status: DC | PRN

## 2021-05-25 NOTE — Patient Instructions (Signed)
[facility]   °Discharge Instructions: °Thank you for choosing [facility] to provide your oncology and hematology care.  °If you have a lab appointment with the Cancer Center, please go directly to the Cancer Center and check in at the registration area. °  °Wear comfortable clothing and clothing appropriate for easy access to any Portacath or PICC line.  ° °We strive to give you quality time with your provider. You may need to reschedule your appointment if you arrive late (15 or more minutes).  Arriving late affects you and other patients whose appointments are after yours.  Also, if you miss three or more appointments without notifying the office, you may be dismissed from the clinic at the provider’s discretion.    °  °For prescription refill requests, have your pharmacy contact our office and allow 72 hours for refills to be completed.   ° °Today you received the following chemotherapy and/or immunotherapy agents: Keytruda, Taxol, and Carboplatin.    °  °To help prevent nausea and vomiting after your treatment, we encourage you to take your nausea medication as directed. ° °BELOW ARE SYMPTOMS THAT SHOULD BE REPORTED IMMEDIATELY: °*FEVER GREATER THAN 100.4 F (38 °C) OR HIGHER °*CHILLS OR SWEATING °*NAUSEA AND VOMITING THAT IS NOT CONTROLLED WITH YOUR NAUSEA MEDICATION °*UNUSUAL SHORTNESS OF BREATH °*UNUSUAL BRUISING OR BLEEDING °*URINARY PROBLEMS (pain or burning when urinating, or frequent urination) °*BOWEL PROBLEMS (unusual diarrhea, constipation, pain near the anus) °TENDERNESS IN MOUTH AND THROAT WITH OR WITHOUT PRESENCE OF ULCERS (sore throat, sores in mouth, or a toothache) °UNUSUAL RASH, SWELLING OR PAIN  °UNUSUAL VAGINAL DISCHARGE OR ITCHING  ° °Items with * indicate a potential emergency and should be followed up as soon as possible or go to the Emergency Department if any problems should occur. ° °Please show the CHEMOTHERAPY ALERT CARD or IMMUNOTHERAPY  ALERT CARD at check-in to the Emergency Department and triage nurse. ° °Should you have questions after your visit or need to cancel or reschedule your appointment, please contact [facility]  Dept: [phone]  and follow the prompts.  Office hours are 8:00 a.m. to 4:30 p.m. Monday - Friday. Please note that voicemails left after 4:00 p.m. may not be returned until the following business day.  We are closed weekends and major holidays. You have access to a nurse at all times for urgent questions. Please call the main number to the clinic Dept: [phone] and follow the prompts. ° °For any non-urgent questions, you may also contact your provider using MyChart. We now offer e-Visits for anyone 62 and older to request care online for non-urgent symptoms. For details visit [email or web address]. °  °Also download the MyChart app! Go to the app store, search "MyChart", open the app, select [facility], and log in with your MyChart username and password. ° °Due to Covid, a mask is required upon entering the hospital/clinic. If you do not have a mask, one will be given to you upon arrival. For doctor visits, patients may have 1 support person aged 18 or older with them. For treatment visits, patients cannot have anyone with them due to current Covid guidelines and our immunocompromised population.  °

## 2021-05-25 NOTE — Progress Notes (Signed)
?Hematology/Oncology Progress note ?[phone] [phone] ?  ? ?   ? ? ?Patient Care Team: ?[name], NP as PCP - General (Nurse Practitioner) ? ?REFERRING PROVIDER: ?[name], NP  ?CHIEF COMPLAINTS/REASON FOR VISIT:  ?Follow-up for triple negative right breast cancer. ? ?HISTORY OF PRESENTING ILLNESS:  ?Yvonne Scott is a  61 y.o.  female with PMH listed below was seen in consultation at the request of  [name], NP  for evaluation of breast cancer. ? ?August 2022, patient was diagnosed with right breast stage IIIb cT3 N0M0, grade 2, ER negative, PR negative HER2 negative breast cancer.  Patient self palpated breast mass many years ago.  10/21/2020 diagnostic mammogram and ultrasound confirmed presence of mass and a subsequent biopsy revealed right breast triple negative cancer. ?There was plan for patient to start with neoadjuvant chemotherapy followed by surgery and radiation.  Unfortunately patient never started treatment.  Patient was accompanied by her mother.  They want to transfer her oncology care to locally. ? ?Patient reports having right breast pain as well as generalized pain.  Patient had a [name] placed 3 weeks ago and she reports some swelling around the sites.  Patient has had negative genetic testing at [facility]. ? ?Patient has a history of alcohol dependence.  Per patient she has not been drinking recently.  Everyday smoker. ? ? ?02/24/2021, CT chest abdomen pelvis showed large right breast mass, 5.5 x 4.8 cm with small right axillary lymph node with variable degrees of enhancement potentially involved but not specific based on size.  This tracked down to retropectoral nodal stations largest lymph node along the lateral margin of the pectoralis major.  Tiny pulmonary nodules in the chest as discussed.  These are nonspecific but would consider short interval follow-up to assess for any changes.  No evidence of metastatic disease involving the abdomen  or pelvis.  Aortic atherosclerosis. ? ?02/25/2021, echocardiogram showed LVEF 65 to 70%.  Patient has been to chemotherapy class and understands antiemetics instructions. ? ?03/16/2021, bone scan showed no evidence of osseous metastatic breast cancer, asymmetric osseous uptake on the right at L5-S1, corresponding to degenerative disc disease on recent CT.  Asymmetric soft tissue uptake in the right breast. ? ?INTERVAL HISTORY ?[name] is a 61 year old female who is here for chemotherapy for triple negative breast cancer.  Continues to complain of diarrhea intermittently after chemotherapy.  States she had several episodes last night.  Uses Imodium.  States other medication she was given has not been helping.  Appetite is fair.  Drinking fluids.  Has some mild tingling sensation in her hands and feet and also some skin sensitivity.  Has been using lotion daily.  Denies any leg pain today. ? ?Review of Systems  ?Constitutional:  Positive for fatigue.  ?Gastrointestinal:  Positive for diarrhea.  ?Musculoskeletal:  Positive for myalgias.  ?Neurological:  Positive for numbness.  ?Psychiatric/Behavioral:  The patient is nervous/anxious.   ? ? ?MEDICAL HISTORY:  ?Past Medical History:  ?Diagnosis Date  ? Anxiety   ? Aortic atherosclerosis ([place]) 10/24/2016  ? Breast cancer ([place])   ? GERD (gastroesophageal reflux disease)   ? ? ?SURGICAL HISTORY: ?Past Surgical History:  ?Procedure Laterality Date  ? BREAST BIOPSY    ? ? ?SOCIAL HISTORY: ?Social History  ? ?Socioeconomic History  ? Marital status: Widowed  ?  Spouse name: Not on file  ? Number of children: Not on file  ? Years of education: Not on file  ?  Highest education level: Not on file  ?Occupational History  ? Not on file  ?Tobacco Use  ? Smoking status: Every Day  ?  Packs/day: 1.00  ?  Years: 36.00  ?  Pack years: 36.00  ?  Types: Cigarettes  ?  Start date: 03/15/1979  ? Smokeless tobacco: Never  ?Vaping Use  ? Vaping Use: Never used  ?Substance and Sexual Activity   ? Alcohol use: No  ?  Alcohol/week: 3.0 standard drinks  ?  Types: 3 Standard drinks or equivalent per week  ? Drug use: Not Currently  ? Sexual activity: Not Currently  ?  Birth control/protection: None  ?Other Topics Concern  ? Not on file  ?Social History Narrative  ? ** Merged History Encounter **  ?    ? ?Social Determinants of Health  ? ?Financial Resource Strain: Not on file  ?Food Insecurity: Not on file  ?Transportation Needs: Not on file  ?Physical Activity: Not on file  ?Stress: Not on file  ?Social Connections: Not on file  ?Intimate Partner Violence: Not on file  ? ? ?FAMILY HISTORY: ?Family History  ?Problem Relation Age of Onset  ? Cancer Mother 36  ?     breast  ? Cancer Father   ?     melonma  ? Cirrhosis Maternal Grandfather   ? Cancer Paternal Grandmother   ?     skin/breast  ? ? ?ALLERGIES:  has no allergies on file. ? ?MEDICATIONS:  ?Current Outpatient Medications  ?Medication Sig Dispense Refill  ? calcium-vitamin D (OSCAL WITH D) 500-5 MG-MCG tablet Take 2 tablets by mouth daily. 60 tablet 5  ? dexamethasone (DECADRON) 4 MG tablet TAKE 2 TABLETS BY MOUTH ONCE DAILY FOR 2 DAYS AFTER  CHEMOTHERAPY.  TAKE  WITH  FOOD. 60 tablet 0  ? diphenoxylate-atropine (LOMOTIL) 2.5-0.025 MG tablet Take 1 tablet by mouth 4 (four) times daily as needed for diarrhea or loose stools. 30 tablet 0  ? fluconazole (DIFLUCAN) 150 MG tablet Take 1 tablet (150 mg total) by mouth See admin instructions. Take one tablet, and repeat the second dose in 3 days. 2 tablet 0  ? folic acid (FOLVITE) 1 MG tablet Take 1 tablet (1 mg total) by mouth daily. 30 tablet 0  ? gabapentin (NEURONTIN) 100 MG capsule Take 1 capsule (100 mg total) by mouth 3 (three) times daily. 90 capsule 0  ? guaiFENesin (MUCINEX) 600 MG 12 hr tablet Take 2 tablets (1,200 mg total) by mouth 2 (two) times daily. 60 tablet 0  ? ibuprofen (ADVIL) 200 MG tablet Take 200 mg by mouth every 6 (six) hours as needed for headache.    ? lidocaine-prilocaine (EMLA)  cream Apply to affected area once 30 g 3  ? loperamide (IMODIUM) 2 MG capsule Take 1 capsule (2 mg total) by mouth See admin instructions. Initial: 4 mg, followed by 2 mg after each loose stool; maximum: 16 mg/day 90 capsule 1  ? LORazepam (ATIVAN) 0.5 MG tablet Take 1 tablet (0.5 mg total) by mouth every 12 (twelve) hours as needed for anxiety (nausea). 15 tablet 0  ? magic mouthwash (multi-ingredient) oral suspension Take 5 mLs by mouth 4 (four) times daily as needed for mouth pain. 480 mL 1  ? magnesium chloride (SLOW-MAG) 64 MG TBEC SR tablet Take 1 tablet (64 mg total) by mouth 2 (two) times daily. 60 tablet 2  ? Multiple Vitamin (MULTIVITAMIN WITH MINERALS) TABS tablet Take 1 tablet by mouth daily. 30 tablet 0  ?  omeprazole (PRILOSEC) 20 MG capsule Take 20 mg by mouth daily as needed (reflux).    ? ondansetron (ZOFRAN) 8 MG tablet Take 1 tablet (8 mg total) by mouth 2 (two) times daily as needed. Start on the third day after carboplatin and AC chemotherapy. 30 tablet 1  ? potassium chloride (MICRO-K) 10 MEQ CR capsule Take 10 mEq by mouth daily.    ? potassium chloride SA (KLOR-CON M) 20 MEQ tablet Take 1 tablet (20 mEq total) by mouth 2 (two) times daily. 60 tablet 1  ? prochlorperazine (COMPAZINE) 10 MG tablet Take 1 tablet (10 mg total) by mouth every 6 (six) hours as needed (Nausea or vomiting). 30 tablet 1  ? thiamine 100 MG tablet Take 0.5 tablets (50 mg total) by mouth daily. 15 tablet 0  ? VENTOLIN HFA 108 (90 Base) MCG/ACT inhaler Inhale 2 puffs into the lungs 4 (four) times daily as needed.    ? ?No current facility-administered medications for this visit.  ? ? ? ?PHYSICAL EXAMINATION: ?ECOG PERFORMANCE STATUS: 1 - Symptomatic but completely ambulatory ?Vitals:  ? 05/25/21 0847  ?BP: 102/62  ?Pulse: 94  ?Resp: 14  ?Temp: 98.4 ?F (36.9 ?C)  ?SpO2: 95%  ? ?Filed Weights  ? 05/25/21 0847  ?Weight: 107 lb 4.8 oz (48.7 kg)  ? ? ?Physical Exam ?Constitutional:   ?   Appearance: Normal appearance.  ?HENT:   ?   Head: Normocephalic and atraumatic.  ?Eyes:  ?   Pupils: Pupils are equal, round, and reactive to light.  ?Cardiovascular:  ?   Rate and Rhythm: Normal rate and regular rhythm.  ?   Heart sounds:

## 2021-05-25 NOTE — Progress Notes (Signed)
Pt states she has been dealing with diarrhea and congestion for the past two weeks. As well as, expereicing numbness and tingling at the bottom of her feet which becomes painful.  ?

## 2021-05-25 NOTE — Progress Notes (Signed)
Nutrition Follow-up: ? ? ?Patient with triple negative breast cancer.  Patient receiving taxol, carboplatin and keytruda.   ? ?Met with patient during infusion.  Reports diarrhea.  She says she thinks ensure shakes make diarrhea worse. Patient also reports taste alterations, except can taste salty foods.   ? ?Medications: new Rx for lomotil sent today ? ?Labs: reviewed ? ?Anthropometrics:  ? ?Weight 107 lb 4.8 oz on 3/14 ? ?109 lb 14.4 oz on 2/7 ?109 lb 8 oz on 1/10 ?138 lb on 02/2020 ? ? ?NUTRITION DIAGNOSIS: Inadequate oral intake continues ? ? ?INTERVENTION:  ?Encouraged patient to take antidiarrhea medication and call clinic if not working. ?Discussed foods to choose with diarrhea. Handout provided ?Reviewed ways to help with taste change.  Provided patient with recipe for mouth rinse per patient request ?  ? ?MONITORING, EVALUATION, GOAL: weight trends, intake ? ? ?NEXT VISIT: to be determined with treatment ? ?Raeli Wiens B. Zenia Resides, RD, LDN ?Registered Dietitian ?[phone] (mobile) ? ? ?

## 2021-06-01 ENCOUNTER — Inpatient Hospital Stay (HOSPITAL_BASED_OUTPATIENT_CLINIC_OR_DEPARTMENT_OTHER): Payer: Medicaid Other | Admitting: Hospice and Palliative Medicine

## 2021-06-01 ENCOUNTER — Inpatient Hospital Stay: Payer: Medicaid Other

## 2021-06-01 ENCOUNTER — Other Ambulatory Visit: Payer: Self-pay

## 2021-06-01 ENCOUNTER — Encounter: Payer: Self-pay | Admitting: Oncology

## 2021-06-01 ENCOUNTER — Inpatient Hospital Stay (HOSPITAL_BASED_OUTPATIENT_CLINIC_OR_DEPARTMENT_OTHER): Payer: Medicaid Other | Admitting: Oncology

## 2021-06-01 VITALS — BP 109/80 | HR 105 | Temp 98.4°F | Resp 18 | Wt 102.3 lb

## 2021-06-01 DIAGNOSIS — E876 Hypokalemia: Secondary | ICD-10-CM

## 2021-06-01 DIAGNOSIS — C50911 Malignant neoplasm of unspecified site of right female breast: Secondary | ICD-10-CM

## 2021-06-01 DIAGNOSIS — Z5111 Encounter for antineoplastic chemotherapy: Secondary | ICD-10-CM

## 2021-06-01 DIAGNOSIS — K521 Toxic gastroenteritis and colitis: Secondary | ICD-10-CM | POA: Diagnosis not present

## 2021-06-01 DIAGNOSIS — F419 Anxiety disorder, unspecified: Secondary | ICD-10-CM

## 2021-06-01 DIAGNOSIS — T451X5A Adverse effect of antineoplastic and immunosuppressive drugs, initial encounter: Secondary | ICD-10-CM

## 2021-06-01 DIAGNOSIS — F4321 Adjustment disorder with depressed mood: Secondary | ICD-10-CM

## 2021-06-01 DIAGNOSIS — Z5112 Encounter for antineoplastic immunotherapy: Secondary | ICD-10-CM | POA: Diagnosis not present

## 2021-06-01 LAB — CBC WITH DIFFERENTIAL/PLATELET
Abs Immature Granulocytes: 0.04 10*3/uL (ref 0.00–0.07)
Basophils Absolute: 0 10*3/uL (ref 0.0–0.1)
Basophils Relative: 1 %
Eosinophils Absolute: 0 10*3/uL (ref 0.0–0.5)
Eosinophils Relative: 1 %
HCT: 26.3 % — ABNORMAL LOW (ref 36.0–46.0)
Hemoglobin: 9 g/dL — ABNORMAL LOW (ref 12.0–15.0)
Immature Granulocytes: 1 %
Lymphocytes Relative: 46 %
Lymphs Abs: 1.6 10*3/uL (ref 0.7–4.0)
MCH: 34 pg (ref 26.0–34.0)
MCHC: 34.2 g/dL (ref 30.0–36.0)
MCV: 99.2 fL (ref 80.0–100.0)
Monocytes Absolute: 0.3 10*3/uL (ref 0.1–1.0)
Monocytes Relative: 7 %
Neutro Abs: 1.5 10*3/uL — ABNORMAL LOW (ref 1.7–7.7)
Neutrophils Relative %: 44 %
Platelets: 301 10*3/uL (ref 150–400)
RBC: 2.65 MIL/uL — ABNORMAL LOW (ref 3.87–5.11)
RDW: 20.3 % — ABNORMAL HIGH (ref 11.5–15.5)
WBC: 3.5 10*3/uL — ABNORMAL LOW (ref 4.0–10.5)
nRBC: 0 % (ref 0.0–0.2)

## 2021-06-01 LAB — COMPREHENSIVE METABOLIC PANEL
ALT: 9 U/L (ref 0–44)
AST: 18 U/L (ref 15–41)
Albumin: 3.8 g/dL (ref 3.5–5.0)
Alkaline Phosphatase: 66 U/L (ref 38–126)
Anion gap: 14 (ref 5–15)
BUN: 21 mg/dL (ref 8–23)
CO2: 21 mmol/L — ABNORMAL LOW (ref 22–32)
Calcium: 9.3 mg/dL (ref 8.9–10.3)
Chloride: 95 mmol/L — ABNORMAL LOW (ref 98–111)
Creatinine, Ser: 0.78 mg/dL (ref 0.44–1.00)
GFR, Estimated: >60 mL/min (ref >60)
Glucose, Bld: 125 mg/dL — ABNORMAL HIGH (ref 70–99)
Potassium: 3.3 mmol/L — ABNORMAL LOW (ref 3.5–5.1)
Sodium: 130 mmol/L — ABNORMAL LOW (ref 135–145)
Total Bilirubin: 0.5 mg/dL (ref 0.3–1.2)
Total Protein: 7.2 g/dL (ref 6.5–8.1)

## 2021-06-01 LAB — MAGNESIUM: Magnesium: 1.4 mg/dL — ABNORMAL LOW (ref 1.7–2.4)

## 2021-06-01 MED ORDER — DEXAMETHASONE 4 MG PO TABS: ORAL_TABLET | ORAL | 0 refills | Status: DC

## 2021-06-01 MED ORDER — MAGNESIUM SULFATE 4 GM/100ML IV SOLN
4.0000 g | Freq: Once | INTRAVENOUS | Status: AC
  Administered 2021-06-01: 4 g via INTRAVENOUS
  Filled 2021-06-01: qty 100

## 2021-06-01 MED ORDER — POTASSIUM CHLORIDE IN NACL 20-0.9 MEQ/L-% IV SOLN
Freq: Once | INTRAVENOUS | Status: AC
  Filled 2021-06-01: qty 1000

## 2021-06-01 MED ORDER — HEPARIN SOD (PORK) LOCK FLUSH 100 UNIT/ML IV SOLN
500.0000 [IU] | Freq: Once | INTRAVENOUS | Status: AC
  Administered 2021-06-01: 500 [IU] via INTRAVENOUS
  Filled 2021-06-01: qty 5

## 2021-06-01 NOTE — Progress Notes (Signed)
?Hematology/Oncology Progress note ?Telephone:(336) [phone] [phone] ?  ? ?   ? ? ?Patient Care Team: ?[name], NP as PCP - General (Nurse Practitioner) ? ?REFERRING PROVIDER: ?[name], NP  ?CHIEF COMPLAINTS/REASON FOR VISIT:  ?Follow-up for triple negative right breast cancer. ? ?HISTORY OF PRESENTING ILLNESS:  ? ?Yvonne Scott is a  61 y.o.  female with PMH listed below was seen in consultation at the request of  [name], NP  for evaluation of breast cancer. ? ?August 2022, patient was diagnosed with right breast stage IIIb cT3 N0M0, grade 2, ER negative, PR negative HER2 negative breast cancer.  Patient self palpated breast mass many years ago.  10/21/2020 diagnostic mammogram and ultrasound confirmed presence of mass and a subsequent biopsy revealed right breast triple negative cancer. ?There was plan for patient to start with neoadjuvant chemotherapy followed by surgery and radiation.  Unfortunately patient never started treatment.  Patient was accompanied by her mother.  They want to transfer her oncology care to locally. ? ?Patient reports having right breast pain as well as generalized pain.  Patient had a [name] placed 3 weeks ago and she reports some swelling around the sites.  Patient has had negative genetic testing at [facility]. ? ?Patient has a history of alcohol dependence.  Per patient she has not been drinking recently.  Everyday smoker. ? ? ?02/24/2021, CT chest abdomen pelvis showed large right breast mass, 5.5 x 4.8 cm with small right axillary lymph node with variable degrees of enhancement potentially involved but not specific based on size.  This tracked down to retropectoral nodal stations largest lymph node along the lateral margin of the pectoralis major.  Tiny pulmonary nodules in the chest as discussed.  These are nonspecific but would consider short interval follow-up to assess for any changes.  No evidence of metastatic disease involving the  abdomen or pelvis.  Aortic atherosclerosis. ? ?02/25/2021, echocardiogram showed LVEF 65 to 70%.  Patient has been to chemotherapy class and understands antiemetics instructions. ? ?03/16/2021, bone scan showed no evidence of osseous metastatic breast cancer, asymmetric osseous uptake on the right at L5-S1, corresponding to degenerative disc disease on recent CT.  Asymmetric soft tissue uptake in the right breast. ? ?INTERVAL HISTORY ?[name] is a 61 y.o. female who has above history reviewed by me today presents for follow up visit for management of triple negative breast cancer, stage IIIb ?Patient is on neoadjuvant chemotherapy with carboplatin/Taxol/Keytruda.  Patient was seen by NP provider Faythe Casa last week and had cycle 4-day 1 carboplatin/Taxol/Keytruda treatment. ?Patient reports worsening of her diarrhea.  Onset of diarrhea was about 2 weeks ago.  Getting worse after last week's treatment. ?She feels weak today.  No nausea vomiting, fever or chills.  She had 4-6 loose bowel movements per day. ? ?Review of Systems  ?Constitutional:  Positive for fatigue. Negative for appetite change, chills and fever.  ?HENT:   Negative for hearing loss, mouth sores and voice change.   ?Eyes:  Negative for eye problems.  ?Respiratory:  Negative for chest tightness and cough.   ?Cardiovascular:  Negative for chest pain.  ?Gastrointestinal:  Negative for abdominal distention, abdominal pain, blood in stool and nausea.  ?Endocrine: Negative for hot flashes.  ?Genitourinary:  Negative for difficulty urinating and frequency.   ?Musculoskeletal:  Negative for arthralgias.  ?Skin:  Negative for itching and rash.  ?Neurological:  Negative for extremity weakness.  ?Hematological:  Negative for adenopathy.  ?Psychiatric/Behavioral:  Negative for confusion. The patient is nervous/anxious.   ? ? ?MEDICAL HISTORY:  ?Past Medical History:  ?Diagnosis Date  ? Anxiety   ? Aortic atherosclerosis ([place]) 10/24/2016  ? Breast  cancer ([place])   ? GERD (gastroesophageal reflux disease)   ? ? ?SURGICAL HISTORY: ?Past Surgical History:  ?Procedure Laterality Date  ? BREAST BIOPSY    ? ? ?SOCIAL HISTORY: ?Social History  ? ?Socioeconomic History  ? Marital status: Widowed  ?  Spouse name: Not on file  ? Number of children: Not on file  ? Years of education: Not on file  ? Highest education level: Not on file  ?Occupational History  ? Not on file  ?Tobacco Use  ? Smoking status: Every Day  ?  Packs/day: 1.00  ?  Years: 36.00  ?  Pack years: 36.00  ?  Types: Cigarettes  ?  Start date: 03/15/1979  ? Smokeless tobacco: Never  ?Vaping Use  ? Vaping Use: Never used  ?Substance and Sexual Activity  ? Alcohol use: No  ?  Alcohol/week: 3.0 standard drinks  ?  Types: 3 Standard drinks or equivalent per week  ? Drug use: Not Currently  ? Sexual activity: Not Currently  ?  Birth control/protection: None  ?Other Topics Concern  ? Not on file  ?Social History Narrative  ? ** Merged History Encounter **  ?    ? ?Social Determinants of Health  ? ?Financial Resource Strain: Not on file  ?Food Insecurity: Not on file  ?Transportation Needs: Not on file  ?Physical Activity: Not on file  ?Stress: Not on file  ?Social Connections: Not on file  ?Intimate Partner Violence: Not on file  ? ? ?FAMILY HISTORY: ?Family History  ?Problem Relation Age of Onset  ? Cancer Mother 50  ?     breast  ? Cancer Father   ?     melonma  ? Cirrhosis Maternal Grandfather   ? Cancer Paternal Grandmother   ?     skin/breast  ? ? ?ALLERGIES:  has no allergies on file. ? ?MEDICATIONS:  ?Current Outpatient Medications  ?Medication Sig Dispense Refill  ? calcium-vitamin D (OSCAL WITH D) 500-5 MG-MCG tablet Take 2 tablets by mouth daily. 60 tablet 5  ? diphenoxylate-atropine (LOMOTIL) 2.5-0.025 MG tablet Take 1 tablet by mouth 4 (four) times daily as needed for diarrhea or loose stools. 30 tablet 0  ? folic acid (FOLVITE) 1 MG tablet Take 1 tablet (1 mg total) by mouth daily. 30 tablet 0  ?  gabapentin (NEURONTIN) 100 MG capsule Take 1 capsule (100 mg total) by mouth 3 (three) times daily. 90 capsule 0  ? guaiFENesin (MUCINEX) 600 MG 12 hr tablet Take 2 tablets (1,200 mg total) by mouth 2 (two) times daily. 60 tablet 0  ? ibuprofen (ADVIL) 200 MG tablet Take 200 mg by mouth every 6 (six) hours as needed for headache.    ? lidocaine-prilocaine (EMLA) cream Apply to affected area once 30 g 3  ? loperamide (IMODIUM) 2 MG capsule Take 1 capsule (2 mg total) by mouth See admin instructions. Initial: 4 mg, followed by 2 mg after each loose stool; maximum: 16 mg/day 90 capsule 1  ? LORazepam (ATIVAN) 0.5 MG tablet Take 1 tablet (0.5 mg total) by mouth every 12 (twelve) hours as needed for anxiety (nausea). 15 tablet 0  ? magic mouthwash (multi-ingredient) oral suspension Take 5 mLs by mouth 4 (four) times daily as needed for mouth pain. 480 mL 1  ?  magnesium chloride (SLOW-MAG) 64 MG TBEC SR tablet Take 1 tablet (64 mg total) by mouth 2 (two) times daily. 60 tablet 2  ? Multiple Vitamin (MULTIVITAMIN WITH MINERALS) TABS tablet Take 1 tablet by mouth daily. 30 tablet 0  ? omeprazole (PRILOSEC) 20 MG capsule Take 20 mg by mouth daily as needed (reflux).    ? ondansetron (ZOFRAN) 8 MG tablet Take 1 tablet (8 mg total) by mouth 2 (two) times daily as needed. Start on the third day after carboplatin and AC chemotherapy. 30 tablet 1  ? potassium chloride SA (KLOR-CON M) 20 MEQ tablet Take 1 tablet (20 mEq total) by mouth 2 (two) times daily. 60 tablet 1  ? prochlorperazine (COMPAZINE) 10 MG tablet Take 1 tablet (10 mg total) by mouth every 6 (six) hours as needed (Nausea or vomiting). 30 tablet 1  ? thiamine 100 MG tablet Take 0.5 tablets (50 mg total) by mouth daily. 15 tablet 0  ? VENTOLIN HFA 108 (90 Base) MCG/ACT inhaler Inhale 2 puffs into the lungs 4 (four) times daily as needed.    ? dexamethasone (DECADRON) 4 MG tablet TAKE 2 TABLETS BY MOUTH ONCE DAILY FOR 2 DAYS AFTER  CHEMOTHERAPY.  TAKE  WITH  FOOD. 15  tablet 0  ? fluconazole (DIFLUCAN) 150 MG tablet Take 1 tablet (150 mg total) by mouth See admin instructions. Take one tablet, and repeat the second dose in 3 days. (Patient not taking: Reported on 06/01/2021) 2 ta

## 2021-06-01 NOTE — Progress Notes (Signed)
Patient here for follow up. Pt reports she has been having diarrhea since her last visit.  ?

## 2021-06-01 NOTE — Progress Notes (Signed)
Virtual Visit via Telephone Note ? ?I connected with Yvonne Scott on 06/01/21 at  2:00 PM EDT by telephone and verified that I am speaking with the correct person using two identifiers. ? ?Location: ?Patient: Home ?Provider: Clinic ?  ?I discussed the limitations, risks, security and privacy concerns of performing an evaluation and management service by telephone and the availability of in person appointments. I also discussed with the patient that there may be a patient responsible charge related to this service. The patient expressed understanding and agreed to proceed. ? ? ?History of Present Illness: ?Yvonne Scott is a 61 y.o. female with multiple medical problems including alcohol dependence, tobacco use, and stage IIIb triple negative right breast cancer.  Plan is for neoadjuvant chemotherapy/immunotherapy.  Patient has had breast pain and was referred to palliative care for symptom management. ?  ?Observations/Objective: ?I spoke with patient by phone to follow-up regarding pain management.  Patient reports generalized discomfort since she last received chemotherapy.  Patient saw Dr. Tasia Catchings earlier today and received IV fluids.  Chemo is being held until symptoms improve.  Patient request refill of dexamethasone as she is felt better on this in the past.  She denies other symptomatic complaints or concerns. ? ?Assessment and Plan: ?Breast pain -continue gabapentin.  Refill dexamethasone ? ?Follow Up Instructions: ?Follow-up telephone visit 1-2 months ?  ?I discussed the assessment and treatment plan with the patient. The patient was provided an opportunity to ask questions and all were answered. The patient agreed with the plan and demonstrated an understanding of the instructions. ?  ?The patient was advised to call back or seek an in-person evaluation if the symptoms worsen or if the condition fails to improve as anticipated. ? ?I provided 5 minutes of non-face-to-face time during this  encounter. ? ? ?Irean Hong, NP ? ? ?

## 2021-06-08 ENCOUNTER — Inpatient Hospital Stay (HOSPITAL_BASED_OUTPATIENT_CLINIC_OR_DEPARTMENT_OTHER): Payer: Medicaid Other | Admitting: Oncology

## 2021-06-08 ENCOUNTER — Inpatient Hospital Stay: Payer: Medicaid Other

## 2021-06-08 ENCOUNTER — Encounter: Payer: Self-pay | Admitting: Oncology

## 2021-06-08 ENCOUNTER — Other Ambulatory Visit: Payer: Self-pay

## 2021-06-08 VITALS — BP 110/71 | HR 97 | Temp 97.7°F | Wt 104.0 lb

## 2021-06-08 DIAGNOSIS — Z5112 Encounter for antineoplastic immunotherapy: Secondary | ICD-10-CM | POA: Diagnosis not present

## 2021-06-08 DIAGNOSIS — Z5111 Encounter for antineoplastic chemotherapy: Secondary | ICD-10-CM

## 2021-06-08 DIAGNOSIS — R52 Pain, unspecified: Secondary | ICD-10-CM

## 2021-06-08 DIAGNOSIS — F419 Anxiety disorder, unspecified: Secondary | ICD-10-CM

## 2021-06-08 DIAGNOSIS — C50911 Malignant neoplasm of unspecified site of right female breast: Secondary | ICD-10-CM | POA: Diagnosis not present

## 2021-06-08 DIAGNOSIS — T451X5A Adverse effect of antineoplastic and immunosuppressive drugs, initial encounter: Secondary | ICD-10-CM

## 2021-06-08 DIAGNOSIS — E876 Hypokalemia: Secondary | ICD-10-CM | POA: Diagnosis not present

## 2021-06-08 DIAGNOSIS — K521 Toxic gastroenteritis and colitis: Secondary | ICD-10-CM

## 2021-06-08 DIAGNOSIS — Z95828 Presence of other vascular implants and grafts: Secondary | ICD-10-CM

## 2021-06-08 DIAGNOSIS — G620 Drug-induced polyneuropathy: Secondary | ICD-10-CM | POA: Insufficient documentation

## 2021-06-08 DIAGNOSIS — G893 Neoplasm related pain (acute) (chronic): Secondary | ICD-10-CM

## 2021-06-08 LAB — MAGNESIUM: Magnesium: 1.7 mg/dL (ref 1.7–2.4)

## 2021-06-08 LAB — COMPREHENSIVE METABOLIC PANEL
ALT: 9 U/L (ref 0–44)
AST: 17 U/L (ref 15–41)
Albumin: 3.6 g/dL (ref 3.5–5.0)
Alkaline Phosphatase: 63 U/L (ref 38–126)
Anion gap: 10 (ref 5–15)
BUN: 9 mg/dL (ref 8–23)
CO2: 25 mmol/L (ref 22–32)
Calcium: 9.1 mg/dL (ref 8.9–10.3)
Chloride: 100 mmol/L (ref 98–111)
Creatinine, Ser: 0.85 mg/dL (ref 0.44–1.00)
GFR, Estimated: >60 mL/min (ref >60)
Glucose, Bld: 91 mg/dL (ref 70–99)
Potassium: 3.9 mmol/L (ref 3.5–5.1)
Sodium: 135 mmol/L (ref 135–145)
Total Bilirubin: 0.1 mg/dL — ABNORMAL LOW (ref 0.3–1.2)
Total Protein: 6.6 g/dL (ref 6.5–8.1)

## 2021-06-08 LAB — CBC WITH DIFFERENTIAL/PLATELET
Abs Immature Granulocytes: 0.72 10*3/uL — ABNORMAL HIGH (ref 0.00–0.07)
Basophils Absolute: 0 10*3/uL (ref 0.0–0.1)
Basophils Relative: 0 %
Eosinophils Absolute: 0 10*3/uL (ref 0.0–0.5)
Eosinophils Relative: 0 %
HCT: 27.6 % — ABNORMAL LOW (ref 36.0–46.0)
Hemoglobin: 9.2 g/dL — ABNORMAL LOW (ref 12.0–15.0)
Immature Granulocytes: 6 %
Lymphocytes Relative: 50 %
Lymphs Abs: 5.5 10*3/uL — ABNORMAL HIGH (ref 0.7–4.0)
MCH: 34.5 pg — ABNORMAL HIGH (ref 26.0–34.0)
MCHC: 33.3 g/dL (ref 30.0–36.0)
MCV: 103.4 fL — ABNORMAL HIGH (ref 80.0–100.0)
Monocytes Absolute: 1.4 10*3/uL — ABNORMAL HIGH (ref 0.1–1.0)
Monocytes Relative: 12 %
Neutro Abs: 3.5 10*3/uL (ref 1.7–7.7)
Neutrophils Relative %: 32 %
Platelets: 393 10*3/uL (ref 150–400)
RBC: 2.67 MIL/uL — ABNORMAL LOW (ref 3.87–5.11)
RDW: 22.1 % — ABNORMAL HIGH (ref 11.5–15.5)
WBC: 11.2 10*3/uL — ABNORMAL HIGH (ref 4.0–10.5)
nRBC: 1.5 % — ABNORMAL HIGH (ref 0.0–0.2)

## 2021-06-08 LAB — T4, FREE: Free T4: 0.98 ng/dL (ref 0.61–1.12)

## 2021-06-08 LAB — TSH: TSH: 4.902 u[IU]/mL — ABNORMAL HIGH (ref 0.350–4.500)

## 2021-06-08 MED ORDER — SODIUM CHLORIDE 0.9% FLUSH
10.0000 mL | Freq: Once | INTRAVENOUS | Status: AC
  Administered 2021-06-08: 10 mL via INTRAVENOUS
  Filled 2021-06-08: qty 10

## 2021-06-08 MED ORDER — SODIUM CHLORIDE 0.9 % IV SOLN
Freq: Once | INTRAVENOUS | Status: AC
  Filled 2021-06-08: qty 250

## 2021-06-08 MED ORDER — HEPARIN SOD (PORK) LOCK FLUSH 100 UNIT/ML IV SOLN
500.0000 [IU] | Freq: Once | INTRAVENOUS | Status: AC | PRN
  Administered 2021-06-08: 500 [IU]
  Filled 2021-06-08: qty 5

## 2021-06-08 MED ORDER — FAMOTIDINE IN NACL 20-0.9 MG/50ML-% IV SOLN
20.0000 mg | Freq: Once | INTRAVENOUS | Status: AC
  Administered 2021-06-08: 20 mg via INTRAVENOUS
  Filled 2021-06-08: qty 50

## 2021-06-08 MED ORDER — SODIUM CHLORIDE 0.9 % IV SOLN
10.0000 mg | Freq: Once | INTRAVENOUS | Status: AC
  Administered 2021-06-08: 10 mg via INTRAVENOUS
  Filled 2021-06-08: qty 1
  Filled 2021-06-08: qty 10

## 2021-06-08 MED ORDER — DIPHENHYDRAMINE HCL 50 MG/ML IJ SOLN
50.0000 mg | Freq: Once | INTRAMUSCULAR | Status: AC
  Administered 2021-06-08: 50 mg via INTRAVENOUS
  Filled 2021-06-08: qty 1

## 2021-06-08 MED ORDER — SODIUM CHLORIDE 0.9 % IV SOLN
70.0000 mg/m2 | Freq: Once | INTRAVENOUS | Status: AC
  Administered 2021-06-08: 102 mg via INTRAVENOUS
  Filled 2021-06-08: qty 17

## 2021-06-08 NOTE — Progress Notes (Signed)
?Hematology/Oncology Progress note ?[phone] [phone] ?  ? ?   ? ? ?Patient Care Team: ?[name], NP as PCP - General (Nurse Practitioner) ? ?REFERRING PROVIDER: ?[name], NP  ?CHIEF COMPLAINTS/REASON FOR VISIT:  ?Follow-up for triple negative right breast cancer. ? ?HISTORY OF PRESENTING ILLNESS:  ? ?Yvonne Scott is a  61 y.o.  female with PMH listed below was seen in consultation at the request of  [name], NP  for evaluation of breast cancer. ? ?August 2022, patient was diagnosed with right breast stage IIIb cT3 N0M0, grade 2, ER negative, PR negative HER2 negative breast cancer.  Patient self palpated breast mass many years ago.  10/21/2020 diagnostic mammogram and ultrasound confirmed presence of mass and a subsequent biopsy revealed right breast triple negative cancer. ?There was plan for patient to start with neoadjuvant chemotherapy followed by surgery and radiation.  Unfortunately patient never started treatment.  Patient was accompanied by her mother.  They want to transfer her oncology care to locally. ? ?Patient reports having right breast pain as well as generalized pain.  Patient had a [name] placed 3 weeks ago and she reports some swelling around the sites.  Patient has had negative genetic testing at [facility]. ? ?Patient has a history of alcohol dependence.  Per patient she has not been drinking recently.  Everyday smoker. ? ? ?02/24/2021, CT chest abdomen pelvis showed large right breast mass, 5.5 x 4.8 cm with small right axillary lymph node with variable degrees of enhancement potentially involved but not specific based on size.  This tracked down to retropectoral nodal stations largest lymph node along the lateral margin of the pectoralis major.  Tiny pulmonary nodules in the chest as discussed.  These are nonspecific but would consider short interval follow-up to assess for any changes.  No evidence of metastatic disease involving the  abdomen or pelvis.  Aortic atherosclerosis. ? ?02/25/2021, echocardiogram showed LVEF 65 to 70%.  Patient has been to chemotherapy class and understands antiemetics instructions. ? ?03/16/2021, bone scan showed no evidence of osseous metastatic breast cancer, asymmetric osseous uptake on the right at L5-S1, corresponding to degenerative disc disease on recent CT.  Asymmetric soft tissue uptake in the right breast. ? ?INTERVAL HISTORY ?Yvonne Scott is a 61 y.o. female who has above history reviewed by me today presents for follow up visit for management of triple negative breast cancer, stage IIIb ?Chemotherapy-induced diarrhea has improved.  she uses Imodium or Lomotil. ?She has noticed that her nutrition supplements and milk has caused diarrhea as well. ?No abdominal pain, nausea vomiting.  She feels much better today. ?Intermittent numbness tingling of her hands. ?Review of Systems  ?Constitutional:  Positive for fatigue. Negative for appetite change, chills and fever.  ?HENT:   Negative for hearing loss, mouth sores and voice change.   ?Eyes:  Negative for eye problems.  ?Respiratory:  Negative for chest tightness and cough.   ?Cardiovascular:  Negative for chest pain.  ?Gastrointestinal:  Positive for diarrhea. Negative for abdominal distention, abdominal pain, blood in stool and nausea.  ?Endocrine: Negative for hot flashes.  ?Genitourinary:  Negative for difficulty urinating and frequency.   ?Musculoskeletal:  Negative for arthralgias.  ?Skin:  Negative for itching and rash.  ?Neurological:  Positive for numbness. Negative for extremity weakness.  ?Hematological:  Negative for adenopathy.  ?Psychiatric/Behavioral:  Negative for confusion. The patient is nervous/anxious.   ? ? ?MEDICAL HISTORY:  ?Past Medical History:  ?Diagnosis Date  ?  Anxiety   ? Aortic atherosclerosis ([place]) 10/24/2016  ? Breast cancer ([place])   ? GERD (gastroesophageal reflux disease)   ? ? ?SURGICAL HISTORY: ?Past Surgical History:   ?Procedure Laterality Date  ? BREAST BIOPSY    ? ? ?SOCIAL HISTORY: ?Social History  ? ?Socioeconomic History  ? Marital status: Widowed  ?  Spouse name: Not on file  ? Number of children: Not on file  ? Years of education: Not on file  ? Highest education level: Not on file  ?Occupational History  ? Not on file  ?Tobacco Use  ? Smoking status: Every Day  ?  Packs/day: 1.00  ?  Years: 36.00  ?  Pack years: 36.00  ?  Types: Cigarettes  ?  Start date: 03/15/1979  ? Smokeless tobacco: Never  ?Vaping Use  ? Vaping Use: Never used  ?Substance and Sexual Activity  ? Alcohol use: No  ?  Alcohol/week: 3.0 standard drinks  ?  Types: 3 Standard drinks or equivalent per week  ? Drug use: Not Currently  ? Sexual activity: Not Currently  ?  Birth control/protection: None  ?Other Topics Concern  ? Not on file  ?Social History Narrative  ? ** Merged History Encounter **  ?    ? ?Social Determinants of Health  ? ?Financial Resource Strain: Not on file  ?Food Insecurity: Not on file  ?Transportation Needs: Not on file  ?Physical Activity: Not on file  ?Stress: Not on file  ?Social Connections: Not on file  ?Intimate Partner Violence: Not on file  ? ? ?FAMILY HISTORY: ?Family History  ?Problem Relation Age of Onset  ? Cancer Mother 57  ?     breast  ? Cancer Father   ?     melonma  ? Cirrhosis Maternal Grandfather   ? Cancer Paternal Grandmother   ?     skin/breast  ? ? ?ALLERGIES:  has no allergies on file. ? ?MEDICATIONS:  ?Current Outpatient Medications  ?Medication Sig Dispense Refill  ? calcium-vitamin D (OSCAL WITH D) 500-5 MG-MCG tablet Take 2 tablets by mouth daily. 60 tablet 5  ? dexamethasone (DECADRON) 4 MG tablet TAKE 2 TABLETS BY MOUTH ONCE DAILY FOR 2 DAYS AFTER  CHEMOTHERAPY.  TAKE  WITH  FOOD. 15 tablet 0  ? diphenoxylate-atropine (LOMOTIL) 2.5-0.025 MG tablet Take 1 tablet by mouth 4 (four) times daily as needed for diarrhea or loose stools. 30 tablet 0  ? folic acid (FOLVITE) 1 MG tablet Take 1 tablet (1 mg total) by  mouth daily. 30 tablet 0  ? gabapentin (NEURONTIN) 100 MG capsule Take 1 capsule (100 mg total) by mouth 3 (three) times daily. 90 capsule 0  ? guaiFENesin (MUCINEX) 600 MG 12 hr tablet Take 2 tablets (1,200 mg total) by mouth 2 (two) times daily. 60 tablet 0  ? ibuprofen (ADVIL) 200 MG tablet Take 200 mg by mouth every 6 (six) hours as needed for headache.    ? lidocaine-prilocaine (EMLA) cream Apply to affected area once 30 g 3  ? loperamide (IMODIUM) 2 MG capsule Take 1 capsule (2 mg total) by mouth See admin instructions. Initial: 4 mg, followed by 2 mg after each loose stool; maximum: 16 mg/day 90 capsule 1  ? LORazepam (ATIVAN) 0.5 MG tablet Take 1 tablet (0.5 mg total) by mouth every 12 (twelve) hours as needed for anxiety (nausea). 15 tablet 0  ? magic mouthwash (multi-ingredient) oral suspension Take 5 mLs by mouth 4 (four) times daily as needed for  mouth pain. 480 mL 1  ? magnesium chloride (SLOW-MAG) 64 MG TBEC SR tablet Take 1 tablet (64 mg total) by mouth 2 (two) times daily. 60 tablet 2  ? Multiple Vitamin (MULTIVITAMIN WITH MINERALS) TABS tablet Take 1 tablet by mouth daily. 30 tablet 0  ? omeprazole (PRILOSEC) 20 MG capsule Take 20 mg by mouth daily as needed (reflux).    ? ondansetron (ZOFRAN) 8 MG tablet Take 1 tablet (8 mg total) by mouth 2 (two) times daily as needed. Start on the third day after carboplatin and AC chemotherapy. 30 tablet 1  ? potassium chloride SA (KLOR-CON M) 20 MEQ tablet Take 1 tablet (20 mEq total) by mouth 2 (two) times daily. 60 tablet 1  ? prochlorperazine (COMPAZINE) 10 MG tablet Take 1 tablet (10 mg total) by mouth every 6 (six) hours as needed (Nausea or vomiting). 30 tablet 1  ? thiamine 100 MG tablet Take 0.5 tablets (50 mg total) by mouth daily. 15 tablet 0  ? VENTOLIN HFA 108 (90 Base) MCG/ACT inhaler Inhale 2 puffs into the lungs 4 (four) times daily as needed.    ? fluconazole (DIFLUCAN) 150 MG tablet Take 1 tablet (150 mg total) by mouth See admin instructions.  Take one tablet, and repeat the second dose in 3 days. (Patient not taking: Reported on 06/01/2021) 2 tablet 0  ? ?No current facility-administered medications for this visit.  ? ? ? ?PHYSICAL EXAMINATION: ?ECOG

## 2021-06-08 NOTE — Patient Instructions (Signed)
? ? ?[facility] AT [place]  Discharge Instructions: ?Thank you for choosing [place] to provide your oncology and hematology care.  ?If you have a lab appointment with the [place], please go directly to the [place] and check in at the registration area. ? ?Wear comfortable clothing and clothing appropriate for easy access to any Portacath or PICC line.  ? ?We strive to give you quality time with your provider. You may need to reschedule your appointment if you arrive late (15 or more minutes).  Arriving late affects you and other patients whose appointments are after yours.  Also, if you miss three or more appointments without notifying the office, you may be dismissed from the clinic at the provider?s discretion.    ?  ?For prescription refill requests, have your pharmacy contact our office and allow 72 hours for refills to be completed.   ? ?Today you received the following chemotherapy and/or immunotherapy agents TAXOL ?    ?  ?To help prevent nausea and vomiting after your treatment, we encourage you to take your nausea medication as directed. ? ?BELOW ARE SYMPTOMS THAT SHOULD BE REPORTED IMMEDIATELY: ?*FEVER GREATER THAN 100.4 F (38 ?C) OR HIGHER ?*CHILLS OR SWEATING ?*NAUSEA AND VOMITING THAT IS NOT CONTROLLED WITH YOUR NAUSEA MEDICATION ?*UNUSUAL SHORTNESS OF BREATH ?*UNUSUAL BRUISING OR BLEEDING ?*URINARY PROBLEMS (pain or burning when urinating, or frequent urination) ?*BOWEL PROBLEMS (unusual diarrhea, constipation, pain near the anus) ?TENDERNESS IN MOUTH AND THROAT WITH OR WITHOUT PRESENCE OF ULCERS (sore throat, sores in mouth, or a toothache) ?UNUSUAL RASH, SWELLING OR PAIN  ?UNUSUAL VAGINAL DISCHARGE OR ITCHING  ? ?Items with * indicate a potential emergency and should be followed up as soon as possible or go to the Emergency Department if any problems should occur. ? ?Please show the CHEMOTHERAPY ALERT CARD or IMMUNOTHERAPY ALERT CARD at check-in  to the Emergency Department and triage nurse. ? ?Should you have questions after your visit or need to cancel or reschedule your appointment, please contact [facility] CANCER [place] AT [place]  [phone] and follow the prompts.  Office hours are 8:00 a.m. to 4:30 p.m. Monday - Friday. Please note that voicemails left after 4:00 p.m. may not be returned until the following business day.  We are closed weekends and major holidays. You have access to a nurse at all times for urgent questions. Please call the main number to the clinic [phone] and follow the prompts. ? ?For any non-urgent questions, you may also contact your provider using MyChart. We now offer e-Visits for anyone 30 and older to request care online for non-urgent symptoms. For details visit [email or web address]. ?  ?Also download the MyChart app! Go to the app store, search "MyChart", open the app, select [place], and log in with your MyChart username and password. ? ?Due to Covid, a mask is required upon entering the hospital/clinic. If you do not have a mask, one will be given to you upon arrival. For doctor visits, patients may have 1 support person aged 78 or older with them. For treatment visits, patients cannot have anyone with them due to current Covid guidelines and our immunocompromised population. Paclitaxel injection ?What is this medication? ?PACLITAXEL (PAK li TAX el) is a chemotherapy drug. It targets fast dividing cells, like cancer cells, and causes these cells to die. This medicine is used to treat ovarian cancer, breast cancer, lung cancer, Kaposi's sarcoma, and other cancers. ?This medicine may be used for other purposes;  ask your health care provider or pharmacist if you have questions. ?COMMON BRAND NAME(S): Onxol, Taxol ?What should I tell my care team before I take this medication? ?They need to know if you have any of these conditions: ?history of irregular heartbeat ?liver disease ?low blood counts, like  low white cell, platelet, or red cell counts ?lung or breathing disease, like asthma ?tingling of the fingers or toes, or other nerve disorder ?an unusual or allergic reaction to paclitaxel, alcohol, polyoxyethylated castor oil, other chemotherapy, other medicines, foods, dyes, or preservatives ?pregnant or trying to get pregnant ?breast-feeding ?How should I use this medication? ?This drug is given as an infusion into a vein. It is administered in a hospital or clinic by a specially trained health care professional. ?Talk to your pediatrician regarding the use of this medicine in children. Special care may be needed. ?Overdosage: If you think you have taken too much of this medicine contact a poison control center or emergency room at once. ?NOTE: This medicine is only for you. Do not share this medicine with others. ?What if I miss a dose? ?It is important not to miss your dose. Call your doctor or health care professional if you are unable to keep an appointment. ?What may interact with this medication? ?Do not take this medicine with any of the following medications: ?live virus vaccines ?This medicine may also interact with the following medications: ?antiviral medicines for hepatitis, HIV or AIDS ?certain antibiotics like erythromycin and clarithromycin ?certain medicines for fungal infections like ketoconazole and itraconazole ?certain medicines for seizures like carbamazepine, phenobarbital, phenytoin ?gemfibrozil ?nefazodone ?rifampin ?St. John's wort ?This list may not describe all possible interactions. Give your health care provider a list of all the medicines, herbs, non-prescription drugs, or dietary supplements you use. Also tell them if you smoke, drink alcohol, or use illegal drugs. Some items may interact with your medicine. ?What should I watch for while using this medication? ?Your condition will be monitored carefully while you are receiving this medicine. You will need important blood work done  while you are taking this medicine. ?This medicine can cause serious allergic reactions. To reduce your risk you will need to take other medicine(s) before treatment with this medicine. If you experience allergic reactions like skin rash, itching or hives, swelling of the face, lips, or tongue, tell your doctor or health care professional right away. ?In some cases, you may be given additional medicines to help with side effects. Follow all directions for their use. ?This drug may make you feel generally unwell. This is not uncommon, as chemotherapy can affect healthy cells as well as cancer cells. Report any side effects. Continue your course of treatment even though you feel ill unless your doctor tells you to stop. ?Call your doctor or health care professional for advice if you get a fever, chills or sore throat, or other symptoms of a cold or flu. Do not treat yourself. This drug decreases your body's ability to fight infections. Try to avoid being around people who are sick. ?This medicine may increase your risk to bruise or bleed. Call your doctor or health care professional if you notice any unusual bleeding. ?Be careful brushing and flossing your teeth or using a toothpick because you may get an infection or bleed more easily. If you have any dental work done, tell your dentist you are receiving this medicine. ?Avoid taking products that contain aspirin, acetaminophen, ibuprofen, naproxen, or ketoprofen unless instructed by your doctor. These medicines may hide  a fever. ?Do not become pregnant while taking this medicine. Women should inform their doctor if they wish to become pregnant or think they might be pregnant. There is a potential for serious side effects to an unborn child. Talk to your health care professional or pharmacist for more information. Do not breast-feed an infant while taking this medicine. ?Men are advised not to father a child while receiving this medicine. ?This product may contain  alcohol. Ask your pharmacist or healthcare provider if this medicine contains alcohol. Be sure to tell all healthcare providers you are taking this medicine. Certain medicines, like metronidazole and di

## 2021-06-08 NOTE — Progress Notes (Signed)
Nutrition Follow-up: ? ?Patient with triple negative breast cancer.  Patient receiving taxol, carboplatin and keytruda.   ? ?Met with patient today during infusion. Received message from Dr Tasia Catchings saying patient feels that ensure and boost increases diarrhea.   ?Patient reports that she was able to eat a hot dog yesterday and it tasted good.  Along with diarrhea, patient is also struggling with taste alterations.  Eating pastas as well.   ? ? ? ?Medications: reviewed ? ?Labs: reviewed ? ?Anthropometrics:  ? ?Weight 104 lb today ? ?107 lb 4.8 oz on 3/14 ?109 lb 14.4 oz on 2/7 ?109 lb 8 oz on 1/10 ? ? ?NUTRITION DIAGNOSIS: Inadequate oral intake continues ? ? ?INTERVENTION:  ?Provided samples of non-dairy shakes (orgain vegan shake, [employer] 1.4, protein powder).  Encouraged patient can try Fairlife shakes and milk as well as lactaid milk to see if it helps symptoms.  Patient says that she never had an issue tolerating milk and milk products prior to treatment.  ? ?  ? ?MONITORING, EVALUATION, GOAL: weight trends, intake ? ? ?NEXT VISIT: ~ 2 weeks during treatment ? ?Gardy Montanari B. Zenia Resides, RD, LDN ?Registered Dietitian ?[phone] ? ? ?

## 2021-06-14 ENCOUNTER — Other Ambulatory Visit: Payer: Self-pay | Admitting: Oncology

## 2021-06-14 DIAGNOSIS — C50911 Malignant neoplasm of unspecified site of right female breast: Secondary | ICD-10-CM

## 2021-06-15 ENCOUNTER — Encounter: Payer: Self-pay | Admitting: Oncology

## 2021-06-15 ENCOUNTER — Inpatient Hospital Stay: Payer: Medicaid Other

## 2021-06-15 ENCOUNTER — Inpatient Hospital Stay: Payer: Medicaid Other | Attending: Oncology

## 2021-06-15 ENCOUNTER — Inpatient Hospital Stay (HOSPITAL_BASED_OUTPATIENT_CLINIC_OR_DEPARTMENT_OTHER): Payer: Medicaid Other | Admitting: Oncology

## 2021-06-15 VITALS — BP 145/79 | HR 87 | Resp 18

## 2021-06-15 VITALS — BP 162/82 | HR 97 | Temp 98.5°F | Wt 105.0 lb

## 2021-06-15 DIAGNOSIS — Z5112 Encounter for antineoplastic immunotherapy: Secondary | ICD-10-CM | POA: Diagnosis not present

## 2021-06-15 DIAGNOSIS — Z5111 Encounter for antineoplastic chemotherapy: Secondary | ICD-10-CM | POA: Diagnosis not present

## 2021-06-15 DIAGNOSIS — E876 Hypokalemia: Secondary | ICD-10-CM

## 2021-06-15 DIAGNOSIS — T451X5A Adverse effect of antineoplastic and immunosuppressive drugs, initial encounter: Secondary | ICD-10-CM

## 2021-06-15 DIAGNOSIS — Z5189 Encounter for other specified aftercare: Secondary | ICD-10-CM | POA: Insufficient documentation

## 2021-06-15 DIAGNOSIS — G620 Drug-induced polyneuropathy: Secondary | ICD-10-CM

## 2021-06-15 DIAGNOSIS — K521 Toxic gastroenteritis and colitis: Secondary | ICD-10-CM

## 2021-06-15 DIAGNOSIS — Z79899 Other long term (current) drug therapy: Secondary | ICD-10-CM | POA: Insufficient documentation

## 2021-06-15 DIAGNOSIS — C50911 Malignant neoplasm of unspecified site of right female breast: Secondary | ICD-10-CM

## 2021-06-15 DIAGNOSIS — C50411 Malignant neoplasm of upper-outer quadrant of right female breast: Secondary | ICD-10-CM | POA: Diagnosis not present

## 2021-06-15 DIAGNOSIS — R300 Dysuria: Secondary | ICD-10-CM | POA: Insufficient documentation

## 2021-06-15 DIAGNOSIS — Z171 Estrogen receptor negative status [ER-]: Secondary | ICD-10-CM | POA: Diagnosis not present

## 2021-06-15 DIAGNOSIS — F419 Anxiety disorder, unspecified: Secondary | ICD-10-CM

## 2021-06-15 LAB — COMPREHENSIVE METABOLIC PANEL
ALT: 11 U/L (ref 0–44)
AST: 18 U/L (ref 15–41)
Albumin: 3.6 g/dL (ref 3.5–5.0)
Alkaline Phosphatase: 53 U/L (ref 38–126)
Anion gap: 10 (ref 5–15)
BUN: 10 mg/dL (ref 8–23)
CO2: 23 mmol/L (ref 22–32)
Calcium: 9.1 mg/dL (ref 8.9–10.3)
Chloride: 99 mmol/L (ref 98–111)
Creatinine, Ser: 0.69 mg/dL (ref 0.44–1.00)
GFR, Estimated: >60 mL/min (ref >60)
Glucose, Bld: 96 mg/dL (ref 70–99)
Potassium: 3.6 mmol/L (ref 3.5–5.1)
Sodium: 132 mmol/L — ABNORMAL LOW (ref 135–145)
Total Bilirubin: 0.3 mg/dL (ref 0.3–1.2)
Total Protein: 6.5 g/dL (ref 6.5–8.1)

## 2021-06-15 LAB — CBC WITH DIFFERENTIAL/PLATELET
Abs Immature Granulocytes: 0.13 10*3/uL — ABNORMAL HIGH (ref 0.00–0.07)
Basophils Absolute: 0 10*3/uL (ref 0.0–0.1)
Basophils Relative: 0 %
Eosinophils Absolute: 0 10*3/uL (ref 0.0–0.5)
Eosinophils Relative: 0 %
HCT: 27 % — ABNORMAL LOW (ref 36.0–46.0)
Hemoglobin: 9.1 g/dL — ABNORMAL LOW (ref 12.0–15.0)
Immature Granulocytes: 1 %
Lymphocytes Relative: 43 %
Lymphs Abs: 5.5 10*3/uL — ABNORMAL HIGH (ref 0.7–4.0)
MCH: 34.9 pg — ABNORMAL HIGH (ref 26.0–34.0)
MCHC: 33.7 g/dL (ref 30.0–36.0)
MCV: 103.4 fL — ABNORMAL HIGH (ref 80.0–100.0)
Monocytes Absolute: 0.6 10*3/uL (ref 0.1–1.0)
Monocytes Relative: 5 %
Neutro Abs: 6.5 10*3/uL (ref 1.7–7.7)
Neutrophils Relative %: 51 %
Platelets: 193 10*3/uL (ref 150–400)
RBC: 2.61 MIL/uL — ABNORMAL LOW (ref 3.87–5.11)
RDW: 20.7 % — ABNORMAL HIGH (ref 11.5–15.5)
WBC: 12.8 10*3/uL — ABNORMAL HIGH (ref 4.0–10.5)
nRBC: 0 % (ref 0.0–0.2)

## 2021-06-15 LAB — MAGNESIUM: Magnesium: 1.5 mg/dL — ABNORMAL LOW (ref 1.7–2.4)

## 2021-06-15 MED ORDER — FAMOTIDINE IN NACL 20-0.9 MG/50ML-% IV SOLN
20.0000 mg | Freq: Once | INTRAVENOUS | Status: AC
  Administered 2021-06-15: 20 mg via INTRAVENOUS
  Filled 2021-06-15: qty 50

## 2021-06-15 MED ORDER — MAGNESIUM SULFATE 2 GM/50ML IV SOLN
2.0000 g | Freq: Once | INTRAVENOUS | Status: AC
  Administered 2021-06-15: 2 g via INTRAVENOUS
  Filled 2021-06-15: qty 50

## 2021-06-15 MED ORDER — DIPHENHYDRAMINE HCL 50 MG/ML IJ SOLN
50.0000 mg | Freq: Once | INTRAMUSCULAR | Status: AC
  Administered 2021-06-15: 50 mg via INTRAVENOUS
  Filled 2021-06-15: qty 1

## 2021-06-15 MED ORDER — SODIUM CHLORIDE 0.9 % IV SOLN
10.0000 mg | Freq: Once | INTRAVENOUS | Status: AC
  Administered 2021-06-15: 10 mg via INTRAVENOUS
  Filled 2021-06-15: qty 10

## 2021-06-15 MED ORDER — HEPARIN SOD (PORK) LOCK FLUSH 100 UNIT/ML IV SOLN
500.0000 [IU] | Freq: Once | INTRAVENOUS | Status: AC | PRN
  Administered 2021-06-15: 500 [IU]
  Filled 2021-06-15: qty 5

## 2021-06-15 MED ORDER — SODIUM CHLORIDE 0.9% FLUSH
10.0000 mL | INTRAVENOUS | Status: DC | PRN
  Administered 2021-06-15: 10 mL
  Filled 2021-06-15: qty 10

## 2021-06-15 MED ORDER — SODIUM CHLORIDE 0.9 % IV SOLN
Freq: Once | INTRAVENOUS | Status: AC
  Filled 2021-06-15: qty 250

## 2021-06-15 MED ORDER — SODIUM CHLORIDE 0.9 % IV SOLN
70.0000 mg/m2 | Freq: Once | INTRAVENOUS | Status: AC
  Administered 2021-06-15: 102 mg via INTRAVENOUS
  Filled 2021-06-15: qty 17

## 2021-06-15 NOTE — Patient Instructions (Signed)
[facility] AT [place]  Discharge Instructions: Thank you for choosing [facility] to provide your oncology and hematology care.  If you have a lab appointment with the Cancer Center, please go directly to the Cancer Center and check in at the registration area.  Wear comfortable clothing and clothing appropriate for easy access to any Portacath or PICC line.   We strive to give you quality time with your provider. You may need to reschedule your appointment if you arrive late (15 or more minutes).  Arriving late affects you and other patients whose appointments are after yours.  Also, if you miss three or more appointments without notifying the office, you may be dismissed from the clinic at the provider's discretion.      For prescription refill requests, have your pharmacy contact our office and allow 72 hours for refills to be completed.    Today you received the following chemotherapy and/or immunotherapy agents Taxol      To help prevent nausea and vomiting after your treatment, we encourage you to take your nausea medication as directed.  BELOW ARE SYMPTOMS THAT SHOULD BE REPORTED IMMEDIATELY: *FEVER GREATER THAN 100.4 F (38 C) OR HIGHER *CHILLS OR SWEATING *NAUSEA AND VOMITING THAT IS NOT CONTROLLED WITH YOUR NAUSEA MEDICATION *UNUSUAL SHORTNESS OF BREATH *UNUSUAL BRUISING OR BLEEDING *URINARY PROBLEMS (pain or burning when urinating, or frequent urination) *BOWEL PROBLEMS (unusual diarrhea, constipation, pain near the anus) TENDERNESS IN MOUTH AND THROAT WITH OR WITHOUT PRESENCE OF ULCERS (sore throat, sores in mouth, or a toothache) UNUSUAL RASH, SWELLING OR PAIN  UNUSUAL VAGINAL DISCHARGE OR ITCHING   Items with * indicate a potential emergency and should be followed up as soon as possible or go to the Emergency Department if any problems should occur.  Please show the CHEMOTHERAPY ALERT CARD or IMMUNOTHERAPY ALERT CARD at check-in to the  Emergency Department and triage nurse.  Should you have questions after your visit or need to cancel or reschedule your appointment, please contact [facility] [place]  [phone] and follow the prompts.  Office hours are 8:00 a.m. to 4:30 p.m. Monday - Friday. Please note that voicemails left after 4:00 p.m. may not be returned until the following business day.  We are closed weekends and major holidays. You have access to a nurse at all times for urgent questions. Please call the main number to the clinic [phone] and follow the prompts.  For any non-urgent questions, you may also contact your provider using MyChart. We now offer e-Visits for anyone 18 and older to request care online for non-urgent symptoms. For details visit [email or web address].   Also download the MyChart app! Go to the app store, search "MyChart", open the app, select [place], and log in with your MyChart username and password.  Due to Covid, a mask is required upon entering the hospital/clinic. If you do not have a mask, one will be given to you upon arrival. For doctor visits, patients may have 1 support person aged 18 or older with them. For treatment visits, patients cannot have anyone with them due to current Covid guidelines and our immunocompromised population.  

## 2021-06-15 NOTE — Progress Notes (Signed)
?Hematology/Oncology Progress note ?Telephone:(336) [phone] [phone] ?  ? ?   ? ? ?Patient Care Team: ?[name], NP as PCP - General (Nurse Practitioner) ? ?REFERRING PROVIDER: ?[name], NP  ?CHIEF COMPLAINTS/REASON FOR VISIT:  ?Follow-up for triple negative right breast cancer. ? ?HISTORY OF PRESENTING ILLNESS:  ? ?Yvonne Scott is a  61 y.o.  female with PMH listed below was seen in consultation at the request of  [name], NP  for evaluation of breast cancer. ? ?August 2022, patient was diagnosed with right breast stage IIIb cT3 N0M0, grade 2, ER negative, PR negative HER2 negative breast cancer.  Patient self palpated breast mass many years ago.  10/21/2020 diagnostic mammogram and ultrasound confirmed presence of mass and a subsequent biopsy revealed right breast triple negative cancer. ?There was plan for patient to start with neoadjuvant chemotherapy followed by surgery and radiation.  Unfortunately patient never started treatment.  Patient was accompanied by her mother.  They want to transfer her oncology care to locally. ? ?Patient reports having right breast pain as well as generalized pain.  Patient had a [name] placed 3 weeks ago and she reports some swelling around the sites.  Patient has had negative genetic testing at [facility]. ? ?Patient has a history of alcohol dependence.  Per patient she has not been drinking recently.  Everyday smoker. ? ? ?02/24/2021, CT chest abdomen pelvis showed large right breast mass, 5.5 x 4.8 cm with small right axillary lymph node with variable degrees of enhancement potentially involved but not specific based on size.  This tracked down to retropectoral nodal stations largest lymph node along the lateral margin of the pectoralis major.  Tiny pulmonary nodules in the chest as discussed.  These are nonspecific but would consider short interval follow-up to assess for any changes.  No evidence of metastatic disease involving the  abdomen or pelvis.  Aortic atherosclerosis. ? ?02/25/2021, echocardiogram showed LVEF 65 to 70%.  Patient has been to chemotherapy class and understands antiemetics instructions. ? ?03/16/2021, bone scan showed no evidence of osseous metastatic breast cancer, asymmetric osseous uptake on the right at L5-S1, corresponding to degenerative disc disease on recent CT.  Asymmetric soft tissue uptake in the right breast. ? ?INTERVAL HISTORY ?SURAIYA Scott is a 61 y.o. female who has above history reviewed by me today presents for follow up visit for management of triple negative breast cancer, stage IIIb ?Chemotherapy-induced diarrhea has improved ?Chronic intermittent numbness tingling of the hands.  Stable ?She feels well today.  Denies any abdominal pain, nausea vomiting. ? ?Review of Systems  ?Constitutional:  Positive for fatigue. Negative for appetite change, chills and fever.  ?HENT:   Negative for hearing loss, mouth sores and voice change.   ?Eyes:  Negative for eye problems.  ?Respiratory:  Negative for chest tightness and cough.   ?Cardiovascular:  Negative for chest pain.  ?Gastrointestinal:  Negative for abdominal distention, abdominal pain, blood in stool, diarrhea and nausea.  ?Endocrine: Negative for hot flashes.  ?Genitourinary:  Negative for difficulty urinating and frequency.   ?Musculoskeletal:  Negative for arthralgias.  ?Skin:  Negative for itching and rash.  ?Neurological:  Positive for numbness. Negative for extremity weakness.  ?Hematological:  Negative for adenopathy.  ?Psychiatric/Behavioral:  Negative for confusion. The patient is nervous/anxious.   ? ?MEDICAL HISTORY:  ?Past Medical History:  ?Diagnosis Date  ? Anxiety   ? Aortic atherosclerosis ([place]) 10/24/2016  ? Breast cancer ([place])   ? GERD (gastroesophageal  reflux disease)   ? ? ?SURGICAL HISTORY: ?Past Surgical History:  ?Procedure Laterality Date  ? BREAST BIOPSY    ? ? ?SOCIAL HISTORY: ?Social History  ? ?Socioeconomic History  ?  Marital status: Widowed  ?  Spouse name: Not on file  ? Number of children: Not on file  ? Years of education: Not on file  ? Highest education level: Not on file  ?Occupational History  ? Not on file  ?Tobacco Use  ? Smoking status: Every Day  ?  Packs/day: 1.00  ?  Years: 36.00  ?  Pack years: 36.00  ?  Types: Cigarettes  ?  Start date: 03/15/1979  ? Smokeless tobacco: Never  ?Vaping Use  ? Vaping Use: Never used  ?Substance and Sexual Activity  ? Alcohol use: No  ?  Alcohol/week: 3.0 standard drinks  ?  Types: 3 Standard drinks or equivalent per week  ? Drug use: Not Currently  ? Sexual activity: Not Currently  ?  Birth control/protection: None  ?Other Topics Concern  ? Not on file  ?Social History Narrative  ? ** Merged History Encounter **  ?    ? ?Social Determinants of Health  ? ?Financial Resource Strain: Not on file  ?Food Insecurity: Not on file  ?Transportation Needs: Not on file  ?Physical Activity: Not on file  ?Stress: Not on file  ?Social Connections: Not on file  ?Intimate Partner Violence: Not on file  ? ? ?FAMILY HISTORY: ?Family History  ?Problem Relation Age of Onset  ? Cancer Mother 73  ?     breast  ? Cancer Father   ?     melonma  ? Cirrhosis Maternal Grandfather   ? Cancer Paternal Grandmother   ?     skin/breast  ? ? ?ALLERGIES:  has no allergies on file. ? ?MEDICATIONS:  ?Current Outpatient Medications  ?Medication Sig Dispense Refill  ? calcium-vitamin D (OSCAL WITH D) 500-5 MG-MCG tablet Take 2 tablets by mouth daily. 60 tablet 5  ? dexamethasone (DECADRON) 4 MG tablet TAKE 2 TABLETS BY MOUTH ONCE DAILY FOR 2 DAYS AFTER  CHEMOTHERAPY.  TAKE  WITH  FOOD. 15 tablet 0  ? diphenoxylate-atropine (LOMOTIL) 2.5-0.025 MG tablet Take 1 tablet by mouth 4 (four) times daily as needed for diarrhea or loose stools. 30 tablet 0  ? fluconazole (DIFLUCAN) 150 MG tablet Take 1 tablet (150 mg total) by mouth See admin instructions. Take one tablet, and repeat the second dose in 3 days. (Patient not taking:  Reported on 06/01/2021) 2 tablet 0  ? folic acid (FOLVITE) 1 MG tablet Take 1 tablet (1 mg total) by mouth daily. 30 tablet 0  ? gabapentin (NEURONTIN) 100 MG capsule Take 1 capsule (100 mg total) by mouth 3 (three) times daily. 90 capsule 0  ? guaiFENesin (MUCINEX) 600 MG 12 hr tablet Take 2 tablets (1,200 mg total) by mouth 2 (two) times daily. 60 tablet 0  ? ibuprofen (ADVIL) 200 MG tablet Take 200 mg by mouth every 6 (six) hours as needed for headache.    ? lidocaine-prilocaine (EMLA) cream Apply to affected area once 30 g 3  ? loperamide (IMODIUM) 2 MG capsule Take 1 capsule (2 mg total) by mouth See admin instructions. Initial: 4 mg, followed by 2 mg after each loose stool; maximum: 16 mg/day 90 capsule 1  ? LORazepam (ATIVAN) 0.5 MG tablet Take 1 tablet (0.5 mg total) by mouth every 12 (twelve) hours as needed for anxiety (nausea). 15 tablet  0  ? magic mouthwash (multi-ingredient) oral suspension Take 5 mLs by mouth 4 (four) times daily as needed for mouth pain. 480 mL 1  ? magnesium chloride (SLOW-MAG) 64 MG TBEC SR tablet Take 1 tablet (64 mg total) by mouth 2 (two) times daily. 60 tablet 2  ? Multiple Vitamin (MULTIVITAMIN WITH MINERALS) TABS tablet Take 1 tablet by mouth daily. 30 tablet 0  ? omeprazole (PRILOSEC) 20 MG capsule Take 20 mg by mouth daily as needed (reflux).    ? ondansetron (ZOFRAN) 8 MG tablet TAKE 1 TABLET BY MOUTH TWICE DAILY AS NEEDED. START THE THIRD DAY AFTER CARBOPLATIN AND AC CHEMOTHERAPY. 30 tablet 0  ? potassium chloride SA (KLOR-CON M) 20 MEQ tablet Take 1 tablet (20 mEq total) by mouth 2 (two) times daily. 60 tablet 1  ? prochlorperazine (COMPAZINE) 10 MG tablet Take 1 tablet (10 mg total) by mouth every 6 (six) hours as needed (Nausea or vomiting). 30 tablet 1  ? thiamine 100 MG tablet Take 0.5 tablets (50 mg total) by mouth daily. 15 tablet 0  ? VENTOLIN HFA 108 (90 Base) MCG/ACT inhaler Inhale 2 puffs into the lungs 4 (four) times daily as needed.    ? ?No current  facility-administered medications for this visit.  ? ? ? ?PHYSICAL EXAMINATION: ?ECOG PERFORMANCE STATUS: 1 - Symptomatic but completely ambulatory ?Vitals:  ? 06/15/21 1349  ?BP: (!) 162/82  ?Pulse: 97  ?Temp: 98.5 ?F (36.

## 2021-06-16 ENCOUNTER — Inpatient Hospital Stay: Payer: Medicaid Other

## 2021-06-16 VITALS — BP 135/89 | HR 102 | Temp 98.4°F | Resp 18

## 2021-06-16 DIAGNOSIS — Z5112 Encounter for antineoplastic immunotherapy: Secondary | ICD-10-CM | POA: Diagnosis not present

## 2021-06-16 DIAGNOSIS — C50911 Malignant neoplasm of unspecified site of right female breast: Secondary | ICD-10-CM

## 2021-06-16 MED ORDER — HEPARIN SOD (PORK) LOCK FLUSH 100 UNIT/ML IV SOLN
INTRAVENOUS | Status: DC
Start: 2021-06-16 — End: 2021-06-16
  Filled 2021-06-16: qty 5

## 2021-06-16 MED ORDER — FILGRASTIM-AAFI 300 MCG/0.5ML IJ SOSY
300.0000 ug | PREFILLED_SYRINGE | Freq: Once | INTRAMUSCULAR | Status: AC
  Administered 2021-06-16: 300 ug via SUBCUTANEOUS
  Filled 2021-06-16: qty 0.5

## 2021-06-17 ENCOUNTER — Inpatient Hospital Stay: Payer: Medicaid Other

## 2021-06-17 DIAGNOSIS — C50911 Malignant neoplasm of unspecified site of right female breast: Secondary | ICD-10-CM

## 2021-06-17 DIAGNOSIS — Z5112 Encounter for antineoplastic immunotherapy: Secondary | ICD-10-CM | POA: Diagnosis not present

## 2021-06-17 MED ORDER — FILGRASTIM-AAFI 300 MCG/0.5ML IJ SOSY
300.0000 ug | PREFILLED_SYRINGE | Freq: Once | INTRAMUSCULAR | Status: AC
  Administered 2021-06-17: 300 ug via SUBCUTANEOUS
  Filled 2021-06-17: qty 0.5

## 2021-06-22 ENCOUNTER — Inpatient Hospital Stay (HOSPITAL_BASED_OUTPATIENT_CLINIC_OR_DEPARTMENT_OTHER): Payer: Medicaid Other | Admitting: Oncology

## 2021-06-22 ENCOUNTER — Other Ambulatory Visit: Payer: Self-pay

## 2021-06-22 ENCOUNTER — Inpatient Hospital Stay: Payer: Medicaid Other

## 2021-06-22 ENCOUNTER — Telehealth: Payer: Self-pay | Admitting: *Deleted

## 2021-06-22 ENCOUNTER — Encounter: Payer: Self-pay | Admitting: Oncology

## 2021-06-22 VITALS — BP 175/105 | HR 109 | Temp 98.5°F | Wt 101.0 lb

## 2021-06-22 DIAGNOSIS — R300 Dysuria: Secondary | ICD-10-CM

## 2021-06-22 DIAGNOSIS — C50911 Malignant neoplasm of unspecified site of right female breast: Secondary | ICD-10-CM | POA: Diagnosis not present

## 2021-06-22 DIAGNOSIS — K521 Toxic gastroenteritis and colitis: Secondary | ICD-10-CM | POA: Diagnosis not present

## 2021-06-22 DIAGNOSIS — T451X5A Adverse effect of antineoplastic and immunosuppressive drugs, initial encounter: Secondary | ICD-10-CM

## 2021-06-22 DIAGNOSIS — E876 Hypokalemia: Secondary | ICD-10-CM | POA: Diagnosis not present

## 2021-06-22 DIAGNOSIS — R197 Diarrhea, unspecified: Secondary | ICD-10-CM

## 2021-06-22 DIAGNOSIS — E860 Dehydration: Secondary | ICD-10-CM

## 2021-06-22 DIAGNOSIS — Z5111 Encounter for antineoplastic chemotherapy: Secondary | ICD-10-CM

## 2021-06-22 DIAGNOSIS — Z5112 Encounter for antineoplastic immunotherapy: Secondary | ICD-10-CM | POA: Diagnosis not present

## 2021-06-22 DIAGNOSIS — G620 Drug-induced polyneuropathy: Secondary | ICD-10-CM

## 2021-06-22 LAB — CBC WITH DIFFERENTIAL/PLATELET
Abs Immature Granulocytes: 0.84 10*3/uL — ABNORMAL HIGH (ref 0.00–0.07)
Basophils Absolute: 0.1 10*3/uL (ref 0.0–0.1)
Basophils Relative: 1 %
Eosinophils Absolute: 0.1 10*3/uL (ref 0.0–0.5)
Eosinophils Relative: 1 %
HCT: 30.8 % — ABNORMAL LOW (ref 36.0–46.0)
Hemoglobin: 10.2 g/dL — ABNORMAL LOW (ref 12.0–15.0)
Immature Granulocytes: 8 %
Lymphocytes Relative: 61 %
Lymphs Abs: 7 10*3/uL — ABNORMAL HIGH (ref 0.7–4.0)
MCH: 34.9 pg — ABNORMAL HIGH (ref 26.0–34.0)
MCHC: 33.1 g/dL (ref 30.0–36.0)
MCV: 105.5 fL — ABNORMAL HIGH (ref 80.0–100.0)
Monocytes Absolute: 0.6 10*3/uL (ref 0.1–1.0)
Monocytes Relative: 6 %
Neutro Abs: 2.5 10*3/uL (ref 1.7–7.7)
Neutrophils Relative %: 23 %
Platelets: 171 10*3/uL (ref 150–400)
RBC: 2.92 MIL/uL — ABNORMAL LOW (ref 3.87–5.11)
RDW: 19.9 % — ABNORMAL HIGH (ref 11.5–15.5)
WBC: 11.1 10*3/uL — ABNORMAL HIGH (ref 4.0–10.5)
nRBC: 0.2 % (ref 0.0–0.2)

## 2021-06-22 LAB — COMPREHENSIVE METABOLIC PANEL
ALT: 14 U/L (ref 0–44)
AST: 24 U/L (ref 15–41)
Albumin: 3.9 g/dL (ref 3.5–5.0)
Alkaline Phosphatase: 62 U/L (ref 38–126)
Anion gap: 12 (ref 5–15)
BUN: 15 mg/dL (ref 8–23)
CO2: 20 mmol/L — ABNORMAL LOW (ref 22–32)
Calcium: 9 mg/dL (ref 8.9–10.3)
Chloride: 101 mmol/L (ref 98–111)
Creatinine, Ser: 0.85 mg/dL (ref 0.44–1.00)
GFR, Estimated: >60 mL/min (ref >60)
Glucose, Bld: 113 mg/dL — ABNORMAL HIGH (ref 70–99)
Potassium: 3.4 mmol/L — ABNORMAL LOW (ref 3.5–5.1)
Sodium: 133 mmol/L — ABNORMAL LOW (ref 135–145)
Total Bilirubin: 0.5 mg/dL (ref 0.3–1.2)
Total Protein: 6.9 g/dL (ref 6.5–8.1)

## 2021-06-22 LAB — URINALYSIS, COMPLETE (UACMP) WITH MICROSCOPIC
Bilirubin Urine: NEGATIVE
Glucose, UA: NEGATIVE mg/dL
Hgb urine dipstick: NEGATIVE
Ketones, ur: NEGATIVE mg/dL
Nitrite: NEGATIVE
Protein, ur: 100 mg/dL — AB
Specific Gravity, Urine: 1.012 (ref 1.005–1.030)
WBC, UA: >50 WBC/hpf — ABNORMAL HIGH (ref 0–5)
pH: 7 (ref 5.0–8.0)

## 2021-06-22 LAB — MAGNESIUM: Magnesium: 1.9 mg/dL (ref 1.7–2.4)

## 2021-06-22 MED ORDER — HEPARIN SOD (PORK) LOCK FLUSH 100 UNIT/ML IV SOLN
INTRAVENOUS | Status: AC
  Filled 2021-06-22: qty 5

## 2021-06-22 MED ORDER — SODIUM CHLORIDE 0.9 % IV SOLN
Freq: Once | INTRAVENOUS | Status: AC
  Filled 2021-06-22: qty 250

## 2021-06-22 MED ORDER — CIPROFLOXACIN HCL 250 MG PO TABS: 250.0000 mg | ORAL_TABLET | Freq: Two times a day (BID) | ORAL | 0 refills | Status: DC

## 2021-06-22 MED ORDER — OMEPRAZOLE 20 MG PO CPDR: 20.0000 mg | DELAYED_RELEASE_CAPSULE | Freq: Two times a day (BID) | ORAL | 1 refills | Status: DC

## 2021-06-22 MED ORDER — POTASSIUM CHLORIDE 20 MEQ/100ML IV SOLN
20.0000 meq | Freq: Once | INTRAVENOUS | Status: AC
  Administered 2021-06-22: 20 meq via INTRAVENOUS

## 2021-06-22 NOTE — Progress Notes (Signed)
Nutrition Follow-up: ? ?Patient with triple negative breast cancer.  Treatment on hold today.  ? ?Met with patient during infusion of fluids.  Reports that MD is checking her for UTI and checking stool.  Reports burning during urination and diarrhea.  "It does not matter what I eat or drink. It comes right through me. " "I have tried everything and nothing is working for my diarrhea."   ? ? ? ?Medications: reviewed ? ?Labs: reviewed ? ?Anthropometrics:  ? ?Weight 101 lb today ? ?104 lb on 3/28 ?107 lb 4.8 oz on 2/7 ?109 lb 8 oz on 1/10 ? ? ?NUTRITION DIAGNOSIS: Inadequate oral intake continues ? ? ?INTERVENTION:  ?Await urine culture and stool tests.   ?Encouraged fluids and soft/bland foods as able ?  ? ?MONITORING, EVALUATION, GOAL: weight trends, intake ? ? ?NEXT VISIT: to be determined ? ?Andreea Arca B. Zenia Resides, RD, LDN ?Registered Dietitian ?[phone] [id number] ? ? ?

## 2021-06-22 NOTE — Progress Notes (Signed)
Pt unable to give stool sample while at clinic. RN made pt aware of the importance of bring back a stool sample to the clinic for the MD to decide on further treatment. Specimen cup sent home with pt. Pt verbalized understanding. MD made aware. Per MD pt may be discharged and will be notified of urine specimen results by treatment team. Pt stable for discharge.  ? ?Yvonne Scott  ?

## 2021-06-22 NOTE — Progress Notes (Signed)
?Hematology/Oncology Progress note ?Telephone:(336) [phone] [phone] ?  ? ?   ? ? ?Patient Care Team: ?[name], NP as PCP - General (Nurse Practitioner) ? ?REFERRING PROVIDER: ?[name], NP  ?CHIEF COMPLAINTS/REASON FOR VISIT:  ?Follow-up for triple negative right breast cancer. ? ?HISTORY OF PRESENTING ILLNESS:  ? ?Yvonne Scott is a  61 y.o.  female with PMH listed below was seen in consultation at the request of  [name], NP  for evaluation of breast cancer. ? ?August 2022, patient was diagnosed with right breast stage IIIb cT3 N0M0, grade 2, ER negative, PR negative HER2 negative breast cancer.  Patient self palpated breast mass many years ago.  10/21/2020 diagnostic mammogram and ultrasound confirmed presence of mass and a subsequent biopsy revealed right breast triple negative cancer. ?There was plan for patient to start with neoadjuvant chemotherapy followed by surgery and radiation.  Unfortunately patient never started treatment.  Patient was accompanied by her mother.  They want to transfer her oncology care to locally. ? ?Patient reports having right breast pain as well as generalized pain.  Patient had a [name] placed 3 weeks ago and she reports some swelling around the sites.  Patient has had negative genetic testing at [facility]. ? ?Patient has a history of alcohol dependence.  Per patient she has not been drinking recently.  Everyday smoker. ? ? ?02/24/2021, CT chest abdomen pelvis showed large right breast mass, 5.5 x 4.8 cm with small right axillary lymph node with variable degrees of enhancement potentially involved but not specific based on size.  This tracked down to retropectoral nodal stations largest lymph node along the lateral margin of the pectoralis major.  Tiny pulmonary nodules in the chest as discussed.  These are nonspecific but would consider short interval follow-up to assess for any changes.  No evidence of metastatic disease involving the  abdomen or pelvis.  Aortic atherosclerosis. ? ?02/25/2021, echocardiogram showed LVEF 65 to 70%.  Patient has been to chemotherapy class and understands antiemetics instructions. ? ?03/16/2021, bone scan showed no evidence of osseous metastatic breast cancer, asymmetric osseous uptake on the right at L5-S1, corresponding to degenerative disc disease on recent CT.  Asymmetric soft tissue uptake in the right breast. ? ?INTERVAL HISTORY ?Yvonne Scott is a 61 y.o. female who has above history reviewed by me today presents for follow up visit for management of triple negative breast cancer, stage IIIB ?Patient continues to have diarrhea, she reports that she has loose bowel movement every couple of hours.  She used Imodium and Lomotil as needed.  With no improvement.  She feels weak.  Burning sensation during urination ? No fever or chills.  No nausea vomiting. ? ? ?Review of Systems  ?Constitutional:  Positive for fatigue. Negative for appetite change, chills and fever.  ?HENT:   Negative for hearing loss, mouth sores and voice change.   ?Eyes:  Negative for eye problems.  ?Respiratory:  Negative for chest tightness and cough.   ?Cardiovascular:  Negative for chest pain.  ?Gastrointestinal:  Positive for diarrhea. Negative for abdominal distention, abdominal pain, blood in stool and nausea.  ?Endocrine: Negative for hot flashes.  ?Genitourinary:  Positive for dysuria. Negative for difficulty urinating and frequency.   ?Musculoskeletal:  Negative for arthralgias.  ?Skin:  Negative for itching and rash.  ?Neurological:  Positive for numbness. Negative for extremity weakness.  ?Hematological:  Negative for adenopathy.  ?Psychiatric/Behavioral:  Negative for confusion. The patient is nervous/anxious.   ? ?  MEDICAL HISTORY:  ?Past Medical History:  ?Diagnosis Date  ? Anxiety   ? Aortic atherosclerosis ([place]) 10/24/2016  ? Breast cancer ([place])   ? GERD (gastroesophageal reflux disease)   ? ? ?SURGICAL HISTORY: ?Past Surgical  History:  ?Procedure Laterality Date  ? BREAST BIOPSY    ? ? ?SOCIAL HISTORY: ?Social History  ? ?Socioeconomic History  ? Marital status: Widowed  ?  Spouse name: Not on file  ? Number of children: Not on file  ? Years of education: Not on file  ? Highest education level: Not on file  ?Occupational History  ? Not on file  ?Tobacco Use  ? Smoking status: Every Day  ?  Packs/day: 1.00  ?  Years: 36.00  ?  Pack years: 36.00  ?  Types: Cigarettes  ?  Start date: 03/15/1979  ? Smokeless tobacco: Never  ?Vaping Use  ? Vaping Use: Never used  ?Substance and Sexual Activity  ? Alcohol use: No  ?  Alcohol/week: 3.0 standard drinks  ?  Types: 3 Standard drinks or equivalent per week  ? Drug use: Not Currently  ? Sexual activity: Not Currently  ?  Birth control/protection: None  ?Other Topics Concern  ? Not on file  ?Social History Narrative  ? ** Merged History Encounter **  ?    ? ?Social Determinants of Health  ? ?Financial Resource Strain: Not on file  ?Food Insecurity: Not on file  ?Transportation Needs: Not on file  ?Physical Activity: Not on file  ?Stress: Not on file  ?Social Connections: Not on file  ?Intimate Partner Violence: Not on file  ? ? ?FAMILY HISTORY: ?Family History  ?Problem Relation Age of Onset  ? Cancer Mother 71  ?     breast  ? Cancer Father   ?     melonma  ? Cirrhosis Maternal Grandfather   ? Cancer Paternal Grandmother   ?     skin/breast  ? ? ?ALLERGIES:  has no allergies on file. ? ?MEDICATIONS:  ?Current Outpatient Medications  ?Medication Sig Dispense Refill  ? calcium-vitamin D (OSCAL WITH D) 500-5 MG-MCG tablet Take 2 tablets by mouth daily. 60 tablet 5  ? dexamethasone (DECADRON) 4 MG tablet TAKE 2 TABLETS BY MOUTH ONCE DAILY FOR 2 DAYS AFTER  CHEMOTHERAPY.  TAKE  WITH  FOOD. 15 tablet 0  ? diphenoxylate-atropine (LOMOTIL) 2.5-0.025 MG tablet Take 1 tablet by mouth 4 (four) times daily as needed for diarrhea or loose stools. 30 tablet 0  ? folic acid (FOLVITE) 1 MG tablet Take 1 tablet (1 mg  total) by mouth daily. 30 tablet 0  ? gabapentin (NEURONTIN) 100 MG capsule Take 1 capsule (100 mg total) by mouth 3 (three) times daily. 90 capsule 0  ? guaiFENesin (MUCINEX) 600 MG 12 hr tablet Take 2 tablets (1,200 mg total) by mouth 2 (two) times daily. 60 tablet 0  ? ibuprofen (ADVIL) 200 MG tablet Take 200 mg by mouth every 6 (six) hours as needed for headache.    ? lidocaine-prilocaine (EMLA) cream Apply to affected area once 30 g 3  ? loperamide (IMODIUM) 2 MG capsule Take 1 capsule (2 mg total) by mouth See admin instructions. Initial: 4 mg, followed by 2 mg after each loose stool; maximum: 16 mg/day 90 capsule 1  ? LORazepam (ATIVAN) 0.5 MG tablet Take 1 tablet (0.5 mg total) by mouth every 12 (twelve) hours as needed for anxiety (nausea). 15 tablet 0  ? magic mouthwash (multi-ingredient) oral suspension Take  5 mLs by mouth 4 (four) times daily as needed for mouth pain. 480 mL 1  ? magnesium chloride (SLOW-MAG) 64 MG TBEC SR tablet Take 1 tablet (64 mg total) by mouth 2 (two) times daily. 60 tablet 2  ? Multiple Vitamin (MULTIVITAMIN WITH MINERALS) TABS tablet Take 1 tablet by mouth daily. 30 tablet 0  ? ondansetron (ZOFRAN) 8 MG tablet TAKE 1 TABLET BY MOUTH TWICE DAILY AS NEEDED. START THE THIRD DAY AFTER CARBOPLATIN AND AC CHEMOTHERAPY. 30 tablet 0  ? potassium chloride SA (KLOR-CON M) 20 MEQ tablet Take 1 tablet (20 mEq total) by mouth 2 (two) times daily. 60 tablet 1  ? prochlorperazine (COMPAZINE) 10 MG tablet Take 1 tablet (10 mg total) by mouth every 6 (six) hours as needed (Nausea or vomiting). 30 tablet 1  ? thiamine 100 MG tablet Take 0.5 tablets (50 mg total) by mouth daily. 15 tablet 0  ? VENTOLIN HFA 108 (90 Base) MCG/ACT inhaler Inhale 2 puffs into the lungs 4 (four) times daily as needed.    ? fluconazole (DIFLUCAN) 150 MG tablet Take 1 tablet (150 mg total) by mouth See admin instructions. Take one tablet, and repeat the second dose in 3 days. (Patient not taking: Reported on 06/01/2021) 2  tablet 0  ? omeprazole (PRILOSEC) 20 MG capsule Take 1 capsule (20 mg total) by mouth 2 (two) times daily before a meal. 60 capsule 1  ? ?No current facility-administered medications for this visit.  ? ? ?

## 2021-06-22 NOTE — Telephone Encounter (Signed)
Patient called asking for results of labs from today ? ?Urinalysis, Complete w Microscopic ?Order: [id number] ?Status: Final result    ?Visible to patient: No (inaccessible in MyChart)    ?Next appt: 06/24/2021 at 01:15 PM in Oncology ([facility] INJECTION)    ?Dx: Dysuria    ?0 Result Notes ?         ?Component Ref Range & Units 10:41 ?(06/22/21) 2 mo ago ?(04/06/21) 1 yr ago ?(02/21/20) 4 yr ago ?(10/16/16) 4 yr ago ?(10/14/16) 7 yr ago ?(02/16/14)  ?Color, Urine YELLOW YELLOW Abnormal   YELLOW  YELLOW Abnormal   YELLOW Abnormal   YELLOW Abnormal     ?APPearance CLEAR CLOUDY Abnormal   CLEAR Abnormal   HAZY Abnormal   CLEAR Abnormal   CLEAR Abnormal     ?Specific Gravity, Urine 1.005 - 1.030 1.012  1.010  1.015  1.003 Low   1.013    ?pH 5.0 - 8.0 7.0  5.5  5.0  7.0  6.0  5.0 R   ?Glucose, UA NEGATIVE mg/dL NEGATIVE  NEGATIVE  NEGATIVE  NEGATIVE  50 Abnormal     ?Hgb urine dipstick NEGATIVE NEGATIVE  NEGATIVE  LARGE Abnormal   NEGATIVE  MODERATE Abnormal     ?Bilirubin Urine NEGATIVE NEGATIVE  NEGATIVE  NEGATIVE  NEGATIVE  NEGATIVE    ?Ketones, ur NEGATIVE mg/dL NEGATIVE  NEGATIVE  80 Abnormal   NEGATIVE  80 Abnormal     ?Protein, ur NEGATIVE mg/dL 100 Abnormal   NEGATIVE  100 Abnormal   NEGATIVE  100 Abnormal   >=500 R   ?Nitrite NEGATIVE NEGATIVE  NEGATIVE  NEGATIVE  NEGATIVE  NEGATIVE  Negative   ?Leukocytes,Ua NEGATIVE LARGE Abnormal   NEGATIVE  NEGATIVE      ?RBC / HPF 0 - 5 RBC/hpf 0-5     0-5    ?WBC, UA 0 - 5 WBC/hpf >50 High   0-5  0-5   0-5    ?Bacteria, UA NONE SEEN RARE Abnormal   RARE Abnormal   RARE Abnormal    NONE SEEN    ?Squamous Epithelial / LPF 0 - 5 0-5  0-5  0-5   0-5 Abnormal  R    ?Comment: Performed at [facility], [address], [place], [place] [address]  ?Resulting Agency  [place] [facility] [place] [facility] [place] [facility] [place] [facility] [place] [facility] CONVERSION  ?  ? ?  ?  ?Specimen Collected: 06/22/21 10:41 Last Resulted: 06/22/21 11:31  ?  ?  Lab Flowsheet   ? Order Details   ? View Encounter    ? Lab and Collection Details   ? Routing   ? Result History    ?View Encounter Conversation    ?  ?R=Reference range differs from displayed range    ?  ?Result Care Coordination ? ? ?Patient Communication ? ? Add Comments   Not seen Back to Top  ?  ?  ? ?Other Results from 06/22/2021 ? ?Urine Culture ?Order: [id number] ?Status: In process    ?Visible to patient: No (not released)    ?Next appt: 06/24/2021 at 01:15 PM in Oncology ([facility] INJECTION)    ?Dx: Dysuria    ?Specimen Information: Urine  ?0 Result Notes ?  ?  ?Specimen Collected: 06/22/21 10:41 Last Resulted: 06/22/21 12:37  ?  ? Order Details   ? View Encounter   ? Lab and Collection Details   ? Routing   ? Result History    ?  View Encounter Conversation    ?  ?  ?Result Care Coordination ? ? ?Patient Communication ? ? Not Released  Not seen Back to Top  ?  ?  ? ?  ? Contains abnormal data Comprehensive metabolic panel ?Order: [id number] ?Status: Final result    ?Visible to patient: No (inaccessible in MyChart)    ?Next appt: 06/24/2021 at 01:15 PM in Oncology ([facility] INJECTION)    ?Dx: Invasive ductal carcinoma of right br...    ?0 Result Notes ?          ?Component Ref Range & Units 07:53 ?(06/22/21) 7 d ago ?(06/15/21) 2 wk ago ?(06/08/21) 3 wk ago ?(06/01/21) 4 wk ago ?(05/25/21) 1 mo ago ?(05/18/21) 1 mo ago ?(05/11/21)  ?Sodium 135 - 145 mmol/L 133 Low   132 Low   135  130 Low   132 Low   133 Low   134 Low    ?Potassium 3.5 - 5.1 mmol/L 3.4 Low   3.6  3.9  3.3 Low   4.0  4.0  3.2 Low    ?Chloride 98 - 111 mmol/L 101  99  100  95 Low   104  100  99   ?CO2 22 - 32 mmol/L 20 Low   '23  25  21 '$ Low   21 Low   25  23   ?Glucose, Bld 70 - 99 mg/dL 113 High   96 CM  91 CM  125 High  CM  92 CM  86 CM  90 CM   ?Comment: Glucose reference range applies only to samples taken after fasting for at least 8 hours.  ?BUN 8 - 23 mg/dL '15  10  9  21  '$ <5 Low   <5 Low   11   ?Creatinine, Ser 0.44 - 1.00 mg/dL 0.85  0.69  0.85  0.78  0.63  0.58  0.66   ?Calcium 8.9 - 10.3 mg/dL 9.0   9.1  9.1  9.3  8.6 Low   9.1  8.9   ?Total Protein 6.5 - 8.1 g/dL 6.9  6.5  6.6  7.2  6.6  6.6  6.4 Low    ?Albumin 3.5 - 5.0 g/dL 3.9  3.6  3.6  3.8  3.5  3.7  3.6   ?AST 15 - 41 U/L '24  18  17  18  12 '$ Low   16  23   ?ALT 0 - 44 U/L '14  11  9  9  8  11  13   '$ ?Alkaline Phosphatase 38 - 126 U/L 62  53  63  66  60  50  54   ?Total Bilirubin 0.3 - 1.2 mg/dL 0.5  0.3  0.1 Low   0.5  <0.1 Low   0.3  <0.1 Low    ?GFR, Estimated >60 mL/min >60  >60 CM  >60 CM  >60 CM  >60 CM  >60 CM  >60 CM   ?Comment: (NOTE)  ?Calculated using the CKD-EPI Creatinine Equation (2021)   ?Anion gap 5 - '15 12  10 '$ CM  10 CM  14 CM  7 CM  8 CM  12 CM   ?Comment: Performed at [facility], [address] [place], [place] [address]  ?Resulting Agency  [place] [facility] [place] [facility] [place] [facility] [place] [facility] [place] [facility] [place] [facility] [place] [facility]  ?  ? ?  ?  ?Specimen Collected: 06/22/21 07:53 Last Resulted: 06/22/21 08:33  ?  ?  Lab Flowsheet   ? Order Details   ? View Encounter   ? Lab and Collection Details   ? Routing   ? Result History    ?View Encounter Conversation    ?  ?CM=Additional comments    ?  ?Result Care Coordination ? ? ?Patient Communication ? ? Add Comments   Not seen Back to Top  ?  ?  ? ?  ? Contains abnormal data CBC with Differential ?Order: [id number] ?Status: Final result    ?Visible to patient: No (inaccessible in MyChart)    ?Next appt: 06/24/2021 at 01:15 PM in Oncology ([facility] INJECTION)    ?Dx: Invasive ductal carcinoma of right br...    ?0 Result Notes ?          ?Component Ref Range & Units 07:53 ?(06/22/21) 7 d ago ?(06/15/21) 2 wk ago ?(06/08/21) 3 wk ago ?(06/01/21) 4 wk ago ?(05/25/21) 1 mo ago ?(05/18/21) 1 mo ago ?(05/11/21)  ?WBC 4.0 - 10.5 K/uL 11.1 High   12.8 High   11.2 High   3.5 Low   5.5  4.3  8.0   ?RBC 3.87 - 5.11 MIL/uL 2.92 Low   2.61 Low   2.67 Low   2.65 Low   2.61 Low   2.77 Low   2.79 Low    ?Hemoglobin 12.0 - 15.0 g/dL 10.2 Low   9.1 Low   9.2 Low   9.0 Low   8.7 Low   9.3 Low   9.3 Low    ?HCT 36.0 - 46.0  % 30.8 Low   27.0 Low   27.6 Low   26.3 Low   26.4 Low   27.4 Low   27.3 Low    ?MCV 80.0 - 100.0 fL 105.5 High   103.4 High   103.4 High   99.2  101.1 High   98.9  97.8   ?MCH 26.0 - 34.0 pg 34.9 High   34.9 High   34.5 High   34.0  33.3  33.6  33.3   ?MCHC 30.0 - 36.0 g/dL 33.1  33.7  33.3  34.2  33.0  33.9  34.1   ?RDW 11.5 - 15.5 % 19.9 High   20.7 High   22.1 High   20.3 High   22.2 High   21.2 High   20.1 High    ?Platelets 150 - 400 K/uL 171  193  393  301  127 Low   201  420 High    ?nRBC 0.0 - 0.2 % 0.2  0.0  1.5 High   0.0  0.0  0.0  0.0   ?Neutrophils Relative % % 23  51  32  44  43  30  47   ?Neutro Abs 1.7 - 7.7 K/uL 2.5  6.5  3.5  1.5 Low   2.4  1.3 Low   3.8   ?Lymphocytes Relative % 61  43  50  46  46  64  50   ?Lymphs Abs 0.7 - 4.0 K/uL 7.0 High   5.5 High   5.5 High   1.6  2.6  2.7  3.9   ?Monocytes Relative % '6  5  12  7  9  6  2   '$ ?Monocytes Absolute 0.1 - 1.0 K/uL 0.6  0.6  1.4 High   0.3  0.5  0.3  0.2   ?Eosinophils Relative % 1  0  0  1  0  0  0   ?Eosinophils Absolute  0.0 - 0.5 K/uL 0.1  0.0  0.0  0.0  0.0  0.0  0.0   ?Basophils Relative % 1  0  0  1  1  0  0   ?Basophils Absolute 0.0 - 0.1 K/uL 0.1  0.0  0.0  0.0  0.0  0.0  0.0   ?Immature Granulocytes % '8  1  6  1  1  '$ 0  1   ?Comment: Increased IG's, likely caused by Bone Marrow Colony Stimulating Factor received within 30 days.  ?Abs Immature Granulocytes 0.00 - 0.07 K/uL 0.84 High   0.13 High  CM  0.72 High  CM  0.04 CM  0.07 CM  0.01 CM  0.08 High  CM   ?Comment: Performed at [facility], [address] [place], [place] [address]  ?Resulting Agency  [place] [facility] [place] [facility] [place] [facility] [place] [facility] [place] [facility] [place] [facility] [place] [facility]  ?  ? ?  ?  ?Specimen Collected: 06/22/21 07:53 Last Resulted: 06/22/21 08:17  ?  ?  ?Magnesium ?Order: [id number] ?Status: Final result    ?Visible to patient: No (inaccessible in MyChart)    ?Next appt: 06/24/2021 at 01:15 PM in Oncology ([facility] INJECTION)    ?Dx: Invasive ductal carcinoma of right  br...    ?0 Result Notes ?          ?Component Ref Range & Units 07:53 ?(06/22/21) 7 d ago ?(06/15/21) 2 wk ago ?(06/08/21) 3 wk ago ?(06/01/21) 4 wk ago ?(05/25/21) 1 mo ago ?(05/18/21) 1 mo ago ?(05/11/21)

## 2021-06-23 ENCOUNTER — Encounter: Payer: Self-pay | Admitting: Oncology

## 2021-06-23 LAB — URINE CULTURE: Culture: NO GROWTH

## 2021-06-23 NOTE — Telephone Encounter (Signed)
Dr. Tasia Catchings Please advise ?

## 2021-06-24 ENCOUNTER — Inpatient Hospital Stay: Payer: Medicaid Other

## 2021-06-28 ENCOUNTER — Telehealth: Payer: Self-pay | Admitting: *Deleted

## 2021-06-28 ENCOUNTER — Other Ambulatory Visit: Payer: Self-pay | Admitting: Oncology

## 2021-06-28 NOTE — Telephone Encounter (Signed)
Patient called and is asking what is next. She does not have a follow up appointment scheduled as last disposition said follow up TBD. ?

## 2021-06-28 NOTE — Telephone Encounter (Signed)
Patient states she no longer has diarrhea as of 2 days ago and her pain is better after taking the medicine she was given. She does report that she is sleeping a lot, but in general she is feeling better ?

## 2021-06-28 NOTE — Telephone Encounter (Signed)
Please advise Dr. Tasia Catchings ?

## 2021-06-28 NOTE — Telephone Encounter (Signed)
Hassan Rowan will you call patient back per Dr. Tasia Catchings to see if patient is still having diarrhea.  ?

## 2021-06-28 NOTE — Telephone Encounter (Signed)
Per Dr. Tasia Catchings, please schedule patient to come back next week for labs/md/AC+keytruda, day 3 udenyca. Thank you ? ?

## 2021-07-05 ENCOUNTER — Ambulatory Visit
Admission: RE | Admit: 2021-07-05 | Discharge: 2021-07-05 | Disposition: A | Discharge status: Home / Self Care | Payer: Medicaid Other | Source: Ambulatory Visit | Attending: Oncology | Admitting: Oncology

## 2021-07-05 ENCOUNTER — Other Ambulatory Visit: Payer: Self-pay | Admitting: Oncology

## 2021-07-05 DIAGNOSIS — C50911 Malignant neoplasm of unspecified site of right female breast: Secondary | ICD-10-CM

## 2021-07-05 HISTORY — DX: Personal history of antineoplastic chemotherapy: Z92.21

## 2021-07-07 ENCOUNTER — Inpatient Hospital Stay: Payer: Medicaid Other

## 2021-07-07 ENCOUNTER — Encounter: Payer: Self-pay | Admitting: Oncology

## 2021-07-07 ENCOUNTER — Inpatient Hospital Stay (HOSPITAL_BASED_OUTPATIENT_CLINIC_OR_DEPARTMENT_OTHER): Payer: Medicaid Other | Admitting: Oncology

## 2021-07-07 VITALS — BP 142/86 | HR 93 | Temp 98.0°F | Wt 105.0 lb

## 2021-07-07 DIAGNOSIS — C801 Malignant (primary) neoplasm, unspecified: Secondary | ICD-10-CM | POA: Insufficient documentation

## 2021-07-07 DIAGNOSIS — Z5111 Encounter for antineoplastic chemotherapy: Secondary | ICD-10-CM

## 2021-07-07 DIAGNOSIS — Z5112 Encounter for antineoplastic immunotherapy: Secondary | ICD-10-CM | POA: Diagnosis not present

## 2021-07-07 DIAGNOSIS — C50911 Malignant neoplasm of unspecified site of right female breast: Secondary | ICD-10-CM

## 2021-07-07 DIAGNOSIS — E876 Hypokalemia: Secondary | ICD-10-CM | POA: Diagnosis not present

## 2021-07-07 DIAGNOSIS — R52 Pain, unspecified: Secondary | ICD-10-CM

## 2021-07-07 DIAGNOSIS — G620 Drug-induced polyneuropathy: Secondary | ICD-10-CM

## 2021-07-07 DIAGNOSIS — Z95828 Presence of other vascular implants and grafts: Secondary | ICD-10-CM | POA: Insufficient documentation

## 2021-07-07 DIAGNOSIS — F411 Generalized anxiety disorder: Secondary | ICD-10-CM | POA: Insufficient documentation

## 2021-07-07 DIAGNOSIS — T451X5A Adverse effect of antineoplastic and immunosuppressive drugs, initial encounter: Secondary | ICD-10-CM

## 2021-07-07 LAB — CBC WITH DIFFERENTIAL/PLATELET
Abs Immature Granulocytes: 0.04 10*3/uL (ref 0.00–0.07)
Basophils Absolute: 0 10*3/uL (ref 0.0–0.1)
Basophils Relative: 0 %
Eosinophils Absolute: 0.1 10*3/uL (ref 0.0–0.5)
Eosinophils Relative: 1 %
HCT: 33 % — ABNORMAL LOW (ref 36.0–46.0)
Hemoglobin: 10.7 g/dL — ABNORMAL LOW (ref 12.0–15.0)
Immature Granulocytes: 1 %
Lymphocytes Relative: 52 %
Lymphs Abs: 4.5 10*3/uL — ABNORMAL HIGH (ref 0.7–4.0)
MCH: 35.4 pg — ABNORMAL HIGH (ref 26.0–34.0)
MCHC: 32.4 g/dL (ref 30.0–36.0)
MCV: 109.3 fL — ABNORMAL HIGH (ref 80.0–100.0)
Monocytes Absolute: 0.5 10*3/uL (ref 0.1–1.0)
Monocytes Relative: 6 %
Neutro Abs: 3.5 10*3/uL (ref 1.7–7.7)
Neutrophils Relative %: 40 %
Platelets: 348 10*3/uL (ref 150–400)
RBC: 3.02 MIL/uL — ABNORMAL LOW (ref 3.87–5.11)
RDW: 17.2 % — ABNORMAL HIGH (ref 11.5–15.5)
WBC: 8.6 10*3/uL (ref 4.0–10.5)
nRBC: 0 % (ref 0.0–0.2)

## 2021-07-07 LAB — COMPREHENSIVE METABOLIC PANEL
ALT: 10 U/L (ref 0–44)
AST: 23 U/L (ref 15–41)
Albumin: 3.7 g/dL (ref 3.5–5.0)
Alkaline Phosphatase: 50 U/L (ref 38–126)
Anion gap: 8 (ref 5–15)
BUN: 10 mg/dL (ref 8–23)
CO2: 23 mmol/L (ref 22–32)
Calcium: 8.9 mg/dL (ref 8.9–10.3)
Chloride: 104 mmol/L (ref 98–111)
Creatinine, Ser: 0.64 mg/dL (ref 0.44–1.00)
GFR, Estimated: >60 mL/min (ref >60)
Glucose, Bld: 86 mg/dL (ref 70–99)
Potassium: 3.5 mmol/L (ref 3.5–5.1)
Sodium: 135 mmol/L (ref 135–145)
Total Bilirubin: 0.1 mg/dL — ABNORMAL LOW (ref 0.3–1.2)
Total Protein: 6.7 g/dL (ref 6.5–8.1)

## 2021-07-07 MED ORDER — HEPARIN SOD (PORK) LOCK FLUSH 100 UNIT/ML IV SOLN
500.0000 [IU] | Freq: Once | INTRAVENOUS | Status: DC | PRN
  Filled 2021-07-07: qty 5

## 2021-07-07 MED ORDER — POTASSIUM CHLORIDE CRYS ER 20 MEQ PO TBCR
20.0000 meq | EXTENDED_RELEASE_TABLET | Freq: Two times a day (BID) | ORAL | 1 refills | Status: DC
Start: 2021-07-07 — End: 2021-08-18

## 2021-07-07 MED ORDER — SODIUM CHLORIDE 0.9 % IV SOLN
600.0000 mg/m2 | Freq: Once | INTRAVENOUS | Status: AC
  Administered 2021-07-07: 860 mg via INTRAVENOUS
  Filled 2021-07-07: qty 43

## 2021-07-07 MED ORDER — HEPARIN SOD (PORK) LOCK FLUSH 100 UNIT/ML IV SOLN
INTRAVENOUS | Status: AC
  Administered 2021-07-07: 500 [IU]
  Filled 2021-07-07: qty 5

## 2021-07-07 MED ORDER — SODIUM CHLORIDE 0.9 % IV SOLN
Freq: Once | INTRAVENOUS | Status: AC
  Filled 2021-07-07: qty 250

## 2021-07-07 MED ORDER — DOXORUBICIN HCL CHEMO IV INJECTION 2 MG/ML
60.0000 mg/m2 | Freq: Once | INTRAVENOUS | Status: AC
  Administered 2021-07-07: 86 mg via INTRAVENOUS
  Filled 2021-07-07: qty 43

## 2021-07-07 MED ORDER — SODIUM CHLORIDE 0.9 % IV SOLN
200.0000 mg | Freq: Once | INTRAVENOUS | Status: AC
  Administered 2021-07-07: 200 mg via INTRAVENOUS
  Filled 2021-07-07: qty 8

## 2021-07-07 MED ORDER — PALONOSETRON HCL INJECTION 0.25 MG/5ML
0.2500 mg | Freq: Once | INTRAVENOUS | Status: AC
  Administered 2021-07-07: 0.25 mg via INTRAVENOUS
  Filled 2021-07-07: qty 5

## 2021-07-07 MED ORDER — SODIUM CHLORIDE 0.9 % IV SOLN
10.0000 mg | Freq: Once | INTRAVENOUS | Status: AC
  Administered 2021-07-07: 10 mg via INTRAVENOUS
  Filled 2021-07-07: qty 1
  Filled 2021-07-07: qty 10

## 2021-07-07 MED ORDER — SODIUM CHLORIDE 0.9 % IV SOLN
150.0000 mg | Freq: Once | INTRAVENOUS | Status: AC
  Administered 2021-07-07: 150 mg via INTRAVENOUS
  Filled 2021-07-07: qty 150
  Filled 2021-07-07: qty 5

## 2021-07-07 NOTE — Progress Notes (Signed)
?Hematology/Oncology Progress note ?[phone] [phone] ?  ? ?   ? ? ?Patient Care Team: ?[name], NP as PCP - General (Nurse Practitioner) ? ?REFERRING PROVIDER: ?[name], NP  ?CHIEF COMPLAINTS/REASON FOR VISIT:  ?Follow-up for triple negative right breast cancer. ? ?HISTORY OF PRESENTING ILLNESS:  ? ?Yvonne Scott is a  61 y.o.  female with PMH listed below was seen in consultation at the request of  [name], NP  for evaluation of breast cancer. ? ?August 2022, patient was diagnosed with right breast stage IIIb cT3 N0M0, grade 2, ER negative, PR negative HER2 negative breast cancer.  Patient self palpated breast mass many years ago.  10/21/2020 diagnostic mammogram and ultrasound confirmed presence of mass and a subsequent biopsy revealed right breast triple negative cancer. ?There was plan for patient to start with neoadjuvant chemotherapy followed by surgery and radiation.  Unfortunately patient never started treatment.  Patient was accompanied by her mother.  They want to transfer her oncology care to locally. ? ?Patient reports having right breast pain as well as generalized pain.  Patient had a [name] placed 3 weeks ago and she reports some swelling around the sites.  Patient has had negative genetic testing at [facility]. ? ?Patient has a history of alcohol dependence.  Per patient she has not been drinking recently.  Everyday smoker. ? ? ?02/24/2021, CT chest abdomen pelvis showed large right breast mass, 5.5 x 4.8 cm with small right axillary lymph node with variable degrees of enhancement potentially involved but not specific based on size.  This tracked down to retropectoral nodal stations largest lymph node along the lateral margin of the pectoralis major.  Tiny pulmonary nodules in the chest as discussed.  These are nonspecific but would consider short interval follow-up to assess for any changes.  No evidence of metastatic disease involving the  abdomen or pelvis.  Aortic atherosclerosis. ? ?02/25/2021, echocardiogram showed LVEF 65 to 70%.  Patient has been to chemotherapy class and understands antiemetics instructions. ? ?03/16/2021, bone scan showed no evidence of osseous metastatic breast cancer, asymmetric osseous uptake on the right at L5-S1, corresponding to degenerative disc disease on recent CT.  Asymmetric soft tissue uptake in the right breast. ? ?INTERVAL HISTORY ?Yvonne Scott is a 62 y.o. female who has above history reviewed by me today presents for follow up visit for management of triple negative breast cancer, stage IIIB ?Diarrhea has stopped.  Patient did not submit sample for C. difficile testing.  Patient has no new complaints.  She has gained 4 pounds since last visit. ?She also finished a course of UTI treatments.  Dysuria symptom has resolved.  No fever or chills or flank pain today.  No nausea vomiting. ? ?07/05/2021, right diagnostic mammogram and ultrasound showed interval decrease of the size of the right breast mass, currently 4.6 x 3.6 x 2.6, previously 5.1 x 4.4 x 3.5.  No suspicious right axillary lymphadenopathy on examination. ? ? ? ?Review of Systems  ?Constitutional:  Positive for fatigue. Negative for appetite change, chills and fever.  ?HENT:   Negative for hearing loss, mouth sores and voice change.   ?Eyes:  Negative for eye problems.  ?Respiratory:  Negative for chest tightness and cough.   ?Cardiovascular:  Negative for chest pain.  ?Gastrointestinal:  Negative for abdominal distention, abdominal pain, blood in stool, diarrhea and nausea.  ?Endocrine: Negative for hot flashes.  ?Genitourinary:  Negative for difficulty urinating, dysuria and frequency.   ?  Musculoskeletal:  Negative for arthralgias.  ?Skin:  Negative for itching and rash.  ?Neurological:  Positive for numbness. Negative for extremity weakness.  ?Hematological:  Negative for adenopathy.  ?Psychiatric/Behavioral:  Negative for confusion. The patient is  not nervous/anxious.   ? ?MEDICAL HISTORY:  ?Past Medical History:  ?Diagnosis Date  ? Anxiety   ? Aortic atherosclerosis ([place]) 10/24/2016  ? Breast cancer ([place])   ? GERD (gastroesophageal reflux disease)   ? Personal history of chemotherapy   ? ? ?SURGICAL HISTORY: ?Past Surgical History:  ?Procedure Laterality Date  ? BREAST BIOPSY    ? ? ?SOCIAL HISTORY: ?Social History  ? ?Socioeconomic History  ? Marital status: Widowed  ?  Spouse name: Not on file  ? Number of children: Not on file  ? Years of education: Not on file  ? Highest education level: Not on file  ?Occupational History  ? Not on file  ?Tobacco Use  ? Smoking status: Every Day  ?  Packs/day: 1.00  ?  Years: 36.00  ?  Pack years: 36.00  ?  Types: Cigarettes  ?  Start date: 03/15/1979  ? Smokeless tobacco: Never  ?Vaping Use  ? Vaping Use: Never used  ?Substance and Sexual Activity  ? Alcohol use: No  ?  Alcohol/week: 3.0 standard drinks  ?  Types: 3 Standard drinks or equivalent per week  ? Drug use: Not Currently  ? Sexual activity: Not Currently  ?  Birth control/protection: None  ?Other Topics Concern  ? Not on file  ?Social History Narrative  ? ** Merged History Encounter **  ?    ? ?Social Determinants of Health  ? ?Financial Resource Strain: Not on file  ?Food Insecurity: Not on file  ?Transportation Needs: Not on file  ?Physical Activity: Not on file  ?Stress: Not on file  ?Social Connections: Not on file  ?Intimate Partner Violence: Not on file  ? ? ?FAMILY HISTORY: ?Family History  ?Problem Relation Age of Onset  ? Cancer Mother 71  ?     breast  ? Cancer Father   ?     melonma  ? Cirrhosis Maternal Grandfather   ? Cancer Paternal Grandmother   ?     skin/breast  ? ? ?ALLERGIES:  has No Known Allergies. ? ?MEDICATIONS:  ?Current Outpatient Medications  ?Medication Sig Dispense Refill  ? calcium-vitamin D (OSCAL WITH D) 500-5 MG-MCG tablet Take 2 tablets by mouth daily. 60 tablet 5  ? dexamethasone (DECADRON) 4 MG tablet TAKE 2 TABLETS BY MOUTH  ONCE DAILY FOR 2 DAYS AFTER  CHEMOTHERAPY.  TAKE  WITH  FOOD. 15 tablet 0  ? diphenoxylate-atropine (LOMOTIL) 2.5-0.025 MG tablet Take 1 tablet by mouth 4 (four) times daily as needed for diarrhea or loose stools. 30 tablet 0  ? folic acid (FOLVITE) 1 MG tablet Take 1 tablet (1 mg total) by mouth daily. 30 tablet 0  ? gabapentin (NEURONTIN) 100 MG capsule Take 1 capsule (100 mg total) by mouth 3 (three) times daily. 90 capsule 0  ? guaiFENesin (MUCINEX) 600 MG 12 hr tablet Take 2 tablets (1,200 mg total) by mouth 2 (two) times daily. 60 tablet 0  ? ibuprofen (ADVIL) 200 MG tablet Take 200 mg by mouth every 6 (six) hours as needed for headache.    ? lidocaine-prilocaine (EMLA) cream Apply to affected area once 30 g 3  ? loperamide (IMODIUM) 2 MG capsule Take 1 capsule (2 mg total) by mouth See admin instructions. Initial: 4 mg,  followed by 2 mg after each loose stool; maximum: 16 mg/day 90 capsule 1  ? LORazepam (ATIVAN) 0.5 MG tablet Take 1 tablet (0.5 mg total) by mouth every 12 (twelve) hours as needed for anxiety (nausea). 15 tablet 0  ? magic mouthwash (multi-ingredient) oral suspension Take 5 mLs by mouth 4 (four) times daily as needed for mouth pain. 480 mL 1  ? magnesium chloride (SLOW-MAG) 64 MG TBEC SR tablet Take 1 tablet (64 mg total) by mouth 2 (two) times daily. 60 tablet 2  ? Multiple Vitamin (MULTIVITAMIN WITH MINERALS) TABS tablet Take 1 tablet by mouth daily. 30 tablet 0  ? omeprazole (PRILOSEC) 20 MG capsule Take 1 capsule (20 mg total) by mouth 2 (two) times daily before a meal. 60 capsule 1  ? ondansetron (ZOFRAN) 8 MG tablet TAKE 1 TABLET BY MOUTH TWICE DAILY AS NEEDED. START THE THIRD DAY AFTER CARBOPLATIN AND AC CHEMOTHERAPY. 30 tablet 0  ? prochlorperazine (COMPAZINE) 10 MG tablet Take 1 tablet (10 mg total) by mouth every 6 (six) hours as needed (Nausea or vomiting). 30 tablet 1  ? thiamine 100 MG tablet Take 0.5 tablets (50 mg total) by mouth daily. 15 tablet 0  ? VENTOLIN HFA 108 (90 Base)  MCG/ACT inhaler Inhale 2 puffs into the lungs 4 (four) times daily as needed.    ? fluconazole (DIFLUCAN) 150 MG tablet Take 1 tablet (150 mg total) by mouth See admin instructions. Take one tablet, and rep

## 2021-07-07 NOTE — Progress Notes (Signed)
Nutrition Follow-up: ? ?Patient with triple negative breast cancer.   ?Treatment has been on hold.  Planning to restart Beryle Flock, adriamyacin, cytoxan.   ? ?Met with patient during infusion.  Patient reports that her appetite has been improved and diarrhea has stopped in the last few days.  "I have been eating everything in sight."  Ate 2 hot dogs all the way yesterday, chips and dip, cookies."  I have been cooking vegetables."  Has not restarted shakes because for fear of causing diarrhea.   ? ?Reports UTI symptoms have improved ? ?Medications: reviewed ? ?Labs: reviewed ? ?Anthropometrics:  ? ?Weight is 105 lb today ?101 lb on 4/11 ?104 lb on 3/28 ?107 lb on 2/7 ?109 lb on 1/10 ? ? ?NUTRITION DIAGNOSIS: Inadequate oral intake improving ? ? ?INTERVENTION:  ?Encouraged patient to continue to eat high calorie, high protein foods to promote weight gain.   ?Patient wants to hold off on adding supplements due to fear of diarrhea ? ?MONITORING, EVALUATION, GOAL: weight trends, intake ? ? ?NEXT VISIT: Wednesday, May 17 during infusion ? ?Pricella Gaugh B. Zenia Resides, RD, LDN ?Registered Dietitian ?[phone] ? ? ?

## 2021-07-07 NOTE — Patient Instructions (Addendum)
Doxorubicin injection ?What is this medication? ?DOXORUBICIN (dox oh ROO bi sin) is a chemotherapy drug. It is used to treat many kinds of cancer like leukemia, lymphoma, neuroblastoma, sarcoma, and Wilms' tumor. It is also used to treat bladder cancer, breast cancer, lung cancer, ovarian cancer, stomach cancer, and thyroid cancer. ?This medicine may be used for other purposes; ask your health care provider or pharmacist if you have questions. ?COMMON BRAND NAME(S): Adriamycin, Adriamycin PFS, Adriamycin RDF, Rubex ?What should I tell my care team before I take this medication? ?They need to know if you have any of these conditions: ?heart disease ?history of low blood counts caused by a medicine ?liver disease ?recent or ongoing radiation therapy ?an unusual or allergic reaction to doxorubicin, other chemotherapy agents, other medicines, foods, dyes, or preservatives ?pregnant or trying to get pregnant ?breast-feeding ?How should I use this medication? ?This drug is given as an infusion into a vein. It is administered in a hospital or clinic by a specially trained health care professional. If you have pain, swelling, burning or any unusual feeling around the site of your injection, tell your health care professional right away. ?Talk to your pediatrician regarding the use of this medicine in children. Special care may be needed. ?Overdosage: If you think you have taken too much of this medicine contact a poison control center or emergency room at once. ?NOTE: This medicine is only for you. Do not share this medicine with others. ?What if I miss a dose? ?It is important not to miss your dose. Call your doctor or health care professional if you are unable to keep an appointment. ?What may interact with this medication? ?This medicine may interact with the following medications: ?6-mercaptopurine ?paclitaxel ?phenytoin ?St. John's Wort ?trastuzumab ?verapamil ?This list may not describe all possible interactions. Give  your health care provider a list of all the medicines, herbs, non-prescription drugs, or dietary supplements you use. Also tell them if you smoke, drink alcohol, or use illegal drugs. Some items may interact with your medicine. ?What should I watch for while using this medication? ?This drug may make you feel generally unwell. This is not uncommon, as chemotherapy can affect healthy cells as well as cancer cells. Report any side effects. Continue your course of treatment even though you feel ill unless your doctor tells you to stop. ?There is a maximum amount of this medicine you should receive throughout your life. The amount depends on the medical condition being treated and your overall health. Your doctor will watch how much of this medicine you receive in your lifetime. Tell your doctor if you have taken this medicine before. ?You may need blood work done while you are taking this medicine. ?Your urine may turn red for a few days after your dose. This is not blood. If your urine is dark or brown, call your doctor. ?In some cases, you may be given additional medicines to help with side effects. Follow all directions for their use. ?Call your doctor or health care professional for advice if you get a fever, chills or sore throat, or other symptoms of a cold or flu. Do not treat yourself. This drug decreases your body's ability to fight infections. Try to avoid being around people who are sick. ?This medicine may increase your risk to bruise or bleed. Call your doctor or health care professional if you notice any unusual bleeding. ?Talk to your doctor about your risk of cancer. You may be more at risk for certain types  of cancers if you take this medicine. ?Do not become pregnant while taking this medicine or for 6 months after stopping it. Women should inform their doctor if they wish to become pregnant or think they might be pregnant. Men should not father a child while taking this medicine and for 6 months  after stopping it. There is a potential for serious side effects to an unborn child. Talk to your health care professional or pharmacist for more information. Do not breast-feed an infant while taking this medicine. ?This medicine has caused ovarian failure in some women and reduced sperm counts in some men This medicine may interfere with the ability to have a child. Talk with your doctor or health care professional if you are concerned about your fertility. ?This medicine may cause a decrease in Co-Enzyme Q-10. You should make sure that you get enough Co-Enzyme Q-10 while you are taking this medicine. Discuss the foods you eat and the vitamins you take with your health care professional. ?What side effects may I notice from receiving this medication? ?Side effects that you should report to your doctor or health care professional as soon as possible: ?allergic reactions like skin rash, itching or hives, swelling of the face, lips, or tongue ?breathing problems ?chest pain ?fast or irregular heartbeat ?low blood counts - this medicine may decrease the number of white blood cells, red blood cells and platelets. You may be at increased risk for infections and bleeding. ?pain, redness, or irritation at site where injected ?signs of infection - fever or chills, cough, sore throat, pain or difficulty passing urine ?signs of decreased platelets or bleeding - bruising, pinpoint red spots on the skin, black, tarry stools, blood in the urine ?swelling of the ankles, feet, hands ?tiredness ?weakness ?Side effects that usually do not require medical attention (report to your doctor or health care professional if they continue or are bothersome): ?diarrhea ?hair loss ?mouth sores ?nail discoloration or damage ?nausea ?red colored urine ?vomiting ?This list may not describe all possible side effects. Call your doctor for medical advice about side effects. You may report side effects to FDA at 1-800-FDA-1088. ?Where should I keep  my medication? ?This drug is given in a hospital or clinic and will not be stored at home. ?NOTE: This sheet is a summary. It may not cover all possible information. If you have questions about this medicine, talk to your doctor, pharmacist, or health care provider. ?? 2023 Elsevier/Gold Standard (2016-11-03 00:00:00) ?Cyclophosphamide Injection ?What is this medication? ?CYCLOPHOSPHAMIDE (sye kloe FOSS fa mide) is a chemotherapy drug. It slows the growth of cancer cells. This medicine is used to treat many types of cancer like lymphoma, myeloma, leukemia, breast cancer, and ovarian cancer, to name a few. ?This medicine may be used for other purposes; ask your health care provider or pharmacist if you have questions. ?COMMON BRAND NAME(S): Cyclophosphamide, Cytoxan, Neosar ?What should I tell my care team before I take this medication? ?They need to know if you have any of these conditions: ?heart disease ?history of irregular heartbeat ?infection ?kidney disease ?liver disease ?low blood counts, like white cells, platelets, or red blood cells ?on hemodialysis ?recent or ongoing radiation therapy ?scarring or thickening of the lungs ?trouble passing urine ?an unusual or allergic reaction to cyclophosphamide, other medicines, foods, dyes, or preservatives ?pregnant or trying to get pregnant ?breast-feeding ?How should I use this medication? ?This drug is usually given as an injection into a vein or muscle or by infusion into a  vein. It is administered in a hospital or clinic by a specially trained health care professional. ?Talk to your pediatrician regarding the use of this medicine in children. Special care may be needed. ?Overdosage: If you think you have taken too much of this medicine contact a poison control center or emergency room at once. ?NOTE: This medicine is only for you. Do not share this medicine with others. ?What if I miss a dose? ?It is important not to miss your dose. Call your doctor or health  care professional if you are unable to keep an appointment. ?What may interact with this medication? ?amphotericin B ?azathioprine ?certain antivirals for HIV or hepatitis ?certain medicines for blood pres

## 2021-07-09 ENCOUNTER — Inpatient Hospital Stay: Payer: Medicaid Other

## 2021-07-09 DIAGNOSIS — C50911 Malignant neoplasm of unspecified site of right female breast: Secondary | ICD-10-CM

## 2021-07-09 DIAGNOSIS — Z5112 Encounter for antineoplastic immunotherapy: Secondary | ICD-10-CM | POA: Diagnosis not present

## 2021-07-09 MED ORDER — PEGFILGRASTIM-CBQV 6 MG/0.6ML ~~LOC~~ SOSY
6.0000 mg | PREFILLED_SYRINGE | Freq: Once | SUBCUTANEOUS | Status: AC
  Administered 2021-07-09: 6 mg via SUBCUTANEOUS
  Filled 2021-07-09: qty 0.6

## 2021-07-13 ENCOUNTER — Ambulatory Visit
Admission: RE | Admit: 2021-07-13 | Discharge: 2021-07-13 | Disposition: A | Discharge status: Home / Self Care | Payer: Medicaid Other | Source: Ambulatory Visit | Attending: Oncology | Admitting: Oncology

## 2021-07-13 DIAGNOSIS — G620 Drug-induced polyneuropathy: Secondary | ICD-10-CM | POA: Diagnosis not present

## 2021-07-13 DIAGNOSIS — F419 Anxiety disorder, unspecified: Secondary | ICD-10-CM | POA: Insufficient documentation

## 2021-07-13 DIAGNOSIS — T451X5A Adverse effect of antineoplastic and immunosuppressive drugs, initial encounter: Secondary | ICD-10-CM | POA: Diagnosis not present

## 2021-07-13 DIAGNOSIS — Z0189 Encounter for other specified special examinations: Secondary | ICD-10-CM

## 2021-07-13 DIAGNOSIS — C50919 Malignant neoplasm of unspecified site of unspecified female breast: Secondary | ICD-10-CM | POA: Insufficient documentation

## 2021-07-13 LAB — ECHOCARDIOGRAM COMPLETE
AR max vel: 3.38 cm2
AV Area VTI: 4.19 cm2
AV Area mean vel: 3.2 cm2
AV Mean grad: 2 mmHg
AV Peak grad: 3.7 mmHg
Ao pk vel: 0.97 m/s
Area-P 1/2: 4.29 cm2
MV VTI: 4.23 cm2
S' Lateral: 2.4 cm

## 2021-07-13 NOTE — Progress Notes (Signed)
*  PRELIMINARY RESULTS* ?Echocardiogram ?2D Echocardiogram has been performed. ? ?Sharica Roedel, Sonia Side ?07/13/2021, 9:44 AM ?

## 2021-07-14 ENCOUNTER — Ambulatory Visit
Admission: RE | Admit: 2021-07-14 | Discharge: 2021-07-14 | Disposition: A | Discharge status: Home / Self Care | Payer: Medicaid Other | Source: Ambulatory Visit | Attending: Medical Oncology | Admitting: Medical Oncology

## 2021-07-14 ENCOUNTER — Inpatient Hospital Stay: Payer: Medicaid Other

## 2021-07-14 ENCOUNTER — Inpatient Hospital Stay: Payer: Medicaid Other | Attending: Internal Medicine | Admitting: Medical Oncology

## 2021-07-14 ENCOUNTER — Encounter: Payer: Self-pay | Admitting: Medical Oncology

## 2021-07-14 ENCOUNTER — Ambulatory Visit
Admission: RE | Admit: 2021-07-14 | Discharge: 2021-07-14 | Disposition: A | Discharge status: Home / Self Care | Payer: Medicaid Other | Attending: Medical Oncology | Admitting: Medical Oncology

## 2021-07-14 ENCOUNTER — Other Ambulatory Visit: Payer: Self-pay | Admitting: *Deleted

## 2021-07-14 VITALS — BP 128/83 | HR 88 | Temp 99.0°F | Resp 14 | Wt 103.0 lb

## 2021-07-14 DIAGNOSIS — G620 Drug-induced polyneuropathy: Secondary | ICD-10-CM

## 2021-07-14 DIAGNOSIS — C50911 Malignant neoplasm of unspecified site of right female breast: Secondary | ICD-10-CM

## 2021-07-14 DIAGNOSIS — E876 Hypokalemia: Secondary | ICD-10-CM | POA: Diagnosis not present

## 2021-07-14 DIAGNOSIS — Z171 Estrogen receptor negative status [ER-]: Secondary | ICD-10-CM | POA: Insufficient documentation

## 2021-07-14 DIAGNOSIS — R059 Cough, unspecified: Secondary | ICD-10-CM | POA: Insufficient documentation

## 2021-07-14 DIAGNOSIS — Z95828 Presence of other vascular implants and grafts: Secondary | ICD-10-CM

## 2021-07-14 DIAGNOSIS — D701 Agranulocytosis secondary to cancer chemotherapy: Secondary | ICD-10-CM | POA: Diagnosis present

## 2021-07-14 DIAGNOSIS — T451X5A Adverse effect of antineoplastic and immunosuppressive drugs, initial encounter: Secondary | ICD-10-CM | POA: Insufficient documentation

## 2021-07-14 DIAGNOSIS — C50411 Malignant neoplasm of upper-outer quadrant of right female breast: Secondary | ICD-10-CM | POA: Diagnosis not present

## 2021-07-14 DIAGNOSIS — Z5112 Encounter for antineoplastic immunotherapy: Secondary | ICD-10-CM | POA: Diagnosis present

## 2021-07-14 DIAGNOSIS — Z79899 Other long term (current) drug therapy: Secondary | ICD-10-CM | POA: Insufficient documentation

## 2021-07-14 DIAGNOSIS — R197 Diarrhea, unspecified: Secondary | ICD-10-CM

## 2021-07-14 DIAGNOSIS — R634 Abnormal weight loss: Secondary | ICD-10-CM

## 2021-07-14 DIAGNOSIS — Z5111 Encounter for antineoplastic chemotherapy: Secondary | ICD-10-CM | POA: Insufficient documentation

## 2021-07-14 DIAGNOSIS — C801 Malignant (primary) neoplasm, unspecified: Secondary | ICD-10-CM

## 2021-07-14 DIAGNOSIS — F411 Generalized anxiety disorder: Secondary | ICD-10-CM

## 2021-07-14 LAB — COMPREHENSIVE METABOLIC PANEL
ALT: 13 U/L (ref 0–44)
AST: 13 U/L — ABNORMAL LOW (ref 15–41)
Albumin: 4.1 g/dL (ref 3.5–5.0)
Alkaline Phosphatase: 46 U/L (ref 38–126)
Anion gap: 7 (ref 5–15)
BUN: 18 mg/dL (ref 8–23)
CO2: 23 mmol/L (ref 22–32)
Calcium: 9.1 mg/dL (ref 8.9–10.3)
Chloride: 99 mmol/L (ref 98–111)
Creatinine, Ser: 0.68 mg/dL (ref 0.44–1.00)
GFR, Estimated: >60 mL/min (ref >60)
Glucose, Bld: 95 mg/dL (ref 70–99)
Potassium: 4.3 mmol/L (ref 3.5–5.1)
Sodium: 129 mmol/L — ABNORMAL LOW (ref 135–145)
Total Bilirubin: 0.8 mg/dL (ref 0.3–1.2)
Total Protein: 6.9 g/dL (ref 6.5–8.1)

## 2021-07-14 LAB — CBC WITH DIFFERENTIAL/PLATELET
Abs Immature Granulocytes: 0 10*3/uL (ref 0.00–0.07)
Basophils Absolute: 0 10*3/uL (ref 0.0–0.1)
Basophils Relative: 0 %
Eosinophils Absolute: 0 10*3/uL (ref 0.0–0.5)
Eosinophils Relative: 1 %
HCT: 31.8 % — ABNORMAL LOW (ref 36.0–46.0)
Hemoglobin: 10.7 g/dL — ABNORMAL LOW (ref 12.0–15.0)
Immature Granulocytes: 0 %
Lymphocytes Relative: 93 %
Lymphs Abs: 1.4 10*3/uL (ref 0.7–4.0)
MCH: 35.4 pg — ABNORMAL HIGH (ref 26.0–34.0)
MCHC: 33.6 g/dL (ref 30.0–36.0)
MCV: 105.3 fL — ABNORMAL HIGH (ref 80.0–100.0)
Monocytes Absolute: 0 10*3/uL — ABNORMAL LOW (ref 0.1–1.0)
Monocytes Relative: 1 %
Neutro Abs: 0.1 10*3/uL — CL (ref 1.7–7.7)
Neutrophils Relative %: 5 %
Platelets: 133 10*3/uL — ABNORMAL LOW (ref 150–400)
RBC: 3.02 MIL/uL — ABNORMAL LOW (ref 3.87–5.11)
RDW: 15 % (ref 11.5–15.5)
Smear Review: NORMAL
WBC: 1.5 10*3/uL — ABNORMAL LOW (ref 4.0–10.5)
nRBC: 0 % (ref 0.0–0.2)

## 2021-07-14 MED ORDER — SODIUM CHLORIDE 0.9% FLUSH
10.0000 mL | Freq: Once | INTRAVENOUS | Status: AC
  Administered 2021-07-14: 10 mL via INTRAVENOUS
  Filled 2021-07-14: qty 10

## 2021-07-14 MED ORDER — HEPARIN SOD (PORK) LOCK FLUSH 100 UNIT/ML IV SOLN
500.0000 [IU] | Freq: Once | INTRAVENOUS | Status: AC
  Administered 2021-07-14: 500 [IU] via INTRAVENOUS
  Filled 2021-07-14: qty 5

## 2021-07-14 MED ORDER — LEVOFLOXACIN 750 MG PO TABS: 750.0000 mg | ORAL_TABLET | Freq: Every day | ORAL | 0 refills | Status: AC

## 2021-07-14 MED ORDER — SODIUM CHLORIDE 0.9 % IV SOLN
INTRAVENOUS | Status: AC
  Filled 2021-07-14: qty 250

## 2021-07-14 NOTE — Progress Notes (Signed)
Pt in for follow up, 1 week post chemotherapy treatment  Pt reports some constipation alternating with diarrhea. Two loose stools this morning, took imodium which has helped.   ?

## 2021-07-14 NOTE — Progress Notes (Signed)
?Hematology/Oncology Progress note ?[phone] [phone] ?  ? ?   ? ? ?Patient Care Team: ?[name], NP as PCP - General (Nurse Practitioner) ? ?REFERRING PROVIDER: ?[name], NP  ?CHIEF COMPLAINTS/REASON FOR VISIT:  ?Follow-up for triple negative right breast cancer. ? ?HISTORY OF PRESENTING ILLNESS:  ? ?Yvonne Scott is a  61 y.o.  female with PMH listed below was seen in consultation at the request of  [name], NP  for evaluation of breast cancer. ? ?August 2022, patient was diagnosed with right breast stage IIIb cT3 N0M0, grade 2, ER negative, PR negative HER2 negative breast cancer.  Patient self palpated breast mass many years ago.  10/21/2020 diagnostic mammogram and ultrasound confirmed presence of mass and a subsequent biopsy revealed right breast triple negative cancer. ?There was plan for patient to start with neoadjuvant chemotherapy followed by surgery and radiation.  Unfortunately patient never started treatment.  Patient was accompanied by her mother.  They want to transfer her oncology care to locally. ? ?Patient reports having right breast pain as well as generalized pain.  Patient had a [name] placed 3 weeks ago and she reports some swelling around the sites.  Patient has had negative genetic testing at [facility]. ? ?Patient has a history of alcohol dependence.  Per patient she has not been drinking recently.  Everyday smoker. ? ? ?02/24/2021, CT chest abdomen pelvis showed large right breast mass, 5.5 x 4.8 cm with small right axillary lymph node with variable degrees of enhancement potentially involved but not specific based on size.  This tracked down to retropectoral nodal stations largest lymph node along the lateral margin of the pectoralis major.  Tiny pulmonary nodules in the chest as discussed.  These are nonspecific but would consider short interval follow-up to assess for any changes.  No evidence of metastatic disease involving the  abdomen or pelvis.  Aortic atherosclerosis. ? ?02/25/2021, echocardiogram showed LVEF 65 to 70%.  Patient has been to chemotherapy class and understands antiemetics instructions. ? ?03/16/2021, bone scan showed no evidence of osseous metastatic breast cancer, asymmetric osseous uptake on the right at L5-S1, corresponding to degenerative disc disease on recent CT.  Asymmetric soft tissue uptake in the right breast. ? ?07/05/2021, right diagnostic mammogram and ultrasound showed interval decrease of the size of the right breast mass, currently 4.6 x 3.6 x 2.6, previously 5.1 x 4.4 x 3.5.  No suspicious right axillary lymphadenopathy on examination. ? ?INTERVAL HISTORY ?[name] is a 61 y.o. female who has above history reviewed by me today presents for follow up visit for management of triple negative breast cancer, stage IIIB ? ?Today she reports that she is feeling "ok". Feels a bit fatigued and volume depleted as her appetite has gone down. She has lost 2 pounds as she reports that foods do not taste well. Diarrhea which has plagued her had stopped for days until this morning when she had 2 episodes. Resolved with one dose of Imodium. She is taking her potassium and magnesium supplements. She also has noticed mild SOB and cough. She was seen by cardiology yesterday and mentioned this according to patient but was told that she was doing well and likely anxious. No fevers, chest pain, hemoptysis. She has not tried anything for symptoms.  ? ? ? ?Review of Systems  ?Constitutional:  Positive for fatigue. Negative for appetite change, chills and fever.  ?HENT:   Negative for hearing loss, mouth sores and voice change.   ?  Eyes:  Negative for eye problems.  ?Respiratory:  Positive for cough and shortness of breath (mild). Negative for chest tightness.   ?Cardiovascular:  Negative for chest pain.  ?Gastrointestinal:  Negative for abdominal distention, abdominal pain, blood in stool, diarrhea and nausea.  ?Endocrine:  Negative for hot flashes.  ?Genitourinary:  Negative for difficulty urinating, dysuria and frequency.   ?Musculoskeletal:  Negative for arthralgias.  ?Skin:  Negative for itching and rash.  ?Neurological:  Positive for numbness. Negative for extremity weakness.  ?Hematological:  Negative for adenopathy.  ?Psychiatric/Behavioral:  Negative for confusion. The patient is not nervous/anxious.   ? ?MEDICAL HISTORY:  ?Past Medical History:  ?Diagnosis Date  ? Anxiety   ? Aortic atherosclerosis ([place]) 10/24/2016  ? Breast cancer ([place])   ? GERD (gastroesophageal reflux disease)   ? Personal history of chemotherapy   ? ? ?SURGICAL HISTORY: ?Past Surgical History:  ?Procedure Laterality Date  ? BREAST BIOPSY    ? ? ?SOCIAL HISTORY: ?Social History  ? ?Socioeconomic History  ? Marital status: Widowed  ?  Spouse name: Not on file  ? Number of children: Not on file  ? Years of education: Not on file  ? Highest education level: Not on file  ?Occupational History  ? Not on file  ?Tobacco Use  ? Smoking status: Every Day  ?  Packs/day: 1.00  ?  Years: 36.00  ?  Pack years: 36.00  ?  Types: Cigarettes  ?  Start date: 03/15/1979  ? Smokeless tobacco: Never  ?Vaping Use  ? Vaping Use: Never used  ?Substance and Sexual Activity  ? Alcohol use: No  ?  Alcohol/week: 3.0 standard drinks  ?  Types: 3 Standard drinks or equivalent per week  ? Drug use: Not Currently  ? Sexual activity: Not Currently  ?  Birth control/protection: None  ?Other Topics Concern  ? Not on file  ?Social History Narrative  ? ** Merged History Encounter **  ?    ? ?Social Determinants of Health  ? ?Financial Resource Strain: Not on file  ?Food Insecurity: Not on file  ?Transportation Needs: Not on file  ?Physical Activity: Not on file  ?Stress: Not on file  ?Social Connections: Not on file  ?Intimate Partner Violence: Not on file  ? ? ?FAMILY HISTORY: ?Family History  ?Problem Relation Age of Onset  ? Cancer Mother 74  ?     breast  ? Cancer Father   ?     melonma  ?  Cirrhosis Maternal Grandfather   ? Cancer Paternal Grandmother   ?     skin/breast  ? ? ?ALLERGIES:  has No Known Allergies. ? ?MEDICATIONS:  ?Current Outpatient Medications  ?Medication Sig Dispense Refill  ? calcium-vitamin D (OSCAL WITH D) 500-5 MG-MCG tablet Take 2 tablets by mouth daily. 60 tablet 5  ? dexamethasone (DECADRON) 4 MG tablet TAKE 2 TABLETS BY MOUTH ONCE DAILY FOR 2 DAYS AFTER  CHEMOTHERAPY.  TAKE  WITH  FOOD. 15 tablet 0  ? diphenoxylate-atropine (LOMOTIL) 2.5-0.025 MG tablet Take 1 tablet by mouth 4 (four) times daily as needed for diarrhea or loose stools. 30 tablet 0  ? folic acid (FOLVITE) 1 MG tablet Take 1 tablet (1 mg total) by mouth daily. 30 tablet 0  ? gabapentin (NEURONTIN) 100 MG capsule Take 1 capsule (100 mg total) by mouth 3 (three) times daily. 90 capsule 0  ? guaiFENesin (MUCINEX) 600 MG 12 hr tablet Take 2 tablets (1,200 mg total) by mouth 2 (  two) times daily. 60 tablet 0  ? ibuprofen (ADVIL) 200 MG tablet Take 200 mg by mouth every 6 (six) hours as needed for headache.    ? lidocaine-prilocaine (EMLA) cream Apply to affected area once 30 g 3  ? loperamide (IMODIUM) 2 MG capsule Take 1 capsule (2 mg total) by mouth See admin instructions. Initial: 4 mg, followed by 2 mg after each loose stool; maximum: 16 mg/day 90 capsule 1  ? LORazepam (ATIVAN) 0.5 MG tablet Take 1 tablet (0.5 mg total) by mouth every 12 (twelve) hours as needed for anxiety (nausea). 15 tablet 0  ? magnesium chloride (SLOW-MAG) 64 MG TBEC SR tablet Take 1 tablet (64 mg total) by mouth 2 (two) times daily. 60 tablet 2  ? Multiple Vitamin (MULTIVITAMIN WITH MINERALS) TABS tablet Take 1 tablet by mouth daily. 30 tablet 0  ? omeprazole (PRILOSEC) 20 MG capsule Take 1 capsule (20 mg total) by mouth 2 (two) times daily before a meal. 60 capsule 1  ? ondansetron (ZOFRAN) 8 MG tablet TAKE 1 TABLET BY MOUTH TWICE DAILY AS NEEDED. START THE THIRD DAY AFTER CARBOPLATIN AND AC CHEMOTHERAPY. 30 tablet 0  ? potassium  chloride SA (KLOR-CON M) 20 MEQ tablet Take 1 tablet (20 mEq total) by mouth 2 (two) times daily. 60 tablet 1  ? prochlorperazine (COMPAZINE) 10 MG tablet Take 1 tablet (10 mg total) by mouth every 6 (six) hours as

## 2021-07-16 ENCOUNTER — Other Ambulatory Visit: Payer: Self-pay

## 2021-07-16 ENCOUNTER — Emergency Department: Payer: Medicaid Other

## 2021-07-16 ENCOUNTER — Inpatient Hospital Stay: Payer: Medicaid Other

## 2021-07-16 ENCOUNTER — Inpatient Hospital Stay (HOSPITAL_BASED_OUTPATIENT_CLINIC_OR_DEPARTMENT_OTHER): Payer: Medicaid Other | Admitting: Medical Oncology

## 2021-07-16 ENCOUNTER — Inpatient Hospital Stay
Admission: EM | Admit: 2021-07-16 | Discharge: 2021-07-19 | DRG: 871 | Disposition: A | Discharge status: Home / Self Care | Payer: Medicaid Other | Attending: Internal Medicine | Admitting: Internal Medicine

## 2021-07-16 ENCOUNTER — Telehealth: Payer: Self-pay | Admitting: *Deleted

## 2021-07-16 VITALS — BP 89/59 | HR 108 | Temp 98.4°F | Resp 20 | Wt 95.3 lb

## 2021-07-16 DIAGNOSIS — C50911 Malignant neoplasm of unspecified site of right female breast: Secondary | ICD-10-CM

## 2021-07-16 DIAGNOSIS — Z720 Tobacco use: Secondary | ICD-10-CM | POA: Diagnosis present

## 2021-07-16 DIAGNOSIS — R5081 Fever presenting with conditions classified elsewhere: Secondary | ICD-10-CM | POA: Diagnosis present

## 2021-07-16 DIAGNOSIS — E119 Type 2 diabetes mellitus without complications: Secondary | ICD-10-CM | POA: Diagnosis present

## 2021-07-16 DIAGNOSIS — F419 Anxiety disorder, unspecified: Secondary | ICD-10-CM | POA: Diagnosis present

## 2021-07-16 DIAGNOSIS — Y95 Nosocomial condition: Secondary | ICD-10-CM | POA: Diagnosis present

## 2021-07-16 DIAGNOSIS — D61810 Antineoplastic chemotherapy induced pancytopenia: Secondary | ICD-10-CM | POA: Diagnosis present

## 2021-07-16 DIAGNOSIS — K219 Gastro-esophageal reflux disease without esophagitis: Secondary | ICD-10-CM | POA: Diagnosis present

## 2021-07-16 DIAGNOSIS — R059 Cough, unspecified: Secondary | ICD-10-CM

## 2021-07-16 DIAGNOSIS — Z79899 Other long term (current) drug therapy: Secondary | ICD-10-CM | POA: Diagnosis not present

## 2021-07-16 DIAGNOSIS — A419 Sepsis, unspecified organism: Secondary | ICD-10-CM | POA: Diagnosis present

## 2021-07-16 DIAGNOSIS — T451X5A Adverse effect of antineoplastic and immunosuppressive drugs, initial encounter: Secondary | ICD-10-CM

## 2021-07-16 DIAGNOSIS — Z803 Family history of malignant neoplasm of breast: Secondary | ICD-10-CM | POA: Diagnosis not present

## 2021-07-16 DIAGNOSIS — J189 Pneumonia, unspecified organism: Secondary | ICD-10-CM | POA: Diagnosis present

## 2021-07-16 DIAGNOSIS — C50919 Malignant neoplasm of unspecified site of unspecified female breast: Secondary | ICD-10-CM | POA: Diagnosis present

## 2021-07-16 DIAGNOSIS — R634 Abnormal weight loss: Secondary | ICD-10-CM | POA: Diagnosis not present

## 2021-07-16 DIAGNOSIS — F17210 Nicotine dependence, cigarettes, uncomplicated: Secondary | ICD-10-CM | POA: Diagnosis present

## 2021-07-16 DIAGNOSIS — Z5112 Encounter for antineoplastic immunotherapy: Secondary | ICD-10-CM | POA: Diagnosis not present

## 2021-07-16 DIAGNOSIS — I700 Atherosclerosis of aorta: Secondary | ICD-10-CM | POA: Diagnosis present

## 2021-07-16 DIAGNOSIS — D709 Neutropenia, unspecified: Secondary | ICD-10-CM | POA: Diagnosis present

## 2021-07-16 DIAGNOSIS — E876 Hypokalemia: Secondary | ICD-10-CM | POA: Diagnosis present

## 2021-07-16 DIAGNOSIS — Z95828 Presence of other vascular implants and grafts: Secondary | ICD-10-CM

## 2021-07-16 DIAGNOSIS — Z20822 Contact with and (suspected) exposure to covid-19: Secondary | ICD-10-CM | POA: Diagnosis present

## 2021-07-16 DIAGNOSIS — D61818 Other pancytopenia: Secondary | ICD-10-CM | POA: Diagnosis not present

## 2021-07-16 DIAGNOSIS — I10 Essential (primary) hypertension: Secondary | ICD-10-CM | POA: Diagnosis present

## 2021-07-16 DIAGNOSIS — D701 Agranulocytosis secondary to cancer chemotherapy: Secondary | ICD-10-CM

## 2021-07-16 DIAGNOSIS — Z171 Estrogen receptor negative status [ER-]: Secondary | ICD-10-CM | POA: Diagnosis not present

## 2021-07-16 DIAGNOSIS — R197 Diarrhea, unspecified: Secondary | ICD-10-CM

## 2021-07-16 LAB — COMPREHENSIVE METABOLIC PANEL
ALT: 10 U/L (ref 0–44)
AST: 10 U/L — ABNORMAL LOW (ref 15–41)
Albumin: 2.9 g/dL — ABNORMAL LOW (ref 3.5–5.0)
Alkaline Phosphatase: 40 U/L (ref 38–126)
Anion gap: 10 (ref 5–15)
BUN: 15 mg/dL (ref 8–23)
CO2: 19 mmol/L — ABNORMAL LOW (ref 22–32)
Calcium: 8.2 mg/dL — ABNORMAL LOW (ref 8.9–10.3)
Chloride: 105 mmol/L (ref 98–111)
Creatinine, Ser: 0.46 mg/dL (ref 0.44–1.00)
GFR, Estimated: >60 mL/min (ref >60)
Glucose, Bld: 74 mg/dL (ref 70–99)
Potassium: 3.2 mmol/L — ABNORMAL LOW (ref 3.5–5.1)
Sodium: 134 mmol/L — ABNORMAL LOW (ref 135–145)
Total Bilirubin: 0.6 mg/dL (ref 0.3–1.2)
Total Protein: 5.5 g/dL — ABNORMAL LOW (ref 6.5–8.1)

## 2021-07-16 LAB — CBC WITH DIFFERENTIAL/PLATELET
Abs Immature Granulocytes: 0.01 10*3/uL (ref 0.00–0.07)
Abs Immature Granulocytes: 0 10*3/uL (ref 0.00–0.07)
Basophils Absolute: 0 10*3/uL (ref 0.0–0.1)
Basophils Absolute: 0 10*3/uL (ref 0.0–0.1)
Basophils Relative: 2 %
Basophils Relative: 1 %
Eosinophils Absolute: 0 10*3/uL (ref 0.0–0.5)
Eosinophils Absolute: 0 10*3/uL (ref 0.0–0.5)
Eosinophils Relative: 1 %
Eosinophils Relative: 0 %
HCT: 32.6 % — ABNORMAL LOW (ref 36.0–46.0)
HCT: 28.4 % — ABNORMAL LOW (ref 36.0–46.0)
Hemoglobin: 11.1 g/dL — ABNORMAL LOW (ref 12.0–15.0)
Hemoglobin: 9.3 g/dL — ABNORMAL LOW (ref 12.0–15.0)
Immature Granulocytes: 1 %
Immature Granulocytes: 0 %
Lymphocytes Relative: 58 %
Lymphocytes Relative: 54 %
Lymphs Abs: 0.8 10*3/uL (ref 0.7–4.0)
Lymphs Abs: 0.5 10*3/uL — ABNORMAL LOW (ref 0.7–4.0)
MCH: 34.9 pg — ABNORMAL HIGH (ref 26.0–34.0)
MCH: 34.2 pg — ABNORMAL HIGH (ref 26.0–34.0)
MCHC: 34 g/dL (ref 30.0–36.0)
MCHC: 32.7 g/dL (ref 30.0–36.0)
MCV: 102.5 fL — ABNORMAL HIGH (ref 80.0–100.0)
MCV: 104.4 fL — ABNORMAL HIGH (ref 80.0–100.0)
Monocytes Absolute: 0.1 10*3/uL (ref 0.1–1.0)
Monocytes Absolute: 0.1 10*3/uL (ref 0.1–1.0)
Monocytes Relative: 9 %
Monocytes Relative: 10 %
Neutro Abs: 0.4 10*3/uL — CL (ref 1.7–7.7)
Neutro Abs: 0.4 10*3/uL — CL (ref 1.7–7.7)
Neutrophils Relative %: 29 %
Neutrophils Relative %: 35 %
Platelets: 69 10*3/uL — ABNORMAL LOW (ref 150–400)
Platelets: 55 10*3/uL — ABNORMAL LOW (ref 150–400)
RBC: 3.18 MIL/uL — ABNORMAL LOW (ref 3.87–5.11)
RBC: 2.72 MIL/uL — ABNORMAL LOW (ref 3.87–5.11)
RDW: 14.6 % (ref 11.5–15.5)
RDW: 14.8 % (ref 11.5–15.5)
Smear Review: NORMAL
Smear Review: DECREASED
WBC: 1.3 10*3/uL — CL (ref 4.0–10.5)
WBC: 1 10*3/uL — CL (ref 4.0–10.5)
nRBC: 0 % (ref 0.0–0.2)
nRBC: 0 % (ref 0.0–0.2)

## 2021-07-16 LAB — BASIC METABOLIC PANEL
Anion gap: 12 (ref 5–15)
BUN: 17 mg/dL (ref 8–23)
CO2: 20 mmol/L — ABNORMAL LOW (ref 22–32)
Calcium: 8.8 mg/dL — ABNORMAL LOW (ref 8.9–10.3)
Chloride: 97 mmol/L — ABNORMAL LOW (ref 98–111)
Creatinine, Ser: 0.57 mg/dL (ref 0.44–1.00)
GFR, Estimated: >60 mL/min (ref >60)
Glucose, Bld: 93 mg/dL (ref 70–99)
Potassium: 3.2 mmol/L — ABNORMAL LOW (ref 3.5–5.1)
Sodium: 129 mmol/L — ABNORMAL LOW (ref 135–145)

## 2021-07-16 LAB — LACTIC ACID, PLASMA: Lactic Acid, Venous: 0.9 mmol/L (ref 0.5–1.9)

## 2021-07-16 LAB — URINALYSIS, ROUTINE W REFLEX MICROSCOPIC
Bilirubin Urine: NEGATIVE
Glucose, UA: NEGATIVE mg/dL
Hgb urine dipstick: NEGATIVE
Ketones, ur: 5 mg/dL — AB
Leukocytes,Ua: NEGATIVE
Nitrite: NEGATIVE
Protein, ur: NEGATIVE mg/dL
Specific Gravity, Urine: 1.039 — ABNORMAL HIGH (ref 1.005–1.030)
pH: 6 (ref 5.0–8.0)

## 2021-07-16 LAB — RESP PANEL BY RT-PCR (FLU A&B, COVID) ARPGX2
Influenza A by PCR: NEGATIVE
Influenza B by PCR: NEGATIVE
SARS Coronavirus 2 by RT PCR: NEGATIVE

## 2021-07-16 LAB — TROPONIN I (HIGH SENSITIVITY): Troponin I (High Sensitivity): 4 ng/L (ref <18)

## 2021-07-16 LAB — MAGNESIUM: Magnesium: 1.4 mg/dL — ABNORMAL LOW (ref 1.7–2.4)

## 2021-07-16 LAB — PATHOLOGIST SMEAR REVIEW

## 2021-07-16 LAB — D-DIMER, QUANTITATIVE: D-Dimer, Quant: 0.63 ug/mL-FEU — ABNORMAL HIGH (ref 0.00–0.50)

## 2021-07-16 LAB — STREP PNEUMONIAE URINARY ANTIGEN: Strep Pneumo Urinary Antigen: NEGATIVE

## 2021-07-16 MED ORDER — HEPARIN SOD (PORK) LOCK FLUSH 100 UNIT/ML IV SOLN
500.0000 [IU] | Freq: Once | INTRAVENOUS | Status: DC
  Filled 2021-07-16: qty 5

## 2021-07-16 MED ORDER — ONDANSETRON HCL 4 MG/2ML IJ SOLN
4.0000 mg | Freq: Three times a day (TID) | INTRAMUSCULAR | Status: DC | PRN
Start: 2021-07-16 — End: 2021-07-19
  Administered 2021-07-18: 4 mg via INTRAVENOUS
  Filled 2021-07-16: qty 2

## 2021-07-16 MED ORDER — LACTATED RINGERS IV BOLUS
1000.0000 mL | Freq: Once | INTRAVENOUS | Status: AC
  Administered 2021-07-16: 1000 mL via INTRAVENOUS

## 2021-07-16 MED ORDER — ADULT MULTIVITAMIN W/MINERALS CH
1.0000 | ORAL_TABLET | Freq: Every day | ORAL | Status: DC
  Administered 2021-07-16 – 2021-07-19 (×4): 1 via ORAL
  Filled 2021-07-16 (×4): qty 1

## 2021-07-16 MED ORDER — PANTOPRAZOLE SODIUM 40 MG PO TBEC
40.0000 mg | DELAYED_RELEASE_TABLET | Freq: Every day | ORAL | Status: DC
  Administered 2021-07-16 – 2021-07-19 (×4): 40 mg via ORAL
  Filled 2021-07-16 (×4): qty 1

## 2021-07-16 MED ORDER — FOLIC ACID 1 MG PO TABS
1.0000 mg | ORAL_TABLET | Freq: Every day | ORAL | Status: DC
Start: 2021-07-16 — End: 2021-07-19
  Administered 2021-07-16 – 2021-07-19 (×4): 1 mg via ORAL
  Filled 2021-07-16 (×4): qty 1

## 2021-07-16 MED ORDER — IOHEXOL 350 MG/ML SOLN
60.0000 mL | Freq: Once | INTRAVENOUS | Status: AC | PRN
  Administered 2021-07-16: 60 mL via INTRAVENOUS

## 2021-07-16 MED ORDER — VANCOMYCIN HCL 750 MG/150ML IV SOLN
750.0000 mg | INTRAVENOUS | Status: DC
  Administered 2021-07-17: 750 mg via INTRAVENOUS
  Filled 2021-07-16 (×2): qty 150

## 2021-07-16 MED ORDER — SODIUM CHLORIDE 0.9 % IV BOLUS
1000.0000 mL | Freq: Once | INTRAVENOUS | Status: AC
  Administered 2021-07-16: 1000 mL via INTRAVENOUS

## 2021-07-16 MED ORDER — LORAZEPAM 0.5 MG PO TABS
0.5000 mg | ORAL_TABLET | Freq: Two times a day (BID) | ORAL | Status: DC | PRN
  Administered 2021-07-17 – 2021-07-19 (×2): 0.5 mg via ORAL
  Filled 2021-07-16 (×2): qty 1

## 2021-07-16 MED ORDER — DIPHENOXYLATE-ATROPINE 2.5-0.025 MG PO TABS: 1.0000 | ORAL_TABLET | Freq: Four times a day (QID) | ORAL | Status: DC | PRN

## 2021-07-16 MED ORDER — GABAPENTIN 100 MG PO CAPS
100.0000 mg | ORAL_CAPSULE | Freq: Three times a day (TID) | ORAL | Status: DC
  Administered 2021-07-16 – 2021-07-19 (×9): 100 mg via ORAL
  Filled 2021-07-16 (×9): qty 1

## 2021-07-16 MED ORDER — SODIUM CHLORIDE 0.9 % IV SOLN
INTRAVENOUS | Status: AC
  Filled 2021-07-16: qty 250

## 2021-07-16 MED ORDER — POTASSIUM CHLORIDE CRYS ER 20 MEQ PO TBCR
40.0000 meq | EXTENDED_RELEASE_TABLET | Freq: Once | ORAL | Status: AC
  Administered 2021-07-16: 40 meq via ORAL
  Filled 2021-07-16: qty 2

## 2021-07-16 MED ORDER — SODIUM CHLORIDE 0.9 % IV SOLN
2.0000 g | Freq: Once | INTRAVENOUS | Status: AC
  Administered 2021-07-16: 2 g via INTRAVENOUS
  Filled 2021-07-16: qty 12.5

## 2021-07-16 MED ORDER — ACETAMINOPHEN 325 MG PO TABS: 650.0000 mg | ORAL_TABLET | Freq: Four times a day (QID) | ORAL | Status: DC | PRN

## 2021-07-16 MED ORDER — ALBUTEROL SULFATE (2.5 MG/3ML) 0.083% IN NEBU: 3.0000 mL | INHALATION_SOLUTION | RESPIRATORY_TRACT | Status: DC | PRN

## 2021-07-16 MED ORDER — OYSTER SHELL CALCIUM/D3 500-5 MG-MCG PO TABS
2.0000 | ORAL_TABLET | Freq: Every day | ORAL | Status: DC
  Administered 2021-07-16 – 2021-07-19 (×4): 2 via ORAL
  Filled 2021-07-16 (×4): qty 2

## 2021-07-16 MED ORDER — NICOTINE 21 MG/24HR TD PT24
21.0000 mg | MEDICATED_PATCH | Freq: Every day | TRANSDERMAL | Status: DC
  Administered 2021-07-16 – 2021-07-19 (×4): 21 mg via TRANSDERMAL
  Filled 2021-07-16 (×4): qty 1

## 2021-07-16 MED ORDER — MAGNESIUM CHLORIDE 64 MG PO TBEC
1.0000 | DELAYED_RELEASE_TABLET | Freq: Two times a day (BID) | ORAL | Status: DC
  Administered 2021-07-17 – 2021-07-19 (×5): 64 mg via ORAL
  Filled 2021-07-16 (×7): qty 1

## 2021-07-16 MED ORDER — DM-GUAIFENESIN ER 30-600 MG PO TB12
1.0000 | ORAL_TABLET | Freq: Two times a day (BID) | ORAL | Status: DC | PRN
Start: 2021-07-16 — End: 2021-07-19

## 2021-07-16 MED ORDER — ONDANSETRON HCL 4 MG/2ML IJ SOLN
4.0000 mg | Freq: Once | INTRAMUSCULAR | Status: AC
  Administered 2021-07-16: 4 mg via INTRAVENOUS
  Filled 2021-07-16: qty 2

## 2021-07-16 MED ORDER — THIAMINE HCL 100 MG PO TABS
50.0000 mg | ORAL_TABLET | Freq: Every day | ORAL | Status: DC
  Administered 2021-07-16 – 2021-07-19 (×4): 50 mg via ORAL
  Filled 2021-07-16 (×4): qty 1

## 2021-07-16 MED ORDER — SODIUM CHLORIDE 0.9% FLUSH
10.0000 mL | Freq: Once | INTRAVENOUS | Status: AC
  Administered 2021-07-16: 10 mL via INTRAVENOUS
  Filled 2021-07-16: qty 10

## 2021-07-16 MED ORDER — SODIUM CHLORIDE 0.9 % IV SOLN
2.0000 g | Freq: Two times a day (BID) | INTRAVENOUS | Status: DC
  Administered 2021-07-17 – 2021-07-19 (×5): 2 g via INTRAVENOUS
  Filled 2021-07-16 (×2): qty 12.5
  Filled 2021-07-16 (×2): qty 2
  Filled 2021-07-16 (×2): qty 12.5

## 2021-07-16 MED ORDER — VANCOMYCIN HCL IN DEXTROSE 1-5 GM/200ML-% IV SOLN
1000.0000 mg | Freq: Once | INTRAVENOUS | Status: AC
  Administered 2021-07-16: 1000 mg via INTRAVENOUS
  Filled 2021-07-16: qty 200

## 2021-07-16 MED ORDER — OXYCODONE-ACETAMINOPHEN 5-325 MG PO TABS
1.0000 | ORAL_TABLET | ORAL | Status: DC | PRN
  Administered 2021-07-17 – 2021-07-18 (×6): 1 via ORAL
  Filled 2021-07-16 (×6): qty 1

## 2021-07-16 MED ORDER — SODIUM CHLORIDE 0.9 % IV SOLN: INTRAVENOUS | Status: DC

## 2021-07-16 NOTE — Progress Notes (Signed)
? ?[facility] ?[place] [facility] ?Telephone:(336) [phone] Fax:(336) [phone] ? ?Patient Care Team: ?Yvonne Berry, NP as PCP - General (Nurse Practitioner)  ? ?Name of the patient: Yvonne Scott  ?[id number]  ?August 26, 1960  ? ?Date of visit: 07/16/21 ? ?Reason for Consult: ?Yvonne Scott is a 61 y.o. female who presents today for: ? ?Cough: Pt was seen 2 days ago in clinic for normal follow up however she reported a cough, fatigue and was found to have neutropenia. She was given IVF and started on Levaquin for suspected CAP. Subsequent chest x ray negative for acute process. No edema or weight gain suggesting CFH. She presents today for follow up. She reports that she is feeling weak. Stays most of the day in bed now. Coughing up non-bloody mucous. Hasn't eaten and barely drank- has lost 10 pounds in 2 days. No vomiting, diarrhea, chest pain or SOB. Taking and tolerating her Levaquin well and has not missed a dose.  ? ?Denies any neurologic complaints. Denies recent fevers or illnesses. Denies any easy bleeding or bruising. Reports good appetite and denies weight loss. Denies chest pain. Denies any nausea, vomiting, constipation, or diarrhea. Denies urinary complaints. Patient offers no further specific complaints today. ? ? ? ?PAST MEDICAL HISTORY: ?Past Medical History:  ?Diagnosis Date  ? Anxiety   ? Aortic atherosclerosis ([place]) 10/24/2016  ? Breast cancer ([place])   ? GERD (gastroesophageal reflux disease)   ? Personal history of chemotherapy   ? ? ?PAST SURGICAL HISTORY:  ?Past Surgical History:  ?Procedure Laterality Date  ? BREAST BIOPSY    ? ? ?HEMATOLOGY/ONCOLOGY HISTORY:  ?Oncology History  ?Invasive ductal carcinoma of right breast ([place])  ?02/19/2021 Cancer Staging  ? Staging form: Breast, AJCC 8th Edition ?- Clinical stage from 02/19/2021: Stage IIIB (cT3, cN0, cM0, G2, ER-, PR-, HER2-) - Signed by Earlie Server, MD on 02/20/2021 ?Stage prefix: Initial  diagnosis ?Histologic grading system: 3 grade system ? ?  ?02/20/2021 Initial Diagnosis  ? Invasive ductal carcinoma of right breast ([place]) ? ?  ?03/11/2021 -  Chemotherapy  ? Patient is on Treatment Plan : BREAST Pembrolizumab (200) D1 + Carboplatin (5) D1 + Paclitaxel (80) D1,8,15 q21d X 4 cycles / Pembrolizumab (200) D1 + AC D1 q21d x 4 cycles  ? ?  ?  ? ? ?ALLERGIES:  has No Known Allergies. ? ?MEDICATIONS:  ?Current Outpatient Medications  ?Medication Sig Dispense Refill  ? calcium-vitamin D (OSCAL WITH D) 500-5 MG-MCG tablet Take 2 tablets by mouth daily. 60 tablet 5  ? dexamethasone (DECADRON) 4 MG tablet TAKE 2 TABLETS BY MOUTH ONCE DAILY FOR 2 DAYS AFTER  CHEMOTHERAPY.  TAKE  WITH  FOOD. 15 tablet 0  ? diphenoxylate-atropine (LOMOTIL) 2.5-0.025 MG tablet Take 1 tablet by mouth 4 (four) times daily as needed for diarrhea or loose stools. 30 tablet 0  ? fluconazole (DIFLUCAN) 150 MG tablet Take 1 tablet (150 mg total) by mouth See admin instructions. Take one tablet, and repeat the second dose in 3 days. (Patient not taking: Reported on 06/01/2021) 2 tablet 0  ? folic acid (FOLVITE) 1 MG tablet Take 1 tablet (1 mg total) by mouth daily. 30 tablet 0  ? gabapentin (NEURONTIN) 100 MG capsule Take 1 capsule (100 mg total) by mouth 3 (three) times daily. 90 capsule 0  ? guaiFENesin (MUCINEX) 600 MG 12 hr tablet Take 2 tablets (1,200 mg total) by mouth 2 (two) times daily. 60 tablet 0  ?  ibuprofen (ADVIL) 200 MG tablet Take 200 mg by mouth every 6 (six) hours as needed for headache.    ? levofloxacin (LEVAQUIN) 750 MG tablet Take 1 tablet (750 mg total) by mouth daily for 7 days. 7 tablet 0  ? lidocaine-prilocaine (EMLA) cream Apply to affected area once 30 g 3  ? loperamide (IMODIUM) 2 MG capsule Take 1 capsule (2 mg total) by mouth See admin instructions. Initial: 4 mg, followed by 2 mg after each loose stool; maximum: 16 mg/day 90 capsule 1  ? LORazepam (ATIVAN) 0.5 MG tablet Take 1 tablet (0.5 mg total) by mouth  every 12 (twelve) hours as needed for anxiety (nausea). 15 tablet 0  ? magic mouthwash (multi-ingredient) oral suspension Take 5 mLs by mouth 4 (four) times daily as needed for mouth pain. (Patient not taking: Reported on 07/14/2021) 480 mL 1  ? magnesium chloride (SLOW-MAG) 64 MG TBEC SR tablet Take 1 tablet (64 mg total) by mouth 2 (two) times daily. 60 tablet 2  ? Multiple Vitamin (MULTIVITAMIN WITH MINERALS) TABS tablet Take 1 tablet by mouth daily. 30 tablet 0  ? omeprazole (PRILOSEC) 20 MG capsule Take 1 capsule (20 mg total) by mouth 2 (two) times daily before a meal. 60 capsule 1  ? ondansetron (ZOFRAN) 8 MG tablet TAKE 1 TABLET BY MOUTH TWICE DAILY AS NEEDED. START THE THIRD DAY AFTER CARBOPLATIN AND AC CHEMOTHERAPY. 30 tablet 0  ? potassium chloride SA (KLOR-CON M) 20 MEQ tablet Take 1 tablet (20 mEq total) by mouth 2 (two) times daily. 60 tablet 1  ? prochlorperazine (COMPAZINE) 10 MG tablet Take 1 tablet (10 mg total) by mouth every 6 (six) hours as needed (Nausea or vomiting). 30 tablet 1  ? thiamine 100 MG tablet Take 0.5 tablets (50 mg total) by mouth daily. 15 tablet 0  ? VENTOLIN HFA 108 (90 Base) MCG/ACT inhaler Inhale 2 puffs into the lungs 4 (four) times daily as needed.    ? ?No current facility-administered medications for this visit.  ? ? ?VITAL SIGNS: ?BP (!) 89/59   Pulse (!) 108   Temp 98.4 ?F (36.9 ?C) (Oral)   Resp 20   Wt 95 lb 5 oz (43.2 kg)   LMP 11/19/2002 (Approximate)   SpO2 97%   BMI 17.72 kg/m?  ?Filed Weights  ? 07/16/21 1010  ?Weight: 95 lb 5 oz (43.2 kg)  ?  ?Estimated body mass index is 17.72 kg/m? as calculated from the following: ?  Height as of 05/25/21: 5' 1.5" (1.562 m). ?  Weight as of this encounter: 95 lb 5 oz (43.2 kg). ? ?LABS: ?CBC: ?   ?Component Value Date/Time  ? WBC 1.3 (LL) 07/16/2021 1001  ? HGB 11.1 (L) 07/16/2021 1001  ? HGB 12.7 02/17/2014 0323  ? HCT 32.6 (L) 07/16/2021 1001  ? HCT 37.7 02/17/2014 0323  ? PLT 69 (L) 07/16/2021 1001  ? PLT 156  02/17/2014 0323  ? MCV 102.5 (H) 07/16/2021 1001  ? MCV 100 02/17/2014 0323  ? NEUTROABS 0.4 (LL) 07/16/2021 1001  ? NEUTROABS 5.9 02/17/2014 0323  ? LYMPHSABS 0.8 07/16/2021 1001  ? LYMPHSABS 3.2 02/17/2014 0323  ? MONOABS 0.1 07/16/2021 1001  ? MONOABS 0.4 02/17/2014 0323  ? EOSABS 0.0 07/16/2021 1001  ? EOSABS 0.0 02/17/2014 0323  ? BASOSABS 0.0 07/16/2021 1001  ? BASOSABS 0.1 02/17/2014 0323  ? ?Comprehensive Metabolic Panel: ?   ?Component Value Date/Time  ? NA 129 (L) 07/16/2021 1001  ? NA 134 (L)  02/17/2014 0323  ? K 3.2 (L) 07/16/2021 1001  ? K 3.3 (L) 02/17/2014 1113  ? CL 97 (L) 07/16/2021 1001  ? CL 100 02/17/2014 0323  ? CO2 20 (L) 07/16/2021 1001  ? CO2 26 02/17/2014 0323  ? BUN 17 07/16/2021 1001  ? BUN 6 (L) 02/17/2014 0323  ? CREATININE 0.57 07/16/2021 1001  ? CREATININE 0.77 01/27/2017 1418  ? GLUCOSE 93 07/16/2021 1001  ? GLUCOSE 108 (H) 02/17/2014 0323  ? CALCIUM 8.8 (L) 07/16/2021 1001  ? CALCIUM 7.9 (L) 02/17/2014 0323  ? AST 13 (L) 07/14/2021 1053  ? AST 41 (H) 02/17/2014 0323  ? ALT 13 07/14/2021 1053  ? ALT 36 02/17/2014 0323  ? ALKPHOS 46 07/14/2021 1053  ? ALKPHOS 47 02/17/2014 0323  ? BILITOT 0.8 07/14/2021 1053  ? BILITOT 0.8 02/17/2014 0323  ? PROT 6.9 07/14/2021 1053  ? PROT 5.9 (L) 02/17/2014 0323  ? ALBUMIN 4.1 07/14/2021 1053  ? ALBUMIN 3.0 (L) 02/17/2014 0323  ? ? ?RADIOGRAPHIC STUDIES: ?DG Chest 2 View ? ?Result Date: 07/14/2021 ?CLINICAL DATA:  Chest congestion history of breast carcinoma EXAM: CHEST - 2 VIEW COMPARISON:  04/29/2021 FINDINGS: Cardiac size is within normal limits. Increase in AP diameter of chest suggests COPD. Lung fields are clear of any infiltrates or pulmonary edema. There is no pleural effusion or pneumothorax. Tip of left IJ chest port is seen in the superior vena cava close to the right atrium. IMPRESSION: No active cardiopulmonary disease. Electronically Signed   By: Elmer Picker M.D.   On: 07/14/2021 14:43  ? ?US BREAST LTD UNI RIGHT INC  AXILLA ? ?Result Date: 07/05/2021 ?CLINICAL DATA:  61 year old female with known right breast cancer. EXAM: DIGITAL DIAGNOSTIC UNILATERAL RIGHT MAMMOGRAM WITH TOMOSYNTHESIS AND CAD; ULTRASOUND RIGHT BREAST LIMITED TECHNIQUE: Right digital di

## 2021-07-16 NOTE — ED Notes (Signed)
First Nurse Note:  Pt to ED from the cancer center for cough. Per provider they were trying to treat her there but she has continued to get worse. [name] reports that pt has lost 10 pounds in 2 days, Pts blood pressure is 88/89, HR 108, 98.4 temp, and 97% on room air. Pt is on Chemo, last treatment 4/28. Pt had a critical WBC of 1.3 and PLT count of 69 this morning at cancer center.  ?

## 2021-07-16 NOTE — ED Notes (Signed)
Spoke with Dr Blaine Hamper about patients hypotension. Received verbal orders for 1L NS bolus followed by NS at 125 mL/hr ?

## 2021-07-16 NOTE — ED Triage Notes (Signed)
See first nurse note- Patient to ER from cancer center. Patient reports for the last two days she has had a productive cough, clear sputum. No known fevers. Some shortness of breath. Some centralized, non-radiating chest pain describes it as a heaviness when breathing hard/ walking.  ? ? ?

## 2021-07-16 NOTE — Assessment & Plan Note (Addendum)
Sepsis due to HCAP: pt has sepsis with neutropenia, heart rate 108, RR 23.  Lactic acid normal 0.9.  Blood pressure soft.  Currently hemodynamically stable ?Procalcitonin negative.  Checking respiratory viral panel.  MRSA PCR positive for Staph aureus.  Cultures pending.  Strep pneumo negative, Legionella pending ?-Admited to progressive unit as inpatient ?-continue IV Vancomycin and cefepime ?- Mucinex for cough  ?- Bronchodilators ?- Urine legionella and S. pneumococcal antigen ? ?

## 2021-07-16 NOTE — Assessment & Plan Note (Signed)
Patient is receiving chemotherapy.  Last dose was last week.  Patient is following up with Dr. Tasia Catchings. ?-Consulted Dr. Tasia Catchings ?

## 2021-07-16 NOTE — Assessment & Plan Note (Signed)
-   As needed Ativan ?

## 2021-07-16 NOTE — Assessment & Plan Note (Addendum)
Patient has pancytopenia and neutropenia: Likely due to chemotherapy ?-Protection precaution.  Received G-CSF on third day of her last chemo. ?No need to repeat the dose per oncology. ?-Follow-up with CBC ?

## 2021-07-16 NOTE — Consult Note (Addendum)
Pharmacy Antibiotic Note ? ?EDDY TERMINE is a 61 y.o. female w/ h/o right breast stage IIIb cT3 N0M0, grade 2, ER negative, PR negative HER2 negative breast cancer presenting w/ cough, fatigue and was found to have neutropenia admitted on 07/16/2021 with Febrile neutropenia & c/f HCAP.  Pharmacy has been consulted for Cefepime dosing. ? ?Plan: ?Initiate Cefepime 2g IV q12h  ?Initiate Vancomycin 1g x1 (loading dose ~55m/k); followed by Vancomycin 7561mIV q24h ?Goal AUC 400-600 ?Est AUC: 524; Cmax: 36; Cmin: 12.5 ?SCr 0.8 (rounded from 0.46); IBW>TBW; Vd 0.72 ? ?F/u MRSA PCR ordered & cultures. ? ? ?Height: _0  (157.5 cm) ?Weight: 43.1 kg (95 lb) ?IBW/kg (Calculated) : 50.1 ? ?Temp (24hrs), Avg:98.5 ?F (36.9 ?C), Min:98.4 ?F (36.9 ?C), Max:98.5 ?F (36.9 ?C) ? ?Recent Labs  ?Lab 07/14/21 ?1053 07/16/21 ?1001 07/16/21 ?1214  ?WBC 1.5* 1.3* 1.0*  ?CREATININE 0.68 0.57 0.46  ?LATICACIDVEN  --   --  0.9  ?  ?Estimated Creatinine Clearance: 50.2 mL/min (by C-G formula based on SCr of 0.46 mg/dL).   ? ?No Known Allergies ? ?Antimicrobials this admission: ?Cefepime/Vancomycin 5/05 >>  ? ?Dose adjustments this admission: ?CTM and adjust PRN ? ?Microbiology results: ?5/05 BCx: pending ? ?Thank you for allowing pharmacy to be a part of this patient?s care. ?BrShanon Broweers ?07/16/2021 2:52 PM ? ?

## 2021-07-16 NOTE — Progress Notes (Signed)
Pt returns for [facility] follow-up. She reports that she still has congestion, and coughing up small amounts of mucous. She reports generalized pain and overall "not feeling well". She states that she has been in bed a lot and is not eating or drinking much. She is still taking the antibiotic as prescribed.  ?

## 2021-07-16 NOTE — Assessment & Plan Note (Addendum)
Potassium decreased to 2.8 with magnesium at 1.4. ?-Repleted potassium and magnesium ?-Continue to monitor ?

## 2021-07-16 NOTE — Assessment & Plan Note (Signed)
-   Hold blood pressure medications since blood pressure is soft ?

## 2021-07-16 NOTE — ED Notes (Signed)
Secure message sent to pharmacy regarding missing medication slow mag. Requested sent to ed tube station via secure chat.  ?

## 2021-07-16 NOTE — ED Provider Notes (Addendum)
? ?[facility] ?Provider Note ? ? ? Event Date/Time  ? First MD Initiated Contact with Patient 07/16/21 1146   ?  (approximate) ? ? ?History  ? ?Cough ? ? ?HPI ? ?[name] is a 61 y.o. female  with history of invasive ductal carcinoma of right breast and as listed in EMR presents to the emergency department for treatment and evaluation of weakness, shortness of breath, weight loss, and diffuse body pain that has been worse over the past 2 days. She was evaluated 2 days ago at the cancer center and started Levaquin. Appetite has further decreased since starting antibiotic. ? ?  ? ? ?Physical Exam  ? ?Triage Vital Signs: ?ED Triage Vitals  ?Enc Vitals Group  ?   BP 07/16/21 1136 111/62  ?   Pulse Rate 07/16/21 1136 96  ?   Resp 07/16/21 1136 18  ?   Temp 07/16/21 1136 98.5 ?F (36.9 ?C)  ?   Temp Source 07/16/21 1136 Oral  ?   SpO2 07/16/21 1136 96 %  ?   Weight 07/16/21 1136 95 lb (43.1 kg)  ?   Height 07/16/21 1136 '5\' 2"'$  (1.575 m)  ?   Head Circumference --   ?   Peak Flow --   ?   Pain Score 07/16/21 1141 8  ?   Pain Loc --   ?   Pain Edu? --   ?   Excl. in [place]? --   ? ? ?Most recent vital signs: ?Vitals:  ? 07/16/21 1300 07/16/21 1540  ?BP: 105/67 (!) 91/57  ?Pulse: 89 92  ?Resp: (!) 22 18  ?Temp:    ?SpO2: 95% 96%  ? ? ?General: Awake, no distress.  ?CV:  Good peripheral perfusion.  ?Resp:  Normal effort.  Diminished throughout with rhonchi right upper and lower. ?Abd:  No distention.  ?Other:   ? ? ?ED Results / Procedures / Treatments  ? ?Labs ?(all labs ordered are listed, but only abnormal results are displayed) ?Labs Reviewed  ?COMPREHENSIVE METABOLIC PANEL - Abnormal; Notable for the following components:  ?    Result Value  ? Sodium 134 (*)   ? Potassium 3.2 (*)   ? CO2 19 (*)   ? Calcium 8.2 (*)   ? Total Protein 5.5 (*)   ? Albumin 2.9 (*)   ? AST 10 (*)   ? All other components within normal limits  ?CBC WITH DIFFERENTIAL/PLATELET - Abnormal; Notable for the following  components:  ? WBC 1.0 (*)   ? RBC 2.72 (*)   ? Hemoglobin 9.3 (*)   ? HCT 28.4 (*)   ? MCV 104.4 (*)   ? MCH 34.2 (*)   ? Platelets 55 (*)   ? Neutro Abs 0.4 (*)   ? Lymphs Abs 0.5 (*)   ? All other components within normal limits  ?D-DIMER, QUANTITATIVE - Abnormal; Notable for the following components:  ? D-Dimer, Quant 0.63 (*)   ? All other components within normal limits  ?MAGNESIUM - Abnormal; Notable for the following components:  ? Magnesium 1.4 (*)   ? All other components within normal limits  ?CULTURE, BLOOD (ROUTINE X 2)  ?CULTURE, BLOOD (ROUTINE X 2)  ?EXPECTORATED SPUTUM ASSESSMENT W GRAM STAIN, RFLX TO RESP C  ?SURGICAL PCR SCREEN  ?LACTIC ACID, PLASMA  ?PATHOLOGIST SMEAR REVIEW  ?URINALYSIS, ROUTINE W REFLEX MICROSCOPIC  ?STREP PNEUMONIAE URINARY ANTIGEN  ?LEGIONELLA PNEUMOPHILA SEROGP 1 UR AG  ?HIV ANTIBODY (ROUTINE TESTING  W REFLEX)  ?TROPONIN I (HIGH SENSITIVITY)  ? ? ? ?EKG ? ?Sinus rhythm with a rate of 93.  No ST elevation ? ? ?RADIOLOGY ? ?Image and radiology report reviewed by me.  ?My unofficial interpretation of the chest x-ray is negative for acute concerns. ? ?PROCEDURES: ? ?Critical Care performed: Yes. ? ?CRITICAL CARE ?Performed by: Sherrie George ? ? ?Total critical care time: 45 minutes ? ?Critical care time was exclusive of separately billable procedures and treating other patients. ? ?Critical care was necessary to treat or prevent imminent or life-threatening deterioration. ? ?Critical care was time spent personally by me on the following activities: development of treatment plan with patient and/or surrogate as well as nursing, discussions with consultants, evaluation of patient's response to treatment, examination of patient, obtaining history from patient or surrogate, ordering and performing treatments and interventions, ordering and review of laboratory studies, ordering and review of radiographic studies, pulse oximetry and re-evaluation of patient's  condition. ? ? ?Procedures ? ? ?MEDICATIONS ORDERED IN ED: ?Medications  ?ceFEPIme (MAXIPIME) 2 g in sodium chloride 0.9 % 100 mL IVPB (2 g Intravenous New Bag/Given 07/16/21 1531)  ?ceFEPIme (MAXIPIME) 2 g in sodium chloride 0.9 % 100 mL IVPB (has no administration in time range)  ?potassium chloride SA (KLOR-CON M) CR tablet 40 mEq (has no administration in time range)  ?ondansetron (ZOFRAN) injection 4 mg (has no administration in time range)  ?acetaminophen (TYLENOL) tablet 650 mg (has no administration in time range)  ?nicotine (NICODERM CQ - dosed in mg/24 hours) patch 21 mg (has no administration in time range)  ?albuterol (PROVENTIL) (2.5 MG/3ML) 0.083% nebulizer solution 3 mL (has no administration in time range)  ?dextromethorphan-guaiFENesin (MUCINEX DM) 30-600 MG per 12 hr tablet 1 tablet (has no administration in time range)  ?sodium chloride 0.9 % bolus 1,000 mL (has no administration in time range)  ?0.9 %  sodium chloride infusion (has no administration in time range)  ?vancomycin (VANCOCIN) IVPB 1000 mg/200 mL premix (has no administration in time range)  ?vancomycin (VANCOREADY) IVPB 750 mg/150 mL (has no administration in time range)  ?lactated ringers bolus 1,000 mL (1,000 mLs Intravenous Bolus 07/16/21 1316)  ?ondansetron (ZOFRAN) injection 4 mg (4 mg Intravenous Given 07/16/21 1316)  ?iohexol (OMNIPAQUE) 350 MG/ML injection 60 mL (60 mLs Intravenous Contrast Given 07/16/21 1356)  ? ? ? ?IMPRESSION / MDM / ASSESSMENT AND PLAN / ED COURSE  ? ?I have reviewed the triage note. ? ?Differential diagnosis includes, but is not limited to: CAP, Sepsis, deconditioning, adverse effect of medication. ? ?61 year old female presenting to the emergency department from the cancer center for concern of sepsis.  Chest x-ray 2 days ago was nondiagnostic however she was neutropenic and was started on Levaquin.  Patient states that she has been taking Levaquin.  She denies fever, nausea, vomiting, and has not had any  diarrhea over the past 2 days. ? ?Review of the note from her visit at the cancer center this morning reviewed.  Worsening neutropenia noted from the labs that were drawn.  Her white cell count is now 1.3.  Platelet count has dropped from 133 2 days ago to 69.  Otherwise, labs were at or near baseline. Weight on 07/07/21 documented as 105 lb and today 95.5 lb. ? ?Plan will be to get additional labs and monitor.  ? ? ? ?Clinical Course as of 07/16/21 1559  ?Fri Jul 16, 2021  ?1302 Chest x-ray negative for consolidation/opacity.  ? ?Blood pressure noted to  be 89/63 with history of hypotension. Fluid bolus ordered. Sinus rhythm noted on bedside monitor. Oxygen saturation 96% on room air. ? ?Patient states that she does feel hungry, but is afraid of vomiting if she eats. Zofran ordered. She was encouraged to let us know if she feels that she can tolerate food/fluids.  [CT]  ?1333 D-dimer slightly elevated. BUN/Creat normal. CTA chest ordered to rule out PE. ?Initial troponin is normal. Lactic acid level normal as well. ?Patient placed on neutropenic precautions. [CT]  ?1419 Patient evaluated by ED attending, Dr. Quentin Cornwall who also spoke with Dr. Ailene Rud. Plan will be to admit for neutropenia. Patient is afebrile here but reports chills at home. Broad spectrum antibiotics ordered. [CT]  ?1520 Patient accepted for admission by Dr. Blaine Hamper. [CT]  ?  ?Clinical Course User Index ?[CT] Donnice Nielsen B, FNP  ? ? ? ?FINAL CLINICAL IMPRESSION(S) / ED DIAGNOSES  ? ?Final diagnoses:  ?Neutropenic fever ([place])  ? ? ? ?Rx / DC Orders  ? ?ED Discharge Orders   ? ? None  ? ?  ? ? ? ?Note:  This document was prepared using Dragon voice recognition software and may include unintentional dictation errors. ?  ?Victorino Dike, FNP ?07/16/21 1524 ? ?  ?Victorino Dike, FNP ?07/16/21 1559 ? ?  ?Merlyn Lot, MD ?07/16/21 1606 ? ?

## 2021-07-16 NOTE — Telephone Encounter (Signed)
Patient mother called and left message that patient was unable to get up and could not come in today. When I called her back she said that patient is now up and trying to get ready and that her sister is coming over to help her get into the car to get to her appointment. So she will be coming. She reports that she has been sick so she may need IV fluids. ?

## 2021-07-16 NOTE — Assessment & Plan Note (Addendum)
Continue Protonix °

## 2021-07-16 NOTE — ED Notes (Signed)
Pt presents to ED with c/o of having a new cough and states she was sent here for further eval of lab values. Pt states she is a smoker and states she is producing white sputum when is able to get it out. Pt denies fevers or chills to this RN. Pt denies N/V/D. Pt is a cancer pt at this time and currenlty being treated with chemo.  ? ?Pt also reports concerns for unintentional weight loss.  ? ?Port accessed by cancer center.  ? ?Pt is A&Ox4. NAD noted at this time.  ?

## 2021-07-16 NOTE — H&P (Signed)
?History and Physical  ? ? ?Yvonne Scott DGU:[id number] DOB: March 25, 1960 DOA: 07/16/2021 ? ?Referring MD/NP/PA:  ? ?PCP: [name], NP  ? ?Patient coming from:  The patient is coming from home.   ? ? ?Chief Complaint: Cough, shortness of breath ? ?HPI: Yvonne Scott is a 61 y.o. female with medical history significant of breast cancer on chemotherapy, tobacco abuse, hypertension, GERD, anxiety, who presents with cough and shortness of breath. ? ?Pt states that she has cough and mild shortness of breath for more than 2 days.  She coughs up clear mucus.  She also has some frontal chest pain, which is pleuritic, mild to moderate, nonradiating, aggravated by deep breath.  Patient has chills, but no fever.  Complains of whole body aches.  Denies nausea vomiting, diarrhea or abdominal pain.  No symptoms of UTI.  Patient was started on Levaquin 2 days ago without significant help. ? ?Data Reviewed and ED Course: pt was found to have pancytopenia with WBC 1.0, hemoglobin 9.3, platelets 55, troponin level 4, positive D-dimer 0.63, potassium 3.2, renal function okay, lactic acid 0.9, temperature normal, soft blood pressure, heart rate 108, RR 23, oxygen saturation 94% on room air.  CT angiogram negative for PE, does showed possible right upper lobe pneumonia.  Patient is admitted to PCU as inpatient.  Dr. Tasia Catchings of oncology is consulted.  ? ?CTA: ?1. No evidence of acute pulmonary embolism. ?2. Small focus of ground-glass opacification in the anterior aspect ?of the right upper lobe may be infectious or inflammatory. ?3. Mild dependent atelectasis. ?4. Aortic Atherosclerosis (ICD10-I70.0) and Emphysema (ICD10-J43.9). ?5. Decreased size of the right breast mass, now measuring 4.3 x 2.3 ?cm. Improved right axillary adenopathy. ? ? ?EKG: I have personally reviewed.  Sinus rhythm, QTc 448, early R wave progression. ? ? ?Review of Systems:  ? ?General: no fevers, has chills, no body weight gain, has fatigue ?HEENT: no  blurry vision, hearing changes or sore throat ?Respiratory: has dyspnea, coughing, no wheezing ?CV: has chest pain, no palpitations ?GI: no nausea, vomiting, abdominal pain, diarrhea, constipation ?GU: no dysuria, burning on urination, increased urinary frequency, hematuria  ?Ext: no leg edema ?Neuro: no unilateral weakness, numbness, or tingling, no vision change or hearing loss ?Skin: no rash, no skin tear. ?MSK: No muscle spasm, no deformity, no limitation of range of movement in spin ?Heme: No easy bruising.  ?Travel history: No recent long distant travel. ? ? ?Allergy: No Known Allergies ? ?Past Medical History:  ?Diagnosis Date  ? Anxiety   ? Aortic atherosclerosis ([place]) 10/24/2016  ? Breast cancer ([place])   ? GERD (gastroesophageal reflux disease)   ? Personal history of chemotherapy   ? ? ?Past Surgical History:  ?Procedure Laterality Date  ? BREAST BIOPSY    ? ? ?Social History:  reports that she has been smoking cigarettes. She started smoking about 42 years ago. She has a 36.00 pack-year smoking history. She has never used smokeless tobacco. She reports that she does not currently use drugs. She reports that she does not drink alcohol. ? ?Family History:  ?Family History  ?Problem Relation Age of Onset  ? Cancer Mother 76  ?     breast  ? Cancer Father   ?     melonma  ? Cirrhosis Maternal Grandfather   ? Cancer Paternal Grandmother   ?     skin/breast  ?  ? ?Prior to Admission medications   ?Medication Sig Start Date End Date Taking? Authorizing  Provider  ?calcium-vitamin D (OSCAL WITH D) 500-5 MG-MCG tablet Take 2 tablets by mouth daily. 03/23/21   Earlie Server, MD  ?dexamethasone (DECADRON) 4 MG tablet TAKE 2 TABLETS BY MOUTH ONCE DAILY FOR 2 DAYS AFTER  CHEMOTHERAPY.  TAKE  WITH  FOOD. 06/01/21   Borders, Kirt Boys, NP  ?diphenoxylate-atropine (LOMOTIL) 2.5-0.025 MG tablet Take 1 tablet by mouth 4 (four) times daily as needed for diarrhea or loose stools. 05/25/21   Jacquelin Hawking, NP  ?fluconazole  (DIFLUCAN) 150 MG tablet Take 1 tablet (150 mg total) by mouth See admin instructions. Take one tablet, and repeat the second dose in 3 days. ?Patient not taking: Reported on 06/01/2021 04/06/21   Earlie Server, MD  ?folic acid (FOLVITE) 1 MG tablet Take 1 tablet (1 mg total) by mouth daily. 02/27/20   Fritzi Mandes, MD  ?gabapentin (NEURONTIN) 100 MG capsule Take 1 capsule (100 mg total) by mouth 3 (three) times daily. 05/04/21   Borders, Kirt Boys, NP  ?guaiFENesin (MUCINEX) 600 MG 12 hr tablet Take 2 tablets (1,200 mg total) by mouth 2 (two) times daily. 10/17/16   Gladstone Lighter, MD  ?ibuprofen (ADVIL) 200 MG tablet Take 200 mg by mouth every 6 (six) hours as needed for headache.    [provider]  ?levofloxacin (LEVAQUIN) 750 MG tablet Take 1 tablet (750 mg total) by mouth daily for 7 days. 07/14/21 07/21/21  Hughie Closs, PA-C  ?lidocaine-prilocaine (EMLA) cream Apply to affected area once 02/27/21   Earlie Server, MD  ?loperamide (IMODIUM) 2 MG capsule Take 1 capsule (2 mg total) by mouth See admin instructions. Initial: 4 mg, followed by 2 mg after each loose stool; maximum: 16 mg/day 05/18/21   Earlie Server, MD  ?LORazepam (ATIVAN) 0.5 MG tablet Take 1 tablet (0.5 mg total) by mouth every 12 (twelve) hours as needed for anxiety (nausea). 05/11/21   Earlie Server, MD  ?magic mouthwash (multi-ingredient) oral suspension Take 5 mLs by mouth 4 (four) times daily as needed for mouth pain. ?Patient not taking: Reported on 07/14/2021 03/19/21   Earlie Server, MD  ?magnesium chloride (SLOW-MAG) 64 MG TBEC SR tablet Take 1 tablet (64 mg total) by mouth 2 (two) times daily. 03/30/21   Earlie Server, MD  ?Multiple Vitamin (MULTIVITAMIN WITH MINERALS) TABS tablet Take 1 tablet by mouth daily. 02/27/20   Fritzi Mandes, MD  ?omeprazole (PRILOSEC) 20 MG capsule Take 1 capsule (20 mg total) by mouth 2 (two) times daily before a meal. 06/22/21   Earlie Server, MD  ?ondansetron (ZOFRAN) 8 MG tablet TAKE 1 TABLET BY MOUTH TWICE DAILY AS NEEDED. START THE THIRD  DAY AFTER CARBOPLATIN AND AC CHEMOTHERAPY. 06/14/21   Earlie Server, MD  ?potassium chloride SA (KLOR-CON M) 20 MEQ tablet Take 1 tablet (20 mEq total) by mouth 2 (two) times daily. 07/07/21   Earlie Server, MD  ?prochlorperazine (COMPAZINE) 10 MG tablet Take 1 tablet (10 mg total) by mouth every 6 (six) hours as needed (Nausea or vomiting). 02/27/21   Earlie Server, MD  ?thiamine 100 MG tablet Take 0.5 tablets (50 mg total) by mouth daily. 02/27/20   Fritzi Mandes, MD  ?VENTOLIN HFA 108 (90 Base) MCG/ACT inhaler Inhale 2 puffs into the lungs 4 (four) times daily as needed. 04/28/21   [provider]  ? ? ?Physical Exam: ?Vitals:  ? 07/16/21 1136 07/16/21 1213 07/16/21 1300 07/16/21 1540  ?BP: 111/62 92/63 105/67 (!) 91/57  ?Pulse: 96 89 89 92  ?Resp: 18 (!)  23 (!) 22 18  ?Temp: 98.5 ?F (36.9 ?C)     ?TempSrc: Oral     ?SpO2: 96% 94% 95% 96%  ?Weight: 43.1 kg     ?Height: '5\' 2"'$  (1.575 m)     ? ?General: Not in acute distress ?HEENT: ?      Eyes: PERRL, EOMI, no scleral icterus. ?      ENT: No discharge from the ears and nose, no pharynx injection, no tonsillar enlargement.  ?      Neck: No JVD, no bruit, no mass felt. ?Heme: No neck lymph node enlargement. ?Cardiac: S1/S2, RRR, No murmurs, No gallops or rubs. ?Respiratory: No rales, wheezing, rhonchi or rubs. ?GI: Soft, nondistended, nontender, no rebound pain, no organomegaly, BS present. ?GU: No hematuria ?Ext: No pitting leg edema bilaterally. 1+DP/PT pulse bilaterally. ?Musculoskeletal: No joint deformities, No joint redness or warmth, no limitation of ROM in spin. ?Skin: No rashes.  ?Neuro: Alert, oriented X3, cranial nerves II-XII grossly intact, moves all extremities normally. ?Psych: Patient is not psychotic, no suicidal or hemocidal ideation. ? ?Labs on Admission: I have personally reviewed following labs and imaging studies ? ?CBC: ?Recent Labs  ?Lab 07/14/21 ?1053 07/16/21 ?1001 07/16/21 ?1214  ?WBC 1.5* 1.3* 1.0*  ?NEUTROABS 0.1* 0.4* 0.4*  ?HGB 10.7* 11.1* 9.3*   ?HCT 31.8* 32.6* 28.4*  ?MCV 105.3* 102.5* 104.4*  ?PLT 133* 69* 55*  ? ?Basic Metabolic Panel: ?Recent Labs  ?Lab 07/14/21 ?1053 07/16/21 ?1001 07/16/21 ?1214 07/16/21 ?1236  ?NA 129* 129* 134*  --   ?K 4.3 3.2* 3.

## 2021-07-16 NOTE — Assessment & Plan Note (Signed)
See above

## 2021-07-16 NOTE — ED Notes (Signed)
Critical WBC and other values given to NP face to face, neutropenic precautions placed.  ?

## 2021-07-16 NOTE — ED Notes (Signed)
Pt at XRAY

## 2021-07-16 NOTE — Progress Notes (Signed)
Pt transported to ED for further evaluation, as requested by Nelwyn Salisbury, Stallings. Port remains accessed.  ?

## 2021-07-16 NOTE — Assessment & Plan Note (Signed)
-  Nicotine patch 

## 2021-07-17 ENCOUNTER — Encounter: Payer: Self-pay | Admitting: Internal Medicine

## 2021-07-17 ENCOUNTER — Inpatient Hospital Stay: Payer: Medicaid Other

## 2021-07-17 DIAGNOSIS — Z171 Estrogen receptor negative status [ER-]: Secondary | ICD-10-CM

## 2021-07-17 DIAGNOSIS — J189 Pneumonia, unspecified organism: Secondary | ICD-10-CM | POA: Diagnosis not present

## 2021-07-17 DIAGNOSIS — D709 Neutropenia, unspecified: Secondary | ICD-10-CM | POA: Diagnosis not present

## 2021-07-17 DIAGNOSIS — D61818 Other pancytopenia: Secondary | ICD-10-CM

## 2021-07-17 DIAGNOSIS — E876 Hypokalemia: Secondary | ICD-10-CM | POA: Diagnosis not present

## 2021-07-17 DIAGNOSIS — R5081 Fever presenting with conditions classified elsewhere: Secondary | ICD-10-CM

## 2021-07-17 DIAGNOSIS — C50911 Malignant neoplasm of unspecified site of right female breast: Secondary | ICD-10-CM

## 2021-07-17 LAB — BASIC METABOLIC PANEL
Anion gap: 7 (ref 5–15)
BUN: 7 mg/dL — ABNORMAL LOW (ref 8–23)
CO2: 19 mmol/L — ABNORMAL LOW (ref 22–32)
Calcium: 7.9 mg/dL — ABNORMAL LOW (ref 8.9–10.3)
Chloride: 108 mmol/L (ref 98–111)
Creatinine, Ser: 0.6 mg/dL (ref 0.44–1.00)
GFR, Estimated: >60 mL/min (ref >60)
Glucose, Bld: 99 mg/dL (ref 70–99)
Potassium: 2.8 mmol/L — ABNORMAL LOW (ref 3.5–5.1)
Sodium: 134 mmol/L — ABNORMAL LOW (ref 135–145)

## 2021-07-17 LAB — CBC
HCT: 26.9 % — ABNORMAL LOW (ref 36.0–46.0)
Hemoglobin: 8.7 g/dL — ABNORMAL LOW (ref 12.0–15.0)
MCH: 34.1 pg — ABNORMAL HIGH (ref 26.0–34.0)
MCHC: 32.3 g/dL (ref 30.0–36.0)
MCV: 105.5 fL — ABNORMAL HIGH (ref 80.0–100.0)
Platelets: 34 10*3/uL — ABNORMAL LOW (ref 150–400)
RBC: 2.55 MIL/uL — ABNORMAL LOW (ref 3.87–5.11)
RDW: 14.6 % (ref 11.5–15.5)
WBC: 2.9 10*3/uL — ABNORMAL LOW (ref 4.0–10.5)
nRBC: 0 % (ref 0.0–0.2)

## 2021-07-17 LAB — SURGICAL PCR SCREEN
MRSA, PCR: NEGATIVE
Staphylococcus aureus: POSITIVE — AB

## 2021-07-17 LAB — PROCALCITONIN: Procalcitonin: <0.1 ng/mL

## 2021-07-17 MED ORDER — CHLORHEXIDINE GLUCONATE CLOTH 2 % EX PADS
6.0000 | MEDICATED_PAD | Freq: Every day | CUTANEOUS | Status: DC
  Administered 2021-07-17 – 2021-07-19 (×2): 6 via TOPICAL

## 2021-07-17 MED ORDER — ALTEPLASE 2 MG IJ SOLR
2.0000 mg | Freq: Once | INTRAMUSCULAR | Status: AC
  Administered 2021-07-17: 2 mg
  Filled 2021-07-17: qty 2

## 2021-07-17 MED ORDER — POTASSIUM CHLORIDE CRYS ER 20 MEQ PO TBCR
40.0000 meq | EXTENDED_RELEASE_TABLET | ORAL | Status: AC
  Administered 2021-07-17 (×2): 40 meq via ORAL
  Filled 2021-07-17 (×2): qty 2

## 2021-07-17 MED ORDER — MAGNESIUM SULFATE 2 GM/50ML IV SOLN
2.0000 g | Freq: Once | INTRAVENOUS | Status: AC
  Administered 2021-07-17: 2 g via INTRAVENOUS
  Filled 2021-07-17: qty 50

## 2021-07-17 MED ORDER — MUPIROCIN 2 % EX OINT
1.0000 "application " | TOPICAL_OINTMENT | Freq: Two times a day (BID) | CUTANEOUS | Status: DC
  Administered 2021-07-17 – 2021-07-19 (×4): 1 via NASAL
  Filled 2021-07-17: qty 22

## 2021-07-17 NOTE — ED Notes (Signed)
Pt experiencing a nose bleed, she states this is a normal occurrence for her and normally resolves on its own. Pt instructed to place pressure.  ?

## 2021-07-17 NOTE — Progress Notes (Signed)
?Progress Note ? ? ?Patient: Yvonne Scott IZT:[id number] DOB: 08-11-1960 DOA: 07/16/2021     1 ?DOS: the patient was seen and examined on 07/17/2021 ?  ?Brief hospital course: ?Taken from H&P. ? ?Yvonne Scott is a 61 y.o. female with medical history significant of breast cancer on chemotherapy, tobacco abuse, hypertension, GERD, anxiety, who presents with cough and shortness of breath. ?  ?Pt states that she has cough and mild shortness of breath for more than 2 days.  She coughs up clear mucus.  She also has some frontal chest pain, which is pleuritic, mild to moderate, nonradiating, aggravated by deep breath.  Patient has chills, but no fever.  Complains of whole body aches.  Denies nausea vomiting, diarrhea or abdominal pain.  No symptoms of UTI.  Patient was started on Levaquin 2 days ago without significant help. ?  ?pt was found to have pancytopenia with WBC 1.0, hemoglobin 9.3, platelets 55, troponin level 4, positive D-dimer 0.63, potassium 3.2, renal function okay, lactic acid 0.9, temperature normal, soft blood pressure, heart rate 108, RR 23, oxygen saturation 94% on room air.  CT angiogram negative for PE, does showed possible right upper lobe pneumonia. ?Procalcitonin negative.  Cultures pending ? ?Last chemotherapy was ddAC + keytruda on 07/07/21. Patient received GCSF on Day 3.  ?Patient has an history of developing cytopenias after the treatment. ? ?Oncology was consulted-there is no need for another GCSF at this time. ? ?Bilateral lower extremity venous Doppler was negative for DVT. ? ?MRSA swab was positive for Staph aureus, no methicillin resistance. ? ?Patient was started on cefepime and vancomycin. ? ? ?Assessment and Plan: ?* HCAP (healthcare-associated pneumonia) ?Sepsis due to HCAP: pt has sepsis with neutropenia, heart rate 108, RR 23.  Lactic acid normal 0.9.  Blood pressure soft.  Currently hemodynamically stable ?Procalcitonin negative.  Checking respiratory viral panel.  MRSA PCR  positive for Staph aureus.  Cultures pending.  Strep pneumo negative, Legionella pending ?-Admited to progressive unit as inpatient ?-continue IV Vancomycin and cefepime ?- Mucinex for cough  ?- Bronchodilators ?- Urine legionella and S. pneumococcal antigen ? ? ?Pancytopenia (Yvonne Scott) ?Patient has pancytopenia and neutropenia: Likely due to chemotherapy ?-Protection precaution.  Received G-CSF on third day of her last chemo. ?No need to repeat the dose per oncology. ?-Follow-up with CBC ? ?Sepsis (Yvonne Scott) ?See above ? ?GERD (gastroesophageal reflux disease) ?- Continue Protonix ? ?Tobacco abuse ?- Nicotine patch ? ?Essential hypertension, benign ?- Hold blood pressure medications since blood pressure is soft ? ?Hypokalemia ?Potassium decreased to 2.8 with magnesium at 1.4. ?-Repleted potassium and magnesium ?-Continue to monitor ? ?Anxiety ?- As needed Ativan ? ?Breast cancer (Yvonne Scott) ?Patient is receiving chemotherapy.  Last dose was last week.  Patient is following up with Dr. Tasia Catchings. ?-Consulted Dr. Tasia Catchings ? ? ?Subjective: Patient continued to have some cough and congestion, stating that it is improving.  No urinary symptoms.  She was feeling hungry and asking for breakfast.  Denies any nausea, vomiting or diarrhea. ? ?Physical Exam: ?Vitals:  ? 07/17/21 1200 07/17/21 1300 07/17/21 1345 07/17/21 1413  ?BP: 101/61 111/73  115/71  ?Pulse: 83 86  91  ?Resp: 17 19    ?Temp:    98 ?F (36.7 ?C)  ?TempSrc:      ?SpO2: 95% 98%  99%  ?Weight:   46.9 kg   ?Height:   '5\' 2"'$  (1.575 m)   ? ?General.     In no acute distress. ?Pulmonary.  Bilateral rhonchi,  normal respiratory effort. ?CV.  Regular rate and rhythm, no JVD, rub or murmur. ?Abdomen.  Soft, nontender, nondistended, BS positive. ?CNS.  Alert and oriented .  No focal neurologic deficit. ?Extremities.  No edema, no cyanosis, pulses intact and symmetrical. ?Psychiatry.  Judgment and insight appears normal. ? ?Data Reviewed: ?Notes, labs and images reviewed ? ?Family Communication:  Discussed with patient ? ?Disposition: ?Status is: Inpatient ?Remains inpatient appropriate because: Severity of illness ? ? Planned Discharge Destination: Home ? ?DVT prophylaxis.  SCDs-thrombocytopenia ?Time spent: 50 minutes ? ?This record has been created using Systems analyst. Errors have been sought and corrected,but may not always be located. Such creation errors do not reflect on the standard of care. ? ?Author: ?Lorella Nimrod, MD ?07/17/2021 4:21 PM ? ?For on call review [email or web address].  ?

## 2021-07-17 NOTE — Hospital Course (Addendum)
Taken from H&P. ? ?Yvonne Scott is a 61 y.o. female with medical history significant of breast cancer on chemotherapy, tobacco abuse, hypertension, GERD, anxiety, who presents with cough and shortness of breath. ?  ?Pt states that she has cough and mild shortness of breath for more than 2 days.  She coughs up clear mucus.  She also has some frontal chest pain, which is pleuritic, mild to moderate, nonradiating, aggravated by deep breath.  Patient has chills, but no fever.  Complains of whole body aches.  Denies nausea vomiting, diarrhea or abdominal pain.  No symptoms of UTI.  Patient was started on Levaquin 2 days ago without significant help. ?  ?pt was found to have pancytopenia with WBC 1.0, hemoglobin 9.3, platelets 55, troponin level 4, positive D-dimer 0.63, potassium 3.2, renal function okay, lactic acid 0.9, temperature normal, soft blood pressure, heart rate 108, RR 23, oxygen saturation 94% on room air.  CT angiogram negative for PE, does showed possible right upper lobe pneumonia. ?Procalcitonin negative.  Cultures pending ? ?Last chemotherapy was ddAC + keytruda on 07/07/21. Patient received GCSF on Day 3.  ?Patient has an history of developing cytopenias after the treatment. ? ?Oncology was consulted-there is no need for another GCSF at this time. ? ?Bilateral lower extremity venous Doppler was negative for DVT. ? ?MRSA swab was positive for Staph aureus, no methicillin resistance. ? ?Patient was started on cefepime and vancomycin. ? ?5/7: Patient remained afebrile, leukopenia improved, slight worsening of hemoglobin and thrombocytopenia.  Vancomycin was discontinued.  Initial plan was to discharge on Levaquin and oncology will follow her up as outpatient. ?Patient was complaining of some nausea and not feeling well and does not think that she is safe to go home today. ? ?5/8: Patient remained stable.  Able to tolerate meal.  Still feeling weak.  She is being discharged on Levaquin which she got  from her oncologist. ?Cell counts improving. ?Patient need to have a close follow-up with oncology for further recommendations. ?

## 2021-07-17 NOTE — Progress Notes (Signed)
Pt has left PAC.  I believe it was accessed in ED.  She is now in 238 on [facility] 2A.  I have repositioned needle, changed dressing, multiple flushes.  It is difficult to get blood return: it will return with multiple flushes.  It is not patent enough to run an IV solution - NS at 125 will not run.  Possibly need TPA?  IV Team consult placed.   ? ?

## 2021-07-17 NOTE — ED Notes (Signed)
Lab requests recollection of morning labs. Labs hemolyzed.  ?

## 2021-07-17 NOTE — Consult Note (Signed)
? ?Hematology/Oncology Consult note ?Telephone:([phone]) [phone] [phone] ? ?  ? ? ?Patient Care Team: ?[name], NP as PCP - General (Nurse Practitioner)  ? ?Name of the patient: Yvonne Scott  ?[id number]  ?07-24-1960  ? ?Date of visit: 07/17/21 ?REASON FOR COSULTATION:  ?Neutropenia  ?History of presenting illness-  ?61 y.o. female with PMH listed at below who was sent to ER after being seen at cancer center for generalized weakness, SOB, cough.  ? ?Patient is on neoadjuvant chemotherapy for right breast cancer treatments.  ?Last chemotherapy was ddAC + keytruda on 07/07/21. Patient received GCSF on Day 3.  ? ?She has developed cytopenia after treatments, ?07/14/2021 ANC was 0.1, patient was started on prophylactic levaquin.  ?07/16/2021 she was evaluated again and fatigue and malaise worse, tachycardia,  ANC 0.4, sent to ER for further evaluation.  ? ?07/16/21  CT angiogram negative for PE, + right upper lobe ground glass opacification.  ?She is admitted for treatment of pneumonia.  ? ?07/17/21 bilateral lower extremity US showed negative DVT. ? ?Oncology was consulted for evaluation.  ? ?She reports feeling slightly better, afebrile today.  ?+ cough ? ?Review of Systems  ?Constitutional:  Positive for fatigue. Negative for chills and fever.  ?HENT:   Negative for hearing loss and voice change.   ?Eyes:  Negative for eye problems.  ?Respiratory:  Positive for cough and shortness of breath. Negative for chest tightness.   ?Cardiovascular:  Negative for chest pain.  ?Gastrointestinal:  Negative for abdominal distention, abdominal pain and blood in stool.  ?Endocrine: Negative for hot flashes.  ?Genitourinary:  Negative for difficulty urinating and frequency.   ?Musculoskeletal:  Negative for arthralgias.  ?Skin:  Negative for itching and rash.  ?Neurological:  Negative for extremity weakness.  ?Hematological:  Negative for adenopathy.  ?Psychiatric/Behavioral:  Negative for confusion.   ? ?No Known  Allergies ? ?Patient Active Problem List  ? Diagnosis Date Noted  ? HCAP (healthcare-associated pneumonia) 07/16/2021  ? Sepsis ([place]) 07/16/2021  ? Breast cancer ([place])   ? Pancytopenia ([place])   ? Port-A-Cath in place 07/07/2021  ? Anxiety associated with cancer diagnosis ([place]) 07/07/2021  ? Chemotherapy-induced neuropathy ([place]) 06/08/2021  ? Anxiety 06/08/2021  ? Hypomagnesemia 04/13/2021  ? Generalized body aches 03/11/2021  ? Neoplasm related pain 03/11/2021  ? Encounter for antineoplastic chemotherapy 03/11/2021  ? Encounter for monitoring cardiotoxic drug therapy 02/27/2021  ? Invasive ductal carcinoma of right breast ([place]) 02/20/2021  ? Goals of care, counseling/discussion 02/20/2021  ? Metabolic acidosis 68/05/2120  ? DKA (diabetic ketoacidosis) ([place]) 02/21/2020  ? Elevated LFTs 02/21/2020  ? AKI (acute kidney injury) ([place]) 02/21/2020  ? Leukocytosis 02/21/2020  ? Lump of right breast 02/21/2020  ? Hyponatremia 01/30/2017  ? Hypochloremia 01/30/2017  ? Hypokalemia 01/27/2017  ? Hyperglycemia 01/27/2017  ? Back pain 12/15/2016  ? Chronic lower back pain 12/15/2016  ? Elevated ferritin 11/07/2016  ? Aortic atherosclerosis ([place]) 10/24/2016  ? Fatty liver 10/24/2016  ? Medication monitoring encounter 11/19/2015  ? GERD (gastroesophageal reflux disease) 11/19/2015  ? Tobacco abuse 11/19/2015  ? Essential hypertension, benign 11/19/2015  ? ? ? ?Past Medical History:  ?Diagnosis Date  ? Anxiety   ? Aortic atherosclerosis ([place]) 10/24/2016  ? Breast cancer ([place])   ? GERD (gastroesophageal reflux disease)   ? Personal history of chemotherapy   ? ? ? ?Past Surgical History:  ?Procedure Laterality Date  ? BREAST BIOPSY    ? ? ?Social History  ? ?Socioeconomic History  ?  Marital status: Widowed  ?  Spouse name: Not on file  ? Number of children: Not on file  ? Years of education: Not on file  ? Highest education level: Not on file  ?Occupational History  ? Not on file  ?Tobacco Use  ? Smoking status: Every Day  ?   Packs/day: 1.00  ?  Years: 36.00  ?  Pack years: 36.00  ?  Types: Cigarettes  ?  Start date: 03/15/1979  ? Smokeless tobacco: Never  ?Vaping Use  ? Vaping Use: Never used  ?Substance and Sexual Activity  ? Alcohol use: No  ?  Alcohol/week: 3.0 standard drinks  ?  Types: 3 Standard drinks or equivalent per week  ? Drug use: Not Currently  ? Sexual activity: Not Currently  ?  Birth control/protection: None  ?Other Topics Concern  ? Not on file  ?Social History Narrative  ? ** Merged History Encounter **  ?    ? ?Social Determinants of Health  ? ?Financial Resource Strain: Not on file  ?Food Insecurity: Not on file  ?Transportation Needs: Not on file  ?Physical Activity: Not on file  ?Stress: Not on file  ?Social Connections: Not on file  ?Intimate Partner Violence: Not on file  ? ?  ?Family History  ?Problem Relation Age of Onset  ? Cancer Mother 60  ?     breast  ? Cancer Father   ?     melonma  ? Cirrhosis Maternal Grandfather   ? Cancer Paternal Grandmother   ?     skin/breast  ? ? ? ?Current Facility-Administered Medications:  ?  0.9 %  sodium chloride infusion, , Intravenous, Continuous, Ivor Costa, MD, Last Rate: 125 mL/hr at 07/17/21 0322, New Bag at 07/17/21 9147 ?  acetaminophen (TYLENOL) tablet 650 mg, 650 mg, Oral, Q6H PRN, Ivor Costa, MD ?  albuterol (PROVENTIL) (2.5 MG/3ML) 0.083% nebulizer solution 3 mL, 3 mL, Inhalation, Q4H PRN, Ivor Costa, MD ?  calcium-vitamin D (OSCAL WITH D) 500-5 MG-MCG per tablet 2 tablet, 2 tablet, Oral, Daily, Ivor Costa, MD, 2 tablet at 07/16/21 1833 ?  ceFEPIme (MAXIPIME) 2 g in sodium chloride 0.9 % 100 mL IVPB, 2 g, Intravenous, Q12H, Lorna Dibble, RPH, Stopped at 07/17/21 8295 ?  dextromethorphan-guaiFENesin ([place] DM) 30-600 MG per 12 hr tablet 1 tablet, 1 tablet, Oral, BID PRN, Ivor Costa, MD ?  diphenoxylate-atropine (LOMOTIL) 2.5-0.025 MG per tablet 1 tablet, 1 tablet, Oral, QID PRN, Ivor Costa, MD ?  folic acid (FOLVITE) tablet 1 mg, 1 mg, Oral, Daily, Ivor Costa,  MD, 1 mg at 07/16/21 1832 ?  gabapentin (NEURONTIN) capsule 100 mg, 100 mg, Oral, TID, Ivor Costa, MD, 100 mg at 07/16/21 2318 ?  LORazepam (ATIVAN) tablet 0.5 mg, 0.5 mg, Oral, Q12H PRN, Ivor Costa, MD ?  magnesium chloride (SLOW-MAG) 64 MG SR tablet 64 mg, 1 tablet, Oral, BID, Ivor Costa, MD ?  multivitamin with minerals tablet 1 tablet, 1 tablet, Oral, Daily, Ivor Costa, MD, 1 tablet at 07/16/21 1833 ?  nicotine (NICODERM CQ - dosed in mg/24 hours) patch 21 mg, 21 mg, Transdermal, Daily, Ivor Costa, MD, 21 mg at 07/16/21 1827 ?  ondansetron (ZOFRAN) injection 4 mg, 4 mg, Intravenous, Q8H PRN, Ivor Costa, MD ?  oxyCODONE-acetaminophen (PERCOCET/ROXICET) 5-325 MG per tablet 1 tablet, 1 tablet, Oral, Q4H PRN, Ivor Costa, MD ?  pantoprazole (PROTONIX) EC tablet 40 mg, 40 mg, Oral, Daily, Ivor Costa, MD, 40 mg at 07/16/21 2007 ?  thiamine tablet 50 mg, 50 mg, Oral, Daily, Ivor Costa, MD, 50 mg at 07/16/21 1829 ?  vancomycin (VANCOREADY) IVPB 750 mg/150 mL, 750 mg, Intravenous, Q24H, Beers, Shanon Brow, RPH ? ?Current Outpatient Medications:  ?  calcium-vitamin D (OSCAL WITH D) 500-5 MG-MCG tablet, Take 2 tablets by mouth daily., Disp: 60 tablet, Rfl: 5 ?  dexamethasone (DECADRON) 4 MG tablet, TAKE 2 TABLETS BY MOUTH ONCE DAILY FOR 2 DAYS AFTER  CHEMOTHERAPY.  TAKE  WITH  FOOD., Disp: 15 tablet, Rfl: 0 ?  folic acid (FOLVITE) 1 MG tablet, Take 1 tablet (1 mg total) by mouth daily., Disp: 30 tablet, Rfl: 0 ?  gabapentin (NEURONTIN) 100 MG capsule, Take 1 capsule (100 mg total) by mouth 3 (three) times daily., Disp: 90 capsule, Rfl: 0 ?  levofloxacin (LEVAQUIN) 750 MG tablet, Take 1 tablet (750 mg total) by mouth daily for 7 days., Disp: 7 tablet, Rfl: 0 ?  LORazepam (ATIVAN) 0.5 MG tablet, Take 1 tablet (0.5 mg total) by mouth every 12 (twelve) hours as needed for anxiety (nausea)., Disp: 15 tablet, Rfl: 0 ?  magnesium chloride (SLOW-MAG) 64 MG TBEC SR tablet, Take 1 tablet (64 mg total) by mouth 2 (two) times daily.,  Disp: 60 tablet, Rfl: 2 ?  Multiple Vitamin (MULTIVITAMIN WITH MINERALS) TABS tablet, Take 1 tablet by mouth daily., Disp: 30 tablet, Rfl: 0 ?  omeprazole (PRILOSEC) 20 MG capsule, Take 1 capsule (20 mg total) by mouth

## 2021-07-18 DIAGNOSIS — E876 Hypokalemia: Secondary | ICD-10-CM | POA: Diagnosis not present

## 2021-07-18 DIAGNOSIS — C50911 Malignant neoplasm of unspecified site of right female breast: Secondary | ICD-10-CM | POA: Diagnosis not present

## 2021-07-18 DIAGNOSIS — F419 Anxiety disorder, unspecified: Secondary | ICD-10-CM

## 2021-07-18 DIAGNOSIS — J189 Pneumonia, unspecified organism: Secondary | ICD-10-CM | POA: Diagnosis not present

## 2021-07-18 LAB — BASIC METABOLIC PANEL
Anion gap: 7 (ref 5–15)
BUN: <5 mg/dL — ABNORMAL LOW (ref 8–23)
CO2: 19 mmol/L — ABNORMAL LOW (ref 22–32)
Calcium: 7.6 mg/dL — ABNORMAL LOW (ref 8.9–10.3)
Chloride: 113 mmol/L — ABNORMAL HIGH (ref 98–111)
Creatinine, Ser: 0.5 mg/dL (ref 0.44–1.00)
GFR, Estimated: >60 mL/min (ref >60)
Glucose, Bld: 106 mg/dL — ABNORMAL HIGH (ref 70–99)
Potassium: 3.3 mmol/L — ABNORMAL LOW (ref 3.5–5.1)
Sodium: 139 mmol/L (ref 135–145)

## 2021-07-18 LAB — CBC WITH DIFFERENTIAL/PLATELET
Abs Immature Granulocytes: 0.45 10*3/uL — ABNORMAL HIGH (ref 0.00–0.07)
Basophils Absolute: 0.1 10*3/uL (ref 0.0–0.1)
Basophils Relative: 1 %
Eosinophils Absolute: 0 10*3/uL (ref 0.0–0.5)
Eosinophils Relative: 0 %
HCT: 24.5 % — ABNORMAL LOW (ref 36.0–46.0)
Hemoglobin: 8 g/dL — ABNORMAL LOW (ref 12.0–15.0)
Immature Granulocytes: 8 %
Lymphocytes Relative: 27 %
Lymphs Abs: 1.5 10*3/uL (ref 0.7–4.0)
MCH: 33.9 pg (ref 26.0–34.0)
MCHC: 32.7 g/dL (ref 30.0–36.0)
MCV: 103.8 fL — ABNORMAL HIGH (ref 80.0–100.0)
Monocytes Absolute: 0.6 10*3/uL (ref 0.1–1.0)
Monocytes Relative: 11 %
Neutro Abs: 2.8 10*3/uL (ref 1.7–7.7)
Neutrophils Relative %: 53 %
Platelets: 31 10*3/uL — ABNORMAL LOW (ref 150–400)
RBC: 2.36 MIL/uL — ABNORMAL LOW (ref 3.87–5.11)
RDW: 14.8 % (ref 11.5–15.5)
WBC: 5.4 10*3/uL (ref 4.0–10.5)
nRBC: 0 % (ref 0.0–0.2)

## 2021-07-18 LAB — RESPIRATORY PANEL BY PCR

## 2021-07-18 LAB — MAGNESIUM: Magnesium: 1.9 mg/dL (ref 1.7–2.4)

## 2021-07-18 MED ORDER — CALCIUM CARBONATE ANTACID 500 MG PO CHEW
1.0000 | CHEWABLE_TABLET | Freq: Three times a day (TID) | ORAL | Status: DC | PRN
  Administered 2021-07-18: 200 mg via ORAL
  Filled 2021-07-18: qty 1

## 2021-07-18 MED ORDER — PROCHLORPERAZINE EDISYLATE 10 MG/2ML IJ SOLN
10.0000 mg | Freq: Four times a day (QID) | INTRAMUSCULAR | Status: DC | PRN
  Administered 2021-07-18: 10 mg via INTRAVENOUS
  Filled 2021-07-18: qty 2

## 2021-07-18 MED ORDER — POTASSIUM CHLORIDE CRYS ER 20 MEQ PO TBCR
40.0000 meq | EXTENDED_RELEASE_TABLET | Freq: Once | ORAL | Status: AC
  Administered 2021-07-18: 40 meq via ORAL
  Filled 2021-07-18: qty 2

## 2021-07-18 NOTE — Assessment & Plan Note (Signed)
Patient has anemia and thrombocytopenia now,: Leukopenia improved.  Likely due to chemotherapy ?-Protection precaution.  Received GCSF on third day of her last chemo. ?No need to repeat the dose per oncology. ?-Follow-up with CBC ?

## 2021-07-18 NOTE — Assessment & Plan Note (Signed)
See above

## 2021-07-18 NOTE — Assessment & Plan Note (Signed)
Sepsis due to HCAP: pt has sepsis with neutropenia, heart rate 108, RR 23.  Lactic acid normal 0.9.  Blood pressure soft.  Currently hemodynamically stable ?Procalcitonin negative.  Checking respiratory viral panel.  MRSA PCR positive for Staph aureus, no resistance.  Cultures pending.  Strep pneumo negative, Legionella pending ?Vancomycin was discontinued ?-continue IV  cefepime ?- Mucinex for cough  ?- Bronchodilators ? ?

## 2021-07-18 NOTE — Assessment & Plan Note (Signed)
-   Continue Protonix ?-Added some Tums for indigestion ?

## 2021-07-18 NOTE — Assessment & Plan Note (Signed)
Potassium still low at 3.3 but improved than before.  Magnesium now within normal limit. ?-Repleted potassium and monitor ?-Continue to monitor ?

## 2021-07-18 NOTE — Progress Notes (Signed)
?Progress Note ? ? ?Patient: Yvonne Scott ZOX:[id number] DOB: [birth year] DOA: 07/16/2021     2 ?DOS: the patient was seen and examined on 07/18/2021 ?  ?Brief hospital course: ?Taken from H&P. ? ?MADDEN PIAZZA is a 61 y.o. female with medical history significant of breast cancer on chemotherapy, tobacco abuse, hypertension, GERD, anxiety, who presents with cough and shortness of breath. ?  ?Pt states that she has cough and mild shortness of breath for more than 2 days.  She coughs up clear mucus.  She also has some frontal chest pain, which is pleuritic, mild to moderate, nonradiating, aggravated by deep breath.  Patient has chills, but no fever.  Complains of whole body aches.  Denies nausea vomiting, diarrhea or abdominal pain.  No symptoms of UTI.  Patient was started on Levaquin 2 days ago without significant help. ?  ?pt was found to have pancytopenia with WBC 1.0, hemoglobin 9.3, platelets 55, troponin level 4, positive D-dimer 0.63, potassium 3.2, renal function okay, lactic acid 0.9, temperature normal, soft blood pressure, heart rate 108, RR 23, oxygen saturation 94% on room air.  CT angiogram negative for PE, does showed possible right upper lobe pneumonia. ?Procalcitonin negative.  Cultures pending ? ?Last chemotherapy was ddAC + keytruda on 07/07/21. Patient received GCSF on Day 3.  ?Patient has an history of developing cytopenias after the treatment. ? ?Oncology was consulted-there is no need for another GCSF at this time. ? ?Bilateral lower extremity venous Doppler was negative for DVT. ? ?MRSA swab was positive for Staph aureus, no methicillin resistance. ? ?Patient was started on cefepime and vancomycin. ? ?5/7: Patient remained afebrile, leukopenia improved, slight worsening of hemoglobin and thrombocytopenia.  Vancomycin was discontinued.  Initial plan was to discharge on Levaquin and oncology will follow her up as outpatient. ?Patient was complaining of some nausea and not feeling well and does  not think that she is safe to go home today. ? ? ?Assessment and Plan: ?* HCAP (healthcare-associated pneumonia) ?Sepsis due to HCAP: pt has sepsis with neutropenia, heart rate 108, RR 23.  Lactic acid normal 0.9.  Blood pressure soft.  Currently hemodynamically stable ?Procalcitonin negative.  Checking respiratory viral panel.  MRSA PCR positive for Staph aureus, no resistance.  Cultures pending.  Strep pneumo negative, Legionella pending ?Vancomycin was discontinued ?-continue IV  cefepime ?- Mucinex for cough  ?- Bronchodilators ? ? ?Pancytopenia ([place]) ?Patient has anemia and thrombocytopenia now,: Leukopenia improved.  Likely due to chemotherapy ?-Protection precaution.  Received GCSF on third day of her last chemo. ?No need to repeat the dose per oncology. ?-Follow-up with CBC ? ?Sepsis ([place]) ?See above ? ?GERD (gastroesophageal reflux disease) ?- Continue Protonix ?-Added some Tums for indigestion ? ?Tobacco abuse ?- Nicotine patch ? ?Essential hypertension, benign ?- Hold blood pressure medications since blood pressure is soft ? ?Hypokalemia ?Potassium still low at 3.3 but improved than before.  Magnesium now within normal limit. ?-Repleted potassium and monitor ?-Continue to monitor ? ?Anxiety ?- As needed Ativan ? ?Breast cancer ([place]) ?Patient is receiving chemotherapy.  Last dose was last week.  Patient is following up with Dr. Tasia Catchings. ?-Consulted Dr. Tasia Catchings ? ? ?Subjective: Patient was feeling weak and having some nausea, no vomiting.  She does not think that she is well enough to go home today.  A lot of anxiety ? ?Physical Exam: ?Vitals:  ? 07/18/21 0013 07/18/21 0612 07/18/21 0723 07/18/21 1156  ?BP: 94/61 121/76 136/83 112/65  ?Pulse: 83 91 90 89  ?  Resp: '12 16 17 17  '$ ?Temp: 98.7 ?F (37.1 ?C) 98 ?F (36.7 ?C) 98 ?F (36.7 ?C) 98.1 ?F (36.7 ?C)  ?TempSrc: Oral Oral    ?SpO2: 98%  99% 99%  ?Weight:      ?Height:      ? ?General.  Frail lady, in no acute distress. ?Pulmonary.  Lungs clear bilaterally, normal  respiratory effort. ?CV.  Regular rate and rhythm, no JVD, rub or murmur. ?Abdomen.  Soft, nontender, nondistended, BS positive. ?CNS.  Alert and oriented .  No focal neurologic deficit. ?Extremities.  No edema, no cyanosis, pulses intact and symmetrical. ?Psychiatry.  Judgment and insight appears normal. ? ?Data Reviewed: ?Prior notes and labs reviewed ? ?Family Communication:  ? ?Disposition: ?Status is: Inpatient ?Remains inpatient appropriate because: Severity of illness ? ? Planned Discharge Destination: Home ? ?Time spent: 45 minutes  ? ?This record has been created using Systems analyst. Errors have been sought and corrected,but may not always be located. Such creation errors do not reflect on the standard of care. ? ?Author: ?Lorella Nimrod, MD ?07/18/2021 3:23 PM ? ?For on call review [email or web address].  ?

## 2021-07-18 NOTE — Progress Notes (Signed)
? ?Hematology/Oncology Progress note ?Telephone:(336) [phone] [phone] ?  ? ? ?Patient Care Team: ?[name], NP as PCP - General (Nurse Practitioner)  ? ?Name of the patient: Yvonne Scott  ?[id number]  ?Jul 14, 1960  ?Date of visit: 07/18/21 ? ? ?INTERVAL HISTORY-  ?Cough is better.  This morning she feels nauseous and threw up.  Feeling tired. ?Afebrile. ?Review of systems-  ? ?No Known Allergies ? ?Patient Active Problem List  ? Diagnosis Date Noted  ? Neutropenic fever ([place])   ? HCAP (healthcare-associated pneumonia) 07/16/2021  ? Sepsis ([place]) 07/16/2021  ? Breast cancer ([place])   ? Pancytopenia ([place])   ? Port-A-Cath in place 07/07/2021  ? Anxiety associated with cancer diagnosis ([place]) 07/07/2021  ? Chemotherapy-induced neuropathy ([place]) 06/08/2021  ? Anxiety 06/08/2021  ? Hypomagnesemia 04/13/2021  ? Generalized body aches 03/11/2021  ? Neoplasm related pain 03/11/2021  ? Encounter for antineoplastic chemotherapy 03/11/2021  ? Encounter for monitoring cardiotoxic drug therapy 02/27/2021  ? Invasive ductal carcinoma of right breast ([place]) 02/20/2021  ? Goals of care, counseling/discussion 02/20/2021  ? Metabolic acidosis 99/24/2683  ? DKA (diabetic ketoacidosis) ([place]) 02/21/2020  ? Elevated LFTs 02/21/2020  ? AKI (acute kidney injury) ([place]) 02/21/2020  ? Leukocytosis 02/21/2020  ? Lump of right breast 02/21/2020  ? Hyponatremia 01/30/2017  ? Hypochloremia 01/30/2017  ? Hypokalemia 01/27/2017  ? Hyperglycemia 01/27/2017  ? Back pain 12/15/2016  ? Chronic lower back pain 12/15/2016  ? Elevated ferritin 11/07/2016  ? Aortic atherosclerosis ([place]) 10/24/2016  ? Fatty liver 10/24/2016  ? Medication monitoring encounter 11/19/2015  ? GERD (gastroesophageal reflux disease) 11/19/2015  ? Tobacco abuse 11/19/2015  ? Essential hypertension, benign 11/19/2015  ? ? ? ? ?Current Facility-Administered Medications:  ?  0.9 %  sodium chloride infusion, , Intravenous, Continuous, Ivor Costa, MD, Last Rate: 125  mL/hr at 07/18/21 0357, Infusion Verify at 07/18/21 0357 ?  acetaminophen (TYLENOL) tablet 650 mg, 650 mg, Oral, Q6H PRN, Ivor Costa, MD ?  albuterol (PROVENTIL) (2.5 MG/3ML) 0.083% nebulizer solution 3 mL, 3 mL, Inhalation, Q4H PRN, Ivor Costa, MD ?  calcium-vitamin D (OSCAL WITH D) 500-5 MG-MCG per tablet 2 tablet, 2 tablet, Oral, Daily, Ivor Costa, MD, 2 tablet at 07/18/21 1025 ?  ceFEPIme (MAXIPIME) 2 g in sodium chloride 0.9 % 100 mL IVPB, 2 g, Intravenous, Q12H, Beers, Shanon Brow, RPH, Last Rate: 200 mL/hr at 07/18/21 0617, 2 g at 07/18/21 0617 ?  Chlorhexidine Gluconate Cloth 2 % PADS 6 each, 6 each, Topical, Daily, Lorella Nimrod, MD, 6 each at 07/17/21 1647 ?  dextromethorphan-guaiFENesin ([place] DM) 30-600 MG per 12 hr tablet 1 tablet, 1 tablet, Oral, BID PRN, Ivor Costa, MD ?  diphenoxylate-atropine (LOMOTIL) 2.5-0.025 MG per tablet 1 tablet, 1 tablet, Oral, QID PRN, Ivor Costa, MD ?  folic acid (FOLVITE) tablet 1 mg, 1 mg, Oral, Daily, Ivor Costa, MD, 1 mg at 07/18/21 1026 ?  gabapentin (NEURONTIN) capsule 100 mg, 100 mg, Oral, TID, Ivor Costa, MD, 100 mg at 07/18/21 1028 ?  LORazepam (ATIVAN) tablet 0.5 mg, 0.5 mg, Oral, Q12H PRN, Ivor Costa, MD, 0.5 mg at 07/17/21 2120 ?  magnesium chloride (SLOW-MAG) 64 MG SR tablet 64 mg, 1 tablet, Oral, BID, Ivor Costa, MD, 64 mg at 07/18/21 1028 ?  multivitamin with minerals tablet 1 tablet, 1 tablet, Oral, Daily, Ivor Costa, MD, 1 tablet at 07/18/21 1025 ?  mupirocin ointment (BACTROBAN) 2 % 1 application., 1 application., Nasal, BID, Lorella Nimrod, MD, 1 application. at 07/18/21 1028 ?  nicotine (NICODERM CQ - dosed in mg/24 hours) patch 21 mg, 21 mg, Transdermal, Daily, Ivor Costa, MD, 21 mg at 07/18/21 1027 ?  ondansetron (ZOFRAN) injection 4 mg, 4 mg, Intravenous, Q8H PRN, Ivor Costa, MD, 4 mg at 07/18/21 1024 ?  oxyCODONE-acetaminophen (PERCOCET/ROXICET) 5-325 MG per tablet 1 tablet, 1 tablet, Oral, Q4H PRN, Ivor Costa, MD, 1 tablet at 07/18/21 1024 ?   pantoprazole (PROTONIX) EC tablet 40 mg, 40 mg, Oral, Daily, Ivor Costa, MD, 40 mg at 07/18/21 1025 ?  thiamine tablet 50 mg, 50 mg, Oral, Daily, Ivor Costa, MD, 50 mg at 07/18/21 1026 ? ? ?Physical exam:  ?Vitals:  ? 07/17/21 2109 07/18/21 0013 07/18/21 2353 07/18/21 6144  ?BP: 101/65 94/61 121/76 136/83  ?Pulse: 84 83 91 90  ?Resp: '15 12 16 17  '$ ?Temp: 98 ?F (36.7 ?C) 98.7 ?F (37.1 ?C) 98 ?F (36.7 ?C) 98 ?F (36.7 ?C)  ?TempSrc: Oral Oral Oral   ?SpO2: 99% 98%  99%  ?Weight:      ?Height:      ? ?Physical Exam ?Constitutional:   ?   General: She is not in acute distress. ?   Appearance: She is not diaphoretic.  ?HENT:  ?   Head: Normocephalic and atraumatic.  ?   Nose: Nose normal.  ?   Mouth/Throat:  ?   Pharynx: No oropharyngeal exudate.  ?Eyes:  ?   General: No scleral icterus. ?   Pupils: Pupils are equal, round, and reactive to light.  ?Cardiovascular:  ?   Rate and Rhythm: Normal rate and regular rhythm.  ?   Heart sounds: No murmur heard. ?Pulmonary:  ?   Effort: Pulmonary effort is normal. No respiratory distress.  ?Abdominal:  ?   General: There is no distension.  ?   Palpations: Abdomen is soft.  ?   Tenderness: There is no abdominal tenderness.  ?Musculoskeletal:     ?   General: Normal range of motion.  ?   Cervical back: Normal range of motion and neck supple.  ?Skin: ?   General: Skin is warm and dry.  ?   Findings: No erythema.  ?Neurological:  ?   Mental Status: She is alert and oriented to person, place, and time.  ?   Cranial Nerves: No cranial nerve deficit.  ?   Motor: No abnormal muscle tone.  ?   Coordination: Coordination normal.  ?Psychiatric:     ?   Mood and Affect: Affect normal.  ?   Comments: Anxious  ?  ? ? ? ? ?  Latest Ref Rng & Units 07/18/2021  ?  4:07 AM  ?CMP  ?Glucose 70 - 99 mg/dL 106    ?BUN 8 - 23 mg/dL <5    ?Creatinine 0.44 - 1.00 mg/dL 0.50    ?Sodium 135 - 145 mmol/L 139    ?Potassium 3.5 - 5.1 mmol/L 3.3    ?Chloride 98 - 111 mmol/L 113    ?CO2 22 - 32 mmol/L 19    ?Calcium  8.9 - 10.3 mg/dL 7.6    ? ? ?  Latest Ref Rng & Units 07/18/2021  ?  4:07 AM  ?CBC  ?WBC 4.0 - 10.5 K/uL 5.4    ?Hemoglobin 12.0 - 15.0 g/dL 8.0    ?Hematocrit 36.0 - 46.0 % 24.5    ?Platelets 150 - 400 K/uL 31    ? ? ?RADIOGRAPHIC STUDIES: ?I have personally reviewed the radiological images as listed and agreed with the findings  in the report. ?DG Chest 2 View ? ?Result Date: 07/16/2021 ?CLINICAL DATA:  Shortness of breath.  Cough. EXAM: CHEST - 2 VIEW COMPARISON:  Jul 14, 2021. FINDINGS: The heart size and mediastinal contours are within normal limits. Stable position of left internal jugular Port-A-Cath. Hyperexpansion of the lungs is noted. Right lung is clear. Minimal left midlung subsegmental atelectasis is noted. The visualized skeletal structures are unremarkable. IMPRESSION: Hyperexpansion of the lungs is noted. Minimal left midlung subsegmental atelectasis is noted. Electronically Signed   By: Marijo Conception M.D.   On: 07/16/2021 12:18  ? ?DG Chest 2 View ? ?Result Date: 07/14/2021 ?CLINICAL DATA:  Chest congestion history of breast carcinoma EXAM: CHEST - 2 VIEW COMPARISON:  04/29/2021 FINDINGS: Cardiac size is within normal limits. Increase in AP diameter of chest suggests COPD. Lung fields are clear of any infiltrates or pulmonary edema. There is no pleural effusion or pneumothorax. Tip of left IJ chest port is seen in the superior vena cava close to the right atrium. IMPRESSION: No active cardiopulmonary disease. Electronically Signed   By: Elmer Picker M.D.   On: 07/14/2021 14:43  ? ?CT Angio Chest PE W and/or Wo Contrast ? ?Result Date: 07/16/2021 ?CLINICAL DATA:  Pulmonary embolism (PE) suspected, positive D-dimer EXAM: CT ANGIOGRAPHY CHEST WITH CONTRAST TECHNIQUE: Multidetector CT imaging of the chest was performed using the standard protocol during bolus administration of intravenous contrast. Multiplanar CT image reconstructions and MIPs were obtained to evaluate the vascular anatomy. RADIATION  DOSE REDUCTION: This exam was performed according to the departmental dose-optimization program which includes automated exposure control, adjustment of the mA and/or kV according to patient size and/or use of it

## 2021-07-18 NOTE — Progress Notes (Signed)
Patient stable. No s/s of distress noted. Patient yellow mews prior. Continue VS every 4 hours and provide PRN meds as appropriate.  ? 07/18/21 0013  ?Assess: MEWS Score  ?Temp 98.7 ?F (37.1 ?C)  ?BP 94/61  ?Pulse Rate 83  ?ECG Heart Rate 79  ?Resp 12  ?SpO2 98 %  ?O2 Device Room Air  ?Patient Activity (if Appropriate) In bed  ?Assess: MEWS Score  ?MEWS Temp 0  ?MEWS Systolic 1  ?MEWS Pulse 0  ?MEWS RR 1  ?MEWS LOC 0  ?MEWS Score 2  ?MEWS Score Color Yellow  ?Assess: if the MEWS score is Yellow or Red  ?Were vital signs taken at a resting state? Yes  ?Focused Assessment No change from prior assessment  ?MEWS guidelines implemented *See Row Information* No, previously yellow, continue vital signs every 4 hours  ?Treat  ?MEWS Interventions Other (Comment) ?(None Yellow [place] previously)  ?Pain Scale 0-10  ?Pain Score 4  ?Take Vital Signs  ?Increase Vital Sign Frequency   ?(Cont Q4 VS)  ?Escalate  ?MEWS: Escalate  ?(NO, Yellow mews prior. Continue Q4 VS)  ?Document  ?Patient Outcome Other (Comment) ?(stable)  ?Progress note created (see row info) Yes  ?Assess: SIRS CRITERIA  ?SIRS Temperature  0  ?SIRS Pulse 0  ?SIRS Respirations  0  ?SIRS WBC 0  ?SIRS Score Sum  0  ? ? ?

## 2021-07-19 DIAGNOSIS — J189 Pneumonia, unspecified organism: Secondary | ICD-10-CM | POA: Diagnosis not present

## 2021-07-19 LAB — BASIC METABOLIC PANEL
Anion gap: 7 (ref 5–15)
BUN: <5 mg/dL — ABNORMAL LOW (ref 8–23)
CO2: 19 mmol/L — ABNORMAL LOW (ref 22–32)
Calcium: 7.9 mg/dL — ABNORMAL LOW (ref 8.9–10.3)
Chloride: 109 mmol/L (ref 98–111)
Creatinine, Ser: 0.49 mg/dL (ref 0.44–1.00)
GFR, Estimated: >60 mL/min (ref >60)
Glucose, Bld: 80 mg/dL (ref 70–99)
Potassium: 2.9 mmol/L — ABNORMAL LOW (ref 3.5–5.1)
Sodium: 135 mmol/L (ref 135–145)

## 2021-07-19 LAB — CBC
HCT: 25 % — ABNORMAL LOW (ref 36.0–46.0)
Hemoglobin: 8.4 g/dL — ABNORMAL LOW (ref 12.0–15.0)
MCH: 34.3 pg — ABNORMAL HIGH (ref 26.0–34.0)
MCHC: 33.6 g/dL (ref 30.0–36.0)
MCV: 102 fL — ABNORMAL HIGH (ref 80.0–100.0)
Platelets: 40 10*3/uL — ABNORMAL LOW (ref 150–400)
RBC: 2.45 MIL/uL — ABNORMAL LOW (ref 3.87–5.11)
RDW: 14.9 % (ref 11.5–15.5)
WBC: 12.6 10*3/uL — ABNORMAL HIGH (ref 4.0–10.5)
nRBC: 0 % (ref 0.0–0.2)

## 2021-07-19 LAB — HIV ANTIBODY (ROUTINE TESTING W REFLEX): HIV Screen 4th Generation wRfx: NONREACTIVE

## 2021-07-19 MED ORDER — HEPARIN SOD (PORK) LOCK FLUSH 10 UNIT/ML IV SOLN
10.0000 [IU] | Freq: Once | INTRAVENOUS | Status: AC
  Administered 2021-07-19: 10 [IU]
  Filled 2021-07-19: qty 1

## 2021-07-19 MED ORDER — DM-GUAIFENESIN ER 30-600 MG PO TB12: 1.0000 | ORAL_TABLET | Freq: Two times a day (BID) | ORAL | 1 refills | Status: DC | PRN

## 2021-07-19 MED ORDER — POTASSIUM CHLORIDE CRYS ER 20 MEQ PO TBCR
40.0000 meq | EXTENDED_RELEASE_TABLET | ORAL | Status: DC
  Administered 2021-07-19 (×2): 40 meq via ORAL
  Filled 2021-07-19 (×2): qty 2

## 2021-07-19 MED ORDER — MAGNESIUM SULFATE 2 GM/50ML IV SOLN
2.0000 g | Freq: Once | INTRAVENOUS | Status: AC
  Administered 2021-07-19: 2 g via INTRAVENOUS
  Filled 2021-07-19: qty 50

## 2021-07-19 NOTE — Discharge Summary (Signed)
?Physician Discharge Summary ?  ?Patient: Yvonne Scott MRN: [id number] DOB: 1960/04/17  ?Admit date:     07/16/2021  ?Discharge date: 07/19/21  ?Discharge Physician: Lorella Nimrod  ? ?PCP: [name], NP  ? ?Recommendations at discharge:  ?Please obtain CBC and BMP within a week. ?Please ensure that she complete the course of Levaquin. ?Follow-up with oncology within a week ? ?Discharge Diagnoses: ?Principal Problem: ?  HCAP (healthcare-associated pneumonia) ?Active Problems: ?  Sepsis ([facility]) ?  Pancytopenia ([place]) ?  GERD (gastroesophageal reflux disease) ?  Tobacco abuse ?  Essential hypertension, benign ?  Hypokalemia ?  Anxiety ?  Breast cancer ([facility]) ?  Neutropenic fever ([place]) ? ? ?Hospital Course: ?Taken from H&P. ? ?Yvonne Scott is a 61 y.o. female with medical history significant of breast cancer on chemotherapy, tobacco abuse, hypertension, GERD, anxiety, who presents with cough and shortness of breath. ?  ?Pt states that she has cough and mild shortness of breath for more than 2 days.  She coughs up clear mucus.  She also has some frontal chest pain, which is pleuritic, mild to moderate, nonradiating, aggravated by deep breath.  Patient has chills, but no fever.  Complains of whole body aches.  Denies nausea vomiting, diarrhea or abdominal pain.  No symptoms of UTI.  Patient was started on Levaquin 2 days ago without significant help. ?  ?pt was found to have pancytopenia with WBC 1.0, hemoglobin 9.3, platelets 55, troponin level 4, positive D-dimer 0.63, potassium 3.2, renal function okay, lactic acid 0.9, temperature normal, soft blood pressure, heart rate 108, RR 23, oxygen saturation 94% on room air.  CT angiogram negative for PE, does showed possible right upper lobe pneumonia. ?Procalcitonin negative.  Cultures pending ? ?Last chemotherapy was ddAC + keytruda on 07/07/21. Patient received GCSF on Day 3.  ?Patient has an history of developing cytopenias after the treatment. ? ?Oncology was  consulted-there is no need for another GCSF at this time. ? ?Bilateral lower extremity venous Doppler was negative for DVT. ? ?MRSA swab was positive for Staph aureus, no methicillin resistance. ? ?Patient was started on cefepime and vancomycin. ? ?5/7: Patient remained afebrile, leukopenia improved, slight worsening of hemoglobin and thrombocytopenia.  Vancomycin was discontinued.  Initial plan was to discharge on Levaquin and oncology will follow her up as outpatient. ?Patient was complaining of some nausea and not feeling well and does not think that she is safe to go home today. ? ?5/8: Patient remained stable.  Able to tolerate meal.  Still feeling weak.  She is being discharged on Levaquin which she got from her oncologist. ?Cell counts improving. ?Patient need to have a close follow-up with oncology for further recommendations. ? ?Assessment and Plan: ?* HCAP (healthcare-associated pneumonia) ?Sepsis due to HCAP: pt has sepsis with neutropenia, heart rate 108, RR 23.  Lactic acid normal 0.9.  Blood pressure soft.  Currently hemodynamically stable ?Procalcitonin negative.  Checking respiratory viral panel.  MRSA PCR positive for Staph aureus, no resistance.  Cultures pending.  Strep pneumo negative, Legionella pending ?Vancomycin was discontinued ?-continue IV  cefepime ?- Mucinex for cough  ?- Bronchodilators ? ? ?Pancytopenia ([place]) ?Patient has anemia and thrombocytopenia now,: Leukopenia improved.  Likely due to chemotherapy ?-Protection precaution.  Received GCSF on third day of her last chemo. ?No need to repeat the dose per oncology. ?-Follow-up with CBC ? ?Sepsis ([place]) ?See above ? ?GERD (gastroesophageal reflux disease) ?- Continue Protonix ?-Added some Tums for indigestion ? ?Tobacco abuse ?-  Nicotine patch ? ?Essential hypertension, benign ?- Hold blood pressure medications since blood pressure is soft ? ?Hypokalemia ?Potassium still low at 3.3 but improved than before.  Magnesium now within normal  limit. ?-Repleted potassium and monitor ?-Continue to monitor ? ?Anxiety ?- As needed Ativan ? ?Breast cancer ([place]) ?Patient is receiving chemotherapy.  Last dose was last week.  Patient is following up with Dr. Tasia Catchings. ?-Consulted Dr. Tasia Catchings ? ? ?Consultants: Oncology ?Procedures performed: None ?Disposition: Home ?Diet recommendation:  ?Discharge Diet Orders (From admission, onward)  ? ?  Start     Ordered  ? 07/19/21 0000  Diet - low sodium heart healthy       ? 07/19/21 1242  ? ?  ?  ? ?  ? ?Cardiac diet ?DISCHARGE MEDICATION: ?Allergies as of 07/19/2021   ?No Known Allergies ?  ? ?  ?Medication List  ?  ? ?STOP taking these medications   ? ?guaiFENesin 600 MG 12 hr tablet ?Commonly known as: [place] ?  ? ?  ? ?TAKE these medications   ? ?calcium-vitamin D 500-5 MG-MCG tablet ?Commonly known as: OSCAL WITH D ?Take 2 tablets by mouth daily. ?  ?dexamethasone 4 MG tablet ?Commonly known as: DECADRON ?TAKE 2 TABLETS BY MOUTH ONCE DAILY FOR 2 DAYS AFTER  CHEMOTHERAPY.  TAKE  WITH  FOOD. ?  ?dextromethorphan-guaiFENesin 30-600 MG 12hr tablet ?Commonly known as: [place] DM ?Take 1 tablet by mouth 2 (two) times daily as needed for cough. ?  ?diphenoxylate-atropine 2.5-0.025 MG tablet ?Commonly known as: LOMOTIL ?Take 1 tablet by mouth 4 (four) times daily as needed for diarrhea or loose stools. ?  ?fluconazole 150 MG tablet ?Commonly known as: DIFLUCAN ?Take 1 tablet (150 mg total) by mouth See admin instructions. Take one tablet, and repeat the second dose in 3 days. ?  ?folic acid 1 MG tablet ?Commonly known as: FOLVITE ?Take 1 tablet (1 mg total) by mouth daily. ?  ?gabapentin 100 MG capsule ?Commonly known as: Neurontin ?Take 1 capsule (100 mg total) by mouth 3 (three) times daily. ?  ?ibuprofen 200 MG tablet ?Commonly known as: ADVIL ?Take 200 mg by mouth every 6 (six) hours as needed for headache. ?  ?levofloxacin 750 MG tablet ?Commonly known as: Levaquin ?Take 1 tablet (750 mg total) by mouth daily for 7 days. ?   ?lidocaine-prilocaine cream ?Commonly known as: EMLA ?Apply to affected area once ?  ?loperamide 2 MG capsule ?Commonly known as: IMODIUM ?Take 1 capsule (2 mg total) by mouth See admin instructions. Initial: 4 mg, followed by 2 mg after each loose stool; maximum: 16 mg/day ?  ?LORazepam 0.5 MG tablet ?Commonly known as: ATIVAN ?Take 1 tablet (0.5 mg total) by mouth every 12 (twelve) hours as needed for anxiety (nausea). ?  ?magic mouthwash (multi-ingredient) oral suspension ?Take 5 mLs by mouth 4 (four) times daily as needed for mouth pain. ?  ?magnesium chloride 64 MG Tbec SR tablet ?Commonly known as: SLOW-MAG ?Take 1 tablet (64 mg total) by mouth 2 (two) times daily. ?  ?multivitamin with minerals Tabs tablet ?Take 1 tablet by mouth daily. ?  ?omeprazole 20 MG capsule ?Commonly known as: PRILOSEC ?Take 1 capsule (20 mg total) by mouth 2 (two) times daily before a meal. ?  ?ondansetron 8 MG tablet ?Commonly known as: ZOFRAN ?TAKE 1 TABLET BY MOUTH TWICE DAILY AS NEEDED. START THE THIRD DAY AFTER CARBOPLATIN AND AC CHEMOTHERAPY. ?  ?potassium chloride SA 20 MEQ tablet ?Commonly known as: KLOR-CON M ?  Take 1 tablet (20 mEq total) by mouth 2 (two) times daily. ?  ?prochlorperazine 10 MG tablet ?Commonly known as: COMPAZINE ?Take 1 tablet (10 mg total) by mouth every 6 (six) hours as needed (Nausea or vomiting). ?  ?thiamine 100 MG tablet ?Take 0.5 tablets (50 mg total) by mouth daily. ?  ?Ventolin HFA 108 (90 Base) MCG/ACT inhaler ?Generic drug: albuterol ?Inhale 2 puffs into the lungs 4 (four) times daily as needed. ?  ? ?  ? ? Follow-up Information   ? ? [name], NP. Schedule an appointment as soon as possible for a visit in 1 week(s).   ?Specialty: Nurse Practitioner ?Contact information: ?[address] ?[place] [address] ?[phone] ? ? ?  ?  ? ? Earlie Server, MD. Schedule an appointment as soon as possible for a visit in 1 week(s).   ?Specialty: Oncology ?Contact information: ?[place] [address] ?[phone] ? ? ?  ?  ? ?  ?  ? ?  ? ?Discharge Exam: ?Filed Weights  ? 07/16/21 1136 07/17/21 1345 07/19/21 0532  ?Weight: 43.1 kg 46.9 kg 48.3 kg  ? ?General.     In no acute distress. ?P

## 2021-07-19 NOTE — Progress Notes (Signed)
Initial Nutrition Assessment ? ?DOCUMENTATION CODES:  ? ?Not applicable ? ?INTERVENTION:  ? ?-Boost Breeze po TID, each supplement provides 250 kcal and 9 grams of protein  ?-MVI with minerals daily ? ?NUTRITION DIAGNOSIS:  ? ?Increased nutrient needs related to cancer and cancer related treatments as evidenced by estimated needs. ? ?GOAL:  ? ?Patient will meet greater than or equal to 90% of their needs ? ?MONITOR:  ? ?PO intake, Supplement acceptance ? ?REASON FOR ASSESSMENT:  ? ?Malnutrition Screening Tool ?  ? ?ASSESSMENT:  ? ?Yvonne Scott is a 61 y.o. female with medical history significant of breast cancer on chemotherapy, tobacco abuse, hypertension, GERD, anxiety, who presents with cough and shortness of breath. ? ?Pt admitted with HCAP.  ? ?Reviewed I/O's: +1.9 L x 24 hours and +3.2 L since admission ? ?UOP: 900 ml x 24 hours ? ?Spoke with pt at bedside, who reports improved oral intake since admission. Per pt, she has been unable to keep foods or liquids down for 3-4 days PTA. Pt shares that she often has spells where her appetite is good for a few days and poor for a few days. When her appetite is poor, she is unable to eat anything. During good days, pt consumes 2 smaller meals and a large dinner meal. SHe tries to maximize her intake at dinner, as this is when she is the most hungry. Noted meal completions 50-90%  ? ?Pt shares her UBW is around 100#. She estimates she has lost 10 pounds over the past week. Reviewed wt hx; wt has been stable over the past month.  ? ?Discussed importance of good meal and supplement intake to promote healing. Pt does not like Ensure supplements but willing to try Boost Breeze.  ? ?Medications reviewed and include calcium-vitamin D, folic acid, magnesium chloride, potassium chloride, thiamine, and 0.9% sodium chloride infusion @ 125 ml/hr.  ? ?Labs reviewed: K: 2.9, CBGS: 86.  ? ?NUTRITION - FOCUSED PHYSICAL EXAM: ? ?Flowsheet Row Most Recent Value  ?Orbital Region No  depletion  ?Upper Arm Region No depletion  ?Thoracic and Lumbar Region No depletion  ?Buccal Region No depletion  ?Temple Region No depletion  ?Clavicle Bone Region No depletion  ?Clavicle and Acromion Bone Region No depletion  ?Scapular Bone Region No depletion  ?Dorsal Hand No depletion  ?Patellar Region No depletion  ?Anterior Thigh Region No depletion  ?Posterior Calf Region No depletion  ?Edema (RD Assessment) None  ?Hair Reviewed  ?Eyes Reviewed  ?Mouth Reviewed  ?Skin Reviewed  ?Nails Reviewed  ? ?  ? ? ?Diet Order:   ?Diet Order   ? ?       ?  Diet - low sodium heart healthy       ?  ?  Diet regular Room service appropriate? Yes; Fluid consistency: Thin  Diet effective now       ?  ? ?  ?  ? ?  ? ? ?EDUCATION NEEDS:  ? ?Education needs have been addressed ? ?Skin:  Skin Assessment: Reviewed RN Assessment ? ?Last BM:  07/18/21 ? ?Height:  ? ?Ht Readings from Last 1 Encounters:  ?07/17/21 '5\' 2"'$  (1.575 m)  ? ? ?Weight:  ? ?Wt Readings from Last 1 Encounters:  ?07/19/21 48.3 kg  ? ? ?Ideal Body Weight:  50 kg ? ?BMI:  Body mass index is 19.48 kg/m?. ? ?Estimated Nutritional Needs:  ? ?Kcal:  1450-1650 ? ?Protein:  70-85 grams ? ?Fluid:  > 1.4 L ? ? ? ?  Loistine Chance, RD, LDN, CDCES ?Registered Dietitian II ?Certified Diabetes Care and Education Specialist ?Please refer to [facility] for RD and/or RD on-call/weekend/after hours pager  ?

## 2021-07-19 NOTE — TOC CM/SW Note (Signed)
Patient has orders to discharge home today. Chart reviewed. PCP is Evern Bio, NP. On room air. No wound care needs. No TOC needs identified. CSW signing off. ? ?[place] Scrape, [place] ?[phone] ? ?

## 2021-07-20 ENCOUNTER — Other Ambulatory Visit: Payer: Self-pay

## 2021-07-20 ENCOUNTER — Other Ambulatory Visit: Payer: Self-pay | Admitting: Oncology

## 2021-07-20 ENCOUNTER — Encounter: Payer: Self-pay | Admitting: Oncology

## 2021-07-20 DIAGNOSIS — C50911 Malignant neoplasm of unspecified site of right female breast: Secondary | ICD-10-CM

## 2021-07-20 LAB — LEGIONELLA PNEUMOPHILA SEROGP 1 UR AG: L. pneumophila Serogp 1 Ur Ag: NEGATIVE

## 2021-07-22 ENCOUNTER — Inpatient Hospital Stay: Payer: Medicaid Other

## 2021-07-22 ENCOUNTER — Encounter: Payer: Self-pay | Admitting: Medical Oncology

## 2021-07-22 ENCOUNTER — Inpatient Hospital Stay (HOSPITAL_BASED_OUTPATIENT_CLINIC_OR_DEPARTMENT_OTHER): Payer: Medicaid Other | Admitting: Nurse Practitioner

## 2021-07-22 ENCOUNTER — Encounter: Payer: Self-pay | Admitting: Oncology

## 2021-07-22 VITALS — BP 93/64 | HR 91 | Temp 98.8°F | Resp 14 | Wt 101.6 lb

## 2021-07-22 DIAGNOSIS — Z5112 Encounter for antineoplastic immunotherapy: Secondary | ICD-10-CM | POA: Diagnosis not present

## 2021-07-22 DIAGNOSIS — J189 Pneumonia, unspecified organism: Secondary | ICD-10-CM | POA: Diagnosis not present

## 2021-07-22 DIAGNOSIS — C50911 Malignant neoplasm of unspecified site of right female breast: Secondary | ICD-10-CM

## 2021-07-22 DIAGNOSIS — E860 Dehydration: Secondary | ICD-10-CM

## 2021-07-22 LAB — CBC WITH DIFFERENTIAL/PLATELET
Abs Immature Granulocytes: 2.79 10*3/uL — ABNORMAL HIGH (ref 0.00–0.07)
Basophils Absolute: 0.1 10*3/uL (ref 0.0–0.1)
Basophils Relative: 1 %
Eosinophils Absolute: 0 10*3/uL (ref 0.0–0.5)
Eosinophils Relative: 0 %
HCT: 26 % — ABNORMAL LOW (ref 36.0–46.0)
Hemoglobin: 8.8 g/dL — ABNORMAL LOW (ref 12.0–15.0)
Immature Granulocytes: 13 %
Lymphocytes Relative: 17 %
Lymphs Abs: 3.5 10*3/uL (ref 0.7–4.0)
MCH: 35.3 pg — ABNORMAL HIGH (ref 26.0–34.0)
MCHC: 33.8 g/dL (ref 30.0–36.0)
MCV: 104.4 fL — ABNORMAL HIGH (ref 80.0–100.0)
Monocytes Absolute: 1.8 10*3/uL — ABNORMAL HIGH (ref 0.1–1.0)
Monocytes Relative: 9 %
Neutro Abs: 12.9 10*3/uL — ABNORMAL HIGH (ref 1.7–7.7)
Neutrophils Relative %: 60 %
Platelets: 261 10*3/uL (ref 150–400)
RBC: 2.49 MIL/uL — ABNORMAL LOW (ref 3.87–5.11)
RDW: 15.7 % — ABNORMAL HIGH (ref 11.5–15.5)
Smear Review: NORMAL
WBC: 21.1 10*3/uL — ABNORMAL HIGH (ref 4.0–10.5)
nRBC: 0.2 % (ref 0.0–0.2)

## 2021-07-22 LAB — COMPREHENSIVE METABOLIC PANEL
ALT: 8 U/L (ref 0–44)
AST: 14 U/L — ABNORMAL LOW (ref 15–41)
Albumin: 3.3 g/dL — ABNORMAL LOW (ref 3.5–5.0)
Alkaline Phosphatase: 97 U/L (ref 38–126)
Anion gap: 5 (ref 5–15)
BUN: 6 mg/dL — ABNORMAL LOW (ref 8–23)
CO2: 23 mmol/L (ref 22–32)
Calcium: 8.6 mg/dL — ABNORMAL LOW (ref 8.9–10.3)
Chloride: 105 mmol/L (ref 98–111)
Creatinine, Ser: 0.94 mg/dL (ref 0.44–1.00)
GFR, Estimated: >60 mL/min (ref >60)
Glucose, Bld: 89 mg/dL (ref 70–99)
Potassium: 4 mmol/L (ref 3.5–5.1)
Sodium: 133 mmol/L — ABNORMAL LOW (ref 135–145)
Total Bilirubin: 0.3 mg/dL (ref 0.3–1.2)
Total Protein: 6.4 g/dL — ABNORMAL LOW (ref 6.5–8.1)

## 2021-07-22 LAB — MAGNESIUM: Magnesium: 1.3 mg/dL — ABNORMAL LOW (ref 1.7–2.4)

## 2021-07-22 MED ORDER — SODIUM CHLORIDE 0.9 % IV SOLN
Freq: Once | INTRAVENOUS | Status: AC
  Filled 2021-07-22: qty 250

## 2021-07-22 MED ORDER — HEPARIN SOD (PORK) LOCK FLUSH 100 UNIT/ML IV SOLN
500.0000 [IU] | Freq: Once | INTRAVENOUS | Status: AC
  Administered 2021-07-22: 500 [IU] via INTRAVENOUS
  Filled 2021-07-22: qty 5

## 2021-07-22 MED ORDER — SODIUM CHLORIDE 0.9% FLUSH
10.0000 mL | Freq: Once | INTRAVENOUS | Status: AC
  Administered 2021-07-22: 10 mL via INTRAVENOUS
  Filled 2021-07-22: qty 10

## 2021-07-22 MED ORDER — MAGNESIUM SULFATE 2 GM/50ML IV SOLN
2.0000 g | Freq: Once | INTRAVENOUS | Status: AC
  Administered 2021-07-22: 2 g via INTRAVENOUS
  Filled 2021-07-22: qty 50

## 2021-07-22 NOTE — Progress Notes (Unsigned)
?Hematology/Oncology Progress Note ?[phone] [phone] ?  ?Patient Care Team: ?[name], NP as PCP - General (Nurse Practitioner) ? ?REFERRING PROVIDER: ?[name], NP  ? ?CHIEF COMPLAINTS/REASON FOR VISIT:  ?Follow-up for triple negative right breast cancer ? ?HISTORY OF PRESENTING ILLNESS:  ?Yvonne Scott is a  61 y.o.  female with PMH listed below was seen in consultation at the request of  [name], NP  for evaluation of breast cancer. ? ?August 2022, patient was diagnosed with right breast stage IIIb cT3 N0M0, grade 2, ER negative, PR negative HER2 negative breast cancer.  Patient self palpated breast mass many years ago.  10/21/2020 diagnostic mammogram and ultrasound confirmed presence of mass and a subsequent biopsy revealed right breast triple negative cancer. ?There was plan for patient to start with neoadjuvant chemotherapy followed by surgery and radiation.  Unfortunately patient never started treatment.  Patient was accompanied by her mother.  They want to transfer her oncology care to locally. ? ?Patient reports having right breast pain as well as generalized pain.  Patient had a [name] placed 3 weeks ago and she reports some swelling around the sites.  Patient has had negative genetic testing at [facility]. ? ?Patient has a history of alcohol dependence.  Per patient she has not been drinking recently.  Everyday smoker. ? ?02/24/2021, CT chest abdomen pelvis showed large right breast mass, 5.5 x 4.8 cm with small right axillary lymph node with variable degrees of enhancement potentially involved but not specific based on size.  This tracked down to retropectoral nodal stations largest lymph node along the lateral margin of the pectoralis major.  Tiny pulmonary nodules in the chest as discussed.  These are nonspecific but would consider short interval follow-up to assess for any changes.  No evidence of metastatic disease involving the abdomen or pelvis.   Aortic atherosclerosis. ? ?02/25/2021, echocardiogram showed LVEF 65 to 70%.  Patient has been to chemotherapy class and understands antiemetics instructions. ? ?03/16/2021, bone scan showed no evidence of osseous metastatic breast cancer, asymmetric osseous uptake on the right at L5-S1, corresponding to degenerative disc disease on recent CT.  Asymmetric soft tissue uptake in the right breast. ? ?INTERVAL HISTORY ?Yvonne Scott is a 61 y.o. female who has above history reviewed by me today presents for follow up visit for management of triple negative breast cancer, stage IIIB. She has ongoing diarrhea. Worse with oral magnesium. In the interim, she presented to ER for cough and shortness of breath worsening, despite levaquin. She was found to be pancytopenic despite gcsf. CT showed possible RUL pneumonia. She received IV antibiotics: cefepime and vancomycin. She was discharged to home 3 days ago.  ? ?Today she reports magnesium has returned. Doesn't improve with lomotil. Some slight improvement with imodium. She took one tablet this morning. No nausea or vomiting. She denies fevers, chills. Cough is improving. She's gaining weight back. 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  ? ?Review of Systems  ?Constitutional:  Positive for fatigue. Negative for appetite change and unexpected weight change.  ?HENT:   Negative for mouth sores, sore throat and trouble swallowing.   ?Respiratory:  Negative for chest tightness and shortness of breath.   ?Cardiovascular:  Negative for leg swelling.  ?Gastrointestinal:  Positive for diarrhea. Negative for abdominal pain, constipation, nausea and vomiting.  ?Genitourinary:  Negative for bladder incontinence and dysuria.   ?Musculoskeletal:  Negative for flank pain and neck stiffness.  ?Skin:  Negative for itching, rash and wound.  ?Neurological:  Negative for dizziness, headaches, light-headedness and  numbness.  ?Hematological:  Negative for adenopathy. Bruises/bleeds easily.  ?Psychiatric/Behavioral:  Negative for confusion, depression and sleep disturbance. The patient is not nervous/anxious.   ? ?MEDICAL HISTORY:  ?Past Medical History:  ?Diagnosis Date  ? Anxiety   ? Aortic atherosclerosis ([place]) 10/24/2016  ? Breast cancer ([place])   ? GERD (gastroesophageal reflux disease)   ? Personal history of chemotherapy   ? ?SURGICAL HISTORY: ?Past Surgical History:  ?Procedure Laterality Date  ? BREAST BIOPSY    ? ?SOCIAL HISTORY: ?Social History  ? ?Socioeconomic History  ? Marital status: Widowed  ?  Spouse name: Not on file  ? Number of children: Not on file  ? Years of education: Not on file  ? Highest education level: Not on file  ?Occupational History  ? Not on file  ?Tobacco Use  ? Smoking status: Every Day  ?  Packs/day: 1.00  ?  Years: 36.00  ?  Pack years: 36.00  ?  Types: Cigarettes  ?  Start date: 03/15/1979  ? Smokeless tobacco: Never  ?Vaping Use  ? Vaping Use: Never used  ?Substance and Sexual Activity  ? Alcohol use: No  ?  Alcohol/week: 3.0 standard drinks  ?  Types: 3 Standard drinks or equivalent per week  ? Drug use: Not Currently  ? Sexual activity: Not Currently  ?  Birth control/protection: None  ?Other Topics Concern  ? Not on file  ?Social History Narrative  ? ** Merged History Encounter **  ?    ? ?Social Determinants of Health  ? ?Financial Resource Strain: Not on file  ?Food Insecurity: Not on file  ?Transportation Needs: Not on file  ?Physical Activity: Not on file  ?Stress: Not on file  ?Social Connections: Not on file  ?Intimate Partner Violence: Not on file  ? ? ?FAMILY HISTORY: ?Family History  ?Problem Relation Age of Onset  ? Cancer Mother 33  ?     breast  ? Cancer Father   ?     melonma  ? Cirrhosis Maternal Grandfather   ? Cancer Paternal Grandmother   ?     skin/breast  ? ?ALLERGIES:  has No Known Allergies. ? ?MEDICATIONS:  ?Current Outpatient Medications  ?Medication Sig Dispense  Refill  ? calcium-vitamin D (OSCAL WITH D) 500-5 MG-MCG tablet Take 2 tablets by mouth daily. 60 tablet 5  ? dexamethasone (DECADRON) 4 MG tablet TAKE 2 TABLETS BY MOUTH ONCE DAILY FOR 2 DAYS AFTER  CHEMOTHERAPY.  TAKE  WITH  FOOD. 15 tablet 0  ? dextromethorphan-guaiFENesin (MUCINEX DM) 30-600 MG 12hr tablet Take 1 tablet by mouth 2 (two) times daily as needed for cough. 30 tablet 1  ? folic acid (FOLVITE) 1 MG tablet Take 1 tablet (1 mg total) by mouth daily. 30 tablet 0  ? gabapentin (NEURONTIN) 100 MG capsule Take 1 capsule (100 mg total) by mouth 3 (three) times daily. 90 capsule 0  ? ibuprofen (ADVIL) 200 MG tablet Take 200 mg by mouth every 6 (six) hours as needed for headache.    ? lidocaine-prilocaine (EMLA) cream Apply to affected area once 30 g 3  ? loperamide (IMODIUM) 2 MG capsule Take 1 capsule (2 mg total) by mouth See admin instructions. Initial: 4 mg, followed by 2 mg after each loose stool; maximum: 16 mg/day 90 capsule 1  ? LORazepam (ATIVAN) 0.5  MG tablet Take 1 tablet (0.5 mg total) by mouth every 12 (twelve) hours as needed for anxiety (nausea). 15 tablet 0  ? magnesium chloride (SLOW-MAG) 64 MG TBEC SR tablet Take 1 tablet (64 mg total) by mouth 2 (two) times daily. 60 tablet 2  ? Multiple Vitamin (MULTIVITAMIN WITH MINERALS) TABS tablet Take 1 tablet by mouth daily. 30 tablet 0  ? omeprazole (PRILOSEC) 20 MG capsule Take 1 capsule (20 mg total) by mouth 2 (two) times daily before a meal. 60 capsule 1  ? ondansetron (ZOFRAN) 8 MG tablet TAKE 1 TABLET BY MOUTH TWICE DAILY AS NEEDED. START THE THIRD DAY AFTER CARBOPLATIN AND AC CHEMOTHERAPY. 30 tablet 0  ? potassium chloride SA (KLOR-CON M) 20 MEQ tablet Take 1 tablet (20 mEq total) by mouth 2 (two) times daily. 60 tablet 1  ? thiamine 100 MG tablet Take 0.5 tablets (50 mg total) by mouth daily. 15 tablet 0  ? diphenoxylate-atropine (LOMOTIL) 2.5-0.025 MG tablet Take 1 tablet by mouth 4 (four) times daily as needed for diarrhea or loose stools.  (Patient not taking: Reported on 07/22/2021) 30 tablet 0  ? fluconazole (DIFLUCAN) 150 MG tablet Take 1 tablet (150 mg total) by mouth See admin instructions. Take one tablet, and repeat the second dose in 3

## 2021-07-22 NOTE — Progress Notes (Unsigned)
Believes mag is causing diarrhea has quit milk and everything else she was told to stop. Diarrhea started again last night; and will like to deal with prior symptoms. Also, stated that she is no longer taking gabapentin 3x a day because she feels that is not effective. C/o acid reflux feels discomfort from throat to middle of her chest.  ?

## 2021-07-22 NOTE — Patient Instructions (Signed)
Magnesium Sulfate Injection What is this medication? MAGNESIUM SULFATE (mag NEE zee um SUL fate) prevents and treats low levels of magnesium in your body. It may also be used to prevent and treat seizures during pregnancy in people with high blood pressure disorders, such as preeclampsia or eclampsia. Magnesium plays an important role in maintaining the health of your muscles and nervous system. This medicine may be used for other purposes; ask your health care provider or pharmacist if you have questions. What should I tell my care team before I take this medication? They need to know if you have any of these conditions: Heart disease History of irregular heart beat Kidney disease An unusual or allergic reaction to magnesium sulfate, medications, foods, dyes, or preservatives Pregnant or trying to get pregnant Breast-feeding How should I use this medication? This medication is for infusion into a vein. It is given in a hospital or clinic setting. Talk to your care team about the use of this medication in children. While this medication may be prescribed for selected conditions, precautions do apply. Overdosage: If you think you have taken too much of this medicine contact a poison control center or emergency room at once. NOTE: This medicine is only for you. Do not share this medicine with others. What if I miss a dose? This does not apply. What may interact with this medication? Certain medications for anxiety or sleep Certain medications for seizures like phenobarbital Digoxin Medications that relax muscles for surgery Narcotic medications for pain This list may not describe all possible interactions. Give your health care provider a list of all the medicines, herbs, non-prescription drugs, or dietary supplements you use. Also tell them if you smoke, drink alcohol, or use illegal drugs. Some items may interact with your medicine. What should I watch for while using this medication? Your  condition will be monitored carefully while you are receiving this medication. You may need blood work done while you are receiving this medication. What side effects may I notice from receiving this medication? Side effects that you should report to your care team as soon as possible: Allergic reactions--skin rash, itching, hives, swelling of the face, lips, tongue, or throat High magnesium level--confusion, drowsiness, facial flushing, redness, sweating, muscle weakness, fast or irregular heartbeat, trouble breathing Low blood pressure--dizziness, feeling faint or lightheaded, blurry vision Side effects that usually do not require medical attention (report to your care team if they continue or are bothersome): Headache Nausea This list may not describe all possible side effects. Call your doctor for medical advice about side effects. You may report side effects to FDA at 1-800-FDA-1088. Where should I keep my medication? This medication is given in a hospital or clinic and will not be stored at home. NOTE: This sheet is a summary. It may not cover all possible information. If you have questions about this medicine, talk to your doctor, pharmacist, or health care provider.  2023 Elsevier/Gold Standard (2020-05-14 00:00:00)  

## 2021-07-22 NOTE — Progress Notes (Signed)
Diarrhea started late last night. Eating some, drinking some. Pt has recently discharged from the hospital. She is taking oral potassium and magnesium. Received 1 L NS and 2 grams magnesium. Pt coming back to clinic tomorrow, she prefers port needle to be removed today. ?

## 2021-07-23 ENCOUNTER — Inpatient Hospital Stay: Payer: Medicaid Other

## 2021-07-23 LAB — CULTURE, BLOOD (ROUTINE X 2)
Culture: NO GROWTH
Culture: NO GROWTH

## 2021-07-26 ENCOUNTER — Inpatient Hospital Stay: Payer: Medicaid Other

## 2021-07-26 VITALS — BP 121/87 | HR 80 | Temp 97.6°F | Resp 16

## 2021-07-26 DIAGNOSIS — Z5112 Encounter for antineoplastic immunotherapy: Secondary | ICD-10-CM | POA: Diagnosis not present

## 2021-07-26 DIAGNOSIS — G893 Neoplasm related pain (acute) (chronic): Secondary | ICD-10-CM

## 2021-07-26 DIAGNOSIS — C50911 Malignant neoplasm of unspecified site of right female breast: Secondary | ICD-10-CM

## 2021-07-26 DIAGNOSIS — T451X5A Adverse effect of antineoplastic and immunosuppressive drugs, initial encounter: Secondary | ICD-10-CM

## 2021-07-26 DIAGNOSIS — R52 Pain, unspecified: Secondary | ICD-10-CM

## 2021-07-26 LAB — COMPREHENSIVE METABOLIC PANEL
ALT: 8 U/L (ref 0–44)
AST: 14 U/L — ABNORMAL LOW (ref 15–41)
Albumin: 3.3 g/dL — ABNORMAL LOW (ref 3.5–5.0)
Alkaline Phosphatase: 78 U/L (ref 38–126)
Anion gap: 10 (ref 5–15)
BUN: 6 mg/dL — ABNORMAL LOW (ref 8–23)
CO2: 23 mmol/L (ref 22–32)
Calcium: 8.9 mg/dL (ref 8.9–10.3)
Chloride: 104 mmol/L (ref 98–111)
Creatinine, Ser: 0.56 mg/dL (ref 0.44–1.00)
GFR, Estimated: >60 mL/min (ref >60)
Glucose, Bld: 92 mg/dL (ref 70–99)
Potassium: 3.5 mmol/L (ref 3.5–5.1)
Sodium: 137 mmol/L (ref 135–145)
Total Bilirubin: <0.1 mg/dL — ABNORMAL LOW (ref 0.3–1.2)
Total Protein: 6.3 g/dL — ABNORMAL LOW (ref 6.5–8.1)

## 2021-07-26 LAB — TSH: TSH: 1.158 u[IU]/mL (ref 0.350–4.500)

## 2021-07-26 LAB — CBC WITH DIFFERENTIAL/PLATELET
Abs Immature Granulocytes: 1.46 10*3/uL — ABNORMAL HIGH (ref 0.00–0.07)
Basophils Absolute: 0.1 10*3/uL (ref 0.0–0.1)
Basophils Relative: 0 %
Eosinophils Absolute: 0 10*3/uL (ref 0.0–0.5)
Eosinophils Relative: 0 %
HCT: 25.6 % — ABNORMAL LOW (ref 36.0–46.0)
Hemoglobin: 8.6 g/dL — ABNORMAL LOW (ref 12.0–15.0)
Immature Granulocytes: 8 %
Lymphocytes Relative: 14 %
Lymphs Abs: 2.7 10*3/uL (ref 0.7–4.0)
MCH: 35.1 pg — ABNORMAL HIGH (ref 26.0–34.0)
MCHC: 33.6 g/dL (ref 30.0–36.0)
MCV: 104.5 fL — ABNORMAL HIGH (ref 80.0–100.0)
Monocytes Absolute: 1.5 10*3/uL — ABNORMAL HIGH (ref 0.1–1.0)
Monocytes Relative: 8 %
Neutro Abs: 13.2 10*3/uL — ABNORMAL HIGH (ref 1.7–7.7)
Neutrophils Relative %: 70 %
Platelets: 491 10*3/uL — ABNORMAL HIGH (ref 150–400)
RBC: 2.45 MIL/uL — ABNORMAL LOW (ref 3.87–5.11)
RDW: 15.2 % (ref 11.5–15.5)
WBC: 19 10*3/uL — ABNORMAL HIGH (ref 4.0–10.5)
nRBC: 0 % (ref 0.0–0.2)

## 2021-07-26 LAB — T4, FREE: Free T4: 0.92 ng/dL (ref 0.61–1.12)

## 2021-07-26 LAB — MAGNESIUM: Magnesium: 1.1 mg/dL — ABNORMAL LOW (ref 1.7–2.4)

## 2021-07-26 MED ORDER — DIPHENOXYLATE-ATROPINE 2.5-0.025 MG PO TABS
2.0000 | ORAL_TABLET | Freq: Once | ORAL | Status: AC
  Administered 2021-07-26: 2 via ORAL
  Filled 2021-07-26: qty 2

## 2021-07-26 MED ORDER — HEPARIN SOD (PORK) LOCK FLUSH 100 UNIT/ML IV SOLN
500.0000 [IU] | Freq: Once | INTRAVENOUS | Status: AC
  Administered 2021-07-26: 500 [IU] via INTRAVENOUS
  Filled 2021-07-26: qty 5

## 2021-07-26 MED ORDER — SODIUM CHLORIDE 0.9% FLUSH
10.0000 mL | Freq: Once | INTRAVENOUS | Status: AC
  Administered 2021-07-26: 10 mL via INTRAVENOUS
  Filled 2021-07-26: qty 10

## 2021-07-26 MED ORDER — SODIUM CHLORIDE 0.9 % IV SOLN
Freq: Once | INTRAVENOUS | Status: AC
  Filled 2021-07-26: qty 250

## 2021-07-26 MED ORDER — DIPHENOXYLATE-ATROPINE 2.5-0.025 MG PO TABS: 2.0000 | ORAL_TABLET | Freq: Once | ORAL | Status: DC | PRN

## 2021-07-26 MED ORDER — MAGNESIUM SULFATE 4 GM/100ML IV SOLN
4.0000 g | Freq: Once | INTRAVENOUS | Status: AC
  Administered 2021-07-26: 4 g via INTRAVENOUS
  Filled 2021-07-26: qty 100

## 2021-07-26 NOTE — Patient Instructions (Signed)
Magnesium Sulfate Injection What is this medication? MAGNESIUM SULFATE (mag NEE zee um SUL fate) prevents and treats low levels of magnesium in your body. It may also be used to prevent and treat seizures during pregnancy in people with high blood pressure disorders, such as preeclampsia or eclampsia. Magnesium plays an important role in maintaining the health of your muscles and nervous system. This medicine may be used for other purposes; ask your health care provider or pharmacist if you have questions. What should I tell my care team before I take this medication? They need to know if you have any of these conditions: Heart disease History of irregular heart beat Kidney disease An unusual or allergic reaction to magnesium sulfate, medications, foods, dyes, or preservatives Pregnant or trying to get pregnant Breast-feeding How should I use this medication? This medication is for infusion into a vein. It is given in a hospital or clinic setting. Talk to your care team about the use of this medication in children. While this medication may be prescribed for selected conditions, precautions do apply. Overdosage: If you think you have taken too much of this medicine contact a poison control center or emergency room at once. NOTE: This medicine is only for you. Do not share this medicine with others. What if I miss a dose? This does not apply. What may interact with this medication? Certain medications for anxiety or sleep Certain medications for seizures like phenobarbital Digoxin Medications that relax muscles for surgery Narcotic medications for pain This list may not describe all possible interactions. Give your health care provider a list of all the medicines, herbs, non-prescription drugs, or dietary supplements you use. Also tell them if you smoke, drink alcohol, or use illegal drugs. Some items may interact with your medicine. What should I watch for while using this medication? Your  condition will be monitored carefully while you are receiving this medication. You may need blood work done while you are receiving this medication. What side effects may I notice from receiving this medication? Side effects that you should report to your care team as soon as possible: Allergic reactions--skin rash, itching, hives, swelling of the face, lips, tongue, or throat High magnesium level--confusion, drowsiness, facial flushing, redness, sweating, muscle weakness, fast or irregular heartbeat, trouble breathing Low blood pressure--dizziness, feeling faint or lightheaded, blurry vision Side effects that usually do not require medical attention (report to your care team if they continue or are bothersome): Headache Nausea This list may not describe all possible side effects. Call your doctor for medical advice about side effects. You may report side effects to FDA at 1-800-FDA-1088. Where should I keep my medication? This medication is given in a hospital or clinic and will not be stored at home. NOTE: This sheet is a summary. It may not cover all possible information. If you have questions about this medicine, talk to your doctor, pharmacist, or health care provider.  2023 Elsevier/Gold Standard (2020-05-14 00:00:00)  

## 2021-07-26 NOTE — Progress Notes (Signed)
Today patient Mg 1.1,  Lauren, NP notified. Give 4g Mg per NP. Per patient last Mg transfusion resulted in terrible diarrhea. NP ordered 2 Lomotil tablets, given. Patient tolerated 1L NS & 4g Mg well. Denies questions/ concerns, discharged. Stable ?

## 2021-07-28 ENCOUNTER — Other Ambulatory Visit: Payer: Medicaid Other

## 2021-07-28 ENCOUNTER — Ambulatory Visit: Payer: Medicaid Other | Admitting: Oncology

## 2021-07-28 ENCOUNTER — Ambulatory Visit: Payer: Medicaid Other

## 2021-07-29 ENCOUNTER — Inpatient Hospital Stay: Payer: Medicaid Other

## 2021-07-29 DIAGNOSIS — C50911 Malignant neoplasm of unspecified site of right female breast: Secondary | ICD-10-CM

## 2021-07-29 DIAGNOSIS — Z5112 Encounter for antineoplastic immunotherapy: Secondary | ICD-10-CM | POA: Diagnosis not present

## 2021-07-29 LAB — COMPREHENSIVE METABOLIC PANEL
ALT: 9 U/L (ref 0–44)
AST: 16 U/L (ref 15–41)
Albumin: 3.3 g/dL — ABNORMAL LOW (ref 3.5–5.0)
Alkaline Phosphatase: 66 U/L (ref 38–126)
Anion gap: 9 (ref 5–15)
BUN: 10 mg/dL (ref 8–23)
CO2: 26 mmol/L (ref 22–32)
Calcium: 9.1 mg/dL (ref 8.9–10.3)
Chloride: 103 mmol/L (ref 98–111)
Creatinine, Ser: 0.65 mg/dL (ref 0.44–1.00)
GFR, Estimated: >60 mL/min (ref >60)
Glucose, Bld: 99 mg/dL (ref 70–99)
Potassium: 3.9 mmol/L (ref 3.5–5.1)
Sodium: 138 mmol/L (ref 135–145)
Total Bilirubin: 0.4 mg/dL (ref 0.3–1.2)
Total Protein: 6.5 g/dL (ref 6.5–8.1)

## 2021-07-29 LAB — CBC WITH DIFFERENTIAL/PLATELET
Abs Immature Granulocytes: 1.03 10*3/uL — ABNORMAL HIGH (ref 0.00–0.07)
Basophils Absolute: 0.1 10*3/uL (ref 0.0–0.1)
Basophils Relative: 1 %
Eosinophils Absolute: 0 10*3/uL (ref 0.0–0.5)
Eosinophils Relative: 0 %
HCT: 28.4 % — ABNORMAL LOW (ref 36.0–46.0)
Hemoglobin: 9.3 g/dL — ABNORMAL LOW (ref 12.0–15.0)
Immature Granulocytes: 7 %
Lymphocytes Relative: 28 %
Lymphs Abs: 3.9 10*3/uL (ref 0.7–4.0)
MCH: 34.2 pg — ABNORMAL HIGH (ref 26.0–34.0)
MCHC: 32.7 g/dL (ref 30.0–36.0)
MCV: 104.4 fL — ABNORMAL HIGH (ref 80.0–100.0)
Monocytes Absolute: 1.2 10*3/uL — ABNORMAL HIGH (ref 0.1–1.0)
Monocytes Relative: 9 %
Neutro Abs: 7.7 10*3/uL (ref 1.7–7.7)
Neutrophils Relative %: 55 %
Platelets: 528 10*3/uL — ABNORMAL HIGH (ref 150–400)
RBC: 2.72 MIL/uL — ABNORMAL LOW (ref 3.87–5.11)
RDW: 15.3 % (ref 11.5–15.5)
Smear Review: NORMAL
WBC: 14 10*3/uL — ABNORMAL HIGH (ref 4.0–10.5)
nRBC: 0.3 % — ABNORMAL HIGH (ref 0.0–0.2)

## 2021-07-29 LAB — MAGNESIUM: Magnesium: 1.4 mg/dL — ABNORMAL LOW (ref 1.7–2.4)

## 2021-07-29 MED ORDER — MAGNESIUM SULFATE 4 GM/100ML IV SOLN
4.0000 g | Freq: Once | INTRAVENOUS | Status: AC
  Administered 2021-07-29: 4 g via INTRAVENOUS

## 2021-07-29 MED ORDER — MAGNESIUM SULFATE 4 GM/100ML IV SOLN
4.0000 g | Freq: Once | INTRAVENOUS | Status: AC
  Filled 2021-07-29: qty 100

## 2021-07-29 MED ORDER — SODIUM CHLORIDE 0.9 % IV SOLN
Freq: Once | INTRAVENOUS | Status: AC
  Filled 2021-07-29: qty 250

## 2021-07-29 MED ORDER — HEPARIN SOD (PORK) LOCK FLUSH 100 UNIT/ML IV SOLN
INTRAVENOUS | Status: AC
  Filled 2021-07-29: qty 5

## 2021-07-30 ENCOUNTER — Ambulatory Visit: Payer: Medicaid Other

## 2021-08-02 ENCOUNTER — Inpatient Hospital Stay (HOSPITAL_BASED_OUTPATIENT_CLINIC_OR_DEPARTMENT_OTHER): Payer: Medicaid Other | Admitting: Hospice and Palliative Medicine

## 2021-08-02 ENCOUNTER — Other Ambulatory Visit: Payer: Self-pay

## 2021-08-02 DIAGNOSIS — C50911 Malignant neoplasm of unspecified site of right female breast: Secondary | ICD-10-CM

## 2021-08-02 DIAGNOSIS — Z515 Encounter for palliative care: Secondary | ICD-10-CM

## 2021-08-02 MED FILL — Dexamethasone Sodium Phosphate Inj 100 MG/10ML: INTRAMUSCULAR | Qty: 1 | Status: AC

## 2021-08-02 MED FILL — Fosaprepitant Dimeglumine For IV Infusion 150 MG (Base Eq): INTRAVENOUS | Qty: 5 | Status: AC

## 2021-08-02 NOTE — Progress Notes (Signed)
Voicemail left.  Will reschedule. °

## 2021-08-03 ENCOUNTER — Inpatient Hospital Stay: Payer: Medicaid Other

## 2021-08-03 ENCOUNTER — Inpatient Hospital Stay (HOSPITAL_BASED_OUTPATIENT_CLINIC_OR_DEPARTMENT_OTHER): Payer: Medicaid Other | Admitting: Oncology

## 2021-08-03 VITALS — BP 119/80 | HR 86 | Temp 97.7°F | Resp 18 | Wt 103.0 lb

## 2021-08-03 DIAGNOSIS — G620 Drug-induced polyneuropathy: Secondary | ICD-10-CM

## 2021-08-03 DIAGNOSIS — Z5112 Encounter for antineoplastic immunotherapy: Secondary | ICD-10-CM | POA: Diagnosis not present

## 2021-08-03 DIAGNOSIS — K521 Toxic gastroenteritis and colitis: Secondary | ICD-10-CM

## 2021-08-03 DIAGNOSIS — D701 Agranulocytosis secondary to cancer chemotherapy: Secondary | ICD-10-CM | POA: Diagnosis not present

## 2021-08-03 DIAGNOSIS — C50911 Malignant neoplasm of unspecified site of right female breast: Secondary | ICD-10-CM

## 2021-08-03 DIAGNOSIS — T451X5A Adverse effect of antineoplastic and immunosuppressive drugs, initial encounter: Secondary | ICD-10-CM

## 2021-08-03 DIAGNOSIS — C801 Malignant (primary) neoplasm, unspecified: Secondary | ICD-10-CM

## 2021-08-03 DIAGNOSIS — F411 Generalized anxiety disorder: Secondary | ICD-10-CM

## 2021-08-03 LAB — CBC WITH DIFFERENTIAL/PLATELET
Abs Immature Granulocytes: 0.49 10*3/uL — ABNORMAL HIGH (ref 0.00–0.07)
Basophils Absolute: 0.1 10*3/uL (ref 0.0–0.1)
Basophils Relative: 1 %
Eosinophils Absolute: 0 10*3/uL (ref 0.0–0.5)
Eosinophils Relative: 0 %
HCT: 31.1 % — ABNORMAL LOW (ref 36.0–46.0)
Hemoglobin: 10.2 g/dL — ABNORMAL LOW (ref 12.0–15.0)
Immature Granulocytes: 4 %
Lymphocytes Relative: 31 %
Lymphs Abs: 3.7 10*3/uL (ref 0.7–4.0)
MCH: 34.1 pg — ABNORMAL HIGH (ref 26.0–34.0)
MCHC: 32.8 g/dL (ref 30.0–36.0)
MCV: 104 fL — ABNORMAL HIGH (ref 80.0–100.0)
Monocytes Absolute: 1.2 10*3/uL — ABNORMAL HIGH (ref 0.1–1.0)
Monocytes Relative: 10 %
Neutro Abs: 6.3 10*3/uL (ref 1.7–7.7)
Neutrophils Relative %: 54 %
Platelets: 420 10*3/uL — ABNORMAL HIGH (ref 150–400)
RBC: 2.99 MIL/uL — ABNORMAL LOW (ref 3.87–5.11)
RDW: 14.8 % (ref 11.5–15.5)
WBC: 11.8 10*3/uL — ABNORMAL HIGH (ref 4.0–10.5)
nRBC: 0 % (ref 0.0–0.2)

## 2021-08-03 LAB — COMPREHENSIVE METABOLIC PANEL
ALT: 10 U/L (ref 0–44)
AST: 16 U/L (ref 15–41)
Albumin: 3.5 g/dL (ref 3.5–5.0)
Alkaline Phosphatase: 62 U/L (ref 38–126)
Anion gap: 8 (ref 5–15)
BUN: 11 mg/dL (ref 8–23)
CO2: 24 mmol/L (ref 22–32)
Calcium: 8.5 mg/dL — ABNORMAL LOW (ref 8.9–10.3)
Chloride: 104 mmol/L (ref 98–111)
Creatinine, Ser: 0.65 mg/dL (ref 0.44–1.00)
GFR, Estimated: >60 mL/min (ref >60)
Glucose, Bld: 89 mg/dL (ref 70–99)
Potassium: 3.6 mmol/L (ref 3.5–5.1)
Sodium: 136 mmol/L (ref 135–145)
Total Bilirubin: 0.3 mg/dL (ref 0.3–1.2)
Total Protein: 6.9 g/dL (ref 6.5–8.1)

## 2021-08-03 MED ORDER — HEPARIN SOD (PORK) LOCK FLUSH 100 UNIT/ML IV SOLN
INTRAVENOUS | Status: AC
  Administered 2021-08-03: 500 [IU]
  Filled 2021-08-03: qty 5

## 2021-08-03 MED ORDER — PALONOSETRON HCL INJECTION 0.25 MG/5ML
0.2500 mg | Freq: Once | INTRAVENOUS | Status: AC
  Administered 2021-08-03: 0.25 mg via INTRAVENOUS
  Filled 2021-08-03: qty 5

## 2021-08-03 MED ORDER — DEXAMETHASONE 4 MG PO TABS: ORAL_TABLET | ORAL | 0 refills | Status: DC

## 2021-08-03 MED ORDER — DOXORUBICIN HCL CHEMO IV INJECTION 2 MG/ML
60.0000 mg/m2 | Freq: Once | INTRAVENOUS | Status: AC
  Administered 2021-08-03: 86 mg via INTRAVENOUS
  Filled 2021-08-03: qty 43

## 2021-08-03 MED ORDER — SODIUM CHLORIDE 0.9 % IV SOLN
200.0000 mg | Freq: Once | INTRAVENOUS | Status: AC
  Administered 2021-08-03: 200 mg via INTRAVENOUS
  Filled 2021-08-03: qty 200

## 2021-08-03 MED ORDER — SODIUM CHLORIDE 0.9 % IV SOLN
150.0000 mg | Freq: Once | INTRAVENOUS | Status: AC
  Administered 2021-08-03: 150 mg via INTRAVENOUS
  Filled 2021-08-03: qty 150

## 2021-08-03 MED ORDER — SODIUM CHLORIDE 0.9 % IV SOLN
Freq: Once | INTRAVENOUS | Status: AC
  Filled 2021-08-03: qty 250

## 2021-08-03 MED ORDER — SODIUM CHLORIDE 0.9 % IV SOLN
600.0000 mg/m2 | Freq: Once | INTRAVENOUS | Status: AC
  Administered 2021-08-03: 860 mg via INTRAVENOUS
  Filled 2021-08-03: qty 43

## 2021-08-03 MED ORDER — SODIUM CHLORIDE 0.9 % IV SOLN
10.0000 mg | Freq: Once | INTRAVENOUS | Status: AC
  Administered 2021-08-03: 10 mg via INTRAVENOUS
  Filled 2021-08-03: qty 10

## 2021-08-03 MED ORDER — HEPARIN SOD (PORK) LOCK FLUSH 100 UNIT/ML IV SOLN
500.0000 [IU] | Freq: Once | INTRAVENOUS | Status: AC | PRN
  Filled 2021-08-03: qty 5

## 2021-08-03 NOTE — Progress Notes (Signed)
Hematology/Oncology Progress note [phone] [phone]         Patient Care Team: [name], NP as PCP - General (Nurse Practitioner)  REFERRING PROVIDER: [name], NP  CHIEF COMPLAINTS/REASON FOR VISIT:  Follow-up for triple negative right breast cancer.  HISTORY OF PRESENTING ILLNESS:   Yvonne Scott is a  61 y.o.  female with PMH listed below was seen in consultation at the request of  [name], NP  for evaluation of breast cancer.  August 2022, patient was diagnosed with right breast stage IIIb cT3 N0M0, grade 2, ER negative, PR negative HER2 negative breast cancer.  Patient self palpated breast mass many years ago.  10/21/2020 diagnostic mammogram and ultrasound confirmed presence of mass and a subsequent biopsy revealed right breast triple negative cancer. There was plan for patient to start with neoadjuvant chemotherapy followed by surgery and radiation.  Unfortunately patient never started treatment.  Patient was accompanied by her mother.  They want to transfer her oncology care to locally.  Patient reports having right breast pain as well as generalized pain.  Patient had a [name] placed 3 weeks ago and she reports some swelling around the sites.  Patient has had negative genetic testing at [facility].  Patient has a history of alcohol dependence.  Per patient she has not been drinking recently.  Everyday smoker.   02/24/2021, CT chest abdomen pelvis showed large right breast mass, 5.5 x 4.8 cm with small right axillary lymph node with variable degrees of enhancement potentially involved but not specific based on size.  This tracked down to retropectoral nodal stations largest lymph node along the lateral margin of the pectoralis major.  Tiny pulmonary nodules in the chest as discussed.  These are nonspecific but would consider short interval follow-up to assess for any changes.  No evidence of metastatic disease involving the  abdomen or pelvis.  Aortic atherosclerosis.  02/25/2021, echocardiogram showed LVEF 65 to 70%.  Patient has been to chemotherapy class and understands antiemetics instructions.  03/16/2021, bone scan showed no evidence of osseous metastatic breast cancer, asymmetric osseous uptake on the right at L5-S1, corresponding to degenerative disc disease on recent CT.  Asymmetric soft tissue uptake in the right breast.  07/05/2021, right diagnostic mammogram and ultrasound showed interval decrease of the size of the right breast mass, currently 4.6 x 3.6 x 2.6, previously 5.1 x 4.4 x 3.5.  No suspicious right axillary lymphadenopathy on examination.  INTERVAL HISTORY Yvonne Scott is a 61 y.o. female who has above history reviewed by me today presents for follow up visit for management of triple negative breast cancer, stage IIIB 07/16/2021-07/19/2021, patient was admitted for HCAP patient was treated with antibiotics. She followed up for posthospitalization visit and was seen by my colleague Beckey Rutter.  Patient has been doing well.  Appetite is fair.  She continues to smoke 4 to 5 cigarettes/day. Mild baseline cough nonproductive.  No fever or chills, nausea vomiting diarrhea. She has felt weak for the past couple days and today she feels quite well.     Review of Systems  Constitutional:  Positive for fatigue. Negative for appetite change, chills and fever.  HENT:   Negative for hearing loss, mouth sores and voice change.   Eyes:  Negative for eye problems.  Respiratory:  Negative for chest tightness and cough.   Cardiovascular:  Negative for chest pain.  Gastrointestinal:  Negative for abdominal distention, abdominal pain, blood in stool, diarrhea and nausea.  Endocrine: Negative for hot flashes.  Genitourinary:  Negative for difficulty urinating, dysuria and frequency.   Musculoskeletal:  Negative for arthralgias.  Skin:  Negative for itching and rash.  Neurological:  Positive for numbness.  Negative for extremity weakness.  Hematological:  Negative for adenopathy.  Psychiatric/Behavioral:  Negative for confusion. The patient is not nervous/anxious.    MEDICAL HISTORY:  Past Medical History:  Diagnosis Date   Anxiety    Aortic atherosclerosis ([place]) 10/24/2016   Breast cancer (HCC)    GERD (gastroesophageal reflux disease)    Personal history of chemotherapy     SURGICAL HISTORY: Past Surgical History:  Procedure Laterality Date   BREAST BIOPSY      SOCIAL HISTORY: Social History   Socioeconomic History   Marital status: Widowed    Spouse name: Not on file   Number of children: Not on file   Years of education: Not on file   Highest education level: Not on file  Occupational History   Not on file  Tobacco Use   Smoking status: Every Day    Packs/day: 1.00    Years: 36.00    Pack years: 36.00    Types: Cigarettes    Start date: 03/15/1979   Smokeless tobacco: Never  Vaping Use   Vaping Use: Never used  Substance and Sexual Activity   Alcohol use: No    Alcohol/week: 3.0 standard drinks    Types: 3 Standard drinks or equivalent per week   Drug use: Not Currently   Sexual activity: Not Currently    Birth control/protection: None  Other Topics Concern   Not on file  Social History Narrative   ** Merged History Encounter **       Social Determinants of Health   Financial Resource Strain: Not on file  Food Insecurity: Not on file  Transportation Needs: Not on file  Physical Activity: Not on file  Stress: Not on file  Social Connections: Not on file  Intimate Partner Violence: Not on file    FAMILY HISTORY: Family History  Problem Relation Age of Onset   Cancer Mother 79       breast   Cancer Father        melonma   Cirrhosis Maternal Grandfather    Cancer Paternal Grandmother        skin/breast    ALLERGIES:  has No Known Allergies.  MEDICATIONS:  Current Outpatient Medications  Medication Sig Dispense Refill   calcium-vitamin D  (OSCAL WITH D) 500-5 MG-MCG tablet Take 2 tablets by mouth daily. 60 tablet 5   dextromethorphan-guaiFENesin (MUCINEX DM) 30-600 MG 12hr tablet Take 1 tablet by mouth 2 (two) times daily as needed for cough. 30 tablet 1   folic acid (FOLVITE) 1 MG tablet Take 1 tablet (1 mg total) by mouth daily. 30 tablet 0   gabapentin (NEURONTIN) 100 MG capsule Take 1 capsule (100 mg total) by mouth 3 (three) times daily. 90 capsule 0   ibuprofen (ADVIL) 200 MG tablet Take 200 mg by mouth every 6 (six) hours as needed for headache.     lidocaine-prilocaine (EMLA) cream Apply to affected area once 30 g 3   LORazepam (ATIVAN) 0.5 MG tablet Take 1 tablet (0.5 mg total) by mouth every 12 (twelve) hours as needed for anxiety (nausea). 15 tablet 0   magic mouthwash (multi-ingredient) oral suspension Take 5 mLs by mouth 4 (four) times daily as needed for mouth pain. 480 mL 1   magnesium chloride (SLOW-MAG) 64 MG  TBEC SR tablet Take 1 tablet (64 mg total) by mouth 2 (two) times daily. 60 tablet 2   Multiple Vitamin (MULTIVITAMIN WITH MINERALS) TABS tablet Take 1 tablet by mouth daily. 30 tablet 0   omeprazole (PRILOSEC) 20 MG capsule Take 1 capsule (20 mg total) by mouth 2 (two) times daily before a meal. 60 capsule 1   ondansetron (ZOFRAN) 8 MG tablet TAKE 1 TABLET BY MOUTH TWICE DAILY AS NEEDED. START THE THIRD DAY AFTER CARBOPLATIN AND AC CHEMOTHERAPY. 30 tablet 0   potassium chloride SA (KLOR-CON M) 20 MEQ tablet Take 1 tablet (20 mEq total) by mouth 2 (two) times daily. 60 tablet 1   prochlorperazine (COMPAZINE) 10 MG tablet Take 1 tablet (10 mg total) by mouth every 6 (six) hours as needed (Nausea or vomiting). 30 tablet 1   thiamine 100 MG tablet Take 0.5 tablets (50 mg total) by mouth daily. 15 tablet 0   VENTOLIN HFA 108 (90 Base) MCG/ACT inhaler Inhale 2 puffs into the lungs 4 (four) times daily as needed.     dexamethasone (DECADRON) 4 MG tablet TAKE 2 TABLETS BY MOUTH ONCE DAILY FOR 2 DAYS AFTER  CHEMOTHERAPY.   TAKE  WITH  FOOD. 15 tablet 0   diphenoxylate-atropine (LOMOTIL) 2.5-0.025 MG tablet Take 1 tablet by mouth 4 (four) times daily as needed for diarrhea or loose stools. (Patient not taking: Reported on 07/22/2021) 30 tablet 0   fluconazole (DIFLUCAN) 150 MG tablet Take 1 tablet (150 mg total) by mouth See admin instructions. Take one tablet, and repeat the second dose in 3 days. (Patient not taking: Reported on 06/01/2021) 2 tablet 0   loperamide (IMODIUM) 2 MG capsule Take 1 capsule (2 mg total) by mouth See admin instructions. Initial: 4 mg, followed by 2 mg after each loose stool; maximum: 16 mg/day (Patient not taking: Reported on 08/03/2021) 90 capsule 1   No current facility-administered medications for this visit.     PHYSICAL EXAMINATION: ECOG PERFORMANCE STATUS: 1 - Symptomatic but completely ambulatory Vitals:   08/03/21 0835  BP: 119/80  Pulse: 86  Resp: 18  Temp: 97.7 F (36.5 C)   Filed Weights   08/03/21 0835  Weight: 103 lb (46.7 kg)    Physical Exam Constitutional:      General: She is not in acute distress. HENT:     Head: Normocephalic and atraumatic.  Eyes:     General: No scleral icterus. Cardiovascular:     Rate and Rhythm: Normal rate and regular rhythm.     Heart sounds: Normal heart sounds.  Pulmonary:     Effort: Pulmonary effort is normal. No respiratory distress.     Breath sounds: No wheezing.  Abdominal:     General: Bowel sounds are normal. There is no distension.     Palpations: Abdomen is soft.  Musculoskeletal:        General: No deformity. Normal range of motion.     Cervical back: Normal range of motion and neck supple.  Skin:    General: Skin is warm and dry.     Findings: No erythema or rash.  Neurological:     Mental Status: She is alert and oriented to person, place, and time. Mental status is at baseline.     Cranial Nerves: No cranial nerve deficit.     Coordination: Coordination normal.  Psychiatric:        Mood and Affect:  Mood normal.   Right breast mass has decreased in size  LABORATORY  DATA:  I have reviewed the data as listed Lab Results  Component Value Date   WBC 11.8 (H) 08/03/2021   HGB 10.2 (L) 08/03/2021   HCT 31.1 (L) 08/03/2021   MCV 104.0 (H) 08/03/2021   PLT 420 (H) 08/03/2021   Recent Labs    07/26/21 0841 07/29/21 0752 08/03/21 0808  NA 137 138 136  K 3.5 3.9 3.6  CL 104 103 104  CO2 _0 GLUCOSE 92 99 89  BUN 6* 10 11  CREATININE 0.56 0.65 0.65  CALCIUM 8.9 9.1 8.5*  GFRNONAA >60 >60 >60  PROT 6.3* 6.5 6.9  ALBUMIN 3.3* 3.3* 3.5  AST 14* 16 16  ALT _1 ALKPHOS 78 66 62  BILITOT <0.1* 0.4 0.3    Iron/TIBC/Ferritin/ %Sat    Component Value Date/Time   IRON 80 10/24/2016 1534   TIBC 282 10/24/2016 1534   FERRITIN 282 (H) 11/17/2016 1404   IRONPCTSAT 28 10/24/2016 1534       RADIOGRAPHIC STUDIES: I have personally reviewed the radiological images as listed and agreed with the findings in the report. DG Chest 2 View  Result Date: 07/16/2021 CLINICAL DATA:  Shortness of breath.  Cough. EXAM: CHEST - 2 VIEW COMPARISON:  Jul 14, 2021. FINDINGS: The heart size and mediastinal contours are within normal limits. Stable position of left internal jugular Port-A-Cath. Hyperexpansion of the lungs is noted. Right lung is clear. Minimal left midlung subsegmental atelectasis is noted. The visualized skeletal structures are unremarkable. IMPRESSION: Hyperexpansion of the lungs is noted. Minimal left midlung subsegmental atelectasis is noted. Electronically Signed   By: Marijo Conception M.D.   On: 07/16/2021 12:18   DG Chest 2 View  Result Date: 07/14/2021 CLINICAL DATA:  Chest congestion history of breast carcinoma EXAM: CHEST - 2 VIEW COMPARISON:  04/29/2021 FINDINGS: Cardiac size is within normal limits. Increase in AP diameter of chest suggests COPD. Lung fields are clear of any infiltrates or pulmonary edema. There is no pleural effusion or pneumothorax. Tip of left IJ  chest port is seen in the superior vena cava close to the right atrium. IMPRESSION: No active cardiopulmonary disease. Electronically Signed   By: Elmer Picker M.D.   On: 07/14/2021 14:43   CT Angio Chest PE W and/or Wo Contrast  Result Date: 07/16/2021 CLINICAL DATA:  Pulmonary embolism (PE) suspected, positive D-dimer EXAM: CT ANGIOGRAPHY CHEST WITH CONTRAST TECHNIQUE: Multidetector CT imaging of the chest was performed using the standard protocol during bolus administration of intravenous contrast. Multiplanar CT image reconstructions and MIPs were obtained to evaluate the vascular anatomy. RADIATION DOSE REDUCTION: This exam was performed according to the departmental dose-optimization program which includes automated exposure control, adjustment of the mA and/or kV according to patient size and/or use of iterative reconstruction technique. CONTRAST:  48m OMNIPAQUE IOHEXOL 350 MG/ML SOLN COMPARISON:  CT chest 02/24/2021.  Same day chest x-ray. FINDINGS: Cardiovascular: Satisfactory opacification of the pulmonary arteries to the segmental level. No evidence of pulmonary embolism. Normal heart size. No pericardial effusion. Aortic atherosclerosis coronary artery atherosclerosis. Mediastinum/Nodes: No enlarged mediastinal, hilar, or axillary lymph nodes. Previously seen right axillary lymph nodes are decreased. Thyroid gland, trachea, and esophagus demonstrate no significant findings. Lungs/Pleura: Emphysema. Small area of ground-glass opacification in the anterior aspect of the right upper lobe. Similar 2-3 mm pulmonary nodule in the right upper lobe. Mild ground-glass opacities in the dependent lungs bilaterally, probably atelectasis. No pleural effusion or pneumothorax. Upper Abdomen: No acute abnormality.  Musculoskeletal: Decreased size of the right breast mass, now measuring 4.3 x 2.3 cm Review of the MIP images confirms the above findings. IMPRESSION: 1. No evidence of acute pulmonary embolism. 2.  Small focus of ground-glass opacification in the anterior aspect of the right upper lobe may be infectious or inflammatory. 3. Mild dependent atelectasis. 4. Aortic Atherosclerosis (ICD10-I70.0) and Emphysema (ICD10-J43.9). 5. Decreased size of the right breast mass, now measuring 4.3 x 2.3 cm. Improved right axillary adenopathy. Electronically Signed   By: Margaretha Sheffield M.D.   On: 07/16/2021 14:18   US Venous Img Lower Bilateral (DVT)  Result Date: 07/17/2021 CLINICAL DATA:  61 year old female with history of positive D-dimer. Evaluate for deep venous thrombosis. EXAM: BILATERAL LOWER EXTREMITY VENOUS DOPPLER ULTRASOUND TECHNIQUE: Gray-scale sonography with graded compression, as well as color Doppler and duplex ultrasound were performed to evaluate the lower extremity deep venous systems from the level of the common femoral vein and including the common femoral, femoral, profunda femoral, popliteal and calf veins including the posterior tibial, peroneal and gastrocnemius veins when visible. The superficial great saphenous vein was also interrogated. Spectral Doppler was utilized to evaluate flow at rest and with distal augmentation maneuvers in the common femoral, femoral and popliteal veins. COMPARISON:  None Available. FINDINGS: RIGHT LOWER EXTREMITY Common Femoral Vein: No evidence of thrombus. Normal compressibility, respiratory phasicity and response to augmentation. Saphenofemoral Junction: No evidence of thrombus. Normal compressibility and flow on color Doppler imaging. Profunda Femoral Vein: No evidence of thrombus. Normal compressibility and flow on color Doppler imaging. Femoral Vein: No evidence of thrombus. Normal compressibility, respiratory phasicity and response to augmentation. Popliteal Vein: No evidence of thrombus. Normal compressibility, respiratory phasicity and response to augmentation. Calf Veins: No evidence of thrombus. Normal compressibility and flow on color Doppler imaging.  Superficial Great Saphenous Vein: No evidence of thrombus. Normal compressibility. Venous Reflux:  None. Other Findings:  None. LEFT LOWER EXTREMITY Common Femoral Vein: No evidence of thrombus. Normal compressibility, respiratory phasicity and response to augmentation. Saphenofemoral Junction: No evidence of thrombus. Normal compressibility and flow on color Doppler imaging. Profunda Femoral Vein: No evidence of thrombus. Normal compressibility and flow on color Doppler imaging. Femoral Vein: No evidence of thrombus. Normal compressibility, respiratory phasicity and response to augmentation. Popliteal Vein: No evidence of thrombus. Normal compressibility, respiratory phasicity and response to augmentation. Calf Veins: No evidence of thrombus. Normal compressibility and flow on color Doppler imaging. Superficial Great Saphenous Vein: No evidence of thrombus. Normal compressibility. Venous Reflux:  None. Other Findings:  None. IMPRESSION: No evidence of deep venous thrombosis in either lower extremity. Electronically Signed   By: Vinnie Langton M.D.   On: 07/17/2021 07:05   US BREAST LTD UNI RIGHT INC AXILLA  Result Date: 07/05/2021 CLINICAL DATA:  61 year old female with known right breast cancer. EXAM: DIGITAL DIAGNOSTIC UNILATERAL RIGHT MAMMOGRAM WITH TOMOSYNTHESIS AND CAD; ULTRASOUND RIGHT BREAST LIMITED TECHNIQUE: Right digital diagnostic mammography and breast tomosynthesis was performed. The images were evaluated with computer-aided detection.; Targeted ultrasound examination of the right breast was performed COMPARISON:  Previous exam(s). ACR Breast Density Category c: The breast tissue is heterogeneously dense, which may obscure small masses. FINDINGS: Hyperdense mass in the upper outer right breast is decreased in size and density when compared to prior examination. Associated post biopsy clips again noted. No new findings in the remainder of the right breast. Targeted ultrasound is performed, showing  interval decrease in size of the hypoechoic right breast mass at the 11 o'clock position 1 cm  from the nipple. Today it measures 4.0 x 3.6 x 2.6 cm (previously 5.1 x 4.4 x 3.5 cm). Evaluation of the left axilla was performed and no abnormal lymph nodes were identified. IMPRESSION: 1. Interval decrease in size of the patient's known right breast cancer, currently 4.6 x 3.6 x 2.6 cm (previously 5.1 x 4.4 x 3.5 cm). 2. No suspicious right axillary lymphadenopathy on today's exam. RECOMMENDATION: Per clinical treatment plan. I have discussed the findings and recommendations with the patient. If applicable, a reminder letter will be sent to the patient regarding the next appointment. BI-RADS CATEGORY  6: Known biopsy-proven malignancy. Electronically Signed   By: Kristopher Oppenheim M.D.   On: 07/05/2021 15:14  MM DIAG BREAST TOMO UNI RIGHT  Result Date: 07/05/2021 CLINICAL DATA:  61 year old female with known right breast cancer. EXAM: DIGITAL DIAGNOSTIC UNILATERAL RIGHT MAMMOGRAM WITH TOMOSYNTHESIS AND CAD; ULTRASOUND RIGHT BREAST LIMITED TECHNIQUE: Right digital diagnostic mammography and breast tomosynthesis was performed. The images were evaluated with computer-aided detection.; Targeted ultrasound examination of the right breast was performed COMPARISON:  Previous exam(s). ACR Breast Density Category c: The breast tissue is heterogeneously dense, which may obscure small masses. FINDINGS: Hyperdense mass in the upper outer right breast is decreased in size and density when compared to prior examination. Associated post biopsy clips again noted. No new findings in the remainder of the right breast. Targeted ultrasound is performed, showing interval decrease in size of the hypoechoic right breast mass at the 11 o'clock position 1 cm from the nipple. Today it measures 4.0 x 3.6 x 2.6 cm (previously 5.1 x 4.4 x 3.5 cm). Evaluation of the left axilla was performed and no abnormal lymph nodes were identified. IMPRESSION:  1. Interval decrease in size of the patient's known right breast cancer, currently 4.6 x 3.6 x 2.6 cm (previously 5.1 x 4.4 x 3.5 cm). 2. No suspicious right axillary lymphadenopathy on today's exam. RECOMMENDATION: Per clinical treatment plan. I have discussed the findings and recommendations with the patient. If applicable, a reminder letter will be sent to the patient regarding the next appointment. BI-RADS CATEGORY  6: Known biopsy-proven malignancy. Electronically Signed   By: Kristopher Oppenheim M.D.   On: 07/05/2021 15:14  ECHOCARDIOGRAM COMPLETE  Result Date: 07/13/2021    ECHOCARDIOGRAM REPORT   Patient Name:   Yvonne Scott Date of Exam: 07/13/2021 Medical Rec #:  [id number]        Height:       61.5 in Accession #:    [id number]       Weight:       105.0 lb Date of Birth:  02-18-1961       BSA:          1.445 m Patient Age:    1 years         BP:           142/86 mmHg Patient Gender: F                HR:           93 bpm. Exam Location:  [facility] Procedure: 2D Echo, Cardiac Doppler, Color Doppler and Strain Analysis Indications:     Chemo Z09  History:         Patient has prior history of Echocardiogram examinations, most                  recent 02/25/2021. Anxiety, breast cancer.  Sonographer:     Sherrie Sport Referring Phys:  [id number] ZHOU YU Diagnosing Phys: Nelva Bush MD  Sonographer Comments: Suboptimal apical window. Global longitudinal strain was attempted. IMPRESSIONS  1. Left ventricular ejection fraction, by estimation, is 60 to 65%. The left ventricle has normal function. The left ventricle has no regional wall motion abnormalities. Left ventricular diastolic parameters are consistent with Grade I diastolic dysfunction (impaired relaxation). The average left ventricular global longitudinal strain is -13.8 %. The global longitudinal strain is abnormal.  2. Right ventricular systolic function is normal. The right ventricular size is normal.  3. The mitral valve is grossly normal. No evidence of  mitral valve regurgitation.  4. The aortic valve is tricuspid. Aortic valve regurgitation is not visualized. No aortic stenosis is present. FINDINGS  Left Ventricle: Left ventricular ejection fraction, by estimation, is 60 to 65%. The left ventricle has normal function. The left ventricle has no regional wall motion abnormalities. The average left ventricular global longitudinal strain is -13.8 %. The global longitudinal strain is abnormal. The left ventricular internal cavity size was normal in size. There is no left ventricular hypertrophy. Left ventricular diastolic parameters are consistent with Grade I diastolic dysfunction (impaired relaxation). Right Ventricle: The right ventricular size is normal. No increase in right ventricular wall thickness. Right ventricular systolic function is normal. Left Atrium: Left atrial size was normal in size. Right Atrium: Right atrial size was normal in size. Pericardium: There is no evidence of pericardial effusion. Mitral Valve: The mitral valve is grossly normal. No evidence of mitral valve regurgitation. MV peak gradient, 2.7 mmHg. The mean mitral valve gradient is 1.0 mmHg. Tricuspid Valve: The tricuspid valve is normal in structure. Tricuspid valve regurgitation is not demonstrated. Aortic Valve: The aortic valve is tricuspid. Aortic valve regurgitation is not visualized. No aortic stenosis is present. Aortic valve mean gradient measures 2.0 mmHg. Aortic valve peak gradient measures 3.7 mmHg. Aortic valve area, by VTI measures 4.19 cm. Pulmonic Valve: The pulmonic valve was not well visualized. Pulmonic valve regurgitation is not visualized. No evidence of pulmonic stenosis. Aorta: The aortic root is normal in size and structure. Pulmonary Artery: The pulmonary artery is not well seen. IAS/Shunts: No atrial level shunt detected by color flow Doppler.  LEFT VENTRICLE PLAX 2D LVIDd:         3.50 cm   Diastology LVIDs:         2.40 cm   LV e' medial:    6.09 cm/s LV PW:          0.83 cm   LV E/e' medial:  9.8 LV IVS:        0.80 cm   LV e' lateral:   8.16 cm/s LVOT diam:     2.00 cm   LV E/e' lateral: 7.3 LV SV:         60 LV SV Index:   41        2D Longitudinal Strain LVOT Area:     3.14 cm  2D Strain GLS Avg:     -13.8 %                           3D Volume EF:                          3D EF:        61 %  LV EDV:       113 ml                          LV ESV:       44 ml                          LV SV:        69 ml RIGHT VENTRICLE RV Basal diam:  2.40 cm RV S prime:     13.20 cm/s TAPSE (M-mode): 2.0 cm LEFT ATRIUM           Index        RIGHT ATRIUM           Index LA diam:      2.70 cm 1.87 cm/m   RA Area:     12.90 cm LA Vol (A2C): 21.0 ml 14.53 ml/m  RA Volume:   31.10 ml  21.52 ml/m LA Vol (A4C): 19.6 ml 13.56 ml/m  AORTIC VALVE                    PULMONIC VALVE AV Area (Vmax):    3.38 cm     PV Vmax:        0.82 m/s AV Area (Vmean):   3.20 cm     PV Vmean:       54.367 cm/s AV Area (VTI):     4.19 cm     PV VTI:         0.131 m AV Vmax:           96.70 cm/s   PV Peak grad:   2.7 mmHg AV Vmean:          68.400 cm/s  PV Mean grad:   1.3 mmHg AV VTI:            0.143 m      RVOT Peak grad: 3 mmHg AV Peak Grad:      3.7 mmHg AV Mean Grad:      2.0 mmHg LVOT Vmax:         104.00 cm/s LVOT Vmean:        69.700 cm/s LVOT VTI:          0.190 m LVOT/AV VTI ratio: 1.33  AORTA Ao Root diam: 2.90 cm MITRAL VALVE               TRICUSPID VALVE MV Area (PHT): 4.29 cm    TR Peak grad:   7.5 mmHg MV Area VTI:   4.23 cm    TR Vmax:        137.00 cm/s MV Peak grad:  2.7 mmHg MV Mean grad:  1.0 mmHg    SHUNTS MV Vmax:       0.82 m/s    Systemic VTI:  0.19 m MV Vmean:      50.7 cm/s   Systemic Diam: 2.00 cm MV Decel Time: 177 msec    Pulmonic VTI:  0.134 m MV E velocity: 59.60 cm/s MV A velocity: 76.70 cm/s MV E/A ratio:  0.78 Harrell Gave End MD Electronically signed by Nelva Bush MD Signature Date/Time: 07/13/2021/1:42:12 PM    Final       ASSESSMENT &  PLAN:  1. Invasive ductal carcinoma of right breast ([place])   2. Chemotherapy induced diarrhea   3. Chemotherapy induced neutropenia ([facility])   4. Chemotherapy-induced neuropathy ([place])   5. Hypomagnesemia  6. Anxiety associated with cancer diagnosis [facility])    Cancer Staging  Invasive ductal carcinoma of right breast [facility]) Staging form: Breast, AJCC 8th Edition - Clinical stage from 02/19/2021: Stage IIIB (cT3, cN0, cM0, G2, ER-, PR-, HER2-) - Signed by Earlie Server, MD on 02/20/2021  #Clinical stage III triple negative right breast cancer. cT3 cN0-1 Baseline elevated CA 15-3 and CA 27.29. Labs reviewed and discussed with patient  Proceed with [facility] and Keytruda today.  Patient will return on day 3 for Udenyca treatments.  Recommend patient to take Claritin 10 mg daily for 4 days She may take dexamethasone 8 mg daily for 2 days post chemotherapy treatments.  Refills were sent to pharmacy.  #Chemotherapy-induced diarrhea, resolved  # Hypokalemia, continue potassium chloride 16mq BID.   # Hypomagnesia, mag level is improved.,    Continue Slow-Mag increase to 64 mg TID.  #grade 1 chemotherapy-induced neuropathy.  Encourage patient to use gabapentin 100 mg daily.  She may titrate up to 200 or 300 daily if needed  # anxiety, Ativan 0.5 mg Q12h PRN anxiety  All questions were answered. The patient knows to call the clinic with any problems questions or concerns.  cc BDanelle Berry NP    Return of visit:   Follow  up in 1 week for lab NP +/- IV fluid 3 weeks lab MD Adriamycin Cytoxan and Keytruda with USula Rumple MD, PhD  08/03/2021

## 2021-08-03 NOTE — Progress Notes (Signed)
Nutrition Follow-up:  Patient with triple negative breast cancer. Patient receiving doxrubicin, keytruda, cyclophosphamide.  Met with patient during infusion.  Patient reports that diarrhea is better.  Thinks magnesium caused diarrhea.  Appetite is up and down.  Eating cereal, butter noodles, chicken salad sandwich, ice cream, whole milk in cereal. Reports taste alterations.    Medications: reviewed  Labs: reviewed  Anthropometrics:   Weight 103 lb  105 lb on 4/26 101 lb on 4/11 104 lb on 3/28 107 lb on 2/7 109 lb on 1/10  NUTRITION DIAGNOSIS: Inadequate oral intake ongoing   INTERVENTION:  Maximize calories and protein when appetite is good. Monitor diarrhea with adding diary products back in diet    MONITORING, EVALUATION, GOAL: weight trends, intake   NEXT VISIT: Tuesday, June 13 during infusion  Joli B. Allen, RD, LDN Registered Dietitian [phone]   

## 2021-08-03 NOTE — Progress Notes (Signed)
Patient here for follow up. Pt reports that she has been feeling more tired.

## 2021-08-03 NOTE — Patient Instructions (Signed)
[facility] AT [place]  Discharge Instructions: Thank you for choosing [facility] to provide your oncology and hematology care.  If you have a lab appointment with the [facility], please go directly to the [facility] and check in at the registration area.  Wear comfortable clothing and clothing appropriate for easy access to any Portacath or PICC line.   We strive to give you quality time with your provider. You may need to reschedule your appointment if you arrive late (15 or more minutes).  Arriving late affects you and other patients whose appointments are after yours.  Also, if you miss three or more appointments without notifying the office, you may be dismissed from the clinic at the provider's discretion.      For prescription refill requests, have your pharmacy contact our office and allow 72 hours for refills to be completed.    Today you received the following chemotherapy and/or immunotherapy agents Keytruda, Adriamycin, Cytoxan      To help prevent nausea and vomiting after your treatment, we encourage you to take your nausea medication as directed.  BELOW ARE SYMPTOMS THAT SHOULD BE REPORTED IMMEDIATELY: *FEVER GREATER THAN 100.4 F (38 C) OR HIGHER *CHILLS OR SWEATING *NAUSEA AND VOMITING THAT IS NOT CONTROLLED WITH YOUR NAUSEA MEDICATION *UNUSUAL SHORTNESS OF BREATH *UNUSUAL BRUISING OR BLEEDING *URINARY PROBLEMS (pain or burning when urinating, or frequent urination) *BOWEL PROBLEMS (unusual diarrhea, constipation, pain near the anus) TENDERNESS IN MOUTH AND THROAT WITH OR WITHOUT PRESENCE OF ULCERS (sore throat, sores in mouth, or a toothache) UNUSUAL RASH, SWELLING OR PAIN  UNUSUAL VAGINAL DISCHARGE OR ITCHING   Items with * indicate a potential emergency and should be followed up as soon as possible or go to the Emergency Department if any problems should occur.  Please show the CHEMOTHERAPY ALERT CARD or IMMUNOTHERAPY ALERT  CARD at check-in to the Emergency Department and triage nurse.  Should you have questions after your visit or need to cancel or reschedule your appointment, please contact [facility] [place] [facility] [place]  [phone] and follow the prompts.  Office hours are 8:00 a.m. to 4:30 p.m. Monday - Friday. Please note that voicemails left after 4:00 p.m. may not be returned until the following business day.  We are closed weekends and major holidays. You have access to a nurse at all times for urgent questions. Please call the main number to the clinic [phone] and follow the prompts.  For any non-urgent questions, you may also contact your provider using MyChart. We now offer e-Visits for anyone 76 and older to request care online for non-urgent symptoms. For details visit [email or web address].   Also download the MyChart app! Go to the app store, search "MyChart", open the app, select [place], and log in with your MyChart username and password.  Due to Covid, a mask is required upon entering the hospital/clinic. If you do not have a mask, one will be given to you upon arrival. For doctor visits, patients may have 1 support person aged 37 or older with them. For treatment visits, patients cannot have anyone with them due to current Covid guidelines and our immunocompromised population.

## 2021-08-04 ENCOUNTER — Encounter: Payer: Self-pay | Admitting: Oncology

## 2021-08-05 ENCOUNTER — Inpatient Hospital Stay: Payer: Medicaid Other

## 2021-08-05 DIAGNOSIS — C50911 Malignant neoplasm of unspecified site of right female breast: Secondary | ICD-10-CM

## 2021-08-05 DIAGNOSIS — Z5112 Encounter for antineoplastic immunotherapy: Secondary | ICD-10-CM | POA: Diagnosis not present

## 2021-08-05 MED ORDER — PEGFILGRASTIM-CBQV 6 MG/0.6ML ~~LOC~~ SOSY
6.0000 mg | PREFILLED_SYRINGE | Freq: Once | SUBCUTANEOUS | Status: AC
  Administered 2021-08-05: 6 mg via SUBCUTANEOUS
  Filled 2021-08-05: qty 0.6

## 2021-08-13 ENCOUNTER — Encounter: Payer: Self-pay | Admitting: Medical Oncology

## 2021-08-13 ENCOUNTER — Inpatient Hospital Stay: Payer: Medicaid Other | Attending: Oncology

## 2021-08-13 ENCOUNTER — Inpatient Hospital Stay: Payer: Medicaid Other

## 2021-08-13 ENCOUNTER — Inpatient Hospital Stay (HOSPITAL_BASED_OUTPATIENT_CLINIC_OR_DEPARTMENT_OTHER): Payer: Medicaid Other | Admitting: Medical Oncology

## 2021-08-13 VITALS — BP 112/56 | HR 83 | Temp 98.5°F | Resp 16 | Ht 62.0 in | Wt 96.7 lb

## 2021-08-13 VITALS — BP 100/58 | HR 75 | Resp 16

## 2021-08-13 DIAGNOSIS — E876 Hypokalemia: Secondary | ICD-10-CM | POA: Insufficient documentation

## 2021-08-13 DIAGNOSIS — C50911 Malignant neoplasm of unspecified site of right female breast: Secondary | ICD-10-CM | POA: Insufficient documentation

## 2021-08-13 DIAGNOSIS — K1230 Oral mucositis (ulcerative), unspecified: Secondary | ICD-10-CM

## 2021-08-13 DIAGNOSIS — R070 Pain in throat: Secondary | ICD-10-CM | POA: Insufficient documentation

## 2021-08-13 DIAGNOSIS — R634 Abnormal weight loss: Secondary | ICD-10-CM

## 2021-08-13 DIAGNOSIS — Z171 Estrogen receptor negative status [ER-]: Secondary | ICD-10-CM | POA: Diagnosis not present

## 2021-08-13 DIAGNOSIS — F1090 Alcohol use, unspecified, uncomplicated: Secondary | ICD-10-CM | POA: Insufficient documentation

## 2021-08-13 DIAGNOSIS — Z5111 Encounter for antineoplastic chemotherapy: Secondary | ICD-10-CM | POA: Diagnosis present

## 2021-08-13 DIAGNOSIS — E860 Dehydration: Secondary | ICD-10-CM

## 2021-08-13 DIAGNOSIS — F17210 Nicotine dependence, cigarettes, uncomplicated: Secondary | ICD-10-CM | POA: Diagnosis not present

## 2021-08-13 DIAGNOSIS — D6481 Anemia due to antineoplastic chemotherapy: Secondary | ICD-10-CM | POA: Insufficient documentation

## 2021-08-13 DIAGNOSIS — Z79899 Other long term (current) drug therapy: Secondary | ICD-10-CM | POA: Insufficient documentation

## 2021-08-13 DIAGNOSIS — B379 Candidiasis, unspecified: Secondary | ICD-10-CM

## 2021-08-13 DIAGNOSIS — D701 Agranulocytosis secondary to cancer chemotherapy: Secondary | ICD-10-CM | POA: Diagnosis not present

## 2021-08-13 DIAGNOSIS — Z5112 Encounter for antineoplastic immunotherapy: Secondary | ICD-10-CM | POA: Diagnosis present

## 2021-08-13 DIAGNOSIS — K521 Toxic gastroenteritis and colitis: Secondary | ICD-10-CM

## 2021-08-13 DIAGNOSIS — G893 Neoplasm related pain (acute) (chronic): Secondary | ICD-10-CM

## 2021-08-13 DIAGNOSIS — T451X5A Adverse effect of antineoplastic and immunosuppressive drugs, initial encounter: Secondary | ICD-10-CM

## 2021-08-13 LAB — COMPREHENSIVE METABOLIC PANEL
ALT: 11 U/L (ref 0–44)
AST: 14 U/L — ABNORMAL LOW (ref 15–41)
Albumin: 3.8 g/dL (ref 3.5–5.0)
Alkaline Phosphatase: 58 U/L (ref 38–126)
Anion gap: 10 (ref 5–15)
BUN: 17 mg/dL (ref 8–23)
CO2: 22 mmol/L (ref 22–32)
Calcium: 8.9 mg/dL (ref 8.9–10.3)
Chloride: 102 mmol/L (ref 98–111)
Creatinine, Ser: 0.79 mg/dL (ref 0.44–1.00)
GFR, Estimated: >60 mL/min (ref >60)
Glucose, Bld: 86 mg/dL (ref 70–99)
Potassium: 3.3 mmol/L — ABNORMAL LOW (ref 3.5–5.1)
Sodium: 134 mmol/L — ABNORMAL LOW (ref 135–145)
Total Bilirubin: 0.3 mg/dL (ref 0.3–1.2)
Total Protein: 6.7 g/dL (ref 6.5–8.1)

## 2021-08-13 LAB — CBC WITH DIFFERENTIAL/PLATELET
Abs Immature Granulocytes: 0.02 10*3/uL (ref 0.00–0.07)
Basophils Absolute: 0 10*3/uL (ref 0.0–0.1)
Basophils Relative: 1 %
Eosinophils Absolute: 0 10*3/uL (ref 0.0–0.5)
Eosinophils Relative: 0 %
HCT: 27.7 % — ABNORMAL LOW (ref 36.0–46.0)
Hemoglobin: 9.3 g/dL — ABNORMAL LOW (ref 12.0–15.0)
Immature Granulocytes: 1 %
Lymphocytes Relative: 48 %
Lymphs Abs: 1.2 10*3/uL (ref 0.7–4.0)
MCH: 32.7 pg (ref 26.0–34.0)
MCHC: 33.6 g/dL (ref 30.0–36.0)
MCV: 97.5 fL (ref 80.0–100.0)
Monocytes Absolute: 0.3 10*3/uL (ref 0.1–1.0)
Monocytes Relative: 11 %
Neutro Abs: 1 10*3/uL — ABNORMAL LOW (ref 1.7–7.7)
Neutrophils Relative %: 39 %
Platelets: 38 10*3/uL — ABNORMAL LOW (ref 150–400)
RBC: 2.84 MIL/uL — ABNORMAL LOW (ref 3.87–5.11)
RDW: 13.9 % (ref 11.5–15.5)
Smear Review: NORMAL
WBC: 2.4 10*3/uL — ABNORMAL LOW (ref 4.0–10.5)
nRBC: 0 % (ref 0.0–0.2)

## 2021-08-13 LAB — MAGNESIUM: Magnesium: 1.3 mg/dL — ABNORMAL LOW (ref 1.7–2.4)

## 2021-08-13 MED ORDER — NYSTATIN 100000 UNIT/ML MT SUSP: 5.0000 mL | Freq: Four times a day (QID) | OROMUCOSAL | 0 refills | Status: DC

## 2021-08-13 MED ORDER — MAGNESIUM SULFATE 4 GM/100ML IV SOLN
4.0000 g | Freq: Once | INTRAVENOUS | Status: AC
  Administered 2021-08-13: 4 g via INTRAVENOUS
  Filled 2021-08-13: qty 100

## 2021-08-13 MED ORDER — HEPARIN SOD (PORK) LOCK FLUSH 100 UNIT/ML IV SOLN
500.0000 [IU] | Freq: Once | INTRAVENOUS | Status: AC
  Administered 2021-08-13: 500 [IU] via INTRAVENOUS
  Filled 2021-08-13: qty 5

## 2021-08-13 MED ORDER — SODIUM CHLORIDE 0.9 % IV SOLN
Freq: Once | INTRAVENOUS | Status: AC
  Filled 2021-08-13: qty 250

## 2021-08-13 MED ORDER — SODIUM CHLORIDE 0.9% FLUSH
10.0000 mL | Freq: Once | INTRAVENOUS | Status: AC
  Administered 2021-08-13: 10 mL via INTRAVENOUS
  Filled 2021-08-13: qty 10

## 2021-08-13 MED ORDER — HYDROCODONE-ACETAMINOPHEN 5-325 MG PO TABS: 1.0000 | ORAL_TABLET | Freq: Four times a day (QID) | ORAL | 0 refills | Status: DC | PRN

## 2021-08-13 MED ORDER — SUCRALFATE 1 G PO TABS: 1.0000 g | ORAL_TABLET | Freq: Three times a day (TID) | ORAL | 0 refills | Status: DC

## 2021-08-13 MED ORDER — PANTOPRAZOLE SODIUM 40 MG PO TBEC: 40.0000 mg | DELAYED_RELEASE_TABLET | Freq: Every day | ORAL | 0 refills | Status: DC

## 2021-08-13 MED ORDER — NICOTINE 7 MG/24HR TD PT24: 7.0000 mg | MEDICATED_PATCH | Freq: Every day | TRANSDERMAL | 0 refills | Status: DC

## 2021-08-13 MED ORDER — POTASSIUM CHLORIDE 20 MEQ/100ML IV SOLN
20.0000 meq | Freq: Once | INTRAVENOUS | Status: AC
  Administered 2021-08-13: 20 meq via INTRAVENOUS

## 2021-08-13 NOTE — Progress Notes (Signed)
Hematology/Oncology Progress note [phone] [phone]         Patient Care Team: [name], NP as PCP - General (Nurse Practitioner)  REFERRING PROVIDER: [name], NP  CHIEF COMPLAINTS/REASON FOR VISIT:  Follow-up for triple negative right breast cancer.  HISTORY OF PRESENTING ILLNESS:   Yvonne Scott is a  61 y.o.  female with PMH listed below was seen in consultation at the request of  [name], NP  for evaluation of breast cancer.  August 2022, patient was diagnosed with right breast stage IIIb cT3 N0M0, grade 2, ER negative, PR negative HER2 negative breast cancer.  Patient self palpated breast mass many years ago.  10/21/2020 diagnostic mammogram and ultrasound confirmed presence of mass and a subsequent biopsy revealed right breast triple negative cancer. There was plan for patient to start with neoadjuvant chemotherapy followed by surgery and radiation.  Unfortunately patient never started treatment.  Patient was accompanied by her mother.  They want to transfer her oncology care to locally.  Patient reports having right breast pain as well as generalized pain.  Patient had a [name] placed 3 weeks ago and she reports some swelling around the sites.  Patient has had negative genetic testing at [facility].  Patient has a history of alcohol dependence.  Per patient she has not been drinking recently.  Everyday smoker.   02/24/2021, CT chest abdomen pelvis showed large right breast mass, 5.5 x 4.8 cm with small right axillary lymph node with variable degrees of enhancement potentially involved but not specific based on size.  This tracked down to retropectoral nodal stations largest lymph node along the lateral margin of the pectoralis major.  Tiny pulmonary nodules in the chest as discussed.  These are nonspecific but would consider short interval follow-up to assess for any changes.  No evidence of metastatic disease involving the  abdomen or pelvis.  Aortic atherosclerosis.  02/25/2021, echocardiogram showed LVEF 65 to 70%.  Patient has been to chemotherapy class and understands antiemetics instructions.  03/16/2021, bone scan showed no evidence of osseous metastatic breast cancer, asymmetric osseous uptake on the right at L5-S1, corresponding to degenerative disc disease on recent CT.  Asymmetric soft tissue uptake in the right breast.  07/05/2021, right diagnostic mammogram and ultrasound showed interval decrease of the size of the right breast mass, currently 4.6 x 3.6 x 2.6, previously 5.1 x 4.4 x 3.5.  No suspicious right axillary lymphadenopathy on examination.  INTERVAL HISTORY Yvonne Scott is a 61 y.o. female who has above history reviewed by me today presents for follow up visit for management of triple negative breast cancer, stage IIIB 07/16/2021-07/19/2021, patient was admitted for HCAP patient was treated with antibiotics. She followed up for posthospitalization visit and was seen by my colleague Beckey Rutter.    Today she reports that she is feeling well other than not being able to eat due to oral and throat pain secondary to her mucositis from her chemotherapy. Some dental pain as well from lesions being close to the gums/teeth. Has a dentist. She has lost a significant amount of weight due to this. She does not want to give up on treatment but she says that she is close if symptoms do not improve. She has already tried magic mouthwash, omeprazole with little improvement. Working with nutrition. Not drinking alcohol. Smoking about 4-7 cigarettes per day but is interested in stopping.     Review of Systems  Constitutional:  Positive for fatigue. Negative  for appetite change, chills and fever.  HENT:   Negative for hearing loss, mouth sores and voice change.   Eyes:  Negative for eye problems.  Respiratory:  Negative for chest tightness and cough.   Cardiovascular:  Negative for chest pain.  Gastrointestinal:   Negative for abdominal distention, abdominal pain, blood in stool, diarrhea and nausea.  Endocrine: Negative for hot flashes.  Genitourinary:  Negative for difficulty urinating, dysuria and frequency.   Musculoskeletal:  Negative for arthralgias.  Skin:  Negative for itching and rash.  Neurological:  Positive for numbness. Negative for extremity weakness.  Hematological:  Negative for adenopathy.  Psychiatric/Behavioral:  Negative for confusion. The patient is not nervous/anxious.    MEDICAL HISTORY:  Past Medical History:  Diagnosis Date   Anxiety    Aortic atherosclerosis ([place]) 10/24/2016   Breast cancer ([facility])    GERD (gastroesophageal reflux disease)    Personal history of chemotherapy     SURGICAL HISTORY: Past Surgical History:  Procedure Laterality Date   BREAST BIOPSY      SOCIAL HISTORY: Social History   Socioeconomic History   Marital status: Widowed    Spouse name: Not on file   Number of children: Not on file   Years of education: Not on file   Highest education level: Not on file  Occupational History   Not on file  Tobacco Use   Smoking status: Every Day    Packs/day: 1.00    Years: 36.00    Pack years: 36.00    Types: Cigarettes    Start date: 03/15/1979   Smokeless tobacco: Never  Vaping Use   Vaping Use: Never used  Substance and Sexual Activity   Alcohol use: No    Alcohol/week: 3.0 standard drinks    Types: 3 Standard drinks or equivalent per week   Drug use: Not Currently   Sexual activity: Not Currently    Birth control/protection: None  Other Topics Concern   Not on file  Social History Narrative   ** Merged History Encounter **       Social Determinants of Health   Financial Resource Strain: Not on file  Food Insecurity: Not on file  Transportation Needs: Not on file  Physical Activity: Not on file  Stress: Not on file  Social Connections: Not on file  Intimate Partner Violence: Not on file    FAMILY HISTORY: Family History   Problem Relation Age of Onset   Cancer Mother 38       breast   Cancer Father        melonma   Cirrhosis Maternal Grandfather    Cancer Paternal Grandmother        skin/breast    ALLERGIES:  has No Known Allergies.  MEDICATIONS:  Current Outpatient Medications  Medication Sig Dispense Refill   calcium-vitamin D (OSCAL WITH D) 500-5 MG-MCG tablet Take 2 tablets by mouth daily. 60 tablet 5   dexamethasone (DECADRON) 4 MG tablet TAKE 2 TABLETS BY MOUTH ONCE DAILY FOR 2 DAYS AFTER  CHEMOTHERAPY.  TAKE  WITH  FOOD. 15 tablet 0   dextromethorphan-guaiFENesin (MUCINEX DM) 30-600 MG 12hr tablet Take 1 tablet by mouth 2 (two) times daily as needed for cough. 30 tablet 1   folic acid (FOLVITE) 1 MG tablet Take 1 tablet (1 mg total) by mouth daily. 30 tablet 0   gabapentin (NEURONTIN) 100 MG capsule Take 1 capsule (100 mg total) by mouth 3 (three) times daily. 90 capsule 0   HYDROcodone-acetaminophen (NORCO)  5-325 MG tablet Take 1 tablet by mouth every 6 (six) hours as needed for moderate pain. 30 tablet 0   ibuprofen (ADVIL) 200 MG tablet Take 200 mg by mouth every 6 (six) hours as needed for headache.     lidocaine-prilocaine (EMLA) cream Apply to affected area once 30 g 3   LORazepam (ATIVAN) 0.5 MG tablet Take 1 tablet (0.5 mg total) by mouth every 12 (twelve) hours as needed for anxiety (nausea). 15 tablet 0   magic mouthwash (multi-ingredient) oral suspension Take 5 mLs by mouth 4 (four) times daily as needed for mouth pain. 480 mL 1   magnesium chloride (SLOW-MAG) 64 MG TBEC SR tablet Take 1 tablet (64 mg total) by mouth 2 (two) times daily. 60 tablet 2   Multiple Vitamin (MULTIVITAMIN WITH MINERALS) TABS tablet Take 1 tablet by mouth daily. 30 tablet 0   nicotine (NICODERM CQ - DOSED IN MG/24 HR) 7 mg/24hr patch Place 1 patch (7 mg total) onto the skin daily. 28 patch 0   nystatin (MYCOSTATIN) 100000 UNIT/ML suspension Take 5 mLs (500,000 Units total) by mouth 4 (four) times daily. 60 mL 0    ondansetron (ZOFRAN) 8 MG tablet TAKE 1 TABLET BY MOUTH TWICE DAILY AS NEEDED. START THE THIRD DAY AFTER CARBOPLATIN AND AC CHEMOTHERAPY. 30 tablet 0   pantoprazole (PROTONIX) 40 MG tablet Take 1 tablet (40 mg total) by mouth daily. 30 tablet 0   potassium chloride SA (KLOR-CON M) 20 MEQ tablet Take 1 tablet (20 mEq total) by mouth 2 (two) times daily. 60 tablet 1   prochlorperazine (COMPAZINE) 10 MG tablet Take 1 tablet (10 mg total) by mouth every 6 (six) hours as needed (Nausea or vomiting). 30 tablet 1   sucralfate (CARAFATE) 1 g tablet Take 1 tablet (1 g total) by mouth 4 (four) times daily -  with meals and at bedtime. 120 tablet 0   thiamine 100 MG tablet Take 0.5 tablets (50 mg total) by mouth daily. 15 tablet 0   VENTOLIN HFA 108 (90 Base) MCG/ACT inhaler Inhale 2 puffs into the lungs 4 (four) times daily as needed.     diphenoxylate-atropine (LOMOTIL) 2.5-0.025 MG tablet Take 1 tablet by mouth 4 (four) times daily as needed for diarrhea or loose stools. (Patient not taking: Reported on 07/22/2021) 30 tablet 0   fluconazole (DIFLUCAN) 150 MG tablet Take 1 tablet (150 mg total) by mouth See admin instructions. Take one tablet, and repeat the second dose in 3 days. (Patient not taking: Reported on 06/01/2021) 2 tablet 0   loperamide (IMODIUM) 2 MG capsule Take 1 capsule (2 mg total) by mouth See admin instructions. Initial: 4 mg, followed by 2 mg after each loose stool; maximum: 16 mg/day (Patient not taking: Reported on 08/03/2021) 90 capsule 1   No current facility-administered medications for this visit.   Facility-Administered Medications Ordered in Other Visits  Medication Dose Route Frequency Provider Last Rate Last Admin   heparin lock flush 100 unit/mL  500 Units Intravenous Once Delisa Finck M, PA-C       magnesium sulfate IVPB 4 g 100 mL  4 g Intravenous Once Hughie Closs, PA-C 50 mL/hr at 08/13/21 1024 4 g at 08/13/21 1024   potassium chloride 20 mEq in 100 mL IVPB  20  mEq Intravenous Once Hughie Closs, PA-C 100 mL/hr at 08/13/21 1018 20 mEq at 08/13/21 1018     PHYSICAL EXAMINATION: ECOG PERFORMANCE STATUS: 1 - Symptomatic but completely ambulatory Vitals:  08/13/21 0918  BP: (!) 112/56  Pulse: 83  Resp: 16  Temp: 98.5 F (36.9 C)  SpO2: 99%   Filed Weights   08/13/21 0918 08/13/21 0920  Weight: 96 lb 11.2 oz (43.9 kg) 96 lb 11.2 oz (43.9 kg)    Physical Exam Constitutional:      General: She is not in acute distress. HENT:     Head: Normocephalic and atraumatic.     Mouth/Throat:     Comments: Scattered sores of the mouth. Fair dentition. No visible dental abscesses  Eyes:     General: No scleral icterus. Cardiovascular:     Rate and Rhythm: Normal rate and regular rhythm.     Heart sounds: Normal heart sounds.  Pulmonary:     Effort: Pulmonary effort is normal. No respiratory distress.     Breath sounds: No wheezing.  Abdominal:     General: Bowel sounds are normal. There is no distension.     Palpations: Abdomen is soft.  Musculoskeletal:        General: No deformity. Normal range of motion.     Cervical back: Normal range of motion and neck supple.  Skin:    General: Skin is warm and dry.     Findings: No erythema or rash.  Neurological:     Mental Status: She is alert and oriented to person, place, and time. Mental status is at baseline.     Cranial Nerves: No cranial nerve deficit.     Coordination: Coordination normal.  Psychiatric:        Mood and Affect: Mood normal.   Right breast mass has decreased in size  LABORATORY DATA:  I have reviewed the data as listed Lab Results  Component Value Date   WBC 2.4 (L) 08/13/2021   HGB 9.3 (L) 08/13/2021   HCT 27.7 (L) 08/13/2021   MCV 97.5 08/13/2021   PLT 38 (L) 08/13/2021   Recent Labs    07/29/21 0752 08/03/21 0808 08/13/21 0940  NA 138 136 134*  K 3.9 3.6 3.3*  CL 103 104 102  CO2 _0 GLUCOSE 99 89 86  BUN _1 CREATININE 0.65 0.65 0.79   CALCIUM 9.1 8.5* 8.9  GFRNONAA >60 >60 >60  PROT 6.5 6.9 6.7  ALBUMIN 3.3* 3.5 3.8  AST 16 16 14*  ALT _2 ALKPHOS 66 62 58  BILITOT 0.4 0.3 0.3    Iron/TIBC/Ferritin/ %Sat    Component Value Date/Time   IRON 80 10/24/2016 1534   TIBC 282 10/24/2016 1534   FERRITIN 282 (H) 11/17/2016 1404   IRONPCTSAT 28 10/24/2016 1534       RADIOGRAPHIC STUDIES: I have personally reviewed the radiological images as listed and agreed with the findings in the report. DG Chest 2 View  Result Date: 07/16/2021 CLINICAL DATA:  Shortness of breath.  Cough. EXAM: CHEST - 2 VIEW COMPARISON:  Jul 14, 2021. FINDINGS: The heart size and mediastinal contours are within normal limits. Stable position of left internal jugular Port-A-Cath. Hyperexpansion of the lungs is noted. Right lung is clear. Minimal left midlung subsegmental atelectasis is noted. The visualized skeletal structures are unremarkable. IMPRESSION: Hyperexpansion of the lungs is noted. Minimal left midlung subsegmental atelectasis is noted. Electronically Signed   By: Marijo Conception M.D.   On: 07/16/2021 12:18   DG Chest 2 View  Result Date: 07/14/2021 CLINICAL DATA:  Chest congestion history of breast carcinoma EXAM: CHEST - 2 VIEW COMPARISON:  04/29/2021 FINDINGS: Cardiac size is within normal limits. Increase in AP diameter of chest suggests COPD. Lung fields are clear of any infiltrates or pulmonary edema. There is no pleural effusion or pneumothorax. Tip of left IJ chest port is seen in the superior vena cava close to the right atrium. IMPRESSION: No active cardiopulmonary disease. Electronically Signed   By: Elmer Picker M.D.   On: 07/14/2021 14:43   CT Angio Chest PE W and/or Wo Contrast  Result Date: 07/16/2021 CLINICAL DATA:  Pulmonary embolism (PE) suspected, positive D-dimer EXAM: CT ANGIOGRAPHY CHEST WITH CONTRAST TECHNIQUE: Multidetector CT imaging of the chest was performed using the standard protocol during bolus  administration of intravenous contrast. Multiplanar CT image reconstructions and MIPs were obtained to evaluate the vascular anatomy. RADIATION DOSE REDUCTION: This exam was performed according to the departmental dose-optimization program which includes automated exposure control, adjustment of the mA and/or kV according to patient size and/or use of iterative reconstruction technique. CONTRAST:  51m OMNIPAQUE IOHEXOL 350 MG/ML SOLN COMPARISON:  CT chest 02/24/2021.  Same day chest x-ray. FINDINGS: Cardiovascular: Satisfactory opacification of the pulmonary arteries to the segmental level. No evidence of pulmonary embolism. Normal heart size. No pericardial effusion. Aortic atherosclerosis coronary artery atherosclerosis. Mediastinum/Nodes: No enlarged mediastinal, hilar, or axillary lymph nodes. Previously seen right axillary lymph nodes are decreased. Thyroid gland, trachea, and esophagus demonstrate no significant findings. Lungs/Pleura: Emphysema. Small area of ground-glass opacification in the anterior aspect of the right upper lobe. Similar 2-3 mm pulmonary nodule in the right upper lobe. Mild ground-glass opacities in the dependent lungs bilaterally, probably atelectasis. No pleural effusion or pneumothorax. Upper Abdomen: No acute abnormality. Musculoskeletal: Decreased size of the right breast mass, now measuring 4.3 x 2.3 cm Review of the MIP images confirms the above findings. IMPRESSION: 1. No evidence of acute pulmonary embolism. 2. Small focus of ground-glass opacification in the anterior aspect of the right upper lobe may be infectious or inflammatory. 3. Mild dependent atelectasis. 4. Aortic Atherosclerosis (ICD10-I70.0) and Emphysema (ICD10-J43.9). 5. Decreased size of the right breast mass, now measuring 4.3 x 2.3 cm. Improved right axillary adenopathy. Electronically Signed   By: FMargaretha SheffieldM.D.   On: 07/16/2021 14:18   UKoreaVenous Img Lower Bilateral (DVT)  Result Date:  07/17/2021 CLINICAL DATA:  61year old female with history of positive D-dimer. Evaluate for deep venous thrombosis. EXAM: BILATERAL LOWER EXTREMITY VENOUS DOPPLER ULTRASOUND TECHNIQUE: Gray-scale sonography with graded compression, as well as color Doppler and duplex ultrasound were performed to evaluate the lower extremity deep venous systems from the level of the common femoral vein and including the common femoral, femoral, profunda femoral, popliteal and calf veins including the posterior tibial, peroneal and gastrocnemius veins when visible. The superficial great saphenous vein was also interrogated. Spectral Doppler was utilized to evaluate flow at rest and with distal augmentation maneuvers in the common femoral, femoral and popliteal veins. COMPARISON:  None Available. FINDINGS: RIGHT LOWER EXTREMITY Common Femoral Vein: No evidence of thrombus. Normal compressibility, respiratory phasicity and response to augmentation. Saphenofemoral Junction: No evidence of thrombus. Normal compressibility and flow on color Doppler imaging. Profunda Femoral Vein: No evidence of thrombus. Normal compressibility and flow on color Doppler imaging. Femoral Vein: No evidence of thrombus. Normal compressibility, respiratory phasicity and response to augmentation. Popliteal Vein: No evidence of thrombus. Normal compressibility, respiratory phasicity and response to augmentation. Calf Veins: No evidence of thrombus. Normal compressibility and flow on color Doppler imaging. Superficial Great Saphenous Vein: No evidence of thrombus. Normal  compressibility. Venous Reflux:  None. Other Findings:  None. LEFT LOWER EXTREMITY Common Femoral Vein: No evidence of thrombus. Normal compressibility, respiratory phasicity and response to augmentation. Saphenofemoral Junction: No evidence of thrombus. Normal compressibility and flow on color Doppler imaging. Profunda Femoral Vein: No evidence of thrombus. Normal compressibility and flow on  color Doppler imaging. Femoral Vein: No evidence of thrombus. Normal compressibility, respiratory phasicity and response to augmentation. Popliteal Vein: No evidence of thrombus. Normal compressibility, respiratory phasicity and response to augmentation. Calf Veins: No evidence of thrombus. Normal compressibility and flow on color Doppler imaging. Superficial Great Saphenous Vein: No evidence of thrombus. Normal compressibility. Venous Reflux:  None. Other Findings:  None. IMPRESSION: No evidence of deep venous thrombosis in either lower extremity. Electronically Signed   By: Vinnie Langton M.D.   On: 07/17/2021 07:05      ASSESSMENT & PLAN:  1. Invasive ductal carcinoma of right breast ([place])    Cancer Staging  Invasive ductal carcinoma of right breast [facility]) Staging form: Breast, AJCC 8th Edition - Clinical stage from 02/19/2021: Stage IIIB (cT3, cN0, cM0, G2, ER-, PR-, HER2-) - Signed by Earlie Server, MD on 02/20/2021  #Clinical stage III triple negative right breast cancer. cT3 cN0-1 Baseline elevated CA 15-3 and CA 27.29. Labs reviewed and discussed with patient  IV magnesium and Potassium today along with 1L IVF Goal is to get patient more comfortable so she can eat- changing Omeprazole to high dose Protonix, adding sucralfate, prescribing nicotine patches to reduce irritation of oral mucosa, Norco for pain PRN, nystatin to treat any fungal component.  Follow up next week with me, labs and fluids.   #Chemotherapy-induced diarrhea, resolved  # Hypokalemia, continue potassium chloride 37mq BID.   # Hypomagnesia, SIV supplementation given today   Continue Slow-Mag increase to 64 mg TID.  #grade 1 chemotherapy-induced neuropathy.  Encourage patient to use gabapentin 100 mg daily.  She may titrate up to 200 or 300 daily if needed  # anxiety, Ativan 0.5 mg Q12h PRN anxiety- not currently using. She will not use with Norco.   All questions were answered. The patient knows to call the clinic  with any problems questions or concerns.  cc BDanelle Berry NP    Return of visit:   RTC next week with me plus labs (cbc, cmp, mag) +fluids 2 weeks lab MD consideration of Adriamycin Cytoxan and Keytruda with URobina AdePA-C  08/13/2021

## 2021-08-13 NOTE — Progress Notes (Signed)
Received IV hydration + magnesium and potassium. Tolerated well. VSS. Discharged to home.

## 2021-08-17 ENCOUNTER — Other Ambulatory Visit: Payer: Self-pay | Admitting: Oncology

## 2021-08-17 DIAGNOSIS — C50911 Malignant neoplasm of unspecified site of right female breast: Secondary | ICD-10-CM

## 2021-08-18 ENCOUNTER — Inpatient Hospital Stay: Payer: Medicaid Other

## 2021-08-18 ENCOUNTER — Inpatient Hospital Stay (HOSPITAL_BASED_OUTPATIENT_CLINIC_OR_DEPARTMENT_OTHER): Payer: Medicaid Other | Admitting: Nurse Practitioner

## 2021-08-18 ENCOUNTER — Encounter: Payer: Self-pay | Admitting: Oncology

## 2021-08-18 ENCOUNTER — Encounter: Payer: Self-pay | Admitting: Nurse Practitioner

## 2021-08-18 VITALS — BP 122/68 | HR 90 | Temp 99.3°F | Resp 17 | Wt 99.4 lb

## 2021-08-18 VITALS — BP 110/70 | HR 83 | Temp 98.0°F | Resp 16

## 2021-08-18 DIAGNOSIS — R12 Heartburn: Secondary | ICD-10-CM

## 2021-08-18 DIAGNOSIS — K1230 Oral mucositis (ulcerative), unspecified: Secondary | ICD-10-CM | POA: Diagnosis not present

## 2021-08-18 DIAGNOSIS — F419 Anxiety disorder, unspecified: Secondary | ICD-10-CM

## 2021-08-18 DIAGNOSIS — E876 Hypokalemia: Secondary | ICD-10-CM

## 2021-08-18 DIAGNOSIS — E860 Dehydration: Secondary | ICD-10-CM

## 2021-08-18 DIAGNOSIS — D6481 Anemia due to antineoplastic chemotherapy: Secondary | ICD-10-CM

## 2021-08-18 DIAGNOSIS — Z5112 Encounter for antineoplastic immunotherapy: Secondary | ICD-10-CM | POA: Diagnosis not present

## 2021-08-18 DIAGNOSIS — C50911 Malignant neoplasm of unspecified site of right female breast: Secondary | ICD-10-CM

## 2021-08-18 LAB — CBC WITH DIFFERENTIAL/PLATELET
Abs Immature Granulocytes: 1.14 10*3/uL — ABNORMAL HIGH (ref 0.00–0.07)
Basophils Absolute: 0.1 10*3/uL (ref 0.0–0.1)
Basophils Relative: 0 %
Eosinophils Absolute: 0 10*3/uL (ref 0.0–0.5)
Eosinophils Relative: 0 %
HCT: 25.8 % — ABNORMAL LOW (ref 36.0–46.0)
Hemoglobin: 8.5 g/dL — ABNORMAL LOW (ref 12.0–15.0)
Immature Granulocytes: 7 %
Lymphocytes Relative: 20 %
Lymphs Abs: 3.2 10*3/uL (ref 0.7–4.0)
MCH: 33.2 pg (ref 26.0–34.0)
MCHC: 32.9 g/dL (ref 30.0–36.0)
MCV: 100.8 fL — ABNORMAL HIGH (ref 80.0–100.0)
Monocytes Absolute: 1.5 10*3/uL — ABNORMAL HIGH (ref 0.1–1.0)
Monocytes Relative: 9 %
Neutro Abs: 10 10*3/uL — ABNORMAL HIGH (ref 1.7–7.7)
Neutrophils Relative %: 64 %
Platelets: 131 10*3/uL — ABNORMAL LOW (ref 150–400)
RBC: 2.56 MIL/uL — ABNORMAL LOW (ref 3.87–5.11)
RDW: 14.6 % (ref 11.5–15.5)
WBC: 15.9 10*3/uL — ABNORMAL HIGH (ref 4.0–10.5)
nRBC: 0.1 % (ref 0.0–0.2)

## 2021-08-18 LAB — MAGNESIUM: Magnesium: 1.2 mg/dL — ABNORMAL LOW (ref 1.7–2.4)

## 2021-08-18 LAB — IRON AND TIBC
Iron: 40 ug/dL (ref 28–170)
Saturation Ratios: 17 % (ref 10.4–31.8)
TIBC: 234 ug/dL — ABNORMAL LOW (ref 250–450)
UIBC: 194 ug/dL

## 2021-08-18 LAB — COMPREHENSIVE METABOLIC PANEL
ALT: 9 U/L (ref 0–44)
AST: 17 U/L (ref 15–41)
Albumin: 3.5 g/dL (ref 3.5–5.0)
Alkaline Phosphatase: 66 U/L (ref 38–126)
Anion gap: 8 (ref 5–15)
BUN: 9 mg/dL (ref 8–23)
CO2: 22 mmol/L (ref 22–32)
Calcium: 8.8 mg/dL — ABNORMAL LOW (ref 8.9–10.3)
Chloride: 103 mmol/L (ref 98–111)
Creatinine, Ser: 0.66 mg/dL (ref 0.44–1.00)
GFR, Estimated: >60 mL/min (ref >60)
Glucose, Bld: 90 mg/dL (ref 70–99)
Potassium: 3.3 mmol/L — ABNORMAL LOW (ref 3.5–5.1)
Sodium: 133 mmol/L — ABNORMAL LOW (ref 135–145)
Total Bilirubin: <0.1 mg/dL — ABNORMAL LOW (ref 0.3–1.2)
Total Protein: 6 g/dL — ABNORMAL LOW (ref 6.5–8.1)

## 2021-08-18 LAB — VITAMIN B12: Vitamin B-12: 2341 pg/mL — ABNORMAL HIGH (ref 180–914)

## 2021-08-18 LAB — FERRITIN: Ferritin: 202 ng/mL (ref 11–307)

## 2021-08-18 LAB — FOLATE: Folate: 3.5 ng/mL — ABNORMAL LOW (ref >5.9)

## 2021-08-18 MED ORDER — SODIUM CHLORIDE 0.9% FLUSH
10.0000 mL | Freq: Once | INTRAVENOUS | Status: AC
  Administered 2021-08-18: 10 mL via INTRAVENOUS
  Filled 2021-08-18: qty 10

## 2021-08-18 MED ORDER — HEPARIN SOD (PORK) LOCK FLUSH 100 UNIT/ML IV SOLN
500.0000 [IU] | Freq: Once | INTRAVENOUS | Status: AC
  Administered 2021-08-18: 500 [IU] via INTRAVENOUS
  Filled 2021-08-18: qty 5

## 2021-08-18 MED ORDER — POTASSIUM CHLORIDE 20 MEQ/100ML IV SOLN
20.0000 meq | Freq: Once | INTRAVENOUS | Status: AC
  Administered 2021-08-18: 20 meq via INTRAVENOUS

## 2021-08-18 MED ORDER — MAGNESIUM SULFATE 2 GM/50ML IV SOLN
2.0000 g | Freq: Once | INTRAVENOUS | Status: AC
  Administered 2021-08-18: 2 g via INTRAVENOUS
  Filled 2021-08-18: qty 50

## 2021-08-18 MED ORDER — SODIUM CHLORIDE 0.9 % IV SOLN
Freq: Once | INTRAVENOUS | Status: AC
  Filled 2021-08-18: qty 250

## 2021-08-18 MED ORDER — POTASSIUM CHLORIDE CRYS ER 20 MEQ PO TBCR
20.0000 meq | EXTENDED_RELEASE_TABLET | Freq: Three times a day (TID) | ORAL | 1 refills | Status: DC
Start: 2021-08-18 — End: 2021-09-08

## 2021-08-18 MED ORDER — FAMOTIDINE IN NACL 20-0.9 MG/50ML-% IV SOLN
20.0000 mg | Freq: Once | INTRAVENOUS | Status: AC
  Administered 2021-08-18: 20 mg via INTRAVENOUS

## 2021-08-18 MED ORDER — DULOXETINE HCL 30 MG PO CPEP: 30.0000 mg | ORAL_CAPSULE | Freq: Every day | ORAL | 2 refills | Status: DC

## 2021-08-18 MED ORDER — LORAZEPAM 0.5 MG PO TABS: 0.5000 mg | ORAL_TABLET | Freq: Two times a day (BID) | ORAL | 0 refills | Status: DC | PRN

## 2021-08-18 MED ORDER — FAMOTIDINE IN NACL 20-0.9 MG/50ML-% IV SOLN
INTRAVENOUS | Status: AC
  Filled 2021-08-18: qty 50

## 2021-08-18 MED ORDER — LORAZEPAM 2 MG/ML IJ SOLN
0.5000 mg | Freq: Once | INTRAMUSCULAR | Status: AC
  Administered 2021-08-18: 0.5 mg via INTRAVENOUS
  Filled 2021-08-18: qty 1

## 2021-08-18 NOTE — Progress Notes (Signed)
Pt received IVF's supportive meds and electrolytes. Pt feeling relaxed at time of discharge. VSS.

## 2021-08-18 NOTE — Progress Notes (Signed)
Pt reports feeling better over all. Does have a appetite but has a lot of epigastric pain and discomfort when she swallows foods and coffee. Denies N/V diarrhea or constipation today.

## 2021-08-18 NOTE — Progress Notes (Signed)
Hematology/Oncology Progress Note [phone] [phone]  Patient Care Team: [name], NP as PCP - General (Nurse Practitioner)  REFERRING PROVIDER: [name], NP   CHIEF COMPLAINTS/REASON FOR VISIT:  Follow-up for triple negative right breast cancer.  HISTORY OF PRESENTING ILLNESS:  Yvonne Scott is a  61 y.o.  female with PMH listed below was seen in consultation at the request of  [name], NP  for evaluation of breast cancer.  August 2022, patient was diagnosed with right breast stage IIIb cT3 N0M0, grade 2, ER negative, PR negative HER2 negative breast cancer.  Patient self palpated breast mass many years ago.  10/21/2020 diagnostic mammogram and ultrasound confirmed presence of mass and a subsequent biopsy revealed right breast triple negative cancer. There was plan for patient to start with neoadjuvant chemotherapy followed by surgery and radiation.  Unfortunately patient never started treatment.  Patient was accompanied by her mother.  They want to transfer her oncology care to locally.  Patient reports having right breast pain as well as generalized pain.  Patient had a [name] placed 3 weeks ago and she reports some swelling around the sites.  Patient has had negative genetic testing at [facility].  Patient has a history of alcohol dependence.  Per patient she has not been drinking recently.  Everyday smoker.  02/24/2021, CT chest abdomen pelvis showed large right breast mass, 5.5 x 4.8 cm with small right axillary lymph node with variable degrees of enhancement potentially involved but not specific based on size.  This tracked down to retropectoral nodal stations largest lymph node along the lateral margin of the pectoralis major.  Tiny pulmonary nodules in the chest as discussed.  These are nonspecific but would consider short interval follow-up to assess for any changes.  No evidence of metastatic disease involving the abdomen or pelvis.   Aortic atherosclerosis.  02/25/2021, echocardiogram showed LVEF 65 to 70%.  Patient has been to chemotherapy class and understands antiemetics instructions.  03/16/2021, bone scan showed no evidence of osseous metastatic breast cancer, asymmetric osseous uptake on the right at L5-S1, corresponding to degenerative disc disease on recent CT.  Asymmetric soft tissue uptake in the right breast.  07/05/2021, right diagnostic mammogram and ultrasound showed interval decrease of the size of the right breast mass, currently 4.6 x 3.6 x 2.6, previously 5.1 x 4.4 x 3.5.  No suspicious right axillary lymphadenopathy on examination.  INTERVAL HISTORY: Yvonne Scott is a 61 y.o. female with above history of triple negative breast cancer, stage IIIB, currently receiving AC-Keytruda chemotherapy who returns to clinic for supportive care and assessment after treatment. She reports worsening anxiety associated with her cancer diagnosis and treatment. Tearful and worried at times. Reduces her appetite. Worrieda bout her weight loss. Experienced constipation earlier this week that has now resolved. She has been taking magnesium intermittently. Takes potassium as prescribed. No diarrhea. Nausea is stable. She has ongoing fatigue but this is stable and unchanged.    Review of Systems  Constitutional:  Positive for fatigue and unexpected weight change (weight loss). Negative for appetite change, chills and fever.  HENT:   Negative for hearing loss, mouth sores and voice change.   Eyes:  Negative for eye problems.  Respiratory:  Negative for chest tightness, cough and shortness of breath.   Cardiovascular:  Negative for chest pain.  Gastrointestinal:  Positive for constipation. Negative for abdominal distention, abdominal pain, blood in stool, diarrhea, nausea and vomiting.  Endocrine: Negative for hot flashes.  Genitourinary:  Negative for difficulty urinating, dysuria and frequency.   Musculoskeletal:  Negative for  arthralgias.  Skin:  Negative for itching and rash.  Neurological:  Positive for numbness. Negative for extremity weakness.  Hematological:  Negative for adenopathy. Does not bruise/bleed easily.  Psychiatric/Behavioral:  Positive for depression and sleep disturbance. Negative for confusion. The patient is nervous/anxious.    MEDICAL HISTORY:  Past Medical History:  Diagnosis Date   Anxiety    Aortic atherosclerosis ([place]) 10/24/2016   Breast cancer (HCC)    GERD (gastroesophageal reflux disease)    Personal history of chemotherapy     SURGICAL HISTORY: Past Surgical History:  Procedure Laterality Date   BREAST BIOPSY      SOCIAL HISTORY: Social History   Socioeconomic History   Marital status: Widowed    Spouse name: Not on file   Number of children: Not on file   Years of education: Not on file   Highest education level: Not on file  Occupational History   Not on file  Tobacco Use   Smoking status: Every Day    Packs/day: 1.00    Years: 36.00    Pack years: 36.00    Types: Cigarettes    Start date: 03/15/1979   Smokeless tobacco: Never  Vaping Use   Vaping Use: Never used  Substance and Sexual Activity   Alcohol use: No    Alcohol/week: 3.0 standard drinks    Types: 3 Standard drinks or equivalent per week   Drug use: Not Currently   Sexual activity: Not Currently    Birth control/protection: None  Other Topics Concern   Not on file  Social History Narrative   ** Merged History Encounter **       Social Determinants of Health   Financial Resource Strain: Not on file  Food Insecurity: Not on file  Transportation Needs: Not on file  Physical Activity: Not on file  Stress: Not on file  Social Connections: Not on file  Intimate Partner Violence: Not on file    FAMILY HISTORY: Family History  Problem Relation Age of Onset   Cancer Mother 28       breast   Cancer Father        melonma   Cirrhosis Maternal Grandfather    Cancer Paternal Grandmother         skin/breast    ALLERGIES:  has No Known Allergies.  MEDICATIONS:  Current Outpatient Medications  Medication Sig Dispense Refill   calcium carbonate (TUMS EX) 750 MG chewable tablet Chew 2 tablets by mouth 4 (four) times daily.     calcium-vitamin D (OSCAL WITH D) 500-5 MG-MCG tablet Take 2 tablets by mouth daily. 60 tablet 5   dexamethasone (DECADRON) 4 MG tablet TAKE 2 TABLETS BY MOUTH ONCE DAILY FOR 2 DAYS AFTER  CHEMOTHERAPY.  TAKE  WITH  FOOD. 15 tablet 0   dextromethorphan-guaiFENesin (MUCINEX DM) 30-600 MG 12hr tablet Take 1 tablet by mouth 2 (two) times daily as needed for cough. 30 tablet 1   folic acid (FOLVITE) 1 MG tablet Take 1 tablet (1 mg total) by mouth daily. 30 tablet 0   ibuprofen (ADVIL) 200 MG tablet Take 200 mg by mouth every 6 (six) hours as needed for headache.     lidocaine-prilocaine (EMLA) cream Apply to affected area once 30 g 3   magic mouthwash (multi-ingredient) oral suspension Take 5 mLs by mouth 4 (four) times daily as needed for mouth pain. 480 mL 1   magnesium  chloride (SLOW-MAG) 64 MG TBEC SR tablet Take 1 tablet (64 mg total) by mouth 2 (two) times daily. 60 tablet 2   Multiple Vitamin (MULTIVITAMIN WITH MINERALS) TABS tablet Take 1 tablet by mouth daily. 30 tablet 0   nystatin (MYCOSTATIN) 100000 UNIT/ML suspension Take 5 mLs (500,000 Units total) by mouth 4 (four) times daily. 60 mL 0   ondansetron (ZOFRAN) 8 MG tablet TAKE 1 TABLET BY MOUTH TWICE DAILY AS NEEDED -  START  THE  3RD  DAY  AFTER  CARBOPLATIN  AND  AC  CHEMOTHERAPY 90 tablet 0   pantoprazole (PROTONIX) 40 MG tablet Take 1 tablet (40 mg total) by mouth daily. 30 tablet 0   potassium chloride SA (KLOR-CON M) 20 MEQ tablet Take 1 tablet (20 mEq total) by mouth 2 (two) times daily. 60 tablet 1   prochlorperazine (COMPAZINE) 10 MG tablet Take 1 tablet (10 mg total) by mouth every 6 (six) hours as needed (Nausea or vomiting). 30 tablet 1   sucralfate (CARAFATE) 1 g tablet Take 1 tablet (1 g  total) by mouth 4 (four) times daily -  with meals and at bedtime. 120 tablet 0   VENTOLIN HFA 108 (90 Base) MCG/ACT inhaler Inhale 2 puffs into the lungs 4 (four) times daily as needed.     diphenoxylate-atropine (LOMOTIL) 2.5-0.025 MG tablet Take 1 tablet by mouth 4 (four) times daily as needed for diarrhea or loose stools. (Patient not taking: Reported on 07/22/2021) 30 tablet 0   gabapentin (NEURONTIN) 100 MG capsule Take 1 capsule (100 mg total) by mouth 3 (three) times daily. (Patient not taking: Reported on 08/18/2021) 90 capsule 0   HYDROcodone-acetaminophen (NORCO) 5-325 MG tablet Take 1 tablet by mouth every 6 (six) hours as needed for moderate pain. (Patient not taking: Reported on 08/18/2021) 30 tablet 0   loperamide (IMODIUM) 2 MG capsule Take 1 capsule (2 mg total) by mouth See admin instructions. Initial: 4 mg, followed by 2 mg after each loose stool; maximum: 16 mg/day (Patient not taking: Reported on 08/03/2021) 90 capsule 1   LORazepam (ATIVAN) 0.5 MG tablet Take 1 tablet (0.5 mg total) by mouth every 12 (twelve) hours as needed for anxiety (nausea). (Patient not taking: Reported on 08/18/2021) 15 tablet 0   nicotine (NICODERM CQ - DOSED IN MG/24 HR) 7 mg/24hr patch Place 1 patch (7 mg total) onto the skin daily. (Patient not taking: Reported on 08/18/2021) 28 patch 0   thiamine 100 MG tablet Take 0.5 tablets (50 mg total) by mouth daily. (Patient not taking: Reported on 08/18/2021) 15 tablet 0   Current Facility-Administered Medications  Medication Dose Route Frequency Provider Last Rate Last Admin   famotidine (PEPCID) IVPB 20 mg premix  20 mg Intravenous Once Verlon Au, NP       Facility-Administered Medications Ordered in Other Visits  Medication Dose Route Frequency Provider Last Rate Last Admin   heparin lock flush 100 unit/mL  500 Units Intravenous Once C[place]ton, Sarah M, PA-C         PHYSICAL EXAMINATION: ECOG PERFORMANCE STATUS: 2 - Symptomatic, <50% confined to  bed Vitals:   08/18/21 0911  BP: 122/68  Pulse: 90  Resp: 17  Temp: 99.3 F (37.4 C)  SpO2: 100%   Filed Weights   08/18/21 0911  Weight: 99 lb 6.4 oz (45.1 kg)    Physical Exam Vitals reviewed.  Constitutional:      General: She is not in acute distress.    Appearance: She  is well-developed. She is not ill-appearing.     Comments: Fatigued appearing. In recliner.   HENT:     Head: Atraumatic.     Nose: Nose normal.     Mouth/Throat:     Pharynx: No oropharyngeal exudate.  Eyes:     General: No scleral icterus.    Conjunctiva/sclera: Conjunctivae normal.  Cardiovascular:     Rate and Rhythm: Normal rate and regular rhythm.  Pulmonary:     Effort: Pulmonary effort is normal.     Breath sounds: Normal breath sounds.  Abdominal:     General: Bowel sounds are normal. There is no distension.     Palpations: Abdomen is soft.     Tenderness: There is no abdominal tenderness. There is no guarding.  Musculoskeletal:        General: No tenderness or deformity.     Cervical back: Normal range of motion and neck supple.  Skin:    General: Skin is warm and dry.     Coloration: Skin is pale. Skin is not jaundiced.  Neurological:     General: No focal deficit present.     Mental Status: She is alert and oriented to person, place, and time.  Psychiatric:        Mood and Affect: Mood normal.        Behavior: Behavior normal.     LABORATORY DATA:  I have reviewed the data as listed Lab Results  Component Value Date   WBC 2.4 (L) 08/13/2021   HGB 9.3 (L) 08/13/2021   HCT 27.7 (L) 08/13/2021   MCV 97.5 08/13/2021   PLT 38 (L) 08/13/2021   Recent Labs    07/29/21 0752 08/03/21 0808 08/13/21 0940  NA 138 136 134*  K 3.9 3.6 3.3*  CL 103 104 102  CO2 26 24 22   GLUCOSE 99 89 86  BUN 10 11 17   CREATININE 0.65 0.65 0.79  CALCIUM 9.1 8.5* 8.9  GFRNONAA >60 >60 >60  PROT 6.5 6.9 6.7  ALBUMIN 3.3* 3.5 3.8  AST 16 16 14*  ALT 9 10 11   ALKPHOS 66 62 58  BILITOT 0.4  0.3 0.3    Iron/TIBC/Ferritin/ %Sat    Component Value Date/Time   IRON 80 10/24/2016 1534   TIBC 282 10/24/2016 1534   FERRITIN 282 (H) 11/17/2016 1404   IRONPCTSAT 28 10/24/2016 1534     RADIOGRAPHIC STUDIES: I have personally reviewed the radiological images as listed and agreed with the findings in the report. No results found.   ASSESSMENT & PLAN:  1. Mucositis   2. Heartburn    Cancer Staging  Invasive ductal carcinoma of right breast [facility]) Staging form: Breast, AJCC 8th Edition - Clinical stage from 02/19/2021: Stage IIIB (cT3, cN0, cM0, G2, ER-, PR-, HER2-) - Signed by Earlie Server, MD on 02/20/2021  # Clinical stage III triple negative right breast cancer- diagnosed August 2022. Plan was for neoadjuvant chemo followed by surgery and radiation. Never started treatment. Transferred her care to Dr. Tasia Catchings in December 2022. cT3 cN0-1. CA 15-3 and CA 27.29 were elevated. Plan for carbo-taxol-keytruda on D1, Taxol on D8, 15 for 4 cycles every 21 days. Followed by AC-keytruda for 4 cycles every 21 days. She will receive udenyca support. She received cycle 6 on 08/03/21. She returns to clinic for supportive care.   # Chemotherapy induced peripheral neuropathy- taxol dose reduced to 70 mg/m2 for C4D8. On gabapentin 100 mg at night. She has not needed to uptitrate. Symptoms stable  and unchanged with treatment.   # Chemotherapy induced anemia- hemoglobin dipped to 8.5. Will add iron studies, ferritin, b12, and folate labs today to see if hemoglobin can be optimized. Consider transfusion for hemoglobin < 8.   # Chemotherapy induced thrombocytopenia- plt 131 today. Monitor.   # Neutrophilia- afebrile. Likely in response to GCSF administration.   # GCSF myalgias & arthralgias- not bothersome. she takes claritin 10 mg daily for 4 days.   # Chemotherapy induced nausea- may take dexamethasone 8 mg daily for 2 days post chemotherapy treatments.    # Chemotherapy-induced diarrhea- resolved.   #  Constipation- had BM yesterday. Reviewed that increasing magnesium may improve constipation but too much may cause diarrhea.   # Hypokalemia- Continue potassium chloride 47mq BID. Will give IV potassium 20 meq today. Increase oral potassium to 20 meq TID. She has medication on hand but may need to refill early.   # Hypomagnesia- History of low magnesium. Encouraged compliance with Slow-mag TID. Reduce if diarrhea recurs.   # Anxiety related to cancer diagnosis- reviewed use of ativan 0.5 mg q12h as needed. Will add cymbalta 30 mg daily for long acting anxiety, depression, and neuropathic pain. Can up-titrate if tolerating. Will give 0.5 mg ativan IV in clinic today. Patient does not drive.   Return of visit:  Add ferritin, iron studies, b12, folate today.  Pepcid and IV fluids in clinic.  RTC in 1 week for consideration of treatment as scheduled with Dr. YTasia Catchings All questions were answered. The patient knows to call the clinic with any problems questions or concerns.  LBeckey Rutter DNP, AGNP-C CD[facility]at [phone](clinic) 08/18/2021  cc BDanelle Berry NP

## 2021-08-23 ENCOUNTER — Other Ambulatory Visit: Payer: Self-pay | Admitting: Oncology

## 2021-08-23 DIAGNOSIS — C50911 Malignant neoplasm of unspecified site of right female breast: Secondary | ICD-10-CM

## 2021-08-24 ENCOUNTER — Inpatient Hospital Stay: Payer: Medicaid Other

## 2021-08-24 ENCOUNTER — Encounter: Payer: Self-pay | Admitting: Oncology

## 2021-08-24 ENCOUNTER — Inpatient Hospital Stay (HOSPITAL_BASED_OUTPATIENT_CLINIC_OR_DEPARTMENT_OTHER): Payer: Medicaid Other | Admitting: Oncology

## 2021-08-24 VITALS — BP 120/77 | HR 83 | Temp 98.1°F | Wt 102.0 lb

## 2021-08-24 DIAGNOSIS — F411 Generalized anxiety disorder: Secondary | ICD-10-CM

## 2021-08-24 DIAGNOSIS — C50911 Malignant neoplasm of unspecified site of right female breast: Secondary | ICD-10-CM

## 2021-08-24 DIAGNOSIS — Z5112 Encounter for antineoplastic immunotherapy: Secondary | ICD-10-CM | POA: Diagnosis not present

## 2021-08-24 DIAGNOSIS — C801 Malignant (primary) neoplasm, unspecified: Secondary | ICD-10-CM

## 2021-08-24 DIAGNOSIS — T451X5A Adverse effect of antineoplastic and immunosuppressive drugs, initial encounter: Secondary | ICD-10-CM

## 2021-08-24 DIAGNOSIS — D6481 Anemia due to antineoplastic chemotherapy: Secondary | ICD-10-CM | POA: Diagnosis not present

## 2021-08-24 DIAGNOSIS — E876 Hypokalemia: Secondary | ICD-10-CM

## 2021-08-24 DIAGNOSIS — G620 Drug-induced polyneuropathy: Secondary | ICD-10-CM | POA: Diagnosis not present

## 2021-08-24 LAB — CBC WITH DIFFERENTIAL/PLATELET
Abs Immature Granulocytes: 0.2 10*3/uL — ABNORMAL HIGH (ref 0.00–0.07)
Basophils Absolute: 0 10*3/uL (ref 0.0–0.1)
Basophils Relative: 0 %
Eosinophils Absolute: 0 10*3/uL (ref 0.0–0.5)
Eosinophils Relative: 0 %
HCT: 26.8 % — ABNORMAL LOW (ref 36.0–46.0)
Hemoglobin: 8.9 g/dL — ABNORMAL LOW (ref 12.0–15.0)
Immature Granulocytes: 2 %
Lymphocytes Relative: 25 %
Lymphs Abs: 2.4 10*3/uL (ref 0.7–4.0)
MCH: 33.7 pg (ref 26.0–34.0)
MCHC: 33.2 g/dL (ref 30.0–36.0)
MCV: 101.5 fL — ABNORMAL HIGH (ref 80.0–100.0)
Monocytes Absolute: 0.9 10*3/uL (ref 0.1–1.0)
Monocytes Relative: 9 %
Neutro Abs: 6.1 10*3/uL (ref 1.7–7.7)
Neutrophils Relative %: 64 %
Platelets: 341 10*3/uL (ref 150–400)
RBC: 2.64 MIL/uL — ABNORMAL LOW (ref 3.87–5.11)
RDW: 15.7 % — ABNORMAL HIGH (ref 11.5–15.5)
WBC: 9.5 10*3/uL (ref 4.0–10.5)
nRBC: 0.2 % (ref 0.0–0.2)

## 2021-08-24 LAB — COMPREHENSIVE METABOLIC PANEL
ALT: 10 U/L (ref 0–44)
AST: 19 U/L (ref 15–41)
Albumin: 3.4 g/dL — ABNORMAL LOW (ref 3.5–5.0)
Alkaline Phosphatase: 58 U/L (ref 38–126)
Anion gap: 7 (ref 5–15)
BUN: 11 mg/dL (ref 8–23)
CO2: 22 mmol/L (ref 22–32)
Calcium: 8.7 mg/dL — ABNORMAL LOW (ref 8.9–10.3)
Chloride: 109 mmol/L (ref 98–111)
Creatinine, Ser: 0.69 mg/dL (ref 0.44–1.00)
GFR, Estimated: >60 mL/min (ref >60)
Glucose, Bld: 91 mg/dL (ref 70–99)
Potassium: 3.7 mmol/L (ref 3.5–5.1)
Sodium: 138 mmol/L (ref 135–145)
Total Bilirubin: 0.3 mg/dL (ref 0.3–1.2)
Total Protein: 6.3 g/dL — ABNORMAL LOW (ref 6.5–8.1)

## 2021-08-24 LAB — MAGNESIUM: Magnesium: 1.4 mg/dL — ABNORMAL LOW (ref 1.7–2.4)

## 2021-08-24 MED ORDER — SODIUM CHLORIDE 0.9 % IV SOLN
600.0000 mg/m2 | Freq: Once | INTRAVENOUS | Status: AC
  Administered 2021-08-24: 860 mg via INTRAVENOUS
  Filled 2021-08-24: qty 43

## 2021-08-24 MED ORDER — PALONOSETRON HCL INJECTION 0.25 MG/5ML
0.2500 mg | Freq: Once | INTRAVENOUS | Status: AC
  Administered 2021-08-24: 0.25 mg via INTRAVENOUS
  Filled 2021-08-24: qty 5

## 2021-08-24 MED ORDER — SODIUM CHLORIDE 0.9 % IV SOLN
Freq: Once | INTRAVENOUS | Status: AC
  Filled 2021-08-24: qty 250

## 2021-08-24 MED ORDER — HEPARIN SOD (PORK) LOCK FLUSH 100 UNIT/ML IV SOLN
INTRAVENOUS | Status: AC
  Administered 2021-08-24: 500 [IU]
  Filled 2021-08-24: qty 5

## 2021-08-24 MED ORDER — DOXORUBICIN HCL CHEMO IV INJECTION 2 MG/ML
60.0000 mg/m2 | Freq: Once | INTRAVENOUS | Status: AC
  Administered 2021-08-24: 86 mg via INTRAVENOUS
  Filled 2021-08-24: qty 43

## 2021-08-24 MED ORDER — HEPARIN SOD (PORK) LOCK FLUSH 100 UNIT/ML IV SOLN
500.0000 [IU] | Freq: Once | INTRAVENOUS | Status: AC | PRN
  Filled 2021-08-24: qty 5

## 2021-08-24 MED ORDER — SODIUM CHLORIDE 0.9 % IV SOLN
200.0000 mg | Freq: Once | INTRAVENOUS | Status: AC
  Administered 2021-08-24: 200 mg via INTRAVENOUS
  Filled 2021-08-24: qty 200

## 2021-08-24 MED ORDER — SODIUM CHLORIDE 0.9 % IV SOLN
10.0000 mg | Freq: Once | INTRAVENOUS | Status: AC
  Administered 2021-08-24: 10 mg via INTRAVENOUS
  Filled 2021-08-24: qty 10

## 2021-08-24 MED ORDER — MAGNESIUM SULFATE 4 GM/100ML IV SOLN
4.0000 g | Freq: Once | INTRAVENOUS | Status: AC
  Administered 2021-08-24: 4 g via INTRAVENOUS
  Filled 2021-08-24: qty 100

## 2021-08-24 MED ORDER — SODIUM CHLORIDE 0.9 % IV SOLN
150.0000 mg | Freq: Once | INTRAVENOUS | Status: AC
  Administered 2021-08-24: 150 mg via INTRAVENOUS
  Filled 2021-08-24: qty 150

## 2021-08-24 NOTE — Patient Instructions (Signed)
[facility] CANCER CTR AT [place]  Discharge Instructions: Thank you for choosing [facility] to provide your oncology and hematology care.  If you have a lab appointment with the [facility], please go directly to the [facility] and check in at the registration area.  Wear comfortable clothing and clothing appropriate for easy access to any Portacath or PICC line.   We strive to give you quality time with your provider. You may need to reschedule your appointment if you arrive late (15 or more minutes).  Arriving late affects you and other patients whose appointments are after yours.  Also, if you miss three or more appointments without notifying the office, you may be dismissed from the clinic at the provider's discretion.      For prescription refill requests, have your pharmacy contact our office and allow 72 hours for refills to be completed.    Today you received the following chemotherapy and/or immunotherapy agents Keytruda, Adriamycin and Cytoxan       To help prevent nausea and vomiting after your treatment, we encourage you to take your nausea medication as directed.  BELOW ARE SYMPTOMS THAT SHOULD BE REPORTED IMMEDIATELY: *FEVER GREATER THAN 100.4 F (38 C) OR HIGHER *CHILLS OR SWEATING *NAUSEA AND VOMITING THAT IS NOT CONTROLLED WITH YOUR NAUSEA MEDICATION *UNUSUAL SHORTNESS OF BREATH *UNUSUAL BRUISING OR BLEEDING *URINARY PROBLEMS (pain or burning when urinating, or frequent urination) *BOWEL PROBLEMS (unusual diarrhea, constipation, pain near the anus) TENDERNESS IN MOUTH AND THROAT WITH OR WITHOUT PRESENCE OF ULCERS (sore throat, sores in mouth, or a toothache) UNUSUAL RASH, SWELLING OR PAIN  UNUSUAL VAGINAL DISCHARGE OR ITCHING   Items with * indicate a potential emergency and should be followed up as soon as possible or go to the Emergency Department if any problems should occur.  Please show the CHEMOTHERAPY ALERT CARD or IMMUNOTHERAPY  ALERT CARD at check-in to the Emergency Department and triage nurse.  Should you have questions after your visit or need to cancel or reschedule your appointment, please contact [facility] CANCER [place] AT [employer]  [phone] and follow the prompts.  Office hours are 8:00 a.m. to 4:30 p.m. Monday - Friday. Please note that voicemails left after 4:00 p.m. may not be returned until the following business day.  We are closed weekends and major holidays. You have access to a nurse at all times for urgent questions. Please call the main number to the clinic [phone] and follow the prompts.  For any non-urgent questions, you may also contact your provider using MyChart. We now offer e-Visits for anyone 87 and older to request care online for non-urgent symptoms. For details visit [email or web address].   Also download the MyChart app! Go to the app store, search "MyChart", open the app, select [facility], and log in with your MyChart username and password.  Due to Covid, a mask is required upon entering the hospital/clinic. If you do not have a mask, one will be given to you upon arrival. For doctor visits, patients may have 1 support person aged 66 or older with them. For treatment visits, patients cannot have anyone with them due to current Covid guidelines and our immunocompromised population.

## 2021-08-24 NOTE — Assessment & Plan Note (Signed)
Continue Slow-Mag increase to 64 mg TID. IV Mag 4g x 1 today

## 2021-08-24 NOTE — Assessment & Plan Note (Signed)
Grade 1 Encourage patient to use gabapentin 100 mg daily.   She may titrate up to 200 or 300 daily if needed

## 2021-08-24 NOTE — Assessment & Plan Note (Signed)
Stable symptoms Ativan 0.5 mg Q12h PRN anxiety

## 2021-08-24 NOTE — Assessment & Plan Note (Signed)
Clinical stage III triple negative right breast cancer. cT3 cN0-1 Labs are reviewed and discussed with patient. Proceed with AC and Keytruda today.  Day 3 for Udenyca treatments.  Take Claritin 10 mg daily for 4 days She may take dexamethasone 8 mg daily for 2 days post chemotherapy treatments.

## 2021-08-24 NOTE — Progress Notes (Signed)
Hematology/Oncology Progress note [phone] [phone]         Patient Care Team: Yvonne Berry, Scott as PCP - General (Nurse Practitioner)  REFERRING PROVIDER: [name], Scott   ASSESSMENT & PLAN:  Invasive ductal carcinoma of right breast ([place]) Clinical stage III triple negative right breast cancer. cT3 cN0-1 Labs are reviewed and discussed with patient. Proceed with AC and Keytruda today.  Day 3 for Udenyca treatments.  Take Claritin 10 mg daily for 4 days She may take dexamethasone 8 mg daily for 2 days post chemotherapy treatments.    Chemotherapy-induced neuropathy ([place]) Grade 1 Encourage patient to use gabapentin 100 mg daily.   She may titrate up to 200 or 300 daily if needed  Hypokalemia continue potassium chloride 12mq BID.   Hypomagnesemia Continue Slow-Mag increase to 64 mg TID. IV Mag 4g x 1 today  Anemia due to antineoplastic chemotherapy Hb is stable, in 8s.  Monitor closely.  Plan blood transfusion if Hb <7 or symptomatic.   Anxiety associated with cancer diagnosis (Yvonne Scott Stable symptoms Ativan 0.5 mg Q12h PRN anxiety  Orders Placed This Encounter  Procedures   Comprehensive metabolic panel    Standing Status:   Future    Standing Expiration Date:   08/24/2022   CBC with Differential/Platelet    Standing Status:   Future    Standing Expiration Date:   08/25/2022   Magnesium    Standing Status:   Future    Standing Expiration Date:   08/25/2022   Sample to Blood Bank    Standing Status:   Future    Standing Expiration Date:   08/25/2022   Lab[ cbc cmp mag hold tube], Scott 1 week, +/- blood transfusion. +/- IVF +/- Mag Lab MD 3 weeks AC+ kBeryle Flock+ D3 Yvonne Scott+/- Mag  All questions were answered. The patient knows to call the clinic with any problems, questions or concerns.  Yvonne Server MD, PhD [facility] 08/24/2021      CHIEF COMPLAINTS/REASON FOR VISIT:  Follow-up for triple negative  right breast cancer.  HISTORY OF PRESENTING ILLNESS:   Yvonne Scott a  61y.o.  female with PMH listed below was seen in consultation at the request of  BDanelle Berry Scott  for evaluation of breast cancer.  August 2022, patient was diagnosed with right breast stage IIIb cT3 N0M0, grade 2, ER negative, PR negative HER2 negative breast cancer.  Patient self palpated breast mass many years ago.  10/21/2020 diagnostic mammogram and ultrasound confirmed presence of mass and a subsequent biopsy revealed right breast triple negative cancer. There was plan for patient to start with neoadjuvant chemotherapy followed by surgery and radiation.  Unfortunately patient never started treatment.  Patient was accompanied by her mother.  They want to transfer her oncology care to locally.  Patient reports having right breast pain as well as generalized pain.  Patient had a [name] placed 3 weeks ago and she reports some swelling around the sites.  [facility]  Patient has a history of alcohol dependence.  Per patient she has not been drinking recently.  Everyday smoker.   02/24/2021, CT chest abdomen pelvis showed large right breast mass, 5.5 x 4.8 cm with small right axillary lymph node with variable degrees of enhancement potentially involved but not specific based on size.  This tracked down to retropectoral nodal stations largest lymph node along the lateral margin of the pectoralis  major.  Tiny pulmonary nodules in the chest as discussed.  These are nonspecific but would consider short interval follow-up to assess for any changes.  No evidence of metastatic disease involving the abdomen or pelvis.  Aortic atherosclerosis.  02/25/2021, echocardiogram showed LVEF 65 to 70%.  Patient has been to chemotherapy class and understands antiemetics instructions.  03/16/2021, bone scan showed no evidence of osseous metastatic breast cancer, asymmetric osseous uptake on the  right at L5-S1, corresponding to degenerative disc disease on recent CT.  Asymmetric soft tissue uptake in the right breast.  07/05/2021, right diagnostic mammogram and ultrasound showed interval decrease of the size of the right breast mass, currently 4.6 x 3.6 x 2.6, previously 5.1 x 4.4 x 3.5.  No suspicious right axillary lymphadenopathy on examination. 07/16/2021-07/19/2021, patient was admitted for HCAP patient was treated with antibiotics.  INTERVAL HISTORY Yvonne Scott is a 61 y.o. female who has above history reviewed by me today presents for follow up visit for management of triple negative breast cancer, stage IIIB She reports feeling more tired a few days after the chemotherapy.   + Nausea symptom is controlled with taking antiemetics.  Currently denies nausea vomiting diarrhea fever or chills.     Review of Systems  Constitutional:  Positive for fatigue. Negative for appetite change, chills and fever.  HENT:   Negative for hearing loss, mouth sores and voice change.   Eyes:  Negative for eye problems.  Respiratory:  Negative for chest tightness and cough.   Cardiovascular:  Negative for chest pain.  Gastrointestinal:  Negative for abdominal distention, abdominal pain, blood in stool, diarrhea and nausea.  Endocrine: Negative for hot flashes.  Genitourinary:  Negative for difficulty urinating, dysuria and frequency.   Musculoskeletal:  Negative for arthralgias.  Skin:  Negative for itching and rash.  Neurological:  Positive for numbness. Negative for extremity weakness.  Hematological:  Negative for adenopathy.  Psychiatric/Behavioral:  Negative for confusion. The patient is not nervous/anxious.     MEDICAL HISTORY:  Past Medical History:  Diagnosis Date   Anxiety    Aortic atherosclerosis ([place]) 10/24/2016   Breast cancer ([facility])    GERD (gastroesophageal reflux disease)    Personal history of chemotherapy     SURGICAL HISTORY: Past Surgical History:  Procedure  Laterality Date   BREAST BIOPSY      SOCIAL HISTORY: Social History   Socioeconomic History   Marital status: Widowed    Spouse name: Not on file   Number of children: Not on file   Years of education: Not on file   Highest education level: Not on file  Occupational History   Not on file  Tobacco Use   Smoking status: Every Day    Packs/day: 1.00    Years: 36.00    Total pack years: 36.00    Types: Cigarettes    Start date: 03/15/1979   Smokeless tobacco: Never  Vaping Use   Vaping Use: Never used  Substance and Sexual Activity   Alcohol use: No    Alcohol/week: 3.0 standard drinks of alcohol    Types: 3 Standard drinks or equivalent per week   Drug use: Not Currently   Sexual activity: Not Currently    Birth control/protection: None  Other Topics Concern   Not on file  Social History Narrative   ** Merged History Encounter **       Social Determinants of Health   Financial Resource Strain: Not on file  Food Insecurity: Not on file  Transportation Needs: Not on file  Physical Activity: Not on file  Stress: Not on file  Social Connections: Not on file  Intimate Partner Violence: Not on file    FAMILY HISTORY: Family History  Problem Relation Age of Onset   Cancer Mother 53       breast   Cancer Father        melonma   Cirrhosis Maternal Grandfather    Cancer Paternal Grandmother        skin/breast    ALLERGIES:  has No Known Allergies.  MEDICATIONS:  Current Outpatient Medications  Medication Sig Dispense Refill   calcium carbonate (TUMS EX) 750 MG chewable tablet Chew 2 tablets by mouth 4 (four) times daily.     calcium-vitamin D (OSCAL WITH D) 500-5 MG-MCG tablet Take 2 tablets by mouth daily. 60 tablet 5   dexamethasone (DECADRON) 4 MG tablet TAKE 2 TABLETS BY MOUTH ONCE DAILY FOR 2 DAYS AFTER  CHEMOTHERAPY.  TAKE  WITH  FOOD. 15 tablet 0   dextromethorphan-guaiFENesin (MUCINEX DM) 30-600 MG 12hr tablet Take 1 tablet by mouth 2 (two) times daily as  needed for cough. (Patient not taking: Reported on 08/24/2021) 30 tablet 1   diphenoxylate-atropine (LOMOTIL) 2.5-0.025 MG tablet Take 1 tablet by mouth 4 (four) times daily as needed for diarrhea or loose stools. (Patient not taking: Reported on 07/22/2021) 30 tablet 0   DULoxetine (CYMBALTA) 30 MG capsule Take 1 capsule (30 mg total) by mouth daily. 30 capsule 2   folic acid (FOLVITE) 1 MG tablet Take 1 tablet (1 mg total) by mouth daily. 30 tablet 0   gabapentin (NEURONTIN) 100 MG capsule Take 1 capsule (100 mg total) by mouth 3 (three) times daily. (Patient not taking: Reported on 08/18/2021) 90 capsule 0   HYDROcodone-acetaminophen (NORCO) 5-325 MG tablet Take 1 tablet by mouth every 6 (six) hours as needed for moderate pain. (Patient not taking: Reported on 08/18/2021) 30 tablet 0   ibuprofen (ADVIL) 200 MG tablet Take 200 mg by mouth every 6 (six) hours as needed for headache.     lidocaine-prilocaine (EMLA) cream Apply to affected area once 30 g 3   loperamide (IMODIUM) 2 MG capsule Take 1 capsule (2 mg total) by mouth See admin instructions. Initial: 4 mg, followed by 2 mg after each loose stool; maximum: 16 mg/day (Patient not taking: Reported on 08/03/2021) 90 capsule 1   LORazepam (ATIVAN) 0.5 MG tablet Take 1 tablet (0.5 mg total) by mouth every 12 (twelve) hours as needed for anxiety or sleep (or nausea). 60 tablet 0   magic mouthwash (multi-ingredient) oral suspension Take 5 mLs by mouth 4 (four) times daily as needed for mouth pain. 480 mL 1   magnesium chloride (SLOW-MAG) 64 MG TBEC SR tablet Take 1 tablet (64 mg total) by mouth 2 (two) times daily. 60 tablet 2   Multiple Vitamin (MULTIVITAMIN WITH MINERALS) TABS tablet Take 1 tablet by mouth daily. 30 tablet 0   nicotine (NICODERM CQ - DOSED IN MG/24 HR) 7 mg/24hr patch Place 1 patch (7 mg total) onto the skin daily. (Patient not taking: Reported on 08/18/2021) 28 patch 0   nystatin (MYCOSTATIN) 100000 UNIT/ML suspension Take 5 mLs (500,000  Units total) by mouth 4 (four) times daily. 60 mL 0   ondansetron (ZOFRAN) 8 MG tablet TAKE 1 TABLET BY MOUTH TWICE DAILY AS NEEDED -  START  THE  3RD  DAY  AFTER  CARBOPLATIN  AND  AC  CHEMOTHERAPY 90 tablet  0   pantoprazole (PROTONIX) 40 MG tablet Take 1 tablet (40 mg total) by mouth daily. 30 tablet 0   potassium chloride SA (KLOR-CON M) 20 MEQ tablet Take 1 tablet (20 mEq total) by mouth 3 (three) times daily. 90 tablet 1   prochlorperazine (COMPAZINE) 10 MG tablet Take 1 tablet (10 mg total) by mouth every 6 (six) hours as needed (Nausea or vomiting). 30 tablet 1   sucralfate (CARAFATE) 1 g tablet Take 1 tablet (1 g total) by mouth 4 (four) times daily -  with meals and at bedtime. 120 tablet 0   thiamine 100 MG tablet Take 0.5 tablets (50 mg total) by mouth daily. (Patient not taking: Reported on 08/18/2021) 15 tablet 0   VENTOLIN HFA 108 (90 Base) MCG/ACT inhaler Inhale 2 puffs into the lungs 4 (four) times daily as needed.     No current facility-administered medications for this visit.   Facility-Administered Medications Ordered in Other Visits  Medication Dose Route Frequency Provider Last Rate Last Admin   cyclophosphamide (CYTOXAN) 860 mg in sodium chloride 0.9 % 250 mL chemo infusion  600 mg/m2 (Order-Specific) Intravenous Once Earlie Server, MD       DOXOrubicin (ADRIAMYCIN) chemo injection 86 mg  60 mg/m2 (Order-Specific) Intravenous Once Earlie Server, MD       famotidine (PEPCID) 20-0.9 MG/50ML-% IVPB            heparin lock flush 100 UNIT/ML injection            heparin lock flush 100 unit/mL  500 Units Intracatheter Once PRN Earlie Server, MD       magnesium sulfate IVPB 4 g 100 mL  4 g Intravenous Once Earlie Server, MD 50 mL/hr at 08/24/21 0943 4 g at 08/24/21 0943   pembrolizumab (KEYTRUDA) 200 mg in sodium chloride 0.9 % 50 mL chemo infusion  200 mg Intravenous Once Earlie Server, MD         PHYSICAL EXAMINATION: ECOG PERFORMANCE STATUS: 1 - Symptomatic but completely ambulatory Vitals:    08/24/21 0833  BP: 120/77  Pulse: 83  Temp: 98.1 F (36.7 C)   Filed Weights   08/24/21 0833  Weight: 102 lb (46.3 kg)    Physical Exam Constitutional:      General: She is not in acute distress. HENT:     Head: Normocephalic and atraumatic.  Eyes:     General: No scleral icterus. Cardiovascular:     Rate and Rhythm: Normal rate and regular rhythm.     Heart sounds: Normal heart sounds.  Pulmonary:     Effort: Pulmonary effort is normal. No respiratory distress.     Breath sounds: No wheezing.  Abdominal:     General: Bowel sounds are normal. There is no distension.     Palpations: Abdomen is soft.  Musculoskeletal:        General: No deformity. Normal range of motion.     Cervical back: Normal range of motion and neck supple.  Skin:    General: Skin is warm and dry.     Findings: No erythema or rash.  Neurological:     Mental Status: She is alert and oriented to person, place, and time. Mental status is at baseline.     Cranial Nerves: No cranial nerve deficit.     Coordination: Coordination normal.  Psychiatric:        Mood and Affect: Mood normal.      LABORATORY DATA:  I have reviewed the data as listed  Lab Results  Component Value Date   WBC 9.5 08/24/2021   HGB 8.9 (L) 08/24/2021   HCT 26.8 (L) 08/24/2021   MCV 101.5 (H) 08/24/2021   PLT 341 08/24/2021   Recent Labs    08/13/21 0940 08/18/21 0843 08/24/21 0805  NA 134* 133* 138  K 3.3* 3.3* 3.7  CL 102 103 109  CO2 '22 22 22  ' GLUCOSE 86 90 91  BUN '17 9 11  ' CREATININE 0.79 0.66 0.69  CALCIUM 8.9 8.8* 8.7*  GFRNONAA >60 >60 >60  PROT 6.7 6.0* 6.3*  ALBUMIN 3.8 3.5 3.4*  AST 14* 17 19  ALT '11 9 10  ' ALKPHOS 58 66 58  BILITOT 0.3 <0.1* 0.3    Iron/TIBC/Ferritin/ %Sat    Component Value Date/Time   IRON 40 08/18/2021 1134   TIBC 234 (L) 08/18/2021 1134   FERRITIN 202 08/18/2021 1134   IRONPCTSAT 17 08/18/2021 1134   IRONPCTSAT 28 10/24/2016 1534       RADIOGRAPHIC STUDIES: I have  personally reviewed the radiological images as listed and agreed with the findings in the report. No results found.

## 2021-08-24 NOTE — Assessment & Plan Note (Signed)
continue potassium chloride 5mq BID.

## 2021-08-24 NOTE — Progress Notes (Signed)
Nutrition Follow-up:  Patient with breast cancer.  Patient receiving keytruda, adriamycin, cytoxan.  Met with patient during infusion.  Reports no diarrhea.  Has had issues with mouth sores.  Using mouth rinse.  Patient reports decreased appetite.  Has been living with mother who recently was in the hospital for heart problems.      Medications: reviewed  Labs: reviewed  Anthropometrics:   Weight 102 lb today  103 lb on 5/23 105 lb on 4/26 101 lb on 4/11 104 lb on 3/28 107 lb on 2/7 109 lb on 1/10   NUTRITION DIAGNOSIS: Inadequate oral intake ongoing   INTERVENTION:  Encouraged nibbling q 2 hours     MONITORING, EVALUATION, GOAL: weight trends, intake   NEXT VISIT: as needed with treatments  Charleene Callegari B. Zenia Resides, [place], [place] Registered Dietitian [phone]

## 2021-08-24 NOTE — Assessment & Plan Note (Signed)
Hb is stable, in 8s.  Monitor closely.  Plan blood transfusion if Hb <7 or symptomatic.

## 2021-08-26 ENCOUNTER — Inpatient Hospital Stay: Payer: Medicaid Other

## 2021-08-26 DIAGNOSIS — Z5112 Encounter for antineoplastic immunotherapy: Secondary | ICD-10-CM | POA: Diagnosis not present

## 2021-08-26 DIAGNOSIS — C50911 Malignant neoplasm of unspecified site of right female breast: Secondary | ICD-10-CM

## 2021-08-26 MED ORDER — PEGFILGRASTIM-CBQV 6 MG/0.6ML ~~LOC~~ SOSY
6.0000 mg | PREFILLED_SYRINGE | Freq: Once | SUBCUTANEOUS | Status: AC
  Administered 2021-08-26: 6 mg via SUBCUTANEOUS
  Filled 2021-08-26: qty 0.6

## 2021-08-31 ENCOUNTER — Inpatient Hospital Stay (HOSPITAL_BASED_OUTPATIENT_CLINIC_OR_DEPARTMENT_OTHER): Payer: Medicaid Other | Admitting: Medical Oncology

## 2021-08-31 ENCOUNTER — Inpatient Hospital Stay: Payer: Medicaid Other

## 2021-08-31 VITALS — BP 128/70 | HR 85 | Temp 99.5°F | Resp 14 | Wt 98.0 lb

## 2021-08-31 DIAGNOSIS — D701 Agranulocytosis secondary to cancer chemotherapy: Secondary | ICD-10-CM

## 2021-08-31 DIAGNOSIS — Z5112 Encounter for antineoplastic immunotherapy: Secondary | ICD-10-CM | POA: Diagnosis not present

## 2021-08-31 DIAGNOSIS — G620 Drug-induced polyneuropathy: Secondary | ICD-10-CM | POA: Diagnosis not present

## 2021-08-31 DIAGNOSIS — D6481 Anemia due to antineoplastic chemotherapy: Secondary | ICD-10-CM

## 2021-08-31 DIAGNOSIS — C50911 Malignant neoplasm of unspecified site of right female breast: Secondary | ICD-10-CM | POA: Diagnosis not present

## 2021-08-31 DIAGNOSIS — D6959 Other secondary thrombocytopenia: Secondary | ICD-10-CM

## 2021-08-31 DIAGNOSIS — T451X5A Adverse effect of antineoplastic and immunosuppressive drugs, initial encounter: Secondary | ICD-10-CM

## 2021-08-31 DIAGNOSIS — K1230 Oral mucositis (ulcerative), unspecified: Secondary | ICD-10-CM

## 2021-08-31 DIAGNOSIS — E876 Hypokalemia: Secondary | ICD-10-CM

## 2021-08-31 DIAGNOSIS — E860 Dehydration: Secondary | ICD-10-CM

## 2021-08-31 DIAGNOSIS — R634 Abnormal weight loss: Secondary | ICD-10-CM

## 2021-08-31 LAB — COMPREHENSIVE METABOLIC PANEL
ALT: 13 U/L (ref 0–44)
AST: 15 U/L (ref 15–41)
Albumin: 3.5 g/dL (ref 3.5–5.0)
Alkaline Phosphatase: 55 U/L (ref 38–126)
Anion gap: 8 (ref 5–15)
BUN: 15 mg/dL (ref 8–23)
CO2: 24 mmol/L (ref 22–32)
Calcium: 8.8 mg/dL — ABNORMAL LOW (ref 8.9–10.3)
Chloride: 102 mmol/L (ref 98–111)
Creatinine, Ser: 0.63 mg/dL (ref 0.44–1.00)
GFR, Estimated: >60 mL/min (ref >60)
Glucose, Bld: 81 mg/dL (ref 70–99)
Potassium: 3.4 mmol/L — ABNORMAL LOW (ref 3.5–5.1)
Sodium: 134 mmol/L — ABNORMAL LOW (ref 135–145)
Total Bilirubin: 0.3 mg/dL (ref 0.3–1.2)
Total Protein: 6.3 g/dL — ABNORMAL LOW (ref 6.5–8.1)

## 2021-08-31 LAB — CBC WITH DIFFERENTIAL/PLATELET
Abs Immature Granulocytes: 0 10*3/uL (ref 0.00–0.07)
Basophils Absolute: 0 10*3/uL (ref 0.0–0.1)
Basophils Relative: 0 %
Eosinophils Absolute: 0 10*3/uL (ref 0.0–0.5)
Eosinophils Relative: 1 %
HCT: 26.1 % — ABNORMAL LOW (ref 36.0–46.0)
Hemoglobin: 8.6 g/dL — ABNORMAL LOW (ref 12.0–15.0)
Immature Granulocytes: 0 %
Lymphocytes Relative: 92 %
Lymphs Abs: 1.1 10*3/uL (ref 0.7–4.0)
MCH: 32.8 pg (ref 26.0–34.0)
MCHC: 33 g/dL (ref 30.0–36.0)
MCV: 99.6 fL (ref 80.0–100.0)
Monocytes Absolute: 0 10*3/uL — ABNORMAL LOW (ref 0.1–1.0)
Monocytes Relative: 1 %
Neutro Abs: 0.1 10*3/uL — CL (ref 1.7–7.7)
Neutrophils Relative %: 6 %
Platelets: 27 10*3/uL — CL (ref 150–400)
RBC: 2.62 MIL/uL — ABNORMAL LOW (ref 3.87–5.11)
RDW: 15 % (ref 11.5–15.5)
Smear Review: NORMAL
WBC: 1.2 10*3/uL — CL (ref 4.0–10.5)
nRBC: 0 % (ref 0.0–0.2)

## 2021-08-31 LAB — SAMPLE TO BLOOD BANK

## 2021-08-31 LAB — MAGNESIUM: Magnesium: 1.6 mg/dL — ABNORMAL LOW (ref 1.7–2.4)

## 2021-08-31 MED ORDER — SUCRALFATE 1 G PO TABS: 1.0000 g | ORAL_TABLET | Freq: Three times a day (TID) | ORAL | 0 refills | Status: DC

## 2021-08-31 MED ORDER — LEVOFLOXACIN 500 MG PO TABS: 500.0000 mg | ORAL_TABLET | Freq: Every day | ORAL | 0 refills | Status: DC

## 2021-08-31 MED ORDER — SODIUM CHLORIDE 0.9% FLUSH
10.0000 mL | Freq: Once | INTRAVENOUS | Status: AC
  Administered 2021-08-31: 10 mL via INTRAVENOUS
  Filled 2021-08-31: qty 10

## 2021-08-31 MED ORDER — HEPARIN SOD (PORK) LOCK FLUSH 100 UNIT/ML IV SOLN
500.0000 [IU] | Freq: Once | INTRAVENOUS | Status: AC
  Administered 2021-08-31: 500 [IU] via INTRAVENOUS
  Filled 2021-08-31: qty 5

## 2021-08-31 NOTE — Progress Notes (Signed)
Hematology/Oncology Progress note [phone] [phone]         Patient Care Team: [name], NP as PCP - General (Nurse Practitioner)  REFERRING PROVIDER:    CHIEF COMPLAINTS/REASON FOR VISIT:  Follow-up for triple negative right breast cancer.  HISTORY OF PRESENTING ILLNESS:   Yvonne Scott is a  61 y.o.  female with PMH listed below was seen in consultation at the request of  [name], NP  for evaluation of breast cancer.  August 2022, patient was diagnosed with right breast stage IIIb cT3 N0M0, grade 2, ER negative, PR negative HER2 negative breast cancer.  Patient self palpated breast mass many years ago.  10/21/2020 diagnostic mammogram and ultrasound confirmed presence of mass and a subsequent biopsy revealed right breast triple negative cancer. There was plan for patient to start with neoadjuvant chemotherapy followed by surgery and radiation.  Unfortunately patient never started treatment.  Patient was accompanied by her mother.  They want to transfer her oncology care to locally.  Patient reports having right breast pain as well as generalized pain.  Patient had a [name] placed 3 weeks ago and she reports some swelling around the sites.  Patient has had negative genetic testing at [facility].  Patient has a history of alcohol dependence.  Per patient she has not been drinking recently.  Everyday smoker.   02/24/2021, CT chest abdomen pelvis showed large right breast mass, 5.5 x 4.8 cm with small right axillary lymph node with variable degrees of enhancement potentially involved but not specific based on size.  This tracked down to retropectoral nodal stations largest lymph node along the lateral margin of the pectoralis major.  Tiny pulmonary nodules in the chest as discussed.  These are nonspecific but would consider short interval follow-up to assess for any changes.  No evidence of metastatic disease involving the abdomen or pelvis.   Aortic atherosclerosis.  02/25/2021, echocardiogram showed LVEF 65 to 70%.  Patient has been to chemotherapy class and understands antiemetics instructions.  03/16/2021, bone scan showed no evidence of osseous metastatic breast cancer, asymmetric osseous uptake on the right at L5-S1, corresponding to degenerative disc disease on recent CT.  Asymmetric soft tissue uptake in the right breast.  07/05/2021, right diagnostic mammogram and ultrasound showed interval decrease of the size of the right breast mass, currently 4.6 x 3.6 x 2.6, previously 5.1 x 4.4 x 3.5.  No suspicious right axillary lymphadenopathy on examination. 07/16/2021-07/19/2021, patient was admitted for HCAP patient was treated with antibiotics.  INTERVAL HISTORY Yvonne Scott is a 61 y.o. female who has above history reviewed by me today presents for follow up visit for management of triple negative breast cancer, stage IIIB She continues to reports feeling more tired a few days after the chemotherapy.  She asks if she can have prednisone for this. Only other complain is mild esophagitis due to reducing her dose of sucralfate due to medication burden fatigue. Has caused some decreased oral intake but she reports that she will return to original dosing and normally this improves her appetite and pain fast. No fevers, cough, dysuria, hematuria, bloody stools, nosebleeds.    Review of Systems  Constitutional:  Positive for fatigue. Negative for appetite change, chills and fever.  HENT:   Negative for hearing loss, mouth sores and voice change.   Eyes:  Negative for eye problems.  Respiratory:  Negative for chest tightness and cough.   Cardiovascular:  Negative for chest pain.  Gastrointestinal:  Negative  for abdominal distention, abdominal pain, blood in stool, diarrhea and nausea.  Endocrine: Negative for hot flashes.  Genitourinary:  Negative for difficulty urinating, dysuria and frequency.   Musculoskeletal:  Negative for  arthralgias.  Skin:  Negative for itching and rash.  Neurological:  Positive for numbness. Negative for extremity weakness.  Hematological:  Negative for adenopathy.  Psychiatric/Behavioral:  Negative for confusion. The patient is not nervous/anxious.     MEDICAL HISTORY:  Past Medical History:  Diagnosis Date   Anxiety    Aortic atherosclerosis ([place]) 10/24/2016   Breast cancer (HCC)    GERD (gastroesophageal reflux disease)    Personal history of chemotherapy     SURGICAL HISTORY: Past Surgical History:  Procedure Laterality Date   BREAST BIOPSY      SOCIAL HISTORY: Social History   Socioeconomic History   Marital status: Widowed    Spouse name: Not on file   Number of children: Not on file   Years of education: Not on file   Highest education level: Not on file  Occupational History   Not on file  Tobacco Use   Smoking status: Every Day    Packs/day: 1.00    Years: 36.00    Total pack years: 36.00    Types: Cigarettes    Start date: 03/15/1979   Smokeless tobacco: Never  Vaping Use   Vaping Use: Never used  Substance and Sexual Activity   Alcohol use: No    Alcohol/week: 3.0 standard drinks of alcohol    Types: 3 Standard drinks or equivalent per week   Drug use: Not Currently   Sexual activity: Not Currently    Birth control/protection: None  Other Topics Concern   Not on file  Social History Narrative   ** Merged History Encounter **       Social Determinants of Health   Financial Resource Strain: Not on file  Food Insecurity: Not on file  Transportation Needs: Not on file  Physical Activity: Not on file  Stress: Not on file  Social Connections: Not on file  Intimate Partner Violence: Not on file    FAMILY HISTORY: Family History  Problem Relation Age of Onset   Cancer Mother 75       breast   Cancer Father        melonma   Cirrhosis Maternal Grandfather    Cancer Paternal Grandmother        skin/breast    ALLERGIES:  has No Known  Allergies.  MEDICATIONS:  Current Outpatient Medications  Medication Sig Dispense Refill   calcium carbonate (TUMS EX) 750 MG chewable tablet Chew 2 tablets by mouth 4 (four) times daily.     calcium-vitamin D (OSCAL WITH D) 500-5 MG-MCG tablet Take 2 tablets by mouth daily. 60 tablet 5   dexamethasone (DECADRON) 4 MG tablet TAKE 2 TABLETS BY MOUTH ONCE DAILY FOR 2 DAYS AFTER  CHEMOTHERAPY.  TAKE  WITH  FOOD. 15 tablet 0   dextromethorphan-guaiFENesin (MUCINEX DM) 30-600 MG 12hr tablet Take 1 tablet by mouth 2 (two) times daily as needed for cough. (Patient not taking: Reported on 08/24/2021) 30 tablet 1   diphenoxylate-atropine (LOMOTIL) 2.5-0.025 MG tablet Take 1 tablet by mouth 4 (four) times daily as needed for diarrhea or loose stools. (Patient not taking: Reported on 07/22/2021) 30 tablet 0   DULoxetine (CYMBALTA) 30 MG capsule Take 1 capsule (30 mg total) by mouth daily. 30 capsule 2   folic acid (FOLVITE) 1 MG tablet Take 1 tablet (1  mg total) by mouth daily. 30 tablet 0   gabapentin (NEURONTIN) 100 MG capsule Take 1 capsule (100 mg total) by mouth 3 (three) times daily. (Patient not taking: Reported on 08/18/2021) 90 capsule 0   HYDROcodone-acetaminophen (NORCO) 5-325 MG tablet Take 1 tablet by mouth every 6 (six) hours as needed for moderate pain. (Patient not taking: Reported on 08/18/2021) 30 tablet 0   ibuprofen (ADVIL) 200 MG tablet Take 200 mg by mouth every 6 (six) hours as needed for headache.     lidocaine-prilocaine (EMLA) cream Apply to affected area once 30 g 3   loperamide (IMODIUM) 2 MG capsule Take 1 capsule (2 mg total) by mouth See admin instructions. Initial: 4 mg, followed by 2 mg after each loose stool; maximum: 16 mg/day (Patient not taking: Reported on 08/03/2021) 90 capsule 1   LORazepam (ATIVAN) 0.5 MG tablet Take 1 tablet (0.5 mg total) by mouth every 12 (twelve) hours as needed for anxiety or sleep (or nausea). 60 tablet 0   magic mouthwash (multi-ingredient) oral  suspension Take 5 mLs by mouth 4 (four) times daily as needed for mouth pain. 480 mL 1   magnesium chloride (SLOW-MAG) 64 MG TBEC SR tablet Take 1 tablet (64 mg total) by mouth 2 (two) times daily. 60 tablet 2   Multiple Vitamin (MULTIVITAMIN WITH MINERALS) TABS tablet Take 1 tablet by mouth daily. 30 tablet 0   nicotine (NICODERM CQ - DOSED IN MG/24 HR) 7 mg/24hr patch Place 1 patch (7 mg total) onto the skin daily. (Patient not taking: Reported on 08/18/2021) 28 patch 0   nystatin (MYCOSTATIN) 100000 UNIT/ML suspension Take 5 mLs (500,000 Units total) by mouth 4 (four) times daily. 60 mL 0   ondansetron (ZOFRAN) 8 MG tablet TAKE 1 TABLET BY MOUTH TWICE DAILY AS NEEDED -  START  THE  3RD  DAY  AFTER  CARBOPLATIN  AND  AC  CHEMOTHERAPY 90 tablet 0   pantoprazole (PROTONIX) 40 MG tablet Take 1 tablet (40 mg total) by mouth daily. 30 tablet 0   potassium chloride SA (KLOR-CON M) 20 MEQ tablet Take 1 tablet (20 mEq total) by mouth 3 (three) times daily. 90 tablet 1   prochlorperazine (COMPAZINE) 10 MG tablet Take 1 tablet (10 mg total) by mouth every 6 (six) hours as needed (Nausea or vomiting). 30 tablet 1   sucralfate (CARAFATE) 1 g tablet Take 1 tablet (1 g total) by mouth 4 (four) times daily -  with meals and at bedtime. 120 tablet 0   thiamine 100 MG tablet Take 0.5 tablets (50 mg total) by mouth daily. (Patient not taking: Reported on 08/18/2021) 15 tablet 0   VENTOLIN HFA 108 (90 Base) MCG/ACT inhaler Inhale 2 puffs into the lungs 4 (four) times daily as needed.     No current facility-administered medications for this visit.   Facility-Administered Medications Ordered in Other Visits  Medication Dose Route Frequency Provider Last Rate Last Admin   famotidine (PEPCID) 20-0.9 MG/50ML-% IVPB              PHYSICAL EXAMINATION: ECOG PERFORMANCE STATUS: 1 - Symptomatic but completely ambulatory Vitals:   08/31/21 0922  BP: 128/70  Pulse: 85  Resp: 14  Temp: 99.5 F (37.5 C)  SpO2: 100%    Filed Weights   08/31/21 0922  Weight: 98 lb (44.5 kg)    Physical Exam Constitutional:      General: She is not in acute distress. HENT:     Head: Normocephalic  and atraumatic.  Eyes:     General: No scleral icterus. Cardiovascular:     Rate and Rhythm: Normal rate and regular rhythm.     Heart sounds: Normal heart sounds.  Pulmonary:     Effort: Pulmonary effort is normal. No respiratory distress.     Breath sounds: No wheezing.  Abdominal:     General: Bowel sounds are normal. There is no distension.     Palpations: Abdomen is soft.  Musculoskeletal:        General: No deformity. Normal range of motion.     Cervical back: Normal range of motion and neck supple.  Skin:    General: Skin is warm and dry.     Findings: No erythema or rash.  Neurological:     Mental Status: She is alert and oriented to person, place, and time. Mental status is at baseline.     Cranial Nerves: No cranial nerve deficit.     Coordination: Coordination normal.  Psychiatric:        Mood and Affect: Mood normal.      LABORATORY DATA:  I have reviewed the data as listed Lab Results  Component Value Date   WBC 1.2 (LL) 08/31/2021   HGB 8.6 (L) 08/31/2021   HCT 26.1 (L) 08/31/2021   MCV 99.6 08/31/2021   PLT 27 (LL) 08/31/2021   Recent Labs    08/18/21 0843 08/24/21 0805 08/31/21 0853  NA 133* 138 134*  K 3.3* 3.7 3.4*  CL 103 109 102  CO2 22 22 24   GLUCOSE 90 91 81  BUN 9 11 15   CREATININE 0.66 0.69 0.63  CALCIUM 8.8* 8.7* 8.8*  GFRNONAA >60 >60 >60  PROT 6.0* 6.3* 6.3*  ALBUMIN 3.5 3.4* 3.5  AST 17 19 15   ALT 9 10 13   ALKPHOS 66 58 55  BILITOT <0.1* 0.3 0.3   Iron/TIBC/Ferritin/ %Sat    Component Value Date/Time   IRON 40 08/18/2021 1134   TIBC 234 (L) 08/18/2021 1134   FERRITIN 202 08/18/2021 1134   IRONPCTSAT 17 08/18/2021 1134   IRONPCTSAT 28 10/24/2016 1534     RADIOGRAPHIC STUDIES: I have personally reviewed the radiological images as listed and agreed  with the findings in the report. No results found.  ASSESSMENT & PLAN:  Encounter Diagnoses  Name Primary?   Invasive ductal carcinoma of right breast ([place]) Yes   Anemia due to antineoplastic chemotherapy    Chemotherapy-induced neuropathy (HCC)    Hypokalemia    Hypomagnesemia    Dehydration    Mucositis    Chemotherapy induced neutropenia (HCC)    Loss of weight    Chemotherapy-induced thrombocytopenia    Pt reports that she is feeling well today. She can try American Ginseng to help with energy if desired- discussed rational of prednisone and keytruda and why long term prednisone is not ideal. She will stay hydrated with water and electrolyte drinks, K supplementation, continue Ensure/Boosts, return to her sucralfate 4 times per day dosing. We discussed her WBCS count and platelet count- response to chemotherapy on bone marrow- appropriate and should improve over the next few days. Discussed red flag signs and symptoms (bleeding, fever, etc). She will also try to avoid any activity that could cause bleeding. If ANC is lower than expected will start on prophylactic abx as discussed. Follow up next week or sooner as needed.    Lab[ cbc cmp mag hold tube], NP 1 week, +/- IVF +/- Mag. May need to schedule blood transfusion  for day after if hemoglobin is below 7.   Lab MD 2 weeks AC+ Beryle Flock + D3 [name] +/- Mag  All questions were answered. The patient knows to call the clinic with any problems, questions or concerns.  Nelwyn Salisbury PA-C [facility]

## 2021-08-31 NOTE — Progress Notes (Signed)
Returns for follow-up. She reports some esophageal pain with swallowing, and endorses some fatigue. She reports that she is still taking carafate, but not always 4 times a day.

## 2021-09-01 ENCOUNTER — Inpatient Hospital Stay: Payer: Medicaid Other

## 2021-09-01 ENCOUNTER — Telehealth: Payer: Self-pay | Admitting: *Deleted

## 2021-09-01 NOTE — Telephone Encounter (Signed)
Spoke to patient. She is feeling some better and does not want to go to the hospital. Her main concern is now tongue pain and excessive saliva. I encouraged pt to utilize medications that she has on-hand, such as magic mouth wash and warm salt water rinses. Will call pt in AM to follow-up.

## 2021-09-01 NOTE — Telephone Encounter (Signed)
Patient mother called reporting that patient is in bad shape She is having lower abdominal pain bilaterally, she is unable to eat, her legs are so weak that she has not gotten out of bed since yesterday and when she has to get up, she has to hold on to something or she will fall. She has been having pain and problems, but they exacerbated yesterday. Mother feels patient needs to be in hospital and is asking what to do. Please advise

## 2021-09-01 NOTE — Telephone Encounter (Signed)
I called patient mother back and advised she take patient to ER and that if unable to get her there to call 911 to take her. Her response was immediately "Well she is better, I put a heating pad on her ribs and that has helped, what is bothering her now is the saliva, can you order something for that, she does not want to go to the hospital." I replied that she has called twice complaining that patient is having lower abdominal pain and that we cannot make her go to ER if she chooses not to go and I am unsure what we can do for her. She stated she is sorry, "she is hurting all over and she just needs to have something for the saliva that is bothering her" [name], can you please call the patient?

## 2021-09-02 NOTE — Telephone Encounter (Signed)
Called to check on patient, but she was sleeping. Spoke to pt's mother, who stated that pt was doing some better. I encouraged pt or mother to call us if any further concerns.

## 2021-09-04 ENCOUNTER — Encounter: Payer: Self-pay | Admitting: Emergency Medicine

## 2021-09-04 ENCOUNTER — Inpatient Hospital Stay
Admission: EM | Admit: 2021-09-04 | Discharge: 2021-09-08 | DRG: 808 | Disposition: A | Discharge status: Home / Self Care | Payer: Medicaid Other | Attending: Internal Medicine | Admitting: Internal Medicine

## 2021-09-04 ENCOUNTER — Other Ambulatory Visit: Payer: Self-pay

## 2021-09-04 ENCOUNTER — Emergency Department: Payer: Medicaid Other

## 2021-09-04 DIAGNOSIS — E860 Dehydration: Secondary | ICD-10-CM | POA: Diagnosis present

## 2021-09-04 DIAGNOSIS — Z809 Family history of malignant neoplasm, unspecified: Secondary | ICD-10-CM

## 2021-09-04 DIAGNOSIS — G620 Drug-induced polyneuropathy: Secondary | ICD-10-CM | POA: Diagnosis present

## 2021-09-04 DIAGNOSIS — E876 Hypokalemia: Secondary | ICD-10-CM | POA: Diagnosis not present

## 2021-09-04 DIAGNOSIS — R9431 Abnormal electrocardiogram [ECG] [EKG]: Secondary | ICD-10-CM

## 2021-09-04 DIAGNOSIS — Z681 Body mass index (BMI) 19 or less, adult: Secondary | ICD-10-CM

## 2021-09-04 DIAGNOSIS — R55 Syncope and collapse: Secondary | ICD-10-CM | POA: Diagnosis not present

## 2021-09-04 DIAGNOSIS — D696 Thrombocytopenia, unspecified: Secondary | ICD-10-CM | POA: Diagnosis not present

## 2021-09-04 DIAGNOSIS — B370 Candidal stomatitis: Secondary | ICD-10-CM | POA: Diagnosis present

## 2021-09-04 DIAGNOSIS — I700 Atherosclerosis of aorta: Secondary | ICD-10-CM | POA: Diagnosis present

## 2021-09-04 DIAGNOSIS — R197 Diarrhea, unspecified: Secondary | ICD-10-CM | POA: Diagnosis present

## 2021-09-04 DIAGNOSIS — E43 Unspecified severe protein-calorie malnutrition: Secondary | ICD-10-CM | POA: Insufficient documentation

## 2021-09-04 DIAGNOSIS — F17210 Nicotine dependence, cigarettes, uncomplicated: Secondary | ICD-10-CM | POA: Diagnosis present

## 2021-09-04 DIAGNOSIS — K219 Gastro-esophageal reflux disease without esophagitis: Secondary | ICD-10-CM | POA: Diagnosis present

## 2021-09-04 DIAGNOSIS — D61810 Antineoplastic chemotherapy induced pancytopenia: Principal | ICD-10-CM | POA: Diagnosis present

## 2021-09-04 DIAGNOSIS — Z79899 Other long term (current) drug therapy: Secondary | ICD-10-CM

## 2021-09-04 DIAGNOSIS — C50911 Malignant neoplasm of unspecified site of right female breast: Secondary | ICD-10-CM | POA: Diagnosis present

## 2021-09-04 DIAGNOSIS — E872 Acidosis, unspecified: Secondary | ICD-10-CM | POA: Diagnosis present

## 2021-09-04 DIAGNOSIS — I10 Essential (primary) hypertension: Secondary | ICD-10-CM | POA: Diagnosis present

## 2021-09-04 DIAGNOSIS — D6481 Anemia due to antineoplastic chemotherapy: Secondary | ICD-10-CM

## 2021-09-04 DIAGNOSIS — D0511 Intraductal carcinoma in situ of right breast: Secondary | ICD-10-CM | POA: Diagnosis present

## 2021-09-04 DIAGNOSIS — C799 Secondary malignant neoplasm of unspecified site: Secondary | ICD-10-CM | POA: Diagnosis present

## 2021-09-04 DIAGNOSIS — T451X5A Adverse effect of antineoplastic and immunosuppressive drugs, initial encounter: Secondary | ICD-10-CM

## 2021-09-04 DIAGNOSIS — R1310 Dysphagia, unspecified: Secondary | ICD-10-CM

## 2021-09-04 LAB — CBC WITH DIFFERENTIAL/PLATELET
Abs Immature Granulocytes: 0.13 10*3/uL — ABNORMAL HIGH (ref 0.00–0.07)
Basophils Absolute: 0 10*3/uL (ref 0.0–0.1)
Basophils Relative: 2 %
Eosinophils Absolute: 0 10*3/uL (ref 0.0–0.5)
Eosinophils Relative: 1 %
HCT: 25.4 % — ABNORMAL LOW (ref 36.0–46.0)
Hemoglobin: 8.5 g/dL — ABNORMAL LOW (ref 12.0–15.0)
Immature Granulocytes: 6 %
Lymphocytes Relative: 21 %
Lymphs Abs: 0.4 10*3/uL — ABNORMAL LOW (ref 0.7–4.0)
MCH: 32 pg (ref 26.0–34.0)
MCHC: 33.5 g/dL (ref 30.0–36.0)
MCV: 95.5 fL (ref 80.0–100.0)
Monocytes Absolute: 0.2 10*3/uL (ref 0.1–1.0)
Monocytes Relative: 10 %
Neutro Abs: 1.3 10*3/uL — ABNORMAL LOW (ref 1.7–7.7)
Neutrophils Relative %: 60 %
Platelets: 5 10*3/uL — CL (ref 150–400)
RBC: 2.66 MIL/uL — ABNORMAL LOW (ref 3.87–5.11)
RDW: 14.5 % (ref 11.5–15.5)
WBC: 2.2 10*3/uL — ABNORMAL LOW (ref 4.0–10.5)
nRBC: 0 % (ref 0.0–0.2)

## 2021-09-04 LAB — COMPREHENSIVE METABOLIC PANEL
ALT: 13 U/L (ref 0–44)
AST: 15 U/L (ref 15–41)
Albumin: 3.2 g/dL — ABNORMAL LOW (ref 3.5–5.0)
Alkaline Phosphatase: 60 U/L (ref 38–126)
Anion gap: 12 (ref 5–15)
BUN: 22 mg/dL (ref 8–23)
CO2: 20 mmol/L — ABNORMAL LOW (ref 22–32)
Calcium: 8.6 mg/dL — ABNORMAL LOW (ref 8.9–10.3)
Chloride: 100 mmol/L (ref 98–111)
Creatinine, Ser: 0.65 mg/dL (ref 0.44–1.00)
GFR, Estimated: >60 mL/min (ref >60)
Glucose, Bld: 111 mg/dL — ABNORMAL HIGH (ref 70–99)
Potassium: 3 mmol/L — ABNORMAL LOW (ref 3.5–5.1)
Sodium: 132 mmol/L — ABNORMAL LOW (ref 135–145)
Total Bilirubin: 0.7 mg/dL (ref 0.3–1.2)
Total Protein: 6.3 g/dL — ABNORMAL LOW (ref 6.5–8.1)

## 2021-09-04 LAB — CBC
HCT: 21.9 % — ABNORMAL LOW (ref 36.0–46.0)
Hemoglobin: 7.3 g/dL — ABNORMAL LOW (ref 12.0–15.0)
MCH: 31.6 pg (ref 26.0–34.0)
MCHC: 33.3 g/dL (ref 30.0–36.0)
MCV: 94.8 fL (ref 80.0–100.0)
Platelets: 6 10*3/uL — CL (ref 150–400)
RBC: 2.31 MIL/uL — ABNORMAL LOW (ref 3.87–5.11)
RDW: 14.4 % (ref 11.5–15.5)
WBC: 2.8 10*3/uL — ABNORMAL LOW (ref 4.0–10.5)
nRBC: 0 % (ref 0.0–0.2)

## 2021-09-04 LAB — ABO/RH: ABO/RH(D): O POS

## 2021-09-04 LAB — PHOSPHORUS: Phosphorus: 2.3 mg/dL — ABNORMAL LOW (ref 2.5–4.6)

## 2021-09-04 LAB — MAGNESIUM: Magnesium: 1.4 mg/dL — ABNORMAL LOW (ref 1.7–2.4)

## 2021-09-04 MED ORDER — PANTOPRAZOLE SODIUM 40 MG PO TBEC
40.0000 mg | DELAYED_RELEASE_TABLET | Freq: Every day | ORAL | Status: DC
Start: 2021-09-04 — End: 2021-09-08
  Administered 2021-09-04 – 2021-09-08 (×5): 40 mg via ORAL
  Filled 2021-09-04 (×5): qty 1

## 2021-09-04 MED ORDER — ENSURE ENLIVE PO LIQD
237.0000 mL | Freq: Two times a day (BID) | ORAL | Status: DC
  Administered 2021-09-04 – 2021-09-06 (×4): 237 mL via ORAL

## 2021-09-04 MED ORDER — SODIUM CHLORIDE 0.9% IV SOLUTION
Freq: Once | INTRAVENOUS | Status: AC
  Filled 2021-09-04: qty 250

## 2021-09-04 MED ORDER — POTASSIUM CHLORIDE CRYS ER 20 MEQ PO TBCR
40.0000 meq | EXTENDED_RELEASE_TABLET | Freq: Once | ORAL | Status: AC
  Administered 2021-09-04: 40 meq via ORAL
  Filled 2021-09-04: qty 2

## 2021-09-04 MED ORDER — IBUPROFEN 400 MG PO TABS: 200.0000 mg | ORAL_TABLET | Freq: Four times a day (QID) | ORAL | Status: DC | PRN

## 2021-09-04 MED ORDER — SODIUM CHLORIDE 0.9 % IV SOLN: INTRAVENOUS | Status: DC

## 2021-09-04 MED ORDER — LORAZEPAM 0.5 MG PO TABS
0.5000 mg | ORAL_TABLET | Freq: Two times a day (BID) | ORAL | Status: DC | PRN
  Administered 2021-09-06 – 2021-09-07 (×2): 0.5 mg via ORAL
  Filled 2021-09-04 (×2): qty 1

## 2021-09-04 MED ORDER — POTASSIUM CHLORIDE CRYS ER 20 MEQ PO TBCR
20.0000 meq | EXTENDED_RELEASE_TABLET | Freq: Three times a day (TID) | ORAL | Status: DC
  Administered 2021-09-05 (×3): 20 meq via ORAL
  Filled 2021-09-04 (×3): qty 1

## 2021-09-04 MED ORDER — POTASSIUM CHLORIDE 10 MEQ/100ML IV SOLN
10.0000 meq | INTRAVENOUS | Status: AC
  Administered 2021-09-04 (×2): 10 meq via INTRAVENOUS
  Filled 2021-09-04: qty 100

## 2021-09-04 MED ORDER — SODIUM CHLORIDE 0.9 % IV BOLUS
1000.0000 mL | Freq: Once | INTRAVENOUS | Status: AC
  Administered 2021-09-04: 1000 mL via INTRAVENOUS

## 2021-09-04 MED ORDER — ADULT MULTIVITAMIN W/MINERALS CH
1.0000 | ORAL_TABLET | Freq: Every day | ORAL | Status: DC
  Administered 2021-09-04 – 2021-09-08 (×5): 1 via ORAL
  Filled 2021-09-04 (×5): qty 1

## 2021-09-04 MED ORDER — SUCRALFATE 1 G PO TABS
1.0000 g | ORAL_TABLET | Freq: Three times a day (TID) | ORAL | Status: DC
  Administered 2021-09-04 – 2021-09-08 (×16): 1 g via ORAL
  Filled 2021-09-04 (×16): qty 1

## 2021-09-04 MED ORDER — MAGNESIUM CHLORIDE 64 MG PO TBEC
1.0000 | DELAYED_RELEASE_TABLET | Freq: Two times a day (BID) | ORAL | Status: DC
  Administered 2021-09-04 – 2021-09-08 (×8): 64 mg via ORAL
  Filled 2021-09-04 (×8): qty 1

## 2021-09-04 MED ORDER — MAGNESIUM SULFATE 2 GM/50ML IV SOLN
2.0000 g | Freq: Once | INTRAVENOUS | Status: AC
  Administered 2021-09-04: 2 g via INTRAVENOUS
  Filled 2021-09-04: qty 50

## 2021-09-05 ENCOUNTER — Other Ambulatory Visit: Payer: Self-pay

## 2021-09-05 DIAGNOSIS — R1310 Dysphagia, unspecified: Secondary | ICD-10-CM

## 2021-09-05 DIAGNOSIS — R9431 Abnormal electrocardiogram [ECG] [EKG]: Secondary | ICD-10-CM

## 2021-09-05 DIAGNOSIS — E43 Unspecified severe protein-calorie malnutrition: Secondary | ICD-10-CM | POA: Diagnosis present

## 2021-09-05 DIAGNOSIS — R55 Syncope and collapse: Secondary | ICD-10-CM | POA: Diagnosis present

## 2021-09-05 DIAGNOSIS — B370 Candidal stomatitis: Secondary | ICD-10-CM | POA: Diagnosis present

## 2021-09-05 DIAGNOSIS — Z79899 Other long term (current) drug therapy: Secondary | ICD-10-CM | POA: Diagnosis not present

## 2021-09-05 DIAGNOSIS — T451X5A Adverse effect of antineoplastic and immunosuppressive drugs, initial encounter: Secondary | ICD-10-CM | POA: Diagnosis present

## 2021-09-05 DIAGNOSIS — K219 Gastro-esophageal reflux disease without esophagitis: Secondary | ICD-10-CM | POA: Diagnosis present

## 2021-09-05 DIAGNOSIS — Z681 Body mass index (BMI) 19 or less, adult: Secondary | ICD-10-CM | POA: Diagnosis not present

## 2021-09-05 DIAGNOSIS — D61810 Antineoplastic chemotherapy induced pancytopenia: Secondary | ICD-10-CM | POA: Diagnosis present

## 2021-09-05 DIAGNOSIS — R197 Diarrhea, unspecified: Secondary | ICD-10-CM | POA: Diagnosis present

## 2021-09-05 DIAGNOSIS — C50911 Malignant neoplasm of unspecified site of right female breast: Secondary | ICD-10-CM

## 2021-09-05 DIAGNOSIS — D696 Thrombocytopenia, unspecified: Secondary | ICD-10-CM | POA: Diagnosis not present

## 2021-09-05 DIAGNOSIS — D6481 Anemia due to antineoplastic chemotherapy: Secondary | ICD-10-CM | POA: Diagnosis not present

## 2021-09-05 DIAGNOSIS — E872 Acidosis, unspecified: Secondary | ICD-10-CM | POA: Diagnosis present

## 2021-09-05 DIAGNOSIS — I10 Essential (primary) hypertension: Secondary | ICD-10-CM | POA: Diagnosis present

## 2021-09-05 DIAGNOSIS — D0511 Intraductal carcinoma in situ of right breast: Secondary | ICD-10-CM | POA: Diagnosis present

## 2021-09-05 DIAGNOSIS — C799 Secondary malignant neoplasm of unspecified site: Secondary | ICD-10-CM | POA: Diagnosis present

## 2021-09-05 DIAGNOSIS — E876 Hypokalemia: Secondary | ICD-10-CM | POA: Diagnosis present

## 2021-09-05 DIAGNOSIS — E860 Dehydration: Secondary | ICD-10-CM | POA: Diagnosis present

## 2021-09-05 DIAGNOSIS — F17210 Nicotine dependence, cigarettes, uncomplicated: Secondary | ICD-10-CM | POA: Diagnosis present

## 2021-09-05 DIAGNOSIS — I700 Atherosclerosis of aorta: Secondary | ICD-10-CM | POA: Diagnosis present

## 2021-09-05 DIAGNOSIS — Z809 Family history of malignant neoplasm, unspecified: Secondary | ICD-10-CM | POA: Diagnosis not present

## 2021-09-05 DIAGNOSIS — G620 Drug-induced polyneuropathy: Secondary | ICD-10-CM | POA: Diagnosis present

## 2021-09-05 LAB — CBC
HCT: 16.5 % — ABNORMAL LOW (ref 36.0–46.0)
Hemoglobin: 5.5 g/dL — ABNORMAL LOW (ref 12.0–15.0)
MCH: 32.2 pg (ref 26.0–34.0)
MCHC: 33.3 g/dL (ref 30.0–36.0)
MCV: 96.5 fL (ref 80.0–100.0)
Platelets: 23 10*3/uL — CL (ref 150–400)
RBC: 1.71 MIL/uL — ABNORMAL LOW (ref 3.87–5.11)
RDW: 14.3 % (ref 11.5–15.5)
WBC: 3.7 10*3/uL — ABNORMAL LOW (ref 4.0–10.5)
nRBC: 0 % (ref 0.0–0.2)

## 2021-09-05 LAB — MAGNESIUM: Magnesium: 1.4 mg/dL — ABNORMAL LOW (ref 1.7–2.4)

## 2021-09-05 LAB — BPAM PLATELET PHERESIS
Blood Product Expiration Date: 202306262359
ISSUE DATE / TIME: 202306241723
Unit Type and Rh: 5100

## 2021-09-05 LAB — PREPARE PLATELET PHERESIS: Unit division: 0

## 2021-09-05 LAB — HEMOGLOBIN AND HEMATOCRIT, BLOOD
HCT: 31 % — ABNORMAL LOW (ref 36.0–46.0)
Hemoglobin: 10.5 g/dL — ABNORMAL LOW (ref 12.0–15.0)

## 2021-09-05 LAB — POTASSIUM: Potassium: 4.3 mmol/L (ref 3.5–5.1)

## 2021-09-05 LAB — PREPARE RBC (CROSSMATCH)

## 2021-09-05 MED ORDER — SODIUM BICARBONATE 650 MG PO TABS
650.0000 mg | ORAL_TABLET | Freq: Every day | ORAL | Status: DC
  Administered 2021-09-05 – 2021-09-07 (×3): 650 mg via ORAL
  Filled 2021-09-05 (×3): qty 1

## 2021-09-05 MED ORDER — CALCIUM GLUCONATE-NACL 2-0.675 GM/100ML-% IV SOLN
2.0000 g | Freq: Once | INTRAVENOUS | Status: AC
  Administered 2021-09-05: 2000 mg via INTRAVENOUS
  Filled 2021-09-05: qty 100

## 2021-09-05 MED ORDER — CHLORHEXIDINE GLUCONATE CLOTH 2 % EX PADS
6.0000 | MEDICATED_PAD | Freq: Every day | CUTANEOUS | Status: DC
  Administered 2021-09-05 – 2021-09-08 (×3): 6 via TOPICAL

## 2021-09-05 MED ORDER — NYSTATIN 100000 UNIT/ML MT SUSP
5.0000 mL | Freq: Four times a day (QID) | OROMUCOSAL | Status: DC
  Administered 2021-09-05 – 2021-09-08 (×13): 500000 [IU] via ORAL
  Filled 2021-09-05 (×13): qty 5

## 2021-09-05 MED ORDER — DIPHENHYDRAMINE HCL 25 MG PO CAPS
25.0000 mg | ORAL_CAPSULE | Freq: Once | ORAL | Status: AC
Start: 2021-09-05 — End: 2021-09-05
  Administered 2021-09-05: 25 mg via ORAL
  Filled 2021-09-05: qty 1

## 2021-09-05 MED ORDER — FLUCONAZOLE 50 MG PO TABS
150.0000 mg | ORAL_TABLET | Freq: Once | ORAL | Status: AC
  Administered 2021-09-05: 150 mg via ORAL
  Filled 2021-09-05: qty 1

## 2021-09-05 MED ORDER — LACTATED RINGERS IV SOLN: INTRAVENOUS | Status: DC

## 2021-09-05 MED ORDER — MAGNESIUM SULFATE 2 GM/50ML IV SOLN
2.0000 g | Freq: Once | INTRAVENOUS | Status: AC
  Administered 2021-09-05: 2 g via INTRAVENOUS
  Filled 2021-09-05: qty 50

## 2021-09-05 MED ORDER — POTASSIUM CHLORIDE 10 MEQ/100ML IV SOLN
10.0000 meq | INTRAVENOUS | Status: AC
  Administered 2021-09-05 (×4): 10 meq via INTRAVENOUS
  Filled 2021-09-05 (×4): qty 100

## 2021-09-05 MED ORDER — ACETAMINOPHEN 325 MG PO TABS
650.0000 mg | ORAL_TABLET | Freq: Once | ORAL | Status: AC
Start: 2021-09-05 — End: 2021-09-05
  Administered 2021-09-05: 650 mg via ORAL
  Filled 2021-09-05: qty 2

## 2021-09-05 MED ORDER — SODIUM CHLORIDE 0.9% IV SOLUTION: Freq: Once | INTRAVENOUS | Status: AC

## 2021-09-05 NOTE — Consult Note (Signed)
[facility]  [phone] [phone]  ID: Philomena Doheny OB: October 12, 1960  MR#: [id number]  AOZ#:[id number]  Patient Care Team: Fayrene Helper, NP as PCP - General (Nurse Practitioner)  CHIEF COMPLAINT: Syncope and pancytopenia secondary to chemotherapy.  INTERVAL HISTORY: Patient is a 61 year old female actively receiving chemotherapy for triple negative breast cancer with her last infusion on August 24, 2021.  She presented to the emergency room after increasing weakness and fatigue and a syncopal fall in her bathroom where she lost consciousness.  She also has significant pancytopenia from her recent chemotherapy.  Currently, she feels improved since admission but not back to her baseline.  She has no further neurologic complaints.  She denies any fevers.  She has a poor appetite and weight loss.  She has no chest pain, shortness of breath, cough, or hemoptysis.  She denies any nausea, vomiting, constipation, or diarrhea.  She has no urinary complaints.  Patient offers no further specific complaints today.  REVIEW OF SYSTEMS:   Review of Systems  Constitutional:  Positive for malaise/fatigue and weight loss. Negative for fever.  Respiratory: Negative.  Negative for cough, hemoptysis and shortness of breath.   Cardiovascular: Negative.  Negative for chest pain and leg swelling.  Gastrointestinal: Negative.  Negative for abdominal pain.  Genitourinary: Negative.  Negative for dysuria.  Musculoskeletal:  Positive for falls. Negative for back pain.  Skin: Negative.  Negative for rash.  Neurological:  Positive for dizziness and weakness. Negative for focal weakness and headaches.  Psychiatric/Behavioral: Negative.  The patient is not nervous/anxious.     As per HPI. Otherwise, a complete review of systems is negative.  PAST MEDICAL HISTORY: Past Medical History:  Diagnosis Date   Anxiety    Aortic atherosclerosis (HCC) 10/24/2016   Breast cancer  (HCC)    GERD (gastroesophageal reflux disease)    Personal history of chemotherapy     PAST SURGICAL HISTORY: Past Surgical History:  Procedure Laterality Date   BREAST BIOPSY      FAMILY HISTORY: Family History  Problem Relation Age of Onset   Cancer Mother 28       breast   Cancer Father        melonma   Cirrhosis Maternal Grandfather    Cancer Paternal Grandmother        skin/breast    ADVANCED DIRECTIVES (Y/N):  @ADVDIR @  HEALTH MAINTENANCE: Social History   Tobacco Use   Smoking status: Every Day    Packs/day: 1.00    Years: 36.00    Total pack years: 36.00    Types: Cigarettes    Start date: 03/15/1979   Smokeless tobacco: Never  Vaping Use   Vaping Use: Never used  Substance Use Topics   Alcohol use: No    Alcohol/week: 3.0 standard drinks of alcohol    Types: 3 Standard drinks or equivalent per week   Drug use: Not Currently     Colonoscopy:  PAP:  Bone density:  Lipid panel:  No Known Allergies  Current Facility-Administered Medications  Medication Dose Route Frequency Provider Last Rate Last Admin   0.9 %  sodium chloride infusion   Intravenous Continuous Mikey College T, MD 100 mL/hr at 09/05/21 0320 New Bag at 09/05/21 0320   calcium gluconate 2 g/ 100 mL sodium chloride IVPB  2 g Intravenous Once Lynn Ito, MD       feeding supplement (ENSURE ENLIVE / ENSURE PLUS) liquid 237 mL  237 mL Oral BID BM Zhang,  Renae Fickle, MD   237 mL at 09/05/21 0943   fluconazole (DIFLUCAN) tablet 150 mg  150 mg Oral Once Lynn Ito, MD       LORazepam (ATIVAN) tablet 0.5 mg  0.5 mg Oral Q12H PRN Mikey College T, MD       magnesium chloride (SLOW-MAG) 64 MG SR tablet 64 mg  1 tablet Oral BID Mikey College T, MD   64 mg at 09/05/21 0347   multivitamin with minerals tablet 1 tablet  1 tablet Oral Daily Mikey College T, MD   1 tablet at 09/05/21 4259   nystatin (MYCOSTATIN) 100000 UNIT/ML suspension 500,000 Units  5 mL Oral QID Lynn Ito, MD   500,000 Units at 09/05/21  0942   pantoprazole (PROTONIX) EC tablet 40 mg  40 mg Oral Daily Mikey College T, MD   40 mg at 09/05/21 0942   potassium chloride 10 mEq in 100 mL IVPB  10 mEq Intravenous Carollee Herter, Freddi Che, MD 50 mL/hr at 09/05/21 1055 10 mEq at 09/05/21 1055   potassium chloride SA (KLOR-CON M) CR tablet 20 mEq  20 mEq Oral TID Mikey College T, MD   20 mEq at 09/05/21 0942   sucralfate (CARAFATE) tablet 1 g  1 g Oral TID WC & HS Mikey College T, MD   1 g at 09/05/21 0820   Facility-Administered Medications Ordered in Other Encounters  Medication Dose Route Frequency Provider Last Rate Last Admin   famotidine (PEPCID) 20-0.9 MG/50ML-% IVPB             OBJECTIVE: Vitals:   09/05/21 1025 09/05/21 1226  BP: (!) 88/51 (!) 93/56  Pulse: 93 89  Resp: 16 16  Temp: 98.1 F (36.7 C) 98.4 F (36.9 C)  SpO2: 99% 99%     Body mass index is 17.9 kg/m.    ECOG FS:3 - Symptomatic, >50% confined to bed  General: Well-developed, well-nourished, no acute distress. Eyes: Pink conjunctiva, anicteric sclera. HEENT: Normocephalic, moist mucous membranes. Lungs: No audible wheezing or coughing. Heart: Regular rate and rhythm. Abdomen: Soft, nontender, no obvious distention. Musculoskeletal: No edema, cyanosis, or clubbing. Neuro: Alert, answering all questions appropriately. Cranial nerves grossly intact. Skin: No rashes or petechiae noted. Psych: Normal affect.   LAB RESULTS:  Lab Results  Component Value Date   NA 136 09/05/2021   K 2.6 (LL) 09/05/2021   CL 117 (H) 09/05/2021   CO2 16 (L) 09/05/2021   GLUCOSE 78 09/05/2021   BUN 8 09/05/2021   CREATININE 0.34 (L) 09/05/2021   CALCIUM 6.4 (LL) 09/05/2021   PROT 6.3 (L) 09/04/2021   ALBUMIN 3.2 (L) 09/04/2021   AST 15 09/04/2021   ALT 13 09/04/2021   ALKPHOS 60 09/04/2021   BILITOT 0.7 09/04/2021   GFRNONAA >60 09/05/2021   GFRAA >60 12/15/2019    Lab Results  Component Value Date   WBC 3.7 (L) 09/05/2021   NEUTROABS 1.3 (L) 09/04/2021   HGB 5.5 (L)  09/05/2021   HCT 16.5 (L) 09/05/2021   MCV 96.5 09/05/2021   PLT 23 (LL) 09/05/2021     STUDIES: CT HEAD WO CONTRAST ( )  Result Date: 09/04/2021 CLINICAL DATA:  Fall. EXAM: CT HEAD WITHOUT CONTRAST CT CERVICAL SPINE WITHOUT CONTRAST TECHNIQUE: Multidetector CT imaging of the head and cervical spine was performed following the standard protocol without intravenous contrast. Multiplanar CT image reconstructions of the cervical spine were also generated. RADIATION DOSE REDUCTION: This exam was performed according to the departmental dose-optimization program which includes  automated exposure control, adjustment of the mA and/or kV according to patient size and/or use of iterative reconstruction technique. COMPARISON:  CT head 02/22/2020 FINDINGS: CT HEAD FINDINGS Brain: Ventricle size normal. Mild to moderate patchy white matter hypodensity bilaterally appears chronic. Negative for acute infarct or hemorrhage 1 cm round hyperdense extra-axial mass right parietal convexity unchanged from 2021 compatible with meningioma. No brain edema. Vascular: Negative for hyperdense vessel Skull: Negative Sinuses/Orbits: Mild mucosal edema paranasal sinuses. Mastoid clear. Negative orbit Other: None CT CERVICAL SPINE FINDINGS Alignment: Mild anterolisthesis C3-4 and C4-5.  Cervical kyphosis. Skull base and vertebrae: Negative for fracture Soft tissues and spinal canal: Atherosclerotic calcification the carotid artery bilaterally. No soft tissue mass. Port-A-Cath on the left. Disc levels: Mild disc degeneration in the cervical spine. No significant stenosis. Upper chest: Mild apical emphysema.  No acute abnormality Other: None IMPRESSION: 1. No acute intracranial injury. 2. 1 cm meningioma over the right parietal convexity unchanged. 3. Negative for cervical spine fracture. Electronically Signed   By: Marlan Palau M.D.   On: 09/04/2021 13:53   CT Cervical Spine Wo Contrast  Result Date: 09/04/2021 CLINICAL DATA:   Fall. EXAM: CT HEAD WITHOUT CONTRAST CT CERVICAL SPINE WITHOUT CONTRAST TECHNIQUE: Multidetector CT imaging of the head and cervical spine was performed following the standard protocol without intravenous contrast. Multiplanar CT image reconstructions of the cervical spine were also generated. RADIATION DOSE REDUCTION: This exam was performed according to the departmental dose-optimization program which includes automated exposure control, adjustment of the mA and/or kV according to patient size and/or use of iterative reconstruction technique. COMPARISON:  CT head 02/22/2020 FINDINGS: CT HEAD FINDINGS Brain: Ventricle size normal. Mild to moderate patchy white matter hypodensity bilaterally appears chronic. Negative for acute infarct or hemorrhage 1 cm round hyperdense extra-axial mass right parietal convexity unchanged from 2021 compatible with meningioma. No brain edema. Vascular: Negative for hyperdense vessel Skull: Negative Sinuses/Orbits: Mild mucosal edema paranasal sinuses. Mastoid clear. Negative orbit Other: None CT CERVICAL SPINE FINDINGS Alignment: Mild anterolisthesis C3-4 and C4-5.  Cervical kyphosis. Skull base and vertebrae: Negative for fracture Soft tissues and spinal canal: Atherosclerotic calcification the carotid artery bilaterally. No soft tissue mass. Port-A-Cath on the left. Disc levels: Mild disc degeneration in the cervical spine. No significant stenosis. Upper chest: Mild apical emphysema.  No acute abnormality Other: None IMPRESSION: 1. No acute intracranial injury. 2. 1 cm meningioma over the right parietal convexity unchanged. 3. Negative for cervical spine fracture. Electronically Signed   By: Marlan Palau M.D.   On: 09/04/2021 13:53    ASSESSMENT: Syncope and pancytopenia secondary to chemotherapy.  PLAN:    Syncope: Likely multifactorial with worsening weakness and fatigue, poor appetite and p.o. intake leading to dehydration, and pancytopenia.  Patient feels her symptoms  are improved, but has not yet tried to walk since admission.  Continue IV fluids as ordered. Leukopenia: Improving.  Secondary to chemotherapy.  Patient received Udenyca approximately 10 days ago. Thrombocytopenia: Secondary to chemotherapy.  Improved with transfusion.  Continue to monitor and transfuse again if platelet count falls below 20,000. Anemia: Secondary to chemotherapy, although poor nutrition is also contributing.  Patient to receive 2 units packed red blood cells today. Thrush: Patient currently on fluconazole. Electrolytes: Patient currently receiving IV magnesium and IV calcium, and IV potassium.  Appreciate consult, will follow.    Jeralyn Ruths, MD   09/05/2021 12:29 PM

## 2021-09-06 ENCOUNTER — Telehealth: Payer: Self-pay | Admitting: Oncology

## 2021-09-06 ENCOUNTER — Other Ambulatory Visit: Payer: Self-pay | Admitting: Emergency Medicine

## 2021-09-06 DIAGNOSIS — C50911 Malignant neoplasm of unspecified site of right female breast: Secondary | ICD-10-CM

## 2021-09-06 DIAGNOSIS — R55 Syncope and collapse: Secondary | ICD-10-CM | POA: Diagnosis not present

## 2021-09-06 DIAGNOSIS — R1310 Dysphagia, unspecified: Secondary | ICD-10-CM | POA: Diagnosis not present

## 2021-09-06 DIAGNOSIS — D6481 Anemia due to antineoplastic chemotherapy: Secondary | ICD-10-CM | POA: Diagnosis not present

## 2021-09-06 DIAGNOSIS — D696 Thrombocytopenia, unspecified: Secondary | ICD-10-CM | POA: Diagnosis not present

## 2021-09-06 DIAGNOSIS — E876 Hypokalemia: Secondary | ICD-10-CM | POA: Diagnosis not present

## 2021-09-06 LAB — CBC
HCT: 31.2 % — ABNORMAL LOW (ref 36.0–46.0)
Hemoglobin: 10.8 g/dL — ABNORMAL LOW (ref 12.0–15.0)
MCH: 29.8 pg (ref 26.0–34.0)
MCHC: 34.6 g/dL (ref 30.0–36.0)
MCV: 86.2 fL (ref 80.0–100.0)
Platelets: 28 10*3/uL — CL (ref 150–400)
RBC: 3.62 MIL/uL — ABNORMAL LOW (ref 3.87–5.11)
RDW: 18.1 % — ABNORMAL HIGH (ref 11.5–15.5)
WBC: 8.9 10*3/uL (ref 4.0–10.5)
nRBC: 0.2 % (ref 0.0–0.2)

## 2021-09-06 LAB — TYPE AND SCREEN
ABO/RH(D): O POS
Antibody Screen: NEGATIVE
Unit division: 0
Unit division: 0

## 2021-09-06 LAB — BPAM RBC
Blood Product Expiration Date: 202307182359
Blood Product Expiration Date: 202307242359
ISSUE DATE / TIME: 202306251001
ISSUE DATE / TIME: 202306251234
Unit Type and Rh: 5100
Unit Type and Rh: 5100

## 2021-09-06 LAB — BASIC METABOLIC PANEL
Anion gap: 3 — ABNORMAL LOW (ref 5–15)
Anion gap: 4 — ABNORMAL LOW (ref 5–15)
BUN: 8 mg/dL (ref 8–23)
BUN: <5 mg/dL — ABNORMAL LOW (ref 8–23)
CO2: 16 mmol/L — ABNORMAL LOW (ref 22–32)
CO2: 22 mmol/L (ref 22–32)
Calcium: 6.4 mg/dL — CL (ref 8.9–10.3)
Calcium: 8.5 mg/dL — ABNORMAL LOW (ref 8.9–10.3)
Chloride: 117 mmol/L — ABNORMAL HIGH (ref 98–111)
Chloride: 110 mmol/L (ref 98–111)
Creatinine, Ser: 0.34 mg/dL — ABNORMAL LOW (ref 0.44–1.00)
Creatinine, Ser: 0.4 mg/dL — ABNORMAL LOW (ref 0.44–1.00)
GFR, Estimated: >60 mL/min (ref >60)
GFR, Estimated: >60 mL/min (ref >60)
Glucose, Bld: 78 mg/dL (ref 70–99)
Glucose, Bld: 88 mg/dL (ref 70–99)
Potassium: 2.6 mmol/L — CL (ref 3.5–5.1)
Potassium: 3.6 mmol/L (ref 3.5–5.1)
Sodium: 136 mmol/L (ref 135–145)
Sodium: 136 mmol/L (ref 135–145)

## 2021-09-06 LAB — MAGNESIUM: Magnesium: 1.4 mg/dL — ABNORMAL LOW (ref 1.7–2.4)

## 2021-09-06 MED ORDER — MAGNESIUM SULFATE 2 GM/50ML IV SOLN
2.0000 g | Freq: Once | INTRAVENOUS | Status: AC
  Administered 2021-09-06: 2 g via INTRAVENOUS
  Filled 2021-09-06: qty 50

## 2021-09-06 MED ORDER — ACETAMINOPHEN 325 MG PO TABS
650.0000 mg | ORAL_TABLET | Freq: Once | ORAL | Status: AC
Start: 2021-09-06 — End: 2021-09-06
  Administered 2021-09-06: 650 mg via ORAL
  Filled 2021-09-06: qty 2

## 2021-09-06 MED ORDER — POTASSIUM CHLORIDE CRYS ER 20 MEQ PO TBCR
20.0000 meq | EXTENDED_RELEASE_TABLET | Freq: Two times a day (BID) | ORAL | Status: AC
  Administered 2021-09-06 (×2): 20 meq via ORAL
  Filled 2021-09-06 (×2): qty 1

## 2021-09-06 MED ORDER — ENSURE ENLIVE PO LIQD
237.0000 mL | Freq: Three times a day (TID) | ORAL | Status: DC
  Administered 2021-09-06 – 2021-09-08 (×5): 237 mL via ORAL

## 2021-09-06 MED ORDER — ACETAMINOPHEN 325 MG PO TABS
650.0000 mg | ORAL_TABLET | Freq: Once | ORAL | Status: AC
  Administered 2021-09-06: 650 mg via ORAL
  Filled 2021-09-06: qty 2

## 2021-09-06 NOTE — Progress Notes (Signed)
Initial Nutrition Assessment  DOCUMENTATION CODES:   Severe malnutrition in context of chronic illness, Underweight  INTERVENTION:  Encourage adequate PO intake Ensure Enlive po TID, each supplement provides 350 kcal and 20 grams of protein. MVI with minerals daily Consider long term nutrition access dependent upon GOC Recommend SLP evaluation of ongoing swallowing difficulties  NUTRITION DIAGNOSIS:   Severe Malnutrition related to chronic illness (breast cancer) as evidenced by severe fat depletion, severe muscle depletion, percent weight loss, energy intake < or equal to 75% for > or equal to 1 month.  GOAL:   Patient will meet greater than or equal to 90% of their needs  MONITOR:   PO intake, Supplement acceptance, Diet advancement, Labs, Weight trends  REASON FOR ASSESSMENT:   Consult Assessment of nutrition requirement/status, Calorie Count  ASSESSMENT:   Pt admitted from home with syncope. PMH significant for R sided breast cancer stage IIIb on chemo/immunotherapy, pancytopenia secondary to chemotherapy, peripheral neuropathy, GERD and cigarette smoker.  Pt reports that her appetite has been up and down since beginning chemotherapy in January. She states that more recently, she started an antibiotic on Tuesday and since then she has not had an appetite at all. Pt has been followed by the Dietitian at the cancer center who has recommended high calorie, high protein nutritional intake and consistent nutritional intake every 2 hours.   She has had ongoing mouth sores and dysphagia secondary to her chemotherapy. Pt has tried magic mouthwash and noted she is receiving medication for treatment of thrush. Pt reports that she feels as though her tongue is split in half and has had no relief with mouth rinse or medications. She believes that if she could find relief, her intake make improve. She also endorses sensitivity to cold food and beverages. Pt is receiving Ensure during  admission. She previously believed these were contributing to her diarrhea however states that she was receiving magnesium which was contributing to GI symptoms.    Briefly discussed long term nutrition access via PEG given ongoing difficulty tolerating foods and continued wt loss. 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 She is open to considering dependent upon her treatment goals going forward. She questioned whether she desires next round of chemotherapy. Encouraged her to have ongoing discussions with her Oncologist and care providers.   Pt states that she has continued to lose wt. She did not provide her initial wt but reports a current wt of 80 lbs. Reviewed wt history. It appears that she has had a 12% wt loss within the last 6 months which is clinically significant for time frame.   Medications: slow-mag, MVI, nystatin, protonix, klor-con, sodium bicarbonate, carafate, IV Mg sulfate  NUTRITION - FOCUSED PHYSICAL EXAM:  Flowsheet Row Most Recent Value  Orbital Region Severe depletion  Upper Arm Region Severe depletion  Thoracic and Lumbar Region Severe depletion  Buccal Region Moderate depletion  Temple Region Severe depletion  Clavicle Bone Region Severe depletion  Clavicle and Acromion Bone Region Severe depletion  Scapular Bone Region Moderate depletion  Dorsal Hand Moderate depletion  Patellar Region Severe depletion  Anterior Thigh Region Severe depletion  Posterior Calf Region Severe depletion  Edema (RD Assessment) None  Hair Reviewed  Eyes Reviewed  Mouth Reviewed  Skin Reviewed  Nails Reviewed      Diet Order:   Diet Order             Diet full liquid Room service appropriate?  Yes; Fluid consistency: Thin  Diet effective now                   EDUCATION NEEDS:   Education needs have been addressed  Skin:  Skin Assessment: Reviewed RN Assessment  Last BM:  6/25 (type 7)  Height:   Ht Readings from Last 1  Encounters:  09/04/21 5\' 2"  (1.575 m)    Weight:   Wt Readings from Last 1 Encounters:  09/04/21 44.4 kg    Ideal Body Weight:  50 kg  BMI:  Body mass index is 17.9 kg/m.  Estimated Nutritional Needs:   Kcal:  1550-1750  Protein:  75-90g  Fluid:  >/=1.5L  Drusilla Kanner, RDN, LDN Clinical Nutrition

## 2021-09-07 ENCOUNTER — Encounter: Payer: Self-pay | Admitting: Oncology

## 2021-09-07 ENCOUNTER — Inpatient Hospital Stay: Payer: Medicaid Other

## 2021-09-07 ENCOUNTER — Inpatient Hospital Stay: Payer: Medicaid Other | Admitting: Medical Oncology

## 2021-09-07 DIAGNOSIS — D6481 Anemia due to antineoplastic chemotherapy: Secondary | ICD-10-CM | POA: Diagnosis not present

## 2021-09-07 DIAGNOSIS — E43 Unspecified severe protein-calorie malnutrition: Secondary | ICD-10-CM | POA: Insufficient documentation

## 2021-09-07 DIAGNOSIS — R1310 Dysphagia, unspecified: Secondary | ICD-10-CM | POA: Diagnosis not present

## 2021-09-07 DIAGNOSIS — E876 Hypokalemia: Secondary | ICD-10-CM | POA: Diagnosis not present

## 2021-09-07 DIAGNOSIS — T451X5A Adverse effect of antineoplastic and immunosuppressive drugs, initial encounter: Secondary | ICD-10-CM

## 2021-09-07 DIAGNOSIS — R55 Syncope and collapse: Secondary | ICD-10-CM | POA: Diagnosis not present

## 2021-09-07 LAB — CALCIUM, IONIZED: Calcium, Ionized, Serum: 5.2 mg/dL (ref 4.5–5.6)

## 2021-09-07 LAB — CBC
HCT: 35.5 % — ABNORMAL LOW (ref 36.0–46.0)
Hemoglobin: 12 g/dL (ref 12.0–15.0)
MCH: 29.3 pg (ref 26.0–34.0)
MCHC: 33.8 g/dL (ref 30.0–36.0)
MCV: 86.6 fL (ref 80.0–100.0)
Platelets: 41 10*3/uL — ABNORMAL LOW (ref 150–400)
RBC: 4.1 MIL/uL (ref 3.87–5.11)
RDW: 18 % — ABNORMAL HIGH (ref 11.5–15.5)
WBC: 9.1 10*3/uL (ref 4.0–10.5)
nRBC: 0 % (ref 0.0–0.2)

## 2021-09-07 LAB — MAGNESIUM: Magnesium: 1.1 mg/dL — ABNORMAL LOW (ref 1.7–2.4)

## 2021-09-07 MED ORDER — MAGIC MOUTHWASH W/LIDOCAINE
5.0000 mL | Freq: Four times a day (QID) | ORAL | Status: DC | PRN
  Administered 2021-09-07: 5 mL via ORAL
  Filled 2021-09-07 (×2): qty 5

## 2021-09-07 MED ORDER — MAGNESIUM SULFATE 4 GM/100ML IV SOLN
4.0000 g | Freq: Once | INTRAVENOUS | Status: AC
  Administered 2021-09-07: 4 g via INTRAVENOUS
  Filled 2021-09-07: qty 100

## 2021-09-07 MED ORDER — ACETAMINOPHEN 325 MG PO TABS
650.0000 mg | ORAL_TABLET | Freq: Once | ORAL | Status: AC
Start: 2021-09-07 — End: 2021-09-07
  Administered 2021-09-07: 650 mg via ORAL
  Filled 2021-09-07: qty 2

## 2021-09-07 MED ORDER — FLUCONAZOLE 50 MG PO TABS
150.0000 mg | ORAL_TABLET | Freq: Once | ORAL | Status: AC
  Administered 2021-09-07: 150 mg via ORAL
  Filled 2021-09-07: qty 1

## 2021-09-08 ENCOUNTER — Other Ambulatory Visit: Payer: Self-pay | Admitting: Oncology

## 2021-09-08 ENCOUNTER — Telehealth: Payer: Self-pay

## 2021-09-08 DIAGNOSIS — C50911 Malignant neoplasm of unspecified site of right female breast: Secondary | ICD-10-CM

## 2021-09-08 LAB — POTASSIUM: Potassium: 3.4 mmol/L — ABNORMAL LOW (ref 3.5–5.1)

## 2021-09-08 LAB — MAGNESIUM: Magnesium: 1.8 mg/dL (ref 1.7–2.4)

## 2021-09-08 MED ORDER — HYDROCODONE-ACETAMINOPHEN 5-325 MG PO TABS
1.0000 | ORAL_TABLET | Freq: Two times a day (BID) | ORAL | 0 refills | Status: DC | PRN
Start: 2021-09-08 — End: 2021-10-14

## 2021-09-08 MED ORDER — HEPARIN SOD (PORK) LOCK FLUSH 100 UNIT/ML IV SOLN
500.0000 [IU] | Freq: Once | INTRAVENOUS | Status: AC
  Administered 2021-09-08: 500 [IU] via INTRAVENOUS
  Filled 2021-09-08: qty 5

## 2021-09-08 MED ORDER — GABAPENTIN 100 MG PO CAPS: 100.0000 mg | ORAL_CAPSULE | Freq: Three times a day (TID) | ORAL | Status: DC

## 2021-09-08 MED ORDER — OYSTER SHELL CALCIUM/D3 500-5 MG-MCG PO TABS: 2.0000 | ORAL_TABLET | Freq: Every day | ORAL | Status: DC

## 2021-09-08 MED ORDER — NYSTATIN 100000 UNIT/ML MT SUSP: 5.0000 mL | Freq: Four times a day (QID) | OROMUCOSAL | 0 refills | Status: DC

## 2021-09-08 MED ORDER — BACITRACIN-NEOMYCIN-POLYMYXIN OINTMENT TUBE
TOPICAL_OINTMENT | CUTANEOUS | Status: DC | PRN
  Filled 2021-09-08: qty 14.17

## 2021-09-08 MED ORDER — ENSURE ENLIVE PO LIQD: 237.0000 mL | Freq: Three times a day (TID) | ORAL | 12 refills | Status: DC

## 2021-09-08 MED ORDER — POTASSIUM CHLORIDE CRYS ER 20 MEQ PO TBCR: 20.0000 meq | EXTENDED_RELEASE_TABLET | Freq: Three times a day (TID) | ORAL | 1 refills | Status: DC

## 2021-09-08 MED ORDER — NICOTINE 7 MG/24HR TD PT24: 7.0000 mg | MEDICATED_PATCH | Freq: Every day | TRANSDERMAL | 0 refills | Status: DC

## 2021-09-08 MED ORDER — POTASSIUM CHLORIDE CRYS ER 20 MEQ PO TBCR
20.0000 meq | EXTENDED_RELEASE_TABLET | Freq: Once | ORAL | Status: AC
Start: 2021-09-08 — End: 2021-09-08
  Administered 2021-09-08: 20 meq via ORAL
  Filled 2021-09-08: qty 1

## 2021-09-08 MED ORDER — BACITRACIN-NEOMYCIN-POLYMYXIN OINTMENT TUBE: 1.0000 | TOPICAL_OINTMENT | Freq: Two times a day (BID) | CUTANEOUS | 0 refills | Status: DC

## 2021-09-08 MED ORDER — POTASSIUM CHLORIDE CRYS ER 20 MEQ PO TBCR: 40.0000 meq | EXTENDED_RELEASE_TABLET | Freq: Once | ORAL | Status: DC

## 2021-09-08 NOTE — TOC Transition Note (Signed)
Transition of Care [facility]) - CM/SW Discharge Note   Patient Details  Name: Yvonne Scott MRN: [id number] Date of Birth: Nov 12, 1960  Transition of Care [facility]) CM/SW Contact:  Donnelly Angelica, LCSW Phone Number: 09/08/2021, 2:24 PM   Clinical Narrative:    Patient had orders to discharge today. No TOC needs identified. CSW signing off.    Final next level of care: Home/Self Care Barriers to Discharge: Barriers Resolved   Patient Goals and CMS Choice        Discharge Placement                    Patient and family notified of of transfer: 09/08/21  Discharge Plan and Services                                     Social Determinants of Health (SDOH) Interventions     Readmission Risk Interventions     No data to display

## 2021-09-08 NOTE — TOC Progression Note (Signed)
Transition of Care [facility]) - Progression Note    Patient Details  Name: Yvonne Scott MRN: [id number] Date of Birth: 03-Jun-1960  Transition of Care [facility]) CM/SW Contact  Donnelly Angelica, LCSW Phone Number: 09/08/2021, 6:52 AM  Clinical Narrative:    CSW reviewed chart. Per MD, we are still awaiting oncology results. No identified needs at this time.         Expected Discharge Plan and Services                                                 Social Determinants of Health (SDOH) Interventions    Readmission Risk Interventions     No data to display

## 2021-09-08 NOTE — TOC Progression Note (Signed)
Transition of Care [facility]) - Progression Note    Patient Details  Name: Yvonne Scott MRN: [id number] Date of Birth: 1960-10-07  Transition of Care [facility]) CM/SW Contact  Donnelly Angelica, LCSW Phone Number: 09/08/2021, 1:13 PM  Clinical Narrative:    CSW reached out to [facility] from [place] regarding a hospital bed for patient. Awaiting response.        Expected Discharge Plan and Services                                                 Social Determinants of Health (SDOH) Interventions    Readmission Risk Interventions     No data to display

## 2021-09-08 NOTE — Telephone Encounter (Signed)
Orders entered. Please schedule mammo/US and follow as requested by MD an inform pt of appt.

## 2021-09-08 NOTE — Discharge Summary (Signed)
Physician Discharge Summary   Patient: Yvonne Scott MRN: [id number] DOB: 1960-10-09  Admit date:     09/04/2021  Discharge date: 09/08/21  Discharge Physician: Fritzi Mandes   PCP: [name], NP   Recommendations at discharge:    F/u Dr Tasia Catchings on your scheduled appt F/u dr Windell Moment regarding definitive treatment F/u PCP in 1-2 weeks  Discharge Diagnoses: Pancytopenia post chemotherapy Oral thrush/odynophagia--improving Electrolyte abnormality Severe protein calorie malnutrion  Hospital Course:  Yvonne Scott is a 61 y.o. female with medical history significant of right-sided breast cancer stage IIIb, on chemo/immunotherapy with Keytruda, pancytopenia secondary to chemotherapy, peripheral neuropathy secondary to chemotherapy, GERD, cigarette smoker, presented with syncope.   Patient has been receiving Keytruda as chemo/immunotherapy since January of this year, last session was 2 weeks ago.  Syncope with fall multifactorial suspected electrolyte imbalance, malnutrition, volume contraction -- overall improving. Ambulating without any difficulty. No PT needs per PT evaluation  Pancytopenia suspected due to chemotherapy -- counts are stable now. Status post blood transfusion. -- Seen by oncology Dr. Tasia Catchings-- okay to discharge from her standpoint  electrolyte abnormality -- replaced  severe protein calorie malnutrition in the setting of chemotherapy -- continue nutritional supplement  history of locally advanced right invasive ductal carcinoma stage IIIB currently on neoadjuvant chemotherapy -- patient to discuss definitive surgical options as outpatient with Dr. Windell Moment. -- Per Dr. Tasia Catchings chemotherapy will be held for now. Should follow-up closely as outpatient.  Overall improving.Will discharge patient to home. Patient is in agreement     Consultants: oncology Procedures performed: none Disposition: Home health Diet recommendation:  Discharge Diet Orders (From  admission, onward)     Start     Ordered   09/08/21 0000  Diet - low sodium heart healthy        09/08/21 1353            DISCHARGE MEDICATION: Allergies as of 09/08/2021   No Known Allergies      Medication List     TAKE these medications    calcium carbonate 750 MG chewable tablet Commonly known as: TUMS EX Chew 2 tablets by mouth 4 (four) times daily as needed for heartburn.   calcium-vitamin D 500-5 MG-MCG tablet Commonly known as: OSCAL WITH D Take 2 tablets by mouth daily.   dexamethasone 4 MG tablet Commonly known as: DECADRON TAKE 2 TABLETS BY MOUTH ONCE DAILY FOR 2 DAYS AFTER  CHEMOTHERAPY.  TAKE  WITH  FOOD.   feeding supplement Liqd Take 237 mLs by mouth 3 (three) times daily between meals.   gabapentin 100 MG capsule Commonly known as: Neurontin Take 1 capsule (100 mg total) by mouth 3 (three) times daily.   HYDROcodone-acetaminophen 5-325 MG tablet Commonly known as: Norco Take 1 tablet by mouth every 12 (twelve) hours as needed for moderate pain. What changed: when to take this   ibuprofen 200 MG tablet Commonly known as: ADVIL Take 200-400 mg by mouth every 6 (six) hours as needed for headache.   lidocaine-prilocaine cream Commonly known as: EMLA Apply to affected area once   LORazepam 0.5 MG tablet Commonly known as: ATIVAN Take 1 tablet (0.5 mg total) by mouth every 12 (twelve) hours as needed for anxiety or sleep (or nausea).   magic mouthwash (multi-ingredient) oral suspension Take 5 mLs by mouth 4 (four) times daily as needed for mouth pain.   magnesium chloride 64 MG Tbec SR tablet Commonly known as: SLOW-MAG Take 1 tablet (64 mg total) by  mouth 2 (two) times daily.   multivitamin with minerals Tabs tablet Take 1 tablet by mouth daily.   neomycin-bacitracin-polymyxin Oint Commonly known as: NEOSPORIN Apply 1 Application topically 2 (two) times daily.   nicotine 7 mg/24hr patch Commonly known as: NICODERM CQ - dosed in mg/24  hr Place 1 patch (7 mg total) onto the skin daily.   nystatin 100000 UNIT/ML suspension Commonly known as: MYCOSTATIN Take 5 mLs (500,000 Units total) by mouth 4 (four) times daily.   ondansetron 8 MG tablet Commonly known as: ZOFRAN TAKE 1 TABLET BY MOUTH TWICE DAILY AS NEEDED -  START  THE  3RD  DAY  AFTER  CARBOPLATIN  AND  AC  CHEMOTHERAPY   pantoprazole 40 MG tablet Commonly known as: Protonix Take 1 tablet (40 mg total) by mouth daily.   potassium chloride SA 20 MEQ tablet Commonly known as: KLOR-CON M Take 1 tablet (20 mEq total) by mouth 3 (three) times daily.   sucralfate 1 g tablet Commonly known as: Carafate Take 1 tablet (1 g total) by mouth 4 (four) times daily -  with meals and at bedtime.   Ventolin HFA 108 (90 Base) MCG/ACT inhaler Generic drug: albuterol Inhale 1-2 puffs into the lungs every 6 (six) hours as needed for wheezing or shortness of breath.        Follow-up Information     [name], NP. Schedule an appointment as soon as possible for a visit in 1 week(s).   Specialty: Nurse Practitioner Contact information: [address] [place] [address] [phone]         Earlie Server, MD. Go to.   Specialty: Oncology Why: on your appt per Cancer center Contact information: [place] [address] [phone]         Herbert Pun, MD. Schedule an appointment as soon as possible for a visit in 1 week(s).   Specialty: General Surgery Why: breast cancer Contact information: [place] [address] [phone]                Discharge Exam: Danley Danker Weights   09/04/21 1309  Weight: 44.4 kg     Condition at discharge: fair  The results of significant diagnostics from this hospitalization (including imaging, microbiology, ancillary and laboratory) are listed below for reference.   Imaging Studies: CT HEAD WO CONTRAST (5MM)  Result Date: 09/04/2021 CLINICAL DATA:  Fall.  EXAM: CT HEAD WITHOUT CONTRAST CT CERVICAL SPINE WITHOUT CONTRAST TECHNIQUE: Multidetector CT imaging of the head and cervical spine was performed following the standard protocol without intravenous contrast. Multiplanar CT image reconstructions of the cervical spine were also generated. RADIATION DOSE REDUCTION: This exam was performed according to the departmental dose-optimization program which includes automated exposure control, adjustment of the mA and/or kV according to patient size and/or use of iterative reconstruction technique. COMPARISON:  CT head 02/22/2020 FINDINGS: CT HEAD FINDINGS Brain: Ventricle size normal. Mild to moderate patchy white matter hypodensity bilaterally appears chronic. Negative for acute infarct or hemorrhage 1 cm round hyperdense extra-axial mass right parietal convexity unchanged from 2021 compatible with meningioma. No brain edema. Vascular: Negative for hyperdense vessel Skull: Negative Sinuses/Orbits: Mild mucosal edema paranasal sinuses. Mastoid clear. Negative orbit Other: None CT CERVICAL SPINE FINDINGS Alignment: Mild anterolisthesis C3-4 and C4-5.  Cervical kyphosis. Skull base and vertebrae: Negative for fracture Soft tissues and spinal canal: Atherosclerotic calcification the carotid artery bilaterally. No soft tissue mass. Port-A-Cath on the left. Disc levels: Mild disc  degeneration in the cervical spine. No significant stenosis. Upper chest: Mild apical emphysema.  No acute abnormality Other: None IMPRESSION: 1. No acute intracranial injury. 2. 1 cm meningioma over the right parietal convexity unchanged. 3. Negative for cervical spine fracture. Electronically Signed   By: Franchot Gallo M.D.   On: 09/04/2021 13:53   CT Cervical Spine Wo Contrast  Result Date: 09/04/2021 CLINICAL DATA:  Fall. EXAM: CT HEAD WITHOUT CONTRAST CT CERVICAL SPINE WITHOUT CONTRAST TECHNIQUE: Multidetector CT imaging of the head and cervical spine was performed following the standard  protocol without intravenous contrast. Multiplanar CT image reconstructions of the cervical spine were also generated. RADIATION DOSE REDUCTION: This exam was performed according to the departmental dose-optimization program which includes automated exposure control, adjustment of the mA and/or kV according to patient size and/or use of iterative reconstruction technique. COMPARISON:  CT head 02/22/2020 FINDINGS: CT HEAD FINDINGS Brain: Ventricle size normal. Mild to moderate patchy white matter hypodensity bilaterally appears chronic. Negative for acute infarct or hemorrhage 1 cm round hyperdense extra-axial mass right parietal convexity unchanged from 2021 compatible with meningioma. No brain edema. Vascular: Negative for hyperdense vessel Skull: Negative Sinuses/Orbits: Mild mucosal edema paranasal sinuses. Mastoid clear. Negative orbit Other: None CT CERVICAL SPINE FINDINGS Alignment: Mild anterolisthesis C3-4 and C4-5.  Cervical kyphosis. Skull base and vertebrae: Negative for fracture Soft tissues and spinal canal: Atherosclerotic calcification the carotid artery bilaterally. No soft tissue mass. Port-A-Cath on the left. Disc levels: Mild disc degeneration in the cervical spine. No significant stenosis. Upper chest: Mild apical emphysema.  No acute abnormality Other: None IMPRESSION: 1. No acute intracranial injury. 2. 1 cm meningioma over the right parietal convexity unchanged. 3. Negative for cervical spine fracture. Electronically Signed   By: Franchot Gallo M.D.   On: 09/04/2021 13:53    Microbiology: Results for orders placed or performed during the hospital encounter of 07/16/21  Resp Panel by RT-PCR (Flu A&B, Covid) Nasopharyngeal Swab     Status: None   Collection Time: 07/16/21  7:53 PM   Specimen: Nasopharyngeal Swab; Nasopharyngeal(NP) swabs in vial transport medium  Result Value Ref Range Status   SARS Coronavirus 2 by RT PCR NEGATIVE NEGATIVE Final    Comment: (NOTE) SARS-CoV-2 target  nucleic acids are NOT DETECTED.  The SARS-CoV-2 RNA is generally detectable in upper respiratory specimens during the acute phase of infection. The lowest concentration of SARS-CoV-2 viral copies this assay can detect is 138 copies/mL. A negative result does not preclude SARS-Cov-2 infection and should not be used as the sole basis for treatment or other patient management decisions. A negative result may occur with  improper specimen collection/handling, submission of specimen other than nasopharyngeal swab, presence of viral mutation(s) within the areas targeted by this assay, and inadequate number of viral copies(<138 copies/mL). A negative result must be combined with clinical observations, patient history, and epidemiological information. The expected result is Negative.  Fact Sheet for Patients:  EntrepreneurPulse.com.au  Fact Sheet for Healthcare Providers:  IncredibleEmployment.be  This test is no t yet approved or cleared by the [place] FDA and  has been authorized for detection and/or diagnosis of SARS-CoV-2 by FDA under an Emergency Use Authorization (EUA). This EUA will remain  in effect (meaning this test can be used) for the duration of the COVID-19 declaration under Section 564(b)(1) of the Act, 21 U.S.C.section 360bbb-3(b)(1), unless the authorization is terminated  or revoked sooner.       Influenza A by PCR NEGATIVE NEGATIVE Final  Influenza B by PCR NEGATIVE NEGATIVE Final    Comment: (NOTE) The Xpert Xpress SARS-CoV-2/FLU/RSV plus assay is intended as an aid in the diagnosis of influenza from Nasopharyngeal swab specimens and should not be used as a sole basis for treatment. Nasal washings and aspirates are unacceptable for Xpert Xpress SARS-CoV-2/FLU/RSV testing.  Fact Sheet for Patients: EntrepreneurPulse.com.au  Fact Sheet for Healthcare  Providers: IncredibleEmployment.be  This test is not yet approved or cleared by the [place] FDA and has been authorized for detection and/or diagnosis of SARS-CoV-2 by FDA under an Emergency Use Authorization (EUA). This EUA will remain in effect (meaning this test can be used) for the duration of the COVID-19 declaration under Section 564(b)(1) of the Act, 21 U.S.C. section 360bbb-3(b)(1), unless the authorization is terminated or revoked.  Performed at [facility], [address], [place], [place] [address]   Surgical pcr screen     Status: Abnormal   Collection Time: 07/17/21  4:14 AM   Specimen: Nasal Mucosa; Nasal Swab  Result Value Ref Range Status   MRSA, PCR NEGATIVE NEGATIVE Final   Staphylococcus aureus POSITIVE (A) NEGATIVE Final    Comment: (NOTE) The Xpert SA Assay (FDA approved for NASAL specimens in patients 68 years of age and older), is one component of a comprehensive surveillance program. It is not intended to diagnose infection nor to guide or monitor treatment. Performed at [facility], [place], [place] [address]   Respiratory (~20 pathogens) panel by PCR     Status: None   Collection Time: 07/17/21 10:30 AM   Specimen: Nasopharyngeal Swab; Respiratory  Result Value Ref Range Status   Adenovirus NOT DETECTED NOT DETECTED Final   Coronavirus 229E NOT DETECTED NOT DETECTED Final    Comment: (NOTE) The Coronavirus on the Respiratory Panel, DOES NOT test for the novel  Coronavirus (2019 nCoV)    Coronavirus HKU1 NOT DETECTED NOT DETECTED Final   Coronavirus NL63 NOT DETECTED NOT DETECTED Final   Coronavirus OC43 NOT DETECTED NOT DETECTED Final   Metapneumovirus NOT DETECTED NOT DETECTED Final   Rhinovirus / Enterovirus NOT DETECTED NOT DETECTED Final   Influenza A NOT DETECTED NOT DETECTED Final   Influenza B NOT DETECTED NOT DETECTED Final   Parainfluenza Virus 1 NOT DETECTED NOT DETECTED  Final   Parainfluenza Virus 2 NOT DETECTED NOT DETECTED Final   Parainfluenza Virus 3 NOT DETECTED NOT DETECTED Final   Parainfluenza Virus 4 NOT DETECTED NOT DETECTED Final   Respiratory Syncytial Virus NOT DETECTED NOT DETECTED Final   Bordetella pertussis NOT DETECTED NOT DETECTED Final   Bordetella Parapertussis NOT DETECTED NOT DETECTED Final   Chlamydophila pneumoniae NOT DETECTED NOT DETECTED Final   Mycoplasma pneumoniae NOT DETECTED NOT DETECTED Final    Comment: Performed at [facility], [place]. [address] [place], [place] [address]  Blood culture (routine x 2)     Status: None   Collection Time: 07/18/21  4:05 PM   Specimen: BLOOD  Result Value Ref Range Status   Specimen Description BLOOD BLOOD LEFT HAND  Final   Special Requests   Final    BOTTLES DRAWN AEROBIC AND ANAEROBIC Blood Culture results may not be optimal due to an inadequate volume of blood received in culture bottles   Culture   Final    NO GROWTH 5 DAYS Performed at [facility], [address], [place],  [address]    Report Status 07/23/2021 FINAL  Final  Blood culture (  routine x 2)     Status: None   Collection Time: 07/18/21  4:05 PM   Specimen: BLOOD  Result Value Ref Range Status   Specimen Description BLOOD BLOOD RIGHT HAND  Final   Special Requests   Final    BOTTLES DRAWN AEROBIC AND ANAEROBIC Blood Culture results may not be optimal due to an inadequate volume of blood received in culture bottles   Culture   Final    NO GROWTH 5 DAYS Performed at [facility], [address] [place], [place] [address]    Report Status 07/23/2021 FINAL  Final    Labs: CBC: Recent Labs  Lab 09/04/21 1320 09/04/21 1600 09/05/21 0823 09/05/21 1700 09/06/21 0810 09/07/21 0827  WBC 2.2* 2.8* 3.7*  --  8.9 9.1  NEUTROABS 1.3*  --   --   --   --   --   HGB 8.5* 7.3* 5.5* 10.5* 10.8* 12.0  HCT 25.4* 21.9* 16.5* 31.0* 31.2* 35.5*  MCV 95.5 94.8 96.5  --  86.2 86.6  PLT 5* 6*  23*  --  28* 41*   Basic Metabolic Panel: Recent Labs  Lab 09/04/21 1320 09/04/21 1351 09/04/21 1600 09/05/21 0823 09/05/21 1700 09/06/21 0638 09/07/21 0827 09/08/21 0515  NA 132*  --   --  136  --  136  --   --   K 3.0*  --   --  2.6* 4.3 3.6  --  3.4*  CL 100  --   --  117*  --  110  --   --   CO2 20*  --   --  16*  --  22  --   --   GLUCOSE 111*  --   --  78  --  88  --   --   BUN 22  --   --  8  --  <5*  --   --   CREATININE 0.65  --   --  0.34*  --  0.40*  --   --   CALCIUM 8.6*  --   --  6.4*  --  8.5*  --   --   MG  --  1.4*  --  1.4*  --  1.4* 1.1* 1.8  PHOS  --   --  2.3*  --   --   --   --   --    Liver Function Tests: Recent Labs  Lab 09/04/21 1320  AST 15  ALT 13  ALKPHOS 60  BILITOT 0.7  PROT 6.3*  ALBUMIN 3.2*   CBG: No results for input(s): "GLUCAP" in the last 168 hours.  Discharge time spent: greater than 30 minutes.  Signed: Fritzi Mandes, MD [facility] 09/08/2021

## 2021-09-08 NOTE — Telephone Encounter (Signed)
-----   Message from Earlie Server, MD sent at 09/08/2021  1:01 PM EDT ----- She will be discharged soon.  Please arrange patient to get right diagnostic mammogram/US ASAP. Follow up 7/3 flex lab MD + IVF thanks

## 2021-09-10 ENCOUNTER — Other Ambulatory Visit: Payer: Self-pay | Admitting: *Deleted

## 2021-09-11 ENCOUNTER — Encounter: Payer: Self-pay | Admitting: Oncology

## 2021-09-13 ENCOUNTER — Inpatient Hospital Stay: Payer: Medicaid Other | Attending: Oncology | Admitting: Hospice and Palliative Medicine

## 2021-09-13 DIAGNOSIS — E876 Hypokalemia: Secondary | ICD-10-CM

## 2021-09-13 DIAGNOSIS — Z171 Estrogen receptor negative status [ER-]: Secondary | ICD-10-CM | POA: Insufficient documentation

## 2021-09-13 DIAGNOSIS — G620 Drug-induced polyneuropathy: Secondary | ICD-10-CM

## 2021-09-13 DIAGNOSIS — C50911 Malignant neoplasm of unspecified site of right female breast: Secondary | ICD-10-CM | POA: Insufficient documentation

## 2021-09-13 DIAGNOSIS — T451X5A Adverse effect of antineoplastic and immunosuppressive drugs, initial encounter: Secondary | ICD-10-CM

## 2021-09-13 MED ORDER — POTASSIUM CHLORIDE CRYS ER 20 MEQ PO TBCR: 20.0000 meq | EXTENDED_RELEASE_TABLET | Freq: Three times a day (TID) | ORAL | 0 refills | Status: DC

## 2021-09-13 MED ORDER — POTASSIUM CHLORIDE CRYS ER 20 MEQ PO TBCR
20.0000 meq | EXTENDED_RELEASE_TABLET | Freq: Three times a day (TID) | ORAL | 1 refills | Status: DC
Start: 2021-09-13 — End: 2021-09-13

## 2021-09-13 NOTE — Progress Notes (Signed)
Virtual Visit via Telephone Note  I connected with Yvonne Scott on 09/13/21 at  1:40 PM EDT by telephone and verified that I am speaking with the correct person using two identifiers.  Location: Patient: Home Provider: Clinic   I discussed the limitations, risks, security and privacy concerns of performing an evaluation and management service by telephone and the availability of in person appointments. I also discussed with the patient that there may be a patient responsible charge related to this service. The patient expressed understanding and agreed to proceed.   History of Present Illness: Yvonne Scott is a 61 y.o. female with multiple medical problems including alcohol dependence, tobacco use, and stage IIIb triple negative right breast cancer.  Plan is for neoadjuvant chemotherapy/immunotherapy.  Patient has had breast pain and was referred to palliative care for symptom management.   Observations/Objective: Patient was discharged from the hospital on 09/08/2021 after being hospitalized for pancytopenia with syncope suspected due to electrolyte imbalance.    Patient reports that she is overall feeling better.  She denies significant changes or concerns today.  No symptomatic complaints at present.  Pain is reportedly stable.  Patient did request refill of her potassium and this was sent to her pharmacy.  Discussed follow-up appointment with patient in detail.   Assessment and Plan: Hypokalemia -refill potassium.  Follow-up with labs when patient is seen later this week  Breast pain -continue gabapentin.    Follow Up Instructions: Follow-up telephone visit 1-2 months   I discussed the assessment and treatment plan with the patient. The patient was provided an opportunity to ask questions and all were answered. The patient agreed with the plan and demonstrated an understanding of the instructions.   The patient was advised to call back or seek an in-person evaluation if  the symptoms worsen or if the condition fails to improve as anticipated.  I provided 5 minutes of non-face-to-face time during this encounter.   Irean Hong, NP

## 2021-09-15 ENCOUNTER — Telehealth: Payer: Self-pay

## 2021-09-15 ENCOUNTER — Ambulatory Visit
Admission: RE | Admit: 2021-09-15 | Discharge: 2021-09-15 | Disposition: A | Discharge status: Home / Self Care | Payer: Medicaid Other | Source: Ambulatory Visit | Attending: Oncology | Admitting: Oncology

## 2021-09-15 ENCOUNTER — Encounter: Payer: Self-pay | Admitting: Oncology

## 2021-09-15 ENCOUNTER — Inpatient Hospital Stay: Payer: Medicaid Other

## 2021-09-15 ENCOUNTER — Inpatient Hospital Stay (HOSPITAL_BASED_OUTPATIENT_CLINIC_OR_DEPARTMENT_OTHER): Payer: Medicaid Other | Admitting: Oncology

## 2021-09-15 VITALS — BP 146/96 | HR 101 | Temp 97.4°F | Wt 97.0 lb

## 2021-09-15 DIAGNOSIS — R051 Acute cough: Secondary | ICD-10-CM

## 2021-09-15 DIAGNOSIS — R52 Pain, unspecified: Secondary | ICD-10-CM

## 2021-09-15 DIAGNOSIS — Z171 Estrogen receptor negative status [ER-]: Secondary | ICD-10-CM | POA: Diagnosis not present

## 2021-09-15 DIAGNOSIS — E876 Hypokalemia: Secondary | ICD-10-CM

## 2021-09-15 DIAGNOSIS — G893 Neoplasm related pain (acute) (chronic): Secondary | ICD-10-CM

## 2021-09-15 DIAGNOSIS — C50911 Malignant neoplasm of unspecified site of right female breast: Secondary | ICD-10-CM

## 2021-09-15 DIAGNOSIS — Z720 Tobacco use: Secondary | ICD-10-CM

## 2021-09-15 LAB — CBC WITH DIFFERENTIAL/PLATELET
Abs Immature Granulocytes: 0.56 10*3/uL — ABNORMAL HIGH (ref 0.00–0.07)
Basophils Absolute: 0.1 10*3/uL (ref 0.0–0.1)
Basophils Relative: 1 %
Eosinophils Absolute: 0 10*3/uL (ref 0.0–0.5)
Eosinophils Relative: 0 %
HCT: 33.7 % — ABNORMAL LOW (ref 36.0–46.0)
Hemoglobin: 11.2 g/dL — ABNORMAL LOW (ref 12.0–15.0)
Immature Granulocytes: 5 %
Lymphocytes Relative: 21 %
Lymphs Abs: 2.6 10*3/uL (ref 0.7–4.0)
MCH: 30.6 pg (ref 26.0–34.0)
MCHC: 33.2 g/dL (ref 30.0–36.0)
MCV: 92.1 fL (ref 80.0–100.0)
Monocytes Absolute: 1.4 10*3/uL — ABNORMAL HIGH (ref 0.1–1.0)
Monocytes Relative: 11 %
Neutro Abs: 7.8 10*3/uL — ABNORMAL HIGH (ref 1.7–7.7)
Neutrophils Relative %: 62 %
Platelets: 313 10*3/uL (ref 150–400)
RBC: 3.66 MIL/uL — ABNORMAL LOW (ref 3.87–5.11)
RDW: 17.6 % — ABNORMAL HIGH (ref 11.5–15.5)
WBC: 12.4 10*3/uL — ABNORMAL HIGH (ref 4.0–10.5)
nRBC: 0 % (ref 0.0–0.2)

## 2021-09-15 LAB — COMPREHENSIVE METABOLIC PANEL
ALT: 12 U/L (ref 0–44)
AST: 19 U/L (ref 15–41)
Albumin: 3.5 g/dL (ref 3.5–5.0)
Alkaline Phosphatase: 65 U/L (ref 38–126)
Anion gap: 9 (ref 5–15)
BUN: 8 mg/dL (ref 8–23)
CO2: 27 mmol/L (ref 22–32)
Calcium: 9.2 mg/dL (ref 8.9–10.3)
Chloride: 101 mmol/L (ref 98–111)
Creatinine, Ser: 0.79 mg/dL (ref 0.44–1.00)
GFR, Estimated: >60 mL/min (ref >60)
Glucose, Bld: 82 mg/dL (ref 70–99)
Potassium: 3.6 mmol/L (ref 3.5–5.1)
Sodium: 137 mmol/L (ref 135–145)
Total Bilirubin: 0.5 mg/dL (ref 0.3–1.2)
Total Protein: 6.4 g/dL — ABNORMAL LOW (ref 6.5–8.1)

## 2021-09-15 LAB — MAGNESIUM: Magnesium: 1.5 mg/dL — ABNORMAL LOW (ref 1.7–2.4)

## 2021-09-15 LAB — T4, FREE: Free T4: 0.98 ng/dL (ref 0.61–1.12)

## 2021-09-15 LAB — TSH: TSH: 1.594 u[IU]/mL (ref 0.350–4.500)

## 2021-09-15 MED ORDER — SODIUM CHLORIDE 0.9% FLUSH
10.0000 mL | INTRAVENOUS | Status: DC | PRN
  Administered 2021-09-15: 10 mL via INTRAVENOUS
  Filled 2021-09-15: qty 10

## 2021-09-15 MED ORDER — POTASSIUM CHLORIDE CRYS ER 20 MEQ PO TBCR: 20.0000 meq | EXTENDED_RELEASE_TABLET | Freq: Every day | ORAL | 0 refills | Status: DC

## 2021-09-15 MED ORDER — MAGNESIUM SULFATE 2 GM/50ML IV SOLN
2.0000 g | Freq: Once | INTRAVENOUS | Status: AC
  Administered 2021-09-15: 2 g via INTRAVENOUS
  Filled 2021-09-15: qty 50

## 2021-09-15 MED ORDER — SODIUM CHLORIDE 0.9 % IV SOLN
Freq: Once | INTRAVENOUS | Status: AC
  Filled 2021-09-15: qty 250

## 2021-09-15 MED ORDER — HEPARIN SOD (PORK) LOCK FLUSH 100 UNIT/ML IV SOLN
500.0000 [IU] | Freq: Once | INTRAVENOUS | Status: AC
  Administered 2021-09-15: 500 [IU] via INTRAVENOUS
  Filled 2021-09-15: qty 5

## 2021-09-15 MED FILL — Fosaprepitant Dimeglumine For IV Infusion 150 MG (Base Eq): INTRAVENOUS | Qty: 5 | Status: AC

## 2021-09-15 MED FILL — Dexamethasone Sodium Phosphate Inj 100 MG/10ML: INTRAMUSCULAR | Qty: 1 | Status: AC

## 2021-09-15 NOTE — Patient Instructions (Signed)
[facility] AT [place]  Discharge Instructions: Thank you for choosing [facility] to provide your oncology and hematology care.  If you have a lab appointment with the [facility], please go directly to the [facility] and check in at the registration area.  Wear comfortable clothing and clothing appropriate for easy access to any Portacath or PICC line.   We strive to give you quality time with your provider. You may need to reschedule your appointment if you arrive late (15 or more minutes).  Arriving late affects you and other patients whose appointments are after yours.  Also, if you miss three or more appointments without notifying the office, you may be dismissed from the clinic at the provider's discretion.      For prescription refill requests, have your pharmacy contact our office and allow 72 hours for refills to be completed.    Today you received Magnesium   To help prevent nausea and vomiting after your treatment, we encourage you to take your nausea medication as directed.  BELOW ARE SYMPTOMS THAT SHOULD BE REPORTED IMMEDIATELY: *FEVER GREATER THAN 100.4 F (38 C) OR HIGHER *CHILLS OR SWEATING *NAUSEA AND VOMITING THAT IS NOT CONTROLLED WITH YOUR NAUSEA MEDICATION *UNUSUAL SHORTNESS OF BREATH *UNUSUAL BRUISING OR BLEEDING *URINARY PROBLEMS (pain or burning when urinating, or frequent urination) *BOWEL PROBLEMS (unusual diarrhea, constipation, pain near the anus) TENDERNESS IN MOUTH AND THROAT WITH OR WITHOUT PRESENCE OF ULCERS (sore throat, sores in mouth, or a toothache) UNUSUAL RASH, SWELLING OR PAIN  UNUSUAL VAGINAL DISCHARGE OR ITCHING   Items with * indicate a potential emergency and should be followed up as soon as possible or go to the Emergency Department if any problems should occur.  Please show the CHEMOTHERAPY ALERT CARD or IMMUNOTHERAPY ALERT CARD at check-in to the Emergency Department and triage nurse.  Should you  have questions after your visit or need to cancel or reschedule your appointment, please contact [facility] [place] AT [place]  [phone] and follow the prompts.  Office hours are 8:00 a.m. to 4:30 p.m. Monday - Friday. Please note that voicemails left after 4:00 p.m. may not be returned until the following business day.  We are closed weekends and major holidays. You have access to a nurse at all times for urgent questions. Please call the main number to the clinic [phone] and follow the prompts.  For any non-urgent questions, you may also contact your provider using MyChart. We now offer e-Visits for anyone 23 and older to request care online for non-urgent symptoms. For details visit [email or web address].   Also download the MyChart app! Go to the app store, search "MyChart", open the app, select [facility], and log in with your MyChart username and password.  Masks are optional in the cancer centers. If you would like for your care team to wear a mask while they are taking care of you, please let them know. For doctor visits, patients may have with them one support person who is at least 62 years old. At this time, visitors are not allowed in the infusion area.

## 2021-09-15 NOTE — Assessment & Plan Note (Signed)
Smoke cessation was discussed with patient. 

## 2021-09-15 NOTE — Assessment & Plan Note (Signed)
Recommend patient to continue magnesium supplementation Patient will get IV magnesium 2 g today.

## 2021-09-15 NOTE — Telephone Encounter (Signed)
Rx for potassium 20 mEq daily has been sent to [facility]. Pt informed.

## 2021-09-15 NOTE — Telephone Encounter (Signed)
There is a message on the voicemail from Monday and I see she actually has an appointment today with Dr Tasia Catchings but I just wanted to let you all know just in case.. patient stating that rx for potassium is not at [facility] on [address]. She would like to know if she is still a patient of Dr Collie Siad. Callback # is [phone]

## 2021-09-15 NOTE — Assessment & Plan Note (Signed)
Change potassium prescription to 20 mEq daily.

## 2021-09-15 NOTE — Progress Notes (Signed)
Patient tolerated 2g Mg infusion well. Patient stable at discharge. Patient aware of [facility]. AVS given.

## 2021-09-15 NOTE — Assessment & Plan Note (Signed)
Clinical stage III triple negative right breast cancer. cT3 cN0-1 Patient has been on neoadjuvant carboplatin Taxol Keytruda followed by [facility] with Keytruda.  She tolerates with mild to severe difficulties.  She has 1 more cycles of AC plus Keytruda which I am going to hold off due to multiple hospitalizations secondary to cytopenia/electrolyte deficiency. I recommend patient to proceed with surgery She has mammogram scheduled and will need to follow-up with Dr. Peyton Najjar for evaluation of surgery.  Previously discussed with Dr. Peyton Najjar.

## 2021-09-15 NOTE — Progress Notes (Signed)
Hematology/Oncology Progress note [phone] [phone]         Patient Care Team: [name], NP as PCP - General (Nurse Practitioner)  REFERRING PROVIDER: [name], NP   ASSESSMENT & PLAN:  Invasive ductal carcinoma of right breast ([place]) Clinical stage III triple negative right breast cancer. cT3 cN0-1 Patient has been on neoadjuvant carboplatin Taxol Keytruda followed by [facility] with Keytruda.  She tolerates with mild to severe difficulties.  She has 1 more cycles of AC plus Keytruda which I am going to hold off due to multiple hospitalizations secondary to cytopenia/electrolyte deficiency. I recommend patient to proceed with surgery She has mammogram scheduled and will need to follow-up with Dr. Peyton Najjar for evaluation of surgery.  Previously discussed with Dr. Peyton Najjar.   Hypokalemia Change potassium prescription to 20 mEq daily.  Hypomagnesemia Recommend patient to continue magnesium supplementation Patient will get IV magnesium 2 g today.  Tobacco abuse Smoke cessation was discussed with patient.  Congestion and cough, I will obtain x-ray. Orders Placed This Encounter  Procedures   DG Chest 2 View    Standing Status:   Future    Number of Occurrences:   1    Standing Expiration Date:   09/15/2022    Order Specific Question:   Reason for Exam (SYMPTOM  OR DIAGNOSIS REQUIRED)    Answer:   cough, congested    Order Specific Question:   Preferred imaging location?    Answer:   [facility]   TBD, plan to see patient after surgery and discussed about adjuvant treatment plan.  All questions were answered. The patient knows to call the clinic with any problems, questions or concerns.  Earlie Server, MD, PhD [facility] Hematology Oncology 09/15/2021      CHIEF COMPLAINTS/REASON FOR VISIT:  Follow-up for triple negative right breast cancer.  HISTORY OF PRESENTING ILLNESS:   Yvonne Scott is a  61 y.o.  female with PMH listed  below was seen in consultation at the request of  [name], NP  for evaluation of breast cancer.  August 2022, patient was diagnosed with right breast stage IIIb cT3 N0M0, grade 2, ER negative, PR negative HER2 negative breast cancer.  Patient self palpated breast mass many years ago.  10/21/2020 diagnostic mammogram and ultrasound confirmed presence of mass and a subsequent biopsy revealed right breast triple negative cancer. There was plan for patient to start with neoadjuvant chemotherapy followed by surgery and radiation.  Unfortunately patient never started treatment.  Patient was accompanied by her mother.  They want to transfer her oncology care to locally.  Patient reports having right breast pain as well as generalized pain.  Patient had a [name] placed 3 weeks ago and she reports some swelling around the sites.  Patient has had negative genetic testing at [facility].  Patient has a history of alcohol dependence.  Per patient she has not been drinking recently.  Everyday smoker.   02/24/2021, CT chest abdomen pelvis showed large right breast mass, 5.5 x 4.8 cm with small right axillary lymph node with variable degrees of enhancement potentially involved but not specific based on size.  This tracked down to retropectoral nodal stations largest lymph node along the lateral margin of the pectoralis major.  Tiny pulmonary nodules in the chest as discussed.  These are nonspecific but would consider short interval follow-up to assess for any changes.  No evidence of metastatic disease involving the abdomen or pelvis.  Aortic atherosclerosis.  02/25/2021,  echocardiogram showed LVEF 65 to 70%.  Patient has been to chemotherapy class and understands antiemetics instructions.  03/16/2021, bone scan showed no evidence of osseous metastatic breast cancer, asymmetric osseous uptake on the right at L5-S1, corresponding to degenerative disc disease on recent CT.  Asymmetric soft tissue uptake in the right  breast.  07/05/2021, right diagnostic mammogram and ultrasound showed interval decrease of the size of the right breast mass, currently 4.6 x 3.6 x 2.6, previously 5.1 x 4.4 x 3.5.  No suspicious right axillary lymphadenopathy on examination. 07/16/2021-07/19/2021, patient was admitted for HCAP patient was treated with antibiotics.  INTERVAL HISTORY [name] is a 61 y.o. female who has above history reviewed by me today presents for follow up visit for management of triple negative breast cancer, stage IIIB 09/04/2021 - 09/08/2021 Patient was admitted due to pancytopenia, thrush electrolyte abnormality.   Today she reports feeling better.  Her mother was recently admitted for pneumonia.  She feels that she is a little bit congested today.  Some cough, mostly nonproductive but today she starts coughing up a little yellowish sputum.  No fever or chills, shortness of breath, chest pain,    Review of Systems  Constitutional:  Positive for fatigue. Negative for appetite change, chills and fever.  HENT:   Negative for hearing loss, mouth sores and voice change.   Eyes:  Negative for eye problems.  Respiratory:  Negative for chest tightness and cough.   Cardiovascular:  Negative for chest pain.  Gastrointestinal:  Negative for abdominal distention, abdominal pain, blood in stool, diarrhea and nausea.  Endocrine: Negative for hot flashes.  Genitourinary:  Negative for difficulty urinating, dysuria and frequency.   Musculoskeletal:  Negative for arthralgias.  Skin:  Negative for itching and rash.  Neurological:  Positive for numbness. Negative for extremity weakness.  Hematological:  Negative for adenopathy.  Psychiatric/Behavioral:  Negative for confusion. The patient is not nervous/anxious.     MEDICAL HISTORY:  Past Medical History:  Diagnosis Date   Anxiety    Aortic atherosclerosis ([place]) 10/24/2016   Breast cancer (H[facility])    GERD (gastroesophageal reflux disease)    Personal history of  chemotherapy     SURGICAL HISTORY: Past Surgical History:  Procedure Laterality Date   BREAST BIOPSY      SOCIAL HISTORY: Social History   Socioeconomic History   Marital status: Widowed    Spouse name: Not on file   Number of children: Not on file   Years of education: Not on file   Highest education level: Not on file  Occupational History   Not on file  Tobacco Use   Smoking status: Every Day    Packs/day: 1.00    Years: 36.00    Total pack years: 36.00    Types: Cigarettes    Start date: 03/15/1979   Smokeless tobacco: Never  Vaping Use   Vaping Use: Never used  Substance and Sexual Activity   Alcohol use: No    Alcohol/week: 3.0 standard drinks of alcohol    Types: 3 Standard drinks or equivalent per week   Drug use: Not Currently   Sexual activity: Not Currently    Birth control/protection: None  Other Topics Concern   Not on file  Social History Narrative   ** Merged History Encounter **       Social Determinants of Health   Financial Resource Strain: Not on file  Food Insecurity: Not on file  Transportation Needs: Not on file  Physical Activity:  Not on file  Stress: Not on file  Social Connections: Not on file  Intimate Partner Violence: Not on file    FAMILY HISTORY: Family History  Problem Relation Age of Onset   Cancer Mother 25       breast   Cancer Father        melonma   Cirrhosis Maternal Grandfather    Cancer Paternal Grandmother        skin/breast    ALLERGIES:  has No Known Allergies.  MEDICATIONS:  Current Outpatient Medications  Medication Sig Dispense Refill   calcium carbonate (TUMS EX) 750 MG chewable tablet Chew 2 tablets by mouth 4 (four) times daily as needed for heartburn.     calcium-vitamin D (OSCAL WITH D) 500-5 MG-MCG tablet Take 2 tablets by mouth daily. 60 tablet 5   dexamethasone (DECADRON) 4 MG tablet TAKE 2 TABLETS BY MOUTH ONCE DAILY FOR 2 DAYS AFTER  CHEMOTHERAPY.  TAKE  WITH  FOOD. 15 tablet 0   feeding  supplement (ENSURE ENLIVE / ENSURE PLUS) LIQD Take 237 mLs by mouth 3 (three) times daily between meals. 237 mL 12   HYDROcodone-acetaminophen (NORCO) 5-325 MG tablet Take 1 tablet by mouth every 12 (twelve) hours as needed for moderate pain. 10 tablet 0   ibuprofen (ADVIL) 200 MG tablet Take 200-400 mg by mouth every 6 (six) hours as needed for headache.     lidocaine-prilocaine (EMLA) cream Apply to affected area once 30 g 3   LORazepam (ATIVAN) 0.5 MG tablet Take 1 tablet (0.5 mg total) by mouth every 12 (twelve) hours as needed for anxiety or sleep (or nausea). 60 tablet 0   magic mouthwash (multi-ingredient) oral suspension Take 5 mLs by mouth 4 (four) times daily as needed for mouth pain. 480 mL 1   magnesium chloride (SLOW-MAG) 64 MG TBEC SR tablet Take 1 tablet (64 mg total) by mouth 2 (two) times daily. 60 tablet 2   Multiple Vitamin (MULTIVITAMIN WITH MINERALS) TABS tablet Take 1 tablet by mouth daily. 30 tablet 0   neomycin-bacitracin-polymyxin (NEOSPORIN) OINT Apply 1 Application topically 2 (two) times daily. 1 g 0   nicotine (NICODERM CQ - DOSED IN MG/24 HR) 7 mg/24hr patch Place 1 patch (7 mg total) onto the skin daily. 28 patch 0   nystatin (MYCOSTATIN) 100000 UNIT/ML suspension Take 5 mLs (500,000 Units total) by mouth 4 (four) times daily. 60 mL 0   ondansetron (ZOFRAN) 8 MG tablet TAKE 1 TABLET BY MOUTH TWICE DAILY AS NEEDED -  START  THE  3RD  DAY  AFTER  CARBOPLATIN  AND  AC  CHEMOTHERAPY 90 tablet 0   pantoprazole (PROTONIX) 40 MG tablet Take 1 tablet (40 mg total) by mouth daily. 30 tablet 0   sucralfate (CARAFATE) 1 g tablet Take 1 tablet (1 g total) by mouth 4 (four) times daily -  with meals and at bedtime. 120 tablet 0   VENTOLIN HFA 108 (90 Base) MCG/ACT inhaler Inhale 1-2 puffs into the lungs every 6 (six) hours as needed for wheezing or shortness of breath.     gabapentin (NEURONTIN) 100 MG capsule Take 1 capsule (100 mg total) by mouth 3 (three) times daily. (Patient not  taking: Reported on 08/18/2021) 90 capsule 0   potassium chloride SA (KLOR-CON M) 20 MEQ tablet Take 1 tablet (20 mEq total) by mouth daily. 90 tablet 0   No current facility-administered medications for this visit.   Facility-Administered Medications Ordered in Other Visits  Medication  Dose Route Frequency Provider Last Rate Last Admin   famotidine (PEPCID) 20-0.9 MG/50ML-% IVPB            sodium chloride flush (NS) 0.9 % injection 10 mL  10 mL Intravenous PRN Earlie Server, MD   10 mL at 09/15/21 0843     PHYSICAL EXAMINATION: ECOG PERFORMANCE STATUS: 1 - Symptomatic but completely ambulatory Vitals:   09/15/21 0852  BP: (!) 146/96  Pulse: (!) 101  Temp: (!) 97.4 F (36.3 C)   Filed Weights   09/15/21 0852  Weight: 97 lb (44 kg)    Physical Exam Constitutional:      General: She is not in acute distress. HENT:     Head: Normocephalic and atraumatic.  Eyes:     General: No scleral icterus. Cardiovascular:     Rate and Rhythm: Normal rate and regular rhythm.     Heart sounds: Normal heart sounds.  Pulmonary:     Effort: Pulmonary effort is normal. No respiratory distress.     Breath sounds: No wheezing.  Abdominal:     General: Bowel sounds are normal. There is no distension.     Palpations: Abdomen is soft.  Musculoskeletal:        General: No deformity. Normal range of motion.     Cervical back: Normal range of motion and neck supple.  Skin:    General: Skin is warm and dry.     Findings: No erythema or rash.  Neurological:     Mental Status: She is alert and oriented to person, place, and time. Mental status is at baseline.     Cranial Nerves: No cranial nerve deficit.     Coordination: Coordination normal.  Psychiatric:        Mood and Affect: Mood normal.      LABORATORY DATA:  I have reviewed the data as listed Lab Results  Component Value Date   WBC 12.4 (H) 09/15/2021   HGB 11.2 (L) 09/15/2021   HCT 33.7 (L) 09/15/2021   MCV 92.1 09/15/2021   PLT 313  09/15/2021   Recent Labs    08/31/21 0853 09/04/21 1320 09/05/21 0823 09/05/21 1700 09/06/21 0638 09/08/21 0515 09/15/21 0843  NA 134* 132* 136  --  136  --  137  K 3.4* 3.0* 2.6*   < > 3.6 3.4* 3.6  CL 102 100 117*  --  110  --  101  CO2 24 20* 16*  --  22  --  27  GLUCOSE 81 111* 78  --  88  --  82  BUN 15 22 8   --  <5*  --  8  CREATININE 0.63 0.65 0.34*  --  0.40*  --  0.79  CALCIUM 8.8* 8.6* 6.4*  --  8.5*  --  9.2  GFRNONAA >60 >60 >60  --  >60  --  >60  PROT 6.3* 6.3*  --   --   --   --  6.4*  ALBUMIN 3.5 3.2*  --   --   --   --  3.5  AST 15 15  --   --   --   --  19  ALT 13 13  --   --   --   --  12  ALKPHOS 55 60  --   --   --   --  65  BILITOT 0.3 0.7  --   --   --   --  0.5   < > =  values in this interval not displayed.    Iron/TIBC/Ferritin/ %Sat    Component Value Date/Time   IRON 40 08/18/2021 1134   TIBC 234 (L) 08/18/2021 1134   FERRITIN 202 08/18/2021 1134   IRONPCTSAT 17 08/18/2021 1134   IRONPCTSAT 28 10/24/2016 1534       RADIOGRAPHIC STUDIES: I have personally reviewed the radiological images as listed and agreed with the findings in the report. CT HEAD WO CONTRAST (5MM)  Result Date: 09/04/2021 CLINICAL DATA:  Fall. EXAM: CT HEAD WITHOUT CONTRAST CT CERVICAL SPINE WITHOUT CONTRAST TECHNIQUE: Multidetector CT imaging of the head and cervical spine was performed following the standard protocol without intravenous contrast. Multiplanar CT image reconstructions of the cervical spine were also generated. RADIATION DOSE REDUCTION: This exam was performed according to the departmental dose-optimization program which includes automated exposure control, adjustment of the mA and/or kV according to patient size and/or use of iterative reconstruction technique. COMPARISON:  CT head 02/22/2020 FINDINGS: CT HEAD FINDINGS Brain: Ventricle size normal. Mild to moderate patchy white matter hypodensity bilaterally appears chronic. Negative for acute infarct or  hemorrhage 1 cm round hyperdense extra-axial mass right parietal convexity unchanged from 2021 compatible with meningioma. No brain edema. Vascular: Negative for hyperdense vessel Skull: Negative Sinuses/Orbits: Mild mucosal edema paranasal sinuses. Mastoid clear. Negative orbit Other: None CT CERVICAL SPINE FINDINGS Alignment: Mild anterolisthesis C3-4 and C4-5.  Cervical kyphosis. Skull base and vertebrae: Negative for fracture Soft tissues and spinal canal: Atherosclerotic calcification the carotid artery bilaterally. No soft tissue mass. Port-A-Cath on the left. Disc levels: Mild disc degeneration in the cervical spine. No significant stenosis. Upper chest: Mild apical emphysema.  No acute abnormality Other: None IMPRESSION: 1. No acute intracranial injury. 2. 1 cm meningioma over the right parietal convexity unchanged. 3. Negative for cervical spine fracture. Electronically Signed   By: Franchot Gallo M.D.   On: 09/04/2021 13:53   CT Cervical Spine Wo Contrast  Result Date: 09/04/2021 CLINICAL DATA:  [place]. EXAM: CT HEAD WITHOUT CONTRAST CT CERVICAL SPINE WITHOUT CONTRAST TECHNIQUE: Multidetector CT imaging of the head and cervical spine was performed following the standard protocol without intravenous contrast. Multiplanar CT image reconstructions of the cervical spine were also generated. RADIATION DOSE REDUCTION: This exam was performed according to the departmental dose-optimization program which includes automated exposure control, adjustment of the mA and/or kV according to patient size and/or use of iterative reconstruction technique. COMPARISON:  CT head 02/22/2020 FINDINGS: CT HEAD FINDINGS Brain: Ventricle size normal. Mild to moderate patchy white matter hypodensity bilaterally appears chronic. Negative for acute infarct or hemorrhage 1 cm round hyperdense extra-axial mass right parietal convexity unchanged from 2021 compatible with meningioma. No brain edema. Vascular: Negative for hyperdense  vessel Skull: Negative Sinuses/Orbits: Mild mucosal edema paranasal sinuses. Mastoid clear. Negative orbit Other: None CT CERVICAL SPINE FINDINGS Alignment: Mild anterolisthesis C3-4 and C4-5.  Cervical kyphosis. Skull base and vertebrae: Negative for fracture Soft tissues and spinal canal: Atherosclerotic calcification the carotid artery bilaterally. No soft tissue mass. Port-A-Cath on the left. Disc levels: Mild disc degeneration in the cervical spine. No significant stenosis. Upper chest: Mild apical emphysema.  No acute abnormality Other: None IMPRESSION: 1. No acute intracranial injury. 2. 1 cm meningioma over the right parietal convexity unchanged. 3. Negative for cervical spine fracture. Electronically Signed   By: Franchot Gallo M.D.   On: 09/04/2021 13:53

## 2021-09-15 NOTE — Telephone Encounter (Signed)
She was seen in office

## 2021-09-17 ENCOUNTER — Inpatient Hospital Stay: Payer: Medicaid Other

## 2021-09-22 ENCOUNTER — Telehealth: Payer: Medicaid Other | Admitting: Hospice and Palliative Medicine

## 2021-09-24 ENCOUNTER — Other Ambulatory Visit: Payer: Self-pay | Admitting: Nurse Practitioner

## 2021-09-24 ENCOUNTER — Ambulatory Visit
Admission: RE | Admit: 2021-09-24 | Discharge: 2021-09-24 | Disposition: A | Discharge status: Home / Self Care | Payer: Medicaid Other | Source: Home / Self Care | Attending: Oncology | Admitting: Oncology

## 2021-09-24 ENCOUNTER — Ambulatory Visit
Admission: RE | Admit: 2021-09-24 | Discharge: 2021-09-24 | Disposition: A | Discharge status: Home / Self Care | Payer: Medicaid Other | Source: Ambulatory Visit | Attending: Oncology | Admitting: Oncology

## 2021-09-24 ENCOUNTER — Ambulatory Visit
Admission: RE | Admit: 2021-09-24 | Discharge: 2021-09-24 | Disposition: A | Discharge status: Home / Self Care | Payer: Medicaid Other | Source: Ambulatory Visit | Attending: Nurse Practitioner | Admitting: Nurse Practitioner

## 2021-09-24 ENCOUNTER — Ambulatory Visit: Admission: RE | Admit: 2021-09-24 | Payer: Medicaid Other | Source: Home / Self Care

## 2021-09-24 DIAGNOSIS — R0600 Dyspnea, unspecified: Secondary | ICD-10-CM

## 2021-09-24 DIAGNOSIS — C50911 Malignant neoplasm of unspecified site of right female breast: Secondary | ICD-10-CM | POA: Insufficient documentation

## 2021-10-01 ENCOUNTER — Telehealth: Payer: Self-pay

## 2021-10-01 NOTE — Telephone Encounter (Signed)
Unable to reach pt to inform of results. Attempt by phone made x2. She is scheduled to see Dr. Peyton Najjar on 7/25, will watch for surgery date to schedule MD follow up.

## 2021-10-01 NOTE — Telephone Encounter (Signed)
-----   Message from Earlie Server, MD sent at 09/30/2021 10:58 PM EDT ----- Yvonne Scott,  Her mammogram showed partial response. Next step is surgical evaluation. She missed her appointment with Dr.Cintron today. Please coordinate her rescheduling. I plan to see her 2 weeks after surgery to go over pathology.  Thanks.

## 2021-10-05 ENCOUNTER — Other Ambulatory Visit: Payer: Self-pay | Admitting: General Surgery

## 2021-10-05 ENCOUNTER — Ambulatory Visit: Payer: Self-pay | Admitting: General Surgery

## 2021-10-05 ENCOUNTER — Other Ambulatory Visit: Payer: Self-pay | Admitting: Pediatrics

## 2021-10-05 ENCOUNTER — Encounter: Payer: Self-pay | Admitting: Oncology

## 2021-10-05 DIAGNOSIS — R928 Other abnormal and inconclusive findings on diagnostic imaging of breast: Secondary | ICD-10-CM

## 2021-10-05 DIAGNOSIS — Z171 Estrogen receptor negative status [ER-]: Secondary | ICD-10-CM

## 2021-10-05 NOTE — H&P (View-Only) (Signed)
HISTORY OF PRESENT ILLNESS:    Yvonne Scott is a 61 y.o.female patient who comes for surgical evaluation of right breast cancer.  [name] initially presented feeling a knot in the right breast since 2004.  As per patient she had a mammogram at that time that did not shows any masses.  She endorses that this mass has been growing since 2004.  She had a mammogram and ultrasound done recently in October 2022.  She was found with triple negative right breast cancer.  She was found with a 5 cm mass of the right breast.  There was no abnormal lymph node identified on ultrasound.  I personally evaluated the images.   Patient was never able to start chemotherapy during this month.  She wanted to transfer her care from [facility] to [facility].  She was evaluated by medical oncology.  Her right breast cancer is invasive mammary carcinoma, triple negative.  Recommendation was to start with neoadjuvant chemotherapy.  She started neoadjuvant chemotherapy guided by Dr. Tasia Catchings.  She has been having a lot of difficulty with the chemotherapy with significant weakness and infections.  It we decided to hold neoadjuvant chemotherapy and proceed with surgical management.  Patient had a follow-up ultrasound that shows some reduction of the mass now measuring 4 cm.  Abnormal lymph node not seen on this ultrasound.      PAST MEDICAL HISTORY:  Past Medical History:  Diagnosis Date   Breast cancer (CMS-HCC) 02/2021        PAST SURGICAL HISTORY:   History reviewed. No pertinent surgical history.       MEDICATIONS:  Outpatient Encounter Medications as of 10/05/2021  Medication Sig Dispense Refill   acetaminophen (TYLENOL) 500 MG tablet Take 2 tablets (1,000 mg total) by mouth as needed     albuterol 90 mcg/actuation inhaler as needed     calcium carbonate (TUMS E-X) 300 mg (750 mg) chewable tablet Take by mouth as needed     calcium carbonate-vitamin D3 (OS-CAL 500+D) 500 mg-5 mcg (200 unit) tablet Take 2 tablets by mouth 2  (two) times daily with meals     ibuprofen (MOTRIN) 200 MG tablet Take 1 tablet (200 mg total) by mouth every 6 (six) hours as needed for Headache     lidocaine-prilocaine (EMLA) cream as needed     magnesium chloride (SLOW-MAG) 71.5 mg DR tablet Take by mouth 2 (two) times daily     omeprazole (PRILOSEC) 20 MG DR capsule Take 20 mg by mouth once daily     potassium chloride (KLOR-CON) 20 MEQ ER tablet Take 20 mEq by mouth 2 (two) times daily     sucralfate (CARAFATE) 1 gram tablet Take by mouth 4 (four) times daily     dexAMETHasone (DECADRON) 4 MG tablet as directed (Patient not taking: Reported on 56/81/2751)     folic acid (FOLVITE) 1 MG tablet Take 1 mg by mouth once daily (Patient not taking: Reported on 03/02/2021)     gabapentin (NEURONTIN) 100 MG capsule Take 1 capsule (100 mg total) by mouth 3 (three) times daily (Patient not taking: Reported on 03/02/2021)     multivitamin with minerals tablet Take 1 tablet by mouth once daily (Patient not taking: Reported on 03/02/2021)     ondansetron (ZOFRAN) 8 MG tablet Take by mouth as needed (Patient not taking: Reported on 10/05/2021)     prochlorperazine (COMPAZINE) 10 MG tablet Take by mouth as needed (Patient not taking: Reported on 10/05/2021)     thiamine (VITAMIN  B-1) 100 MG tablet Take 0.5 tablets by mouth once daily (Patient not taking: Reported on 03/02/2021)     No facility-administered encounter medications on file as of 10/05/2021.     ALLERGIES:   Patient has no known allergies.   SOCIAL HISTORY:  Social History   Socioeconomic History   Marital status: Widowed  Tobacco Use   Smoking status: Every Day    Packs/day: 1.00    Types: Cigarettes   Smokeless tobacco: Never  Vaping Use   Vaping Use: Never used  Substance and Sexual Activity   Alcohol use: Yes    Comment: occasional   Drug use: Never    FAMILY HISTORY:  Family History  Problem Relation Age of Onset   Skin cancer Father    High blood pressure  (Hypertension) Sister    Diabetes Sister    No Known Problems Brother    High blood pressure (Hypertension) Maternal Grandmother    Anemia Maternal Grandmother      GENERAL REVIEW OF SYSTEMS:   General ROS: negative for - chills, fever, weight gain or weight loss.  Positive for fatigue Allergy and Immunology ROS: negative for - hives  Hematological and Lymphatic ROS: negative for - bleeding problems or bruising, negative for palpable nodes Endocrine ROS: negative for - heat or cold intolerance, hair changes Respiratory ROS: negative for - cough, shortness of breath or wheezing Cardiovascular ROS: no chest pain or palpitations GI ROS: negative for nausea, vomiting, abdominal pain, diarrhea, constipation Musculoskeletal ROS: Positive for - joint swelling or muscle pain Neurological ROS: negative for - confusion, syncope Dermatological ROS: negative for pruritus and rash  PHYSICAL EXAM:  Vitals:   10/05/21 1015  BP: (!) 140/82  Pulse: 85  .  Ht:160 cm ('5\' 3"'$ ) Wt:49 kg (108 lb) BZJ:IRCV surface area is 1.48 meters squared. Body mass index is 19.13 kg/m.Marland Kitchen   GENERAL: Alert, active, oriented x3  HEENT: Pupils equal reactive to light. Extraocular movements are intact. Sclera clear. Palpebral conjunctiva normal red color.Pharynx clear.  NECK: Supple with no palpable mass and no adenopathy.  LUNGS: Sound clear with no rales rhonchi or wheezes.  HEART: Regular rhythm S1 and S2 without murmur.  BREAST: Right breast large palpable mass.  No clinical axillary adenopathy.  No mass or axillary adenopathy identified on the left breast.  EXTREMITIES: Well-developed well-nourished symmetrical with no dependent edema.  NEUROLOGICAL: Awake alert oriented, facial expression symmetrical, moving all extremities.      IMPRESSION:     Malignant neoplasm of upper-outer quadrant of right breast in female, estrogen receptor negative (CMS-HCC) [C50.411, Z17.1]  Patient oriented about the  alternative of partial versus total mastectomy.  Due to the size of the mass I would recommend to proceed with partial mastectomy.  Patient oriented about the procedure.  Patient ended up with a receptive procedure.  Patient also oriented about the possibility of proceeding with sentinel node biopsy versus axillary node dissection converting the surgery to a modified radical mastectomy.  If sentinel nodes negative lymph node section will avoid axillary node dissection.  If sentinel lymph nodes positive in the frozen section, we will proceed with modified radical mastectomy.  Patient oriented about the risk of surgery includes pain, bleeding, infection, need of a drain, opening of the wound, needing surgical interventions to repair any of the previous complications, lymphedema of the upper extremity, among others.  The patient reports she understood the risks and agreed to proceed with plan.  PLAN:  1.  Right breast total mastectomy with sentinel lymph node biopsy versus modified radical mastectomy ([id number]) 2.  CBC, CMP done 3.  Avoid taking aspirin 5 days before surgery 4.  Contact us if you have any concern  Patient verbalized understanding, all questions were answered, and were agreeable with the plan outlined above.   Herbert Pun, MD  Electronically signed by Herbert Pun, MD

## 2021-10-05 NOTE — H&P (Signed)
HISTORY OF PRESENT ILLNESS:    Yvonne Scott is a 61 y.o.female patient who comes for surgical evaluation of right breast cancer.  Yvonne Scott initially presented feeling a knot in the right breast since 2004.  As per patient she had a mammogram at that time that did not shows any masses.  She endorses that this mass has been growing since 2004.  She had a mammogram and ultrasound done recently in October 2022.  She was found with triple negative right breast cancer.  She was found with a 5 cm mass of the right breast.  There was no abnormal lymph node identified on ultrasound.  I personally evaluated the images.   Patient was never able to start chemotherapy during this month.  She wanted to transfer her care from [facility] to [facility].  She was evaluated by medical oncology.  Her right breast cancer is invasive mammary carcinoma, triple negative.  Recommendation was to start with neoadjuvant chemotherapy.  She started neoadjuvant chemotherapy guided by Dr. Tasia Catchings.  She has been having a lot of difficulty with the chemotherapy with significant weakness and infections.  It we decided to hold neoadjuvant chemotherapy and proceed with surgical management.  Patient had a follow-up ultrasound that shows some reduction of the mass now measuring 4 cm.  Abnormal lymph node not seen on this ultrasound.      PAST MEDICAL HISTORY:  Past Medical History:  Diagnosis Date   Breast cancer (CMS-HCC) 02/2021        PAST SURGICAL HISTORY:   History reviewed. No pertinent surgical history.       MEDICATIONS:  Outpatient Encounter Medications as of 10/05/2021  Medication Sig Dispense Refill   acetaminophen (TYLENOL) 500 MG tablet Take 2 tablets (1,000 mg total) by mouth as needed     albuterol 90 mcg/actuation inhaler as needed     calcium carbonate (TUMS E-X) 300 mg (750 mg) chewable tablet Take by mouth as needed     calcium carbonate-vitamin D3 (OS-CAL 500+D) 500 mg-5 mcg (200 unit) tablet Take 2 tablets by mouth 2  (two) times daily with meals     ibuprofen (MOTRIN) 200 MG tablet Take 1 tablet (200 mg total) by mouth every 6 (six) hours as needed for Headache     lidocaine-prilocaine (EMLA) cream as needed     magnesium chloride (SLOW-MAG) 71.5 mg DR tablet Take by mouth 2 (two) times daily     omeprazole (PRILOSEC) 20 MG DR capsule Take 20 mg by mouth once daily     potassium chloride (KLOR-CON) 20 MEQ ER tablet Take 20 mEq by mouth 2 (two) times daily     sucralfate (CARAFATE) 1 gram tablet Take by mouth 4 (four) times daily     dexAMETHasone (DECADRON) 4 MG tablet as directed (Patient not taking: Reported on 57/26/2035)     folic acid (FOLVITE) 1 MG tablet Take 1 mg by mouth once daily (Patient not taking: Reported on 03/02/2021)     gabapentin (NEURONTIN) 100 MG capsule Take 1 capsule (100 mg total) by mouth 3 (three) times daily (Patient not taking: Reported on 03/02/2021)     multivitamin with minerals tablet Take 1 tablet by mouth once daily (Patient not taking: Reported on 03/02/2021)     ondansetron (ZOFRAN) 8 MG tablet Take by mouth as needed (Patient not taking: Reported on 10/05/2021)     prochlorperazine (COMPAZINE) 10 MG tablet Take by mouth as needed (Patient not taking: Reported on 10/05/2021)     thiamine (VITAMIN  B-1) 100 MG tablet Take 0.5 tablets by mouth once daily (Patient not taking: Reported on 03/02/2021)     No facility-administered encounter medications on file as of 10/05/2021.     ALLERGIES:   Patient has no known allergies.   SOCIAL HISTORY:  Social History   Socioeconomic History   Marital status: Widowed  Tobacco Use   Smoking status: Every Day    Packs/day: 1.00    Types: Cigarettes   Smokeless tobacco: Never  Vaping Use   Vaping Use: Never used  Substance and Sexual Activity   Alcohol use: Yes    Comment: occasional   Drug use: Never    FAMILY HISTORY:  Family History  Problem Relation Age of Onset   Skin cancer Father    High blood pressure  (Hypertension) Sister    Diabetes Sister    No Known Problems Brother    High blood pressure (Hypertension) Maternal Grandmother    Anemia Maternal Grandmother      GENERAL REVIEW OF SYSTEMS:   General ROS: negative for - chills, fever, weight gain or weight loss.  Positive for fatigue Allergy and Immunology ROS: negative for - hives  Hematological and Lymphatic ROS: negative for - bleeding problems or bruising, negative for palpable nodes Endocrine ROS: negative for - heat or cold intolerance, hair changes Respiratory ROS: negative for - cough, shortness of breath or wheezing Cardiovascular ROS: no chest pain or palpitations GI ROS: negative for nausea, vomiting, abdominal pain, diarrhea, constipation Musculoskeletal ROS: Positive for - joint swelling or muscle pain Neurological ROS: negative for - confusion, syncope Dermatological ROS: negative for pruritus and rash  PHYSICAL EXAM:  Vitals:   10/05/21 1015  BP: (!) 140/82  Pulse: 85  .  Ht:160 cm ('5\' 3"'$ ) Wt:49 kg (108 lb) UVO:ZDGU surface area is 1.48 meters squared. Body mass index is 19.13 kg/m.Marland Kitchen   GENERAL: Alert, active, oriented x3  HEENT: Pupils equal reactive to light. Extraocular movements are intact. Sclera clear. Palpebral conjunctiva normal red color.Pharynx clear.  NECK: Supple with no palpable mass and no adenopathy.  LUNGS: Sound clear with no rales rhonchi or wheezes.  HEART: Regular rhythm S1 and S2 without murmur.  BREAST: Right breast large palpable mass.  No clinical axillary adenopathy.  No mass or axillary adenopathy identified on the left breast.  EXTREMITIES: Well-developed well-nourished symmetrical with no dependent edema.  NEUROLOGICAL: Awake alert oriented, facial expression symmetrical, moving all extremities.      IMPRESSION:     Malignant neoplasm of upper-outer quadrant of right breast in female, estrogen receptor negative (CMS-HCC) [C50.411, Z17.1]  Patient oriented about the  alternative of partial versus total mastectomy.  Due to the size of the mass I would recommend to proceed with partial mastectomy.  Patient oriented about the procedure.  Patient ended up with a receptive procedure.  Patient also oriented about the possibility of proceeding with sentinel node biopsy versus axillary node dissection converting the surgery to a modified radical mastectomy.  If sentinel nodes negative lymph node section will avoid axillary node dissection.  If sentinel lymph nodes positive in the frozen section, we will proceed with modified radical mastectomy.  Patient oriented about the risk of surgery includes pain, bleeding, infection, need of a drain, opening of the wound, needing surgical interventions to repair any of the previous complications, lymphedema of the upper extremity, among others.  The patient reports she understood the risks and agreed to proceed with plan.  PLAN:  1.  Right breast total mastectomy with sentinel lymph node biopsy versus modified radical mastectomy ([id number]) 2.  CBC, CMP done 3.  Avoid taking aspirin 5 days before surgery 4.  Contact us if you have any concern  Patient verbalized understanding, all questions were answered, and were agreeable with the plan outlined above.   Herbert Pun, MD  Electronically signed by Herbert Pun, MD

## 2021-10-05 NOTE — Telephone Encounter (Signed)
Patient scheduled for mastectomy on 8/8. Please schedule MD appt approx 2 weeks after surgery. Please inform pt/ mail appt reminder.

## 2021-10-10 ENCOUNTER — Other Ambulatory Visit: Payer: Self-pay | Admitting: Oncology

## 2021-10-10 MED ORDER — FOLIC ACID 1 MG PO TABS: 1.0000 mg | ORAL_TABLET | Freq: Every day | ORAL | 0 refills | Status: DC

## 2021-10-11 ENCOUNTER — Telehealth: Payer: Self-pay

## 2021-10-11 NOTE — Telephone Encounter (Signed)
Called and informed patient of folate level being low and Dr. Collie Siad recommendation to start folic acid supplementation. Advised rx sent to pharmacy. Patient confirms she picked up script.

## 2021-10-11 NOTE — Telephone Encounter (Signed)
-----   Message from Earlie Server, MD sent at 10/10/2021 12:26 AM EDT ----- Please let her know that folate level is low recommend folic acid supplementation. Rx sent

## 2021-10-14 ENCOUNTER — Encounter
Admission: RE | Admit: 2021-10-14 | Discharge: 2021-10-14 | Disposition: A | Discharge status: Home / Self Care | Payer: Medicaid Other | Source: Ambulatory Visit | Attending: General Surgery | Admitting: General Surgery

## 2021-10-14 HISTORY — DX: Essential (primary) hypertension: I10

## 2021-10-14 HISTORY — DX: Adverse effect of antineoplastic and immunosuppressive drugs, initial encounter: T45.1X5A

## 2021-10-14 HISTORY — DX: Hypokalemia: E87.6

## 2021-10-14 HISTORY — DX: Malignant neoplasm of unspecified site of right female breast: C50.911

## 2021-10-14 HISTORY — DX: Thrombocytopenia, unspecified: D69.6

## 2021-10-14 HISTORY — DX: Acidosis, unspecified: E87.20

## 2021-10-14 HISTORY — DX: Hypomagnesemia: E83.42

## 2021-10-14 HISTORY — DX: Drug-induced polyneuropathy: G62.0

## 2021-10-14 HISTORY — DX: Unspecified severe protein-calorie malnutrition: E43

## 2021-10-14 HISTORY — DX: Pneumonia, unspecified organism: J18.9

## 2021-10-14 HISTORY — DX: Acute kidney failure, unspecified: N17.9

## 2021-10-14 NOTE — Patient Instructions (Addendum)
Your procedure is scheduled on: Tuesday, August 8 At 11:30 am - - Report to the Registration Desk on the 1st floor of the [facility] and then you will be directed to the Radiology department.   REMEMBER: Instructions that are not followed completely may result in serious medical risk, up to and including death; or upon the discretion of your surgeon and anesthesiologist your surgery may need to be rescheduled.  Do not eat food after midnight the night before surgery.  No gum chewing, lozengers or hard candies.  You may however, drink CLEAR liquids up to 2 hours before you are scheduled to arrive for your surgery. Do not drink anything within 2 hours of your scheduled arrival time.  Clear liquids include: - water  - apple juice without pulp - gatorade (not RED colors) - black coffee or tea (Do NOT add milk or creamers to the coffee or tea) Do NOT drink anything that is not on this list.  TAKE THESE MEDICATIONS THE MORNING OF SURGERY WITH A SIP OF WATER:  Hydrocodone if needed for pain Lorazepam if needed for anxiety Omeprazole - (take one the night before and one on the morning of surgery - helps to prevent nausea after surgery.) Ventolin inhaler  Use inhalers on the day of surgery and bring to the hospital.  One week prior to surgery: starting today, August 3 Stop Anti-inflammatories (NSAIDS) such as Advil, Aleve, Ibuprofen, Motrin, Naproxen, Naprosyn and Aspirin based products such as Excedrin, Goodys Powder, BC Powder. Stop ANY OVER THE COUNTER supplements until after surgery. You may however, continue to take Tylenol if needed for pain up until the day of surgery.  No Alcohol for 24 hours before or after surgery.  No Smoking including e-cigarettes for 24 hours prior to surgery.  No chewable tobacco products for at least 6 hours prior to surgery.  No nicotine patches on the day of surgery.  Do not use any "recreational" drugs for at least a week prior to your surgery.   Please be advised that the combination of cocaine and anesthesia may have negative outcomes, up to and including death. If you test positive for cocaine, your surgery will be cancelled.  On the morning of surgery brush your teeth with toothpaste and water, you may rinse your mouth with mouthwash if you wish. Do not swallow any toothpaste or mouthwash.  Use CHG Soap as directed on instruction sheet.  Do not wear jewelry, make-up, hairpins, clips or nail polish.  Do not wear lotions, powders, or perfumes.   Do not shave body from the neck down 48 hours prior to surgery just in case you cut yourself which could leave a site for infection.  Also, freshly shaved skin may become irritated if using the CHG soap.  Contact lenses, hearing aids and dentures may not be worn into surgery.  Do not bring valuables to the hospital. [facility] is not responsible for any missing/lost belongings or valuables.   Notify your doctor if there is any change in your medical condition (cold, fever, infection).  Wear comfortable clothing (specific to your surgery type) to the hospital.  After surgery, you can help prevent lung complications by doing breathing exercises.  Take deep breaths and cough every 1-2 hours. Your doctor may order a device called an Incentive Spirometer to help you take deep breaths.  If you are being discharged the day of surgery, you will not be allowed to drive home. You will need a responsible adult (18 years or  older) to drive you home and stay with you that night.   If you are taking public transportation, you will need to have a responsible adult (18 years or older) with you. Please confirm with your physician that it is acceptable to use public transportation.   Please call the [facility] at [phone] if you have any questions about these instructions.  Surgery Visitation Policy:  Patients undergoing a surgery or procedure may have two family members  or support persons with them as long as the person is not COVID-19 positive or experiencing its symptoms.   Preparing for Surgery with [place] (CHG) Soap    Before surgery, you can play an important role by reducing the number of germs on your skin.  CHG (Chlorhexidine gluconate) soap is an antiseptic cleanser which kills germs and bonds with the skin to continue killing germs even after washing.  Please do not use if you have an allergy to CHG or antibacterial soaps. If your skin becomes reddened/irritated stop using the CHG.  1. Shower the NIGHT BEFORE SURGERY and the MORNING OF SURGERY with CHG soap.  2. If you choose to wash your hair, wash your hair first as usual with your normal shampoo.  3. After shampooing, rinse your hair and body thoroughly to remove the shampoo.  4. Use CHG as you would any other liquid soap. You can apply CHG directly to the skin and wash gently with a scrungie or a clean washcloth.  5. Apply the CHG soap to your body only from the neck down. Do not use on open wounds or open sores. Avoid contact with your eyes, ears, mouth, and genitals (private parts). Wash face and genitals (private parts) with your normal soap.  6. Wash thoroughly, paying special attention to the area where your surgery will be performed.  7. Thoroughly rinse your body with warm water.  8. Do not shower/wash with your normal soap after using and rinsing off the CHG soap.  9. Pat yourself dry with a clean towel.  10. Wear clean pajamas to bed the night before surgery.  12. Place clean sheets on your bed the night of your first shower and do not sleep with pets.  13. Shower again with the CHG soap on the day of surgery prior to arriving at the hospital.  14. Do not apply any deodorants/lotions/powders.  15. Please wear clean clothes to the hospital.

## 2021-10-19 ENCOUNTER — Ambulatory Visit: Payer: Medicaid Other | Admitting: Anesthesiology

## 2021-10-19 ENCOUNTER — Other Ambulatory Visit: Payer: Self-pay

## 2021-10-19 ENCOUNTER — Ambulatory Visit
Admission: RE | Admit: 2021-10-19 | Discharge: 2021-10-19 | Disposition: A | Discharge status: Home / Self Care | Payer: Medicaid Other | Source: Ambulatory Visit | Attending: General Surgery | Admitting: General Surgery

## 2021-10-19 ENCOUNTER — Encounter: Payer: Self-pay | Admitting: General Surgery

## 2021-10-19 ENCOUNTER — Encounter
Admission: RE | Disposition: A | Discharge status: Home / Self Care | Payer: Self-pay | Source: Home / Self Care | Attending: General Surgery

## 2021-10-19 ENCOUNTER — Observation Stay
Admission: RE | Admit: 2021-10-19 | Discharge: 2021-10-20 | Disposition: A | Discharge status: Home / Self Care | Payer: Medicaid Other | Attending: General Surgery | Admitting: General Surgery

## 2021-10-19 ENCOUNTER — Encounter
Admission: RE | Admit: 2021-10-19 | Discharge: 2021-10-19 | Disposition: A | Discharge status: Home / Self Care | Payer: Medicaid Other | Source: Ambulatory Visit | Attending: General Surgery | Admitting: General Surgery

## 2021-10-19 DIAGNOSIS — C50919 Malignant neoplasm of unspecified site of unspecified female breast: Secondary | ICD-10-CM | POA: Diagnosis present

## 2021-10-19 DIAGNOSIS — C50411 Malignant neoplasm of upper-outer quadrant of right female breast: Secondary | ICD-10-CM | POA: Diagnosis not present

## 2021-10-19 DIAGNOSIS — R928 Other abnormal and inconclusive findings on diagnostic imaging of breast: Secondary | ICD-10-CM

## 2021-10-19 DIAGNOSIS — Z79899 Other long term (current) drug therapy: Secondary | ICD-10-CM | POA: Diagnosis not present

## 2021-10-19 DIAGNOSIS — Z171 Estrogen receptor negative status [ER-]: Secondary | ICD-10-CM | POA: Insufficient documentation

## 2021-10-19 DIAGNOSIS — F17210 Nicotine dependence, cigarettes, uncomplicated: Secondary | ICD-10-CM | POA: Diagnosis not present

## 2021-10-19 HISTORY — PX: MASTECTOMY W/ SENTINEL NODE BIOPSY: SHX2001

## 2021-10-19 SURGERY — MASTECTOMY WITH SENTINEL LYMPH NODE BIOPSY
Anesthesia: General | Site: Breast | Laterality: Right

## 2021-10-19 MED ORDER — KETOROLAC TROMETHAMINE 30 MG/ML IJ SOLN
INTRAMUSCULAR | Status: AC
  Filled 2021-10-19: qty 1

## 2021-10-19 MED ORDER — PROCHLORPERAZINE EDISYLATE 10 MG/2ML IJ SOLN
5.0000 mg | Freq: Four times a day (QID) | INTRAMUSCULAR | Status: DC | PRN
  Administered 2021-10-19: 5 mg via INTRAVENOUS
  Filled 2021-10-19: qty 2

## 2021-10-19 MED ORDER — NICOTINE 7 MG/24HR TD PT24
7.0000 mg | MEDICATED_PATCH | Freq: Every day | TRANSDERMAL | Status: DC
  Administered 2021-10-20: 7 mg via TRANSDERMAL
  Filled 2021-10-19 (×2): qty 1

## 2021-10-19 MED ORDER — TECHNETIUM TC 99M TILMANOCEPT KIT
1.1000 | PACK | Freq: Once | INTRAVENOUS | Status: AC | PRN
  Administered 2021-10-19: 1.1 via INTRADERMAL

## 2021-10-19 MED ORDER — PANTOPRAZOLE SODIUM 40 MG PO TBEC
40.0000 mg | DELAYED_RELEASE_TABLET | Freq: Every day | ORAL | Status: DC
  Administered 2021-10-19 – 2021-10-20 (×2): 40 mg via ORAL
  Filled 2021-10-19 (×2): qty 1

## 2021-10-19 MED ORDER — DEXMEDETOMIDINE (PRECEDEX) IN NS 20 MCG/5ML (4 MCG/ML) IV SYRINGE
PREFILLED_SYRINGE | INTRAVENOUS | Status: DC | PRN
  Administered 2021-10-19 (×2): 8 ug via INTRAVENOUS

## 2021-10-19 MED ORDER — FOLIC ACID 1 MG PO TABS
1.0000 mg | ORAL_TABLET | Freq: Every day | ORAL | Status: DC
Start: 2021-10-19 — End: 2021-10-20
  Administered 2021-10-19 – 2021-10-20 (×2): 1 mg via ORAL
  Filled 2021-10-19 (×2): qty 1

## 2021-10-19 MED ORDER — SODIUM CHLORIDE 0.9 % IV SOLN: INTRAVENOUS | Status: DC

## 2021-10-19 MED ORDER — PROPOFOL 10 MG/ML IV BOLUS
INTRAVENOUS | Status: AC
  Filled 2021-10-19: qty 20

## 2021-10-19 MED ORDER — DROPERIDOL 2.5 MG/ML IJ SOLN
INTRAMUSCULAR | Status: AC
  Filled 2021-10-19: qty 2

## 2021-10-19 MED ORDER — HYDROCODONE-ACETAMINOPHEN 5-325 MG PO TABS
1.0000 | ORAL_TABLET | ORAL | Status: DC | PRN
  Administered 2021-10-19: 1 via ORAL
  Administered 2021-10-20 (×2): 2 via ORAL
  Filled 2021-10-19 (×2): qty 2
  Filled 2021-10-19: qty 1

## 2021-10-19 MED ORDER — BUPIVACAINE LIPOSOME 1.3 % IJ SUSP
INTRAMUSCULAR | Status: AC
  Filled 2021-10-19: qty 20

## 2021-10-19 MED ORDER — METHOCARBAMOL 500 MG PO TABS: 500.0000 mg | ORAL_TABLET | Freq: Four times a day (QID) | ORAL | Status: DC | PRN

## 2021-10-19 MED ORDER — FENTANYL CITRATE (PF) 100 MCG/2ML IJ SOLN
INTRAMUSCULAR | Status: DC | PRN
  Administered 2021-10-19: 100 ug via INTRAVENOUS

## 2021-10-19 MED ORDER — DEXAMETHASONE SODIUM PHOSPHATE 10 MG/ML IJ SOLN
INTRAMUSCULAR | Status: DC | PRN
  Administered 2021-10-19: 10 mg via INTRAVENOUS

## 2021-10-19 MED ORDER — CHLORHEXIDINE GLUCONATE 0.12 % MT SOLN
OROMUCOSAL | Status: AC
  Administered 2021-10-19: 15 mL via OROMUCOSAL
  Filled 2021-10-19: qty 15

## 2021-10-19 MED ORDER — CEFAZOLIN SODIUM-DEXTROSE 2-4 GM/100ML-% IV SOLN
INTRAVENOUS | Status: AC
  Filled 2021-10-19: qty 100

## 2021-10-19 MED ORDER — MIDAZOLAM HCL 2 MG/2ML IJ SOLN
INTRAMUSCULAR | Status: AC
  Filled 2021-10-19: qty 2

## 2021-10-19 MED ORDER — "VISTASEAL 4 ML SINGLE DOSE KIT "
PACK | CUTANEOUS | Status: DC | PRN
  Administered 2021-10-19: 4 mL via TOPICAL

## 2021-10-19 MED ORDER — MAGNESIUM CHLORIDE 64 MG PO TBEC
1.0000 | DELAYED_RELEASE_TABLET | Freq: Two times a day (BID) | ORAL | Status: DC
  Administered 2021-10-19 – 2021-10-20 (×2): 64 mg via ORAL
  Filled 2021-10-19 (×2): qty 1

## 2021-10-19 MED ORDER — FENTANYL CITRATE (PF) 100 MCG/2ML IJ SOLN: 25.0000 ug | INTRAMUSCULAR | Status: DC | PRN

## 2021-10-19 MED ORDER — LACTATED RINGERS IV SOLN: INTRAVENOUS | Status: DC

## 2021-10-19 MED ORDER — NYSTATIN 100000 UNIT/ML MT SUSP: 5.0000 mL | Freq: Four times a day (QID) | OROMUCOSAL | Status: DC

## 2021-10-19 MED ORDER — KETOROLAC TROMETHAMINE 30 MG/ML IJ SOLN
INTRAMUSCULAR | Status: DC | PRN
  Administered 2021-10-19: 15 mg via INTRAVENOUS

## 2021-10-19 MED ORDER — CEFAZOLIN SODIUM-DEXTROSE 2-4 GM/100ML-% IV SOLN
2.0000 g | INTRAVENOUS | Status: AC
  Administered 2021-10-19: 2 g via INTRAVENOUS

## 2021-10-19 MED ORDER — FENTANYL CITRATE (PF) 100 MCG/2ML IJ SOLN
INTRAMUSCULAR | Status: AC
  Filled 2021-10-19: qty 2

## 2021-10-19 MED ORDER — ENOXAPARIN SODIUM 40 MG/0.4ML IJ SOSY: 40.0000 mg | PREFILLED_SYRINGE | INTRAMUSCULAR | Status: DC

## 2021-10-19 MED ORDER — ALBUTEROL SULFATE (2.5 MG/3ML) 0.083% IN NEBU: 3.0000 mL | INHALATION_SOLUTION | Freq: Four times a day (QID) | RESPIRATORY_TRACT | Status: DC | PRN

## 2021-10-19 MED ORDER — ACETAMINOPHEN 10 MG/ML IV SOLN
INTRAVENOUS | Status: DC | PRN
  Administered 2021-10-19: 1000 mg via INTRAVENOUS

## 2021-10-19 MED ORDER — LIDOCAINE 2% (20 MG/ML) 5 ML SYRINGE
INTRAMUSCULAR | Status: DC | PRN
  Administered 2021-10-19: 80 mg via INTRAVENOUS

## 2021-10-19 MED ORDER — ROCURONIUM BROMIDE 10 MG/ML (PF) SYRINGE
PREFILLED_SYRINGE | INTRAVENOUS | Status: AC
  Filled 2021-10-19: qty 10

## 2021-10-19 MED ORDER — IBUPROFEN 400 MG PO TABS: 200.0000 mg | ORAL_TABLET | Freq: Four times a day (QID) | ORAL | Status: DC | PRN

## 2021-10-19 MED ORDER — CHLORHEXIDINE GLUCONATE 0.12 % MT SOLN: 15.0000 mL | Freq: Once | OROMUCOSAL | Status: AC

## 2021-10-19 MED ORDER — SEVOFLURANE IN SOLN
RESPIRATORY_TRACT | Status: AC
  Filled 2021-10-19: qty 250

## 2021-10-19 MED ORDER — LORAZEPAM 0.5 MG PO TABS: 0.5000 mg | ORAL_TABLET | Freq: Two times a day (BID) | ORAL | Status: DC | PRN

## 2021-10-19 MED ORDER — DROPERIDOL 2.5 MG/ML IJ SOLN
0.6250 mg | Freq: Once | INTRAMUSCULAR | Status: AC
Start: 2021-10-19 — End: 2021-10-19
  Administered 2021-10-19: 0.625 mg via INTRAVENOUS

## 2021-10-19 MED ORDER — ONDANSETRON HCL 4 MG/2ML IJ SOLN
INTRAMUSCULAR | Status: DC | PRN
  Administered 2021-10-19: 4 mg via INTRAVENOUS

## 2021-10-19 MED ORDER — POTASSIUM CHLORIDE CRYS ER 20 MEQ PO TBCR
20.0000 meq | EXTENDED_RELEASE_TABLET | Freq: Every day | ORAL | Status: DC
  Administered 2021-10-19 – 2021-10-20 (×2): 20 meq via ORAL
  Filled 2021-10-19 (×2): qty 1

## 2021-10-19 MED ORDER — ACETAMINOPHEN 10 MG/ML IV SOLN
INTRAVENOUS | Status: AC
  Filled 2021-10-19: qty 100

## 2021-10-19 MED ORDER — PHENYLEPHRINE HCL-NACL 20-0.9 MG/250ML-% IV SOLN
INTRAVENOUS | Status: DC | PRN
  Administered 2021-10-19: 35 ug/min via INTRAVENOUS

## 2021-10-19 MED ORDER — MIDAZOLAM HCL 2 MG/2ML IJ SOLN
INTRAMUSCULAR | Status: DC | PRN
  Administered 2021-10-19: 2 mg via INTRAVENOUS

## 2021-10-19 MED ORDER — PROPOFOL 10 MG/ML IV BOLUS
INTRAVENOUS | Status: DC | PRN
  Administered 2021-10-19: 40 mg via INTRAVENOUS
  Administered 2021-10-19: 120 mg via INTRAVENOUS

## 2021-10-19 MED ORDER — HYDROMORPHONE HCL 1 MG/ML IJ SOLN
INTRAMUSCULAR | Status: AC
  Filled 2021-10-19: qty 1

## 2021-10-19 MED ORDER — HYDROMORPHONE HCL 1 MG/ML IJ SOLN
INTRAMUSCULAR | Status: DC | PRN
  Administered 2021-10-19: .25 mg via INTRAVENOUS
  Administered 2021-10-19: .5 mg via INTRAVENOUS

## 2021-10-19 MED ORDER — ROCURONIUM BROMIDE 10 MG/ML (PF) SYRINGE
PREFILLED_SYRINGE | INTRAVENOUS | Status: DC | PRN
  Administered 2021-10-19: 50 mg via INTRAVENOUS

## 2021-10-19 MED ORDER — OXYCODONE HCL 5 MG PO TABS: 5.0000 mg | ORAL_TABLET | Freq: Once | ORAL | Status: DC | PRN

## 2021-10-19 MED ORDER — OXYCODONE HCL 5 MG/5ML PO SOLN: 5.0000 mg | Freq: Once | ORAL | Status: DC | PRN

## 2021-10-19 MED ORDER — PHENYLEPHRINE 80 MCG/ML (10ML) SYRINGE FOR IV PUSH (FOR BLOOD PRESSURE SUPPORT)
PREFILLED_SYRINGE | INTRAVENOUS | Status: DC | PRN
  Administered 2021-10-19 (×3): 160 ug via INTRAVENOUS
  Administered 2021-10-19: 80 ug via INTRAVENOUS

## 2021-10-19 MED ORDER — MORPHINE SULFATE (PF) 4 MG/ML IV SOLN
4.0000 mg | INTRAVENOUS | Status: DC | PRN
  Administered 2021-10-19: 4 mg via INTRAVENOUS
  Filled 2021-10-19: qty 1

## 2021-10-19 MED ORDER — ENOXAPARIN SODIUM 30 MG/0.3ML IJ SOSY
30.0000 mg | PREFILLED_SYRINGE | INTRAMUSCULAR | Status: DC
  Administered 2021-10-20: 30 mg via SUBCUTANEOUS
  Filled 2021-10-19: qty 0.3

## 2021-10-19 MED ORDER — ORAL CARE MOUTH RINSE: 15.0000 mL | Freq: Once | OROMUCOSAL | Status: AC

## 2021-10-19 MED ORDER — BUPIVACAINE-EPINEPHRINE (PF) 0.5% -1:200000 IJ SOLN
INTRAMUSCULAR | Status: AC
  Filled 2021-10-19: qty 30

## 2021-10-19 MED ORDER — CALCIUM CARBONATE ANTACID 500 MG PO CHEW: 3.0000 | CHEWABLE_TABLET | Freq: Four times a day (QID) | ORAL | Status: DC | PRN

## 2021-10-19 MED ORDER — PROCHLORPERAZINE MALEATE 10 MG PO TABS
10.0000 mg | ORAL_TABLET | Freq: Four times a day (QID) | ORAL | Status: DC | PRN
Start: 2021-10-19 — End: 2021-10-20

## 2021-10-19 MED ORDER — STERILE WATER FOR IRRIGATION IR SOLN
Status: DC | PRN
  Administered 2021-10-19: 1000 mL

## 2021-10-19 MED ORDER — SUCRALFATE 1 G PO TABS: 1.0000 g | ORAL_TABLET | Freq: Three times a day (TID) | ORAL | Status: DC

## 2021-10-19 MED ORDER — BUPIVACAINE-EPINEPHRINE (PF) 0.5% -1:200000 IJ SOLN
INTRAMUSCULAR | Status: DC | PRN
  Administered 2021-10-19: 50 mL via INTRAMUSCULAR

## 2021-10-19 SURGICAL SUPPLY — 41 items
BINDER BREAST LRG (GAUZE/BANDAGES/DRESSINGS) ×1 IMPLANT
BULB RESERV EVAC DRAIN JP 100C (MISCELLANEOUS) ×2 IMPLANT
CHLORAPREP W/TINT 26 (MISCELLANEOUS) ×2 IMPLANT
COVER PROBE GAMMA FINDER SLV (MISCELLANEOUS) ×2 IMPLANT
DERMABOND ADVANCED (GAUZE/BANDAGES/DRESSINGS) ×1
DERMABOND ADVANCED .7 DNX12 (GAUZE/BANDAGES/DRESSINGS) ×1 IMPLANT
DRAIN CHANNEL JP 15F RND 16 (MISCELLANEOUS) ×2 IMPLANT
DRAPE LAPAROTOMY TRNSV 106X77 (MISCELLANEOUS) ×2 IMPLANT
DRSG GAUZE FLUFF 36X18 (GAUZE/BANDAGES/DRESSINGS) ×2 IMPLANT
ELECT REM PT RETURN 9FT ADLT (ELECTROSURGICAL) ×2
ELECTRODE REM PT RTRN 9FT ADLT (ELECTROSURGICAL) ×1 IMPLANT
GAUZE 4X4 16PLY ~~LOC~~+RFID DBL (SPONGE) ×1 IMPLANT
GAUZE SPONGE 4X4 12PLY STRL (GAUZE/BANDAGES/DRESSINGS) ×1 IMPLANT
GLOVE BIO SURGEON STRL SZ 6.5 (GLOVE) ×3 IMPLANT
GLOVE BIOGEL PI IND STRL 6.5 (GLOVE) ×1 IMPLANT
GLOVE BIOGEL PI INDICATOR 6.5 (GLOVE) ×2
GOWN STRL REUS W/ TWL LRG LVL3 (GOWN DISPOSABLE) ×2 IMPLANT
GOWN STRL REUS W/TWL LRG LVL3 (GOWN DISPOSABLE) ×2
KIT TURNOVER KIT A (KITS) ×2 IMPLANT
LABEL OR SOLS (LABEL) ×2 IMPLANT
MANIFOLD NEPTUNE II (INSTRUMENTS) ×2 IMPLANT
MARGIN MAP 10MM (MISCELLANEOUS) ×1 IMPLANT
PACK BASIN MAJOR ARMC (MISCELLANEOUS) ×2 IMPLANT
PAD ABD DERMACEA PRESS 5X9 (GAUZE/BANDAGES/DRESSINGS) ×1 IMPLANT
SPONGE T-LAP 18X18 ~~LOC~~+RFID (SPONGE) ×4 IMPLANT
SUT ETHILON 3-0 FS-10 30 BLK (SUTURE) ×2
SUT ETHILON 4-0 (SUTURE)
SUT ETHILON 4-0 FS2 18XMFL BLK (SUTURE)
SUT MNCRL 4-0 (SUTURE)
SUT MNCRL 4-0 27XMFL (SUTURE)
SUT SILK 2 0 SH (SUTURE) IMPLANT
SUT SILK 3-0 (SUTURE) ×1 IMPLANT
SUT VIC AB 3-0 54X BRD REEL (SUTURE) ×1 IMPLANT
SUT VIC AB 3-0 BRD 54 (SUTURE)
SUT VIC AB 3-0 SH 27 (SUTURE)
SUT VIC AB 3-0 SH 27X BRD (SUTURE) ×2 IMPLANT
SUT VIC AB 3-0 SH 8-18 (SUTURE) ×3 IMPLANT
SUTURE EHLN 3-0 FS-10 30 BLK (SUTURE) ×1 IMPLANT
SUTURE ETHLN 4-0 FS2 18XMF BLK (SUTURE) IMPLANT
SUTURE MNCRL 4-0 27XMF (SUTURE) ×2 IMPLANT
WATER STERILE IRR 500ML POUR (IV SOLUTION) ×2 IMPLANT

## 2021-10-19 NOTE — Interval H&P Note (Signed)
History and Physical Interval Note:  10/19/2021 12:52 PM  Yvonne Scott  has presented today for surgery, with the diagnosis of C50.411,Z17.1 Malignant neoplasmof upper-outer quadrant of Rt breast in female, estrogen receptor negative.  The various methods of treatment have been discussed with the patient and family. After consideration of risks, benefits and other options for treatment, the patient has consented to  Procedure(s): MASTECTOMY WITH SENTINEL LYMPH NODE BIOPSY (total vs modified radical) (Right) as a surgical intervention.  The patient's history has been reviewed, patient examined, no change in status, stable for surgery.  I have reviewed the patient's chart and labs.  Questions were answered to the patient's satisfaction.     Herbert Pun

## 2021-10-19 NOTE — Transfer of Care (Signed)
Immediate Anesthesia Transfer of Care Note  Patient: Yvonne Scott  Procedure(s) Performed: MASTECTOMY WITH SENTINEL LYMPH NODE BIOPSY ( modified radical) (Right: Breast)  Patient Location: PACU  Anesthesia Type:General  Level of Consciousness: drowsy  Airway & Oxygen Therapy: Patient Spontanous Breathing  Post-op Assessment: Report given to RN and Post -op Vital signs reviewed and stable  Post vital signs: Reviewed and stable  Last Vitals:  Vitals Value Taken Time  BP 119/73 10/19/21 1532  Temp 36.3 C 10/19/21 1532  Pulse 86 10/19/21 1535  Resp 15 10/19/21 1535  SpO2 94 % 10/19/21 1535  Vitals shown include unvalidated device data.  Last Pain:  Vitals:   10/19/21 1532  TempSrc:   PainSc: Asleep         Complications: No notable events documented.

## 2021-10-19 NOTE — Op Note (Signed)
Preoperative diagnosis: Carcinoma of the right breast.   Postoperative diagnosis: Carcinoma of the right breast.  Procedure: Right modified radical mastectomy.  Anesthesia: GETA  Surgeon: Dr. Windell Moment  Wound Classification: Clean   Indications: Patient is a 61 y.o. female who had an abnormal mammogram that on workup with core needle biopsy was found to be invasive mammary carcinoma of the breast with abnormal appearing lymph nodes. She was demonstrated to have axillary node involvement by imaging and palpable on clinical exam. After discussion of alternatives, the patient would benefit best of modified radical mastectomy as oncologic treatment.   Findings: 1. Palpable abnormal lymph nodes intra operatively  Description of procedure: The patient was brought to the operating room and the site of surgery was confirmed. General anesthesia was induced. A time-out was completed verifying correct patient, procedure, site, positioning, and implant(s) and/or special equipment prior to beginning this procedure. The right breast, chest wall, axilla, and upper arm and neck were prepped and draped in the usual sterile fashion.  A skin incision was made that encompassed the nipple-areola complex and the previous biopsy scar and passed in a generally oblique direction across the breast. Flaps were raised in the avascular plane between subcutaneous tissue and breast tissue from clavicle superiorly, the sternum medially, the anterior rectus sheath inferiorly, and the anterior border of the latissimus dorsi muscle laterally. Hemostasis was achieved in the flaps. Next, the breast tissue and underlying pectoralis fascia were excised from the pectoralis major muscle, progressing from medially to laterally. At the lateral border of the pectoralis major muscle, the breast tissue was swung laterally and dissection progressed under the muscle.  The clavipectoral fascia was then incised along the edge of the pectoralis  major and the pectoralis major and minor were freed from surrounding fat and nodal tissue. Dissection progressed first under the pectoralis major and then under the pectoralis minor muscle. The pectoral muscles were retracted medially with a Richardson retractor. The medial pectoral neurovascular bundle was identified and preserved. The level II nodal tissue deep in the pectoralis minor was included in the dissection. The axillary vein was then identified and cleared of overlying fat. The first branch off the axillary vein was clamped, divided, and tied with 2-0 silk ties. The thoracodorsal nerve was then identified deep to the ligated vein and preserved. The long thoracic nerve was then identified along the edge of the latissimus dorsi on the chest wall and preserved. The remaining nodal tissue between these nerves was then carefully removed, taking care to protect the nerves. The specimen, consisting of breast and attached nodal tissue, was then excised by dividing the remaining lateral pedical and sent to pathology.  The wound was irrigated and hemostasis was achieved.  A closed suction drains were brought into the operative field through a separate stab incision and sutured to the skin with a 3-0 nylon suture. The wound was closed with interrupted 3-0 Vicryl to the subcutaneous layer, followed by a subcuticular layer of Monocryl 4-0. The wound was dressed.  The patient tolerated the procedure well and was taken to the postanesthesia care unit in stable condition.   Sentinel Node Biopsy Synoptic Operative Report  Operation performed with curative intent:Yes  Tracer(s) used to identify sentinel nodes in the upfront surgery (non-neoadjuvant) setting (select all that apply):Radioactive Tracer  Tracer(s) used to identify sentinel nodes in the neoadjuvant setting (select all that apply):N/A  All nodes (colored or non-colored) present at the end of a dye-filled lymphatic channel were removed:N/A  All  significantly radioactive nodes were removed:Yes  All palpable suspicious nodes were removed:Yes  Biopsy-proven positive nodes marked with clips prior to chemotherapy were identified and removed:N/A  Specimen: Right Modified Radical Mastectomy  Complications: None  Estimated Blood Loss: 25 mL

## 2021-10-19 NOTE — Anesthesia Procedure Notes (Signed)
Procedure Name: Intubation Date/Time: 10/19/2021 1:06 PM  Performed by: Rande Brunt, CRNAPre-anesthesia Checklist: Patient identified, Patient being monitored, Timeout performed, Emergency Drugs available and Suction available Patient Re-evaluated:Patient Re-evaluated prior to induction Oxygen Delivery Method: Circle system utilized Preoxygenation: Pre-oxygenation with 100% oxygen Induction Type: IV induction Ventilation: Mask ventilation without difficulty Laryngoscope Size: 3 and McGraph Grade View: Grade I Tube type: Oral Tube size: 7.0 mm Number of attempts: 1 Airway Equipment and Method: Stylet Placement Confirmation: ETT inserted through vocal cords under direct vision, positive ETCO2 and breath sounds checked- equal and bilateral Secured at: 21 cm Tube secured with: Tape Dental Injury: Teeth and Oropharynx as per pre-operative assessment

## 2021-10-19 NOTE — Progress Notes (Signed)
PHARMACIST - PHYSICIAN COMMUNICATION  CONCERNING:  Enoxaparin (Lovenox) for DVT Prophylaxis    RECOMMENDATION: Patient was prescribed enoxaprin '40mg'$  q24 hours for VTE prophylaxis.   Filed Weights   10/19/21 1237  Weight: 44 kg (97 lb)    Body mass index is 17.74 kg/m.  CrCl cannot be calculated (Patient's most recent lab result is older than the maximum 21 days allowed.).  Patient is candidate for enoxaparin '30mg'$  every 24 hours based on Weight <45kg  DESCRIPTION: Pharmacy has adjusted enoxaparin dose per [facility] policy.  Patient is now receiving enoxaparin 30 mg every 24 hours   Gretel Acre, PharmD PGY1 Pharmacy Resident 10/19/2021 4:39 PM

## 2021-10-19 NOTE — Anesthesia Preprocedure Evaluation (Signed)
Anesthesia Evaluation  Patient identified by MRN, date of birth, ID band Patient awake    Reviewed: Allergy & Precautions, NPO status , Patient's Chart, lab work & pertinent test results  History of Anesthesia Complications Negative for: history of anesthetic complications  Airway Mallampati: II  TM Distance: >3 FB Neck ROM: full    Dental  (+) Missing, Poor Dentition   Pulmonary neg pulmonary ROS, neg COPD, Current Smoker and Patient abstained from smoking.,    Pulmonary exam normal        Cardiovascular hypertension, (-) angina(-) CAD, (-) Past MI and (-) CABG negative cardio ROS Normal cardiovascular exam     Neuro/Psych PSYCHIATRIC DISORDERS negative neurological ROS     GI/Hepatic negative GI ROS, Neg liver ROS,   Endo/Other  negative endocrine ROS  Renal/GU      Musculoskeletal   Abdominal   Peds  Hematology negative hematology ROS (+)   Anesthesia Other Findings Past Medical History: No date: Acute kidney injury ([place]) No date: Anxiety 10/24/2016: Aortic atherosclerosis ([facility]) No date: Breast cancer ([place]) No date: Chemotherapy-induced neuropathy ([facility]) No date: Essential hypertension No date: GERD (gastroesophageal reflux disease) No date: Hypokalemia No date: Hypomagnesemia No date: Invasive ductal carcinoma of right breast ([facility]) No date: Metabolic acidosis No date: Personal history of chemotherapy No date: Pneumonia No date: Severe protein-calorie malnutrition ([place]) No date: Thrombocytopenia [facility])  Past Surgical History: 2022: BREAST BIOPSY; Right No date: FOOT SURGERY; Left     Comment:  little toe correction 01/19/2021: PORTA CATH INSERTION  BMI    Body Mass Index: 17.74 kg/m      Reproductive/Obstetrics negative OB ROS                             Anesthesia Physical Anesthesia Plan  ASA: 2  Anesthesia Plan: General ETT   Post-op Pain Management:     Induction: Intravenous  PONV Risk Score and Plan: 3 and Ondansetron, Dexamethasone, Midazolam and Treatment may vary due to age or medical condition  Airway Management Planned: Oral ETT  Additional Equipment:   Intra-op Plan:   Post-operative Plan: Extubation in OR  Informed Consent: I have reviewed the patients History and Physical, chart, labs and discussed the procedure including the risks, benefits and alternatives for the proposed anesthesia with the patient or authorized representative who has indicated his/her understanding and acceptance.     Dental Advisory Given  Plan Discussed with: Anesthesiologist, CRNA and Surgeon  Anesthesia Plan Comments: (Patient consented for risks of anesthesia including but not limited to:  - adverse reactions to medications - damage to eyes, teeth, lips or other oral mucosa - nerve damage due to positioning  - sore throat or hoarseness - Damage to heart, brain, nerves, lungs, other parts of body or loss of life  Patient voiced understanding.)        Anesthesia Quick Evaluation

## 2021-10-20 ENCOUNTER — Encounter: Payer: Self-pay | Admitting: General Surgery

## 2021-10-20 DIAGNOSIS — C50411 Malignant neoplasm of upper-outer quadrant of right female breast: Secondary | ICD-10-CM | POA: Diagnosis not present

## 2021-10-20 MED ORDER — METHOCARBAMOL 500 MG PO TABS: 500.0000 mg | ORAL_TABLET | Freq: Three times a day (TID) | ORAL | 0 refills | Status: DC | PRN

## 2021-10-20 MED ORDER — ADULT MULTIVITAMIN W/MINERALS CH: 1.0000 | ORAL_TABLET | Freq: Every day | ORAL | Status: DC

## 2021-10-20 MED ORDER — ENSURE ENLIVE PO LIQD: 237.0000 mL | Freq: Three times a day (TID) | ORAL | Status: DC

## 2021-10-20 MED ORDER — HYDROCODONE-ACETAMINOPHEN 5-325 MG PO TABS: 1.0000 | ORAL_TABLET | ORAL | 0 refills | Status: DC | PRN

## 2021-10-20 NOTE — Progress Notes (Signed)
Pt discharged to home. VSS. Patient mother is picking her up; denies pain. Discharged paperwork and medications discussed with patients. All questions/concerns addressed. All needs met; no further needs at this time.   10/20/21 1420  Vitals  Temp 98.3 F (36.8 C)  Temp Source Oral  BP 109/67  MAP (mmHg) 81  BP Location Left Arm  BP Method Automatic  Patient Position (if appropriate) Sitting  Pulse Rate 96  Pulse Rate Source Monitor  Resp 16  Level of Consciousness  Level of Consciousness Alert  MEWS COLOR  MEWS Score Color Green  Oxygen Therapy  SpO2 96 %  O2 Device Room Air  Pain Assessment  Pain Scale 0-10  Pain Score 0

## 2021-10-20 NOTE — Discharge Summary (Signed)
Patient ID: [name] MRN: [id number] DOB/AGE: Jul 21, 1960 61 y.o.  Admit date: 10/19/2021 Discharge date: 10/20/2021   Discharge Diagnoses:  Active Problems:   Breast cancer [facility])   Procedures: Right modified radical mastectomy  Hospital Course: Patient admitted with right breast cancer.  She underwent right modified radical mastectomy.  She has been recovering adequately.  Pain controlled with current pain medications.  Patient tolerating diet.  Drain with adequate output.  Physical Exam Vitals reviewed.  Constitutional:      Appearance: Normal appearance.  HENT:     Head: Normocephalic.  Cardiovascular:     Rate and Rhythm: Normal rate and regular rhythm.  Pulmonary:     Effort: Pulmonary effort is normal.     Breath sounds: Normal breath sounds.  Abdominal:     General: Abdomen is flat.  Musculoskeletal:     Cervical back: Normal range of motion.  Skin:    General: Skin is warm.     Capillary Refill: Capillary refill takes less than 2 seconds.  Neurological:     General: No focal deficit present.     Mental Status: She is alert and oriented to person, place, and time.   Right breast absent.  Right chest wound with healthy flaps.  No fluid collection.  Drain with serosanguineous fluid.   Consults: None  Disposition: Discharge disposition: 01-Home or Self Care       Discharge Instructions     Diet - low sodium heart healthy   Complete by: As directed    Increase activity slowly   Complete by: As directed       Allergies as of 10/20/2021   No Known Allergies      Medication List     TAKE these medications    calcium carbonate 750 MG chewable tablet Commonly known as: TUMS EX Chew 2 tablets by mouth 4 (four) times daily as needed for heartburn.   calcium-vitamin D 500-5 MG-MCG tablet Commonly known as: OSCAL WITH D Take 2 tablets by mouth daily.   dexamethasone 4 MG tablet Commonly known as: DECADRON TAKE 2 TABLETS BY MOUTH ONCE DAILY  FOR 2 DAYS AFTER  CHEMOTHERAPY.  TAKE  WITH  FOOD.   folic acid 1 MG tablet Commonly known as: FOLVITE Take 1 tablet (1 mg total) by mouth daily.   HYDROcodone-acetaminophen 5-325 MG tablet Commonly known as: NORCO/VICODIN Take 1 tablet by mouth every 4 (four) hours as needed for moderate pain.   ibuprofen 200 MG tablet Commonly known as: ADVIL Take 200-400 mg by mouth every 6 (six) hours as needed for headache.   lidocaine-prilocaine cream Commonly known as: EMLA Apply to affected area once   LORazepam 0.5 MG tablet Commonly known as: ATIVAN Take 1 tablet (0.5 mg total) by mouth every 12 (twelve) hours as needed for anxiety or sleep (or nausea).   magic mouthwash (multi-ingredient) oral suspension Take 5 mLs by mouth 4 (four) times daily as needed for mouth pain.   magnesium chloride 64 MG Tbec SR tablet Commonly known as: SLOW-MAG Take 1 tablet (64 mg total) by mouth 2 (two) times daily.   methocarbamol 500 MG tablet Commonly known as: ROBAXIN Take 1 tablet (500 mg total) by mouth every 8 (eight) hours as needed for muscle spasms.   neomycin-bacitracin-polymyxin Oint Commonly known as: NEOSPORIN Apply 1 Application topically 2 (two) times daily. What changed: additional instructions   nicotine 7 mg/24hr patch Commonly known as: NICODERM CQ - dosed in mg/24 hr Place 7 mg onto  the skin daily as needed.   nystatin 100000 UNIT/ML suspension Commonly known as: MYCOSTATIN Take 5 mLs (500,000 Units total) by mouth 4 (four) times daily.   omeprazole 20 MG capsule Commonly known as: PRILOSEC Take 20 mg by mouth 2 (two) times daily before a meal.   ondansetron 8 MG tablet Commonly known as: ZOFRAN TAKE 1 TABLET BY MOUTH TWICE DAILY AS NEEDED -  START  THE  3RD  DAY  AFTER  CARBOPLATIN  AND  AC  CHEMOTHERAPY   potassium chloride SA 20 MEQ tablet Commonly known as: KLOR-CON M Take 1 tablet (20 mEq total) by mouth daily.   sucralfate 1 g tablet Commonly known as:  Carafate Take 1 tablet (1 g total) by mouth 4 (four) times daily -  with meals and at bedtime.   Ventolin HFA 108 (90 Base) MCG/ACT inhaler Generic drug: albuterol Inhale 1-2 puffs into the lungs every 6 (six) hours as needed for wheezing or shortness of breath.        Follow-up Information     Herbert Pun, MD Follow up in 1 week(s).   Specialty: General Surgery Why: For wound re-check after mastectomy Contact information: [place] [id number] [phone]

## 2021-10-20 NOTE — Anesthesia Postprocedure Evaluation (Signed)
Anesthesia Post Note  Patient: CLOMA RAHRIG  Procedure(s) Performed: MASTECTOMY WITH SENTINEL LYMPH NODE BIOPSY ( modified radical) (Right: Breast)  Patient location during evaluation: PACU Anesthesia Type: General Level of consciousness: awake and alert Pain management: pain level controlled Vital Signs Assessment: post-procedure vital signs reviewed and stable Respiratory status: spontaneous breathing, nonlabored ventilation, respiratory function stable and patient connected to nasal cannula oxygen Cardiovascular status: blood pressure returned to baseline and stable Postop Assessment: no apparent nausea or vomiting Anesthetic complications: no   No notable events documented.   Last Vitals:  Vitals:   10/20/21 0501 10/20/21 0804  BP: 101/61 102/63  Pulse: 76 72  Resp: 16   Temp: 36.8 C 37 C  SpO2: 99% 97%    Last Pain:  Vitals:   10/20/21 0804  TempSrc: Oral  PainSc:                  Dimas Millin

## 2021-10-20 NOTE — Plan of Care (Signed)
  Problem: Education: Goal: Knowledge of General Education information will improve Description: Including pain rating scale, medication(s)/side effects and non-pharmacologic comfort measures Outcome: Adequate for Discharge   Problem: Health Behavior/Discharge Planning: Goal: Ability to manage health-related needs will improve Outcome: Adequate for Discharge   Problem: Clinical Measurements: Goal: Ability to maintain clinical measurements within normal limits will improve Outcome: Adequate for Discharge Goal: Will remain free from infection Outcome: Adequate for Discharge Goal: Diagnostic test results will improve Outcome: Adequate for Discharge Goal: Respiratory complications will improve Outcome: Adequate for Discharge Goal: Cardiovascular complication will be avoided Outcome: Adequate for Discharge   Problem: Activity: Goal: Risk for activity intolerance will decrease Outcome: Adequate for Discharge   Problem: Nutrition: Goal: Adequate nutrition will be maintained Outcome: Adequate for Discharge   Problem: Coping: Goal: Level of anxiety will decrease Outcome: Adequate for Discharge   Problem: Elimination: Goal: Will not experience complications related to bowel motility Outcome: Adequate for Discharge Goal: Will not experience complications related to urinary retention Outcome: Adequate for Discharge   Problem: Pain Managment: Goal: General experience of comfort will improve Outcome: Adequate for Discharge   Problem: Safety: Goal: Ability to remain free from injury will improve Outcome: Adequate for Discharge   Problem: Skin Integrity: Goal: Risk for impaired skin integrity will decrease Outcome: Adequate for Discharge   Problem: Malnutrition  (NI-5.2) Goal: Food and/or nutrient delivery Description: Individualized approach for food/nutrient provision. Outcome: Adequate for Discharge   

## 2021-10-20 NOTE — Progress Notes (Addendum)
Initial Nutrition Assessment  DOCUMENTATION CODES:   Severe malnutrition in context of chronic illness  INTERVENTION:   Ensure Enlive po TID, each supplement provides 350 kcal and 20 grams of protein.  Magic cup TID with meals, each supplement provides 290 kcal and 9 grams of protein  MVI po daily   Liberalize diet   Pt at high refeed risk; recommend monitor potassium, magnesium and phosphorus labs daily until stable  NUTRITION DIAGNOSIS:   Severe Malnutrition related to cancer and cancer related treatments as evidenced by 12 percent weight loss in 7 months, moderate fat depletion, moderate to severe muscle depletion.  GOAL:   Patient will meet greater than or equal to 90% of their needs  MONITOR:   PO intake, Supplement acceptance, Labs, Weight trends, Skin, I & O's  REASON FOR ASSESSMENT:   Malnutrition Screening Tool    ASSESSMENT:   61 y/o female with h/o GERD, HTN, fatty liver, thrombocytopenia, anxiety, HTN and R breast cancer s/p chemotherapy who is admitted s/p right modified radical mastectomy 8/8.  Met with pt in room today. Pt reports fair appetite and oral intake pta. Pt is followed by the Dietitian at the Cancer center. Pt has been having issues with mouth sores while taking chemotherapy but reports this is improved. Pt reports that she has not been drinking any supplements at home. Pt reports that she was drinking Ensure, but then she developed diarrhea and she quit drinking them. Pt reports that she later found out that the diarrhea was coming from the magnesium she was taking. Pt denies any issues with milk products and reports that she loves milk and ice cream. Pt reports eating 100% of a bowel of grits for breakfast this morning. Pt reports that she eats only with plastic silverware per her oncologist's instructions. RD discussed with pt the importance of adequate nutrition needed to preserve lean muscle and to support post op healing. Pt reports that she is  willing to try chocolate Ensure again. Advised pt that if she can't tolerate the Ensure to try and find a brand that does not upset her stomach. RD will add supplements and MVI to help pt meet her estimated needs. RD will also liberalize pt's diet. Pt is at high refeed risk. Per chart, pt is down 13lbs(12%) over the past 7 months; this is significant weight loss.   Medications reviewed and include: lovenox, folic acid, MVI, protonix, nicotine, Kcl, Mg chloride, NaCl _0 /hr   Labs reviewed: K 3.6 wnl, Mg 1.5(L) Wbc- 12.4(L)  Drains- 44m   NUTRITION - FOCUSED PHYSICAL EXAM:  Flowsheet Row Most Recent Value  Orbital Region Mild depletion  Upper Arm Region Moderate depletion  Thoracic and Lumbar Region Mild depletion  Buccal Region Mild depletion  Temple Region Mild depletion  Clavicle Bone Region Moderate depletion  Clavicle and Acromion Bone Region Moderate depletion  Scapular Bone Region Moderate depletion  Dorsal Hand Moderate depletion  Patellar Region Severe depletion  Anterior Thigh Region Severe depletion  Posterior Calf Region Severe depletion  Edema (RD Assessment) None  Hair Reviewed  Eyes Reviewed  Mouth Reviewed  Skin Reviewed  Nails Reviewed   Diet Order:   Diet Order             Diet regular Room service appropriate? Yes; Fluid consistency: Thin  Diet effective now           Diet - low sodium heart healthy  EDUCATION NEEDS:   Education needs have been addressed  Skin:  Skin Assessment: Reviewed RN Assessment (incision R breast)  Last BM:  pta  Height:   Ht Readings from Last 1 Encounters:  10/19/21 _0  (1.575 m)    Weight:   Wt Readings from Last 1 Encounters:  10/19/21 44 kg    Ideal Body Weight:  50 kg  BMI:  Body mass index is 17.74 kg/m.  Estimated Nutritional Needs:   Kcal:  1500-1700kcal/day  Protein:  75-85g/day  Fluid:  1.3-1.5L/day  Koleen Distance MS, RD, LDN Please refer to [facility] for RD and/or RD  on-call/weekend/after hours pager

## 2021-10-20 NOTE — TOC CM/SW Note (Signed)
Patient has orders to discharge home today. Chart reviewed. PCP is Evern Bio, NP. On room air. No discharge wound care needs. No TOC needs identified. CSW signing off.  Dayton Scrape, [place]

## 2021-10-20 NOTE — Discharge Instructions (Signed)
  Diet: Resume home heart healthy regular diet.   Activity: No heavy lifting >20 pounds (children, pets, laundry, garbage) or strenuous activity until follow-up, but light activity and walking are encouraged. Do not drive or drink alcohol if taking narcotic pain medications.  Wound care: May shower with soapy water and pat dry (do not rub incisions), but no baths or submerging incision underwater until follow-up. (no swimming)  Chart drain output daily.   Medications: Resume all home medications. For mild to moderate pain: acetaminophen (Tylenol) or ibuprofen (if no kidney disease). Combining Tylenol with alcohol can substantially increase your risk of causing liver disease. Narcotic pain medications, if prescribed, can be used for severe pain, though may cause nausea, constipation, and drowsiness. Do not combine Tylenol and Norco within a 6 hour period as Norco contains Tylenol. If you do not need the narcotic pain medication, you do not need to fill the prescription.  Call office [phone]) at any time if any questions, worsening pain, fevers/chills, bleeding, drainage from incision site, or other concerns.

## 2021-10-22 ENCOUNTER — Other Ambulatory Visit: Payer: Self-pay | Admitting: Anatomic Pathology & Clinical Pathology

## 2021-10-25 ENCOUNTER — Other Ambulatory Visit: Payer: Self-pay | Admitting: Nurse Practitioner

## 2021-10-27 ENCOUNTER — Other Ambulatory Visit: Payer: Self-pay | Admitting: *Deleted

## 2021-10-27 MED ORDER — LORAZEPAM 0.5 MG PO TABS: 0.5000 mg | ORAL_TABLET | Freq: Two times a day (BID) | ORAL | 0 refills | Status: DC | PRN

## 2021-11-02 ENCOUNTER — Encounter: Payer: Self-pay | Admitting: Oncology

## 2021-11-02 ENCOUNTER — Inpatient Hospital Stay: Payer: Medicaid Other

## 2021-11-02 ENCOUNTER — Inpatient Hospital Stay: Payer: Medicaid Other | Attending: Oncology | Admitting: Oncology

## 2021-11-02 VITALS — BP 125/81 | HR 101 | Temp 99.0°F | Resp 18 | Wt 95.3 lb

## 2021-11-02 DIAGNOSIS — Z171 Estrogen receptor negative status [ER-]: Secondary | ICD-10-CM | POA: Diagnosis not present

## 2021-11-02 DIAGNOSIS — Z79899 Other long term (current) drug therapy: Secondary | ICD-10-CM | POA: Insufficient documentation

## 2021-11-02 DIAGNOSIS — T451X5A Adverse effect of antineoplastic and immunosuppressive drugs, initial encounter: Secondary | ICD-10-CM | POA: Diagnosis not present

## 2021-11-02 DIAGNOSIS — Z720 Tobacco use: Secondary | ICD-10-CM

## 2021-11-02 DIAGNOSIS — Z9011 Acquired absence of right breast and nipple: Secondary | ICD-10-CM | POA: Insufficient documentation

## 2021-11-02 DIAGNOSIS — C50911 Malignant neoplasm of unspecified site of right female breast: Secondary | ICD-10-CM

## 2021-11-02 DIAGNOSIS — G620 Drug-induced polyneuropathy: Secondary | ICD-10-CM | POA: Insufficient documentation

## 2021-11-02 DIAGNOSIS — Z95828 Presence of other vascular implants and grafts: Secondary | ICD-10-CM

## 2021-11-02 DIAGNOSIS — F17210 Nicotine dependence, cigarettes, uncomplicated: Secondary | ICD-10-CM | POA: Diagnosis not present

## 2021-11-02 MED ORDER — SODIUM CHLORIDE 0.9% FLUSH
10.0000 mL | Freq: Once | INTRAVENOUS | Status: AC
  Administered 2021-11-02: 10 mL via INTRAVENOUS
  Filled 2021-11-02: qty 10

## 2021-11-02 MED ORDER — HEPARIN SOD (PORK) LOCK FLUSH 100 UNIT/ML IV SOLN
500.0000 [IU] | Freq: Once | INTRAVENOUS | Status: AC
  Administered 2021-11-02: 500 [IU] via INTRAVENOUS
  Filled 2021-11-02: qty 5

## 2021-11-02 NOTE — Progress Notes (Unsigned)
Pt here for follow up. Pt reports that she is having 8/10 pain under left arm. She also states that she would like advise on how to proceed because she lost a tooth and she feels she has toenail fungus.

## 2021-11-03 NOTE — Assessment & Plan Note (Signed)
Smoke cessation was discussed with patient. 

## 2021-11-03 NOTE — Assessment & Plan Note (Signed)
Grade 1 Continue gabapentin 100 mg daily.

## 2021-11-03 NOTE — Assessment & Plan Note (Addendum)
Clinical stage III triple negative right breast cancer. cT3 cN0-1 S/p  neoadjuvant carboplatin Taxol Keytruda followed by [facility] with Keytruda x 3.   4th cycle of AC with Beryle Flock was not given due to poor tolerance. Patient is not interested in chemotherapy in adjuvant setting. Discussed xeloda, keytruda options. She is undecided. Will follow up on her decisions.  Status post right mastectomy with sentinel lymph node biopsy. ypT2 ypN0 Continue clinical surveillance.

## 2021-11-03 NOTE — Progress Notes (Addendum)
Hematology/Oncology Progress note [phone] [phone]         Patient Care Team: [name], NP as PCP - General (Nurse Practitioner)  REFERRING PROVIDER: [name], NP   ASSESSMENT & PLAN:   Cancer Staging  Invasive ductal carcinoma of right breast [facility]) Staging form: Breast, AJCC 8th Edition - Pathologic stage from 10/19/2021: No Stage Recommended (ypT2, pN0, cM0, G3, ER-, PR-, HER2-) - Signed by Earlie Server, MD on 11/03/2021   Invasive ductal carcinoma of right breast ([place]) Clinical stage III triple negative right breast cancer. cT3 cN0-1 S/p  neoadjuvant carboplatin Taxol Keytruda followed by [facility] with Keytruda x 3.   4th cycle of AC with Beryle Flock was not given due to poor tolerance. Patient is not interested in chemotherapy in adjuvant setting. Discussed xeloda, keytruda options. She is undecided. Will follow up on her decisions.  Status post right mastectomy with sentinel lymph node biopsy. ypT2 ypN0 Continue clinical surveillance.    Chemotherapy-induced neuropathy (HCC) Grade 1 Continue gabapentin 100 mg daily.     Tobacco abuse Smoke cessation was discussed with patient.   Orders Placed This Encounter  Procedures   MM 3D SCREEN BREAST UNI LEFT    Standing Status:   Future    Standing Expiration Date:   11/03/2022    Order Specific Question:   Reason for Exam (SYMPTOM  OR DIAGNOSIS REQUIRED)    Answer:   breast cancer    Order Specific Question:   Preferred imaging location?    Answer:   [facility]   CBC with Differential/Platelet    Standing Status:   Future    Standing Expiration Date:   11/03/2022   Comprehensive metabolic panel    Standing Status:   Future    Standing Expiration Date:   11/02/2022   Cancer antigen 15-3    Standing Status:   Future    Standing Expiration Date:   11/03/2022   Cancer antigen 27.29    Standing Status:   Future    Standing Expiration Date:   11/03/2022   Follow-up in 3  months.  All questions were answered. The patient knows to call the clinic with any problems, questions or concerns.  Earlie Server, MD, PhD [facility] Hematology Oncology 11/02/2021      CHIEF COMPLAINTS/REASON FOR VISIT:  Follow-up for triple negative right breast cancer.  HISTORY OF PRESENTING ILLNESS:   Yvonne Scott is a  61 y.o.  female presents for follow-up of triple negative breast cancer. Oncology History  Invasive ductal carcinoma of right breast ([place])  02/20/2021 Initial Diagnosis   Invasive ductal carcinoma of right breast (HCC)\  -August 2022, patient was diagnosed with right breast stage IIIb cT3 N0M0, grade 2, ER negative, PR negative HER2 negative breast cancer.  Patient self palpated breast mass many years ago.  10/21/2020 diagnostic mammogram and ultrasound confirmed presence of mass and a subsequent biopsy revealed right breast triple negative cancer. There was plan for patient to start with neoadjuvant chemotherapy followed by surgery and radiation.  Unfortunately patient never started treatment.    Medi port was [facility] at [facility].  Established care with me on 02/19/2021    02/24/2021 Imaging   CT chest abdomen pelvis showed large right breast mass, 5.5 x 4.8 cm with small right axillary lymph node with variable degrees of enhancement potentially involved but not specific based on size.  This tracked down to retropectoral nodal stations largest lymph node along the lateral margin of the pectoralis  major.  Tiny pulmonary nodules in the chest as discussed.  These are nonspecific but would consider short interval follow-up to assess for any changes.  No evidence of metastatic disease involving the abdomen or pelvis.  Aortic atherosclerosis.   02/25/2021 Echocardiogram   echocardiogram showed LVEF 65 to 70%.  Patient has been to chemotherapy class and understands antiemetics instructions   03/11/2021 - 08/26/2021 Chemotherapy   BREAST Pembrolizumab (200) D1 + Carboplatin  (5) D1 + Paclitaxel (80) D1,8,15 q21d X 4 cycles, followed by Pembrolizumab (200) D1 + AC D1 q21d x 3 cycles. 4th cycle was omitted due to poor tolerance.     03/16/2021 Imaging   bone scan showed no evidence of osseous metastatic breast cancer, asymmetric osseous uptake on the right at L5-S1, corresponding to degenerative disc disease on recent CT.  Asymmetric soft tissue uptake in the right breast   07/13/2021 Echocardiogram   Echocardiogram showed LVEF 60-65%.  Grade 1 diastolic dysfunction.  Refer details to echocardiogram reports.   10/19/2021 Surgery   Patient underwent right modified radical mastectomy  Pathology showed invasive mammary carcinoma, no special type, multifocal, with focal chondroid stroma.  Clip and biopsy sites identified.  12 lymph nodes negative for malignancy.  2 lymph nodes displaying dense fibrous scarring, compatible with pathological complete response.  Benign nipple/areola.   Grade 3, LVI not identified, DCIS not identified.  All margins negative for invasive carcinoma.   ypT2 ypN0  Comment Due to the presence of dense fibrosis and treatment effect in two of the sampled lymph nodes, pancytokeratin stains were performed on all submitted lymph nodes.  All lymph nodes are negative for residual metastatic carcinoma.  A separate focus of invasive carcinoma, measuring approximately 11 mm in greatest extent, and histologically similar to the primary tumor, is identified within sampling of the fibrous tissue adjacent to the primary tumor.    10/19/2021 Cancer Staging   Staging form: Breast, AJCC 8th Edition - Pathologic stage from 10/19/2021: No Stage Recommended (ypT2, pN0, cM0, G3, ER-, PR-, HER2-) - Signed by Earlie Server, MD on 11/03/2021 Stage prefix: Post-therapy Response to neoadjuvant therapy: Partial response Multigene prognostic tests performed: None Histologic grading system: 3 grade system    07/16/2021-07/19/2021, patient was admitted for HCAP patient was treated with  antibiotics 09/04/2021 - 09/08/2021 Patient was admitted due to pancytopenia, thrush electrolyte abnormality.     INTERVAL HISTORY NASREEN GOEDECKE is a 61 y.o. female who has above history reviewed by me today presents for follow up visit for management of triple negative breast cancer, clinical stage IIIB Status post right mastectomy with sentinel lymph node biopsy. Today patient reports feeling okay.  Soreness/postsurgical pain at the site of her mastectomy Thickening toenail for couple of months..    Review of Systems  Constitutional:  Positive for fatigue. Negative for appetite change, chills and fever.  HENT:   Negative for hearing loss, mouth sores and voice change.   Eyes:  Negative for eye problems.  Respiratory:  Negative for chest tightness and cough.   Cardiovascular:  Negative for chest pain.  Gastrointestinal:  Negative for abdominal distention, abdominal pain, blood in stool, diarrhea and nausea.  Endocrine: Negative for hot flashes.  Genitourinary:  Negative for difficulty urinating, dysuria and frequency.   Musculoskeletal:  Negative for arthralgias.  Skin:  Negative for itching and rash.  Neurological:  Positive for numbness. Negative for extremity weakness.  Hematological:  Negative for adenopathy.  Psychiatric/Behavioral:  Negative for confusion. The patient is not nervous/anxious.  MEDICAL HISTORY:  Past Medical History:  Diagnosis Date   Acute kidney injury ([place])    Anxiety    Aortic atherosclerosis ([place]) 10/24/2016   Breast cancer (HCC)    Chemotherapy-induced neuropathy (HCC)    Essential hypertension    GERD (gastroesophageal reflux disease)    Hypokalemia    Hypomagnesemia    Invasive ductal carcinoma of right breast (HCC)    Metabolic acidosis    Personal history of chemotherapy    Pneumonia    Severe protein-calorie malnutrition (HCC)    Thrombocytopenia (HCC)     SURGICAL HISTORY: Past Surgical History:  Procedure Laterality Date    BREAST BIOPSY Right 2022   FOOT SURGERY Left    little toe correction   MASTECTOMY W/ SENTINEL NODE BIOPSY Right 10/19/2021   Procedure: MASTECTOMY WITH SENTINEL LYMPH NODE BIOPSY ( modified radical);  Surgeon: Herbert Pun, MD;  Location: [facility] ORS;  Service: General;  Laterality: Right;   PORTA CATH INSERTION  01/19/2021    SOCIAL HISTORY: Social History   Socioeconomic History   Marital status: Widowed    Spouse name: Not on file   Number of children: 1   Years of education: Not on file   Highest education level: Not on file  Occupational History   Not on file  Tobacco Use   Smoking status: Every Day    Packs/day: 1.00    Years: 36.00    Total pack years: 36.00    Types: Cigarettes    Start date: 03/15/1979   Smokeless tobacco: Never  Vaping Use   Vaping Use: Never used  Substance and Sexual Activity   Alcohol use: No    Alcohol/week: 3.0 standard drinks of alcohol    Types: 3 Standard drinks or equivalent per week   Drug use: Not Currently   Sexual activity: Not Currently    Birth control/protection: None  Other Topics Concern   Not on file  Social History Narrative   Lives with mother   Social Determinants of Health   Financial Resource Strain: Not on file  Food Insecurity: Not on file  Transportation Needs: Not on file  Physical Activity: Not on file  Stress: Not on file  Social Connections: Not on file  Intimate Partner Violence: Not on file    FAMILY HISTORY: Family History  Problem Relation Age of Onset   Cancer Mother 44       breast   Cancer Father        melonma   Cirrhosis Maternal Grandfather    Cancer Paternal Grandmother        skin/breast    ALLERGIES:  has No Known Allergies.  MEDICATIONS:  Current Outpatient Medications  Medication Sig Dispense Refill   calcium carbonate (TUMS EX) 750 MG chewable tablet Chew 2 tablets by mouth 4 (four) times daily as needed for heartburn.     calcium-vitamin D (OSCAL WITH D) 500-5 MG-MCG  tablet Take 2 tablets by mouth daily. 60 tablet 5   HYDROcodone-acetaminophen (NORCO/VICODIN) 5-325 MG tablet Take 1 tablet by mouth every 4 (four) hours as needed for moderate pain. 20 tablet 0   lidocaine-prilocaine (EMLA) cream Apply to affected area once 30 g 3   LORazepam (ATIVAN) 0.5 MG tablet Take 1 tablet (0.5 mg total) by mouth every 12 (twelve) hours as needed for anxiety or sleep (or nausea). 30 tablet 0   magnesium chloride (SLOW-MAG) 64 MG TBEC SR tablet Take 1 tablet (64 mg total) by mouth 2 (two) times daily.  60 tablet 2   methocarbamol (ROBAXIN) 500 MG tablet Take 1 tablet (500 mg total) by mouth every 8 (eight) hours as needed for muscle spasms. 30 tablet 0   neomycin-bacitracin-polymyxin (NEOSPORIN) OINT Apply 1 Application topically 2 (two) times daily. (Patient taking differently: Apply 1 Application topically 2 (two) times daily. Nose sore) 1 g 0   nystatin (MYCOSTATIN) 100000 UNIT/ML suspension Take 5 mLs (500,000 Units total) by mouth 4 (four) times daily. 60 mL 0   omeprazole (PRILOSEC) 20 MG capsule Take 20 mg by mouth 2 (two) times daily before a meal.     ondansetron (ZOFRAN) 8 MG tablet TAKE 1 TABLET BY MOUTH TWICE DAILY AS NEEDED -  START  THE  3RD  DAY  AFTER  CARBOPLATIN  AND  AC  CHEMOTHERAPY 90 tablet 0   potassium chloride SA (KLOR-CON M) 20 MEQ tablet Take 1 tablet (20 mEq total) by mouth daily. 90 tablet 0   sucralfate (CARAFATE) 1 g tablet Take 1 tablet (1 g total) by mouth 4 (four) times daily -  with meals and at bedtime. 120 tablet 0   VENTOLIN HFA 108 (90 Base) MCG/ACT inhaler Inhale 1-2 puffs into the lungs every 6 (six) hours as needed for wheezing or shortness of breath.     dexamethasone (DECADRON) 4 MG tablet TAKE 2 TABLETS BY MOUTH ONCE DAILY FOR 2 DAYS AFTER  CHEMOTHERAPY.  TAKE  WITH  FOOD. (Patient not taking: Reported on 10/14/2021) 15 tablet 0   folic acid (FOLVITE) 1 MG tablet Take 1 tablet (1 mg total) by mouth daily. (Patient not taking: Reported on  11/02/2021) 60 tablet 0   ibuprofen (ADVIL) 200 MG tablet Take 200-400 mg by mouth every 6 (six) hours as needed for headache. (Patient not taking: Reported on 11/02/2021)     magic mouthwash (multi-ingredient) oral suspension Take 5 mLs by mouth 4 (four) times daily as needed for mouth pain. (Patient not taking: Reported on 11/02/2021) 480 mL 1   nicotine (NICODERM CQ - DOSED IN MG/24 HR) 7 mg/24hr patch Place 7 mg onto the skin daily as needed. (Patient not taking: Reported on 11/02/2021)     No current facility-administered medications for this visit.   Facility-Administered Medications Ordered in Other Visits  Medication Dose Route Frequency Provider Last Rate Last Admin   famotidine (PEPCID) 20-0.9 MG/50ML-% IVPB              PHYSICAL EXAMINATION: ECOG PERFORMANCE STATUS: 1 - Symptomatic but completely ambulatory Vitals:   11/02/21 1337  BP: 125/81  Pulse: (!) 101  Resp: 18  Temp: 99 F (37.2 C)   Filed Weights   11/02/21 1337  Weight: 95 lb 4.8 oz (43.2 kg)    Physical Exam Constitutional:      General: She is not in acute distress. HENT:     Head: Normocephalic and atraumatic.  Eyes:     General: No scleral icterus. Cardiovascular:     Rate and Rhythm: Normal rate and regular rhythm.     Heart sounds: Normal heart sounds.  Pulmonary:     Effort: Pulmonary effort is normal. No respiratory distress.     Breath sounds: No wheezing.  Abdominal:     General: Bowel sounds are normal. There is no distension.     Palpations: Abdomen is soft.  Musculoskeletal:        General: No deformity. Normal range of motion.     Cervical back: Normal range of motion and neck supple.  Skin:  General: Skin is warm and dry.     Findings: No erythema or rash.  Neurological:     Mental Status: She is alert and oriented to person, place, and time. Mental status is at baseline.     Cranial Nerves: No cranial nerve deficit.     Coordination: Coordination normal.  Psychiatric:         Mood and Affect: Mood normal.   Right mastectomy, healing surgical site, no focal drainage.   LABORATORY DATA:  I have reviewed the data as listed Lab Results  Component Value Date   WBC 12.4 (H) 09/15/2021   HGB 11.2 (L) 09/15/2021   HCT 33.7 (L) 09/15/2021   MCV 92.1 09/15/2021   PLT 313 09/15/2021   Recent Labs    08/31/21 0853 09/04/21 1320 09/05/21 0823 09/05/21 1700 09/06/21 0638 09/08/21 0515 09/15/21 0843  NA 134* 132* 136  --  136  --  137  K 3.4* 3.0* 2.6*   < > 3.6 3.4* 3.6  CL 102 100 117*  --  110  --  101  CO2 24 20* 16*  --  22  --  27  GLUCOSE 81 111* 78  --  88  --  82  BUN '15 22 8  ' --  <5*  --  8  CREATININE 0.63 0.65 0.34*  --  0.40*  --  0.79  CALCIUM 8.8* 8.6* 6.4*  --  8.5*  --  9.2  GFRNONAA >60 >60 >60  --  >60  --  >60  PROT 6.3* 6.3*  --   --   --   --  6.4*  ALBUMIN 3.5 3.2*  --   --   --   --  3.5  AST 15 15  --   --   --   --  19  ALT 13 13  --   --   --   --  12  ALKPHOS 55 60  --   --   --   --  65  BILITOT 0.3 0.7  --   --   --   --  0.5   < > = values in this interval not displayed.    Iron/TIBC/Ferritin/ %Sat    Component Value Date/Time   IRON 40 08/18/2021 1134   TIBC 234 (L) 08/18/2021 1134   FERRITIN 202 08/18/2021 1134   IRONPCTSAT 17 08/18/2021 1134   IRONPCTSAT 28 10/24/2016 1534       RADIOGRAPHIC STUDIES: I have personally reviewed the radiological images as listed and agreed with the findings in the report. NM Sentinel Node Inj-No Rpt (Breast)  Result Date: 10/19/2021 Sulfur Colloid was injected by the Nuclear Medicine Technologist for sentinel lymph node localization.

## 2021-11-09 LAB — SURGICAL PATHOLOGY

## 2021-11-17 ENCOUNTER — Inpatient Hospital Stay: Payer: Medicaid Other | Attending: Oncology | Admitting: Hospice and Palliative Medicine

## 2021-11-17 DIAGNOSIS — C50911 Malignant neoplasm of unspecified site of right female breast: Secondary | ICD-10-CM

## 2021-11-17 DIAGNOSIS — Z515 Encounter for palliative care: Secondary | ICD-10-CM

## 2021-11-17 NOTE — Progress Notes (Signed)
Voicemail left.  Will reschedule. °

## 2021-11-19 ENCOUNTER — Telehealth: Payer: Self-pay

## 2021-11-19 NOTE — Telephone Encounter (Signed)
Nutrition  Called patient for nutrition follow-up.  No answer.  Left message with call back number.  Chart reviewed  Noted right radical mastectomy on 8/8.    Weight on 8/22 95 lb  Currently on surveillance following surgery.  Dietrick Barris B. Zenia Resides, [place], [place] Registered Dietitian [phone]

## 2021-11-24 ENCOUNTER — Ambulatory Visit
Admission: RE | Admit: 2021-11-24 | Discharge: 2021-11-24 | Disposition: A | Discharge status: Home / Self Care | Payer: Medicaid Other | Source: Ambulatory Visit | Attending: Oncology | Admitting: Oncology

## 2021-11-24 DIAGNOSIS — Z1231 Encounter for screening mammogram for malignant neoplasm of breast: Secondary | ICD-10-CM | POA: Diagnosis present

## 2021-11-24 DIAGNOSIS — C50911 Malignant neoplasm of unspecified site of right female breast: Secondary | ICD-10-CM | POA: Diagnosis not present

## 2021-11-26 ENCOUNTER — Telehealth: Payer: Self-pay

## 2021-11-26 ENCOUNTER — Other Ambulatory Visit: Payer: Self-pay | Admitting: Oncology

## 2021-11-26 DIAGNOSIS — R928 Other abnormal and inconclusive findings on diagnostic imaging of breast: Secondary | ICD-10-CM

## 2021-11-26 DIAGNOSIS — N6489 Other specified disorders of breast: Secondary | ICD-10-CM

## 2021-11-26 NOTE — Telephone Encounter (Signed)
Nutrition  Called patient for nutrition follow-up.  Mother answered and said patient was in the bathroom.  Yvonne Scott that they have had a recent loss in the family and would be better to call back at a later time.  Will reach back out as able in few weeks.  Anais Koenen B. Zenia Resides, [place], [place] Registered Dietitian [phone]

## 2021-11-26 NOTE — Telephone Encounter (Signed)
Nutrition  Patient returned RD's call.    States that she found her sister dead this week.  Her brother was hit by a car in 05/25/21 and died.  She is tearful during call.  "I have got a lot going on right now."  "I have been receiving the calls about a "new treatment" but I can't do anything else right now."  Patient wanted RD to let Dr Tasia Catchings and team know about her recent loss.    RD offered LCSW services to help her work through what she is going through but patient declined at this time. She knows this service is available if needed in the future.   RD sent message to Dr Tasia Catchings and team as patient requested.   RD available as needed  Abbott Jasinski B. Zenia Resides, [place], [place] Registered Dietitian [phone]

## 2021-11-29 ENCOUNTER — Telehealth: Payer: Self-pay

## 2021-11-29 DIAGNOSIS — R928 Other abnormal and inconclusive findings on diagnostic imaging of breast: Secondary | ICD-10-CM

## 2021-11-29 NOTE — Telephone Encounter (Signed)
-----   Message from Earlie Server, MD sent at 11/29/2021  8:51 AM EDT ----- Recommend Diagnostic mammogram and possibly ultrasound of the left breast. Further evaluation is suggested for possible asymmetry in the left breast. Please add MD follow up 2-3 days after diagnostic mammogram

## 2021-11-29 NOTE — Telephone Encounter (Signed)
Called and spoke to pt and she states she found her sister dead and has a lot going on. She can't do anything right now. Advised her to keep appts in November, but to call us back asap to schedule. She said she would call back.

## 2021-12-28 ENCOUNTER — Inpatient Hospital Stay: Payer: Medicaid Other | Attending: Oncology

## 2021-12-28 DIAGNOSIS — R531 Weakness: Secondary | ICD-10-CM | POA: Insufficient documentation

## 2021-12-28 DIAGNOSIS — F329 Major depressive disorder, single episode, unspecified: Secondary | ICD-10-CM | POA: Insufficient documentation

## 2021-12-28 DIAGNOSIS — Z171 Estrogen receptor negative status [ER-]: Secondary | ICD-10-CM | POA: Insufficient documentation

## 2021-12-28 DIAGNOSIS — C50911 Malignant neoplasm of unspecified site of right female breast: Secondary | ICD-10-CM | POA: Insufficient documentation

## 2021-12-28 DIAGNOSIS — R5383 Other fatigue: Secondary | ICD-10-CM | POA: Insufficient documentation

## 2022-01-03 ENCOUNTER — Other Ambulatory Visit: Payer: Self-pay

## 2022-01-03 DIAGNOSIS — C50911 Malignant neoplasm of unspecified site of right female breast: Secondary | ICD-10-CM

## 2022-01-05 ENCOUNTER — Inpatient Hospital Stay: Payer: Medicaid Other

## 2022-01-05 ENCOUNTER — Inpatient Hospital Stay (HOSPITAL_BASED_OUTPATIENT_CLINIC_OR_DEPARTMENT_OTHER): Payer: Medicaid Other | Admitting: Hospice and Palliative Medicine

## 2022-01-05 VITALS — BP 133/84 | HR 102 | Temp 99.4°F | Resp 16

## 2022-01-05 DIAGNOSIS — Z171 Estrogen receptor negative status [ER-]: Secondary | ICD-10-CM | POA: Diagnosis not present

## 2022-01-05 DIAGNOSIS — F329 Major depressive disorder, single episode, unspecified: Secondary | ICD-10-CM | POA: Diagnosis not present

## 2022-01-05 DIAGNOSIS — C50911 Malignant neoplasm of unspecified site of right female breast: Secondary | ICD-10-CM

## 2022-01-05 DIAGNOSIS — R5383 Other fatigue: Secondary | ICD-10-CM

## 2022-01-05 DIAGNOSIS — R531 Weakness: Secondary | ICD-10-CM | POA: Diagnosis not present

## 2022-01-05 DIAGNOSIS — E860 Dehydration: Secondary | ICD-10-CM

## 2022-01-05 LAB — CBC
HCT: 40.1 % (ref 36.0–46.0)
Hemoglobin: 13.5 g/dL (ref 12.0–15.0)
MCH: 31 pg (ref 26.0–34.0)
MCHC: 33.7 g/dL (ref 30.0–36.0)
MCV: 92 fL (ref 80.0–100.0)
Platelets: 138 10*3/uL — ABNORMAL LOW (ref 150–400)
RBC: 4.36 MIL/uL (ref 3.87–5.11)
RDW: 14.4 % (ref 11.5–15.5)
WBC: 6.2 10*3/uL (ref 4.0–10.5)
nRBC: 0 % (ref 0.0–0.2)

## 2022-01-05 LAB — COMPREHENSIVE METABOLIC PANEL
ALT: 13 U/L (ref 0–44)
AST: 21 U/L (ref 15–41)
Albumin: 3.9 g/dL (ref 3.5–5.0)
Alkaline Phosphatase: 67 U/L (ref 38–126)
Anion gap: 7 (ref 5–15)
BUN: 7 mg/dL — ABNORMAL LOW (ref 8–23)
CO2: 22 mmol/L (ref 22–32)
Calcium: 8.7 mg/dL — ABNORMAL LOW (ref 8.9–10.3)
Chloride: 108 mmol/L (ref 98–111)
Creatinine, Ser: 0.6 mg/dL (ref 0.44–1.00)
GFR, Estimated: >60 mL/min (ref >60)
Glucose, Bld: 122 mg/dL — ABNORMAL HIGH (ref 70–99)
Potassium: 3.7 mmol/L (ref 3.5–5.1)
Sodium: 137 mmol/L (ref 135–145)
Total Bilirubin: 0.5 mg/dL (ref 0.3–1.2)
Total Protein: 6.8 g/dL (ref 6.5–8.1)

## 2022-01-05 LAB — TSH: TSH: 3.335 u[IU]/mL (ref 0.350–4.500)

## 2022-01-05 LAB — VITAMIN D 25 HYDROXY (VIT D DEFICIENCY, FRACTURES): Vit D, 25-Hydroxy: 21.5 ng/mL — ABNORMAL LOW (ref 30–100)

## 2022-01-05 LAB — FOLATE: Folate: 5.5 ng/mL — ABNORMAL LOW (ref >5.9)

## 2022-01-05 LAB — VITAMIN B12: Vitamin B-12: 123 pg/mL — ABNORMAL LOW (ref 180–914)

## 2022-01-05 MED ORDER — SODIUM CHLORIDE 0.9% FLUSH
10.0000 mL | Freq: Once | INTRAVENOUS | Status: AC
  Administered 2022-01-05: 10 mL via INTRAVENOUS
  Filled 2022-01-05: qty 10

## 2022-01-05 MED ORDER — HEPARIN SOD (PORK) LOCK FLUSH 100 UNIT/ML IV SOLN
500.0000 [IU] | Freq: Once | INTRAVENOUS | Status: AC
  Administered 2022-01-05: 500 [IU] via INTRAVENOUS
  Filled 2022-01-05: qty 5

## 2022-01-05 MED ORDER — SODIUM CHLORIDE 0.9 % IV SOLN
Freq: Once | INTRAVENOUS | Status: AC
  Filled 2022-01-05: qty 250

## 2022-01-05 MED ORDER — SERTRALINE HCL 25 MG PO TABS: 25.0000 mg | ORAL_TABLET | Freq: Every day | ORAL | 3 refills | Status: DC

## 2022-01-05 MED ORDER — DEXAMETHASONE SODIUM PHOSPHATE 10 MG/ML IJ SOLN
4.0000 mg | Freq: Once | INTRAMUSCULAR | Status: AC
  Administered 2022-01-05: 4 mg via INTRAVENOUS
  Filled 2022-01-05: qty 1

## 2022-01-05 NOTE — Progress Notes (Signed)
Symptom Management [place] at [facility] [phone] [phone]  Patient Care Team: [name], NP as PCP - General (Nurse Practitioner)   NAME OF PATIENT: Yvonne Scott  [id number]  January 08, 1961   DATE OF VISIT: 01/05/22  REASON FOR CONSULT: Yvonne Scott is a 61 y.o. female with multiple medical problems including stage III triple negative right breast cancer status post neoadjuvant CarboTaxol Keytruda followed by [facility].  She underwent right mastectomy.  Patient ultimately was undecided about adjuvant treatment.  Patient most recently has been on clinical surveillance.  INTERVAL HISTORY: Patient last saw Dr. Tasia Catchings on 11/02/2021 at which time patient was undecided about future treatment.  Plan was for clinical surveillance with 49-monthfollow-up.  Patient presents to clinic today for evaluation of fatigue.  She says that she has felt "rundown" since the recent passing of her sister.  She says that she is trying to stay busy to keep her mind off things.  She has been cleaning out her sister's house.  She denies insomnia but says that appetite has decreased some.  Patient says that she primarily came in today to check labs.  Denies any neurologic complaints. Denies recent fevers or illnesses. Denies any easy bleeding or bruising. Denies chest pain. Denies any nausea, vomiting, constipation, or diarrhea. Denies urinary complaints. Patient offers no further specific complaints today.   PAST MEDICAL HISTORY: Past Medical History:  Diagnosis Date  . Acute kidney injury ([place]   . Anxiety   . Aortic atherosclerosis ([place] 10/24/2016  . Breast cancer ([place]   . Chemotherapy-induced neuropathy ([place]   . Essential hypertension   . GERD (gastroesophageal reflux disease)   . Hypokalemia   . Hypomagnesemia   . Invasive ductal carcinoma of right breast ([place]   . Metabolic acidosis   . Personal history of chemotherapy   .  Pneumonia   . Severe protein-calorie malnutrition ([place]   . Thrombocytopenia ([place]     PAST SURGICAL HISTORY:  Past Surgical History:  Procedure Laterality Date  . BREAST BIOPSY Right 2022  . FOOT SURGERY Left    little toe correction  . MASTECTOMY W/ SENTINEL NODE BIOPSY Right 10/19/2021   Procedure: MASTECTOMY WITH SENTINEL LYMPH NODE BIOPSY ( modified radical);  Surgeon: CHerbert Pun MD;  Location: [facility] ORS;  Service: General;  Laterality: Right;  . PORTA CATH INSERTION  01/19/2021    HEMATOLOGY/ONCOLOGY HISTORY:  Oncology History  Invasive ductal carcinoma of right breast ([place]  11/11/2020 Genetic Testing   [facility]  Per note, negative.   02/20/2021 Initial Diagnosis   Invasive ductal carcinoma of right breast (HCC)\  -August 2022, patient was diagnosed with right breast stage IIIb cT3 N0M0, grade 2, ER negative, PR negative HER2 negative breast cancer.  Patient self palpated breast mass many years ago.  10/21/2020 diagnostic mammogram and ultrasound confirmed presence of mass and a subsequent biopsy revealed right breast triple negative cancer. There was plan for patient to start with neoadjuvant chemotherapy followed by surgery and radiation.  Unfortunately patient never started treatment.    [facility]  Established care with me on 02/19/2021    02/24/2021 Imaging   CT chest abdomen pelvis showed large right breast mass, 5.5 x 4.8 cm with small right axillary lymph node with variable degrees of enhancement potentially involved but not specific based on size.  This tracked down to retropectoral nodal stations largest lymph node along the lateral margin of  the pectoralis major.  Tiny pulmonary nodules in the chest as discussed.  These are nonspecific but would consider short interval follow-up to assess for any changes.  No evidence of metastatic disease involving the abdomen or pelvis.  Aortic atherosclerosis.   02/25/2021  Echocardiogram   echocardiogram showed LVEF 65 to 70%.  Patient has been to chemotherapy class and understands antiemetics instructions   03/11/2021 - 08/26/2021 Chemotherapy   BREAST Pembrolizumab (200) D1 + Carboplatin (5) D1 + Paclitaxel (80) D1,8,15 q21d X 4 cycles, followed by Pembrolizumab (200) D1 + AC D1 q21d x 3 cycles. 4th cycle was omitted due to poor tolerance.     03/16/2021 Imaging   bone scan showed no evidence of osseous metastatic breast cancer, asymmetric osseous uptake on the right at L5-S1, corresponding to degenerative disc disease on recent CT.  Asymmetric soft tissue uptake in the right breast   07/13/2021 Echocardiogram   Echocardiogram showed LVEF 60-65%.  Grade 1 diastolic dysfunction.  Refer details to echocardiogram reports.   10/19/2021 Surgery   Patient underwent right modified radical mastectomy  Pathology showed invasive mammary carcinoma, no special type, multifocal, with focal chondroid stroma.  Clip and biopsy sites identified.  12 lymph nodes negative for malignancy.  2 lymph nodes displaying dense fibrous scarring, compatible with pathological complete response.  Benign nipple/areola.   Grade 3, LVI not identified, DCIS not identified.  All margins negative for invasive carcinoma.   ypT2 ypN0  Comment Due to the presence of dense fibrosis and treatment effect in two of the sampled lymph nodes, pancytokeratin stains were performed on all submitted lymph nodes.  All lymph nodes are negative for residual metastatic carcinoma.  A separate focus of invasive carcinoma, measuring approximately 11 mm in greatest extent, and histologically similar to the primary tumor, is identified within sampling of the fibrous tissue adjacent to the primary tumor.    10/19/2021 Cancer Staging   Staging form: Breast, AJCC 8th Edition - Pathologic stage from 10/19/2021: No Stage Recommended (ypT2, pN0, cM0, G3, ER-, PR-, HER2-) - Signed by Earlie Server, MD on 11/03/2021 Stage prefix:  Post-therapy Response to neoadjuvant therapy: Partial response Multigene prognostic tests performed: None Histologic grading system: 3 grade system     ALLERGIES:  has No Known Allergies.  MEDICATIONS:  Current Outpatient Medications  Medication Sig Dispense Refill  . calcium carbonate (TUMS EX) 750 MG chewable tablet Chew 2 tablets by mouth 4 (four) times daily as needed for heartburn.    . calcium-vitamin D (OSCAL WITH D) 500-5 MG-MCG tablet Take 2 tablets by mouth daily. 60 tablet 5  . dexamethasone (DECADRON) 4 MG tablet TAKE 2 TABLETS BY MOUTH ONCE DAILY FOR 2 DAYS AFTER  CHEMOTHERAPY.  TAKE  WITH  FOOD. (Patient not taking: Reported on 10/14/2021) 15 tablet 0  . folic acid (FOLVITE) 1 MG tablet Take 1 tablet (1 mg total) by mouth daily. (Patient not taking: Reported on 11/02/2021) 60 tablet 0  . HYDROcodone-acetaminophen (NORCO/VICODIN) 5-325 MG tablet Take 1 tablet by mouth every 4 (four) hours as needed for moderate pain. 20 tablet 0  . ibuprofen (ADVIL) 200 MG tablet Take 200-400 mg by mouth every 6 (six) hours as needed for headache. (Patient not taking: Reported on 11/02/2021)    . lidocaine-prilocaine (EMLA) cream Apply to affected area once 30 g 3  . LORazepam (ATIVAN) 0.5 MG tablet Take 1 tablet (0.5 mg total) by mouth every 12 (twelve) hours as needed for anxiety or sleep (or nausea). 30 tablet 0  .  magic mouthwash (multi-ingredient) oral suspension Take 5 mLs by mouth 4 (four) times daily as needed for mouth pain. (Patient not taking: Reported on 11/02/2021) 480 mL 1  . magnesium chloride (SLOW-MAG) 64 MG TBEC SR tablet Take 1 tablet (64 mg total) by mouth 2 (two) times daily. 60 tablet 2  . methocarbamol (ROBAXIN) 500 MG tablet Take 1 tablet (500 mg total) by mouth every 8 (eight) hours as needed for muscle spasms. 30 tablet 0  . neomycin-bacitracin-polymyxin (NEOSPORIN) OINT Apply 1 Application topically 2 (two) times daily. (Patient taking differently: Apply 1 Application  topically 2 (two) times daily. Nose sore) 1 g 0  . nicotine (NICODERM CQ - DOSED IN MG/24 HR) 7 mg/24hr patch Place 7 mg onto the skin daily as needed. (Patient not taking: Reported on 11/02/2021)    . nystatin (MYCOSTATIN) 100000 UNIT/ML suspension Take 5 mLs (500,000 Units total) by mouth 4 (four) times daily. 60 mL 0  . omeprazole (PRILOSEC) 20 MG capsule Take 20 mg by mouth 2 (two) times daily before a meal.    . ondansetron (ZOFRAN) 8 MG tablet TAKE 1 TABLET BY MOUTH TWICE DAILY AS NEEDED -  START  THE  3RD  DAY  AFTER  CARBOPLATIN  AND  AC  CHEMOTHERAPY 90 tablet 0  . potassium chloride SA (KLOR-CON M) 20 MEQ tablet Take 1 tablet (20 mEq total) by mouth daily. 90 tablet 0  . sucralfate (CARAFATE) 1 g tablet Take 1 tablet (1 g total) by mouth 4 (four) times daily -  with meals and at bedtime. 120 tablet 0  . VENTOLIN HFA 108 (90 Base) MCG/ACT inhaler Inhale 1-2 puffs into the lungs every 6 (six) hours as needed for wheezing or shortness of breath.     No current facility-administered medications for this visit.   Facility-Administered Medications Ordered in Other Visits  Medication Dose Route Frequency Provider Last Rate Last Admin  . famotidine (PEPCID) 20-0.9 MG/50ML-% IVPB           . heparin lock flush 100 unit/mL  500 Units Intravenous Once Alekzander Cardell, Kirt Boys, NP        VITAL SIGNS: BP 133/84   Pulse (!) 102   Temp 99.4 F (37.4 C) (Tympanic)   Resp 16   LMP 11/19/2002 (Approximate)  There were no vitals filed for this visit.  Estimated body mass index is 17.43 kg/m as calculated from the following:   Height as of 10/19/21: '5\' 2"'  (1.575 m).   Weight as of 11/02/21: 95 lb 4.8 oz (43.2 kg).  LABS: CBC:    Component Value Date/Time   WBC 6.2 01/05/2022 1341   HGB 13.5 01/05/2022 1341   HGB 12.7 02/17/2014 0323   HCT 40.1 01/05/2022 1341   HCT 37.7 02/17/2014 0323   PLT 138 (L) 01/05/2022 1341   PLT 156 02/17/2014 0323   MCV 92.0 01/05/2022 1341   MCV 100 02/17/2014 0323    NEUTROABS 7.8 (H) 09/15/2021 0843   NEUTROABS 5.9 02/17/2014 0323   LYMPHSABS 2.6 09/15/2021 0843   LYMPHSABS 3.2 02/17/2014 0323   MONOABS 1.4 (H) 09/15/2021 0843   MONOABS 0.4 02/17/2014 0323   EOSABS 0.0 09/15/2021 0843   EOSABS 0.0 02/17/2014 0323   BASOSABS 0.1 09/15/2021 0843   BASOSABS 0.1 02/17/2014 0323   Comprehensive Metabolic Panel:    Component Value Date/Time   NA 137 01/05/2022 1341   NA 134 (L) 02/17/2014 0323   K 3.7 01/05/2022 1341   K 3.3 (L) 02/17/2014  1113   CL 108 01/05/2022 1341   CL 100 02/17/2014 0323   CO2 22 01/05/2022 1341   CO2 26 02/17/2014 0323   BUN 7 (L) 01/05/2022 1341   BUN 6 (L) 02/17/2014 0323   CREATININE 0.60 01/05/2022 1341   CREATININE 0.77 01/27/2017 1418   GLUCOSE 122 (H) 01/05/2022 1341   GLUCOSE 108 (H) 02/17/2014 0323   CALCIUM 8.7 (L) 01/05/2022 1341   CALCIUM 7.9 (L) 02/17/2014 0323   AST 21 01/05/2022 1341   AST 41 (H) 02/17/2014 0323   ALT 13 01/05/2022 1341   ALT 36 02/17/2014 0323   ALKPHOS 67 01/05/2022 1341   ALKPHOS 47 02/17/2014 0323   BILITOT 0.5 01/05/2022 1341   BILITOT 0.8 02/17/2014 0323   PROT 6.8 01/05/2022 1341   PROT 5.9 (L) 02/17/2014 0323   ALBUMIN 3.9 01/05/2022 1341   ALBUMIN 3.0 (L) 02/17/2014 0323    RADIOGRAPHIC STUDIES: No results found.  PERFORMANCE STATUS (ECOG) : 1 - Symptomatic but completely ambulatory  Review of Systems Unless otherwise noted, a complete review of systems is negative.  Physical Exam General: NAD Cardiovascular: regular rate and rhythm Pulmonary: clear ant fields Abdomen: soft, nontender, + bowel sounds GU: no suprapubic tenderness Extremities: no edema, no joint deformities Skin: no rashes Neurological: Weakness but otherwise nonfocal  IMPRESSION/PLAN: Fatigue -I suspect that this is secondary to depression.  However, will check additional labs including TSH, B12, folate, and vitamin D, in addition to tumor markers.  We will proceed with IV fluids.  Patient  requests dexamethasone as she received that during chemotherapy and found that to dramatically improve her fatigue.  However, I recommended initiation of an antidepressant as I feel that would be more clinically indicated at this point.  We will start sertraline in light of previous QTc prolongation.  Follow-up EKG when patient returns to clinic.  Patient was in agreement.  We will also send referral to LCSW for counseling support.  Follow-up next month with Dr. Tasia Catchings  Addendum: labs show deficiency in B12, folate, and vit D. Discussed with Dr. Tasia Catchings and will start supplementation.    Patient expressed understanding and was in agreement with this plan. She also understands that She can call clinic at any time with any questions, concerns, or complaints.   Thank you for allowing me to participate in the care of this very pleasant patient.   Time Total: 20 minutes  Visit consisted of counseling and education dealing with the complex and emotionally intense issues of symptom management in the setting of serious illness.Greater than 50%  of this time was spent counseling and coordinating care related to the above assessment and plan.  Signed by: Altha Harm, PhD, NP-C

## 2022-01-06 ENCOUNTER — Inpatient Hospital Stay: Payer: Medicaid Other | Admitting: Licensed Clinical Social Worker

## 2022-01-06 ENCOUNTER — Other Ambulatory Visit: Payer: Self-pay | Admitting: Oncology

## 2022-01-06 DIAGNOSIS — E538 Deficiency of other specified B group vitamins: Secondary | ICD-10-CM

## 2022-01-06 DIAGNOSIS — C50011 Malignant neoplasm of nipple and areola, right female breast: Secondary | ICD-10-CM

## 2022-01-06 LAB — CANCER ANTIGEN 27.29: CA 27.29: 12.2 U/mL (ref 0.0–38.6)

## 2022-01-06 LAB — CANCER ANTIGEN 15-3: CA 15-3: 12.9 U/mL (ref 0.0–25.0)

## 2022-01-06 MED ORDER — FOLIC ACID 1 MG PO TABS: 1.0000 mg | ORAL_TABLET | Freq: Every day | ORAL | 0 refills | Status: DC

## 2022-01-06 MED ORDER — ERGOCALCIFEROL 1.25 MG (50000 UT) PO CAPS: 50000.0000 [IU] | ORAL_CAPSULE | ORAL | 0 refills | Status: DC

## 2022-01-06 NOTE — Addendum Note (Signed)
Addended by: Altha Harm R on: 01/06/2022 10:19 AM   Modules accepted: Orders

## 2022-01-06 NOTE — Progress Notes (Signed)
[facility]  Initial Assessment   Yvonne Scott is a 61 y.o. year old female contacted by phone. Clinical Social Work was referred by medical provider for assessment of psychosocial needs.   SDOH (Social Determinants of Health) assessments performed: Yes SDOH Interventions    Flowsheet Row Clinical Support from 01/06/2022 in [place] at [place] Interventions   Food Insecurity Interventions Intervention Not Indicated  Housing Interventions Intervention Not Indicated  Transportation Interventions Intervention Not Indicated, Patient Resources (Friends/Family)  Utilities Interventions Intervention Not Indicated  Alcohol Usage Interventions Intervention Not Indicated (Score <7)  Depression Interventions/Treatment  Counseling  Financial Strain Interventions Intervention Not Indicated  Physical Activity Interventions Intervention Not Indicated  Stress Interventions Intervention Not Indicated  Social Connections Interventions Intervention Not Indicated       SDOH Screenings   Food Insecurity: No Food Insecurity (01/06/2022)  Housing: Low Risk  (01/06/2022)  Transportation Needs: No Transportation Needs (01/06/2022)  Utilities: Not At Risk (01/06/2022)  Alcohol Screen: Low Risk  (01/06/2022)  Depression (PHQ2-9): Medium Risk (01/06/2022)  Financial Resource Strain: Low Risk  (01/06/2022)  Physical Activity: Inactive (01/06/2022)  Social Connections: Moderately Isolated (01/06/2022)  Stress: Stress Concern Present (01/06/2022)  Tobacco Use: High Risk (11/24/2021)     Distress Screen completed: No    02/19/2021    9:44 AM  ONCBCN DISTRESS SCREENING  Screening Type Initial Screening  Distress experienced in past week (1-10) 3      Family/Social Information:  Housing Arrangement: patient lives alone Carron Brazen  [phone] Family members/support persons in your life? Family, Friends, and Geophysical data processor  concerns: no  Employment: Retired and Disabled  .  Income source: Banker concerns:  No financial concerns identified Type of concern: None Food access concerns: no Religious or spiritual practice: Yes-  Services Currently in place:  Medicaid, SS Disability  Coping/ Adjustment to diagnosis: Patient understands treatment plan and what happens next? yes Concerns about diagnosis and/or treatment: How will I care for myself and Quality of life Patient reported stressors: Depression, Anxiety/ nervousness, Adjusting to my illness, Feeling hopeless, and adjustment to sister's recent passing. Hopes and/or priorities: N/A Patient enjoys  N/A Current coping skills/ strengths: Average or above average intelligence , Capable of independent living , Communication skills , Scientist, research (life sciences) , General fund of knowledge , Motivation for treatment/growth , Physical Health , and Supportive family/friends     SUMMARY: Current SDOH Barriers:  Limited social support and Social Isolation  Clinical Social Work Clinical Goal(s):  No clinical social work goals at this time  Interventions: Discussed common feeling and emotions when being diagnosed with cancer, and the importance of support during treatment Informed patient of the support team roles and support services at [facility] Provided [place] contact information and encouraged patient to call with any questions or concerns Provided patient with information about CSW role in patient care and other available resources.   Follow Up Plan: Patient will contact CSW with any support or resource needs and patient stated she was not interested in counseling at this moment, but has CSW's contact information if she would like to schedule an appointment. Patient verbalizes understanding of plan: Yes    [employer], LCSW

## 2022-01-10 ENCOUNTER — Telehealth: Payer: Self-pay | Admitting: *Deleted

## 2022-01-10 LAB — FOLATE RBC
Folate, Hemolysate: 240 ng/mL
Folate, RBC: 603 ng/mL (ref >498)
Hematocrit: 39.8 % (ref 34.0–46.6)

## 2022-01-10 NOTE — Telephone Encounter (Signed)
Patient called asking if lab results are back stating that she feels "it is more than just depression" CBC Order: [id number] Status: Final result    Visible to patient: No (inaccessible in [place])    Next appt: 02/09/2022 at 01:15 PM in Oncology ([facility])    Dx: Invasive ductal carcinoma of right br...    0 Result Notes           Component Ref Range & Units 5 d ago (01/05/22) 3 mo ago (09/15/21) 4 mo ago (09/07/21) 4 mo ago (09/06/21) 4 mo ago (09/05/21) 4 mo ago (09/05/21) 4 mo ago (09/04/21)  WBC 4.0 - 10.5 K/uL 6.2  12.4 High   9.1  8.9   3.7 Low   2.8 Low    RBC 3.87 - 5.11 MIL/uL 4.36  3.66 Low   4.10  3.62 Low    1.71 Low   2.31 Low    Hemoglobin 12.0 - 15.0 g/dL 13.5  11.2 Low   12.0  10.8 Low   10.5 Low   5.5 Low   7.3 Low    HCT 36.0 - 46.0 % 40.1  33.7 Low   35.5 Low   31.2 Low   31.0 Low  CM  16.5 Low   21.9 Low    MCV 80.0 - 100.0 fL 92.0  92.1  86.6  86.2 CM   96.5  94.8   MCH 26.0 - 34.0 pg 31.0  30.6  29.3  29.8   32.2  31.6   MCHC 30.0 - 36.0 g/dL 33.7  33.2  33.8  34.6   33.3  33.3   RDW 11.5 - 15.5 % 14.4  17.6 High   18.0 High   18.1 High    14.3  14.4   Platelets 150 - 400 K/uL 138 Low   313  41 Low  CM  28 Low Panic  CM   23 Low Panic  CM  6 Low Panic  CM   nRBC 0.0 - 0.2 % 0.0  0.0  0.0 CM  0.2 CM   0.0 CM  0.0 CM   Comment: Performed at [facility], [address] [place], [place] [address]  Neutrophils Relative %   62 R        Basophils Absolute   0.1 R        Immature Granulocytes   5 R        Abs Immature Granulocytes   0.56 High  R, CM        Neutro Abs   7.8 High  R        Lymphocytes Relative   21 R        Lymphs Abs   2.6 R        Monocytes Relative   11 R        Monocytes Absolute   1.4 High  R        Eosinophils Relative   0 R        Eosinophils Absolute   0.0 R        Basophils Relative   1 R        Resulting Agency  [facility] [place] [facility] [place] [facility] [place] [facility] [place] [facility] [place] [facility] [place] [facility]         Specimen Collected:  01/05/22 13:41 Last Resulted: 01/05/22 14:01      Lab Flowsheet    Order Details    View  Encounter    Lab and Collection Details    Routing    Result History    View All Conversations on this Encounter      CM=Additional comments  R=Reference range differs from displayed range      Result Care Coordination   Patient Communication   Add Comments   Not seen Back to Top       Other Results from 01/05/2022  Folate RBC Order: [id number] Status: In process    Visible to patient: No (not released)    Next appt: 02/09/2022 at 01:15 PM in Oncology ([facility])    Dx: Invasive ductal carcinoma of right br...    0 Result Notes     Specimen Collected: 01/05/22 15:09 Last Resulted: 01/05/22 16:30     Order Details    View Encounter    Lab and Collection Details    Routing    Result History    View All Conversations on this Encounter        Result Care Coordination   Patient Communication   Not Released  Not seen Back to Top          Contains abnormal data Vitamin D 25 hydroxy Order: [id number] Status: Final result    Visible to patient: No (inaccessible in [place])    Next appt: 02/09/2022 at 01:15 PM in Oncology ([facility])    Dx: Invasive ductal carcinoma of right br...    0 Result Notes     Component Ref Range & Units 5 d ago  Vit D, 25-Hydroxy 30 - 100 ng/mL 21.50 Low    Comment: (NOTE)  Vitamin D deficiency has been defined by the [place] practice guideline as a level of serum 25-OH  vitamin D less than 20 ng/mL (1,2). The Endocrine Society went on to  further define vitamin D insufficiency as a level between 21 and 29  ng/mL (2).   1. IOM (Institute of Medicine). 2010. Dietary reference intakes for  calcium and D. [place]: The [employer].  2. Holick MF, Binkley [place], Bischoff-Ferrari HA, et al. Evaluation,  treatment, and prevention of vitamin D deficiency: an Endocrine  Society clinical  practice guideline, JCEM. 2011 Jul; 96(7): 1911-30.   Performed at [facility], [place] [address], [place], [place]  [address]   Resulting Agency  [facility]         Specimen Collected: 01/05/22 15:09 Last Resulted: 01/05/22 20:23      Lab Flowsheet    Order Details    View Encounter    Lab and Collection Details    Routing    Result History    View All Conversations on this Encounter        Result Care Coordination   Patient Communication   Add Comments   Not seen Back to Top          Contains abnormal data Folate Order: [id number] Status: Final result    Visible to patient: No (inaccessible in [place])    Next appt: 02/09/2022 at 01:15 PM in Oncology ([facility])    Dx: Invasive ductal carcinoma of right br...    0 Result Notes       Component Ref Range & Units 5 d ago 4 mo ago 5 yr ago  Folate >5.9 ng/mL 5.5 Low   3.5 Low  CM  7.8 R, CM   Comment: Performed at [facility], [address] [place],  [place] [address]  Resulting Agency  [facility] [place] [facility] [place]         Specimen Collected: 01/05/22 15:09 Last Resulted: 01/05/22 17:23      Lab Flowsheet    Order Details    View Encounter    Lab and Collection Details    Routing    Result History    View All Conversations on this Encounter      CM=Additional comments  R=Reference range differs from displayed range      Result Care Coordination   Patient Communication   Add Comments   Not seen Back to Top          Contains abnormal data Vitamin B12 Order: [id number] Status: Final result    Visible to patient: No (inaccessible in [place])    Next appt: 02/09/2022 at 01:15 PM in Oncology ([facility])    Dx: Invasive ductal carcinoma of right br...    0 Result Notes       Component Ref Range & Units 5 d ago 4 mo ago 5 yr ago  Vitamin B-12 180 - 914 pg/mL 123 Low   2,341 High  CM  300 R   Comment: (NOTE)  This assay is not validated for testing neonatal or   myeloproliferative syndrome specimens for Vitamin B12 levels.  Performed at [facility], [place] [address] [place], [place]  [address]   Resulting Agency  [facility] [place]         Specimen Collected: 01/05/22 15:09 Last Resulted: 01/05/22 20:27      Lab Flowsheet    Order Details    View Encounter    Lab and Collection Details    Routing    Result History    View All Conversations on this Encounter      CM=Additional comments  R=Reference range differs from displayed range      Result Care Coordination   Patient Communication   Add Comments   Not seen Back to Top         TSH Order: [id number] Status: Final result    Visible to patient: No (inaccessible in [place])    Next appt: 02/09/2022 at 01:15 PM in Oncology ([facility])    Dx: Invasive ductal carcinoma of right br...    0 Result Notes           Component Ref Range & Units 5 d ago 3 mo ago 5 mo ago 7 mo ago 8 mo ago 9 mo ago 10 mo ago  TSH 0.350 - 4.500 uIU/mL 3.335  1.594 CM  1.158 CM  4.902 High  CM  4.843 High  CM  5.371 High  CM  2.514 CM   Comment: Performed by a 3rd Generation assay with a functional sensitivity of <=0.01 uIU/mL.  Performed at [facility], [address] [place],  [address]   Resulting Agency  [facility] [place] [facility] [place] [facility] [place] [facility] [place] [facility] [place] [facility]         Specimen Collected: 01/05/22 15:09 Last Resulted: 01/05/22 17:23      Lab Flowsheet    Order Details    View Encounter    Lab and Collection Details    Routing    Result History    View All Conversations on this Encounter      CM=Additional comments      Result Care Coordination   Patient  Communication   Add Comments   Not seen Back to Top         Cancer antigen 27.29 Order: [id number] Status: Final result    Visible to patient: No (inaccessible in MyChart)    Next appt: 02/09/2022 at 01:15 PM in Oncology ([facility])    Dx: Invasive ductal  carcinoma of right br...    0 Result Notes      Component Ref Range & Units 5 d ago 10 mo ago  CA 27.29 0.0 - 38.6 U/mL 12.2  38.8 High  CM   Comment: (NOTE)  [employer] Immunochemiluminometric Methodology (ICMA)  Values obtained with different assay methods or kits cannot be used  interchangeably. Results cannot be interpreted as absolute evidence  of the presence or absence of malignant disease.  Performed At: [facility]  [address] [place], [place] [id number]  Rush Farmer MD BH:[id number]   Resulting Agency  [facility]         Specimen Collected: 01/05/22 15:09 Last Resulted: 01/06/22 06:37      Lab Flowsheet    Order Details    View Encounter    Lab and Collection Details    Routing    Result History    View All Conversations on this Encounter      CM=Additional comments      Result Care Coordination   Patient Communication   Add Comments   Not seen Back to Top         Cancer antigen 15-3 Order: [id number] Status: Final result    Visible to patient: No (inaccessible in [place])    Next appt: 02/09/2022 at 01:15 PM in Oncology ([facility])    Dx: Invasive ductal carcinoma of right br...    0 Result Notes      Component Ref Range & Units 5 d ago 10 mo ago  CA 15-3 0.0 - 25.0 U/mL 12.9  26.5 High  CM   Comment: (NOTE)  Roche Diagnostics Electrochemiluminescence Immunoassay (ECLIA)  Values obtained with different assay methods or kits cannot be  used interchangeably.  Results cannot be interpreted as absolute  evidence of the presence or absence of malignant disease.  Performed At: [facility]  [address] [place], [place] [id number]  Rush Farmer MD QI:[id number]   Resulting Agency  [facility]         Specimen Collected: 01/05/22 15:09 Last Resulted: 01/06/22 10:36      Lab Flowsheet    Order Details    View Encounter    Lab and Collection Details    Routing    Result History    View  All Conversations on this Encounter      CM=Additional comments      Result Care Coordination   Patient Communication   Add Comments   Not seen Back to Top          Contains abnormal data Comprehensive metabolic panel Order: [id number] Status: Final result    Visible to patient: No (inaccessible in MyChart)    Next appt: 02/09/2022 at 01:15 PM in Oncology ([facility])    Dx: Invasive ductal carcinoma of right br...    0 Result Notes   1 HM Topic           Component Ref Range & Units 5 d ago (01/05/22) 3 mo ago (09/15/21) 4 mo ago (09/08/21) 4 mo ago (09/06/21) 4 mo ago (09/05/21) 4 mo ago (09/05/21)  4 mo ago (09/04/21)  Sodium 135 - 145 mmol/L 137  137   136   136 CM  132 Low    Potassium 3.5 - 5.1 mmol/L 3.7  3.6  3.4 Low  CM  3.6  4.3 CM  2.6 Low Panic  CM  3.0 Low    Chloride 98 - 111 mmol/L 108  101   110   117 High   100   CO2 22 - 32 mmol/L '22  27   22   16 '$ Low   20 Low    Glucose, Bld 70 - 99 mg/dL 122 High   82 CM   88 CM   78 CM  111 High  CM   Comment: Glucose reference range applies only to samples taken after fasting for at least 8 hours.  BUN 8 - 23 mg/dL 7 Low   8   <5 Low    8  22   Creatinine, Ser 0.44 - 1.00 mg/dL 0.60  0.79   0.40 Low    0.34 Low   0.65   Calcium 8.9 - 10.3 mg/dL 8.7 Low   9.2   8.5 Low    6.4 Low Panic  CM  8.6 Low    Total Protein 6.5 - 8.1 g/dL 6.8  6.4 Low       6.3 Low    Albumin 3.5 - 5.0 g/dL 3.9  3.5      3.2 Low    AST 15 - 41 U/L '21  19      15   '$ ALT 0 - 44 U/L '13  12      13   '$ Alkaline Phosphatase 38 - 126 U/L 67  65      60   Total Bilirubin 0.3 - 1.2 mg/dL 0.5  0.5      0.7   GFR, Estimated >60 mL/min >60  >60 CM   >60 CM   >60 CM  >60 CM   Comment: (NOTE)  Calculated using the CKD-EPI Creatinine Equation (2021)   Anion gap 5 - '15 7  9 '$ CM   4 Low  CM   3 Low  CM  12 CM   Comment: Performed at [facility], [address] [place], [place] [address]  Resulting Agency  [facility] [place] [facility] [place] [facility] [place] [facility] [place]  [facility] [place] [facility] [place] [facility]         Specimen Collected: 01/05/22 13:41 Last Resulted: 01/05/22 14:02

## 2022-01-11 ENCOUNTER — Encounter: Payer: Self-pay | Admitting: Oncology

## 2022-01-11 NOTE — Telephone Encounter (Signed)
I spoke with patient to clarify her concerns and discuss lab results.  Yvonne Scott - please schedule patient for B12 injections weekly x 4 and then monthly.

## 2022-01-18 ENCOUNTER — Inpatient Hospital Stay: Payer: Medicaid Other | Attending: Oncology

## 2022-01-18 ENCOUNTER — Telehealth: Payer: Self-pay

## 2022-01-18 DIAGNOSIS — Z171 Estrogen receptor negative status [ER-]: Secondary | ICD-10-CM | POA: Insufficient documentation

## 2022-01-18 DIAGNOSIS — E538 Deficiency of other specified B group vitamins: Secondary | ICD-10-CM | POA: Diagnosis present

## 2022-01-18 DIAGNOSIS — C50911 Malignant neoplasm of unspecified site of right female breast: Secondary | ICD-10-CM | POA: Insufficient documentation

## 2022-01-18 MED ORDER — CYANOCOBALAMIN 1000 MCG/ML IJ SOLN
1000.0000 ug | Freq: Once | INTRAMUSCULAR | Status: AC
  Administered 2022-01-18: 1000 ug via INTRAMUSCULAR
  Filled 2022-01-18: qty 1

## 2022-01-18 NOTE — Telephone Encounter (Signed)
Patient seen for B12 injection today. Patient has some concerns she would like to discuss. She states her teeth have been falling out since chemo, she has fungus on her toes, and blurred vision. Patient states she hit her toe this morning  and it bled for a long time before she was able to get the bleeding to stop. Informed patient I would send message to Team to give her a call back.

## 2022-01-18 NOTE — Telephone Encounter (Signed)
Please schedule MD only this week or next , per pt availability.

## 2022-01-18 NOTE — Telephone Encounter (Signed)
Please advise 

## 2022-01-19 NOTE — Telephone Encounter (Signed)
Appt reminder sent to pt.

## 2022-01-21 ENCOUNTER — Other Ambulatory Visit: Payer: Self-pay | Admitting: *Deleted

## 2022-01-21 NOTE — Telephone Encounter (Signed)
Patient called reporting that she is having pain up under her arm and she needs refill of her pain medicine from August

## 2022-01-24 ENCOUNTER — Encounter: Payer: Self-pay | Admitting: Oncology

## 2022-01-25 ENCOUNTER — Encounter: Payer: Self-pay | Admitting: Oncology

## 2022-01-25 ENCOUNTER — Inpatient Hospital Stay: Payer: Medicaid Other

## 2022-01-25 ENCOUNTER — Inpatient Hospital Stay (HOSPITAL_BASED_OUTPATIENT_CLINIC_OR_DEPARTMENT_OTHER): Payer: Medicaid Other | Admitting: Oncology

## 2022-01-25 VITALS — BP 126/86 | HR 95 | Temp 98.8°F | Wt 96.6 lb

## 2022-01-25 DIAGNOSIS — R0781 Pleurodynia: Secondary | ICD-10-CM | POA: Diagnosis not present

## 2022-01-25 DIAGNOSIS — E538 Deficiency of other specified B group vitamins: Secondary | ICD-10-CM | POA: Diagnosis not present

## 2022-01-25 DIAGNOSIS — G620 Drug-induced polyneuropathy: Secondary | ICD-10-CM

## 2022-01-25 DIAGNOSIS — Z720 Tobacco use: Secondary | ICD-10-CM

## 2022-01-25 DIAGNOSIS — T451X5A Adverse effect of antineoplastic and immunosuppressive drugs, initial encounter: Secondary | ICD-10-CM

## 2022-01-25 DIAGNOSIS — C50911 Malignant neoplasm of unspecified site of right female breast: Secondary | ICD-10-CM | POA: Diagnosis not present

## 2022-01-25 DIAGNOSIS — E559 Vitamin D deficiency, unspecified: Secondary | ICD-10-CM

## 2022-01-25 DIAGNOSIS — B370 Candidal stomatitis: Secondary | ICD-10-CM

## 2022-01-25 MED ORDER — CYANOCOBALAMIN 1000 MCG/ML IJ SOLN
1000.0000 ug | Freq: Once | INTRAMUSCULAR | Status: AC
  Administered 2022-01-25: 1000 ug via INTRAMUSCULAR
  Filled 2022-01-25: qty 1

## 2022-01-25 MED ORDER — NYSTATIN 100000 UNIT/ML MT SUSP: 5.0000 mL | Freq: Four times a day (QID) | OROMUCOSAL | 0 refills | Status: DC

## 2022-01-25 MED ORDER — HYDROCODONE-ACETAMINOPHEN 5-325 MG PO TABS: 1.0000 | ORAL_TABLET | Freq: Two times a day (BID) | ORAL | 0 refills | Status: DC | PRN

## 2022-01-25 NOTE — Assessment & Plan Note (Signed)
Recommend nystatin mouthwash.  Prescription sent.

## 2022-01-25 NOTE — Assessment & Plan Note (Addendum)
Chest rib xray. Patient reports that she has tried Tylenol and NSAIDs, not helpful.  She requests stronger pain medication. Prescription for 14 tablets of Norco was sent to pharmacy.  She understands that the doctors are controlled substance, and medication can not be shared or sold with others.  Rationale potential side effects were reviewed with patient.

## 2022-01-25 NOTE — Assessment & Plan Note (Addendum)
Clinical stage III triple negative right breast cancer. cT3 cN0-1 S/p  neoadjuvant carboplatin Taxol Keytruda followed by [facility] with Keytruda x 3.   4th cycle of AC with Beryle Flock was not given due to poor tolerance. Status post right mastectomy with sentinel lymph node biopsy. ypT2 ypN0, residual disease, Patient declines any adjuvant chemotherapy or immunotherapy Continue clinical surveillance.

## 2022-01-25 NOTE — Assessment & Plan Note (Signed)
Continue Vitamin B12 injection, weekly x 4 followed by monthly

## 2022-01-25 NOTE — Progress Notes (Signed)
Hematology/Oncology Progress note [phone] [phone]         Patient Care Team: [name], NP as PCP - General (Nurse Practitioner)  REFERRING PROVIDER: [name], NP   ASSESSMENT & PLAN:   Cancer Staging  Invasive ductal carcinoma of right breast [facility]) Staging form: Breast, AJCC 8th Edition - Pathologic stage from 10/19/2021: No Stage Recommended (ypT2, pN0, cM0, G3, ER-, PR-, HER2-) - Signed by Earlie Server, MD on 11/03/2021   Invasive ductal carcinoma of right breast ([place]) Clinical stage III triple negative right breast cancer. cT3 cN0-1 S/p  neoadjuvant carboplatin Taxol Keytruda followed by [facility] with Keytruda x 3.   4th cycle of AC with Beryle Flock was not given due to poor tolerance. Status post right mastectomy with sentinel lymph node biopsy. ypT2 ypN0, residual disease, Patient declines any adjuvant chemotherapy or immunotherapy Continue clinical surveillance.    Tobacco abuse Smoke cessation was discussed with patient. Check CT chest wo contrast, lung cancer screening.   Chemotherapy-induced neuropathy ([place]) Grade 1 Patient was recommended to continue gabapentin 100 mg daily. She is not taking.  Stable symptoms.   Rib pain on right side Chest rib xray. Patient reports that she has tried Tylenol and NSAIDs, not helpful.  She requests stronger pain medication. Prescription for 14 tablets of Norco was sent to pharmacy.  She understands that the doctors are controlled substance, and medication can not be shared or sold with others.  Rationale potential side effects were reviewed with patient.   B12 deficiency Continue Vitamin B12 injection, weekly x 4 followed by monthly  Vitamin D deficiency Recommend vitamin D supplementation.   Thrush Recommend nystatin mouthwash.  Prescription sent.    Orders Placed This Encounter  Procedures   DG Ribs Unilateral Right    Standing Status:   Future    Standing Expiration Date:    01/26/2023    Order Specific Question:   Reason for Exam (SYMPTOM  OR DIAGNOSIS REQUIRED)    Answer:   right rib pain    Order Specific Question:   Preferred imaging location?    Answer:   [facility]   CT CHEST LUNG CA SCREEN LOW DOSE W/O CM    To be done next available    Standing Status:   Future    Standing Expiration Date:   01/26/2023    Order Specific Question:   Preferred Imaging Location?    Answer:   [facility]   CBC with Differential/Platelet    Standing Status:   Future    Standing Expiration Date:   01/26/2023   Comprehensive metabolic panel    Standing Status:   Future    Standing Expiration Date:   01/25/2023   Vitamin B12    Standing Status:   Future    Standing Expiration Date:   01/26/2023   Folate    Standing Status:   Future    Standing Expiration Date:   01/26/2023   Cancer antigen 15-3    Standing Status:   Future    Standing Expiration Date:   01/26/2023   Cancer antigen 27.29    Standing Status:   Future    Standing Expiration Date:   01/26/2023   Follow-up in 3 months.  All questions were answered. The patient knows to call the clinic with any problems, questions or concerns.  Earlie Server, MD, PhD [facility] Hematology Oncology 01/25/2022      CHIEF COMPLAINTS/REASON FOR VISIT:  Follow-up for triple negative right breast  cancer.  HISTORY OF PRESENTING ILLNESS:   Yvonne Scott is a  61 y.o.  female presents for follow-up of triple negative breast cancer. Oncology History  Invasive ductal carcinoma of right breast ([place])  11/11/2020 Genetic Testing   Genetic testing done at [facility].  Per note, negative.   02/20/2021 Initial Diagnosis   Invasive ductal carcinoma of right breast (HCC)\  -August 2022, patient was diagnosed with right breast stage IIIb cT3 N0M0, grade 2, ER negative, PR negative HER2 negative breast cancer.  Patient self palpated breast mass many years ago.  10/21/2020 diagnostic mammogram and ultrasound confirmed  presence of mass and a subsequent biopsy revealed right breast triple negative cancer. There was plan for patient to start with neoadjuvant chemotherapy followed by surgery and radiation.  Unfortunately patient never started treatment.    Medi port was placed at [facility].  Established care with me on 02/19/2021    02/24/2021 Imaging   CT chest abdomen pelvis showed large right breast mass, 5.5 x 4.8 cm with small right axillary lymph node with variable degrees of enhancement potentially involved but not specific based on size.  This tracked down to retropectoral nodal stations largest lymph node along the lateral margin of the pectoralis major.  Tiny pulmonary nodules in the chest as discussed.  These are nonspecific but would consider short interval follow-up to assess for any changes.  No evidence of metastatic disease involving the abdomen or pelvis.  Aortic atherosclerosis.   02/25/2021 Echocardiogram   echocardiogram showed LVEF 65 to 70%.  Patient has been to chemotherapy class and understands antiemetics instructions   03/11/2021 - 08/26/2021 Chemotherapy   BREAST Pembrolizumab (200) D1 + Carboplatin (5) D1 + Paclitaxel (80) D1,8,15 q21d X 4 cycles, followed by Pembrolizumab (200) D1 + AC D1 q21d x 3 cycles. 4th cycle was omitted due to poor tolerance.     03/16/2021 Imaging   bone scan showed no evidence of osseous metastatic breast cancer, asymmetric osseous uptake on the right at L5-S1, corresponding to degenerative disc disease on recent CT.  Asymmetric soft tissue uptake in the right breast   07/13/2021 Echocardiogram   Echocardiogram showed LVEF 60-65%.  Grade 1 diastolic dysfunction.  Refer details to echocardiogram reports.   10/19/2021 Surgery   Patient underwent right modified radical mastectomy  Pathology showed invasive mammary carcinoma, no special type, multifocal, with focal chondroid stroma.  Clip and biopsy sites identified.  12 lymph nodes negative for malignancy.  2 lymph nodes  displaying dense fibrous scarring, compatible with pathological complete response.  Benign nipple/areola.   Grade 3, LVI not identified, DCIS not identified.  All margins negative for invasive carcinoma.   ypT2 ypN0  Comment Due to the presence of dense fibrosis and treatment effect in two of the sampled lymph nodes, pancytokeratin stains were performed on all submitted lymph nodes.  All lymph nodes are negative for residual metastatic carcinoma.  A separate focus of invasive carcinoma, measuring approximately 11 mm in greatest extent, and histologically similar to the primary tumor, is identified within sampling of the fibrous tissue adjacent to the primary tumor.    10/19/2021 Cancer Staging   Staging form: Breast, AJCC 8th Edition - Pathologic stage from 10/19/2021: No Stage Recommended (ypT2, pN0, cM0, G3, ER-, PR-, HER2-) - Signed by Earlie Server, MD on 11/03/2021 Stage prefix: Post-therapy Response to neoadjuvant therapy: Partial response Multigene prognostic tests performed: None Histologic grading system: 3 grade system    07/16/2021-07/19/2021, patient was admitted for HCAP patient was treated  with antibiotics 09/04/2021 - 09/08/2021 Patient was admitted due to pancytopenia, thrush electrolyte abnormality.     INTERVAL HISTORY Yvonne Scott is a 61 y.o. female who has above history reviewed by me today presents for follow up visit for management of triple negative breast cancer During the interval, patient has lost 2 family members [sister and brother]. She has reported severe fatigue and was seen by [facility].  She was found to have vitamin O11 deficiency, folic deficiency and vitamin D deficiency.  Patient has been started on vitamin X72 injections, folic acid supplementation as well as vitamin D supplementation.  Today she feels the fatigue has improved.  She has experienced a right armpit pain after trimming flowers.  Also reports having a "film" on her tongue that she cannot get rid of.   Requesting refill of her pain medication for her pain.     Review of Systems  Constitutional:  Positive for fatigue. Negative for appetite change, chills and fever.  HENT:   Negative for hearing loss, mouth sores and voice change.   Eyes:  Negative for eye problems.  Respiratory:  Negative for chest tightness and cough.        Right-sided chest wall pain  Cardiovascular:  Negative for chest pain.  Gastrointestinal:  Negative for abdominal distention, abdominal pain, blood in stool, diarrhea and nausea.  Endocrine: Negative for hot flashes.  Genitourinary:  Negative for difficulty urinating, dysuria and frequency.   Musculoskeletal:  Negative for arthralgias.  Skin:  Negative for itching and rash.  Neurological:  Positive for numbness. Negative for extremity weakness.  Hematological:  Negative for adenopathy.  Psychiatric/Behavioral:  Negative for confusion. The patient is not nervous/anxious.     MEDICAL HISTORY:  Past Medical History:  Diagnosis Date   Acute kidney injury ([place])    Anxiety    Aortic atherosclerosis ([place]) 10/24/2016   Breast cancer (HCC)    Chemotherapy-induced neuropathy (HCC)    Essential hypertension    GERD (gastroesophageal reflux disease)    Hypokalemia    Hypomagnesemia    Invasive ductal carcinoma of right breast (HCC)    Metabolic acidosis    Personal history of chemotherapy    Pneumonia    Severe protein-calorie malnutrition (HCC)    Thrombocytopenia (HCC)     SURGICAL HISTORY: Past Surgical History:  Procedure Laterality Date   BREAST BIOPSY Right 2022   FOOT SURGERY Left    little toe correction   MASTECTOMY W/ SENTINEL NODE BIOPSY Right 10/19/2021   Procedure: MASTECTOMY WITH SENTINEL LYMPH NODE BIOPSY ( modified radical);  Surgeon: Herbert Pun, MD;  Location: [facility] ORS;  Service: General;  Laterality: Right;   PORTA CATH INSERTION  01/19/2021    SOCIAL HISTORY: Social History   Socioeconomic History   Marital status:  Widowed    Spouse name: Not on file   Number of children: 1   Years of education: Not on file   Highest education level: Not on file  Occupational History   Not on file  Tobacco Use   Smoking status: Every Day    Packs/day: 1.00    Years: 36.00    Total pack years: 36.00    Types: Cigarettes    Start date: 03/15/1979   Smokeless tobacco: Never  Vaping Use   Vaping Use: Never used  Substance and Sexual Activity   Alcohol use: No    Alcohol/week: 3.0 standard drinks of alcohol    Types: 3 Standard drinks or equivalent per week   Drug  use: Not Currently   Sexual activity: Not Currently    Birth control/protection: None  Other Topics Concern   Not on file  Social History Narrative   Lives with mother   Social Determinants of Health   Financial Resource Strain: Low Risk  (01/06/2022)   Overall Financial Resource Strain (CARDIA)    Difficulty of Paying Living Expenses: Not very hard  Food Insecurity: No Food Insecurity (01/06/2022)   Hunger Vital Sign    Worried About Running Out of Food in the Last Year: Never true    Ran Out of Food in the Last Year: Never true  Transportation Needs: No Transportation Needs (01/06/2022)   PRAPARE - Hydrologist (Medical): No    Lack of Transportation (Non-Medical): No  Physical Activity: Inactive (01/06/2022)   Exercise Vital Sign    Days of Exercise per Week: 0 days    Minutes of Exercise per Session: 0 min  Stress: Stress Concern Present (01/06/2022)   [place]    Feeling of Stress : Rather much  Social Connections: Moderately Isolated (01/06/2022)   Social Connection and Isolation Panel [NHANES]    Frequency of Communication with Friends and Family: Once a week    Frequency of Social Gatherings with Friends and Family: Once a week    Attends Religious Services: 1 to 4 times per year    Active Member of [employer] or Organizations: Yes     Attends Archivist Meetings: Never    Marital Status: Widowed  Intimate Partner Violence: Not At Risk (01/06/2022)   Humiliation, Afraid, Rape, and Kick questionnaire    Fear of Current or Ex-Partner: No    Emotionally Abused: No    Physically Abused: No    Sexually Abused: No    FAMILY HISTORY: Family History  Problem Relation Age of Onset   Cancer Mother 10       breast   Cancer Father        melonma   Cirrhosis Maternal Grandfather    Cancer Paternal Grandmother        skin/breast    ALLERGIES:  has No Known Allergies.  MEDICATIONS:  Current Outpatient Medications  Medication Sig Dispense Refill   Calcium Carb-Cholecalciferol (OYSTER SHELL CALCIUM W/D) 500-5 MG-MCG TABS Take by mouth.     calcium carbonate (TUMS EX) 750 MG chewable tablet Chew 2 tablets by mouth 4 (four) times daily as needed for heartburn.     calcium-vitamin D (OSCAL WITH D) 500-5 MG-MCG tablet Take 2 tablets by mouth daily. 60 tablet 5   ergocalciferol (VITAMIN D2) 1.25 MG (50000 UT) capsule Take 1 capsule (50,000 Units total) by mouth once a week. 52 capsule 0   fluticasone (FLONASE) 50 MCG/ACT nasal spray Place 1 spray into both nostrils daily.     folic acid (FOLVITE) 1 MG tablet Take 1 tablet (1 mg total) by mouth daily. 60 tablet 0   lidocaine-prilocaine (EMLA) cream Apply to affected area once 30 g 3   magnesium chloride (SLOW-MAG) 64 MG TBEC SR tablet Take 1 tablet (64 mg total) by mouth 2 (two) times daily. 60 tablet 2   omeprazole (PRILOSEC) 20 MG capsule Take 20 mg by mouth 2 (two) times daily before a meal.     VENTOLIN HFA 108 (90 Base) MCG/ACT inhaler Inhale 1-2 puffs into the lungs every 6 (six) hours as needed for wheezing or shortness of breath.     DULoxetine (  CYMBALTA) 30 MG capsule Take 30 mg by mouth daily. (Patient not taking: Reported on 01/25/2022)     HYDROcodone-acetaminophen (NORCO/VICODIN) 5-325 MG tablet Take 1 tablet by mouth every 12 (twelve) hours as needed for  moderate pain or severe pain. 14 tablet 0   ibuprofen (ADVIL) 200 MG tablet Take 200-400 mg by mouth every 6 (six) hours as needed for headache. (Patient not taking: Reported on 11/02/2021)     LORazepam (ATIVAN) 0.5 MG tablet Take 1 tablet (0.5 mg total) by mouth every 12 (twelve) hours as needed for anxiety or sleep (or nausea). (Patient not taking: Reported on 01/25/2022) 30 tablet 0   magic mouthwash (multi-ingredient) oral suspension Take 5 mLs by mouth 4 (four) times daily as needed for mouth pain. (Patient not taking: Reported on 11/02/2021) 480 mL 1   methocarbamol (ROBAXIN) 500 MG tablet Take 1 tablet (500 mg total) by mouth every 8 (eight) hours as needed for muscle spasms. (Patient not taking: Reported on 01/25/2022) 30 tablet 0   naloxone (NARCAN) nasal spray 4 mg/0.1 mL SMARTSIG:1 Both Nares Daily (Patient not taking: Reported on 01/25/2022)     neomycin-bacitracin-polymyxin (NEOSPORIN) OINT Apply 1 Application topically 2 (two) times daily. (Patient not taking: Reported on 01/25/2022) 1 g 0   nicotine (NICODERM CQ - DOSED IN MG/24 HR) 7 mg/24hr patch Place 7 mg onto the skin daily as needed. (Patient not taking: Reported on 11/02/2021)     nystatin (MYCOSTATIN) 100000 UNIT/ML suspension Take 5 mLs (500,000 Units total) by mouth 4 (four) times daily. 60 mL 0   ondansetron (ZOFRAN) 8 MG tablet TAKE 1 TABLET BY MOUTH TWICE DAILY AS NEEDED -  START  THE  3RD  DAY  AFTER  CARBOPLATIN  AND  AC  CHEMOTHERAPY (Patient not taking: Reported on 01/25/2022) 90 tablet 0   sertraline (ZOLOFT) 25 MG tablet Take 1 tablet (25 mg total) by mouth daily. (Patient not taking: Reported on 01/25/2022) 30 tablet 3   sucralfate (CARAFATE) 1 g tablet Take 1 tablet (1 g total) by mouth 4 (four) times daily -  with meals and at bedtime. (Patient not taking: Reported on 01/25/2022) 120 tablet 0   No current facility-administered medications for this visit.   Facility-Administered Medications Ordered in Other Visits   Medication Dose Route Frequency Provider Last Rate Last Admin   famotidine (PEPCID) 20-0.9 MG/50ML-% IVPB              PHYSICAL EXAMINATION: ECOG PERFORMANCE STATUS: 1 - Symptomatic but completely ambulatory Vitals:   01/25/22 1033  BP: 126/86  Pulse: 95  Temp: 98.8 F (37.1 C)  SpO2: 99%   Filed Weights   01/25/22 1033  Weight: 96 lb 9.6 oz (43.8 kg)    Physical Exam Constitutional:      General: She is not in acute distress. HENT:     Head: Normocephalic and atraumatic.  Eyes:     General: No scleral icterus. Cardiovascular:     Rate and Rhythm: Normal rate and regular rhythm.     Heart sounds: Normal heart sounds.  Pulmonary:     Effort: Pulmonary effort is normal. No respiratory distress.     Breath sounds: No wheezing.  Abdominal:     General: Bowel sounds are normal. There is no distension.     Palpations: Abdomen is soft.  Musculoskeletal:        General: No deformity. Normal range of motion.     Cervical back: Normal range of motion and neck supple.  Skin:    General: Skin is warm and dry.     Findings: No erythema or rash.  Neurological:     Mental Status: She is alert and oriented to person, place, and time. Mental status is at baseline.     Cranial Nerves: No cranial nerve deficit.     Coordination: Coordination normal.  Psychiatric:        Mood and Affect: Mood normal.   Right mastectomy, surgical site heals well. Tender chest wall/ribs   LABORATORY DATA:  I have reviewed the data as listed    Latest Ref Rng & Units 01/05/2022    3:09 PM 01/05/2022    1:41 PM 09/15/2021    8:43 AM  CBC  WBC 4.0 - 10.5 K/uL  6.2  12.4   Hemoglobin 12.0 - 15.0 g/dL  13.5  11.2   Hematocrit 34.0 - 46.6 % 39.8  40.1  33.7   Platelets 150 - 400 K/uL  138  313       Latest Ref Rng & Units 01/05/2022    1:41 PM 09/15/2021    8:43 AM 09/08/2021    5:15 AM  CMP  Glucose 70 - 99 mg/dL 122  82    BUN 8 - 23 mg/dL 7  8    Creatinine 0.44 - 1.00 mg/dL 0.60  0.79     Sodium 135 - 145 mmol/L 137  137    Potassium 3.5 - 5.1 mmol/L 3.7  3.6  3.4   Chloride 98 - 111 mmol/L 108  101    CO2 22 - 32 mmol/L 22  27    Calcium 8.9 - 10.3 mg/dL 8.7  9.2    Total Protein 6.5 - 8.1 g/dL 6.8  6.4    Total Bilirubin 0.3 - 1.2 mg/dL 0.5  0.5    Alkaline Phos 38 - 126 U/L 67  65    AST 15 - 41 U/L 21  19    ALT 0 - 44 U/L 13  12          RADIOGRAPHIC STUDIES: I have personally reviewed the radiological images as listed and agreed with the findings in the report. No results found.

## 2022-01-25 NOTE — Assessment & Plan Note (Addendum)
Grade 1 Patient was recommended to continue gabapentin 100 mg daily. She is not taking.  Stable symptoms.

## 2022-01-25 NOTE — Assessment & Plan Note (Signed)
Recommend vitamin D supplementation.  

## 2022-01-25 NOTE — Assessment & Plan Note (Addendum)
Smoke cessation was discussed with patient. Check CT chest wo contrast, lung cancer screening.

## 2022-01-25 NOTE — Progress Notes (Signed)
Patient is here for follow-up. She states that she has been having bad pain under her right arm for a few days. She said that it started when she was out trimming flowers. She also has been having a "film" on her tongue that she can not get rid of. She is also having what she describes as "fungus" on all of her toenails.  Patient is requesting a refill of her pain medication and her potassium.

## 2022-01-26 ENCOUNTER — Ambulatory Visit
Admission: RE | Admit: 2022-01-26 | Discharge: 2022-01-26 | Disposition: A | Discharge status: Home / Self Care | Payer: Medicaid Other | Attending: Oncology | Admitting: Oncology

## 2022-01-26 ENCOUNTER — Ambulatory Visit
Admission: RE | Admit: 2022-01-26 | Discharge: 2022-01-26 | Disposition: A | Discharge status: Home / Self Care | Payer: Medicaid Other | Source: Ambulatory Visit | Attending: Oncology | Admitting: Oncology

## 2022-01-26 DIAGNOSIS — R0781 Pleurodynia: Secondary | ICD-10-CM

## 2022-02-01 ENCOUNTER — Inpatient Hospital Stay: Payer: Medicaid Other

## 2022-02-01 DIAGNOSIS — C50911 Malignant neoplasm of unspecified site of right female breast: Secondary | ICD-10-CM | POA: Diagnosis not present

## 2022-02-01 DIAGNOSIS — E538 Deficiency of other specified B group vitamins: Secondary | ICD-10-CM

## 2022-02-01 MED ORDER — CYANOCOBALAMIN 1000 MCG/ML IJ SOLN
1000.0000 ug | Freq: Once | INTRAMUSCULAR | Status: AC
  Administered 2022-02-01: 1000 ug via INTRAMUSCULAR
  Filled 2022-02-01: qty 1

## 2022-02-02 ENCOUNTER — Other Ambulatory Visit: Payer: Medicaid Other

## 2022-02-07 ENCOUNTER — Other Ambulatory Visit: Payer: Self-pay

## 2022-02-07 ENCOUNTER — Ambulatory Visit
Admission: RE | Admit: 2022-02-07 | Discharge: 2022-02-07 | Disposition: A | Discharge status: Home / Self Care | Payer: Medicaid Other | Source: Ambulatory Visit | Attending: Oncology | Admitting: Oncology

## 2022-02-07 DIAGNOSIS — C50911 Malignant neoplasm of unspecified site of right female breast: Secondary | ICD-10-CM

## 2022-02-07 DIAGNOSIS — Z720 Tobacco use: Secondary | ICD-10-CM | POA: Insufficient documentation

## 2022-02-08 ENCOUNTER — Inpatient Hospital Stay: Payer: Medicaid Other

## 2022-02-08 DIAGNOSIS — C50911 Malignant neoplasm of unspecified site of right female breast: Secondary | ICD-10-CM | POA: Diagnosis not present

## 2022-02-08 DIAGNOSIS — E538 Deficiency of other specified B group vitamins: Secondary | ICD-10-CM

## 2022-02-08 MED ORDER — CYANOCOBALAMIN 1000 MCG/ML IJ SOLN
1000.0000 ug | Freq: Once | INTRAMUSCULAR | Status: AC
  Administered 2022-02-08: 1000 ug via INTRAMUSCULAR
  Filled 2022-02-08: qty 1

## 2022-02-09 ENCOUNTER — Ambulatory Visit: Payer: Medicaid Other | Admitting: Oncology

## 2022-02-09 ENCOUNTER — Other Ambulatory Visit: Payer: Medicaid Other

## 2022-03-02 ENCOUNTER — Inpatient Hospital Stay: Payer: Medicaid Other | Attending: Oncology | Admitting: Hospice and Palliative Medicine

## 2022-03-02 DIAGNOSIS — E538 Deficiency of other specified B group vitamins: Secondary | ICD-10-CM | POA: Insufficient documentation

## 2022-03-02 DIAGNOSIS — C50911 Malignant neoplasm of unspecified site of right female breast: Secondary | ICD-10-CM | POA: Insufficient documentation

## 2022-03-02 DIAGNOSIS — Z171 Estrogen receptor negative status [ER-]: Secondary | ICD-10-CM | POA: Insufficient documentation

## 2022-03-02 NOTE — Progress Notes (Signed)
Patient was not available to talk. I spoke with her mother. Mother says that patient is doing reasonably well. Will follow up when patient is seen back in clinic.

## 2022-03-11 ENCOUNTER — Inpatient Hospital Stay: Payer: Medicaid Other

## 2022-03-11 DIAGNOSIS — C50911 Malignant neoplasm of unspecified site of right female breast: Secondary | ICD-10-CM | POA: Diagnosis present

## 2022-03-11 DIAGNOSIS — E538 Deficiency of other specified B group vitamins: Secondary | ICD-10-CM | POA: Diagnosis not present

## 2022-03-11 DIAGNOSIS — Z171 Estrogen receptor negative status [ER-]: Secondary | ICD-10-CM | POA: Diagnosis not present

## 2022-03-11 DIAGNOSIS — Z95828 Presence of other vascular implants and grafts: Secondary | ICD-10-CM

## 2022-03-11 LAB — COMPREHENSIVE METABOLIC PANEL
ALT: 9 U/L (ref 0–44)
AST: 18 U/L (ref 15–41)
Albumin: 3.4 g/dL — ABNORMAL LOW (ref 3.5–5.0)
Alkaline Phosphatase: 56 U/L (ref 38–126)
Anion gap: 11 (ref 5–15)
BUN: 10 mg/dL (ref 8–23)
CO2: 22 mmol/L (ref 22–32)
Calcium: 8.6 mg/dL — ABNORMAL LOW (ref 8.9–10.3)
Chloride: 101 mmol/L (ref 98–111)
Creatinine, Ser: 0.62 mg/dL (ref 0.44–1.00)
GFR, Estimated: >60 mL/min (ref >60)
Glucose, Bld: 122 mg/dL — ABNORMAL HIGH (ref 70–99)
Potassium: 3.4 mmol/L — ABNORMAL LOW (ref 3.5–5.1)
Sodium: 134 mmol/L — ABNORMAL LOW (ref 135–145)
Total Bilirubin: 0.3 mg/dL (ref 0.3–1.2)
Total Protein: 6.7 g/dL (ref 6.5–8.1)

## 2022-03-11 LAB — CBC WITH DIFFERENTIAL/PLATELET
Abs Immature Granulocytes: 0.12 10*3/uL — ABNORMAL HIGH (ref 0.00–0.07)
Basophils Absolute: 0 10*3/uL (ref 0.0–0.1)
Basophils Relative: 0 %
Eosinophils Absolute: 0 10*3/uL (ref 0.0–0.5)
Eosinophils Relative: 0 %
HCT: 32.2 % — ABNORMAL LOW (ref 36.0–46.0)
Hemoglobin: 11.2 g/dL — ABNORMAL LOW (ref 12.0–15.0)
Immature Granulocytes: 1 %
Lymphocytes Relative: 6 %
Lymphs Abs: 0.7 10*3/uL (ref 0.7–4.0)
MCH: 31.1 pg (ref 26.0–34.0)
MCHC: 34.8 g/dL (ref 30.0–36.0)
MCV: 89.4 fL (ref 80.0–100.0)
Monocytes Absolute: 0.2 10*3/uL (ref 0.1–1.0)
Monocytes Relative: 1 %
Neutro Abs: 11 10*3/uL — ABNORMAL HIGH (ref 1.7–7.7)
Neutrophils Relative %: 92 %
Platelets: 254 10*3/uL (ref 150–400)
RBC: 3.6 MIL/uL — ABNORMAL LOW (ref 3.87–5.11)
RDW: 14.9 % (ref 11.5–15.5)
WBC: 12 10*3/uL — ABNORMAL HIGH (ref 4.0–10.5)
nRBC: 0 % (ref 0.0–0.2)

## 2022-03-11 MED ORDER — HEPARIN SOD (PORK) LOCK FLUSH 100 UNIT/ML IV SOLN
500.0000 [IU] | Freq: Once | INTRAVENOUS | Status: AC
  Administered 2022-03-11: 500 [IU] via INTRAVENOUS
  Filled 2022-03-11: qty 5

## 2022-03-11 MED ORDER — SODIUM CHLORIDE 0.9% FLUSH
10.0000 mL | Freq: Once | INTRAVENOUS | Status: AC
  Administered 2022-03-11: 10 mL via INTRAVENOUS
  Filled 2022-03-11: qty 10

## 2022-03-11 MED ORDER — CYANOCOBALAMIN 1000 MCG/ML IJ SOLN
1000.0000 ug | Freq: Once | INTRAMUSCULAR | Status: AC
  Administered 2022-03-11: 1000 ug via INTRAMUSCULAR
  Filled 2022-03-11: qty 1

## 2022-03-12 LAB — CANCER ANTIGEN 27.29: CA 27.29: <9 U/mL (ref 0.0–38.6)

## 2022-03-12 LAB — CANCER ANTIGEN 15-3: CA 15-3: 11 U/mL (ref 0.0–25.0)

## 2022-03-22 ENCOUNTER — Inpatient Hospital Stay: Payer: Medicaid Other

## 2022-04-01 ENCOUNTER — Telehealth: Payer: Self-pay

## 2022-04-01 ENCOUNTER — Other Ambulatory Visit: Payer: Self-pay | Admitting: Oncology

## 2022-04-01 ENCOUNTER — Inpatient Hospital Stay: Payer: Medicaid Other | Attending: Oncology

## 2022-04-01 DIAGNOSIS — C50911 Malignant neoplasm of unspecified site of right female breast: Secondary | ICD-10-CM | POA: Diagnosis not present

## 2022-04-01 DIAGNOSIS — Z171 Estrogen receptor negative status [ER-]: Secondary | ICD-10-CM | POA: Insufficient documentation

## 2022-04-01 DIAGNOSIS — E538 Deficiency of other specified B group vitamins: Secondary | ICD-10-CM | POA: Insufficient documentation

## 2022-04-01 MED ORDER — CYANOCOBALAMIN 1000 MCG/ML IJ SOLN
1000.0000 ug | Freq: Once | INTRAMUSCULAR | Status: AC
  Administered 2022-04-01: 1000 ug via INTRAMUSCULAR
  Filled 2022-04-01: qty 1

## 2022-04-01 NOTE — Telephone Encounter (Signed)
Pt showed up as walk-in today wanting to have B12 inj today instead of 1/29. Ok per Dr. Tasia Catchings. B12 injection administer.   During encounter, pt voiced that she was feeling more tired and weak and she didn't know if was side effects from tx (which she has not had since June 2023). MD informed.   She is not currently on active treatment so it is less likely that she is feeling like this due to chemo. Unable to reach pt by phone but detailed VM left on ph advising for her to reach out to PCP for further evaluation of fatigue/ weakness.

## 2022-04-11 ENCOUNTER — Inpatient Hospital Stay: Payer: Medicaid Other

## 2022-04-21 ENCOUNTER — Other Ambulatory Visit: Payer: Self-pay | Admitting: Oncology

## 2022-04-22 ENCOUNTER — Other Ambulatory Visit: Payer: Self-pay | Admitting: Nurse Practitioner

## 2022-04-22 DIAGNOSIS — K219 Gastro-esophageal reflux disease without esophagitis: Secondary | ICD-10-CM

## 2022-04-22 MED ORDER — OMEPRAZOLE 40 MG PO CPDR: 40.0000 mg | DELAYED_RELEASE_CAPSULE | Freq: Every day | ORAL | 3 refills | Status: DC

## 2022-04-27 ENCOUNTER — Other Ambulatory Visit: Payer: Medicaid Other

## 2022-04-27 ENCOUNTER — Inpatient Hospital Stay: Payer: Medicaid Other | Attending: Oncology

## 2022-04-27 DIAGNOSIS — C50911 Malignant neoplasm of unspecified site of right female breast: Secondary | ICD-10-CM | POA: Insufficient documentation

## 2022-04-27 DIAGNOSIS — E538 Deficiency of other specified B group vitamins: Secondary | ICD-10-CM | POA: Diagnosis present

## 2022-04-27 DIAGNOSIS — Z171 Estrogen receptor negative status [ER-]: Secondary | ICD-10-CM | POA: Diagnosis not present

## 2022-04-27 DIAGNOSIS — R0781 Pleurodynia: Secondary | ICD-10-CM

## 2022-04-27 LAB — COMPREHENSIVE METABOLIC PANEL
ALT: 10 U/L (ref 0–44)
AST: 25 U/L (ref 15–41)
Albumin: 4.1 g/dL (ref 3.5–5.0)
Alkaline Phosphatase: 102 U/L (ref 38–126)
Anion gap: 13 (ref 5–15)
BUN: <5 mg/dL — ABNORMAL LOW (ref 8–23)
CO2: 24 mmol/L (ref 22–32)
Calcium: 8.9 mg/dL (ref 8.9–10.3)
Chloride: 97 mmol/L — ABNORMAL LOW (ref 98–111)
Creatinine, Ser: 0.67 mg/dL (ref 0.44–1.00)
GFR, Estimated: >60 mL/min (ref >60)
Glucose, Bld: 108 mg/dL — ABNORMAL HIGH (ref 70–99)
Potassium: 3.6 mmol/L (ref 3.5–5.1)
Sodium: 134 mmol/L — ABNORMAL LOW (ref 135–145)
Total Bilirubin: 1 mg/dL (ref 0.3–1.2)
Total Protein: 7 g/dL (ref 6.5–8.1)

## 2022-04-27 LAB — CBC WITH DIFFERENTIAL/PLATELET
Abs Immature Granulocytes: 0.04 10*3/uL (ref 0.00–0.07)
Basophils Absolute: 0 10*3/uL (ref 0.0–0.1)
Basophils Relative: 0 %
Eosinophils Absolute: 0.2 10*3/uL (ref 0.0–0.5)
Eosinophils Relative: 2 %
HCT: 39.1 % (ref 36.0–46.0)
Hemoglobin: 13.2 g/dL (ref 12.0–15.0)
Immature Granulocytes: 0 %
Lymphocytes Relative: 24 %
Lymphs Abs: 2.5 10*3/uL (ref 0.7–4.0)
MCH: 30.5 pg (ref 26.0–34.0)
MCHC: 33.8 g/dL (ref 30.0–36.0)
MCV: 90.3 fL (ref 80.0–100.0)
Monocytes Absolute: 0.5 10*3/uL (ref 0.1–1.0)
Monocytes Relative: 5 %
Neutro Abs: 6.9 10*3/uL (ref 1.7–7.7)
Neutrophils Relative %: 69 %
Platelets: 199 10*3/uL (ref 150–400)
RBC: 4.33 MIL/uL (ref 3.87–5.11)
RDW: 14.1 % (ref 11.5–15.5)
WBC: 10.1 10*3/uL (ref 4.0–10.5)
nRBC: 0 % (ref 0.0–0.2)

## 2022-04-27 LAB — VITAMIN B12: Vitamin B-12: 225 pg/mL (ref 180–914)

## 2022-04-27 LAB — FOLATE: Folate: 8.1 ng/mL (ref >5.9)

## 2022-04-27 MED ORDER — HEPARIN SOD (PORK) LOCK FLUSH 100 UNIT/ML IV SOLN
500.0000 [IU] | Freq: Once | INTRAVENOUS | Status: AC
  Administered 2022-04-27: 500 [IU] via INTRAVENOUS
  Filled 2022-04-27: qty 5

## 2022-04-28 LAB — CANCER ANTIGEN 27.29: CA 27.29: 19 U/mL (ref 0.0–38.6)

## 2022-04-29 LAB — CANCER ANTIGEN 15-3: CA 15-3: 22 U/mL (ref 0.0–25.0)

## 2022-05-02 ENCOUNTER — Inpatient Hospital Stay: Payer: Medicaid Other

## 2022-05-02 ENCOUNTER — Inpatient Hospital Stay: Payer: Medicaid Other | Admitting: Hospice and Palliative Medicine

## 2022-05-02 ENCOUNTER — Inpatient Hospital Stay: Payer: Medicaid Other | Admitting: Oncology

## 2022-05-11 ENCOUNTER — Inpatient Hospital Stay (HOSPITAL_BASED_OUTPATIENT_CLINIC_OR_DEPARTMENT_OTHER): Payer: Medicaid Other | Admitting: Hospice and Palliative Medicine

## 2022-05-11 ENCOUNTER — Other Ambulatory Visit: Payer: Self-pay | Admitting: Oncology

## 2022-05-11 ENCOUNTER — Encounter: Payer: Self-pay | Admitting: Oncology

## 2022-05-11 ENCOUNTER — Inpatient Hospital Stay: Payer: Medicaid Other

## 2022-05-11 ENCOUNTER — Inpatient Hospital Stay (HOSPITAL_BASED_OUTPATIENT_CLINIC_OR_DEPARTMENT_OTHER): Payer: Medicaid Other | Admitting: Oncology

## 2022-05-11 VITALS — BP 134/80 | HR 94 | Temp 98.4°F | Resp 18 | Wt 101.9 lb

## 2022-05-11 DIAGNOSIS — C50911 Malignant neoplasm of unspecified site of right female breast: Secondary | ICD-10-CM

## 2022-05-11 DIAGNOSIS — G620 Drug-induced polyneuropathy: Secondary | ICD-10-CM

## 2022-05-11 DIAGNOSIS — Z720 Tobacco use: Secondary | ICD-10-CM | POA: Diagnosis not present

## 2022-05-11 DIAGNOSIS — E559 Vitamin D deficiency, unspecified: Secondary | ICD-10-CM

## 2022-05-11 DIAGNOSIS — E538 Deficiency of other specified B group vitamins: Secondary | ICD-10-CM | POA: Diagnosis not present

## 2022-05-11 DIAGNOSIS — T451X5A Adverse effect of antineoplastic and immunosuppressive drugs, initial encounter: Secondary | ICD-10-CM

## 2022-05-11 DIAGNOSIS — Z95828 Presence of other vascular implants and grafts: Secondary | ICD-10-CM

## 2022-05-11 MED ORDER — CYANOCOBALAMIN 1000 MCG/ML IJ SOLN
1000.0000 ug | Freq: Once | INTRAMUSCULAR | Status: AC
  Administered 2022-05-11: 1000 ug via INTRAMUSCULAR
  Filled 2022-05-11: qty 1

## 2022-05-11 NOTE — Assessment & Plan Note (Addendum)
Clinical stage III triple negative right breast cancer. cT3 cN0-1 S/p  neoadjuvant carboplatin Taxol Keytruda followed by [facility] with Keytruda x 3.   4th cycle of AC with Beryle Flock was not given due to poor tolerance. Status post right mastectomy with sentinel lymph node biopsy. ypT2 ypN0, residual disease, Patient declines any adjuvant chemotherapy or immunotherapy Labs are reviewed and discussed with patient.  Continue clinical surveillance.

## 2022-05-11 NOTE — Assessment & Plan Note (Addendum)
B12 improved, B12 injection today and repeat another dose in 4 week.

## 2022-05-11 NOTE — Assessment & Plan Note (Signed)
Patient requests to have medi port discontinued. She understands that she may developed recurrence in the future and may need to have medi port placed again.  Her tumor markers are within normal limits, however, have increased  I will repeat CT chest abdomen pelvis, if negative, she will get medi port removed.

## 2022-05-11 NOTE — Progress Notes (Signed)
[place] at [facility] [phone] [phone]   Name: Yvonne Scott Date: 05/11/2022 MRN: [id number]  DOB: 1960-06-08  Patient Care Team: Evern Bio, NP as PCP - General (Nurse Practitioner)    REASON FOR CONSULTATION: Yvonne Scott is a 62 y.o. female with multiple medical problems including stage III triple negative right breast cancer status post neoadjuvant CarboTaxol Keytruda followed by [facility].  She underwent right mastectomy.  Patient ultimately was undecided about adjuvant treatment.  Patient most recently has been on clinical surveillance.  SOCIAL HISTORY:     reports that she has been smoking cigarettes. She started smoking about 43 years ago. She has a 36.00 pack-year smoking history. She has never used smokeless tobacco. She reports that she does not currently use drugs. She reports that she does not drink alcohol.  Patient lives at home alone.  ADVANCE DIRECTIVES:    CODE STATUS:   PAST MEDICAL HISTORY: Past Medical History:  Diagnosis Date   Acute kidney injury ([place])    Anxiety    Aortic atherosclerosis ([place]) 10/24/2016   Breast cancer (HCC)    Chemotherapy-induced neuropathy (HCC)    Essential hypertension    GERD (gastroesophageal reflux disease)    Hypokalemia    Hypomagnesemia    Invasive ductal carcinoma of right breast (HCC)    Metabolic acidosis    Personal history of chemotherapy    Pneumonia    Severe protein-calorie malnutrition (HCC)    Thrombocytopenia (HCC)     PAST SURGICAL HISTORY:  Past Surgical History:  Procedure Laterality Date   BREAST BIOPSY Right 2022   FOOT SURGERY Left    little toe correction   MASTECTOMY W/ SENTINEL NODE BIOPSY Right 10/19/2021   Procedure: MASTECTOMY WITH SENTINEL LYMPH NODE BIOPSY ( modified radical);  Surgeon: Herbert Pun, MD;  Location: [facility] ORS;  Service: General;  Laterality: Right;   PORTA CATH INSERTION   01/19/2021    HEMATOLOGY/ONCOLOGY HISTORY:  Oncology History  Invasive ductal carcinoma of right breast ([place])  11/11/2020 Genetic Testing   Genetic testing done at [facility].  Per note, negative.   02/20/2021 Initial Diagnosis   Invasive ductal carcinoma of right breast (HCC)\  -August 2022, patient was diagnosed with right breast stage IIIb cT3 N0M0, grade 2, ER negative, PR negative HER2 negative breast cancer.  Patient self palpated breast mass many years ago.  10/21/2020 diagnostic mammogram and ultrasound confirmed presence of mass and a subsequent biopsy revealed right breast triple negative cancer. There was plan for patient to start with neoadjuvant chemotherapy followed by surgery and radiation.  Unfortunately patient never started treatment.    Medi port was placed at [facility]  Established care with me on 02/19/2021    02/24/2021 Imaging   CT chest abdomen pelvis showed large right breast mass, 5.5 x 4.8 cm with small right axillary lymph node with variable degrees of enhancement potentially involved but not specific based on size.  This tracked down to retropectoral nodal stations largest lymph node along the lateral margin of the pectoralis major.  Tiny pulmonary nodules in the chest as discussed.  These are nonspecific but would consider short interval follow-up to assess for any changes.  No evidence of metastatic disease involving the abdomen or pelvis.  Aortic atherosclerosis.   02/25/2021 Echocardiogram   echocardiogram showed LVEF 65 to 70%.  Patient has been to chemotherapy class and understands antiemetics instructions   03/11/2021 - 08/26/2021 Chemotherapy   BREAST Pembrolizumab (  200) D1 + Carboplatin (5) D1 + Paclitaxel (80) D1,8,15 q21d X 4 cycles, followed by Pembrolizumab (200) D1 + AC D1 q21d x 3 cycles. 4th cycle was omitted due to poor tolerance.     03/16/2021 Imaging   bone scan showed no evidence of osseous metastatic breast cancer, asymmetric osseous uptake on the right  at L5-S1, corresponding to degenerative disc disease on recent CT.  Asymmetric soft tissue uptake in the right breast   07/13/2021 Echocardiogram   Echocardiogram showed LVEF 60-65%.  Grade 1 diastolic dysfunction.  Refer details to echocardiogram reports.   10/19/2021 Surgery   Patient underwent right modified radical mastectomy  Pathology showed invasive mammary carcinoma, no special type, multifocal, with focal chondroid stroma.  Clip and biopsy sites identified.  12 lymph nodes negative for malignancy.  2 lymph nodes displaying dense fibrous scarring, compatible with pathological complete response.  Benign nipple/areola.   Grade 3, LVI not identified, DCIS not identified.  All margins negative for invasive carcinoma.   ypT2 ypN0  Comment Due to the presence of dense fibrosis and treatment effect in two of the sampled lymph nodes, pancytokeratin stains were performed on all submitted lymph nodes.  All lymph nodes are negative for residual metastatic carcinoma.  A separate focus of invasive carcinoma, measuring approximately 11 mm in greatest extent, and histologically similar to the primary tumor, is identified within sampling of the fibrous tissue adjacent to the primary tumor.    10/19/2021 Cancer Staging   Staging form: Breast, AJCC 8th Edition - Pathologic stage from 10/19/2021: No Stage Recommended (ypT2, pN0, cM0, G3, ER-, PR-, HER2-) - Signed by Earlie Server, MD on 11/03/2021 Stage prefix: Post-therapy Response to neoadjuvant therapy: Partial response Multigene prognostic tests performed: None Histologic grading system: 3 grade system     ALLERGIES:  has No Known Allergies.  MEDICATIONS:  Current Outpatient Medications  Medication Sig Dispense Refill   Calcium Carb-Cholecalciferol (OYSTER SHELL CALCIUM W/D) 500-5 MG-MCG TABS Take by mouth.     calcium carbonate (TUMS EX) 750 MG chewable tablet Chew 2 tablets by mouth 4 (four) times daily as needed for heartburn.     magnesium chloride  (SLOW-MAG) 64 MG TBEC SR tablet Take 1 tablet (64 mg total) by mouth 2 (two) times daily. 60 tablet 2   omeprazole (PRILOSEC) 40 MG capsule Take 1 capsule (40 mg total) by mouth daily. 30 capsule 3   VENTOLIN HFA 108 (90 Base) MCG/ACT inhaler Inhale 1-2 puffs into the lungs every 6 (six) hours as needed for wheezing or shortness of breath.     calcium-vitamin D (OSCAL WITH D) 500-5 MG-MCG tablet Take 2 tablets by mouth daily. (Patient not taking: Reported on 05/11/2022) 60 tablet 5   DULoxetine (CYMBALTA) 30 MG capsule Take 30 mg by mouth daily. (Patient not taking: Reported on 01/25/2022)     ergocalciferol (VITAMIN D2) 1.25 MG (50000 UT) capsule Take 1 capsule (50,000 Units total) by mouth once a week. (Patient not taking: Reported on 05/11/2022) 52 capsule 0   fluticasone (FLONASE) 50 MCG/ACT nasal spray Place 1 spray into both nostrils daily.     folic acid (FOLVITE) 1 MG tablet Take 1 tablet (1 mg total) by mouth daily. (Patient not taking: Reported on 05/11/2022) 60 tablet 0   HYDROcodone-acetaminophen (NORCO/VICODIN) 5-325 MG tablet Take 1 tablet by mouth every 12 (twelve) hours as needed for moderate pain or severe pain. (Patient not taking: Reported on 05/11/2022) 14 tablet 0   ibuprofen (ADVIL) 200 MG tablet Take  200-400 mg by mouth every 6 (six) hours as needed for headache.     lidocaine-prilocaine (EMLA) cream Apply to affected area once 30 g 3   LORazepam (ATIVAN) 0.5 MG tablet Take 1 tablet (0.5 mg total) by mouth every 12 (twelve) hours as needed for anxiety or sleep (or nausea). (Patient not taking: Reported on 01/25/2022) 30 tablet 0   magic mouthwash (multi-ingredient) oral suspension Take 5 mLs by mouth 4 (four) times daily as needed for mouth pain. (Patient not taking: Reported on 11/02/2021) 480 mL 1   methocarbamol (ROBAXIN) 500 MG tablet Take 1 tablet (500 mg total) by mouth every 8 (eight) hours as needed for muscle spasms. (Patient not taking: Reported on 01/25/2022) 30 tablet 0    naloxone (NARCAN) nasal spray 4 mg/0.1 mL SMARTSIG:1 Both Nares Daily (Patient not taking: Reported on 01/25/2022)     neomycin-bacitracin-polymyxin (NEOSPORIN) OINT Apply 1 Application topically 2 (two) times daily. (Patient not taking: Reported on 01/25/2022) 1 g 0   nicotine (NICODERM CQ - DOSED IN MG/24 HR) 7 mg/24hr patch Place 7 mg onto the skin daily as needed. (Patient not taking: Reported on 11/02/2021)     nystatin (MYCOSTATIN) 100000 UNIT/ML suspension Take 5 mLs (500,000 Units total) by mouth 4 (four) times daily. (Patient not taking: Reported on 05/11/2022) 60 mL 0   omeprazole (PRILOSEC) 20 MG capsule Take 20 mg by mouth 2 (two) times daily before a meal. (Patient not taking: Reported on 05/11/2022)     ondansetron (ZOFRAN) 8 MG tablet TAKE 1 TABLET BY MOUTH TWICE DAILY AS NEEDED -  START  THE  3RD  DAY  AFTER  CARBOPLATIN  AND  AC  CHEMOTHERAPY (Patient not taking: Reported on 01/25/2022) 90 tablet 0   sertraline (ZOLOFT) 25 MG tablet Take 1 tablet (25 mg total) by mouth daily. (Patient not taking: Reported on 01/25/2022) 30 tablet 3   sucralfate (CARAFATE) 1 g tablet Take 1 tablet (1 g total) by mouth 4 (four) times daily -  with meals and at bedtime. (Patient not taking: Reported on 01/25/2022) 120 tablet 0   No current facility-administered medications for this visit.   Facility-Administered Medications Ordered in Other Visits  Medication Dose Route Frequency Provider Last Rate Last Admin   cyanocobalamin (VITAMIN B12) injection 1,000 mcg  1,000 mcg Intramuscular Once Earlie Server, MD       famotidine (PEPCID) 20-0.9 MG/50ML-% IVPB             VITAL SIGNS: LMP 11/19/2002 (Approximate)  There were no vitals filed for this visit.  Estimated body mass index is 18.64 kg/m as calculated from the following:   Height as of 10/19/21: '5\' 2"'$  (1.575 m).   Weight as of an earlier encounter on 05/11/22: 101 lb 14.4 oz (46.2 kg).  LABS: CBC:    Component Value Date/Time   WBC 10.1 04/27/2022  1102   HGB 13.2 04/27/2022 1102   HGB 12.7 02/17/2014 0323   HCT 39.1 04/27/2022 1102   HCT 39.8 01/05/2022 1509   PLT 199 04/27/2022 1102   PLT 156 02/17/2014 0323   MCV 90.3 04/27/2022 1102   MCV 100 02/17/2014 0323   NEUTROABS 6.9 04/27/2022 1102   NEUTROABS 5.9 02/17/2014 0323   LYMPHSABS 2.5 04/27/2022 1102   LYMPHSABS 3.2 02/17/2014 0323   MONOABS 0.5 04/27/2022 1102   MONOABS 0.4 02/17/2014 0323   EOSABS 0.2 04/27/2022 1102   EOSABS 0.0 02/17/2014 0323   BASOSABS 0.0 04/27/2022 1102   BASOSABS 0.1  02/17/2014 0323   Comprehensive Metabolic Panel:    Component Value Date/Time   NA 134 (L) 04/27/2022 1102   NA 134 (L) 02/17/2014 0323   K 3.6 04/27/2022 1102   K 3.3 (L) 02/17/2014 1113   CL 97 (L) 04/27/2022 1102   CL 100 02/17/2014 0323   CO2 24 04/27/2022 1102   CO2 26 02/17/2014 0323   BUN <5 (L) 04/27/2022 1102   BUN 6 (L) 02/17/2014 0323   CREATININE 0.67 04/27/2022 1102   CREATININE 0.77 01/27/2017 1418   GLUCOSE 108 (H) 04/27/2022 1102   GLUCOSE 108 (H) 02/17/2014 0323   CALCIUM 8.9 04/27/2022 1102   CALCIUM 7.9 (L) 02/17/2014 0323   AST 25 04/27/2022 1102   AST 41 (H) 02/17/2014 0323   ALT 10 04/27/2022 1102   ALT 36 02/17/2014 0323   ALKPHOS 102 04/27/2022 1102   ALKPHOS 47 02/17/2014 0323   BILITOT 1.0 04/27/2022 1102   BILITOT 0.8 02/17/2014 0323   PROT 7.0 04/27/2022 1102   PROT 5.9 (L) 02/17/2014 0323   ALBUMIN 4.1 04/27/2022 1102   ALBUMIN 3.0 (L) 02/17/2014 0323    RADIOGRAPHIC STUDIES: No results found.  PERFORMANCE STATUS (ECOG) : 1 - Symptomatic but completely ambulatory  Review of Systems Unless otherwise noted, a complete review of systems is negative.  Physical Exam General: NAD Pulmonary: Unlabored Extremities: no edema, no joint deformities Skin: no rashes Neurological: nonfocal  IMPRESSION: Follow-up visit.  Patient reports that she is doing reasonably well.  She denies any acute changes or concerns today.  She says  that she has intermittent discomfort in the right axilla but this is chronic since her surgery.  She is not currently taking anything for pain.  Patient reports mostly stable moods.  She says that she occasionally has days when she experiences the grief after the loss of her brother and sister but says that these days are infrequent.  I previously started her on Zoloft but she says that she did not take it long.  I offered referral to counseling but patient declined.  She says that her sister was "seeing somebody" for counseling and she thinks that that contributed to her death.  PLAN: -Continue current scope of treatment -Follow-up as needed   Patient expressed understanding and was in agreement with this plan. She also understands that She can call the clinic at any time with any questions, concerns, or complaints.     Time Total: 15 minutes  Visit consisted of counseling and education dealing with the complex and emotionally intense issues of symptom management and palliative care in the setting of serious and potentially life-threatening illness.Greater than 50%  of this time was spent counseling and coordinating care related to the above assessment and plan.  Signed by: Altha Harm, PhD, NP-C

## 2022-05-11 NOTE — Assessment & Plan Note (Signed)
Smoke cessation was discussed with patient. Check CT chest wo contrast, lung cancer screening negative.

## 2022-05-11 NOTE — Progress Notes (Signed)
Hematology/Oncology Progress note [phone] [phone]       CHIEF COMPLAINTS/REASON FOR VISIT:  Follow-up for triple negative right breast cancer.   ASSESSMENT & PLAN:   Cancer Staging  Invasive ductal carcinoma of right breast ([place]) Staging form: Breast, AJCC 8th Edition - Pathologic stage from 10/19/2021: No Stage Recommended (ypT2, pN0, cM0, G3, ER-, PR-, HER2-) - Signed by Earlie Server, MD on 11/03/2021   Invasive ductal carcinoma of right breast ([place]) Clinical stage III triple negative right breast cancer. cT3 cN0-1 S/p  neoadjuvant carboplatin Taxol Keytruda followed by [facility] with Keytruda x 3.   4th cycle of AC with Beryle Flock was not given due to poor tolerance. Status post right mastectomy with sentinel lymph node biopsy. ypT2 ypN0, residual disease, Patient declines any adjuvant chemotherapy or immunotherapy Labs are reviewed and discussed with patient.  Continue clinical surveillance.  B12 deficiency B12 improved, B12 injection today and repeat another dose in 4 week.   Chemotherapy-induced neuropathy (HCC) Grade 1, Stable symptoms.   Tobacco abuse Smoke cessation was discussed with patient. Check CT chest wo contrast, lung cancer screening negative.   Vitamin D deficiency Recommend vitamin D supplementation.   Port-A-Cath in place Patient requests to have medi port discontinued. She understands that she may developed recurrence in the future and may need to have medi port placed again.  Her tumor markers are within normal limits, however, have increased  I will repeat CT chest abdomen pelvis, if negative, she will get medi port removed.     Orders Placed This Encounter  Procedures   CT CHEST ABDOMEN PELVIS W CONTRAST    Standing Status:   Future    Standing Expiration Date:   05/12/2023    Order Specific Question:   If indicated for the ordered procedure, I authorize the administration of contrast media per Radiology protocol    Answer:    Yes    Order Specific Question:   Does the patient have a contrast media/X-ray dye allergy?    Answer:   No    Order Specific Question:   Preferred imaging location?    Answer:   [facility]    Order Specific Question:   Is Oral Contrast requested for this exam?    Answer:   Yes, Per Radiology protocol   CBC with Differential ([place] Only)    Standing Status:   Future    Standing Expiration Date:   05/12/2023   CMP ([place] only)    Standing Status:   Future    Standing Expiration Date:   05/12/2023   Vitamin B12    Standing Status:   Future    Standing Expiration Date:   05/12/2023   Folate    Standing Status:   Future    Standing Expiration Date:   05/12/2023   Cancer antigen 15-3    Standing Status:   Future    Standing Expiration Date:   05/12/2023   Cancer antigen 27.29    Standing Status:   Future    Standing Expiration Date:   05/12/2023   Follow-up in 3 months.  All questions were answered. The patient knows to call the clinic with any problems, questions or concerns.  Earlie Server, MD, PhD [facility] Hematology Oncology 05/11/2022        HISTORY OF PRESENTING ILLNESS:   MARENA BUCKHANNON is a  62 y.o.  female presents for follow-up of triple negative breast cancer. Oncology History  Invasive ductal carcinoma of right  breast ([place])  11/11/2020 Genetic Testing   Genetic testing done at [facility].  Per note, negative.   02/20/2021 Initial Diagnosis   Invasive ductal carcinoma of right breast (HCC)\  -August 2022, patient was diagnosed with right breast stage IIIb cT3 N0M0, grade 2, ER negative, PR negative HER2 negative breast cancer.  Patient self palpated breast mass many years ago.  10/21/2020 diagnostic mammogram and ultrasound confirmed presence of mass and a subsequent biopsy revealed right breast triple negative cancer. There was plan for patient to start with neoadjuvant chemotherapy followed by surgery and radiation.  Unfortunately patient never started  treatment.    Medi port was placed at [facility].  Established care with me on 02/19/2021    02/24/2021 Imaging   CT chest abdomen pelvis showed large right breast mass, 5.5 x 4.8 cm with small right axillary lymph node with variable degrees of enhancement potentially involved but not specific based on size.  This tracked down to retropectoral nodal stations largest lymph node along the lateral margin of the pectoralis major.  Tiny pulmonary nodules in the chest as discussed.  These are nonspecific but would consider short interval follow-up to assess for any changes.  No evidence of metastatic disease involving the abdomen or pelvis.  Aortic atherosclerosis.   02/25/2021 Echocardiogram   echocardiogram showed LVEF 65 to 70%.  Patient has been to chemotherapy class and understands antiemetics instructions   03/11/2021 - 08/26/2021 Chemotherapy   BREAST Pembrolizumab (200) D1 + Carboplatin (5) D1 + Paclitaxel (80) D1,8,15 q21d X 4 cycles, followed by Pembrolizumab (200) D1 + AC D1 q21d x 3 cycles. 4th cycle was omitted due to poor tolerance.     03/16/2021 Imaging   bone scan showed no evidence of osseous metastatic breast cancer, asymmetric osseous uptake on the right at L5-S1, corresponding to degenerative disc disease on recent CT.  Asymmetric soft tissue uptake in the right breast   07/13/2021 Echocardiogram   Echocardiogram showed LVEF 60-65%.  Grade 1 diastolic dysfunction.  Refer details to echocardiogram reports.   10/19/2021 Surgery   Patient underwent right modified radical mastectomy  Pathology showed invasive mammary carcinoma, no special type, multifocal, with focal chondroid stroma.  Clip and biopsy sites identified.  12 lymph nodes negative for malignancy.  2 lymph nodes displaying dense fibrous scarring, compatible with pathological complete response.  Benign nipple/areola.   Grade 3, LVI not identified, DCIS not identified.  All margins negative for invasive carcinoma.   ypT2  ypN0  Comment Due to the presence of dense fibrosis and treatment effect in two of the sampled lymph nodes, pancytokeratin stains were performed on all submitted lymph nodes.  All lymph nodes are negative for residual metastatic carcinoma.  A separate focus of invasive carcinoma, measuring approximately 11 mm in greatest extent, and histologically similar to the primary tumor, is identified within sampling of the fibrous tissue adjacent to the primary tumor.    10/19/2021 Cancer Staging   Staging form: Breast, AJCC 8th Edition - Pathologic stage from 10/19/2021: No Stage Recommended (ypT2, pN0, cM0, G3, ER-, PR-, HER2-) - Signed by Earlie Server, MD on 11/03/2021 Stage prefix: Post-therapy Response to neoadjuvant therapy: Partial response Multigene prognostic tests performed: None Histologic grading system: 3 grade system    07/16/2021-07/19/2021, patient was admitted for HCAP patient was treated with antibiotics 09/04/2021 - 09/08/2021 Patient was admitted due to pancytopenia, thrush electrolyte abnormality.     INTERVAL HISTORY BHUMIKA PELISSIER is a 62 y.o. female who has above history reviewed by  me today presents for follow up visit for management of triple negative breast cancer She reports feeling well, she wants medi port to be removed as the port bothers her.  No new complaints.     Review of Systems  Constitutional:  Positive for fatigue. Negative for appetite change, chills and fever.  HENT:   Negative for hearing loss, mouth sores and voice change.   Eyes:  Negative for eye problems.  Respiratory:  Negative for chest tightness and cough.        Right-sided chest wall pain  Cardiovascular:  Negative for chest pain.  Gastrointestinal:  Negative for abdominal distention, abdominal pain, blood in stool, diarrhea and nausea.  Endocrine: Negative for hot flashes.  Genitourinary:  Negative for difficulty urinating, dysuria and frequency.   Musculoskeletal:  Negative for arthralgias.  Skin:   Negative for itching and rash.  Neurological:  Positive for numbness. Negative for extremity weakness.  Hematological:  Negative for adenopathy.  Psychiatric/Behavioral:  Negative for confusion. The patient is not nervous/anxious.     MEDICAL HISTORY:  Past Medical History:  Diagnosis Date   Acute kidney injury ([place])    Anxiety    Aortic atherosclerosis ([place]) 10/24/2016   Breast cancer (HCC)    Chemotherapy-induced neuropathy (HCC)    Essential hypertension    GERD (gastroesophageal reflux disease)    Hypokalemia    Hypomagnesemia    Invasive ductal carcinoma of right breast (HCC)    Metabolic acidosis    Personal history of chemotherapy    Pneumonia    Severe protein-calorie malnutrition (HCC)    Thrombocytopenia (HCC)     SURGICAL HISTORY: Past Surgical History:  Procedure Laterality Date   BREAST BIOPSY Right 2022   FOOT SURGERY Left    little toe correction   MASTECTOMY W/ SENTINEL NODE BIOPSY Right 10/19/2021   Procedure: MASTECTOMY WITH SENTINEL LYMPH NODE BIOPSY ( modified radical);  Surgeon: Herbert Pun, MD;  Location: [facility] ORS;  Service: General;  Laterality: Right;   PORTA CATH INSERTION  01/19/2021    SOCIAL HISTORY: Social History   Socioeconomic History   Marital status: Widowed    Spouse name: Not on file   Number of children: 1   Years of education: Not on file   Highest education level: Not on file  Occupational History   Not on file  Tobacco Use   Smoking status: Every Day    Packs/day: 1.00    Years: 36.00    Total pack years: 36.00    Types: Cigarettes    Start date: 03/15/1979   Smokeless tobacco: Never  Vaping Use   Vaping Use: Never used  Substance and Sexual Activity   Alcohol use: No    Alcohol/week: 3.0 standard drinks of alcohol    Types: 3 Standard drinks or equivalent per week   Drug use: Not Currently   Sexual activity: Not Currently    Birth control/protection: None  Other Topics Concern   Not on file  Social  History Narrative   Lives with mother   Social Determinants of Health   Financial Resource Strain: Low Risk  (01/06/2022)   Overall Financial Resource Strain (CARDIA)    Difficulty of Paying Living Expenses: Not very hard  Food Insecurity: No Food Insecurity (01/06/2022)   Hunger Vital Sign    Worried About Running Out of Food in the Last Year: Never true    Ran Out of Food in the Last Year: Never true  Transportation Needs: No Transportation Needs (01/06/2022)  PRAPARE - Hydrologist (Medical): No    Lack of Transportation (Non-Medical): No  Physical Activity: Inactive (01/06/2022)   Exercise Vital Sign    Days of Exercise per Week: 0 days    Minutes of Exercise per Session: 0 min  Stress: Stress Concern Present (01/06/2022)   [place]    Feeling of Stress : Rather much  Social Connections: Moderately Isolated (01/06/2022)   Social Connection and Isolation Panel [NHANES]    Frequency of Communication with Friends and Family: Once a week    Frequency of Social Gatherings with Friends and Family: Once a week    Attends Religious Services: 1 to 4 times per year    Active Member of [employer] or Organizations: Yes    Attends Archivist Meetings: Never    Marital Status: Widowed  Intimate Partner Violence: Not At Risk (01/06/2022)   Humiliation, Afraid, Rape, and Kick questionnaire    Fear of Current or Ex-Partner: No    Emotionally Abused: No    Physically Abused: No    Sexually Abused: No    FAMILY HISTORY: Family History  Problem Relation Age of Onset   Cancer Mother 23       breast   Cancer Father        melonma   Cirrhosis Maternal Grandfather    Cancer Paternal Grandmother        skin/breast    ALLERGIES:  has No Known Allergies.  MEDICATIONS:  Current Outpatient Medications  Medication Sig Dispense Refill   Calcium Carb-Cholecalciferol (OYSTER SHELL CALCIUM  W/D) 500-5 MG-MCG TABS Take by mouth.     fluticasone (FLONASE) 50 MCG/ACT nasal spray Place 1 spray into both nostrils daily.     ibuprofen (ADVIL) 200 MG tablet Take 200-400 mg by mouth every 6 (six) hours as needed for headache.     lidocaine-prilocaine (EMLA) cream Apply to affected area once 30 g 3   magnesium chloride (SLOW-MAG) 64 MG TBEC SR tablet Take 1 tablet (64 mg total) by mouth 2 (two) times daily. 60 tablet 2   omeprazole (PRILOSEC) 40 MG capsule Take 1 capsule (40 mg total) by mouth daily. 30 capsule 3   VENTOLIN HFA 108 (90 Base) MCG/ACT inhaler Inhale 1-2 puffs into the lungs every 6 (six) hours as needed for wheezing or shortness of breath.     calcium carbonate (TUMS EX) 750 MG chewable tablet Chew 2 tablets by mouth 4 (four) times daily as needed for heartburn.     naloxone (NARCAN) nasal spray 4 mg/0.1 mL SMARTSIG:1 Both Nares Daily (Patient not taking: Reported on 01/25/2022)     No current facility-administered medications for this visit.   Facility-Administered Medications Ordered in Other Visits  Medication Dose Route Frequency Provider Last Rate Last Admin   famotidine (PEPCID) 20-0.9 MG/50ML-% IVPB              PHYSICAL EXAMINATION: ECOG PERFORMANCE STATUS: 1 - Symptomatic but completely ambulatory Vitals:   05/11/22 0932  BP: 134/80  Pulse: 94  Resp: 18  Temp: 98.4 F (36.9 C)  SpO2: 100%   Filed Weights   05/11/22 0932  Weight: 101 lb 14.4 oz (46.2 kg)    Physical Exam Constitutional:      General: She is not in acute distress. HENT:     Head: Normocephalic and atraumatic.  Eyes:     General: No scleral icterus. Cardiovascular:     Rate  and Rhythm: Normal rate and regular rhythm.     Heart sounds: Normal heart sounds.  Pulmonary:     Effort: Pulmonary effort is normal. No respiratory distress.     Breath sounds: No wheezing.  Abdominal:     General: Bowel sounds are normal. There is no distension.     Palpations: Abdomen is soft.   Musculoskeletal:        General: No deformity. Normal range of motion.     Cervical back: Normal range of motion and neck supple.  Skin:    General: Skin is warm and dry.     Findings: No erythema or rash.  Neurological:     Mental Status: She is alert and oriented to person, place, and time. Mental status is at baseline.     Cranial Nerves: No cranial nerve deficit.     Coordination: Coordination normal.  Psychiatric:        Mood and Affect: Mood normal.      LABORATORY DATA:  I have reviewed the data as listed    Latest Ref Rng & Units 04/27/2022   11:02 AM 03/11/2022   10:58 AM 01/05/2022    3:09 PM  CBC  WBC 4.0 - 10.5 K/uL 10.1  12.0    Hemoglobin 12.0 - 15.0 g/dL 13.2  11.2    Hematocrit 36.0 - 46.0 % 39.1  32.2  39.8   Platelets 150 - 400 K/uL 199  254        Latest Ref Rng & Units 04/27/2022   11:02 AM 03/11/2022   10:58 AM 01/05/2022    1:41 PM  CMP  Glucose 70 - 99 mg/dL 108  122  122   BUN 8 - 23 mg/dL '5  10  7   '$ Creatinine 0.44 - 1.00 mg/dL 0.67  0.62  0.60   Sodium 135 - 145 mmol/L 134  134  137   Potassium 3.5 - 5.1 mmol/L 3.6  3.4  3.7   Chloride 98 - 111 mmol/L 97  101  108   CO2 22 - 32 mmol/L '24  22  22   '$ Calcium 8.9 - 10.3 mg/dL 8.9  8.6  8.7   Total Protein 6.5 - 8.1 g/dL 7.0  6.7  6.8   Total Bilirubin 0.3 - 1.2 mg/dL 1.0  0.3  0.5   Alkaline Phos 38 - 126 U/L 102  56  67   AST 15 - 41 U/L '25  18  21   '$ ALT 0 - 44 U/L '10  9  13         '$ RADIOGRAPHIC STUDIES: I have personally reviewed the radiological images as listed and agreed with the findings in the report. No results found.

## 2022-05-11 NOTE — Assessment & Plan Note (Signed)
Grade 1, Stable symptoms.

## 2022-05-11 NOTE — Assessment & Plan Note (Signed)
Recommend vitamin D supplementation.

## 2022-05-12 ENCOUNTER — Inpatient Hospital Stay: Payer: Medicaid Other

## 2022-05-14 ENCOUNTER — Other Ambulatory Visit: Payer: Self-pay | Admitting: Oncology

## 2022-05-17 ENCOUNTER — Inpatient Hospital Stay: Payer: Medicaid Other | Attending: Oncology

## 2022-05-18 ENCOUNTER — Other Ambulatory Visit: Payer: Self-pay | Admitting: Nurse Practitioner

## 2022-05-25 ENCOUNTER — Ambulatory Visit
Admission: RE | Admit: 2022-05-25 | Discharge: 2022-05-25 | Disposition: A | Discharge status: Home / Self Care | Payer: Medicaid Other | Source: Ambulatory Visit | Attending: Oncology | Admitting: Oncology

## 2022-05-25 DIAGNOSIS — C50911 Malignant neoplasm of unspecified site of right female breast: Secondary | ICD-10-CM | POA: Diagnosis present

## 2022-05-25 MED ORDER — IOHEXOL 300 MG/ML  SOLN
80.0000 mL | Freq: Once | INTRAMUSCULAR | Status: AC | PRN
  Administered 2022-05-25: 80 mL via INTRAVENOUS

## 2022-05-30 ENCOUNTER — Telehealth: Payer: Self-pay | Admitting: *Deleted

## 2022-05-30 NOTE — Telephone Encounter (Signed)
Patient called asking for results of her scan , Her next appointment to see Dr Tasia Catchings is not until May  IMPRESSION: 1. Interval right mastectomy with resolution of suspicious right axillary nodes. 2. Enlargement of a 5 mm right lower lobe pulmonary nodule. Development of a 5 mm left upper lobe pulmonary nodule. Cannot exclude early metastatic disease. Consider chest CT follow-up at 3 months. 3. Otherwise, no evidence of metastatic disease in the chest, abdomen, or pelvis. 4. Age advanced coronary artery atherosclerosis. Recommend assessment of coronary risk factors. 5. Aortic atherosclerosis (ICD10-I70.0) and emphysema (ICD10-J43.9). 6. Mild enlargement of the common duct, which is now minimally dilated for age. Correlate with bilirubin levels. If elevated, consider ERCP.     Electronically Signed   By: Abigail Miyamoto M.D.   On: 05/27/2022 11:47

## 2022-05-31 ENCOUNTER — Encounter: Payer: Self-pay | Admitting: Oncology

## 2022-05-31 NOTE — Telephone Encounter (Signed)
Call returned to patient and informed of doctor response. She asked was she going to be put on medicine I told her no, we will just repeat scan after seen in May. She was agreeable to this

## 2022-06-02 ENCOUNTER — Encounter: Payer: Self-pay | Admitting: Oncology

## 2022-06-05 ENCOUNTER — Other Ambulatory Visit: Payer: Self-pay | Admitting: Nurse Practitioner

## 2022-06-09 ENCOUNTER — Ambulatory Visit: Payer: Medicaid Other | Admitting: Nurse Practitioner

## 2022-06-10 ENCOUNTER — Inpatient Hospital Stay: Payer: Medicaid Other

## 2022-06-17 ENCOUNTER — Other Ambulatory Visit: Payer: Medicaid Other

## 2022-06-17 ENCOUNTER — Other Ambulatory Visit: Payer: Self-pay | Admitting: Nurse Practitioner

## 2022-06-18 LAB — CBC WITH DIFFERENTIAL/PLATELET
Basophils Absolute: 0 10*3/uL (ref 0.0–0.2)
Basos: 0 %
EOS (ABSOLUTE): 0 10*3/uL (ref 0.0–0.4)
Eos: 0 %
Hematocrit: 41.6 % (ref 34.0–46.6)
Hemoglobin: 13.9 g/dL (ref 11.1–15.9)
Immature Grans (Abs): 0 10*3/uL (ref 0.0–0.1)
Immature Granulocytes: 1 %
Lymphocytes Absolute: 1.8 10*3/uL (ref 0.7–3.1)
Lymphs: 21 %
MCH: 30.5 pg (ref 26.6–33.0)
MCHC: 33.4 g/dL (ref 31.5–35.7)
MCV: 91 fL (ref 79–97)
Monocytes Absolute: 0.3 10*3/uL (ref 0.1–0.9)
Monocytes: 4 %
Neutrophils Absolute: 6.3 10*3/uL (ref 1.4–7.0)
Neutrophils: 74 %
Platelets: 272 10*3/uL (ref 150–450)
RBC: 4.56 x10E6/uL (ref 3.77–5.28)
RDW: 14.5 % (ref 11.7–15.4)
WBC: 8.4 10*3/uL (ref 3.4–10.8)

## 2022-06-18 LAB — COMPREHENSIVE METABOLIC PANEL
ALT: 10 IU/L (ref 0–32)
AST: 16 IU/L (ref 0–40)
Albumin/Globulin Ratio: 2 (ref 1.2–2.2)
Albumin: 4.5 g/dL (ref 3.9–4.9)
Alkaline Phosphatase: 76 IU/L (ref 44–121)
BUN/Creatinine Ratio: 15 (ref 12–28)
BUN: 12 mg/dL (ref 8–27)
Bilirubin Total: 0.3 mg/dL (ref 0.0–1.2)
CO2: 21 mmol/L (ref 20–29)
Calcium: 10.6 mg/dL — ABNORMAL HIGH (ref 8.7–10.3)
Chloride: 98 mmol/L (ref 96–106)
Creatinine, Ser: 0.82 mg/dL (ref 0.57–1.00)
Globulin, Total: 2.2 g/dL (ref 1.5–4.5)
Glucose: 101 mg/dL — ABNORMAL HIGH (ref 70–99)
Potassium: 4.4 mmol/L (ref 3.5–5.2)
Sodium: 135 mmol/L (ref 134–144)
Total Protein: 6.7 g/dL (ref 6.0–8.5)
eGFR: 81 mL/min/{1.73_m2} (ref >59)

## 2022-06-18 LAB — LIPID PANEL W/O CHOL/HDL RATIO
Cholesterol, Total: 263 mg/dL — ABNORMAL HIGH (ref 100–199)
HDL: 82 mg/dL (ref >39)
LDL Chol Calc (NIH): 159 mg/dL — ABNORMAL HIGH (ref 0–99)
Triglycerides: 129 mg/dL (ref 0–149)
VLDL Cholesterol Cal: 22 mg/dL (ref 5–40)

## 2022-06-18 LAB — HGB A1C W/O EAG: Hgb A1c MFr Bld: 5.5 % (ref 4.8–5.6)

## 2022-06-18 LAB — TSH: TSH: 1.06 u[IU]/mL (ref 0.450–4.500)

## 2022-06-21 ENCOUNTER — Other Ambulatory Visit: Payer: Self-pay | Admitting: Nurse Practitioner

## 2022-06-21 ENCOUNTER — Encounter: Payer: Self-pay | Admitting: Nurse Practitioner

## 2022-06-21 ENCOUNTER — Ambulatory Visit: Payer: Medicaid Other | Admitting: Nurse Practitioner

## 2022-06-21 VITALS — BP 154/96 | HR 102 | Ht 62.0 in | Wt 109.0 lb

## 2022-06-21 DIAGNOSIS — E782 Mixed hyperlipidemia: Secondary | ICD-10-CM | POA: Diagnosis not present

## 2022-06-21 DIAGNOSIS — E559 Vitamin D deficiency, unspecified: Secondary | ICD-10-CM | POA: Diagnosis not present

## 2022-06-21 DIAGNOSIS — R252 Cramp and spasm: Secondary | ICD-10-CM | POA: Diagnosis not present

## 2022-06-21 DIAGNOSIS — R5383 Other fatigue: Secondary | ICD-10-CM | POA: Diagnosis not present

## 2022-06-21 MED ORDER — SIMVASTATIN 20 MG PO TABS: 20.0000 mg | ORAL_TABLET | Freq: Every evening | ORAL | 11 refills | Status: DC

## 2022-06-21 MED ORDER — CICLOPIROX 8 % EX KIT: 1.0000 | PACK | Freq: Every evening | CUTANEOUS | 3 refills | Status: DC

## 2022-06-21 NOTE — Progress Notes (Signed)
Established Patient Office Visit  Subjective:  Patient ID: Yvonne Scott, female    DOB: [birth year]  Age: 62 y.o. MRN: [id number]  Chief Complaint  Patient presents with   Follow-up    3 month follow up, review lab results.    3 month follow up and review of recent fasting labs, tongue feels like it has a coating on it despite two rounds of fungal liquid medication since finishing chemo.  Yeast infection of the vaginal area, will treat with fluconazole for 7 days.  Both feet have toenail fungal infection.    No other concerns at this time.   Past Medical History:  Diagnosis Date   Acute kidney injury    Anxiety    Aortic atherosclerosis 10/24/2016   Breast cancer    Chemotherapy-induced neuropathy    Essential hypertension    GERD (gastroesophageal reflux disease)    Hypokalemia    Hypomagnesemia    Invasive ductal carcinoma of right breast    Metabolic acidosis    Personal history of chemotherapy    Pneumonia    Severe protein-calorie malnutrition    Thrombocytopenia     Past Surgical History:  Procedure Laterality Date   BREAST BIOPSY Right 2022   FOOT SURGERY Left    little toe correction   MASTECTOMY W/ SENTINEL NODE BIOPSY Right 10/19/2021   Procedure: MASTECTOMY WITH SENTINEL LYMPH NODE BIOPSY ( modified radical);  Surgeon: Carolan Shiverintron-Diaz, Edgardo, MD;  Location: [facility] ORS;  Service: General;  Laterality: Right;   PORTA CATH INSERTION  01/19/2021    Social History   Socioeconomic History   Marital status: Widowed    Spouse name: Not on file   Number of children: 1   Years of education: Not on file   Highest education level: Not on file  Occupational History   Not on file  Tobacco Use   Smoking status: Every Day    Packs/day: 1.00    Years: 36.00    Additional pack years: 0.00    Total pack years: 36.00    Types: Cigarettes    Start date: 03/15/1979   Smokeless tobacco: Never  Vaping Use   Vaping Use: Never used  Substance and Sexual Activity    Alcohol use: No    Alcohol/week: 3.0 standard drinks of alcohol    Types: 3 Standard drinks or equivalent per week   Drug use: Not Currently   Sexual activity: Not Currently    Birth control/protection: None  Other Topics Concern   Not on file  Social History Narrative   Lives with mother   Social Determinants of Health   Financial Resource Strain: Low Risk  (01/06/2022)   Overall Financial Resource Strain (CARDIA)    Difficulty of Paying Living Expenses: Not very hard  Food Insecurity: No Food Insecurity (01/06/2022)   Hunger Vital Sign    Worried About Running Out of Food in the Last Year: Never true    Ran Out of Food in the Last Year: Never true  Transportation Needs: No Transportation Needs (01/06/2022)   PRAPARE - Administrator, Civil ServiceTransportation    Lack of Transportation (Medical): No    Lack of Transportation (Non-Medical): No  Physical Activity: Inactive (01/06/2022)   Exercise Vital Sign    Days of Exercise per Week: 0 days    Minutes of Exercise per Session: 0 min  Stress: Stress Concern Present (01/06/2022)   [employer] - Occupational Stress Questionnaire    Feeling of Stress : Rather much  Social Connections: Moderately Isolated (01/06/2022)   Social Connection and Isolation Panel [NHANES]    Frequency of Communication with Friends and Family: Once a week    Frequency of Social Gatherings with Friends and Family: Once a week    Attends Religious Services: 1 to 4 times per year    Active Member of [employer] or Organizations: Yes    Attends Banker Meetings: Never    Marital Status: Widowed  Intimate Partner Violence: Not At Risk (01/06/2022)   Humiliation, Afraid, Rape, and Kick questionnaire    Fear of Current or Ex-Partner: No    Emotionally Abused: No    Physically Abused: No    Sexually Abused: No    Family History  Problem Relation Age of Onset   Cancer Mother 80       breast   Cancer Father        melonma   Cirrhosis Maternal  Grandfather    Cancer Paternal Grandmother        skin/breast    No Known Allergies  Review of Systems  Constitutional: Negative.   HENT: Negative.    Eyes: Negative.   Respiratory: Negative.    Cardiovascular: Negative.   Gastrointestinal: Negative.   Genitourinary: Negative.   Musculoskeletal: Negative.   Skin: Negative.   Neurological: Negative.   Endo/Heme/Allergies: Negative.   Psychiatric/Behavioral: Negative.         Objective:   BP (!) 154/96   Pulse (!) 102   Ht 5\' 2"  (1.575 m)   Wt 109 lb (49.4 kg)   LMP 11/19/2002 (Approximate)   SpO2 97%   BMI 19.94 kg/m   Vitals:   06/21/22 1255  BP: (!) 154/96  Pulse: (!) 102  Height: 5\' 2"  (1.575 m)  Weight: 109 lb (49.4 kg)  SpO2: 97%  BMI (Calculated): 19.93    Physical Exam Vitals reviewed.  Constitutional:      Appearance: Normal appearance.  HENT:     Head: Normocephalic.     Nose: Nose normal.     Mouth/Throat:     Mouth: Mucous membranes are moist.  Eyes:     Pupils: Pupils are equal, round, and reactive to light.  Cardiovascular:     Rate and Rhythm: Normal rate and regular rhythm.  Pulmonary:     Effort: Pulmonary effort is normal.     Breath sounds: Normal breath sounds.  Abdominal:     General: Bowel sounds are normal.     Palpations: Abdomen is soft.  Musculoskeletal:        General: Normal range of motion.     Cervical back: Normal range of motion and neck supple.  Skin:    General: Skin is warm and dry.  Neurological:     Mental Status: She is alert and oriented to person, place, and time.  Psychiatric:        Mood and Affect: Mood normal.        Behavior: Behavior normal.      No results found for any visits on 06/21/22.      Assessment & Plan:   Problem List Items Addressed This Visit       Other   Vitamin D deficiency   Mixed hyperlipidemia - Primary   Relevant Medications   simvastatin (ZOCOR) 20 MG tablet   Cramps, muscle, general   Other fatigue     Return in about 4 months (around 10/21/2022).   Total time spent: 35 minutes  Orson Eva, NP  06/21/2022

## 2022-06-21 NOTE — Patient Instructions (Signed)
1) Low chol diet handout 2) Follow up appt 4 months, fasting labs  3) Capsaicin cream

## 2022-06-24 ENCOUNTER — Telehealth: Payer: Self-pay

## 2022-06-24 NOTE — Telephone Encounter (Signed)
Pt called and left vm regarding from appt, if you have sent rx abx for her? I didn't see anything on her med list so just wanted to check, please advise

## 2022-06-24 NOTE — Telephone Encounter (Signed)
I think she means the yeast infection medication not an antibiotic?  I only sent in the antifungal solution for her toenails.  I can send in the oral tablet for antifungal too if she wants it

## 2022-06-29 ENCOUNTER — Other Ambulatory Visit: Payer: Self-pay | Admitting: Nurse Practitioner

## 2022-06-29 DIAGNOSIS — B3749 Other urogenital candidiasis: Secondary | ICD-10-CM

## 2022-06-29 MED ORDER — FLUCONAZOLE 50 MG PO TABS
150.0000 mg | ORAL_TABLET | Freq: Every day | ORAL | Status: AC
Start: 2022-06-29 — End: 2022-07-04

## 2022-06-29 MED ORDER — FLUCONAZOLE 100 MG PO TABS
100.0000 mg | ORAL_TABLET | Freq: Every day | ORAL | 0 refills | Status: DC
Start: 2022-06-29 — End: 2022-10-24

## 2022-07-12 ENCOUNTER — Inpatient Hospital Stay: Payer: Medicaid Other

## 2022-07-12 ENCOUNTER — Other Ambulatory Visit: Payer: Self-pay | Admitting: Nurse Practitioner

## 2022-07-12 ENCOUNTER — Inpatient Hospital Stay: Payer: Medicaid Other | Attending: Oncology

## 2022-07-12 DIAGNOSIS — E538 Deficiency of other specified B group vitamins: Secondary | ICD-10-CM | POA: Insufficient documentation

## 2022-07-12 DIAGNOSIS — Z95828 Presence of other vascular implants and grafts: Secondary | ICD-10-CM

## 2022-07-12 MED ORDER — HEPARIN SOD (PORK) LOCK FLUSH 100 UNIT/ML IV SOLN
500.0000 [IU] | Freq: Once | INTRAVENOUS | Status: AC
  Administered 2022-07-12: 500 [IU] via INTRAVENOUS
  Filled 2022-07-12: qty 5

## 2022-07-12 MED ORDER — SODIUM CHLORIDE 0.9% FLUSH
10.0000 mL | Freq: Once | INTRAVENOUS | Status: AC
  Administered 2022-07-12: 10 mL via INTRAVENOUS
  Filled 2022-07-12: qty 10

## 2022-07-12 MED ORDER — CYANOCOBALAMIN 1000 MCG/ML IJ SOLN
1000.0000 ug | Freq: Once | INTRAMUSCULAR | Status: AC
  Administered 2022-07-12: 1000 ug via INTRAMUSCULAR
  Filled 2022-07-12: qty 1

## 2022-07-26 ENCOUNTER — Other Ambulatory Visit: Payer: Self-pay | Admitting: Nurse Practitioner

## 2022-08-02 ENCOUNTER — Other Ambulatory Visit: Payer: Self-pay | Admitting: Nurse Practitioner

## 2022-08-04 ENCOUNTER — Other Ambulatory Visit: Payer: Self-pay | Admitting: Nurse Practitioner

## 2022-08-04 ENCOUNTER — Telehealth: Payer: Self-pay | Admitting: Nurse Practitioner

## 2022-08-04 NOTE — Telephone Encounter (Signed)
Yvonne Scott called, patient needs her prednisone sent in to [facility]. Please advise.

## 2022-08-04 NOTE — Telephone Encounter (Signed)
No, I've denied it twice today.  It is too early.  She can have it every 3 months.  She is much younger than [place] who gets it daily but it can cause diabetes, osteoporosis, etc

## 2022-08-09 ENCOUNTER — Inpatient Hospital Stay: Payer: Medicaid Other

## 2022-08-09 ENCOUNTER — Inpatient Hospital Stay: Payer: Medicaid Other | Admitting: Oncology

## 2022-08-09 ENCOUNTER — Inpatient Hospital Stay: Payer: Medicaid Other | Attending: Oncology

## 2022-08-09 DIAGNOSIS — Z171 Estrogen receptor negative status [ER-]: Secondary | ICD-10-CM | POA: Diagnosis not present

## 2022-08-09 DIAGNOSIS — C50911 Malignant neoplasm of unspecified site of right female breast: Secondary | ICD-10-CM | POA: Diagnosis present

## 2022-08-09 DIAGNOSIS — F17210 Nicotine dependence, cigarettes, uncomplicated: Secondary | ICD-10-CM | POA: Insufficient documentation

## 2022-08-09 LAB — VITAMIN B12: Vitamin B-12: 3383 pg/mL — ABNORMAL HIGH (ref 180–914)

## 2022-08-09 LAB — CBC WITH DIFFERENTIAL (CANCER CENTER ONLY)
Abs Immature Granulocytes: 0.03 10*3/uL (ref 0.00–0.07)
Basophils Absolute: 0.1 10*3/uL (ref 0.0–0.1)
Basophils Relative: 1 %
Eosinophils Absolute: 0.1 10*3/uL (ref 0.0–0.5)
Eosinophils Relative: 2 %
HCT: 38.9 % (ref 36.0–46.0)
Hemoglobin: 13.1 g/dL (ref 12.0–15.0)
Immature Granulocytes: 0 %
Lymphocytes Relative: 38 %
Lymphs Abs: 2.9 10*3/uL (ref 0.7–4.0)
MCH: 30.8 pg (ref 26.0–34.0)
MCHC: 33.7 g/dL (ref 30.0–36.0)
MCV: 91.3 fL (ref 80.0–100.0)
Monocytes Absolute: 0.6 10*3/uL (ref 0.1–1.0)
Monocytes Relative: 8 %
Neutro Abs: 4 10*3/uL (ref 1.7–7.7)
Neutrophils Relative %: 51 %
Platelet Count: 237 10*3/uL (ref 150–400)
RBC: 4.26 MIL/uL (ref 3.87–5.11)
RDW: 13.1 % (ref 11.5–15.5)
WBC Count: 7.7 10*3/uL (ref 4.0–10.5)
nRBC: 0 % (ref 0.0–0.2)

## 2022-08-09 LAB — CMP (CANCER CENTER ONLY)
ALT: 12 U/L (ref 0–44)
AST: 18 U/L (ref 15–41)
Albumin: 4.2 g/dL (ref 3.5–5.0)
Alkaline Phosphatase: 69 U/L (ref 38–126)
Anion gap: 10 (ref 5–15)
BUN: 9 mg/dL (ref 8–23)
CO2: 24 mmol/L (ref 22–32)
Calcium: 9.3 mg/dL (ref 8.9–10.3)
Chloride: 103 mmol/L (ref 98–111)
Creatinine: 0.64 mg/dL (ref 0.44–1.00)
GFR, Estimated: >60 mL/min (ref >60)
Glucose, Bld: 114 mg/dL — ABNORMAL HIGH (ref 70–99)
Potassium: 3.6 mmol/L (ref 3.5–5.1)
Sodium: 137 mmol/L (ref 135–145)
Total Bilirubin: 0.5 mg/dL (ref 0.3–1.2)
Total Protein: 7.1 g/dL (ref 6.5–8.1)

## 2022-08-09 LAB — FOLATE: Folate: 10.8 ng/mL (ref >5.9)

## 2022-08-10 LAB — CANCER ANTIGEN 27.29: CA 27.29: 10.2 U/mL (ref 0.0–38.6)

## 2022-08-10 LAB — CANCER ANTIGEN 15-3: CA 15-3: 14.3 U/mL (ref 0.0–25.0)

## 2022-08-12 ENCOUNTER — Telehealth: Payer: Self-pay | Admitting: Oncology

## 2022-08-12 ENCOUNTER — Inpatient Hospital Stay (HOSPITAL_BASED_OUTPATIENT_CLINIC_OR_DEPARTMENT_OTHER): Payer: Medicaid Other | Admitting: Oncology

## 2022-08-12 ENCOUNTER — Encounter: Payer: Self-pay | Admitting: Oncology

## 2022-08-12 ENCOUNTER — Inpatient Hospital Stay: Payer: Medicaid Other | Admitting: Oncology

## 2022-08-12 DIAGNOSIS — Z720 Tobacco use: Secondary | ICD-10-CM | POA: Diagnosis not present

## 2022-08-12 DIAGNOSIS — R911 Solitary pulmonary nodule: Secondary | ICD-10-CM | POA: Diagnosis not present

## 2022-08-12 DIAGNOSIS — C3491 Malignant neoplasm of unspecified part of right bronchus or lung: Secondary | ICD-10-CM | POA: Insufficient documentation

## 2022-08-12 DIAGNOSIS — C50911 Malignant neoplasm of unspecified site of right female breast: Secondary | ICD-10-CM

## 2022-08-12 NOTE — Assessment & Plan Note (Deleted)
Clinical stage III triple negative right breast cancer. cT3 cN0-1 S/p  neoadjuvant carboplatin Taxol Keytruda followed by AC with Keytruda x 3.   4th cycle of AC with keytruda was not given due to poor tolerance. Status post right mastectomy with sentinel lymph node biopsy. ypT2 ypN0, residual disease, Patient declines any adjuvant chemotherapy or immunotherapy Labs are reviewed and discussed with patient.  Continue clinical surveillance. 

## 2022-08-12 NOTE — Assessment & Plan Note (Signed)
Clinical stage III triple negative right breast cancer. cT3 cN0-1 S/p  neoadjuvant carboplatin Taxol Keytruda followed by AC with Keytruda x 3.   4th cycle of AC with keytruda was not given due to poor tolerance. Status post right mastectomy with sentinel lymph node biopsy. ypT2 ypN0, residual disease, Patient declines any adjuvant chemotherapy or immunotherapy Labs are reviewed and discussed with patient.  Continue clinical surveillance. 

## 2022-08-12 NOTE — Assessment & Plan Note (Addendum)
CT images were reviewed with patient. Repeat CT in 3 months.

## 2022-08-12 NOTE — Telephone Encounter (Signed)
Yvonne Scott called and left message with answering service to cancel her appt today with Dr Cathie Hoops @ 10 am. She had cataract surgery and can't make it. Please call her to go over results she said.[phone]

## 2022-08-12 NOTE — Progress Notes (Signed)
HEMATOLOGY-ONCOLOGY TeleHEALTH VISIT PROGRESS NOTE  I connected with Yvonne Scott on 08/12/22  at  9:45 AM EDT by telephone visit and verified that I am speaking with the correct person using two identifiers. I discussed the limitations, risks, security and privacy concerns of performing an evaluation and management service by telemedicine and the availability of in-person appointments. The patient expressed understanding and agreed to proceed.   Other persons participating in the visit and their role in the encounter:  None  Patient's location: Home  Provider's location: office Chief Complaint: Triple negative breast cancer.   INTERVAL HISTORY Yvonne Scott is a 62 y.o. female who has above history reviewed by me today presents for follow up visit for triple negative breast cancer Patient had in person visit and requested to switch to telephone visit today.  She just had cataract surgery. She has no new complaints today.  Review of Systems  Constitutional:  Negative for appetite change, chills, fatigue and fever.  HENT:   Negative for hearing loss and voice change.   Eyes:        Status post cataract surgery  Respiratory:  Negative for chest tightness and cough.   Cardiovascular:  Negative for chest pain.  Gastrointestinal:  Negative for abdominal distention, abdominal pain and blood in stool.  Endocrine: Negative for hot flashes.  Genitourinary:  Negative for difficulty urinating and frequency.   Musculoskeletal:  Negative for arthralgias.  Skin:  Negative for itching and rash.  Neurological:  Negative for extremity weakness.  Hematological:  Negative for adenopathy.  Psychiatric/Behavioral:  Negative for confusion.     Past Medical History:  Diagnosis Date   Acute kidney injury (HCC)    Anxiety    Aortic atherosclerosis (HCC) 10/24/2016   Breast cancer (HCC)    Chemotherapy-induced neuropathy (HCC)    Essential hypertension    GERD (gastroesophageal reflux disease)     Hypokalemia    Hypomagnesemia    Invasive ductal carcinoma of right breast (HCC)    Metabolic acidosis    Personal history of chemotherapy    Pneumonia    Severe protein-calorie malnutrition (HCC)    Thrombocytopenia (HCC)    Past Surgical History:  Procedure Laterality Date   BREAST BIOPSY Right 2022   FOOT SURGERY Left    little toe correction   MASTECTOMY W/ SENTINEL NODE BIOPSY Right 10/19/2021   Procedure: MASTECTOMY WITH SENTINEL LYMPH NODE BIOPSY ( modified radical);  Surgeon: Carolan Shiver, MD;  Location: [facility] ORS;  Service: General;  Laterality: Right;   PORTA CATH INSERTION  01/19/2021    Family History  Problem Relation Age of Onset   Cancer Mother 15       breast   Cancer Father        melonma   Cirrhosis Maternal Grandfather    Cancer Paternal Grandmother        skin/breast    Social History   Socioeconomic History   Marital status: Widowed    Spouse name: Not on file   Number of children: 1   Years of education: Not on file   Highest education level: Not on file  Occupational History   Not on file  Tobacco Use   Smoking status: Every Day    Packs/day: 1.00    Years: 36.00    Additional pack years: 0.00    Total pack years: 36.00    Types: Cigarettes    Start date: 03/15/1979   Smokeless tobacco: Never  Vaping Use   Vaping  Use: Never used  Substance and Sexual Activity   Alcohol use: No    Alcohol/week: 3.0 standard drinks of alcohol    Types: 3 Standard drinks or equivalent per week   Drug use: Not Currently   Sexual activity: Not Currently    Birth control/protection: None  Other Topics Concern   Not on file  Social History Narrative   Lives with mother   Social Determinants of Health   Financial Resource Strain: Low Risk  (01/06/2022)   Overall Financial Resource Strain (CARDIA)    Difficulty of Paying Living Expenses: Not very hard  Food Insecurity: No Food Insecurity (01/06/2022)   Hunger Vital Sign    Worried About Running  Out of Food in the Last Year: Never true    Ran Out of Food in the Last Year: Never true  Transportation Needs: No Transportation Needs (01/06/2022)   PRAPARE - Administrator, Civil Service (Medical): No    Lack of Transportation (Non-Medical): No  Physical Activity: Inactive (01/06/2022)   Exercise Vital Sign    Days of Exercise per Week: 0 days    Minutes of Exercise per Session: 0 min  Stress: Stress Concern Present (01/06/2022)   [employer] of Occupational Health - Occupational Stress Questionnaire    Feeling of Stress : Rather much  Social Connections: Moderately Isolated (01/06/2022)   Social Connection and Isolation Panel [NHANES]    Frequency of Communication with Friends and Family: Once a week    Frequency of Social Gatherings with Friends and Family: Once a week    Attends Religious Services: 1 to 4 times per year    Active Member of [employer] or Organizations: Yes    Attends Banker Meetings: Never    Marital Status: Widowed  Intimate Partner Violence: Not At Risk (01/06/2022)   Humiliation, Afraid, Rape, and Kick questionnaire    Fear of Current or Ex-Partner: No    Emotionally Abused: No    Physically Abused: No    Sexually Abused: No    Current Outpatient Medications on File Prior to Visit  Medication Sig Dispense Refill   Calcium Carb-Cholecalciferol (OYSTER SHELL CALCIUM W/D) 500-5 MG-MCG TABS Take by mouth.     calcium carbonate (TUMS EX) 750 MG chewable tablet Chew 2 tablets by mouth 4 (four) times daily as needed for heartburn.     ciclopirox (PENLAC) 8 % solution APPLY TOPICALLY AT BEDTIME 7 mL 3   fluconazole (DIFLUCAN) 100 MG tablet Take 1 tablet (100 mg total) by mouth daily. 5 tablet 0   fluticasone (FLONASE) 50 MCG/ACT nasal spray Place 1 spray into both nostrils daily.     furosemide (LASIX) 20 MG tablet TAKE 1 TABLET BY MOUTH ONCE DAILY IN THE MORNING FOR FLUID PILL.  ELEVATE FEET THROUGHOUT THE DAY, COMPRESSION SOCKS  RECOMMENDED 30 tablet 0   ibuprofen (ADVIL) 200 MG tablet Take 200-400 mg by mouth every 6 (six) hours as needed for headache.     lidocaine-prilocaine (EMLA) cream Apply to affected area once 30 g 3   magnesium chloride (SLOW-MAG) 64 MG TBEC SR tablet Take 1 tablet (64 mg total) by mouth 2 (two) times daily. 60 tablet 2   naloxone (NARCAN) nasal spray 4 mg/0.1 mL SMARTSIG:1 Both Nares Daily (Patient not taking: Reported on 01/25/2022)     omeprazole (PRILOSEC) 40 MG capsule Take 1 capsule (40 mg total) by mouth daily. 30 capsule 3   potassium chloride (MICRO-K) 10 MEQ CR capsule TAKE 1 CAPSULE  BY MOUTH ONCE DAILY WITH FUROSEMIDE TO AVOID LOW POTASSIUM OR CRAMPS 30 capsule 0   predniSONE (DELTASONE) 20 MG tablet TAKE 2 TABLETS BY MOUTH ONCE DAILY WITH FOOD FOR 5 DAYS 10 tablet 0   simvastatin (ZOCOR) 20 MG tablet Take 1 tablet (20 mg total) by mouth every evening. 30 tablet 11   VENTOLIN HFA 108 (90 Base) MCG/ACT inhaler Inhale 1-2 puffs into the lungs every 6 (six) hours as needed for wheezing or shortness of breath. (Patient not taking: Reported on 06/21/2022)     Current Facility-Administered Medications on File Prior to Visit  Medication Dose Route Frequency Provider Last Rate Last Admin   famotidine (PEPCID) 20-0.9 MG/50ML-% IVPB             No Known Allergies     Observations/Objective: There were no vitals filed for this visit. There is no height or weight on file to calculate BMI.  Physical Exam   Lab    Latest Ref Rng & Units 08/09/2022   10:05 AM 06/17/2022    1:49 PM 04/27/2022   11:02 AM  CBC  WBC 4.0 - 10.5 K/uL 7.7  8.4  10.1   Hemoglobin 12.0 - 15.0 g/dL 82.9  56.2  13.0   Hematocrit 36.0 - 46.0 % 38.9  41.6  39.1   Platelets 150 - 400 K/uL 237  272  199       Latest Ref Rng & Units 08/09/2022   10:06 AM 06/17/2022    1:49 PM 04/27/2022   11:02 AM  CMP  Glucose 70 - 99 mg/dL 865  784  696   BUN 8 - 23 mg/dL 9  12  <5   Creatinine 0.44 - 1.00 mg/dL 2.95  2.84  1.32    Sodium 135 - 145 mmol/L 137  135  134   Potassium 3.5 - 5.1 mmol/L 3.6  4.4  3.6   Chloride 98 - 111 mmol/L 103  98  97   CO2 22 - 32 mmol/L 24  21  24    Calcium 8.9 - 10.3 mg/dL 9.3  44.0  8.9   Total Protein 6.5 - 8.1 g/dL 7.1  6.7  7.0   Total Bilirubin 0.3 - 1.2 mg/dL 0.5  0.3  1.0   Alkaline Phos 38 - 126 U/L 69  76  102   AST 15 - 41 U/L 18  16  25    ALT 0 - 44 U/L 12  10  10        ASSESSMENT & PLAN:   Invasive ductal carcinoma of right breast (HCC) Clinical stage III triple negative right breast cancer. cT3 cN0-1 S/p  neoadjuvant carboplatin Taxol Keytruda followed by [facility] with Keytruda x 3.   4th cycle of AC with Rande Lawman was not given due to poor tolerance. Status post right mastectomy with sentinel lymph node biopsy. ypT2 ypN0, residual disease, Patient declines any adjuvant chemotherapy or immunotherapy Labs are reviewed and discussed with patient.  Continue clinical surveillance.  Tobacco abuse Smoke cessation was discussed with patient.   Lung nodule CT images were reviewed with patient. Repeat CT in 3 months.   Orders Placed This Encounter  Procedures   CT Chest Wo Contrast    Standing Status:   Future    Standing Expiration Date:   08/12/2023    Order Specific Question:   Preferred imaging location?    Answer:   [facility]   CBC with Differential (Cancer Center Only)    Standing Status:  Future    Standing Expiration Date:   08/12/2023   CMP (Cancer Center only)    Standing Status:   Future    Standing Expiration Date:   08/12/2023   Cancer antigen 27.29    Standing Status:   Future    Standing Expiration Date:   08/12/2023   Cancer antigen 15-3    Standing Status:   Future    Standing Expiration Date:   08/12/2023   Follow-up in 3 months.  I discussed the assessment and treatment plan with the patient. The patient was provided an opportunity to ask questions and all were answered. The patient agreed with the plan and demonstrated an  understanding of the instructions.  The patient was advised to call back or seek an in-person evaluation if the symptoms worsen or if the condition fails to improve as anticipated.  Rickard Patience, MD 08/12/2022 8:49 PM

## 2022-08-12 NOTE — Assessment & Plan Note (Addendum)
Smoke cessation was discussed with patient. 

## 2022-08-14 ENCOUNTER — Other Ambulatory Visit: Payer: Self-pay | Admitting: Nurse Practitioner

## 2022-08-14 DIAGNOSIS — K219 Gastro-esophageal reflux disease without esophagitis: Secondary | ICD-10-CM

## 2022-08-15 ENCOUNTER — Other Ambulatory Visit: Payer: Self-pay | Admitting: Oncology

## 2022-08-15 DIAGNOSIS — C50911 Malignant neoplasm of unspecified site of right female breast: Secondary | ICD-10-CM

## 2022-08-30 ENCOUNTER — Inpatient Hospital Stay: Payer: Medicaid Other

## 2022-09-04 ENCOUNTER — Other Ambulatory Visit: Payer: Self-pay | Admitting: Nurse Practitioner

## 2022-09-04 ENCOUNTER — Other Ambulatory Visit: Payer: Self-pay | Admitting: Oncology

## 2022-09-04 DIAGNOSIS — C50911 Malignant neoplasm of unspecified site of right female breast: Secondary | ICD-10-CM

## 2022-09-05 ENCOUNTER — Encounter: Payer: Self-pay | Admitting: Oncology

## 2022-09-09 ENCOUNTER — Ambulatory Visit
Admission: RE | Admit: 2022-09-09 | Discharge: 2022-09-09 | Disposition: A | Discharge status: Home / Self Care | Payer: Medicaid Other | Source: Ambulatory Visit | Attending: Oncology | Admitting: Oncology

## 2022-09-09 DIAGNOSIS — C50911 Malignant neoplasm of unspecified site of right female breast: Secondary | ICD-10-CM | POA: Diagnosis present

## 2022-09-14 ENCOUNTER — Telehealth: Payer: Self-pay | Admitting: *Deleted

## 2022-09-14 ENCOUNTER — Other Ambulatory Visit: Payer: Self-pay

## 2022-09-14 DIAGNOSIS — R9389 Abnormal findings on diagnostic imaging of other specified body structures: Secondary | ICD-10-CM

## 2022-09-14 DIAGNOSIS — C50911 Malignant neoplasm of unspecified site of right female breast: Secondary | ICD-10-CM

## 2022-09-14 NOTE — Telephone Encounter (Signed)
Called report  IMPRESSION: 1. Interval growth of three solid bilateral pulmonary nodules since 05/25/2022 chest CT, largest 0.9 cm in the anteromedial left upper lobe, highly suspicious for pulmonary metastases. 2. No thoracic adenopathy. 3. Three-vessel coronary atherosclerosis. 4. Aortic Atherosclerosis (ICD10-I70.0) and Emphysema (ICD10-J43.9).   These results will be called to the ordering clinician or representative by the Radiologist Assistant, and communication documented in the PACS or [employer].     Electronically Signed   By: Delbert Phenix M.D.   On: 09/13/2022 18:54

## 2022-09-14 NOTE — Telephone Encounter (Signed)
Per Dr. Cathie Hoops: please arrange her to get a PET asap- enlarging lung nodules. history of triple negative breast ca.   Pt has been informed of MD recommendation. Please contact pt once PET is scheduled.

## 2022-09-21 ENCOUNTER — Ambulatory Visit
Admission: RE | Admit: 2022-09-21 | Discharge: 2022-09-21 | Disposition: A | Discharge status: Home / Self Care | Payer: Medicaid Other | Source: Ambulatory Visit | Attending: Oncology | Admitting: Oncology

## 2022-09-21 DIAGNOSIS — R9389 Abnormal findings on diagnostic imaging of other specified body structures: Secondary | ICD-10-CM

## 2022-09-21 DIAGNOSIS — C50911 Malignant neoplasm of unspecified site of right female breast: Secondary | ICD-10-CM

## 2022-09-26 ENCOUNTER — Ambulatory Visit
Admission: RE | Admit: 2022-09-26 | Discharge: 2022-09-26 | Disposition: A | Discharge status: Home / Self Care | Payer: Medicaid Other | Source: Ambulatory Visit | Attending: Oncology | Admitting: Oncology

## 2022-09-26 VITALS — Ht 62.0 in | Wt 117.0 lb

## 2022-09-26 DIAGNOSIS — C50911 Malignant neoplasm of unspecified site of right female breast: Secondary | ICD-10-CM | POA: Insufficient documentation

## 2022-09-26 DIAGNOSIS — J439 Emphysema, unspecified: Secondary | ICD-10-CM | POA: Insufficient documentation

## 2022-09-26 DIAGNOSIS — R9389 Abnormal findings on diagnostic imaging of other specified body structures: Secondary | ICD-10-CM | POA: Diagnosis not present

## 2022-09-26 DIAGNOSIS — I700 Atherosclerosis of aorta: Secondary | ICD-10-CM | POA: Diagnosis not present

## 2022-09-26 DIAGNOSIS — I2510 Atherosclerotic heart disease of native coronary artery without angina pectoris: Secondary | ICD-10-CM | POA: Insufficient documentation

## 2022-09-26 LAB — GLUCOSE, CAPILLARY: Glucose-Capillary: 88 mg/dL (ref 70–99)

## 2022-09-26 MED ORDER — FLUDEOXYGLUCOSE F - 18 (FDG) INJECTION
6.7700 | Freq: Once | INTRAVENOUS | Status: AC | PRN
  Administered 2022-09-26: 6.77 via INTRAVENOUS

## 2022-09-30 ENCOUNTER — Telehealth: Payer: Self-pay

## 2022-09-30 NOTE — Telephone Encounter (Signed)
-----   Message from Rickard Patience sent at 09/29/2022  4:35 PM EDT ----- Please move her appt to next week. thanks

## 2022-10-06 ENCOUNTER — Encounter: Payer: Self-pay | Admitting: Oncology

## 2022-10-06 NOTE — Telephone Encounter (Signed)
Thank you :)

## 2022-10-06 NOTE — Telephone Encounter (Signed)
Morrie Sheldon can you follow up on this again please. End of next week's schedule is lighter.

## 2022-10-11 ENCOUNTER — Ambulatory Visit: Payer: Medicaid Other | Admitting: Family

## 2022-10-12 ENCOUNTER — Telehealth: Payer: Self-pay | Admitting: Oncology

## 2022-10-12 ENCOUNTER — Inpatient Hospital Stay: Payer: Medicaid Other

## 2022-10-12 ENCOUNTER — Inpatient Hospital Stay: Payer: Medicaid Other | Admitting: Oncology

## 2022-10-12 NOTE — Telephone Encounter (Signed)
Answering service sent message from patient to cancel her appt on 10/12/2022. She will call back to reschedule.

## 2022-10-13 ENCOUNTER — Encounter: Payer: Self-pay | Admitting: Oncology

## 2022-10-13 ENCOUNTER — Inpatient Hospital Stay: Payer: Medicaid Other | Admitting: Oncology

## 2022-10-13 ENCOUNTER — Inpatient Hospital Stay: Payer: Medicaid Other

## 2022-10-13 ENCOUNTER — Inpatient Hospital Stay: Payer: Medicaid Other | Attending: Oncology

## 2022-10-13 VITALS — BP 127/73 | HR 97 | Temp 96.7°F | Resp 18 | Ht 62.0 in | Wt 119.5 lb

## 2022-10-13 DIAGNOSIS — C50911 Malignant neoplasm of unspecified site of right female breast: Secondary | ICD-10-CM | POA: Insufficient documentation

## 2022-10-13 DIAGNOSIS — F17210 Nicotine dependence, cigarettes, uncomplicated: Secondary | ICD-10-CM | POA: Diagnosis not present

## 2022-10-13 DIAGNOSIS — Z720 Tobacco use: Secondary | ICD-10-CM | POA: Diagnosis not present

## 2022-10-13 DIAGNOSIS — Z95828 Presence of other vascular implants and grafts: Secondary | ICD-10-CM

## 2022-10-13 DIAGNOSIS — K6289 Other specified diseases of anus and rectum: Secondary | ICD-10-CM | POA: Diagnosis not present

## 2022-10-13 DIAGNOSIS — R918 Other nonspecific abnormal finding of lung field: Secondary | ICD-10-CM | POA: Insufficient documentation

## 2022-10-13 DIAGNOSIS — Z171 Estrogen receptor negative status [ER-]: Secondary | ICD-10-CM | POA: Diagnosis not present

## 2022-10-13 DIAGNOSIS — C20 Malignant neoplasm of rectum: Secondary | ICD-10-CM | POA: Insufficient documentation

## 2022-10-13 LAB — CBC WITH DIFFERENTIAL (CANCER CENTER ONLY)
Abs Immature Granulocytes: 0.04 10*3/uL (ref 0.00–0.07)
Basophils Absolute: 0 10*3/uL (ref 0.0–0.1)
Basophils Relative: 0 %
Eosinophils Absolute: 0.2 10*3/uL (ref 0.0–0.5)
Eosinophils Relative: 2 %
HCT: 37.9 % (ref 36.0–46.0)
Hemoglobin: 13.2 g/dL (ref 12.0–15.0)
Immature Granulocytes: 0 %
Lymphocytes Relative: 34 %
Lymphs Abs: 3.2 10*3/uL (ref 0.7–4.0)
MCH: 30.9 pg (ref 26.0–34.0)
MCHC: 34.8 g/dL (ref 30.0–36.0)
MCV: 88.8 fL (ref 80.0–100.0)
Monocytes Absolute: 0.7 10*3/uL (ref 0.1–1.0)
Monocytes Relative: 7 %
Neutro Abs: 5.3 10*3/uL (ref 1.7–7.7)
Neutrophils Relative %: 57 %
Platelet Count: 225 10*3/uL (ref 150–400)
RBC: 4.27 MIL/uL (ref 3.87–5.11)
RDW: 13.2 % (ref 11.5–15.5)
WBC Count: 9.5 10*3/uL (ref 4.0–10.5)
nRBC: 0 % (ref 0.0–0.2)

## 2022-10-13 LAB — CMP (CANCER CENTER ONLY)
ALT: 15 U/L (ref 0–44)
AST: 24 U/L (ref 15–41)
Albumin: 4.1 g/dL (ref 3.5–5.0)
Alkaline Phosphatase: 77 U/L (ref 38–126)
Anion gap: 12 (ref 5–15)
BUN: <5 mg/dL — ABNORMAL LOW (ref 8–23)
CO2: 25 mmol/L (ref 22–32)
Calcium: 9.2 mg/dL (ref 8.9–10.3)
Chloride: 92 mmol/L — ABNORMAL LOW (ref 98–111)
Creatinine: 0.89 mg/dL (ref 0.44–1.00)
GFR, Estimated: >60 mL/min (ref >60)
Glucose, Bld: 107 mg/dL — ABNORMAL HIGH (ref 70–99)
Potassium: 3.4 mmol/L — ABNORMAL LOW (ref 3.5–5.1)
Sodium: 129 mmol/L — ABNORMAL LOW (ref 135–145)
Total Bilirubin: 0.8 mg/dL (ref 0.3–1.2)
Total Protein: 6.9 g/dL (ref 6.5–8.1)

## 2022-10-13 MED ORDER — HEPARIN SOD (PORK) LOCK FLUSH 100 UNIT/ML IV SOLN
500.0000 [IU] | Freq: Once | INTRAVENOUS | Status: AC
  Administered 2022-10-13: 500 [IU] via INTRAVENOUS
  Filled 2022-10-13: qty 5

## 2022-10-13 MED ORDER — SODIUM CHLORIDE 0.9% FLUSH
10.0000 mL | INTRAVENOUS | Status: DC | PRN
  Administered 2022-10-13: 10 mL via INTRAVENOUS
  Filled 2022-10-13: qty 10

## 2022-10-13 NOTE — Assessment & Plan Note (Signed)
Clinical stage III triple negative right breast cancer. cT3 cN0-1 S/p  neoadjuvant carboplatin Taxol Keytruda followed by [facility] with Keytruda x 3.   4th cycle of AC with Rande Lawman was not given due to poor tolerance. Status post right mastectomy with sentinel lymph node biopsy. ypT2 ypN0, residual disease, Patient declines any adjuvant chemotherapy or immunotherapy Labs are reviewed and discussed with patient.  CT and PET scan results were reviewed with patient. Hypermetabolic lung nodule, gradually increasing in size.  Metastatic disease?  Primary lung malignancy or inflammation I will reach out to pulmonology to see if any of these nodules are amenable for biopsy to confirm metastasis.

## 2022-10-13 NOTE — Progress Notes (Signed)
Patient states that she has had shortness of breath for a while now.

## 2022-10-13 NOTE — Assessment & Plan Note (Signed)
She will get port flushed today.  Depending on her work up results, we will schedule additional port flush if medi port is not going to be used for treatment.

## 2022-10-13 NOTE — Assessment & Plan Note (Signed)
This could be acute inflammation or polyp/mass. He has never had any colonoscopy done in the past.   Referred to GI for further evaluation.

## 2022-10-13 NOTE — Progress Notes (Addendum)
Hematology/Oncology Progress note [phone] [phone]       CHIEF COMPLAINTS/REASON FOR VISIT:  Follow-up for triple negative right breast cancer.   ASSESSMENT & PLAN:   Cancer Staging  Invasive ductal carcinoma of right breast (HCC) Staging form: Breast, AJCC 8th Edition - Pathologic stage from 10/19/2021: No Stage Recommended (ypT2, pN0, cM0, G3, ER-, PR-, HER2-) - Signed by Rickard Patience, MD on 11/03/2021   Invasive ductal carcinoma of right breast (HCC) Clinical stage III triple negative right breast cancer. cT3 cN0-1 S/p  neoadjuvant carboplatin Taxol Keytruda followed by [facility] with Keytruda x 3.   4th cycle of AC with Rande Lawman was not given due to poor tolerance. Status post right mastectomy with sentinel lymph node biopsy. ypT2 ypN0, residual disease, Patient declines any adjuvant chemotherapy or immunotherapy Labs are reviewed and discussed with patient.  CT and PET scan results were reviewed with patient. Hypermetabolic lung nodule, gradually increasing in size.  Metastatic disease?  Primary lung malignancy or inflammation I will reach out to pulmonology to see if any of these nodules are amenable for biopsy to confirm metastasis.  Tobacco abuse Smoke cessation was discussed with patient.   Rectal mass This could be acute inflammation or polyp/mass. He has never had any colonoscopy done in the past.   Referred to GI for further evaluation.  Port-A-Cath in place She will get port flushed today.  Depending on her work up results, we will schedule additional port flush if medi port is not going to be used for treatment.    Orders Placed This Encounter  Procedures   Ambulatory referral to Gastroenterology    Referral Priority:   Urgent    Referral Type:   Consultation    Referral Reason:   Specialty Services Required    Referred to Provider:   Wyline Mood, MD    Number of Visits Requested:   1   Follow-up in TBD  All questions were answered. The  patient knows to call the clinic with any problems, questions or concerns.  Rickard Patience, MD, PhD [facility] Hematology Oncology 10/13/2022        HISTORY OF PRESENTING ILLNESS:   Yvonne Scott is a  62 y.o.  female presents for follow-up of triple negative breast cancer. Oncology History  Invasive ductal carcinoma of right breast (HCC)  11/11/2020 Genetic Testing   Genetic testing done at [facility].  Per note, negative.   02/20/2021 Initial Diagnosis   Invasive ductal carcinoma of right breast (HCC)\  -August 2022, patient was diagnosed with right breast stage IIIb cT3 N0M0, grade 2, ER negative, PR negative HER2 negative breast cancer.  Patient self palpated breast mass many years ago.  10/21/2020 diagnostic mammogram and ultrasound confirmed presence of mass and a subsequent biopsy revealed right breast triple negative cancer. There was plan for patient to start with neoadjuvant chemotherapy followed by surgery and radiation.  Unfortunately patient never started treatment.    Medi port was placed at [facility].  Established care with me on 02/19/2021    02/24/2021 Imaging   CT chest abdomen pelvis showed large right breast mass, 5.5 x 4.8 cm with small right axillary lymph node with variable degrees of enhancement potentially involved but not specific based on size.  This tracked down to retropectoral nodal stations largest lymph node along the lateral margin of the pectoralis major.  Tiny pulmonary nodules in the chest as discussed.  These are nonspecific but would consider short interval follow-up to assess for any changes.  No evidence of metastatic disease involving the abdomen or pelvis.  Aortic atherosclerosis.   02/25/2021 Echocardiogram   echocardiogram showed LVEF 65 to 70%.  Patient has been to chemotherapy class and understands antiemetics instructions   03/11/2021 - 08/26/2021 Chemotherapy   BREAST Pembrolizumab (200) D1 + Carboplatin (5) D1 + Paclitaxel (80) D1,8,15 q21d X 4 cycles,  followed by Pembrolizumab (200) D1 + AC D1 q21d x 3 cycles. 4th cycle was omitted due to poor tolerance.     03/16/2021 Imaging   bone scan showed no evidence of osseous metastatic breast cancer, asymmetric osseous uptake on the right at L5-S1, corresponding to degenerative disc disease on recent CT.  Asymmetric soft tissue uptake in the right breast   07/13/2021 Echocardiogram   Echocardiogram showed LVEF 60-65%.  Grade 1 diastolic dysfunction.  Refer details to echocardiogram reports.   10/19/2021 Surgery   Patient underwent right modified radical mastectomy  Pathology showed invasive mammary carcinoma, no special type, multifocal, with focal chondroid stroma.  Clip and biopsy sites identified.  12 lymph nodes negative for malignancy.  2 lymph nodes displaying dense fibrous scarring, compatible with pathological complete response.  Benign nipple/areola.   Grade 3, LVI not identified, DCIS not identified.  All margins negative for invasive carcinoma.   ypT2 ypN0  Comment Due to the presence of dense fibrosis and treatment effect in two of the sampled lymph nodes, pancytokeratin stains were performed on all submitted lymph nodes.  All lymph nodes are negative for residual metastatic carcinoma.  A separate focus of invasive carcinoma, measuring approximately 11 mm in greatest extent, and histologically similar to the primary tumor, is identified within sampling of the fibrous tissue adjacent to the primary tumor.    10/19/2021 Cancer Staging   Staging form: Breast, AJCC 8th Edition - Pathologic stage from 10/19/2021: No Stage Recommended (ypT2, pN0, cM0, G3, ER-, PR-, HER2-) - Signed by Rickard Patience, MD on 11/03/2021 Stage prefix: Post-therapy Response to neoadjuvant therapy: Partial response Multigene prognostic tests performed: None Histologic grading system: 3 grade system   05/27/2022 Imaging   CT chest abdomen pelvis with contrast showed 1. Interval right mastectomy with resolution of suspicious  right axillary nodes. 2. Enlargement of a 5 mm right lower lobe pulmonary nodule. Development of a 5 mm left upper lobe pulmonary nodule. Cannot exclude early metastatic disease. Consider chest CT follow-up at 3 months. 3. Otherwise, no evidence of metastatic disease in the chest, abdomen, or pelvis. 4. Age advanced coronary artery atherosclerosis. Recommend assessment of coronary risk factors. 5. Aortic atherosclerosis (ICD10-I70.0) and emphysema (ICD10-J43.9). 6. Mild enlargement of the common duct, which is now minimally dilated for age. Correlate with bilirubin levels. If elevated,consider ERCP.   09/09/2022 Imaging   CT chest without contrast showed 1. Interval growth of three solid bilateral pulmonary nodules since 05/25/2022 chest CT, largest 0.9 cm in the anteromedial left upper lobe, highly suspicious for pulmonary metastases. 2. No thoracic adenopathy. 3. Three-vessel coronary atherosclerosis. 4. Aortic Atherosclerosis (ICD10-I70.0) and Emphysema (ICD10-J43.9).     09/29/2022 Imaging   PET scan shows 1. Hypermetabolic bilateral pulmonary nodules, consistent with metastasis. 2. No other evidence of tracer avid metastatic disease, status post right mastectomy. 3. Hypermetabolism with suggestion of soft tissue fullness in the mid rectum, suspicious for polyp or carcinoma. Consider further evaluation with sigmoidoscopy. 4. Incidental findings, including: Aortic atherosclerosis (ICD10-I70.0), coronary artery atherosclerosis and emphysema (ICD10-J43.9).    07/16/2021-07/19/2021, patient was admitted for HCAP patient was treated with antibiotics 09/04/2021 - 09/08/2021 Patient was  admitted due to pancytopenia, thrush electrolyte abnormality.     INTERVAL HISTORY DALANI MOHL is a 62 y.o. female who has above history reviewed by me today presents for follow up visit for management of triple negative breast cancer Accompanied by mother CT and PET scan shows hypermetabolic lung  nodules increasing in size. Also rectal soft tissue mass with hypermetabolic activity. Denies hematochezia,  black tarry stool or abdominal pain, change of bowel habits.  Denies shortness of breath or cough. She continues smoking daily. She has gained weight    Review of Systems  Constitutional:  Positive for fatigue. Negative for appetite change, chills and fever.  HENT:   Negative for hearing loss, mouth sores and voice change.   Eyes:  Negative for eye problems.  Respiratory:  Negative for chest tightness and cough.   Cardiovascular:  Negative for chest pain.  Gastrointestinal:  Negative for abdominal distention, abdominal pain, blood in stool, diarrhea and nausea.  Endocrine: Negative for hot flashes.  Genitourinary:  Negative for difficulty urinating, dysuria and frequency.   Musculoskeletal:  Negative for arthralgias.  Skin:  Negative for itching and rash.  Neurological:  Positive for numbness. Negative for extremity weakness.  Hematological:  Negative for adenopathy.  Psychiatric/Behavioral:  Negative for confusion. The patient is not nervous/anxious.     MEDICAL HISTORY:  Past Medical History:  Diagnosis Date   Acute kidney injury (HCC)    Anxiety    Aortic atherosclerosis (HCC) 10/24/2016   Breast cancer (HCC)    Chemotherapy-induced neuropathy (HCC)    Essential hypertension    GERD (gastroesophageal reflux disease)    Hypokalemia    Hypomagnesemia    Invasive ductal carcinoma of right breast (HCC)    Metabolic acidosis    Personal history of chemotherapy    Pneumonia    Severe protein-calorie malnutrition (HCC)    Thrombocytopenia (HCC)     SURGICAL HISTORY: Past Surgical History:  Procedure Laterality Date   BREAST BIOPSY Right 2022   FOOT SURGERY Left    little toe correction   MASTECTOMY W/ SENTINEL NODE BIOPSY Right 10/19/2021   Procedure: MASTECTOMY WITH SENTINEL LYMPH NODE BIOPSY ( modified radical);  Surgeon: Carolan Shiver, MD;  Location:  [facility] ORS;  Service: General;  Laterality: Right;   PORTA CATH INSERTION  01/19/2021    SOCIAL HISTORY: Social History   Socioeconomic History   Marital status: Widowed    Spouse name: Not on file   Number of children: 1   Years of education: Not on file   Highest education level: Not on file  Occupational History   Not on file  Tobacco Use   Smoking status: Every Day    Current packs/day: 1.00    Average packs/day: 1 pack/day for 43.6 years (43.6 ttl pk-yrs)    Types: Cigarettes    Start date: 03/15/1979   Smokeless tobacco: Never  Vaping Use   Vaping status: Never Used  Substance and Sexual Activity   Alcohol use: No    Alcohol/week: 3.0 standard drinks of alcohol    Types: 3 Standard drinks or equivalent per week   Drug use: Not Currently   Sexual activity: Not Currently    Birth control/protection: None  Other Topics Concern   Not on file  Social History Narrative   Lives with mother   Social Determinants of Health   Financial Resource Strain: Low Risk  (01/06/2022)   Overall Financial Resource Strain (CARDIA)    Difficulty of Paying Living Expenses: Not very  hard  Food Insecurity: No Food Insecurity (01/06/2022)   Hunger Vital Sign    Worried About Running Out of Food in the Last Year: Never true    Ran Out of Food in the Last Year: Never true  Transportation Needs: No Transportation Needs (01/06/2022)   PRAPARE - Administrator, Civil Service (Medical): No    Lack of Transportation (Non-Medical): No  Physical Activity: Inactive (01/06/2022)   Exercise Vital Sign    Days of Exercise per Week: 0 days    Minutes of Exercise per Session: 0 min  Stress: Stress Concern Present (01/06/2022)   [employer] of Occupational Health - Occupational Stress Questionnaire    Feeling of Stress : Rather much  Social Connections: Moderately Isolated (01/06/2022)   Social Connection and Isolation Panel [NHANES]    Frequency of Communication with Friends and  Family: Once a week    Frequency of Social Gatherings with Friends and Family: Once a week    Attends Religious Services: 1 to 4 times per year    Active Member of [employer] or Organizations: Yes    Attends Banker Meetings: Never    Marital Status: Widowed  Intimate Partner Violence: Not At Risk (01/06/2022)   Humiliation, Afraid, Rape, and Kick questionnaire    Fear of Current or Ex-Partner: No    Emotionally Abused: No    Physically Abused: No    Sexually Abused: No    FAMILY HISTORY: Family History  Problem Relation Age of Onset   Cancer Mother 13       breast   Cancer Father        melonma   Cirrhosis Maternal Grandfather    Cancer Paternal Grandmother        skin/breast    ALLERGIES:  has No Known Allergies.  MEDICATIONS:  Current Outpatient Medications  Medication Sig Dispense Refill   Calcium Carb-Cholecalciferol (OYSTER SHELL CALCIUM W/D) 500-5 MG-MCG TABS Take by mouth.     calcium carbonate (TUMS EX) 750 MG chewable tablet Chew 2 tablets by mouth 4 (four) times daily as needed for heartburn.     ciclopirox (PENLAC) 8 % solution APPLY TOPICALLY AT BEDTIME 7 mL 3   fluconazole (DIFLUCAN) 100 MG tablet Take 1 tablet (100 mg total) by mouth daily. 5 tablet 0   fluticasone (FLONASE) 50 MCG/ACT nasal spray Place 1 spray into both nostrils daily.     furosemide (LASIX) 20 MG tablet TAKE 1 TABLET BY MOUTH ONCE DAILY IN THE MORNING FOR FLUID PILL.  ELEVATE FEET THROUGHOUT THE DAY, COMPRESSION SOCKS RECOMMENDED 30 tablet 0   ibuprofen (ADVIL) 200 MG tablet Take 200-400 mg by mouth every 6 (six) hours as needed for headache.     lidocaine-prilocaine (EMLA) cream Apply to affected area once 30 g 3   magnesium chloride (SLOW-MAG) 64 MG TBEC SR tablet Take 1 tablet (64 mg total) by mouth 2 (two) times daily. 60 tablet 2   omeprazole (PRILOSEC) 40 MG capsule Take 1 capsule by mouth once daily 90 capsule 0   potassium chloride (MICRO-K) 10 MEQ CR capsule TAKE 1 CAPSULE BY  MOUTH ONCE DAILY WITH FUROSEMIDE TO AVOID LOW POTASSIUM OR CRAMPS 30 capsule 0   predniSONE (DELTASONE) 20 MG tablet TAKE 2 TABLETS BY MOUTH ONCE DAILY WITH FOOD FOR 5 DAYS 10 tablet 0   simvastatin (ZOCOR) 20 MG tablet Take 1 tablet (20 mg total) by mouth every evening. 30 tablet 11   VENTOLIN HFA 108 (90 Base)  MCG/ACT inhaler INHALE 1 PUFF BY MOUTH EVERY 6 HOURS AS NEEDED OR SHORTNESS OF BREATH FOR WHEEZING 18 g 0   naloxone (NARCAN) nasal spray 4 mg/0.1 mL SMARTSIG:1 Both Nares Daily (Patient not taking: Reported on 01/25/2022)     No current facility-administered medications for this visit.   Facility-Administered Medications Ordered in Other Visits  Medication Dose Route Frequency Provider Last Rate Last Admin   famotidine (PEPCID) 20-0.9 MG/50ML-% IVPB            sodium chloride flush (NS) 0.9 % injection 10 mL  10 mL Intravenous PRN Rickard Patience, MD   10 mL at 10/13/22 1543     PHYSICAL EXAMINATION: ECOG PERFORMANCE STATUS: 1 - Symptomatic but completely ambulatory Vitals:   10/13/22 1513  BP: 127/73  Pulse: 97  Resp: 18  Temp: (!) 96.7 F (35.9 C)  SpO2: 100%   Filed Weights   10/13/22 1513  Weight: 119 lb 8 oz (54.2 kg)    Physical Exam Constitutional:      General: She is not in acute distress. HENT:     Head: Normocephalic and atraumatic.  Eyes:     General: No scleral icterus. Cardiovascular:     Rate and Rhythm: Normal rate.  Pulmonary:     Effort: Pulmonary effort is normal. No respiratory distress.  Abdominal:     General: Bowel sounds are normal. There is no distension.     Palpations: Abdomen is soft.  Musculoskeletal:        General: No deformity. Normal range of motion.     Cervical back: Normal range of motion and neck supple.  Skin:    Findings: No erythema or rash.  Neurological:     Mental Status: She is alert and oriented to person, place, and time. Mental status is at baseline.  Psychiatric:        Mood and Affect: Mood normal.       LABORATORY DATA:  I have reviewed the data as listed    Latest Ref Rng & Units 10/13/2022    3:01 PM 08/09/2022   10:05 AM 06/17/2022    1:49 PM  CBC  WBC 4.0 - 10.5 K/uL 9.5  7.7  8.4   Hemoglobin 12.0 - 15.0 g/dL 96.2  95.2  84.1   Hematocrit 36.0 - 46.0 % 37.9  38.9  41.6   Platelets 150 - 400 K/uL 225  237  272       Latest Ref Rng & Units 10/13/2022    3:02 PM 08/09/2022   10:06 AM 06/17/2022    1:49 PM  CMP  Glucose 70 - 99 mg/dL 324  401  027   BUN 8 - 23 mg/dL 5  9  12    Creatinine 0.44 - 1.00 mg/dL 2.53  6.64  4.03   Sodium 135 - 145 mmol/L 129  137  135   Potassium 3.5 - 5.1 mmol/L 3.4  3.6  4.4   Chloride 98 - 111 mmol/L 92  103  98   CO2 22 - 32 mmol/L 25  24  21    Calcium 8.9 - 10.3 mg/dL 9.2  9.3  47.4   Total Protein 6.5 - 8.1 g/dL 6.9  7.1  6.7   Total Bilirubin 0.3 - 1.2 mg/dL 0.8  0.5  0.3   Alkaline Phos 38 - 126 U/L 77  69  76   AST 15 - 41 U/L 24  18  16    ALT 0 - 44 U/L 15  12  10         RADIOGRAPHIC STUDIES: I have personally reviewed the radiological images as listed and agreed with the findings in the report. NM PET Image Initial (PI) Skull Base To Thigh  Result Date: 09/29/2022 CLINICAL DATA:  Initial treatment strategy for enlarging pulmonary nodules on chest CT. Right breast cancer. Right breast primary. Mastectomy and lymph node removal 1 year ago. Chemotherapy completed 1 month ago. Left chest port. Right arm pain. EXAM: NUCLEAR MEDICINE PET SKULL BASE TO THIGH TECHNIQUE: 6.8 mCi F-18 FDG was injected intravenously. Full-ring PET imaging was performed from the skull base to thigh after the radiotracer. CT data was obtained and used for attenuation correction and anatomic localization. Fasting blood glucose: 88 mg/dl COMPARISON:  Chest CT 69/62/9528. Most recent abdominopelvic CTs of 05/25/2022. FINDINGS: Mediastinal blood pool activity: SUV max 2.2 Liver activity: SUV max NA NECK: No areas of abnormal hypermetabolism. Incidental CT findings:  Bilateral carotid atherosclerosis. No cervical adenopathy. CHEST: No thoracic nodal hypermetabolism. Hypermetabolism corresponding to the previously detailed pulmonary nodules. Example anteromedial left upper lobe pulmonary nodule of 7 mm and a S.U.V. max of 4.2 on 54/4. Inferior right upper lobe pulmonary nodule of 5 mm and a S.U.V. max of 2.2 on 57/4. Right lower lobe 7 mm nodule of a S.U.V. max of 1.7 on 76/4. Incidental CT findings: Deferred to recent diagnostic CT. Aortic and coronary artery calcification. Mild centrilobular emphysema. ABDOMEN/PELVIS: No abdominopelvic nodal hypermetabolism. There is eccentric right mid rectal hypermetabolism with suggestion of soft tissue fullness. Example at a S.U.V. max of 16.5 on 131/4. Incidental CT findings: Aortic atherosclerosis. Normal adrenal glands. Pelvic floor laxity. SKELETON: No abnormal marrow activity. Incidental CT findings: Right mastectomy. IMPRESSION: 1. Hypermetabolic bilateral pulmonary nodules, consistent with metastasis. 2. No other evidence of tracer avid metastatic disease, status post right mastectomy. 3. Hypermetabolism with suggestion of soft tissue fullness in the mid rectum, suspicious for polyp or carcinoma. Consider further evaluation with sigmoidoscopy. 4. Incidental findings, including: Aortic atherosclerosis (ICD10-I70.0), coronary artery atherosclerosis and emphysema (ICD10-J43.9). Electronically Signed   By: Jeronimo Greaves M.D.   On: 09/29/2022 10:09

## 2022-10-13 NOTE — Assessment & Plan Note (Signed)
Smoke cessation was discussed with patient.

## 2022-10-17 ENCOUNTER — Telehealth: Payer: Self-pay

## 2022-10-17 NOTE — Telephone Encounter (Signed)
-----   Message from [facility] B sent at 10/17/2022  9:35 AM EDT ----- Good morning ladies/ Dr. Tobi Bastos stated that she needed to be seen son. Please help me add her in the schedule as Dr. Tobi Bastos doesn't have anything till October 2024..Thank you. ----- Message ----- From: Wyline Mood, MD Sent: 10/17/2022   8:33 AM EDT To: Adela Ports, CMA; Rickard Patience, MD  Kandis Cocking ,    Rectal mass - needs urgent visit- first available cancelllation or spot available with any one of [place] , she will need urgent colonoscopy after ----- Message ----- From: Rickard Patience, MD Sent: 10/13/2022   5:46 PM EDT To: Wyline Mood, MD  Hi Dr. Tobi Bastos,  I am referring this patient to you for evaluation hypermetabolic rectal soft tissue.  She has a history of triple negative breast cancer, initial treatment, declined maintenance therapy.  Recent CT scan shows growing lung nodules, so PET was obtained which showed incidental activity in a rectal soft tissue.  She is not super complaint patient but to she agrees with further work up. Her mother is involved.. you may call mother for appt information, especially if you can not get hold on her.  Thanks  Janyth Contes

## 2022-10-17 NOTE — Telephone Encounter (Signed)
Called and patient mother answered the phone which was [facility] and she was on patient DPR form. Patient mother states she can make the appointment for 10/24/2022

## 2022-10-17 NOTE — Telephone Encounter (Signed)
Please advise if you can see patient anytime soon?

## 2022-10-21 ENCOUNTER — Ambulatory Visit: Payer: Medicaid Other | Admitting: Cardiology

## 2022-10-21 ENCOUNTER — Other Ambulatory Visit: Payer: Self-pay

## 2022-10-24 ENCOUNTER — Other Ambulatory Visit: Payer: Self-pay

## 2022-10-24 ENCOUNTER — Encounter: Payer: Self-pay | Admitting: Gastroenterology

## 2022-10-24 ENCOUNTER — Ambulatory Visit: Payer: Medicaid Other | Admitting: Gastroenterology

## 2022-10-24 VITALS — BP 130/81 | HR 94 | Temp 98.1°F | Ht 62.0 in | Wt 120.5 lb

## 2022-10-24 DIAGNOSIS — R948 Abnormal results of function studies of other organs and systems: Secondary | ICD-10-CM

## 2022-10-24 DIAGNOSIS — Z720 Tobacco use: Secondary | ICD-10-CM | POA: Diagnosis not present

## 2022-10-24 MED ORDER — NA SULFATE-K SULFATE-MG SULF 17.5-3.13-1.6 GM/177ML PO SOLN: 354.0000 mL | Freq: Once | ORAL | 0 refills | Status: AC

## 2022-10-24 NOTE — Progress Notes (Signed)
Arlyss Repress, MD [address]  [address]  [place], [place] [address]  Main: [phone]  Fax: [phone]    Gastroenterology Consultation  Referring Provider:     Orson Eva, NP Primary Care Physician:  Orson Eva, NP Primary Gastroenterologist:  Dr. Arlyss Repress Reason for Consultation: Abnormal PET rectum        HPI:   Yvonne Scott is a 62 y.o. female referred by Orson Eva, NP  for consultation & management of abnormal PET scan that was performed on 09/29/2022 by patient's oncologist, Dr. Cathie Hoops.  She underwent PET scan as part of surveillance protocol for her right breast cancer.  She was found to have hypermetabolic lung nodule, gradually increasing in size concern for metastatic disease or primary lung malignancy or inflammation.  Patient will be seeing pulmonologist.  She was also found to have hypermetabolic soft tissue fullness in the mid rectum, suspicious for polyp or carcinoma.  Therefore, referred to GI for further evaluation.  Patient has chronic tobacco use, never had an colonoscopy.  She reports that few months ago, she had rectal bleeding in setting of constipation and she thought it was secondary to hemorrhoids.  Patient is accompanied by her mom today.  She denies any GI symptoms currently.  No history of anemia  NSAIDs: None  Antiplts/Anticoagulants/Anti thrombotics: None  GI Procedures: None  Past Medical History:  Diagnosis Date   Acute kidney injury (HCC)    Anxiety    Aortic atherosclerosis (HCC) 10/24/2016   Breast cancer (HCC)    Chemotherapy-induced neuropathy (HCC)    Essential hypertension    GERD (gastroesophageal reflux disease)    Hypokalemia    Hypomagnesemia    Invasive ductal carcinoma of right breast (HCC)    Metabolic acidosis    Personal history of chemotherapy    Pneumonia    Severe protein-calorie malnutrition (HCC)    Thrombocytopenia (HCC)     Past Surgical History:  Procedure Laterality Date    BREAST BIOPSY Right 2022   FOOT SURGERY Left    little toe correction   MASTECTOMY W/ SENTINEL NODE BIOPSY Right 10/19/2021   Procedure: MASTECTOMY WITH SENTINEL LYMPH NODE BIOPSY ( modified radical);  Surgeon: Carolan Shiver, MD;  Location: [facility] ORS;  Service: General;  Laterality: Right;   PORTA CATH INSERTION  01/19/2021     Current Outpatient Medications:    calcium carbonate (TUMS EX) 750 MG chewable tablet, Chew 2 tablets by mouth 4 (four) times daily as needed for heartburn., Disp: , Rfl:    cetirizine (ZYRTEC) 10 MG tablet, Take 10 mg by mouth at bedtime., Disp: , Rfl:    ciclopirox (PENLAC) 8 % solution, APPLY TOPICALLY AT BEDTIME, Disp: 7 mL, Rfl: 3   fluticasone (FLONASE) 50 MCG/ACT nasal spray, Place 1 spray into both nostrils daily., Disp: , Rfl:    furosemide (LASIX) 20 MG tablet, TAKE 1 TABLET BY MOUTH ONCE DAILY IN THE MORNING FOR FLUID PILL.  ELEVATE FEET THROUGHOUT THE DAY, COMPRESSION SOCKS RECOMMENDED, Disp: 30 tablet, Rfl: 0   ibuprofen (ADVIL) 200 MG tablet, Take 200-400 mg by mouth every 6 (six) hours as needed for headache., Disp: , Rfl:    lidocaine-prilocaine (EMLA) cream, Apply to affected area once, Disp: 30 g, Rfl: 3   magnesium chloride (SLOW-MAG) 64 MG TBEC SR tablet, Take 1 tablet (64 mg total) by mouth 2 (two) times daily., Disp: 60 tablet, Rfl: 2   Na Sulfate-K Sulfate-Mg Sulf 17.5-3.13-1.6 GM/177ML SOLN, Take 354  mLs by mouth once for 1 dose., Disp: 354 mL, Rfl: 0   naloxone (NARCAN) nasal spray 4 mg/0.1 mL, , Disp: , Rfl:    omeprazole (PRILOSEC) 40 MG capsule, Take 1 capsule by mouth once daily, Disp: 90 capsule, Rfl: 0   VENTOLIN HFA 108 (90 Base) MCG/ACT inhaler, INHALE 1 PUFF BY MOUTH EVERY 6 HOURS AS NEEDED OR SHORTNESS OF BREATH FOR WHEEZING, Disp: 18 g, Rfl: 0   Calcium Carb-Cholecalciferol (OYSTER SHELL CALCIUM W/D) 500-5 MG-MCG TABS, Take by mouth. (Patient not taking: Reported on 10/24/2022), Disp: , Rfl:    potassium chloride (MICRO-K) 10 MEQ  CR capsule, TAKE 1 CAPSULE BY MOUTH ONCE DAILY WITH FUROSEMIDE TO AVOID LOW POTASSIUM OR CRAMPS (Patient not taking: Reported on 10/24/2022), Disp: 30 capsule, Rfl: 0   Vitamin D, Ergocalciferol, (DRISDOL) 1.25 MG (50000 UNIT) CAPS capsule, Take 50,000 Units by mouth once a week. (Patient not taking: Reported on 10/24/2022), Disp: , Rfl:  No current facility-administered medications for this visit.  Facility-Administered Medications Ordered in Other Visits:    famotidine (PEPCID) 20-0.9 MG/50ML-% IVPB, , , ,    Family History  Problem Relation Age of Onset   Cancer Mother 32       breast   Cancer Father        melonma   Cirrhosis Maternal Grandfather    Cancer Paternal Grandmother        skin/breast     Social History   Tobacco Use   Smoking status: Every Day    Current packs/day: 1.00    Average packs/day: 1 pack/day for 43.6 years (43.6 ttl pk-yrs)    Types: Cigarettes    Start date: 03/15/1979   Smokeless tobacco: Never  Vaping Use   Vaping status: Never Used  Substance Use Topics   Alcohol use: No    Alcohol/week: 3.0 standard drinks of alcohol    Types: 3 Standard drinks or equivalent per week   Drug use: Not Currently    Allergies as of 10/24/2022   (No Known Allergies)    Review of Systems:    All systems reviewed and negative except where noted in HPI.   Physical Exam:  BP 130/81 (BP Location: Left Arm, Patient Position: Sitting, Cuff Size: Normal)   Pulse 94   Temp 98.1 F (36.7 C) (Oral)   Ht 5\' 2"  (1.575 m)   Wt 120 lb 8 oz (54.7 kg)   LMP 11/19/2002 (Approximate)   BMI 22.04 kg/m  Patient's last menstrual period was 11/19/2002 (approximate).  General:   Alert,  Well-developed, well-nourished, pleasant and cooperative in NAD Head:  Normocephalic and atraumatic. Eyes:  Sclera clear, no icterus.   Conjunctiva pink. Ears:  Normal auditory acuity. Nose:  No deformity, discharge, or lesions. Mouth:  No deformity or lesions,oropharynx pink & moist. Neck:   Supple; no masses or thyromegaly. Lungs:  Respirations even and unlabored.  Clear throughout to auscultation.   No wheezes, crackles, or rhonchi. No acute distress. Heart:  Regular rate and rhythm; no murmurs, clicks, rubs, or gallops. Abdomen:  Normal bowel sounds. Soft, non-tender and non-distended without masses, hepatosplenomegaly or hernias noted.  No guarding or rebound tenderness.   Rectal: Not performed Msk:  Symmetrical without gross deformities. Good, equal movement & strength bilaterally. Pulses:  Normal pulses noted. Extremities:  No clubbing or edema.  No cyanosis. Neurologic:  Alert and oriented x3;  grossly normal neurologically. Skin:  Intact without significant lesions or rashes. No jaundice. Psych:  Alert and cooperative.  Normal mood and affect.  Imaging Studies: Reviewed  Assessment and Plan:   Yvonne Scott is a 62 y.o. Caucasian female with history of chronic tobacco use, right breast cancer status post right mastectomy with sentinel lymph node biopsy, s/p neoadjuvant chemo.  Recent PET scan revealed hypermetabolism, representing soft tissue fullness in mid rectum  Discussed with patient regarding colonoscopy and she is agreeable  I have discussed alternative options, risks & benefits,  which include, but are not limited to, bleeding, infection, perforation,respiratory complication & drug reaction.  The patient agrees with this plan & written consent will be obtained.      Follow up based on the above workup   Arlyss Repress, MD

## 2022-10-25 ENCOUNTER — Telehealth: Payer: Self-pay

## 2022-10-25 NOTE — Telephone Encounter (Signed)
Spoke with pt , pt states she has to talk to her Child psychotherapist and will let [facility] know if she wants to change date or give me new insurance information on 11-13-2022 and keep appt time.

## 2022-10-25 NOTE — Telephone Encounter (Signed)
Per [employer] pt insurance will expire on 11-12-2022. I left message for pt to return call to me to let me know if she wanted to try change the date of her procedure and /or give me new information when she received her new insurance .

## 2022-10-26 ENCOUNTER — Other Ambulatory Visit: Payer: Self-pay

## 2022-10-26 DIAGNOSIS — R911 Solitary pulmonary nodule: Secondary | ICD-10-CM

## 2022-11-06 ENCOUNTER — Other Ambulatory Visit: Payer: Self-pay | Admitting: Family

## 2022-11-06 ENCOUNTER — Other Ambulatory Visit: Payer: Self-pay | Admitting: Oncology

## 2022-11-06 DIAGNOSIS — C50911 Malignant neoplasm of unspecified site of right female breast: Secondary | ICD-10-CM

## 2022-11-09 ENCOUNTER — Encounter: Payer: Self-pay | Admitting: Oncology

## 2022-11-14 ENCOUNTER — Emergency Department
Admission: EM | Admit: 2022-11-14 | Discharge: 2022-11-14 | Disposition: A | Discharge status: Home / Self Care | Payer: Medicaid Other | Attending: Emergency Medicine | Admitting: Emergency Medicine

## 2022-11-14 ENCOUNTER — Other Ambulatory Visit: Payer: Self-pay

## 2022-11-14 ENCOUNTER — Encounter: Payer: Self-pay | Admitting: Emergency Medicine

## 2022-11-14 DIAGNOSIS — W010XXA Fall on same level from slipping, tripping and stumbling without subsequent striking against object, initial encounter: Secondary | ICD-10-CM | POA: Insufficient documentation

## 2022-11-14 DIAGNOSIS — S81011A Laceration without foreign body, right knee, initial encounter: Secondary | ICD-10-CM | POA: Insufficient documentation

## 2022-11-14 DIAGNOSIS — S80911A Unspecified superficial injury of right knee, initial encounter: Secondary | ICD-10-CM | POA: Diagnosis present

## 2022-11-14 MED ORDER — LIDOCAINE-EPINEPHRINE (PF) 2 %-1:200000 IJ SOLN
20.0000 mL | Freq: Once | INTRAMUSCULAR | Status: AC
  Administered 2022-11-14: 20 mL via INTRADERMAL
  Filled 2022-11-14: qty 20

## 2022-11-14 MED ORDER — TRAMADOL HCL 50 MG PO TABS: 50.0000 mg | ORAL_TABLET | Freq: Four times a day (QID) | ORAL | 0 refills | Status: DC | PRN

## 2022-11-14 NOTE — ED Provider Notes (Signed)
[facility] Provider Note    Event Date/Time   First MD Initiated Contact with Patient 11/14/22 1629     (approximate)   History   Knee Injury   HPI  Yvonne Scott is a 62 y.o. female with extensive past medical history including cancer, actively being treated presents today after fall with right knee injury.  Patient reports he tripped on her shoes and fell forward onto concrete injuring her right knee, no other injuries.  She is able to ambulate well.     Physical Exam   Triage Vital Signs: ED Triage Vitals [11/14/22 1623]  Encounter Vitals Group     BP (!) 88/54     Systolic BP Percentile      Diastolic BP Percentile      Pulse Rate (!) 107     Resp 20     Temp 97.8 F (36.6 C)     Temp Source Oral     SpO2 92 %     Weight 54.4 kg (120 lb)     Height 1.575 m (5\' 2" )     Head Circumference      Peak Flow      Pain Score 8     Pain Loc      Pain Education      Exclude from Growth Chart     Most recent vital signs: Vitals:   11/14/22 1625 11/14/22 1716  BP: (!) 98/57 102/60  Pulse:  100  Resp:  18  Temp:    SpO2:  94%     General: Awake, no distress.  CV:  Good peripheral perfusion.  Resp:  Normal effort.  Abd:  No distention.  Other:  Laceration to the right knee, curvilinear, no bone exposed   ED Results / Procedures / Treatments   Labs (all labs ordered are listed, but only abnormal results are displayed) Labs Reviewed - No data to display   EKG     RADIOLOGY     PROCEDURES:  Critical Care performed:   Marland KitchenMarland KitchenLaceration Repair  Date/Time: 11/14/2022 5:17 PM  Performed by: Jene Every, MD Authorized by: Jene Every, MD   Consent:    Consent obtained:  Verbal   Consent given by:  Patient   Risks discussed:  Infection, pain, poor cosmetic result and poor wound healing Universal protocol:    Patient identity confirmed:  Verbally with patient Anesthesia:    Anesthesia method:  Local infiltration    Local anesthetic:  Lidocaine 1% WITH epi Laceration details:    Location:  Leg   Leg location:  R knee   Length (cm):  4 Pre-procedure details:    Preparation:  Patient was prepped and draped in usual sterile fashion Exploration:    Limited defect created (wound extended): no     Wound extent: no tendon damage, no underlying fracture and no vascular damage   Treatment:    Area cleansed with:  Povidone-iodine and saline   Amount of cleaning:  Extensive Skin repair:    Repair method:  Sutures   Suture size:  4-0   Suture material:  Nylon   Suture technique:  Horizontal mattress and simple interrupted   Number of sutures:  4 Approximation:    Approximation:  Close Repair type:    Repair type:  Intermediate Post-procedure details:    Dressing:  Sterile dressing   Procedure completion:  Tolerated well, no immediate complications    MEDICATIONS ORDERED IN ED: Medications  lidocaine-EPINEPHrine (XYLOCAINE W/EPI)  2 %-1:200000 (PF) injection 20 mL (20 mLs Intradermal Given by Other 11/14/22 1642)     IMPRESSION / MDM / ASSESSMENT AND PLAN / ED COURSE  I reviewed the triage vital signs and the nursing notes. Patient's presentation is most consistent with acute, uncomplicated illness.  Patient presents after mechanical fall with right knee injury with laceration requiring repair.  Initial blood pressure was low however patient reports this is typical for her given her treatment regimen.  We offered IV fluids but she declined, she states that she can drink water while here.  Does not want blood work or other diagnostics at this time.  Wound repaired, suture removal in 7 to 10 days        FINAL CLINICAL IMPRESSION(S) / ED DIAGNOSES   Final diagnoses:  Laceration of right knee, initial encounter     Rx / DC Orders   ED Discharge Orders          Ordered    traMADol (ULTRAM) 50 MG tablet  Every 6 hours PRN        11/14/22 1712             Note:  This document  was prepared using Dragon voice recognition software and may include unintentional dictation errors.   Jene Every, MD 11/14/22 [phone]

## 2022-11-14 NOTE — ED Triage Notes (Signed)
Patient tripped and fell due to shoes being too big. Patient has skin tear to right knee- bleeding controlled. Patient repeatedly saying "I don't think this is necessary."

## 2022-11-16 ENCOUNTER — Ambulatory Visit: Payer: Medicaid Other | Admitting: Oncology

## 2022-11-16 ENCOUNTER — Other Ambulatory Visit: Payer: Medicaid Other

## 2022-11-21 ENCOUNTER — Ambulatory Visit: Payer: Medicaid Other | Admitting: Student in an Organized Health Care Education/Training Program

## 2022-11-21 ENCOUNTER — Encounter: Payer: Self-pay | Admitting: Student in an Organized Health Care Education/Training Program

## 2022-11-21 VITALS — BP 138/80 | HR 101 | Temp 97.7°F | Ht 62.0 in | Wt 124.8 lb

## 2022-11-21 DIAGNOSIS — R911 Solitary pulmonary nodule: Secondary | ICD-10-CM | POA: Diagnosis not present

## 2022-11-21 DIAGNOSIS — R0602 Shortness of breath: Secondary | ICD-10-CM | POA: Diagnosis not present

## 2022-11-21 DIAGNOSIS — C50911 Malignant neoplasm of unspecified site of right female breast: Secondary | ICD-10-CM

## 2022-11-21 MED ORDER — AZITHROMYCIN 250 MG PO TABS
ORAL_TABLET | ORAL | 0 refills | Status: AC
Start: 2022-11-21 — End: 2022-11-26

## 2022-11-21 MED ORDER — PREDNISONE 20 MG PO TABS
20.0000 mg | ORAL_TABLET | Freq: Every day | ORAL | 0 refills | Status: DC
Start: 2022-11-21 — End: 2023-02-28

## 2022-11-21 NOTE — Progress Notes (Signed)
Synopsis: Referred in for pulmonary nodule by Orson Eva, NP  Assessment & Plan:   #Lung nodules #History of Breast Cancer (triple negative)  Patient is presenting for the evaluation of bilateral pulmonary nodules (right upper lobe and lingula) that have shown growth on serial imaging with FDG avidity.  This is highly concerning for a metastatic process from her breast cancer and she is referred for consideration of biopsy.  We discussed the importance of diagnosis and staging in lung malignancies, and the approach to obtaining a tissue diagnosis which would include robotic assisted navigational bronchoscopy with endobronchial ultrasound guided sampling.  We also discussed the risks associated with the procedure which include a 2% risk of pneumothorax, infection, bleeding, and nondiagnostic procedure in detail.  I explained that patients typically are able to return home the same day of the procedure, but in rare cases admission to the hospital for observation and treatment is required.  After our discussion, the patient elected to defer procedure until after her planned colonoscopy.  I did offer the patient 12/01/2022 for navigational bronchoscopy but she would prefer to get it done in early October.  We will await her colonoscopy prior to scheduling her for robotic assisted navigational bronchoscopy  -Will need robotic navigation with biopsy of lymph nodes in October 2024   #Shortness of breath  Reports shortness of breath of 1 month in duration with an associated scant cough but no wheezing on exam and no other signs or symptoms that would suggest COPD exacerbation.  That said, she does have a significant smoking history with emphysema on imaging.  I will treat for mild COPD exacerbation with a short course of low-dose prednisone and a course of azithromycin.  I will also order pulmonary function testing to assess for COPD.  - predniSONE (DELTASONE) 20 MG tablet; Take 1 tablet (20 mg  total) by mouth daily with breakfast for 5 days.  Dispense: 5 tablet; Refill: 0 - azithromycin (ZITHROMAX) 250 MG tablet; Take 2 tablets (500 mg) on  Day 1,  followed by 1 tablet (250 mg) once daily on Days 2 through 5.  Dispense: 6 each; Refill: 0 - Pulmonary Function Test [facility] Only; Future   Return in about 2 months (around 01/21/2023).  I spent 60 minutes caring for this patient today, including preparing to see the patient, obtaining a medical history , reviewing a separately obtained history, performing a medically appropriate examination and/or evaluation, counseling and educating the patient/family/caregiver, ordering medications, tests, or procedures, documenting clinical information in the electronic health record, and independently interpreting results (not separately reported/billed) and communicating results to the patient/family/caregiver  Raechel Chute, MD [facility] 11/21/2022 11:26 AM    End of visit medications:  Meds ordered this encounter  Medications   predniSONE (DELTASONE) 20 MG tablet    Sig: Take 1 tablet (20 mg total) by mouth daily with breakfast for 5 days.    Dispense:  5 tablet    Refill:  0   azithromycin (ZITHROMAX) 250 MG tablet    Sig: Take 2 tablets (500 mg) on  Day 1,  followed by 1 tablet (250 mg) once daily on Days 2 through 5.    Dispense:  6 each    Refill:  0     Current Outpatient Medications:    azithromycin (ZITHROMAX) 250 MG tablet, Take 2 tablets (500 mg) on  Day 1,  followed by 1 tablet (250 mg) once daily on Days 2 through 5., Disp: 6 each, Rfl:  0   Calcium Carb-Cholecalciferol (OYSTER SHELL CALCIUM W/D) 500-5 MG-MCG TABS, Take by mouth., Disp: , Rfl:    calcium carbonate (TUMS EX) 750 MG chewable tablet, Chew 2 tablets by mouth 4 (four) times daily as needed for heartburn., Disp: , Rfl:    cetirizine (ZYRTEC) 10 MG tablet, Take 10 mg by mouth at bedtime., Disp: , Rfl:    fluticasone (FLONASE) 50 MCG/ACT nasal spray,  Place 1 spray into both nostrils daily., Disp: , Rfl:    ibuprofen (ADVIL) 200 MG tablet, Take 200-400 mg by mouth every 6 (six) hours as needed for headache., Disp: , Rfl:    lidocaine-prilocaine (EMLA) cream, APPLY TO AFFECTED AREA ONCE, Disp: 30 g, Rfl: 0   magnesium chloride (SLOW-MAG) 64 MG TBEC SR tablet, Take 1 tablet (64 mg total) by mouth 2 (two) times daily., Disp: 60 tablet, Rfl: 2   naloxone (NARCAN) nasal spray 4 mg/0.1 mL, , Disp: , Rfl:    omeprazole (PRILOSEC) 40 MG capsule, Take 1 capsule by mouth once daily, Disp: 90 capsule, Rfl: 0   potassium chloride (MICRO-K) 10 MEQ CR capsule, TAKE 1 CAPSULE BY MOUTH ONCE DAILY WITH FUROSEMIDE TO AVOID LOW POTASSIUM OR CRAMPS, Disp: 30 capsule, Rfl: 0   predniSONE (DELTASONE) 20 MG tablet, Take 1 tablet (20 mg total) by mouth daily with breakfast for 5 days., Disp: 5 tablet, Rfl: 0   traMADol (ULTRAM) 50 MG tablet, Take 1 tablet (50 mg total) by mouth every 6 (six) hours as needed., Disp: 20 tablet, Rfl: 0   VENTOLIN HFA 108 (90 Base) MCG/ACT inhaler, INHALE 1 PUFF BY MOUTH EVERY 6 HOURS AS NEEDED OR SHORTNESS OF BREATH FOR WHEEZING, Disp: 18 g, Rfl: 0   Vitamin D, Ergocalciferol, (DRISDOL) 1.25 MG (50000 UNIT) CAPS capsule, Take 50,000 Units by mouth once a week., Disp: , Rfl:    furosemide (LASIX) 20 MG tablet, TAKE 1 TABLET BY MOUTH ONCE DAILY IN THE MORNING FOR FLUID PILL.  ELEVATE FEET THROUGHOUT THE DAY, COMPRESSION SOCKS RECOMMENDED (Patient not taking: Reported on 11/21/2022), Disp: 30 tablet, Rfl: 0 No current facility-administered medications for this visit.  Facility-Administered Medications Ordered in Other Visits:    famotidine (PEPCID) 20-0.9 MG/50ML-% IVPB, , , ,    Subjective:   PATIENT ID: Yvonne Scott GENDER: female DOB: October 21, 1960, MRN: [id number]  Chief Complaint  Patient presents with   pulmonary consult    PET 09/29/22. Patient reports shortness of breath, cough, and wheezing.     HPI  Patient is a pleasant  62 year old female presenting to clinic for evaluation of pulmonary nodules.  She is also reporting shortness of breath.  Patient has a history of breast cancer for which she is followed by Dr. Cathie Hoops from oncology.  She was last seen in August 2024 following surveillance imaging in July (CT scan of the chest and PET/CT) showing growth in the pulmonary nodules in the right upper lobe and lingula.  Patient follows with Dr. Cathie Hoops for stage III triple negative right breast cancer status post neoadjuvant carboplatin, Taxol, and Keytruda followed by adjuvant chemotherapy with [facility] which she reported tolerated.  She is status post right mastectomy with sentinel lymph node biopsy with residual disease.  The CT scan of the chest and showed enlarging right upper lobe and lingular pulmonary nodules which were both PET avid for which she is currently referred for consideration of biopsy.  Imaging was also noted for a possible rectal mass for which she was referred to GI and  is scheduled for colonoscopy on 12/07/2022.  Today, patient is also reporting increasing shortness of breath as well as a cannot cough.  She does not have any chest pain, chest tightness, sputum production, hemoptysis, or wheezing.  She describes symptoms as being similar to a prior episode that benefited from prednisone and antibiotics.  The symptoms have been ongoing for around a month now.  Patient is a current smoker and is smoking 1 pack/day with around 40 to 50 pack years of smoking history.  She does not have the will to quit at this point.  She does not report any significant occupational exposures. Patient's past medical history is notable for DKA in 2021 (elevated Beta-Hydroxybutyric acid on admission).  Ancillary information including prior medications, full medical/surgical/family/social histories, and PFTs (when available) are listed below and have been reviewed.   Review of Systems  Constitutional:  Negative for chills, fever,  malaise/fatigue and weight loss.  Respiratory:  Positive for shortness of breath. Negative for cough, hemoptysis, sputum production and wheezing.   Cardiovascular:  Negative for chest pain.     Objective:   Vitals:   11/21/22 1100  BP: 138/80  Pulse: (!) 101  Temp: 97.7 F (36.5 C)  TempSrc: Temporal  SpO2: 99%  Weight: 124 lb 12.8 oz (56.6 kg)  Height: 5\' 2"  (1.575 m)   99% on RA  BMI Readings from Last 3 Encounters:  11/21/22 22.83 kg/m  11/14/22 21.95 kg/m  10/24/22 22.04 kg/m   Wt Readings from Last 3 Encounters:  11/21/22 124 lb 12.8 oz (56.6 kg)  11/14/22 120 lb (54.4 kg)  10/24/22 120 lb 8 oz (54.7 kg)    Physical Exam Constitutional:      Appearance: Normal appearance. She is normal weight.  HENT:     Mouth/Throat:     Mouth: Mucous membranes are moist.  Cardiovascular:     Rate and Rhythm: Normal rate and regular rhythm.     Pulses: Normal pulses.     Heart sounds: Normal heart sounds.  Pulmonary:     Effort: Pulmonary effort is normal. No respiratory distress.     Breath sounds: Normal breath sounds. No wheezing, rhonchi or rales.  Neurological:     General: No focal deficit present.     Mental Status: She is alert and oriented to person, place, and time. Mental status is at baseline.       Ancillary Information    Past Medical History:  Diagnosis Date   Acute kidney injury (HCC)    Anxiety    Aortic atherosclerosis (HCC) 10/24/2016   Breast cancer (HCC)    Chemotherapy-induced neuropathy (HCC)    Essential hypertension    GERD (gastroesophageal reflux disease)    Hypokalemia    Hypomagnesemia    Invasive ductal carcinoma of right breast (HCC)    Metabolic acidosis    Personal history of chemotherapy    Pneumonia    Severe protein-calorie malnutrition (HCC)    Thrombocytopenia (HCC)      Family History  Problem Relation Age of Onset   Cancer Mother 7       breast   Cancer Father        melonma   Cirrhosis Maternal  Grandfather    Cancer Paternal Grandmother        skin/breast     Past Surgical History:  Procedure Laterality Date   BREAST BIOPSY Right 2022   FOOT SURGERY Left    little toe correction   MASTECTOMY W/ SENTINEL NODE  BIOPSY Right 10/19/2021   Procedure: MASTECTOMY WITH SENTINEL LYMPH NODE BIOPSY ( modified radical);  Surgeon: Carolan Shiver, MD;  Location: [facility] ORS;  Service: General;  Laterality: Right;   PORTA CATH INSERTION  01/19/2021    Social History   Socioeconomic History   Marital status: Widowed    Spouse name: Not on file   Number of children: 1   Years of education: Not on file   Highest education level: Not on file  Occupational History   Not on file  Tobacco Use   Smoking status: Every Day    Current packs/day: 1.00    Average packs/day: 1 pack/day for 43.7 years (43.7 ttl pk-yrs)    Types: Cigarettes    Start date: 03/15/1979   Smokeless tobacco: Never  Vaping Use   Vaping status: Never Used  Substance and Sexual Activity   Alcohol use: No    Alcohol/week: 3.0 standard drinks of alcohol    Types: 3 Standard drinks or equivalent per week   Drug use: Not Currently   Sexual activity: Not Currently    Birth control/protection: None  Other Topics Concern   Not on file  Social History Narrative   Lives with mother   Social Determinants of Health   Financial Resource Strain: Low Risk  (01/06/2022)   Overall Financial Resource Strain (CARDIA)    Difficulty of Paying Living Expenses: Not very hard  Food Insecurity: No Food Insecurity (01/06/2022)   Hunger Vital Sign    Worried About Running Out of Food in the Last Year: Never true    Ran Out of Food in the Last Year: Never true  Transportation Needs: No Transportation Needs (01/06/2022)   PRAPARE - Administrator, Civil Service (Medical): No    Lack of Transportation (Non-Medical): No  Physical Activity: Inactive (01/06/2022)   Exercise Vital Sign    Days of Exercise per Week: 0 days     Minutes of Exercise per Session: 0 min  Stress: Stress Concern Present (01/06/2022)   [employer] of Occupational Health - Occupational Stress Questionnaire    Feeling of Stress : Rather much  Social Connections: Moderately Isolated (01/06/2022)   Social Connection and Isolation Panel [NHANES]    Frequency of Communication with Friends and Family: Once a week    Frequency of Social Gatherings with Friends and Family: Once a week    Attends Religious Services: 1 to 4 times per year    Active Member of [employer] or Organizations: Yes    Attends Banker Meetings: Never    Marital Status: Widowed  Intimate Partner Violence: Not At Risk (01/06/2022)   Humiliation, Afraid, Rape, and Kick questionnaire    Fear of Current or Ex-Partner: No    Emotionally Abused: No    Physically Abused: No    Sexually Abused: No     No Known Allergies   CBC    Component Value Date/Time   WBC 9.5 10/13/2022 1501   WBC 10.1 04/27/2022 1102   RBC 4.27 10/13/2022 1501   HGB 13.2 10/13/2022 1501   HGB 13.9 06/17/2022 1349   HCT 37.9 10/13/2022 1501   HCT 41.6 06/17/2022 1349   PLT 225 10/13/2022 1501   PLT 272 06/17/2022 1349   MCV 88.8 10/13/2022 1501   MCV 91 06/17/2022 1349   MCV 100 02/17/2014 0323   MCH 30.9 10/13/2022 1501   MCHC 34.8 10/13/2022 1501   RDW 13.2 10/13/2022 1501   RDW 14.5 06/17/2022 1349  RDW 14.1 02/17/2014 0323   LYMPHSABS 3.2 10/13/2022 1501   LYMPHSABS 1.8 06/17/2022 1349   LYMPHSABS 3.2 02/17/2014 0323   MONOABS 0.7 10/13/2022 1501   MONOABS 0.4 02/17/2014 0323   EOSABS 0.2 10/13/2022 1501   EOSABS 0.0 06/17/2022 1349   EOSABS 0.0 02/17/2014 0323   BASOSABS 0.0 10/13/2022 1501   BASOSABS 0.0 06/17/2022 1349   BASOSABS 0.1 02/17/2014 0323    Pulmonary Functions Testing Results:     No data to display          Outpatient Medications Prior to Visit  Medication Sig Dispense Refill   Calcium Carb-Cholecalciferol (OYSTER SHELL CALCIUM W/D)  500-5 MG-MCG TABS Take by mouth.     calcium carbonate (TUMS EX) 750 MG chewable tablet Chew 2 tablets by mouth 4 (four) times daily as needed for heartburn.     cetirizine (ZYRTEC) 10 MG tablet Take 10 mg by mouth at bedtime.     fluticasone (FLONASE) 50 MCG/ACT nasal spray Place 1 spray into both nostrils daily.     ibuprofen (ADVIL) 200 MG tablet Take 200-400 mg by mouth every 6 (six) hours as needed for headache.     lidocaine-prilocaine (EMLA) cream APPLY TO AFFECTED AREA ONCE 30 g 0   magnesium chloride (SLOW-MAG) 64 MG TBEC SR tablet Take 1 tablet (64 mg total) by mouth 2 (two) times daily. 60 tablet 2   naloxone (NARCAN) nasal spray 4 mg/0.1 mL      omeprazole (PRILOSEC) 40 MG capsule Take 1 capsule by mouth once daily 90 capsule 0   potassium chloride (MICRO-K) 10 MEQ CR capsule TAKE 1 CAPSULE BY MOUTH ONCE DAILY WITH FUROSEMIDE TO AVOID LOW POTASSIUM OR CRAMPS 30 capsule 0   traMADol (ULTRAM) 50 MG tablet Take 1 tablet (50 mg total) by mouth every 6 (six) hours as needed. 20 tablet 0   VENTOLIN HFA 108 (90 Base) MCG/ACT inhaler INHALE 1 PUFF BY MOUTH EVERY 6 HOURS AS NEEDED OR SHORTNESS OF BREATH FOR WHEEZING 18 g 0   Vitamin D, Ergocalciferol, (DRISDOL) 1.25 MG (50000 UNIT) CAPS capsule Take 50,000 Units by mouth once a week.     furosemide (LASIX) 20 MG tablet TAKE 1 TABLET BY MOUTH ONCE DAILY IN THE MORNING FOR FLUID PILL.  ELEVATE FEET THROUGHOUT THE DAY, COMPRESSION SOCKS RECOMMENDED (Patient not taking: Reported on 11/21/2022) 30 tablet 0   ciclopirox (PENLAC) 8 % solution APPLY TOPICALLY AT BEDTIME 7 mL 3   Facility-Administered Medications Prior to Visit  Medication Dose Route Frequency Provider Last Rate Last Admin   famotidine (PEPCID) 20-0.9 MG/50ML-% IVPB

## 2022-11-23 ENCOUNTER — Ambulatory Visit
Admission: EM | Admit: 2022-11-23 | Discharge: 2022-11-23 | Disposition: A | Discharge status: Home / Self Care | Payer: Medicaid Other | Attending: Emergency Medicine | Admitting: Emergency Medicine

## 2022-11-23 DIAGNOSIS — Z4802 Encounter for removal of sutures: Secondary | ICD-10-CM

## 2022-11-23 DIAGNOSIS — S81011D Laceration without foreign body, right knee, subsequent encounter: Secondary | ICD-10-CM

## 2022-11-23 NOTE — ED Triage Notes (Signed)
Patient to Urgent Care for suture removal.  Patient had four sutures placed to right knee on 11/14/22. Area well-healed. Denies any other issues.

## 2022-11-23 NOTE — Discharge Instructions (Addendum)
Follow-up with your primary care provider as needed.

## 2022-11-23 NOTE — ED Provider Notes (Signed)
Yvonne Scott    CSN: [id number] Arrival date & time: 11/23/22  1443      History   Chief Complaint Chief Complaint  Patient presents with   Suture / Staple Removal    HPI Yvonne Scott is a 62 y.o. female.  Patient presents with request for removal of sutures from her right knee.  The wound has been healing well.  No fever, wound drainage, or other symptoms.  Patient was seen in the ED on 11/14/2022; diagnosed with laceration of the right knee; 4 sutures.  The history is provided by the patient and medical records.    Past Medical History:  Diagnosis Date   Acute kidney injury (HCC)    Anxiety    Aortic atherosclerosis (HCC) 10/24/2016   Breast cancer (HCC)    Chemotherapy-induced neuropathy (HCC)    Essential hypertension    GERD (gastroesophageal reflux disease)    Hypokalemia    Hypomagnesemia    Invasive ductal carcinoma of right breast (HCC)    Metabolic acidosis    Personal history of chemotherapy    Pneumonia    Severe protein-calorie malnutrition (HCC)    Thrombocytopenia (HCC)     Patient Active Problem List   Diagnosis Date Noted   Rectal mass 10/13/2022   Lung nodule 08/12/2022   Mixed hyperlipidemia 06/21/2022   Cramps, muscle, general 06/21/2022   Other fatigue 06/21/2022   Rib pain on right side 01/25/2022   Vitamin D deficiency 01/25/2022   Thrush 01/25/2022   B12 deficiency 01/06/2022   Protein-calorie malnutrition, severe 09/07/2021   Dysphagia 09/05/2021   Prolonged QT interval 09/05/2021   Syncope, vasovagal 09/04/2021   Syncope 09/04/2021   Antineoplastic chemotherapy induced anemia 08/24/2021   Neutropenic fever (HCC)    HCAP (healthcare-associated pneumonia) 07/16/2021   Sepsis (HCC) 07/16/2021   Pancytopenia (HCC)    Port-A-Cath in place 07/07/2021   Anxiety associated with cancer diagnosis (HCC) 07/07/2021   Chemotherapy-induced neuropathy (HCC) 06/08/2021   Anxiety 06/08/2021   Hypomagnesemia 04/13/2021    Generalized body aches 03/11/2021   Neoplasm related pain 03/11/2021   Encounter for antineoplastic chemotherapy 03/11/2021   Encounter for monitoring cardiotoxic drug therapy 02/27/2021   Invasive ductal carcinoma of right breast (HCC) 02/20/2021   Goals of care, counseling/discussion 02/20/2021   Metabolic acidosis 02/22/2020   DKA (diabetic ketoacidosis) (HCC) 02/21/2020   Elevated LFTs 02/21/2020   AKI (acute kidney injury) (HCC) 02/21/2020   Leukocytosis 02/21/2020   Hyponatremia 01/30/2017   Hypochloremia 01/30/2017   Hypokalemia 01/27/2017   Hyperglycemia 01/27/2017   Back pain 12/15/2016   Chronic lower back pain 12/15/2016   Elevated ferritin 11/07/2016   Aortic atherosclerosis (HCC) 10/24/2016   Fatty liver 10/24/2016   Thrombocytopenia (HCC) 10/24/2016   Medication monitoring encounter 11/19/2015   GERD (gastroesophageal reflux disease) 11/19/2015   Tobacco abuse 11/19/2015   Essential hypertension, benign 11/19/2015    Past Surgical History:  Procedure Laterality Date   BREAST BIOPSY Right 2022   FOOT SURGERY Left    little toe correction   MASTECTOMY W/ SENTINEL NODE BIOPSY Right 10/19/2021   Procedure: MASTECTOMY WITH SENTINEL LYMPH NODE BIOPSY ( modified radical);  Surgeon: Carolan Shiver, MD;  Location: [facility] ORS;  Service: General;  Laterality: Right;   PORTA CATH INSERTION  01/19/2021    OB History   No obstetric history on file.      Home Medications    Prior to Admission medications   Medication Sig Start Date End Date Taking? Authorizing  Provider  azithromycin (ZITHROMAX) 250 MG tablet Take 2 tablets (500 mg) on  Day 1,  followed by 1 tablet (250 mg) once daily on Days 2 through 5. 11/21/22 11/26/22  Raechel Chute, MD  Calcium Carb-Cholecalciferol (OYSTER SHELL CALCIUM W/D) 500-5 MG-MCG TABS Take by mouth. 03/23/21   [provider]  calcium carbonate (TUMS EX) 750 MG chewable tablet Chew 2 tablets by mouth 4 (four) times daily as needed  for heartburn.    [provider]  cetirizine (ZYRTEC) 10 MG tablet Take 10 mg by mouth at bedtime. 05/23/22   [provider]  fluticasone (FLONASE) 50 MCG/ACT nasal spray Place 1 spray into both nostrils daily. 10/25/21   [provider]  furosemide (LASIX) 20 MG tablet TAKE 1 TABLET BY MOUTH ONCE DAILY IN THE MORNING FOR FLUID PILL.  ELEVATE FEET THROUGHOUT THE DAY, COMPRESSION SOCKS RECOMMENDED Patient not taking: Reported on 11/21/2022 07/14/22   Orson Eva, NP  ibuprofen (ADVIL) 200 MG tablet Take 200-400 mg by mouth every 6 (six) hours as needed for headache.    [provider]  lidocaine-prilocaine (EMLA) cream APPLY TO AFFECTED AREA ONCE 11/09/22   Rickard Patience, MD  magnesium chloride (SLOW-MAG) 64 MG TBEC SR tablet Take 1 tablet (64 mg total) by mouth 2 (two) times daily. 03/30/21   Rickard Patience, MD  naloxone [facility]) nasal spray 4 mg/0.1 mL  10/28/21   [provider]  omeprazole (PRILOSEC) 40 MG capsule Take 1 capsule by mouth once daily 08/17/22   Miki Kins, FNP  potassium chloride (MICRO-K) 10 MEQ CR capsule TAKE 1 CAPSULE BY MOUTH ONCE DAILY WITH FUROSEMIDE TO AVOID LOW POTASSIUM OR CRAMPS 06/06/22   Orson Eva, NP  predniSONE (DELTASONE) 20 MG tablet Take 1 tablet (20 mg total) by mouth daily with breakfast for 5 days. 11/21/22 11/26/22  Raechel Chute, MD  traMADol (ULTRAM) 50 MG tablet Take 1 tablet (50 mg total) by mouth every 6 (six) hours as needed. 11/14/22 11/14/23  Jene Every, MD  VENTOLIN HFA 108 (90 Base) MCG/ACT inhaler INHALE 1 PUFF BY MOUTH EVERY 6 HOURS AS NEEDED OR SHORTNESS OF BREATH FOR WHEEZING 09/07/22   Miki Kins, FNP  Vitamin D, Ergocalciferol, (DRISDOL) 1.25 MG (50000 UNIT) CAPS capsule Take 50,000 Units by mouth once a week. 08/14/22   [provider]    Family History Family History  Problem Relation Age of Onset   Cancer Mother 22       breast   Cancer Father        melonma   Cirrhosis Maternal  Grandfather    Cancer Paternal Grandmother        skin/breast    Social History Social History   Tobacco Use   Smoking status: Every Day    Current packs/day: 1.00    Average packs/day: 1 pack/day for 43.7 years (43.7 ttl pk-yrs)    Types: Cigarettes    Start date: 03/15/1979   Smokeless tobacco: Never  Vaping Use   Vaping status: Never Used  Substance Use Topics   Alcohol use: No    Alcohol/week: 3.0 standard drinks of alcohol    Types: 3 Standard drinks or equivalent per week   Drug use: Not Currently     Allergies   Patient has no known allergies.   Review of Systems Review of Systems  Constitutional:  Negative for chills and fever.  Skin:  Positive for wound. Negative for color change.     Physical Exam Triage  Vital Signs ED Triage Vitals  Encounter Vitals Group     BP      Systolic BP Percentile      Diastolic BP Percentile      Pulse      Resp      Temp      Temp src      SpO2      Weight      Height      Head Circumference      Peak Flow      Pain Score      Pain Loc      Pain Education      Exclude from Growth Chart    No data found.  Updated Vital Signs BP 139/81   Pulse 97   Temp 98.2 F (36.8 C)   Resp 19   LMP 11/19/2002 (Approximate)   SpO2 98%   Visual Acuity Right Eye Distance:   Left Eye Distance:   Bilateral Distance:    Right Eye Near:   Left Eye Near:    Bilateral Near:     Physical Exam Constitutional:      General: She is not in acute distress. HENT:     Mouth/Throat:     Mouth: Mucous membranes are moist.  Cardiovascular:     Rate and Rhythm: Normal rate and regular rhythm.  Pulmonary:     Effort: Pulmonary effort is normal. No respiratory distress.  Skin:    General: Skin is warm and dry.     Findings: Lesion present.     Comments: Well-healing laceration on right knee.  Neurological:     Mental Status: She is alert.  Psychiatric:        Mood and Affect: Mood normal.        Behavior: Behavior normal.       UC Treatments / Results  Labs (all labs ordered are listed, but only abnormal results are displayed) Labs Reviewed - No data to display  EKG   Radiology No results found.  Procedures Procedures (including critical care time)  Medications Ordered in UC Medications - No data to display  Initial Impression / Assessment and Plan / UC Course  I have reviewed the triage vital signs and the nursing notes.  Pertinent labs & imaging results that were available during my care of the patient were reviewed by me and considered in my medical decision making (see chart for details).    Encounter for suture removal, laceration of right knee.  4 sutures removed by RN.  Wound appears to be healing well.  No indication of infection.  Vital signs are stable.  Instructed patient to follow-up with her PCP as needed.  Education provided on suture removal after care.  She agrees to plan of care.  Final Clinical Impressions(s) / UC Diagnoses   Final diagnoses:  Encounter for removal of sutures  Laceration of right knee, subsequent encounter     Discharge Instructions      Follow up with your primary care provider as needed.        ED Prescriptions   None    PDMP not reviewed this encounter.   Mickie Bail, NP 11/23/22 1531

## 2022-12-06 ENCOUNTER — Other Ambulatory Visit: Payer: Self-pay

## 2022-12-06 MED ORDER — VENTOLIN HFA 108 (90 BASE) MCG/ACT IN AERS: 1.0000 | INHALATION_SPRAY | Freq: Four times a day (QID) | RESPIRATORY_TRACT | 0 refills | Status: DC | PRN

## 2022-12-07 ENCOUNTER — Ambulatory Visit: Payer: Medicaid Other | Admitting: Certified Registered"

## 2022-12-07 ENCOUNTER — Encounter
Admission: RE | Disposition: A | Discharge status: Home / Self Care | Payer: Self-pay | Source: Home / Self Care | Attending: Gastroenterology

## 2022-12-07 ENCOUNTER — Encounter: Payer: Self-pay | Admitting: Gastroenterology

## 2022-12-07 ENCOUNTER — Ambulatory Visit
Admission: RE | Admit: 2022-12-07 | Discharge: 2022-12-07 | Disposition: A | Discharge status: Home / Self Care | Payer: Medicaid Other | Attending: Gastroenterology | Admitting: Gastroenterology

## 2022-12-07 DIAGNOSIS — Z9011 Acquired absence of right breast and nipple: Secondary | ICD-10-CM | POA: Insufficient documentation

## 2022-12-07 DIAGNOSIS — Z853 Personal history of malignant neoplasm of breast: Secondary | ICD-10-CM | POA: Insufficient documentation

## 2022-12-07 DIAGNOSIS — R933 Abnormal findings on diagnostic imaging of other parts of digestive tract: Secondary | ICD-10-CM | POA: Insufficient documentation

## 2022-12-07 DIAGNOSIS — F17210 Nicotine dependence, cigarettes, uncomplicated: Secondary | ICD-10-CM | POA: Insufficient documentation

## 2022-12-07 DIAGNOSIS — J449 Chronic obstructive pulmonary disease, unspecified: Secondary | ICD-10-CM | POA: Diagnosis not present

## 2022-12-07 DIAGNOSIS — C20 Malignant neoplasm of rectum: Secondary | ICD-10-CM

## 2022-12-07 DIAGNOSIS — Z8379 Family history of other diseases of the digestive system: Secondary | ICD-10-CM | POA: Diagnosis not present

## 2022-12-07 DIAGNOSIS — K219 Gastro-esophageal reflux disease without esophagitis: Secondary | ICD-10-CM | POA: Insufficient documentation

## 2022-12-07 DIAGNOSIS — R948 Abnormal results of function studies of other organs and systems: Secondary | ICD-10-CM

## 2022-12-07 DIAGNOSIS — I10 Essential (primary) hypertension: Secondary | ICD-10-CM | POA: Diagnosis not present

## 2022-12-07 HISTORY — PX: POLYPECTOMY: SHX5525

## 2022-12-07 HISTORY — PX: COLONOSCOPY WITH PROPOFOL: SHX5780

## 2022-12-07 HISTORY — PX: SUBMUCOSAL TATTOO INJECTION: SHX6856

## 2022-12-07 SURGERY — COLONOSCOPY WITH PROPOFOL
Anesthesia: General

## 2022-12-07 MED ORDER — HEPARIN SOD (PORK) LOCK FLUSH 100 UNIT/ML IV SOLN
250.0000 [IU] | INTRAVENOUS | Status: AC | PRN
  Administered 2022-12-07: 250 [IU]

## 2022-12-07 MED ORDER — LIDOCAINE HCL (PF) 2 % IJ SOLN
INTRAMUSCULAR | Status: DC | PRN
  Administered 2022-12-07: 100 mg via INTRADERMAL

## 2022-12-07 MED ORDER — PROPOFOL 10 MG/ML IV BOLUS
INTRAVENOUS | Status: AC
  Filled 2022-12-07: qty 40

## 2022-12-07 MED ORDER — SODIUM CHLORIDE 0.9% FLUSH: 10.0000 mL | INTRAVENOUS | Status: DC | PRN

## 2022-12-07 MED ORDER — SODIUM CHLORIDE 0.9 % IV SOLN: INTRAVENOUS | Status: DC

## 2022-12-07 MED ORDER — LIDOCAINE HCL (PF) 2 % IJ SOLN
INTRAMUSCULAR | Status: AC
  Filled 2022-12-07: qty 5

## 2022-12-07 MED ORDER — PROPOFOL 10 MG/ML IV BOLUS
INTRAVENOUS | Status: DC | PRN
  Administered 2022-12-07: 80 mg via INTRAVENOUS
  Administered 2022-12-07: 180 ug/kg/min via INTRAVENOUS

## 2022-12-07 MED ORDER — SPOT INK MARKER SYRINGE KIT
PACK | SUBMUCOSAL | Status: DC | PRN
  Administered 2022-12-07: 3 mL via SUBMUCOSAL

## 2022-12-07 MED ORDER — HEPARIN SOD (PORK) LOCK FLUSH 100 UNIT/ML IV SOLN
INTRAVENOUS | Status: AC
  Filled 2022-12-07: qty 5

## 2022-12-07 NOTE — Anesthesia Postprocedure Evaluation (Signed)
Anesthesia Post Note  Patient: ANALEYAH AISPURO  Procedure(s) Performed: COLONOSCOPY WITH PROPOFOL POLYPECTOMY  Patient location during evaluation: Endoscopy Anesthesia Type: General Level of consciousness: awake and alert Pain management: pain level controlled Vital Signs Assessment: post-procedure vital signs reviewed and stable Respiratory status: spontaneous breathing, nonlabored ventilation, respiratory function stable and patient connected to nasal cannula oxygen Cardiovascular status: blood pressure returned to baseline and stable Postop Assessment: no apparent nausea or vomiting Anesthetic complications: no   No notable events documented.   Last Vitals:  Vitals:   12/07/22 1004 12/07/22 1014  BP: 101/68 115/67  Pulse: 89 86  Resp: (!) 22 (!) 21  Temp:    SpO2: 100% 100%    Last Pain:  Vitals:   12/07/22 1014  TempSrc:   PainSc: 0-No pain                 Cleda Mccreedy Nahomi Hegner

## 2022-12-07 NOTE — Transfer of Care (Signed)
Immediate Anesthesia Transfer of Care Note  Patient: Yvonne Scott  Procedure(s) Performed: COLONOSCOPY WITH PROPOFOL POLYPECTOMY  Patient Location: Endoscopy Unit  Anesthesia Type:General  Level of Consciousness: awake, alert , and oriented  Airway & Oxygen Therapy: Patient Spontanous Breathing  Post-op Assessment: Report given to RN and Post -op Vital signs reviewed and stable  Post vital signs: Reviewed and stable  Last Vitals:  Vitals Value Taken Time  BP 101/68 12/07/22 1004  Temp 35.7 1004  Pulse 89 12/07/22 1004  Resp 22 12/07/22 1004  SpO2 100 % 12/07/22 1004    Last Pain:  Vitals:   12/07/22 1004  TempSrc:   PainSc: 0-No pain         Complications: No notable events documented.

## 2022-12-07 NOTE — Anesthesia Preprocedure Evaluation (Signed)
Anesthesia Evaluation  Patient identified by MRN, date of birth, ID band Patient awake    Reviewed: Allergy & Precautions, NPO status , Patient's Chart, lab work & pertinent test results  History of Anesthesia Complications Negative for: history of anesthetic complications  Airway Mallampati: III  TM Distance: <3 FB Neck ROM: full    Dental  (+) Chipped, Poor Dentition, Missing   Pulmonary COPD, Current Smoker   Pulmonary exam normal        Cardiovascular Exercise Tolerance: Good hypertension, (-) angina Normal cardiovascular exam     Neuro/Psych  Neuromuscular disease  negative psych ROS   GI/Hepatic Neg liver ROS,GERD  Controlled,,  Endo/Other  diabetes    Renal/GU Renal disease  negative genitourinary   Musculoskeletal   Abdominal   Peds  Hematology negative hematology ROS (+)   Anesthesia Other Findings Past Medical History: No date: Acute kidney injury (HCC) No date: Anxiety 10/24/2016: Aortic atherosclerosis (HCC) No date: Breast cancer (HCC) No date: Chemotherapy-induced neuropathy (HCC) No date: Essential hypertension No date: GERD (gastroesophageal reflux disease) No date: Hypokalemia No date: Hypomagnesemia No date: Invasive ductal carcinoma of right breast (HCC) No date: Metabolic acidosis No date: Personal history of chemotherapy No date: Pneumonia No date: Severe protein-calorie malnutrition (HCC) No date: Thrombocytopenia [facility])  Past Surgical History: 2022: BREAST BIOPSY; Right No date: FOOT SURGERY; Left     Comment:  little toe correction 10/19/2021: MASTECTOMY W/ SENTINEL NODE BIOPSY; Right     Comment:  Procedure: MASTECTOMY WITH SENTINEL LYMPH NODE BIOPSY (               modified radical);  Surgeon: Carolan Shiver, MD;                Location: [facility] ORS;  Service: General;  Laterality:               Right; 01/19/2021: PORTA CATH INSERTION      Reproductive/Obstetrics negative OB ROS                             Anesthesia Physical Anesthesia Plan  ASA: 3  Anesthesia Plan: General   Post-op Pain Management:    Induction: Intravenous  PONV Risk Score and Plan: Propofol infusion and TIVA  Airway Management Planned: Natural Airway and Nasal Cannula  Additional Equipment:   Intra-op Plan:   Post-operative Plan:   Informed Consent: I have reviewed the patients History and Physical, chart, labs and discussed the procedure including the risks, benefits and alternatives for the proposed anesthesia with the patient or authorized representative who has indicated his/her understanding and acceptance.     Dental Advisory Given  Plan Discussed with: Anesthesiologist, CRNA and Surgeon  Anesthesia Plan Comments: (Patient consented for risks of anesthesia including but not limited to:  - adverse reactions to medications - risk of airway placement if required - damage to eyes, teeth, lips or other oral mucosa - nerve damage due to positioning  - sore throat or hoarseness - Damage to heart, brain, nerves, lungs, other parts of body or loss of life  Patient voiced understanding.)       Anesthesia Quick Evaluation

## 2022-12-07 NOTE — H&P (Signed)
Arlyss Repress, MD [address]  [address]  [place], [place] [address]  Main: [phone]  Fax: [phone] Pager: [phone]  Primary Care Physician:  Orson Eva, NP (Inactive) Primary Gastroenterologist:  Dr. Arlyss Repress  Pre-Procedure History & Physical: HPI:  Yvonne Scott is a 62 y.o. female is here for an colonoscopy.   Past Medical History:  Diagnosis Date   Acute kidney injury (HCC)    Anxiety    Aortic atherosclerosis (HCC) 10/24/2016   Breast cancer (HCC)    Chemotherapy-induced neuropathy (HCC)    Essential hypertension    GERD (gastroesophageal reflux disease)    Hypokalemia    Hypomagnesemia    Invasive ductal carcinoma of right breast (HCC)    Metabolic acidosis    Personal history of chemotherapy    Pneumonia    Severe protein-calorie malnutrition (HCC)    Thrombocytopenia (HCC)     Past Surgical History:  Procedure Laterality Date   BREAST BIOPSY Right 2022   FOOT SURGERY Left    little toe correction   MASTECTOMY W/ SENTINEL NODE BIOPSY Right 10/19/2021   Procedure: MASTECTOMY WITH SENTINEL LYMPH NODE BIOPSY ( modified radical);  Surgeon: Carolan Shiver, MD;  Location: [facility] ORS;  Service: General;  Laterality: Right;   PORTA CATH INSERTION  01/19/2021    Prior to Admission medications   Medication Sig Start Date End Date Taking? Authorizing Provider  ibuprofen (ADVIL) 200 MG tablet Take 200-400 mg by mouth every 6 (six) hours as needed for headache.   Yes [provider]  magnesium chloride (SLOW-MAG) 64 MG TBEC SR tablet Take 1 tablet (64 mg total) by mouth 2 (two) times daily. 03/30/21  Yes Rickard Patience, MD  omeprazole (PRILOSEC) 40 MG capsule Take 1 capsule by mouth once daily 08/17/22  Yes Miki Kins, FNP  potassium chloride (MICRO-K) 10 MEQ CR capsule TAKE 1 CAPSULE BY MOUTH ONCE DAILY WITH FUROSEMIDE TO AVOID LOW POTASSIUM OR CRAMPS 06/06/22  Yes Boswell, Gareth Morgan, NP  VENTOLIN HFA 108 (90 Base) MCG/ACT inhaler  Inhale 1 puff into the lungs every 6 (six) hours as needed for wheezing or shortness of breath. 12/06/22  Yes Scoggins, Hospital doctor, NP  Vitamin D, Ergocalciferol, (DRISDOL) 1.25 MG (50000 UNIT) CAPS capsule Take 50,000 Units by mouth once a week. 08/14/22  Yes [provider]  Calcium Carb-Cholecalciferol (OYSTER SHELL CALCIUM W/D) 500-5 MG-MCG TABS Take by mouth. 03/23/21   [provider]  calcium carbonate (TUMS EX) 750 MG chewable tablet Chew 2 tablets by mouth 4 (four) times daily as needed for heartburn.    [provider]  cetirizine (ZYRTEC) 10 MG tablet Take 10 mg by mouth at bedtime. 05/23/22   [provider]  fluticasone (FLONASE) 50 MCG/ACT nasal spray Place 1 spray into both nostrils daily. 10/25/21   [provider]  furosemide (LASIX) 20 MG tablet TAKE 1 TABLET BY MOUTH ONCE DAILY IN THE MORNING FOR FLUID PILL.  ELEVATE FEET THROUGHOUT THE DAY, COMPRESSION SOCKS RECOMMENDED Patient not taking: Reported on 11/21/2022 07/14/22   Orson Eva, NP  gabapentin (NEURONTIN) 100 MG capsule Take 100 mg by mouth 3 (three) times daily. Patient not taking: Reported on 12/07/2022    [provider]  lidocaine-prilocaine (EMLA) cream APPLY TO AFFECTED AREA ONCE 11/09/22   Rickard Patience, MD  naloxone [facility]) nasal spray 4 mg/0.1 mL  10/28/21   [provider]  traMADol (ULTRAM) 50 MG tablet Take 1 tablet (50 mg total) by mouth every 6 (  six) hours as needed. 11/14/22 11/14/23  Jene Every, MD    Allergies as of 10/25/2022   (No Known Allergies)    Family History  Problem Relation Age of Onset   Cancer Mother 33       breast   Cancer Father        melonma   Cirrhosis Maternal Grandfather    Cancer Paternal Grandmother        skin/breast    Social History   Socioeconomic History   Marital status: Widowed    Spouse name: Not on file   Number of children: 1   Years of education: Not on file   Highest education level: Not on file   Occupational History   Not on file  Tobacco Use   Smoking status: Every Day    Current packs/day: 1.00    Average packs/day: 1 pack/day for 43.7 years (43.7 ttl pk-yrs)    Types: Cigarettes    Start date: 03/15/1979   Smokeless tobacco: Never  Vaping Use   Vaping status: Never Used  Substance and Sexual Activity   Alcohol use: No    Alcohol/week: 3.0 standard drinks of alcohol    Types: 3 Standard drinks or equivalent per week   Drug use: Not Currently   Sexual activity: Not Currently    Birth control/protection: None  Other Topics Concern   Not on file  Social History Narrative   Lives with mother   Social Determinants of Health   Financial Resource Strain: Low Risk  (01/06/2022)   Overall Financial Resource Strain (CARDIA)    Difficulty of Paying Living Expenses: Not very hard  Food Insecurity: No Food Insecurity (01/06/2022)   Hunger Vital Sign    Worried About Running Out of Food in the Last Year: Never true    Ran Out of Food in the Last Year: Never true  Transportation Needs: No Transportation Needs (01/06/2022)   PRAPARE - Administrator, Civil Service (Medical): No    Lack of Transportation (Non-Medical): No  Physical Activity: Inactive (01/06/2022)   Exercise Vital Sign    Days of Exercise per Week: 0 days    Minutes of Exercise per Session: 0 min  Stress: Stress Concern Present (01/06/2022)   [employer] of Occupational Health - Occupational Stress Questionnaire    Feeling of Stress : Rather much  Social Connections: Moderately Isolated (01/06/2022)   Social Connection and Isolation Panel [NHANES]    Frequency of Communication with Friends and Family: Once a week    Frequency of Social Gatherings with Friends and Family: Once a week    Attends Religious Services: 1 to 4 times per year    Active Member of [employer] or Organizations: Yes    Attends Banker Meetings: Never    Marital Status: Widowed  Intimate Partner Violence: Not At  Risk (01/06/2022)   Humiliation, Afraid, Rape, and Kick questionnaire    Fear of Current or Ex-Partner: No    Emotionally Abused: No    Physically Abused: No    Sexually Abused: No    Review of Systems: See HPI, otherwise negative ROS  Physical Exam: BP 137/87   Pulse (!) 101   Temp (!) 96.8 F (36 C) (Temporal)   Resp 16   Wt 54.4 kg   LMP 11/19/2002 (Approximate)   SpO2 99%   BMI 21.95 kg/m  General:   Alert,  pleasant and cooperative in NAD Head:  Normocephalic and atraumatic. Neck:  Supple; no  masses or thyromegaly. Lungs:  Clear throughout to auscultation.    Heart:  Regular rate and rhythm. Abdomen:  Soft, nontender and nondistended. Normal bowel sounds, without guarding, and without rebound.   Neurologic:  Alert and  oriented x4;  grossly normal neurologically.  Impression/Plan: Yvonne Scott is here for an colonoscopy to be performed for Recent PET scan revealed hypermetabolism, representing soft tissue fullness in mid rectum   Risks, benefits, limitations, and alternatives regarding  colonoscopy have been reviewed with the patient.  Questions have been answered.  All parties agreeable.   Lannette Donath, MD  12/07/2022, 9:20 AM

## 2022-12-07 NOTE — Op Note (Signed)
[facility] Gastroenterology Patient Name: Yvonne Scott Procedure Date: 12/07/2022 9:14 AM MRN: [id number] Account #: [id number] Date of Birth: 1960/08/01 Admit Type: Outpatient Age: 62 Room: [facility] ENDO ROOM 4 Gender: Female Note Status: Finalized Instrument Name: Peds Colonoscope [id number] Procedure:             Colonoscopy Indications:           This is the patient's first colonoscopy, Abnormal PET                         scan of the GI tract Providers:             Toney Reil MD, MD Medicines:             General Anesthesia Complications:         No immediate complications. Estimated blood loss: None. Procedure:             Pre-Anesthesia Assessment:                        - Prior to the procedure, a History and Physical was                         performed, and patient medications and allergies were                         reviewed. The patient is competent. The risks and                         benefits of the procedure and the sedation options and                         risks were discussed with the patient. All questions                         were answered and informed consent was obtained.                         Patient identification and proposed procedure were                         verified by the physician, the nurse, the                         anesthesiologist, the anesthetist and the technician                         in the pre-procedure area in the procedure room in the                         endoscopy suite. Mental Status Examination: alert and                         oriented. Airway Examination: normal oropharyngeal                         airway and neck mobility. Respiratory Examination:  clear to auscultation. CV Examination: normal.                         Prophylactic Antibiotics: The patient does not require                         prophylactic antibiotics. Prior Anticoagulants: The                          patient has taken no anticoagulant or antiplatelet                         agents. ASA Grade Assessment: III - A patient with                         severe systemic disease. After reviewing the risks and                         benefits, the patient was deemed in satisfactory                         condition to undergo the procedure. The anesthesia                         plan was to use general anesthesia. Immediately prior                         to administration of medications, the patient was                         re-assessed for adequacy to receive sedatives. The                         heart rate, respiratory rate, oxygen saturations,                         blood pressure, adequacy of pulmonary ventilation, and                         response to care were monitored throughout the                         procedure. The physical status of the patient was                         re-assessed after the procedure.                        After obtaining informed consent, the colonoscope was                         passed under direct vision. Throughout the procedure,                         the patient's blood pressure, pulse, and oxygen                         saturations were monitored continuously. The  Colonoscope was introduced through the anus and                         advanced to the the terminal ileum, with                         identification of the appendiceal orifice and IC                         valve. The colonoscopy was performed without                         difficulty. The patient tolerated the procedure well.                         The quality of the bowel preparation was evaluated                         using the BBPS [facility] Bowel Preparation Scale) with                         scores of: Right Colon = 3, Transverse Colon = 3 and                         Left Colon = 3 (entire mucosa seen well with no                          residual staining, small fragments of stool or opaque                         liquid). The total BBPS score equals 9. The terminal                         ileum, ileocecal valve, appendiceal orifice, and                         rectum were photographed. Findings:      The perianal and digital rectal examinations were normal. Pertinent       negatives include normal sphincter tone and no palpable rectal lesions.      A greater than 50 mm polyp was found in the mid rectum. The polyp was       carpet-like and sessile. Polypectomy was not attempted due to polyp size       (too large to be excised). Biopsies were taken from tip of the polyp       with a cold forceps for histology. Estimated blood loss: none. Area was       tattooed with an injection of Spot (carbon black).      The retroflexed view of the distal rectum and anal verge was normal and       showed no anal or rectal abnormalities.      The exam was otherwise without abnormality.      The terminal ileum appeared normal. Impression:            - One greater than 50 mm polyp in the mid rectum.  Resection not attempted. Biopsied. Tattooed.                        - The distal rectum and anal verge are normal on                         retroflexion view.                        - The examination was otherwise normal.                        - The examined portion of the ileum was normal. Recommendation:        - Discharge patient to home (with escort).                        - Resume previous diet today.                        - Continue present medications.                        - Await pathology results. Procedure Code(s):     --- Professional ---                        [phone], Colonoscopy, flexible; with biopsy, single or                         multiple                        [id number], Colonoscopy, flexible; with directed submucosal                         injection(s), any substance Diagnosis Code(s):     ---  Professional ---                        D12.8, Benign neoplasm of rectum                        R93.3, Abnormal findings on diagnostic imaging of                         other parts of digestive tract CPT copyright 2022 American Medical Association. All rights reserved. The codes documented in this report are preliminary and upon coder review may  be revised to meet current compliance requirements. Dr. Libby Maw Toney Reil MD, MD 12/07/2022 10:02:19 AM This report has been signed electronically. Number of Addenda: 0 Note Initiated On: 12/07/2022 9:14 AM Scope Withdrawal Time: 0 hours 9 minutes 25 seconds  Total Procedure Duration: 0 hours 12 minutes 58 seconds  Estimated Blood Loss:  Estimated blood loss: none.      [facility]

## 2022-12-08 ENCOUNTER — Encounter: Payer: Self-pay | Admitting: Gastroenterology

## 2022-12-08 LAB — SURGICAL PATHOLOGY

## 2022-12-09 ENCOUNTER — Telehealth: Payer: Self-pay | Admitting: Oncology

## 2022-12-09 NOTE — Telephone Encounter (Signed)
Colonoscopy showed 50 mm rectal polyp, biopsy showed at least intra mucosa carcinoma. Discussed with pathologist who will compare morphology with previous breast cancer slides. She has establish care with pulmonology and wanted to defer bronchoscopy biopsy of her lung nodules until colonoscopy. I called the patient and updated her about the pathology results she is ready to move forward for bronchoscopy biopsy.   I will reach out to pulmonology to arrange biopsy via bronchoscopy. Also discussed results with patient's mother after obtaining patient's permission. I plan to see patient 1 week after biopsy of her lung to go over management plan.

## 2022-12-12 ENCOUNTER — Ambulatory Visit: Payer: Medicaid Other | Admitting: Cardiology

## 2022-12-12 ENCOUNTER — Encounter: Payer: Self-pay | Admitting: Cardiology

## 2022-12-12 VITALS — BP 108/76 | HR 112 | Ht 62.0 in | Wt 123.6 lb

## 2022-12-12 DIAGNOSIS — R42 Dizziness and giddiness: Secondary | ICD-10-CM | POA: Diagnosis not present

## 2022-12-12 DIAGNOSIS — E559 Vitamin D deficiency, unspecified: Secondary | ICD-10-CM

## 2022-12-12 DIAGNOSIS — E782 Mixed hyperlipidemia: Secondary | ICD-10-CM

## 2022-12-12 DIAGNOSIS — I10 Essential (primary) hypertension: Secondary | ICD-10-CM | POA: Diagnosis not present

## 2022-12-12 DIAGNOSIS — K219 Gastro-esophageal reflux disease without esophagitis: Secondary | ICD-10-CM

## 2022-12-12 DIAGNOSIS — Z720 Tobacco use: Secondary | ICD-10-CM | POA: Diagnosis not present

## 2022-12-12 LAB — POCT CBG (FASTING - GLUCOSE)-MANUAL ENTRY: Glucose Fasting, POC: 134 mg/dL — AB (ref 70–99)

## 2022-12-12 MED ORDER — TRELEGY ELLIPTA 100-62.5-25 MCG/ACT IN AEPB: 1.0000 | INHALATION_SPRAY | Freq: Every day | RESPIRATORY_TRACT | 1 refills | Status: DC

## 2022-12-12 MED ORDER — OMEPRAZOLE 40 MG PO CPDR: 40.0000 mg | DELAYED_RELEASE_CAPSULE | Freq: Every day | ORAL | 0 refills | Status: DC

## 2022-12-12 NOTE — Progress Notes (Signed)
L) 3.5 - 5.1 mmol/L   Chloride 92 (L) 98 - 111 mmol/L   CO2 25 22 - 32 mmol/L   Glucose, Bld 107 (H) 70 - 99 mg/dL    Comment: Glucose reference range applies only to samples taken after fasting for at least 8 hours.   BUN <5 (L) 8 - 23 mg/dL   Creatinine 1.61 0.96 - 1.00 mg/dL   Calcium 9.2 8.9 - 04.5 mg/dL   Total Protein 6.9 6.5 - 8.1 g/dL   Albumin 4.1 3.5 - 5.0 g/dL   AST 24 15 - 41 U/L   ALT 15 0 - 44 U/L   Alkaline Phosphatase 77 38 - 126 U/L   Total Bilirubin 0.8 0.3 - 1.2 mg/dL   GFR,  Estimated >40 >98 mL/min    Comment: (NOTE) Calculated using the CKD-EPI Creatinine Equation (2021)    Anion gap 12 5 - 15    Comment: Performed at [facility], [address], [place], [place] [address]  Surgical pathology     Status: None   Collection Time: 12/07/22 12:00 AM  Result Value Ref Range   SURGICAL PATHOLOGY      SURGICAL PATHOLOGY [facility] [address] [place], [place] [address] Telephone [phone] or [phone] Fax [phone]  REPORT OF SURGICAL PATHOLOGY   Accession #: [id number] Patient Name: Yvonne Scott, Yvonne Scott Visit # : [id number]  MRN: [id number] Physician: Lannette Donath DOB/Age 29-Apr-1960 (Age: 62) Gender: F Collected Date: 12/07/2022 Received Date: 12/07/2022  FINAL DIAGNOSIS       1. Rectum, polyp(s), CBX :       - AT LEAST INTRAMUCOSAL CARCINOMA (MULTIPLE FRAGMENTS)      - SEE NOTE       Diagnosis Note : Endoscopic finding of a greater than 50 mm polyp of the mid      rectum is noted.  Dr. Oneita Kras reviewed the case and agrees with the above      diagnosis.  Clinical correlation recommended.  Dr. Allegra Lai was notified of the      diagnosis via secure chat on 12/08/2022.      DATE SIGNED OUT: 12/08/2022 ELECTRONIC SIGNATURE : Corey Harold M.D., Dossie Arbour., Pathologist, Electronic Signature  MICROSCOPIC DESCRIPTION  CAS E COMMENTS STAINS USED IN DIAGNOSIS: H&E    CLINICAL HISTORY  SPECIMEN(S) OBTAINED 1. Rectum, polyp(s), CBX  SPECIMEN COMMENTS: SPECIMEN CLINICAL INFORMATION:    Gross Description 1. Received in formalin, labeled "CBX rectum polyp", is a 0.8 x 0.6 x 0.2 cm aggregate of multiple soft, tan tissue fragments submitted entirely in block 1A.      SMB      12/07/2022        Report signed out from the following location(s) [facility] [address] [place], [place] [address] CLIA #: [id number]  [facility] [address]  [place], [place] [address] CLIA #: [id number]   POCT CBG (Fasting - Glucose)     Status: Abnormal   Collection Time: 12/12/22  2:45 PM  Result Value Ref Range   Glucose Fasting, POC 134 (A) 70 - 99 mg/dL      Assessment & Plan:  Return for fasting lab work.  Continue same medications.  Problem List Items Addressed This Visit       Cardiovascular and Mediastinum   Essential hypertension, benign - Primary     Digestive   GERD (gastroesophageal reflux disease)   Relevant Medications   omeprazole (PRILOSEC)  L) 3.5 - 5.1 mmol/L   Chloride 92 (L) 98 - 111 mmol/L   CO2 25 22 - 32 mmol/L   Glucose, Bld 107 (H) 70 - 99 mg/dL    Comment: Glucose reference range applies only to samples taken after fasting for at least 8 hours.   BUN <5 (L) 8 - 23 mg/dL   Creatinine 1.61 0.96 - 1.00 mg/dL   Calcium 9.2 8.9 - 04.5 mg/dL   Total Protein 6.9 6.5 - 8.1 g/dL   Albumin 4.1 3.5 - 5.0 g/dL   AST 24 15 - 41 U/L   ALT 15 0 - 44 U/L   Alkaline Phosphatase 77 38 - 126 U/L   Total Bilirubin 0.8 0.3 - 1.2 mg/dL   GFR,  Estimated >40 >98 mL/min    Comment: (NOTE) Calculated using the CKD-EPI Creatinine Equation (2021)    Anion gap 12 5 - 15    Comment: Performed at [facility], [address], [place], [place] [address]  Surgical pathology     Status: None   Collection Time: 12/07/22 12:00 AM  Result Value Ref Range   SURGICAL PATHOLOGY      SURGICAL PATHOLOGY [facility] [address] [place], [place] [address] Telephone [phone] or [phone] Fax [phone]  REPORT OF SURGICAL PATHOLOGY   Accession #: [id number] Patient Name: Yvonne Scott, Yvonne Scott Visit # : [id number]  MRN: [id number] Physician: Lannette Donath DOB/Age 29-Apr-1960 (Age: 62) Gender: F Collected Date: 12/07/2022 Received Date: 12/07/2022  FINAL DIAGNOSIS       1. Rectum, polyp(s), CBX :       - AT LEAST INTRAMUCOSAL CARCINOMA (MULTIPLE FRAGMENTS)      - SEE NOTE       Diagnosis Note : Endoscopic finding of a greater than 50 mm polyp of the mid      rectum is noted.  Dr. Oneita Kras reviewed the case and agrees with the above      diagnosis.  Clinical correlation recommended.  Dr. Allegra Lai was notified of the      diagnosis via secure chat on 12/08/2022.      DATE SIGNED OUT: 12/08/2022 ELECTRONIC SIGNATURE : Corey Harold M.D., Dossie Arbour., Pathologist, Electronic Signature  MICROSCOPIC DESCRIPTION  CAS E COMMENTS STAINS USED IN DIAGNOSIS: H&E    CLINICAL HISTORY  SPECIMEN(S) OBTAINED 1. Rectum, polyp(s), CBX  SPECIMEN COMMENTS: SPECIMEN CLINICAL INFORMATION:    Gross Description 1. Received in formalin, labeled "CBX rectum polyp", is a 0.8 x 0.6 x 0.2 cm aggregate of multiple soft, tan tissue fragments submitted entirely in block 1A.      SMB      12/07/2022        Report signed out from the following location(s) [facility] [address], [place] [address] CLIA #: [id number]  [facility] [address]  [place], [place] [address] CLIA #: [id number]   POCT CBG (Fasting - Glucose)     Status: Abnormal   Collection Time: 12/12/22  2:45 PM  Result Value Ref Range   Glucose Fasting, POC 134 (A) 70 - 99 mg/dL      Assessment & Plan:  Return for fasting lab work.  Continue same medications.  Problem List Items Addressed This Visit       Cardiovascular and Mediastinum   Essential hypertension, benign - Primary     Digestive   GERD (gastroesophageal reflux disease)   Relevant Medications   omeprazole (PRILOSEC)  Established Patient Office Visit  Subjective:  Patient ID: MAKIA BOSSI, female    DOB: Jul 08, 1960  Age: 62 y.o. MRN: [id number]  Chief Complaint  Patient presents with   Follow-up    Follow up    Patient in office for regular follow up. Patient doing well. Has not had fasting lab work done.  Will return for fasting lab work. Patient following with oncology for metastatic cancer.  Patient continues to smoke. Recommend decreasing and eventually quitting.  Continue same medications.     No other concerns at this time.   Past Medical History:  Diagnosis Date   Acute kidney injury (HCC)    Anxiety    Aortic atherosclerosis (HCC) 10/24/2016   Breast cancer (HCC)    Chemotherapy-induced neuropathy (HCC)    Essential hypertension    GERD (gastroesophageal reflux disease)    Hypokalemia    Hypomagnesemia    Invasive ductal carcinoma of right breast (HCC)    Metabolic acidosis    Personal history of chemotherapy    Pneumonia    Severe protein-calorie malnutrition (HCC)    Thrombocytopenia (HCC)     Past Surgical History:  Procedure Laterality Date   BREAST BIOPSY Right 2022   COLONOSCOPY WITH PROPOFOL N/A 12/07/2022   Procedure: COLONOSCOPY WITH PROPOFOL;  Surgeon: Toney Reil, MD;  Location: [facility] ENDOSCOPY;  Service: Gastroenterology;  Laterality: N/A;   FOOT SURGERY Left    little toe correction   MASTECTOMY W/ SENTINEL NODE BIOPSY Right 10/19/2021   Procedure: MASTECTOMY WITH SENTINEL LYMPH NODE BIOPSY ( modified radical);  Surgeon: Carolan Shiver, MD;  Location: [facility] ORS;  Service: General;  Laterality: Right;   POLYPECTOMY  12/07/2022   Procedure: POLYPECTOMY;  Surgeon: Toney Reil, MD;  Location: [facility] ENDOSCOPY;  Service: Gastroenterology;;   PORTA CATH INSERTION  01/19/2021   SUBMUCOSAL TATTOO INJECTION  12/07/2022   Procedure: SUBMUCOSAL TATTOO INJECTION;  Surgeon: Toney Reil, MD;  Location: [facility] ENDOSCOPY;  Service:  Gastroenterology;;    Social History   Socioeconomic History   Marital status: Widowed    Spouse name: Not on file   Number of children: 1   Years of education: Not on file   Highest education level: Not on file  Occupational History   Not on file  Tobacco Use   Smoking status: Every Day    Current packs/day: 1.00    Average packs/day: 1 pack/day for 43.7 years (43.7 ttl pk-yrs)    Types: Cigarettes    Start date: 03/15/1979   Smokeless tobacco: Never  Vaping Use   Vaping status: Never Used  Substance and Sexual Activity   Alcohol use: No    Alcohol/week: 3.0 standard drinks of alcohol    Types: 3 Standard drinks or equivalent per week   Drug use: Not Currently   Sexual activity: Not Currently    Birth control/protection: None  Other Topics Concern   Not on file  Social History Narrative   Lives with mother   Social Determinants of Health   Financial Resource Strain: Low Risk  (01/06/2022)   Overall Financial Resource Strain (CARDIA)    Difficulty of Paying Living Expenses: Not very hard  Food Insecurity: No Food Insecurity (01/06/2022)   Hunger Vital Sign    Worried About Running Out of Food in the Last Year: Never true    Ran Out of Food in the Last Year: Never true  Transportation Needs: No Transportation Needs (01/06/2022)   PRAPARE - Transportation  Established Patient Office Visit  Subjective:  Patient ID: MAKIA BOSSI, female    DOB: Jul 08, 1960  Age: 62 y.o. MRN: [id number]  Chief Complaint  Patient presents with   Follow-up    Follow up    Patient in office for regular follow up. Patient doing well. Has not had fasting lab work done.  Will return for fasting lab work. Patient following with oncology for metastatic cancer.  Patient continues to smoke. Recommend decreasing and eventually quitting.  Continue same medications.     No other concerns at this time.   Past Medical History:  Diagnosis Date   Acute kidney injury (HCC)    Anxiety    Aortic atherosclerosis (HCC) 10/24/2016   Breast cancer (HCC)    Chemotherapy-induced neuropathy (HCC)    Essential hypertension    GERD (gastroesophageal reflux disease)    Hypokalemia    Hypomagnesemia    Invasive ductal carcinoma of right breast (HCC)    Metabolic acidosis    Personal history of chemotherapy    Pneumonia    Severe protein-calorie malnutrition (HCC)    Thrombocytopenia (HCC)     Past Surgical History:  Procedure Laterality Date   BREAST BIOPSY Right 2022   COLONOSCOPY WITH PROPOFOL N/A 12/07/2022   Procedure: COLONOSCOPY WITH PROPOFOL;  Surgeon: Toney Reil, MD;  Location: [facility] ENDOSCOPY;  Service: Gastroenterology;  Laterality: N/A;   FOOT SURGERY Left    little toe correction   MASTECTOMY W/ SENTINEL NODE BIOPSY Right 10/19/2021   Procedure: MASTECTOMY WITH SENTINEL LYMPH NODE BIOPSY ( modified radical);  Surgeon: Carolan Shiver, MD;  Location: [facility] ORS;  Service: General;  Laterality: Right;   POLYPECTOMY  12/07/2022   Procedure: POLYPECTOMY;  Surgeon: Toney Reil, MD;  Location: [facility] ENDOSCOPY;  Service: Gastroenterology;;   PORTA CATH INSERTION  01/19/2021   SUBMUCOSAL TATTOO INJECTION  12/07/2022   Procedure: SUBMUCOSAL TATTOO INJECTION;  Surgeon: Toney Reil, MD;  Location: [facility] ENDOSCOPY;  Service:  Gastroenterology;;    Social History   Socioeconomic History   Marital status: Widowed    Spouse name: Not on file   Number of children: 1   Years of education: Not on file   Highest education level: Not on file  Occupational History   Not on file  Tobacco Use   Smoking status: Every Day    Current packs/day: 1.00    Average packs/day: 1 pack/day for 43.7 years (43.7 ttl pk-yrs)    Types: Cigarettes    Start date: 03/15/1979   Smokeless tobacco: Never  Vaping Use   Vaping status: Never Used  Substance and Sexual Activity   Alcohol use: No    Alcohol/week: 3.0 standard drinks of alcohol    Types: 3 Standard drinks or equivalent per week   Drug use: Not Currently   Sexual activity: Not Currently    Birth control/protection: None  Other Topics Concern   Not on file  Social History Narrative   Lives with mother   Social Determinants of Health   Financial Resource Strain: Low Risk  (01/06/2022)   Overall Financial Resource Strain (CARDIA)    Difficulty of Paying Living Expenses: Not very hard  Food Insecurity: No Food Insecurity (01/06/2022)   Hunger Vital Sign    Worried About Running Out of Food in the Last Year: Never true    Ran Out of Food in the Last Year: Never true  Transportation Needs: No Transportation Needs (01/06/2022)   PRAPARE - Transportation  Established Patient Office Visit  Subjective:  Patient ID: MAKIA BOSSI, female    DOB: Jul 08, 1960  Age: 62 y.o. MRN: [id number]  Chief Complaint  Patient presents with   Follow-up    Follow up    Patient in office for regular follow up. Patient doing well. Has not had fasting lab work done.  Will return for fasting lab work. Patient following with oncology for metastatic cancer.  Patient continues to smoke. Recommend decreasing and eventually quitting.  Continue same medications.     No other concerns at this time.   Past Medical History:  Diagnosis Date   Acute kidney injury (HCC)    Anxiety    Aortic atherosclerosis (HCC) 10/24/2016   Breast cancer (HCC)    Chemotherapy-induced neuropathy (HCC)    Essential hypertension    GERD (gastroesophageal reflux disease)    Hypokalemia    Hypomagnesemia    Invasive ductal carcinoma of right breast (HCC)    Metabolic acidosis    Personal history of chemotherapy    Pneumonia    Severe protein-calorie malnutrition (HCC)    Thrombocytopenia (HCC)     Past Surgical History:  Procedure Laterality Date   BREAST BIOPSY Right 2022   COLONOSCOPY WITH PROPOFOL N/A 12/07/2022   Procedure: COLONOSCOPY WITH PROPOFOL;  Surgeon: Toney Reil, MD;  Location: [facility] ENDOSCOPY;  Service: Gastroenterology;  Laterality: N/A;   FOOT SURGERY Left    little toe correction   MASTECTOMY W/ SENTINEL NODE BIOPSY Right 10/19/2021   Procedure: MASTECTOMY WITH SENTINEL LYMPH NODE BIOPSY ( modified radical);  Surgeon: Carolan Shiver, MD;  Location: [facility] ORS;  Service: General;  Laterality: Right;   POLYPECTOMY  12/07/2022   Procedure: POLYPECTOMY;  Surgeon: Toney Reil, MD;  Location: [facility] ENDOSCOPY;  Service: Gastroenterology;;   PORTA CATH INSERTION  01/19/2021   SUBMUCOSAL TATTOO INJECTION  12/07/2022   Procedure: SUBMUCOSAL TATTOO INJECTION;  Surgeon: Toney Reil, MD;  Location: [facility] ENDOSCOPY;  Service:  Gastroenterology;;    Social History   Socioeconomic History   Marital status: Widowed    Spouse name: Not on file   Number of children: 1   Years of education: Not on file   Highest education level: Not on file  Occupational History   Not on file  Tobacco Use   Smoking status: Every Day    Current packs/day: 1.00    Average packs/day: 1 pack/day for 43.7 years (43.7 ttl pk-yrs)    Types: Cigarettes    Start date: 03/15/1979   Smokeless tobacco: Never  Vaping Use   Vaping status: Never Used  Substance and Sexual Activity   Alcohol use: No    Alcohol/week: 3.0 standard drinks of alcohol    Types: 3 Standard drinks or equivalent per week   Drug use: Not Currently   Sexual activity: Not Currently    Birth control/protection: None  Other Topics Concern   Not on file  Social History Narrative   Lives with mother   Social Determinants of Health   Financial Resource Strain: Low Risk  (01/06/2022)   Overall Financial Resource Strain (CARDIA)    Difficulty of Paying Living Expenses: Not very hard  Food Insecurity: No Food Insecurity (01/06/2022)   Hunger Vital Sign    Worried About Running Out of Food in the Last Year: Never true    Ran Out of Food in the Last Year: Never true  Transportation Needs: No Transportation Needs (01/06/2022)   PRAPARE - Transportation

## 2022-12-14 ENCOUNTER — Telehealth: Payer: Self-pay | Admitting: Student in an Organized Health Care Education/Training Program

## 2022-12-14 ENCOUNTER — Encounter: Payer: Self-pay | Admitting: Student in an Organized Health Care Education/Training Program

## 2022-12-14 ENCOUNTER — Ambulatory Visit: Payer: Medicaid Other | Admitting: Student in an Organized Health Care Education/Training Program

## 2022-12-14 VITALS — BP 136/82 | HR 95 | Temp 97.6°F | Ht 62.0 in | Wt 123.6 lb

## 2022-12-14 DIAGNOSIS — R0602 Shortness of breath: Secondary | ICD-10-CM

## 2022-12-14 DIAGNOSIS — F17210 Nicotine dependence, cigarettes, uncomplicated: Secondary | ICD-10-CM | POA: Diagnosis not present

## 2022-12-14 DIAGNOSIS — R911 Solitary pulmonary nodule: Secondary | ICD-10-CM

## 2022-12-14 DIAGNOSIS — C50911 Malignant neoplasm of unspecified site of right female breast: Secondary | ICD-10-CM | POA: Diagnosis not present

## 2022-12-14 NOTE — Progress Notes (Signed)
Assessment & Plan:   #Lung nodules #History of Breast Cancer (triple negative) #Biopsy of the rectum with carcinoma   Patient is presenting for the evaluation of bilateral pulmonary nodules (right upper lobe and lingula) that have shown growth on serial imaging with FDG avidity.  This is highly concerning for a metastatic process from her breast cancer. The finding of carcinoma on her rectal biopsy is also concerning for a metastatic process, but does increase the diagnostic uncertainty as these could represent a separate primary rectal tumor.   We discussed the importance of diagnosis and staging in lung malignancies, and the approach to obtaining a tissue diagnosis which would include robotic assisted navigational bronchoscopy with endobronchial ultrasound guided sampling.  We also discussed the risks associated with the procedure which include a 2% risk of pneumothorax, infection, bleeding, and nondiagnostic procedure in detail.  I explained that patients typically are able to return home the same day of the procedure, but in rare cases admission to the hospital for observation and treatment is required.   After our discussion, the patient elected to proceed with the procedure. I will plan for robotic navigation to the LUL and RUL nodules on 12/29/2022. We will also perform EBUS for evaluation of the hilar and mediastinal lymph nodes.  - CT SUPER D CHEST WO CONTRAST; Future - Procedural/ Surgical Case Request: ROBOTIC ASSISTED NAVIGATIONAL BRONCHOSCOPY; Future   #Shortness of breath   Reports shortness of breath that persists and is unchanged. There's no increase in symptoms. Patient is yet to have her PFT's performed. She continues to smoke, and was counseled on the importance of smoking cessation. I will await the result of her PFT's prior to making a diagnosis of obstructive lung disease.   -PFT's   No follow-ups on file.  I spent 30 minutes caring for this patient today,  including preparing to see the patient, obtaining a medical history , reviewing a separately obtained history, performing a medically appropriate examination and/or evaluation, counseling and educating the patient/family/caregiver, ordering medications, tests, or procedures, and documenting clinical information in the electronic health record. 3 minutes spent on smoking cessation counseling.  Raechel Chute, MD [facility] 12/14/2022 12:03 PM    End of visit medications:  No orders of the defined types were placed in this encounter.    Current Outpatient Medications:    Calcium Carb-Cholecalciferol (OYSTER SHELL CALCIUM W/D) 500-5 MG-MCG TABS, Take by mouth., Disp: , Rfl:    calcium carbonate (TUMS EX) 750 MG chewable tablet, Chew 2 tablets by mouth 4 (four) times daily as needed for heartburn., Disp: , Rfl:    fluticasone (FLONASE) 50 MCG/ACT nasal spray, Place 1 spray into both nostrils daily., Disp: , Rfl:    ibuprofen (ADVIL) 200 MG tablet, Take 200-400 mg by mouth every 6 (six) hours as needed for headache., Disp: , Rfl:    lidocaine-prilocaine (EMLA) cream, APPLY TO AFFECTED AREA ONCE, Disp: 30 g, Rfl: 0   magnesium chloride (SLOW-MAG) 64 MG TBEC SR tablet, Take 1 tablet (64 mg total) by mouth 2 (two) times daily., Disp: 60 tablet, Rfl: 2   naloxone (NARCAN) nasal spray 4 mg/0.1 mL, , Disp: , Rfl:    omeprazole (PRILOSEC) 40 MG capsule, Take 1 capsule (40 mg total) by mouth daily., Disp: 90 capsule, Rfl: 0   potassium chloride (MICRO-K) 10 MEQ CR capsule, TAKE 1 CAPSULE BY MOUTH ONCE DAILY WITH FUROSEMIDE TO AVOID LOW POTASSIUM OR CRAMPS, Disp: 30 capsule, Rfl: 0  VENTOLIN HFA 108 (90 Base) MCG/ACT inhaler, Inhale 1 puff into the lungs every 6 (six) hours as needed for wheezing or shortness of breath., Disp: 18 g, Rfl: 0   Vitamin D, Ergocalciferol, (DRISDOL) 1.25 MG (50000 UNIT) CAPS capsule, Take 50,000 Units by mouth once a week., Disp: , Rfl:     Fluticasone-Umeclidin-Vilant (TRELEGY ELLIPTA) 100-62.5-25 MCG/ACT AEPB, Inhale 1 Inhalation into the lungs daily. (Patient not taking: Reported on 12/14/2022), Disp: 60 each, Rfl: 1 No current facility-administered medications for this visit.  Facility-Administered Medications Ordered in Other Visits:    famotidine (PEPCID) 20-0.9 MG/50ML-% IVPB, , , ,    Subjective:   PATIENT ID: Yvonne Scott GENDER: female DOB: Mar 02, 1961, MRN: [id number]  Chief Complaint  Patient presents with   Follow-up    Cough, shortness of breath and wheezing.     HPI  Patient is a pleasant 62 year old female presenting to clinic for follow up on Pulmonary nodule.   Patient has a history of breast cancer for which she is followed by Dr. Cathie Hoops from oncology.  She was seen by Dr. Cathie Hoops on August 2024 following surveillance imaging in July (CT scan of the chest and PET/CT) showing growth in the pulmonary nodules in the right upper lobe and lingula.  Patient follows with Dr. Cathie Hoops for stage III triple negative right breast cancer status post neoadjuvant carboplatin, Taxol, and Keytruda followed by adjuvant chemotherapy with [facility] which she reportedly tolerated.  She is status post right mastectomy with sentinel lymph node biopsy with residual disease.  The CT scan of the chest and showed enlarging right upper lobe and lingular pulmonary nodules which were both PET avid.  Imaging was also noted for a possible rectal mass for which she was referred to GI and underwent colonoscopy on 12/07/2022. 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  Today, patient is presenting to discuss these results and for consideration of biopsy. Following our last visit, I did treat her for a presumed COPD exacerbation with steroids and antibiotics with improvement. She was to have PFT's but has yet to have them scheduled. She was prescribed Trelegy but hasn't picked them up yet. She continues to smoke. Shortness of  breath persists, but there's no increase in chest tightness, cough, or sputum production. No fever or chills reported.    Patient is a current smoker and is smoking 1 pack/day with around 40 to 50 pack years of smoking history.  She does not have the will to quit at this point.  She does not report any significant occupational exposures. Patient's past medical history is notable for DKA in 2021 (elevated Beta-Hydroxybutyric acid on admission).  Ancillary information including prior medications, full medical/surgical/family/social histories, and PFTs (when available) are listed below and have been reviewed.   Review of Systems  Constitutional:  Negative for chills, fever, malaise/fatigue and weight loss.  Respiratory:  Positive for shortness of breath. Negative for cough, hemoptysis, sputum production and wheezing.   Cardiovascular:  Negative for chest pain.     Objective:   Vitals:   12/14/22 1056  BP: 136/82  Pulse: 95  Temp: 97.6 F (36.4 C)  TempSrc: Temporal  SpO2: 99%  Weight: 123 lb 9.6 oz (56.1 kg)  Height: 5\' 2"  (1.575 m)   99% on RA  BMI Readings from Last 3 Encounters:  12/14/22 22.61 kg/m  12/12/22 22.61 kg/m  12/07/22 21.95 kg/m   Wt Readings from Last 3 Encounters:  12/14/22 123 lb 9.6 oz (56.1  kg)  12/12/22 123 lb 9.6 oz (56.1 kg)  12/07/22 120 lb (54.4 kg)    Physical Exam Constitutional:      Appearance: Normal appearance. She is normal weight.  HENT:     Mouth/Throat:     Mouth: Mucous membranes are moist.  Cardiovascular:     Rate and Rhythm: Normal rate and regular rhythm.     Pulses: Normal pulses.     Heart sounds: Normal heart sounds.  Pulmonary:     Effort: Pulmonary effort is normal. No respiratory distress.     Breath sounds: Normal breath sounds. No wheezing, rhonchi or rales.  Neurological:     General: No focal deficit present.     Mental Status: She is alert and oriented to person, place, and time. Mental status is at baseline.        Ancillary Information    Past Medical History:  Diagnosis Date   Acute kidney injury (HCC)    Anxiety    Aortic atherosclerosis (HCC) 10/24/2016   Breast cancer (HCC)    Chemotherapy-induced neuropathy (HCC)    Essential hypertension    GERD (gastroesophageal reflux disease)    Hypokalemia    Hypomagnesemia    Invasive ductal carcinoma of right breast (HCC)    Metabolic acidosis    Personal history of chemotherapy    Pneumonia    Severe protein-calorie malnutrition (HCC)    Thrombocytopenia (HCC)      Family History  Problem Relation Age of Onset   Cancer Mother 2       breast   Cancer Father        melonma   Cirrhosis Maternal Grandfather    Cancer Paternal Grandmother        skin/breast     Past Surgical History:  Procedure Laterality Date   BREAST BIOPSY Right 2022   COLONOSCOPY WITH PROPOFOL N/A 12/07/2022   Procedure: COLONOSCOPY WITH PROPOFOL;  Surgeon: Toney Reil, MD;  Location: [facility] ENDOSCOPY;  Service: Gastroenterology;  Laterality: N/A;   FOOT SURGERY Left    little toe correction   MASTECTOMY W/ SENTINEL NODE BIOPSY Right 10/19/2021   Procedure: MASTECTOMY WITH SENTINEL LYMPH NODE BIOPSY ( modified radical);  Surgeon: Carolan Shiver, MD;  Location: [facility] ORS;  Service: General;  Laterality: Right;   POLYPECTOMY  12/07/2022   Procedure: POLYPECTOMY;  Surgeon: Toney Reil, MD;  Location: [facility] ENDOSCOPY;  Service: Gastroenterology;;   PORTA CATH INSERTION  01/19/2021   SUBMUCOSAL TATTOO INJECTION  12/07/2022   Procedure: SUBMUCOSAL TATTOO INJECTION;  Surgeon: Toney Reil, MD;  Location: [facility] ENDOSCOPY;  Service: Gastroenterology;;    Social History   Socioeconomic History   Marital status: Widowed    Spouse name: Not on file   Number of children: 1   Years of education: Not on file   Highest education level: Not on file  Occupational History   Not on file  Tobacco Use   Smoking status: Every Day    Current  packs/day: 1.00    Average packs/day: 1 pack/day for 43.8 years (43.8 ttl pk-yrs)    Types: Cigarettes    Start date: 03/15/1979   Smokeless tobacco: Never  Vaping Use   Vaping status: Never Used  Substance and Sexual Activity   Alcohol use: No    Alcohol/week: 3.0 standard drinks of alcohol    Types: 3 Standard drinks or equivalent per week   Drug use: Not Currently   Sexual activity: Not Currently    Birth control/protection:  None  Other Topics Concern   Not on file  Social History Narrative   Lives with mother   Social Determinants of Health   Financial Resource Strain: Low Risk  (01/06/2022)   Overall Financial Resource Strain (CARDIA)    Difficulty of Paying Living Expenses: Not very hard  Food Insecurity: No Food Insecurity (01/06/2022)   Hunger Vital Sign    Worried About Running Out of Food in the Last Year: Never true    Ran Out of Food in the Last Year: Never true  Transportation Needs: No Transportation Needs (01/06/2022)   PRAPARE - Administrator, Civil Service (Medical): No    Lack of Transportation (Non-Medical): No  Physical Activity: Inactive (01/06/2022)   Exercise Vital Sign    Days of Exercise per Week: 0 days    Minutes of Exercise per Session: 0 min  Stress: Stress Concern Present (01/06/2022)   [employer] of Occupational Health - Occupational Stress Questionnaire    Feeling of Stress : Rather much  Social Connections: Moderately Isolated (01/06/2022)   Social Connection and Isolation Panel [NHANES]    Frequency of Communication with Friends and Family: Once a week    Frequency of Social Gatherings with Friends and Family: Once a week    Attends Religious Services: 1 to 4 times per year    Active Member of [employer] or Organizations: Yes    Attends Banker Meetings: Never    Marital Status: Widowed  Intimate Partner Violence: Not At Risk (01/06/2022)   Humiliation, Afraid, Rape, and Kick questionnaire    Fear of Current  or Ex-Partner: No    Emotionally Abused: No    Physically Abused: No    Sexually Abused: No     No Known Allergies   CBC    Component Value Date/Time   WBC 9.5 10/13/2022 1501   WBC 10.1 04/27/2022 1102   RBC 4.27 10/13/2022 1501   HGB 13.2 10/13/2022 1501   HGB 13.9 06/17/2022 1349   HCT 37.9 10/13/2022 1501   HCT 41.6 06/17/2022 1349   PLT 225 10/13/2022 1501   PLT 272 06/17/2022 1349   MCV 88.8 10/13/2022 1501   MCV 91 06/17/2022 1349   MCV 100 02/17/2014 0323   MCH 30.9 10/13/2022 1501   MCHC 34.8 10/13/2022 1501   RDW 13.2 10/13/2022 1501   RDW 14.5 06/17/2022 1349   RDW 14.1 02/17/2014 0323   LYMPHSABS 3.2 10/13/2022 1501   LYMPHSABS 1.8 06/17/2022 1349   LYMPHSABS 3.2 02/17/2014 0323   MONOABS 0.7 10/13/2022 1501   MONOABS 0.4 02/17/2014 0323   EOSABS 0.2 10/13/2022 1501   EOSABS 0.0 06/17/2022 1349   EOSABS 0.0 02/17/2014 0323   BASOSABS 0.0 10/13/2022 1501   BASOSABS 0.0 06/17/2022 1349   BASOSABS 0.1 02/17/2014 0323    Pulmonary Functions Testing Results:     No data to display          Outpatient Medications Prior to Visit  Medication Sig Dispense Refill   Calcium Carb-Cholecalciferol (OYSTER SHELL CALCIUM W/D) 500-5 MG-MCG TABS Take by mouth.     calcium carbonate (TUMS EX) 750 MG chewable tablet Chew 2 tablets by mouth 4 (four) times daily as needed for heartburn.     fluticasone (FLONASE) 50 MCG/ACT nasal spray Place 1 spray into both nostrils daily.     ibuprofen (ADVIL) 200 MG tablet Take 200-400 mg by mouth every 6 (six) hours as needed for headache.     lidocaine-prilocaine (EMLA)  cream APPLY TO AFFECTED AREA ONCE 30 g 0   magnesium chloride (SLOW-MAG) 64 MG TBEC SR tablet Take 1 tablet (64 mg total) by mouth 2 (two) times daily. 60 tablet 2   naloxone (NARCAN) nasal spray 4 mg/0.1 mL      omeprazole (PRILOSEC) 40 MG capsule Take 1 capsule (40 mg total) by mouth daily. 90 capsule 0   potassium chloride (MICRO-K) 10 MEQ CR capsule TAKE 1  CAPSULE BY MOUTH ONCE DAILY WITH FUROSEMIDE TO AVOID LOW POTASSIUM OR CRAMPS 30 capsule 0   VENTOLIN HFA 108 (90 Base) MCG/ACT inhaler Inhale 1 puff into the lungs every 6 (six) hours as needed for wheezing or shortness of breath. 18 g 0   Vitamin D, Ergocalciferol, (DRISDOL) 1.25 MG (50000 UNIT) CAPS capsule Take 50,000 Units by mouth once a week.     Fluticasone-Umeclidin-Vilant (TRELEGY ELLIPTA) 100-62.5-25 MCG/ACT AEPB Inhale 1 Inhalation into the lungs daily. (Patient not taking: Reported on 12/14/2022) 60 each 1   Facility-Administered Medications Prior to Visit  Medication Dose Route Frequency Provider Last Rate Last Admin   famotidine (PEPCID) 20-0.9 MG/50ML-% IVPB

## 2022-12-14 NOTE — H&P (View-Only) (Signed)
Assessment & Plan:   #Lung nodules #History of Breast Cancer (triple negative) #Biopsy of the rectum with carcinoma   Patient is presenting for the evaluation of bilateral pulmonary nodules (right upper lobe and lingula) that have shown growth on serial imaging with FDG avidity.  This is highly concerning for a metastatic process from her breast cancer. The finding of carcinoma on her rectal biopsy is also concerning for a metastatic process, but does increase the diagnostic uncertainty as these could represent a separate primary rectal tumor.   We discussed the importance of diagnosis and staging in lung malignancies, and the approach to obtaining a tissue diagnosis which would include robotic assisted navigational bronchoscopy with endobronchial ultrasound guided sampling.  We also discussed the risks associated with the procedure which include a 2% risk of pneumothorax, infection, bleeding, and nondiagnostic procedure in detail.  I explained that patients typically are able to return home the same day of the procedure, but in rare cases admission to the hospital for observation and treatment is required.   After our discussion, the patient elected to proceed with the procedure. I will plan for robotic navigation to the LUL and RUL nodules on 12/29/2022. We will also perform EBUS for evaluation of the hilar and mediastinal lymph nodes.  - CT SUPER D CHEST WO CONTRAST; Future - Procedural/ Surgical Case Request: ROBOTIC ASSISTED NAVIGATIONAL BRONCHOSCOPY; Future   #Shortness of breath   Reports shortness of breath that persists and is unchanged. There's no increase in symptoms. Patient is yet to have her PFT's performed. She continues to smoke, and was counseled on the importance of smoking cessation. I will await the result of her PFT's prior to making a diagnosis of obstructive lung disease.   -PFT's   No follow-ups on file.  I spent 30 minutes caring for this patient today,  including preparing to see the patient, obtaining a medical history , reviewing a separately obtained history, performing a medically appropriate examination and/or evaluation, counseling and educating the patient/family/caregiver, ordering medications, tests, or procedures, and documenting clinical information in the electronic health record. 3 minutes spent on smoking cessation counseling.  Raechel Chute, MD [facility] 12/14/2022 12:03 PM    End of visit medications:  No orders of the defined types were placed in this encounter.    Current Outpatient Medications:    Calcium Carb-Cholecalciferol (OYSTER SHELL CALCIUM W/D) 500-5 MG-MCG TABS, Take by mouth., Disp: , Rfl:    calcium carbonate (TUMS EX) 750 MG chewable tablet, Chew 2 tablets by mouth 4 (four) times daily as needed for heartburn., Disp: , Rfl:    fluticasone (FLONASE) 50 MCG/ACT nasal spray, Place 1 spray into both nostrils daily., Disp: , Rfl:    ibuprofen (ADVIL) 200 MG tablet, Take 200-400 mg by mouth every 6 (six) hours as needed for headache., Disp: , Rfl:    lidocaine-prilocaine (EMLA) cream, APPLY TO AFFECTED AREA ONCE, Disp: 30 g, Rfl: 0   magnesium chloride (SLOW-MAG) 64 MG TBEC SR tablet, Take 1 tablet (64 mg total) by mouth 2 (two) times daily., Disp: 60 tablet, Rfl: 2   naloxone (NARCAN) nasal spray 4 mg/0.1 mL, , Disp: , Rfl:    omeprazole (PRILOSEC) 40 MG capsule, Take 1 capsule (40 mg total) by mouth daily., Disp: 90 capsule, Rfl: 0   potassium chloride (MICRO-K) 10 MEQ CR capsule, TAKE 1 CAPSULE BY MOUTH ONCE DAILY WITH FUROSEMIDE TO AVOID LOW POTASSIUM OR CRAMPS, Disp: 30 capsule, Rfl: 0  VENTOLIN HFA 108 (90 Base) MCG/ACT inhaler, Inhale 1 puff into the lungs every 6 (six) hours as needed for wheezing or shortness of breath., Disp: 18 g, Rfl: 0   Vitamin D, Ergocalciferol, (DRISDOL) 1.25 MG (50000 UNIT) CAPS capsule, Take 50,000 Units by mouth once a week., Disp: , Rfl:     Fluticasone-Umeclidin-Vilant (TRELEGY ELLIPTA) 100-62.5-25 MCG/ACT AEPB, Inhale 1 Inhalation into the lungs daily. (Patient not taking: Reported on 12/14/2022), Disp: 60 each, Rfl: 1 No current facility-administered medications for this visit.  Facility-Administered Medications Ordered in Other Visits:    famotidine (PEPCID) 20-0.9 MG/50ML-% IVPB, , , ,    Subjective:   PATIENT ID: Yvonne Scott GENDER: female DOB: Mar 02, 1961, MRN: [id number]  Chief Complaint  Patient presents with   Follow-up    Cough, shortness of breath and wheezing.     HPI  Patient is a pleasant 62 year old female presenting to clinic for follow up on Pulmonary nodule.   Patient has a history of breast cancer for which she is followed by Dr. Cathie Hoops from oncology.  She was seen by Dr. Cathie Hoops on August 2024 following surveillance imaging in July (CT scan of the chest and PET/CT) showing growth in the pulmonary nodules in the right upper lobe and lingula.  Patient follows with Dr. Cathie Hoops for stage III triple negative right breast cancer status post neoadjuvant carboplatin, Taxol, and Keytruda followed by adjuvant chemotherapy with [facility] which she reportedly tolerated.  She is status post right mastectomy with sentinel lymph node biopsy with residual disease.  The CT scan of the chest and showed enlarging right upper lobe and lingular pulmonary nodules which were both PET avid.  Imaging was also noted for a possible rectal mass for which she was referred to GI and underwent colonoscopy on 12/07/2022. 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  Today, patient is presenting to discuss these results and for consideration of biopsy. Following our last visit, I did treat her for a presumed COPD exacerbation with steroids and antibiotics with improvement. She was to have PFT's but has yet to have them scheduled. She was prescribed Trelegy but hasn't picked them up yet. She continues to smoke. Shortness of  breath persists, but there's no increase in chest tightness, cough, or sputum production. No fever or chills reported.    Patient is a current smoker and is smoking 1 pack/day with around 40 to 50 pack years of smoking history.  She does not have the will to quit at this point.  She does not report any significant occupational exposures. Patient's past medical history is notable for DKA in 2021 (elevated Beta-Hydroxybutyric acid on admission).  Ancillary information including prior medications, full medical/surgical/family/social histories, and PFTs (when available) are listed below and have been reviewed.   Review of Systems  Constitutional:  Negative for chills, fever, malaise/fatigue and weight loss.  Respiratory:  Positive for shortness of breath. Negative for cough, hemoptysis, sputum production and wheezing.   Cardiovascular:  Negative for chest pain.     Objective:   Vitals:   12/14/22 1056  BP: 136/82  Pulse: 95  Temp: 97.6 F (36.4 C)  TempSrc: Temporal  SpO2: 99%  Weight: 123 lb 9.6 oz (56.1 kg)  Height: 5\' 2"  (1.575 m)   99% on RA  BMI Readings from Last 3 Encounters:  12/14/22 22.61 kg/m  12/12/22 22.61 kg/m  12/07/22 21.95 kg/m   Wt Readings from Last 3 Encounters:  12/14/22 123 lb 9.6 oz (56.1  kg)  12/12/22 123 lb 9.6 oz (56.1 kg)  12/07/22 120 lb (54.4 kg)    Physical Exam Constitutional:      Appearance: Normal appearance. She is normal weight.  HENT:     Mouth/Throat:     Mouth: Mucous membranes are moist.  Cardiovascular:     Rate and Rhythm: Normal rate and regular rhythm.     Pulses: Normal pulses.     Heart sounds: Normal heart sounds.  Pulmonary:     Effort: Pulmonary effort is normal. No respiratory distress.     Breath sounds: Normal breath sounds. No wheezing, rhonchi or rales.  Neurological:     General: No focal deficit present.     Mental Status: She is alert and oriented to person, place, and time. Mental status is at baseline.        Ancillary Information    Past Medical History:  Diagnosis Date   Acute kidney injury (HCC)    Anxiety    Aortic atherosclerosis (HCC) 10/24/2016   Breast cancer (HCC)    Chemotherapy-induced neuropathy (HCC)    Essential hypertension    GERD (gastroesophageal reflux disease)    Hypokalemia    Hypomagnesemia    Invasive ductal carcinoma of right breast (HCC)    Metabolic acidosis    Personal history of chemotherapy    Pneumonia    Severe protein-calorie malnutrition (HCC)    Thrombocytopenia (HCC)      Family History  Problem Relation Age of Onset   Cancer Mother 2       breast   Cancer Father        melonma   Cirrhosis Maternal Grandfather    Cancer Paternal Grandmother        skin/breast     Past Surgical History:  Procedure Laterality Date   BREAST BIOPSY Right 2022   COLONOSCOPY WITH PROPOFOL N/A 12/07/2022   Procedure: COLONOSCOPY WITH PROPOFOL;  Surgeon: Toney Reil, MD;  Location: [facility] ENDOSCOPY;  Service: Gastroenterology;  Laterality: N/A;   FOOT SURGERY Left    little toe correction   MASTECTOMY W/ SENTINEL NODE BIOPSY Right 10/19/2021   Procedure: MASTECTOMY WITH SENTINEL LYMPH NODE BIOPSY ( modified radical);  Surgeon: Carolan Shiver, MD;  Location: [facility] ORS;  Service: General;  Laterality: Right;   POLYPECTOMY  12/07/2022   Procedure: POLYPECTOMY;  Surgeon: Toney Reil, MD;  Location: [facility] ENDOSCOPY;  Service: Gastroenterology;;   PORTA CATH INSERTION  01/19/2021   SUBMUCOSAL TATTOO INJECTION  12/07/2022   Procedure: SUBMUCOSAL TATTOO INJECTION;  Surgeon: Toney Reil, MD;  Location: [facility] ENDOSCOPY;  Service: Gastroenterology;;    Social History   Socioeconomic History   Marital status: Widowed    Spouse name: Not on file   Number of children: 1   Years of education: Not on file   Highest education level: Not on file  Occupational History   Not on file  Tobacco Use   Smoking status: Every Day    Current  packs/day: 1.00    Average packs/day: 1 pack/day for 43.8 years (43.8 ttl pk-yrs)    Types: Cigarettes    Start date: 03/15/1979   Smokeless tobacco: Never  Vaping Use   Vaping status: Never Used  Substance and Sexual Activity   Alcohol use: No    Alcohol/week: 3.0 standard drinks of alcohol    Types: 3 Standard drinks or equivalent per week   Drug use: Not Currently   Sexual activity: Not Currently    Birth control/protection:  None  Other Topics Concern   Not on file  Social History Narrative   Lives with mother   Social Determinants of Health   Financial Resource Strain: Low Risk  (01/06/2022)   Overall Financial Resource Strain (CARDIA)    Difficulty of Paying Living Expenses: Not very hard  Food Insecurity: No Food Insecurity (01/06/2022)   Hunger Vital Sign    Worried About Running Out of Food in the Last Year: Never true    Ran Out of Food in the Last Year: Never true  Transportation Needs: No Transportation Needs (01/06/2022)   PRAPARE - Administrator, Civil Service (Medical): No    Lack of Transportation (Non-Medical): No  Physical Activity: Inactive (01/06/2022)   Exercise Vital Sign    Days of Exercise per Week: 0 days    Minutes of Exercise per Session: 0 min  Stress: Stress Concern Present (01/06/2022)   [employer] of Occupational Health - Occupational Stress Questionnaire    Feeling of Stress : Rather much  Social Connections: Moderately Isolated (01/06/2022)   Social Connection and Isolation Panel [NHANES]    Frequency of Communication with Friends and Family: Once a week    Frequency of Social Gatherings with Friends and Family: Once a week    Attends Religious Services: 1 to 4 times per year    Active Member of [employer] or Organizations: Yes    Attends Banker Meetings: Never    Marital Status: Widowed  Intimate Partner Violence: Not At Risk (01/06/2022)   Humiliation, Afraid, Rape, and Kick questionnaire    Fear of Current  or Ex-Partner: No    Emotionally Abused: No    Physically Abused: No    Sexually Abused: No     No Known Allergies   CBC    Component Value Date/Time   WBC 9.5 10/13/2022 1501   WBC 10.1 04/27/2022 1102   RBC 4.27 10/13/2022 1501   HGB 13.2 10/13/2022 1501   HGB 13.9 06/17/2022 1349   HCT 37.9 10/13/2022 1501   HCT 41.6 06/17/2022 1349   PLT 225 10/13/2022 1501   PLT 272 06/17/2022 1349   MCV 88.8 10/13/2022 1501   MCV 91 06/17/2022 1349   MCV 100 02/17/2014 0323   MCH 30.9 10/13/2022 1501   MCHC 34.8 10/13/2022 1501   RDW 13.2 10/13/2022 1501   RDW 14.5 06/17/2022 1349   RDW 14.1 02/17/2014 0323   LYMPHSABS 3.2 10/13/2022 1501   LYMPHSABS 1.8 06/17/2022 1349   LYMPHSABS 3.2 02/17/2014 0323   MONOABS 0.7 10/13/2022 1501   MONOABS 0.4 02/17/2014 0323   EOSABS 0.2 10/13/2022 1501   EOSABS 0.0 06/17/2022 1349   EOSABS 0.0 02/17/2014 0323   BASOSABS 0.0 10/13/2022 1501   BASOSABS 0.0 06/17/2022 1349   BASOSABS 0.1 02/17/2014 0323    Pulmonary Functions Testing Results:     No data to display          Outpatient Medications Prior to Visit  Medication Sig Dispense Refill   Calcium Carb-Cholecalciferol (OYSTER SHELL CALCIUM W/D) 500-5 MG-MCG TABS Take by mouth.     calcium carbonate (TUMS EX) 750 MG chewable tablet Chew 2 tablets by mouth 4 (four) times daily as needed for heartburn.     fluticasone (FLONASE) 50 MCG/ACT nasal spray Place 1 spray into both nostrils daily.     ibuprofen (ADVIL) 200 MG tablet Take 200-400 mg by mouth every 6 (six) hours as needed for headache.     lidocaine-prilocaine (EMLA)  cream APPLY TO AFFECTED AREA ONCE 30 g 0   magnesium chloride (SLOW-MAG) 64 MG TBEC SR tablet Take 1 tablet (64 mg total) by mouth 2 (two) times daily. 60 tablet 2   naloxone (NARCAN) nasal spray 4 mg/0.1 mL      omeprazole (PRILOSEC) 40 MG capsule Take 1 capsule (40 mg total) by mouth daily. 90 capsule 0   potassium chloride (MICRO-K) 10 MEQ CR capsule TAKE 1  CAPSULE BY MOUTH ONCE DAILY WITH FUROSEMIDE TO AVOID LOW POTASSIUM OR CRAMPS 30 capsule 0   VENTOLIN HFA 108 (90 Base) MCG/ACT inhaler Inhale 1 puff into the lungs every 6 (six) hours as needed for wheezing or shortness of breath. 18 g 0   Vitamin D, Ergocalciferol, (DRISDOL) 1.25 MG (50000 UNIT) CAPS capsule Take 50,000 Units by mouth once a week.     Fluticasone-Umeclidin-Vilant (TRELEGY ELLIPTA) 100-62.5-25 MCG/ACT AEPB Inhale 1 Inhalation into the lungs daily. (Patient not taking: Reported on 12/14/2022) 60 each 1   Facility-Administered Medications Prior to Visit  Medication Dose Route Frequency Provider Last Rate Last Admin   famotidine (PEPCID) 20-0.9 MG/50ML-% IVPB

## 2022-12-16 ENCOUNTER — Telehealth: Payer: Self-pay

## 2022-12-16 NOTE — Telephone Encounter (Signed)
Pt scheduled for Bronchoscopy on 12/29/22, please schedule pt for MD only approx 1 week after that date for biopsy results. please inform pt of appt details

## 2022-12-19 ENCOUNTER — Encounter: Payer: Self-pay | Admitting: Oncology

## 2022-12-27 ENCOUNTER — Encounter (HOSPITAL_COMMUNITY): Payer: Self-pay | Admitting: Student in an Organized Health Care Education/Training Program

## 2022-12-27 NOTE — Progress Notes (Signed)
SDW call  Patient was given pre-op instructions over the phone. Patient verbalized understanding of instructions provided.     PCP/cardiology - Marisue Ivan, NP Oncologist: Dr. Rickard Patience Pulmonary:    PPM/ICD - denies Device Orders - na Rep Notified - na   Chest x-ray - na EKG -  DOS, 12/29/2022 Stress Test - ECHO - 07/13/2021 Cardiac Cath -   Sleep Study/sleep apnea/CPAP: denies  Non-diabetic   Blood Thinner Instructions: denies Aspirin Instructions:denies   ERAS Protcol - NPO   COVID TEST- na    Anesthesia review: Yes.  HTN, acute kidney injury, thrombocytopenia, aortic atherosclerosis,    Patient denies shortness of breath, fever, cough and chest pain over the phone call  Your procedure is scheduled on Thursday December 29, 2022  Report to [facility] Main Entrance "A" at 0630 A.M., then check in with the Admitting office.  Call this number if you have problems the morning of surgery:  [phone]   If you have any questions prior to your surgery date call [phone]: Open Monday-Friday 8am-4pm If you experience any cold or flu symptoms such as cough, fever, chills, shortness of breath, etc. between now and your scheduled surgery, please notify us at the above number    Remember:  Do not eat or drink after midnight the night before your surgery  Take these medicines the morning of surgery with A SIP OF WATER:  Omeprazole  As needed: Flonase, ventolin  As of today, STOP taking any Aspirin (unless otherwise instructed by your surgeon) Aleve, Naproxen, Ibuprofen, Motrin, Advil, Goody's, BC's, all herbal medications, fish oil, and all vitamins.

## 2022-12-28 ENCOUNTER — Ambulatory Visit
Admission: RE | Admit: 2022-12-28 | Discharge: 2022-12-28 | Disposition: A | Discharge status: Home / Self Care | Payer: Medicaid Other | Source: Ambulatory Visit | Attending: Student in an Organized Health Care Education/Training Program | Admitting: Student in an Organized Health Care Education/Training Program

## 2022-12-28 DIAGNOSIS — R911 Solitary pulmonary nodule: Secondary | ICD-10-CM | POA: Diagnosis present

## 2022-12-29 ENCOUNTER — Ambulatory Visit (HOSPITAL_COMMUNITY): Payer: Medicaid Other

## 2022-12-29 ENCOUNTER — Ambulatory Visit (HOSPITAL_COMMUNITY)
Admission: RE | Admit: 2022-12-29 | Discharge: 2022-12-29 | Disposition: A | Discharge status: Home / Self Care | Payer: Medicaid Other | Attending: Student in an Organized Health Care Education/Training Program | Admitting: Student in an Organized Health Care Education/Training Program

## 2022-12-29 ENCOUNTER — Ambulatory Visit (HOSPITAL_COMMUNITY): Payer: Self-pay | Admitting: Physician Assistant

## 2022-12-29 ENCOUNTER — Encounter (HOSPITAL_COMMUNITY): Payer: Self-pay | Admitting: Student in an Organized Health Care Education/Training Program

## 2022-12-29 ENCOUNTER — Encounter (HOSPITAL_COMMUNITY)
Admission: RE | Disposition: A | Discharge status: Home / Self Care | Payer: Self-pay | Source: Home / Self Care | Attending: Student in an Organized Health Care Education/Training Program

## 2022-12-29 ENCOUNTER — Ambulatory Visit (HOSPITAL_BASED_OUTPATIENT_CLINIC_OR_DEPARTMENT_OTHER): Payer: Medicaid Other | Admitting: Physician Assistant

## 2022-12-29 DIAGNOSIS — Z9221 Personal history of antineoplastic chemotherapy: Secondary | ICD-10-CM | POA: Diagnosis not present

## 2022-12-29 DIAGNOSIS — Z853 Personal history of malignant neoplasm of breast: Secondary | ICD-10-CM | POA: Insufficient documentation

## 2022-12-29 DIAGNOSIS — C3411 Malignant neoplasm of upper lobe, right bronchus or lung: Secondary | ICD-10-CM | POA: Insufficient documentation

## 2022-12-29 DIAGNOSIS — R911 Solitary pulmonary nodule: Secondary | ICD-10-CM | POA: Diagnosis not present

## 2022-12-29 DIAGNOSIS — I10 Essential (primary) hypertension: Secondary | ICD-10-CM | POA: Diagnosis not present

## 2022-12-29 DIAGNOSIS — C20 Malignant neoplasm of rectum: Secondary | ICD-10-CM | POA: Diagnosis not present

## 2022-12-29 DIAGNOSIS — C3491 Malignant neoplasm of unspecified part of right bronchus or lung: Secondary | ICD-10-CM | POA: Insufficient documentation

## 2022-12-29 DIAGNOSIS — R918 Other nonspecific abnormal finding of lung field: Secondary | ICD-10-CM | POA: Diagnosis present

## 2022-12-29 DIAGNOSIS — F17210 Nicotine dependence, cigarettes, uncomplicated: Secondary | ICD-10-CM | POA: Diagnosis not present

## 2022-12-29 DIAGNOSIS — K219 Gastro-esophageal reflux disease without esophagitis: Secondary | ICD-10-CM | POA: Diagnosis not present

## 2022-12-29 HISTORY — PX: BRONCHIAL BIOPSY: SHX5109

## 2022-12-29 HISTORY — PX: VIDEO BRONCHOSCOPY WITH ENDOBRONCHIAL ULTRASOUND: SHX6177

## 2022-12-29 HISTORY — PX: BRONCHIAL NEEDLE ASPIRATION BIOPSY: SHX5106

## 2022-12-29 LAB — CBC
HCT: 41 % (ref 36.0–46.0)
Hemoglobin: 14.1 g/dL (ref 12.0–15.0)
MCH: 32.4 pg (ref 26.0–34.0)
MCHC: 34.4 g/dL (ref 30.0–36.0)
MCV: 94.3 fL (ref 80.0–100.0)
Platelets: 200 10*3/uL (ref 150–400)
RBC: 4.35 MIL/uL (ref 3.87–5.11)
RDW: 14.7 % (ref 11.5–15.5)
WBC: 5.9 10*3/uL (ref 4.0–10.5)
nRBC: 0 % (ref 0.0–0.2)

## 2022-12-29 LAB — BASIC METABOLIC PANEL
Anion gap: 14 (ref 5–15)
BUN: <5 mg/dL — ABNORMAL LOW (ref 8–23)
CO2: 23 mmol/L (ref 22–32)
Calcium: 9.1 mg/dL (ref 8.9–10.3)
Chloride: 101 mmol/L (ref 98–111)
Creatinine, Ser: 0.68 mg/dL (ref 0.44–1.00)
GFR, Estimated: >60 mL/min (ref >60)
Glucose, Bld: 88 mg/dL (ref 70–99)
Potassium: 3.2 mmol/L — ABNORMAL LOW (ref 3.5–5.1)
Sodium: 138 mmol/L (ref 135–145)

## 2022-12-29 SURGERY — BRONCHOSCOPY, WITH BIOPSY USING ELECTROMAGNETIC NAVIGATION
Anesthesia: General

## 2022-12-29 MED ORDER — SODIUM CHLORIDE 0.9 % IV SOLN
INTRAVENOUS | Status: DC | PRN
Start: 2022-12-29 — End: 2022-12-29

## 2022-12-29 MED ORDER — DEXAMETHASONE SODIUM PHOSPHATE 10 MG/ML IJ SOLN
INTRAMUSCULAR | Status: DC | PRN
  Administered 2022-12-29: 10 mg via INTRAVENOUS

## 2022-12-29 MED ORDER — PROPOFOL 10 MG/ML IV BOLUS
INTRAVENOUS | Status: DC | PRN
  Administered 2022-12-29: 140 mg via INTRAVENOUS

## 2022-12-29 MED ORDER — LIDOCAINE 2% (20 MG/ML) 5 ML SYRINGE
INTRAMUSCULAR | Status: DC | PRN
  Administered 2022-12-29: 40 mg via INTRAVENOUS

## 2022-12-29 MED ORDER — PROPOFOL 500 MG/50ML IV EMUL
INTRAVENOUS | Status: DC | PRN
Start: 2022-12-29 — End: 2022-12-29
  Administered 2022-12-29: 125 ug/kg/min via INTRAVENOUS

## 2022-12-29 MED ORDER — FENTANYL CITRATE (PF) 250 MCG/5ML IJ SOLN
INTRAMUSCULAR | Status: DC | PRN
  Administered 2022-12-29: 50 ug via INTRAVENOUS

## 2022-12-29 MED ORDER — CHLORHEXIDINE GLUCONATE 0.12 % MT SOLN: 15.0000 mL | Freq: Once | OROMUCOSAL | Status: AC

## 2022-12-29 MED ORDER — PHENYLEPHRINE HCL-NACL 20-0.9 MG/250ML-% IV SOLN
INTRAVENOUS | Status: DC | PRN
  Administered 2022-12-29: 60 ug/min via INTRAVENOUS

## 2022-12-29 MED ORDER — ACETAMINOPHEN 500 MG PO TABS: 1000.0000 mg | ORAL_TABLET | Freq: Once | ORAL | Status: DC | PRN

## 2022-12-29 MED ORDER — ACETAMINOPHEN 10 MG/ML IV SOLN: 1000.0000 mg | Freq: Once | INTRAVENOUS | Status: DC | PRN

## 2022-12-29 MED ORDER — ONDANSETRON HCL 4 MG/2ML IJ SOLN
INTRAMUSCULAR | Status: DC | PRN
  Administered 2022-12-29: 4 mg via INTRAVENOUS

## 2022-12-29 MED ORDER — ROCURONIUM BROMIDE 10 MG/ML (PF) SYRINGE
PREFILLED_SYRINGE | INTRAVENOUS | Status: DC | PRN
  Administered 2022-12-29: 60 mg via INTRAVENOUS

## 2022-12-29 MED ORDER — CHLORHEXIDINE GLUCONATE 0.12 % MT SOLN
OROMUCOSAL | Status: AC
  Administered 2022-12-29: 15 mL via OROMUCOSAL
  Filled 2022-12-29: qty 15

## 2022-12-29 MED ORDER — ACETAMINOPHEN 160 MG/5ML PO SOLN: 1000.0000 mg | Freq: Once | ORAL | Status: DC | PRN

## 2022-12-29 MED ORDER — SUGAMMADEX SODIUM 200 MG/2ML IV SOLN
INTRAVENOUS | Status: DC | PRN
  Administered 2022-12-29: 108.8 mg via INTRAVENOUS

## 2022-12-29 MED ORDER — FENTANYL CITRATE (PF) 100 MCG/2ML IJ SOLN
INTRAMUSCULAR | Status: AC
  Filled 2022-12-29: qty 2

## 2022-12-29 NOTE — Interval H&P Note (Signed)
S: denies worsening shortness of breath, cough, or chest tightness. Symptoms at baseline.  O: Vitals:   12/29/22 0637  BP: (!) 154/86  Resp: 18  Temp: 98.2 F (36.8 C)  SpO2: 99%   General: Well-appearing and in no distress. Well-nourished Eyes: Anicteric, no conjunctival pallor Respiratory: Trachea is midline, no respiratory distress, good bilateral air entry, no wheezes, rales, or rhonchi Cardiovascular: Heart with regular rate and rhythm, normal S1 and S2, no murmurs, rubs, or gallops Gastrointestinal: soft and nontender Neuro: Alert and oriented, no gross focal deficits  A/P: 62 year old female with a history of breast cancer (triple negative) presenting with bilateral pulmonary nodules (RUL and Lingula) with FDG avidity concerning for a metastatic process. Did have a rectal biopsy showing a carcinoma, increasing diagnostic uncertainty. Discussed risks and benefits with the patient. Explained that the Lingular nodule is peripheral and increases the risk of procedure complication (pneumothorax specifically). Explained that my plan is to navigate to the RUL nodule for biopsy, and pending findings intra-procedure, will consider secondary navigation to the lingular nodule and attempted biopsy if possible. Patient understands risks and benefits of the procedure and agrees to proceed.  Raechel Chute, MD  Pulmonary Critical Care 12/29/2022 9:01 AM

## 2022-12-29 NOTE — Transfer of Care (Signed)
Immediate Anesthesia Transfer of Care Note  Patient: Yvonne Scott  Procedure(s) Performed: ROBOTIC ASSISTED NAVIGATIONAL BRONCHOSCOPY (Bilateral) VIDEO BRONCHOSCOPY WITH ENDOBRONCHIAL ULTRASOUND BRONCHIAL NEEDLE ASPIRATION BIOPSIES BRONCHIAL BIOPSIES  Patient Location: PACU  Anesthesia Type:General  Level of Consciousness: awake, alert , and oriented  Airway & Oxygen Therapy: Patient Spontanous Breathing  Post-op Assessment: Report given to RN and Post -op Vital signs reviewed and stable  Post vital signs: Reviewed and stable  Last Vitals:  Vitals Value Taken Time  BP 107/83   Temp    Pulse 88 12/29/22 1003  Resp 16   SpO2 94 % 12/29/22 1003  Vitals shown include unfiled device data.  Last Pain:  Vitals:   12/29/22 0735  TempSrc:   PainSc: 6       Patients Stated Pain Goal: 0 (12/29/22 0735)  Complications: No notable events documented.

## 2022-12-29 NOTE — Anesthesia Postprocedure Evaluation (Signed)
Anesthesia Post Note  Patient: Yvonne Scott  Procedure(s) Performed: ROBOTIC ASSISTED NAVIGATIONAL BRONCHOSCOPY (Bilateral) VIDEO BRONCHOSCOPY WITH ENDOBRONCHIAL ULTRASOUND BRONCHIAL NEEDLE ASPIRATION BIOPSIES BRONCHIAL BIOPSIES     Patient location during evaluation: PACU Anesthesia Type: General Level of consciousness: awake and alert Pain management: pain level controlled Vital Signs Assessment: post-procedure vital signs reviewed and stable Respiratory status: spontaneous breathing, nonlabored ventilation and respiratory function stable Cardiovascular status: blood pressure returned to baseline and stable Postop Assessment: no apparent nausea or vomiting Anesthetic complications: no   No notable events documented.  Last Vitals:  Vitals:   12/29/22 1030 12/29/22 1045  BP: 118/74 125/83  Pulse: 80 78  Resp: 19 (!) 23  Temp:  36.7 C  SpO2: 94% 94%    Last Pain:  Vitals:   12/29/22 1030  TempSrc:   PainSc: 2                  Jency Schnieders

## 2022-12-29 NOTE — Op Note (Signed)
Video Bronchoscopy with Robotic Assisted Bronchoscopic Navigation   Date of Operation: 12/29/2022   Pre-op Diagnosis: pulmonary nodule  Post-op Diagnosis: similar  Surgeon: Raechel Chute, MD  Anesthesia: General endotracheal anesthesia  Operation: Flexible video fiberoptic bronchoscopy with robotic assistance and biopsies.  Estimated Blood Loss: Minimal  Complications: None  Indications and History: Yvonne Scott is a 62 y.o. female with history of breast cancer with pulmonary nodules presenting for biopsy. The risks, benefits, complications, treatment options and expected outcomes were discussed with the patient.  The possibilities of pneumothorax, pneumonia, reaction to medication, pulmonary aspiration, perforation of a viscus, bleeding, failure to diagnose a condition and creating a complication requiring transfusion or operation were discussed with the patient who freely signed the consent.    Description of Procedure: The patient was seen in the Preoperative Area, was examined and was deemed appropriate to proceed.  The patient was taken to [facility] endoscopy room 3, identified as Yvonne Scott and the procedure verified as Flexible Video Fiberoptic Bronchoscopy.  A Time Out was held and the above information confirmed.   Prior to the date of the procedure a high-resolution CT scan of the chest was performed. Utilizing ION software program a virtual tracheobronchial tree was generated to allow the creation of distinct navigation pathways to the patient's parenchymal abnormalities. After being taken to the operating room general anesthesia was initiated and the patient  was orally intubated. The video fiberoptic bronchoscope was introduced via the endotracheal tube and a general inspection was performed which showed normal right and left lung anatomy, aspiration of the bilateral mainstems was completed to remove any remaining secretions. Robotic catheter inserted into patient's  endotracheal tube.   Target #1 RUL nodule: The distinct navigation pathways prepared prior to this procedure were then utilized to navigate to patient's lesion identified on CT scan. The robotic catheter was secured into place and the vision probe was withdrawn.  Lesion location was approximated using fluoroscopy and 3D cone beam CT (CiOS). Under fluoroscopic guidance transbronchial needle biopsies, and transbronchial forceps biopsies were performed to be sent for cytology and pathology.  At this point, the cytotechnologist made a call on the first pass as adequate with presence of malignant cells. Given differential, as well as high risk for pneumothorax with biopsy of the LUL nodule (subpleural and very peripheral location), I decided to not biopsy the LUL nodule to decrease risk of any procedural complications.  I then turned my attention to the EBUS portion of the procedure. The EBUS bronchoscope was introduced into the tracheobronchial tree and airway was examined and cleared of secretions. Lymph node stations 11R inferior, 11R superior, 10R, 7, 4R, and 4L were examined. No enlarged lymph nodes were identified, and no biopsy of the the lymph node stations was performed. The airway was then cleared of secretions and the bronchoscope removed.   Samples Target #1: RUL nodule 1. Transbronchial needle biopsy from RUL nodule 2. Transbronchial forceps biopsies from RUL nodule  Plans:  The patient will be discharged from the PACU to home when recovered from anesthesia and after chest x-ray is reviewed. We will review the cytology, pathology and microbiology results with the patient when they become available. Outpatient followup will be with me and Dr. Cathie Hoops.  Raechel Chute, MD [facility] 12/29/2022 9:57 AM

## 2022-12-29 NOTE — Anesthesia Preprocedure Evaluation (Signed)
Anesthesia Evaluation  Patient identified by MRN, date of birth, ID band Patient awake    Reviewed: Allergy & Precautions, NPO status , Patient's Chart, lab work & pertinent test results  History of Anesthesia Complications Negative for: history of anesthetic complications  Airway Mallampati: III  TM Distance: >3 FB Neck ROM: Full    Dental  (+) Poor Dentition, Chipped, Missing,    Pulmonary shortness of breath and with exertion, Current SmokerPatient did not abstain from smoking.    + decreased breath sounds      Cardiovascular hypertension, (-) angina (-) Past MI and (-) CHF  Rhythm:Regular   1. Left ventricular ejection fraction, by estimation, is 60 to 65%. The  left ventricle has normal function. The left ventricle has no regional  wall motion abnormalities. Left ventricular diastolic parameters are  consistent with Grade I diastolic  dysfunction (impaired relaxation). The average left ventricular global  longitudinal strain is -13.8 %. The global longitudinal strain is  abnormal.   2. Right ventricular systolic function is normal. The right ventricular  size is normal.   3. The mitral valve is grossly normal. No evidence of mitral valve  regurgitation.   4. The aortic valve is tricuspid. Aortic valve regurgitation is not  visualized. No aortic stenosis is present.      Neuro/Psych neg Seizures PSYCHIATRIC DISORDERS Anxiety      Neuromuscular disease    GI/Hepatic Neg liver ROS,GERD  ,,  Endo/Other  diabetes  Lab Results      Component                Value               Date                      HGBA1C                   5.5                 06/17/2022             Renal/GU Lab Results      Component                Value               Date                      NA                       138                 12/29/2022                K                        3.2 (L)             12/29/2022                CO2                       23                  12/29/2022                GLUCOSE  88                  12/29/2022                BUN                      <5 (L)              12/29/2022                CREATININE               0.68                12/29/2022                CALCIUM                  9.1                 12/29/2022                EGFR                     81                  06/17/2022                GFRNONAA                 >60                 12/29/2022                Musculoskeletal negative musculoskeletal ROS (+)    Abdominal   Peds  Hematology negative hematology ROS (+) Lab Results      Component                Value               Date                      WBC                      5.9                 12/29/2022                HGB                      14.1                12/29/2022                HCT                      41.0                12/29/2022                MCV                      94.3                12/29/2022                PLT  200                 12/29/2022              Anesthesia Other Findings   Reproductive/Obstetrics                              Anesthesia Physical Anesthesia Plan  ASA: 3  Anesthesia Plan: General   Post-op Pain Management: Minimal or no pain anticipated   Induction: Intravenous  PONV Risk Score and Plan: 2 and Ondansetron, Propofol infusion, Dexamethasone and TIVA  Airway Management Planned: Oral ETT  Additional Equipment: None  Intra-op Plan:   Post-operative Plan: Extubation in OR  Informed Consent: I have reviewed the patients History and Physical, chart, labs and discussed the procedure including the risks, benefits and alternatives for the proposed anesthesia with the patient or authorized representative who has indicated his/her understanding and acceptance.     Dental advisory given  Plan Discussed with: CRNA  Anesthesia Plan Comments:           Anesthesia Quick Evaluation

## 2022-12-29 NOTE — Anesthesia Procedure Notes (Signed)
Procedure Name: Intubation Date/Time: 12/29/2022 9:21 AM  Performed by: Randon Goldsmith, CRNAPre-anesthesia Checklist: Patient identified, Emergency Drugs available, Suction available and Patient being monitored Patient Re-evaluated:Patient Re-evaluated prior to induction Oxygen Delivery Method: Circle system utilized Preoxygenation: Pre-oxygenation with 100% oxygen Induction Type: IV induction Ventilation: Mask ventilation without difficulty Laryngoscope Size: Mac and 3 Grade View: Grade I Tube type: Oral Tube size: 8.5 mm Number of attempts: 1 Airway Equipment and Method: Stylet and Oral airway Placement Confirmation: ETT inserted through vocal cords under direct vision, positive ETCO2 and breath sounds checked- equal and bilateral Secured at: 20 cm Tube secured with: Tape Dental Injury: Teeth and Oropharynx as per pre-operative assessment

## 2023-01-01 ENCOUNTER — Other Ambulatory Visit: Payer: Self-pay | Admitting: Family

## 2023-01-02 ENCOUNTER — Other Ambulatory Visit: Payer: Self-pay | Admitting: Family

## 2023-01-02 ENCOUNTER — Encounter (HOSPITAL_COMMUNITY): Payer: Self-pay | Admitting: Student in an Organized Health Care Education/Training Program

## 2023-01-03 ENCOUNTER — Other Ambulatory Visit: Payer: Self-pay | Admitting: Oncology

## 2023-01-04 LAB — CYTOLOGY - NON PAP

## 2023-01-05 ENCOUNTER — Ambulatory Visit: Payer: Medicaid Other | Admitting: Oncology

## 2023-01-06 ENCOUNTER — Encounter: Payer: Self-pay | Admitting: Oncology

## 2023-01-10 ENCOUNTER — Other Ambulatory Visit: Payer: Self-pay | Admitting: *Deleted

## 2023-01-10 ENCOUNTER — Ambulatory Visit: Payer: Medicaid Other | Attending: Student in an Organized Health Care Education/Training Program

## 2023-01-10 ENCOUNTER — Encounter: Payer: Self-pay | Admitting: Oncology

## 2023-01-10 ENCOUNTER — Inpatient Hospital Stay: Payer: Medicaid Other | Attending: Oncology | Admitting: Oncology

## 2023-01-10 VITALS — BP 109/82 | HR 113 | Temp 99.5°F | Ht 62.0 in | Wt 122.0 lb

## 2023-01-10 DIAGNOSIS — R0602 Shortness of breath: Secondary | ICD-10-CM | POA: Diagnosis present

## 2023-01-10 DIAGNOSIS — C50911 Malignant neoplasm of unspecified site of right female breast: Secondary | ICD-10-CM | POA: Diagnosis not present

## 2023-01-10 DIAGNOSIS — C20 Malignant neoplasm of rectum: Secondary | ICD-10-CM

## 2023-01-10 DIAGNOSIS — Z171 Estrogen receptor negative status [ER-]: Secondary | ICD-10-CM | POA: Diagnosis not present

## 2023-01-10 DIAGNOSIS — F17210 Nicotine dependence, cigarettes, uncomplicated: Secondary | ICD-10-CM | POA: Diagnosis not present

## 2023-01-10 DIAGNOSIS — K6289 Other specified diseases of anus and rectum: Secondary | ICD-10-CM | POA: Diagnosis not present

## 2023-01-10 DIAGNOSIS — Z95828 Presence of other vascular implants and grafts: Secondary | ICD-10-CM

## 2023-01-10 DIAGNOSIS — C3491 Malignant neoplasm of unspecified part of right bronchus or lung: Secondary | ICD-10-CM

## 2023-01-10 DIAGNOSIS — R911 Solitary pulmonary nodule: Secondary | ICD-10-CM | POA: Diagnosis not present

## 2023-01-10 DIAGNOSIS — Z720 Tobacco use: Secondary | ICD-10-CM | POA: Diagnosis not present

## 2023-01-10 DIAGNOSIS — R0609 Other forms of dyspnea: Secondary | ICD-10-CM | POA: Insufficient documentation

## 2023-01-10 LAB — PULMONARY FUNCTION TEST ARMC ONLY
DL/VA % pred: 72 %
DL/VA: 3.1 ml/min/mmHg/L
DLCO unc % pred: 42 %
DLCO unc: 7.98 ml/min/mmHg
FEF 25-75 Post: 1.13 L/s
FEF 25-75 Pre: 0.77 L/s
FEF2575-%Change-Post: 47 %
FEF2575-%Pred-Post: 52 %
FEF2575-%Pred-Pre: 35 %
FEV1-%Change-Post: 10 %
FEV1-%Pred-Post: 64 %
FEV1-%Pred-Pre: 58 %
FEV1-Post: 1.5 L
FEV1-Pre: 1.36 L
FEV1FVC-%Change-Post: 3 %
FEV1FVC-%Pred-Pre: 86 %
FEV6-%Change-Post: 6 %
FEV6-%Pred-Post: 74 %
FEV6-%Pred-Pre: 70 %
FEV6-Post: 2.16 L
FEV6-Pre: 2.03 L
FEV6FVC-%Pred-Post: 103 %
FEV6FVC-%Pred-Pre: 103 %
FVC-%Change-Post: 6 %
FVC-%Pred-Post: 71 %
FVC-%Pred-Pre: 67 %
FVC-Post: 2.16 L
FVC-Pre: 2.03 L
Post FEV1/FVC ratio: 69 %
Post FEV6/FVC ratio: 100 %
Pre FEV1/FVC ratio: 67 %
Pre FEV6/FVC Ratio: 100 %
RV % pred: 131 %
RV: 2.54 L
TLC % pred: 81 %
TLC: 3.87 L

## 2023-01-10 MED ORDER — AMOXICILLIN-POT CLAVULANATE 875-125 MG PO TABS: 1.0000 | ORAL_TABLET | Freq: Two times a day (BID) | ORAL | 0 refills | Status: DC

## 2023-01-10 MED ORDER — ALBUTEROL SULFATE (2.5 MG/3ML) 0.083% IN NEBU
2.5000 mg | INHALATION_SOLUTION | Freq: Once | RESPIRATORY_TRACT | Status: AC
  Administered 2023-01-10: 2.5 mg via RESPIRATORY_TRACT
  Filled 2023-01-10: qty 3

## 2023-01-10 MED ORDER — PREDNISONE 10 MG (21) PO TBPK: ORAL_TABLET | ORAL | 0 refills | Status: DC

## 2023-01-10 NOTE — Progress Notes (Signed)
Notified by thoracic navigator at [facility] that pt had positive biopsy. Pt established with Dr. Cathie Hoops and added for tumor board review/discussion.

## 2023-01-10 NOTE — Progress Notes (Signed)
Hematology/Oncology Progress note [phone] [phone]       CHIEF COMPLAINTS/REASON FOR VISIT:  Follow-up for triple negative right breast cancer, squamous cell lung cancer, rectal adenocarcinoma   ASSESSMENT & PLAN:   Cancer Staging  Invasive ductal carcinoma of right breast (HCC) Staging form: Breast, AJCC 8th Edition - Pathologic stage from 10/19/2021: No Stage Recommended (ypT2, pN0, cM0, G3, ER-, PR-, HER2-) - Signed by Rickard Patience, MD on 11/03/2021  Rectal adenocarcinoma [facility]) Staging form: Colon and Rectum, AJCC 8th Edition - Clinical: Stage I (cT1, cN0, cM0) - Signed by Rickard Patience, MD on 01/10/2023  Squamous cell lung cancer, right [facility]) Staging form: Lung, AJCC 8th Edition - Clinical: cT1, cN0, cM0 - Signed by Rickard Patience, MD on 01/10/2023   Invasive ductal carcinoma of right breast (HCC) Clinical stage III triple negative right breast cancer. cT3 cN0-1 S/p  neoadjuvant carboplatin Taxol Keytruda followed by [facility] with Keytruda x 3.   4th cycle of AC with Rande Lawman was not given due to poor tolerance. Status post right mastectomy with sentinel lymph node biopsy. ypT2 ypN0, residual disease, Patient declines any adjuvant chemotherapy or immunotherapy She has multiple FDG lung nodules.  Lingular nodule was not able to be biopsied.  Continue clinical surveillance Refer patient to genetic counseling  Tobacco abuse Smoke cessation was discussed with patient.   Port-A-Cath in place She will get [name] flush in 4 weeks. Depending on her work up results, we will schedule additional port flush if medi port is not going to be used for treatment.   Squamous cell lung cancer, right (HCC) Bronchoscopy biopsy of right upper lobe nodule showed squamous cell carcinoma. Other lung nodule was not able to be biopsied due to location and extract for pneumothorax. Pathology results were reviewed and discussed with patient. Patient has COPD/emphysema, more than 1  suspicious lung nodule, likely not a surgical candidate In addition she also has another competing primary malignancy. Recommend patient to follow-up with Dr. Rushie Chestnut for evaluation of SBRT.  Rectal adenocarcinoma [facility]) Colonoscopy results were reviewed with patient. We recommend MRI pelvis with and without contrast for further staging. Depending on the staging result, she will need upfront surgical resection versus neoadjuvant chemotherapy plus concurrent chemoradiation followed by surgery.  Will refer patient to establish care with colorectal surgery.   Orders Placed This Encounter  Procedures   MR Pelvis Wo Contrast    Standing Status:   Future    Standing Expiration Date:   01/10/2024    Order Specific Question:   What is the patient's sedation requirement?    Answer:   No Sedation    Order Specific Question:   Does the patient have a pacemaker or implanted devices?    Answer:   No    Order Specific Question:   Preferred imaging location?    Answer:   [facility] (table limit - 550lbs)   Ambulatory referral to Radiation Oncology    Referral Priority:   Routine    Referral Type:   Consultation    Referral Reason:   Specialty Services Required    Requested Specialty:   Radiation Oncology    Number of Visits Requested:   1   Ambulatory referral to Genetics    Referral Priority:   Routine    Referral Type:   Consultation    Referral Reason:   Specialty Services Required    Number of Visits Requested:   1   Follow-up in TBD  All questions were answered. The  patient knows to call the clinic with any problems, questions or concerns.  Rickard Patience, MD, PhD [facility] Hematology Oncology 01/10/2023        HISTORY OF PRESENTING ILLNESS:   Yvonne Scott is a  62 y.o.  female presents for follow-up of triple negative breast cancer. Oncology History  Invasive ductal carcinoma of right breast (HCC)  11/11/2020 Genetic Testing   Genetic testing done at [facility].  Per note,  negative.   02/20/2021 Initial Diagnosis   Invasive ductal carcinoma of right breast (HCC)\  -August 2022, patient was diagnosed with right breast stage IIIb cT3 N0M0, grade 2, ER negative, PR negative HER2 negative breast cancer.  Patient self palpated breast mass many years ago.  10/21/2020 diagnostic mammogram and ultrasound confirmed presence of mass and a subsequent biopsy revealed right breast triple negative cancer. There was plan for patient to start with neoadjuvant chemotherapy followed by surgery and radiation.  Unfortunately patient never started treatment.    Medi port was placed at [facility].  Established care with me on 02/19/2021    02/24/2021 Imaging   CT chest abdomen pelvis showed large right breast mass, 5.5 x 4.8 cm with small right axillary lymph node with variable degrees of enhancement potentially involved but not specific based on size.  This tracked down to retropectoral nodal stations largest lymph node along the lateral margin of the pectoralis major.  Tiny pulmonary nodules in the chest as discussed.  These are nonspecific but would consider short interval follow-up to assess for any changes.  No evidence of metastatic disease involving the abdomen or pelvis.  Aortic atherosclerosis.   02/25/2021 Echocardiogram   echocardiogram showed LVEF 65 to 70%.  Patient has been to chemotherapy class and understands antiemetics instructions   03/11/2021 - 08/26/2021 Chemotherapy   BREAST Pembrolizumab (200) D1 + Carboplatin (5) D1 + Paclitaxel (80) D1,8,15 q21d X 4 cycles, followed by Pembrolizumab (200) D1 + AC D1 q21d x 3 cycles. 4th cycle was omitted due to poor tolerance.     03/16/2021 Imaging   bone scan showed no evidence of osseous metastatic breast cancer, asymmetric osseous uptake on the right at L5-S1, corresponding to degenerative disc disease on recent CT.  Asymmetric soft tissue uptake in the right breast   07/13/2021 Echocardiogram   Echocardiogram showed LVEF 60-65%.   Grade 1 diastolic dysfunction.  Refer details to echocardiogram reports.   10/19/2021 Surgery   Patient underwent right modified radical mastectomy  Pathology showed invasive mammary carcinoma, no special type, multifocal, with focal chondroid stroma.  Clip and biopsy sites identified.  12 lymph nodes negative for malignancy.  2 lymph nodes displaying dense fibrous scarring, compatible with pathological complete response.  Benign nipple/areola.   Grade 3, LVI not identified, DCIS not identified.  All margins negative for invasive carcinoma.   ypT2 ypN0  Comment Due to the presence of dense fibrosis and treatment effect in two of the sampled lymph nodes, pancytokeratin stains were performed on all submitted lymph nodes.  All lymph nodes are negative for residual metastatic carcinoma.  A separate focus of invasive carcinoma, measuring approximately 11 mm in greatest extent, and histologically similar to the primary tumor, is identified within sampling of the fibrous tissue adjacent to the primary tumor.    10/19/2021 Cancer Staging   Staging form: Breast, AJCC 8th Edition - Pathologic stage from 10/19/2021: No Stage Recommended (ypT2, pN0, cM0, G3, ER-, PR-, HER2-) - Signed by Rickard Patience, MD on 11/03/2021 Stage prefix: Post-therapy Response to  neoadjuvant therapy: Partial response Multigene prognostic tests performed: None Histologic grading system: 3 grade system   05/27/2022 Imaging   CT chest abdomen pelvis with contrast showed 1. Interval right mastectomy with resolution of suspicious right axillary nodes. 2. Enlargement of a 5 mm right lower lobe pulmonary nodule. Development of a 5 mm left upper lobe pulmonary nodule. Cannot exclude early metastatic disease. Consider chest CT follow-up at 3 months. 3. Otherwise, no evidence of metastatic disease in the chest, abdomen, or pelvis. 4. Age advanced coronary artery atherosclerosis. Recommend assessment of coronary risk factors. 5. Aortic  atherosclerosis (ICD10-I70.0) and emphysema (ICD10-J43.9). 6. Mild enlargement of the common duct, which is now minimally dilated for age. Correlate with bilirubin levels. If elevated,consider ERCP.   09/09/2022 Imaging   CT chest without contrast showed 1. Interval growth of three solid bilateral pulmonary nodules since 05/25/2022 chest CT, largest 0.9 cm in the anteromedial left upper lobe, highly suspicious for pulmonary metastases. 2. No thoracic adenopathy. 3. Three-vessel coronary atherosclerosis. 4. Aortic Atherosclerosis (ICD10-I70.0) and Emphysema (ICD10-J43.9).     09/29/2022 Imaging   PET scan shows 1. Hypermetabolic bilateral pulmonary nodules, consistent with metastasis. 2. No other evidence of tracer avid metastatic disease, status post right mastectomy. 3. Hypermetabolism with suggestion of soft tissue fullness in the mid rectum, suspicious for polyp or carcinoma. Consider further evaluation with sigmoidoscopy. 4. Incidental findings, including: Aortic atherosclerosis (ICD10-I70.0), coronary artery atherosclerosis and emphysema (ICD10-J43.9).   Squamous cell lung cancer, right (HCC)  08/12/2022 Initial Diagnosis   Squamous cell lung cancer, right (HCC)   01/10/2023 Cancer Staging   Staging form: Lung, AJCC 8th Edition - Clinical: cT1, cN0, cM0 - Signed by Rickard Patience, MD on 01/10/2023 Stage prefix: Initial diagnosis   Rectal adenocarcinoma (HCC)  10/13/2022 Initial Diagnosis   Rectal adenocarcinoma (HCC)   01/10/2023 Cancer Staging   Staging form: Colon and Rectum, AJCC 8th Edition - Clinical: Stage I (cT1, cN0, cM0) - Signed by Rickard Patience, MD on 01/10/2023 Stage prefix: Initial diagnosis Total positive nodes: 0    07/16/2021-07/19/2021, patient was admitted for HCAP patient was treated with antibiotics 09/04/2021 - 09/08/2021 Patient was admitted due to pancytopenia, thrush electrolyte abnormality.     INTERVAL HISTORY AAJAH UHDE is a 62 y.o. female who has above  history reviewed by me today presents for follow up visit for management of triple negative breast cancer Accompanied by mother Status post colonoscopy, patient has large polyp and biopsy showed rectal adenocarcinoma-at least intramucosal. She was also recommended to have bronchoscopy ASAP for further evaluation of the lung nodules.  Patient adamantly declined pulmonology workup concurrently with colonoscopy.  She eventually underwent pulmonology workup with bronchoscopy biopsy of right upper lobe nodule and the results showed squamous cell carcinoma.   lingula nodule was not biopsied due to location and threat for pneumothorax.  Denies hematochezia,  black tarry stool or abdominal pain, change of bowel habits.   Patient continues to smoke daily.  She was accompanied by mother today.  Review results. She reports worsening of cough after bronchoscopy.+ Productive cough with yellowish sputum, occasional blood-tinged No fever or chills. + Arm and lower back pain + She feels anxious about recent workup results.   Review of Systems  Constitutional:  Positive for fatigue. Negative for appetite change, chills and fever.  HENT:   Negative for hearing loss, mouth sores and voice change.   Eyes:  Negative for eye problems.  Respiratory:  Negative for chest tightness and cough.   Cardiovascular:  Negative for chest pain.  Gastrointestinal:  Negative for abdominal distention, abdominal pain, blood in stool, diarrhea and nausea.  Endocrine: Negative for hot flashes.  Genitourinary:  Negative for difficulty urinating, dysuria and frequency.   Musculoskeletal:  Positive for arthralgias and back pain.  Skin:  Negative for itching and rash.  Neurological:  Positive for numbness. Negative for extremity weakness.  Hematological:  Negative for adenopathy.  Psychiatric/Behavioral:  Negative for confusion. The patient is nervous/anxious.     MEDICAL HISTORY:  Past Medical History:  Diagnosis Date   Acute  kidney injury (HCC)    Anxiety    Aortic atherosclerosis (HCC) 10/24/2016   Breast cancer (HCC)    Chemotherapy-induced neuropathy (HCC)    Essential hypertension    GERD (gastroesophageal reflux disease)    Hypokalemia    Hypomagnesemia    Invasive ductal carcinoma of right breast (HCC)    Metabolic acidosis    Personal history of chemotherapy    Pneumonia    Severe protein-calorie malnutrition (HCC)    Thrombocytopenia (HCC)     SURGICAL HISTORY: Past Surgical History:  Procedure Laterality Date   BREAST BIOPSY Right 2022   BRONCHIAL BIOPSY  12/29/2022   Procedure: BRONCHIAL BIOPSIES;  Surgeon: Raechel Chute, MD;  Location: [facility] ENDOSCOPY;  Service: Pulmonary;;   BRONCHIAL NEEDLE ASPIRATION BIOPSY  12/29/2022   Procedure: BRONCHIAL NEEDLE ASPIRATION BIOPSIES;  Surgeon: Raechel Chute, MD;  Location: [facility] ENDOSCOPY;  Service: Pulmonary;;   COLONOSCOPY WITH PROPOFOL N/A 12/07/2022   Procedure: COLONOSCOPY WITH PROPOFOL;  Surgeon: Toney Reil, MD;  Location: [facility] ENDOSCOPY;  Service: Gastroenterology;  Laterality: N/A;   FOOT SURGERY Left    little toe correction   MASTECTOMY W/ SENTINEL NODE BIOPSY Right 10/19/2021   Procedure: MASTECTOMY WITH SENTINEL LYMPH NODE BIOPSY ( modified radical);  Surgeon: Carolan Shiver, MD;  Location: [facility] ORS;  Service: General;  Laterality: Right;   POLYPECTOMY  12/07/2022   Procedure: POLYPECTOMY;  Surgeon: Toney Reil, MD;  Location: [facility] ENDOSCOPY;  Service: Gastroenterology;;   PORTA CATH INSERTION  01/19/2021   SUBMUCOSAL TATTOO INJECTION  12/07/2022   Procedure: SUBMUCOSAL TATTOO INJECTION;  Surgeon: Toney Reil, MD;  Location: [facility] ENDOSCOPY;  Service: Gastroenterology;;   VIDEO BRONCHOSCOPY WITH ENDOBRONCHIAL ULTRASOUND N/A 12/29/2022   Procedure: VIDEO BRONCHOSCOPY WITH ENDOBRONCHIAL ULTRASOUND;  Surgeon: Raechel Chute, MD;  Location: [facility] ENDOSCOPY;  Service: Pulmonary;  Laterality: N/A;    SOCIAL HISTORY: Social  History   Socioeconomic History   Marital status: Widowed    Spouse name: Not on file   Number of children: 1   Years of education: Not on file   Highest education level: Not on file  Occupational History   Not on file  Tobacco Use   Smoking status: Every Day    Current packs/day: 1.00    Average packs/day: 1 pack/day for 43.8 years (43.8 ttl pk-yrs)    Types: Cigarettes    Start date: 03/15/1979   Smokeless tobacco: Never  Vaping Use   Vaping status: Never Used  Substance and Sexual Activity   Alcohol use: Not Currently    Alcohol/week: 3.0 standard drinks of alcohol    Types: 3 Standard drinks or equivalent per week   Drug use: Not Currently   Sexual activity: Not Currently    Birth control/protection: None  Other Topics Concern   Not on file  Social History Narrative   Lives with mother   Social Determinants of Health   Financial Resource Strain: Low Risk  (  01/06/2022)   Overall Financial Resource Strain (CARDIA)    Difficulty of Paying Living Expenses: Not very hard  Food Insecurity: No Food Insecurity (01/06/2022)   Hunger Vital Sign    Worried About Running Out of Food in the Last Year: Never true    Ran Out of Food in the Last Year: Never true  Transportation Needs: No Transportation Needs (01/06/2022)   PRAPARE - Administrator, Civil Service (Medical): No    Lack of Transportation (Non-Medical): No  Physical Activity: Inactive (01/06/2022)   Exercise Vital Sign    Days of Exercise per Week: 0 days    Minutes of Exercise per Session: 0 min  Stress: Stress Concern Present (01/06/2022)   [employer] of Occupational Health - Occupational Stress Questionnaire    Feeling of Stress : Rather much  Social Connections: Moderately Isolated (01/06/2022)   Social Connection and Isolation Panel [NHANES]    Frequency of Communication with Friends and Family: Once a week    Frequency of Social Gatherings with Friends and Family: Once a week    Attends  Religious Services: 1 to 4 times per year    Active Member of [employer] or Organizations: Yes    Attends Banker Meetings: Never    Marital Status: Widowed  Intimate Partner Violence: Not At Risk (01/06/2022)   Humiliation, Afraid, Rape, and Kick questionnaire    Fear of Current or Ex-Partner: No    Emotionally Abused: No    Physically Abused: No    Sexually Abused: No    FAMILY HISTORY: Family History  Problem Relation Age of Onset   Cancer Mother 53       breast   Cancer Father        melonma   Cirrhosis Maternal Grandfather    Cancer Paternal Grandmother        skin/breast    ALLERGIES:  has No Known Allergies.  MEDICATIONS:  Current Outpatient Medications  Medication Sig Dispense Refill   amoxicillin-clavulanate (AUGMENTIN) 875-125 MG tablet Take 1 tablet by mouth 2 (two) times daily. 10 tablet 0   fluticasone (FLONASE) 50 MCG/ACT nasal spray Place 1 spray into both nostrils daily.     guaiFENesin (MUCINEX) 600 MG 12 hr tablet Take 600 mg by mouth 2 (two) times daily.     ibuprofen (ADVIL) 200 MG tablet Take 200-400 mg by mouth every 6 (six) hours as needed for headache.     lidocaine-prilocaine (EMLA) cream APPLY TO AFFECTED AREA ONCE 30 g 0   magnesium chloride (SLOW-MAG) 64 MG TBEC SR tablet Take 1 tablet (64 mg total) by mouth 2 (two) times daily. 60 tablet 2   naloxone (NARCAN) nasal spray 4 mg/0.1 mL      omeprazole (PRILOSEC) 40 MG capsule Take 1 capsule (40 mg total) by mouth daily. 90 capsule 0   potassium chloride (MICRO-K) 10 MEQ CR capsule TAKE 1 CAPSULE BY MOUTH ONCE DAILY WITH FUROSEMIDE TO AVOID LOW POTASSIUM OR CRAMPS 30 capsule 0   predniSONE (STERAPRED UNI-PAK 21 TAB) 10 MG (21) TBPK tablet Day 1 take 6 tablets, Day 2 take 5 tablets, Day 3 take 4 tablets, Day 4 take 3 tablets, Day 5 take 2 tablets D6 take 1 tablet 1 each 0   VENTOLIN HFA 108 (90 Base) MCG/ACT inhaler INHALE 1 PUFF BY MOUTH EVERY 6 HOURS AS NEEDED FOR SHORTNESS OF BREATH FOR  WHEEZING 18 g 0   Vitamin D, Ergocalciferol, (DRISDOL) 1.25 MG (50000 UNIT) CAPS capsule  Take 50,000 Units by mouth once a week.     Calcium Carb-Cholecalciferol (OYSTER SHELL CALCIUM W/D) 500-5 MG-MCG TABS Take by mouth. (Patient not taking: Reported on 12/27/2022)     calcium carbonate (TUMS EX) 750 MG chewable tablet Chew 2 tablets by mouth 4 (four) times daily as needed for heartburn. (Patient not taking: Reported on 12/27/2022)     Fluticasone-Umeclidin-Vilant (TRELEGY ELLIPTA) 100-62.5-25 MCG/ACT AEPB Inhale 1 Inhalation into the lungs daily. (Patient not taking: Reported on 12/14/2022) 60 each 1   No current facility-administered medications for this visit.   Facility-Administered Medications Ordered in Other Visits  Medication Dose Route Frequency Provider Last Rate Last Admin   famotidine (PEPCID) 20-0.9 MG/50ML-% IVPB              PHYSICAL EXAMINATION: ECOG PERFORMANCE STATUS: 1 - Symptomatic but completely ambulatory Vitals:   01/10/23 1344  BP: 109/82  Pulse: (!) 113  Temp: 99.5 F (37.5 C)  SpO2: 99%   Filed Weights   01/10/23 1344  Weight: 121 lb 15.7 oz (55.3 kg)    Physical Exam Constitutional:      General: She is not in acute distress. HENT:     Head: Normocephalic and atraumatic.  Eyes:     General: No scleral icterus. Cardiovascular:     Rate and Rhythm: Normal rate.  Pulmonary:     Effort: Pulmonary effort is normal. No respiratory distress.  Abdominal:     General: Bowel sounds are normal. There is no distension.     Palpations: Abdomen is soft.  Musculoskeletal:        General: No deformity. Normal range of motion.     Cervical back: Normal range of motion and neck supple.  Skin:    Findings: No erythema or rash.  Neurological:     Mental Status: She is alert and oriented to person, place, and time. Mental status is at baseline.  Psychiatric:     Comments: Anxious      LABORATORY DATA:  I have reviewed the data as listed    Latest Ref  Rng & Units 12/29/2022    7:35 AM 10/13/2022    3:01 PM 08/09/2022   10:05 AM  CBC  WBC 4.0 - 10.5 K/uL 5.9  9.5  7.7   Hemoglobin 12.0 - 15.0 g/dL 78.2  95.6  21.3   Hematocrit 36.0 - 46.0 % 41.0  37.9  38.9   Platelets 150 - 400 K/uL 200  225  237       Latest Ref Rng & Units 12/29/2022    7:35 AM 10/13/2022    3:02 PM 08/09/2022   10:06 AM  CMP  Glucose 70 - 99 mg/dL 88  086  578   BUN 8 - 23 mg/dL <5  <5  9   Creatinine 0.44 - 1.00 mg/dL 4.69  6.29  5.28   Sodium 135 - 145 mmol/L 138  129  137   Potassium 3.5 - 5.1 mmol/L 3.2  3.4  3.6   Chloride 98 - 111 mmol/L 101  92  103   CO2 22 - 32 mmol/L 23  25  24    Calcium 8.9 - 10.3 mg/dL 9.1  9.2  9.3   Total Protein 6.5 - 8.1 g/dL  6.9  7.1   Total Bilirubin 0.3 - 1.2 mg/dL  0.8  0.5   Alkaline Phos 38 - 126 U/L  77  69   AST 15 - 41 U/L  24  18  ALT 0 - 44 U/L  15  12         RADIOGRAPHIC STUDIES: I have personally reviewed the radiological images as listed and agreed with the findings in the report. CT SUPER D CHEST WO CONTRAST  Result Date: 01/09/2023 CLINICAL DATA:  Enlarging hypermetabolic pulmonary nodules consistent with metastatic disease. History of breast cancer. * Tracking Code: BO * EXAM: CT CHEST WITHOUT CONTRAST TECHNIQUE: Multidetector CT imaging of the chest was performed using thin slice collimation for electromagnetic bronchoscopy planning purposes, without intravenous contrast. RADIATION DOSE REDUCTION: This exam was performed according to the departmental dose-optimization program which includes automated exposure control, adjustment of the mA and/or kV according to patient size and/or use of iterative reconstruction technique. COMPARISON:  PET-CT 09/26/2022.  Chest CT 09/09/2022 and 05/25/2022. FINDINGS: Cardiovascular: Atherosclerosis of the aorta, great vessels and coronary arteries. Left IJ Port-A-Cath extends to the superior cavoatrial junction. The heart size is normal. There is no pericardial effusion.  Mediastinum/Nodes: Status post right mastectomy and axillary node dissection. There are no enlarged mediastinal, hilar, internal mammary or axillary lymph nodes. The thyroid gland, trachea and esophagus demonstrate no significant findings. Lungs/Pleura: No pleural effusion or pneumothorax. Moderate centrilobular and paraseptal emphysema. Interval slight enlargement of solid nodule in the right upper lobe measuring 9 mm on image 59/4 (previously 6 mm). 8 mm right lower lobe nodule on image 124/4 previously measured 7 mm. Medial left upper lobe nodule measures approximately 9 mm on image 45/4 (previously 7 mm when measured in a similar fashion). No new suspicious nodules. There is stable scarring in the lingula and right middle lobe. Upper abdomen: The visualized upper abdomen appears stable, without suspicious findings. Musculoskeletal/Chest wall: Status post right mastectomy. No evidence of chest wall recurrence or osseous metastatic disease. No acute osseous findings. Mild spondylosis. IMPRESSION: 1. Imaging for bronchoscopy planning and guidance. 2. Interval slight enlargement of bilateral pulmonary nodules consistent with progressive metastatic disease. No new suspicious nodules. 3. No evidence of chest wall recurrence, nodal or osseous metastatic disease. 4. Status post right mastectomy and axillary node dissection. 5. Aortic Atherosclerosis (ICD10-I70.0) and Emphysema (ICD10-J43.9). Electronically Signed   By: Carey Bullocks M.D.   On: 01/09/2023 14:39   DG Chest Port 1 View  Result Date: 12/29/2022 CLINICAL DATA:  Status post bronchoscopy. EXAM: PORTABLE CHEST 1 VIEW COMPARISON:  December 28, 2022. FINDINGS: The heart size and mediastinal contours are within normal limits. Left internal jugular Port-A-Cath is unchanged in position. Nodular density is noted in right upper lobe concerning for possible malignancy or metastatic disease. No pneumothorax or pleural effusion is noted. The visualized skeletal  structures are unremarkable. IMPRESSION: No pneumothorax or pleural effusion status post bronchoscopy. Right upper lobe nodule is noted concerning for malignancy or metastatic disease. Electronically Signed   By: Lupita Raider M.D.   On: 12/29/2022 12:32   DG C-ARM BRONCHOSCOPY  Result Date: 12/29/2022 C-ARM BRONCHOSCOPY: Fluoroscopy was utilized by the requesting physician.  No radiographic interpretation.

## 2023-01-10 NOTE — Progress Notes (Signed)
C/o arm and back pain 8/10.  Generally not feeling well, cough, sob, fever. Concerned she may be dehydrated.  Had bronchoscopy 12/29/22, PFT today.  Asking for something for pain.

## 2023-01-10 NOTE — Assessment & Plan Note (Signed)
She will get [name] flush in 4 weeks. Depending on her work up results, we will schedule additional port flush if medi port is not going to be used for treatment.

## 2023-01-10 NOTE — Assessment & Plan Note (Signed)
Colonoscopy results were reviewed with patient. We recommend MRI pelvis with and without contrast for further staging. Depending on the staging result, she will need upfront surgical resection versus neoadjuvant chemotherapy plus concurrent chemoradiation followed by surgery.  Will refer patient to establish care with colorectal surgery.

## 2023-01-10 NOTE — Assessment & Plan Note (Signed)
Bronchoscopy biopsy of right upper lobe nodule showed squamous cell carcinoma. Other lung nodule was not able to be biopsied due to location and extract for pneumothorax. Pathology results were reviewed and discussed with patient. Patient has COPD/emphysema, more than 1 suspicious lung nodule, likely not a surgical candidate In addition she also has another competing primary malignancy. Recommend patient to follow-up with Dr. Rushie Chestnut for evaluation of SBRT.

## 2023-01-10 NOTE — Assessment & Plan Note (Signed)
Smoke cessation was discussed with patient.

## 2023-01-10 NOTE — Assessment & Plan Note (Addendum)
Clinical stage III triple negative right breast cancer. cT3 cN0-1 S/p  neoadjuvant carboplatin Taxol Keytruda followed by [facility] with Keytruda x 3.   4th cycle of AC with Rande Lawman was not given due to poor tolerance. Status post right mastectomy with sentinel lymph node biopsy. ypT2 ypN0, residual disease, Patient declines any adjuvant chemotherapy or immunotherapy She has multiple FDG lung nodules.  Lingular nodule was not able to be biopsied.  Continue clinical surveillance Refer patient to genetic counseling

## 2023-01-11 ENCOUNTER — Telehealth: Payer: Self-pay | Admitting: Student in an Organized Health Care Education/Training Program

## 2023-01-11 DIAGNOSIS — J449 Chronic obstructive pulmonary disease, unspecified: Secondary | ICD-10-CM

## 2023-01-11 NOTE — Telephone Encounter (Signed)
Lm x1 for the patient, to let her know Dr. Aundria Rud is working in the ICU and it maybe tomorrow before she gets a response back.

## 2023-01-11 NOTE — Telephone Encounter (Signed)
Patient had PFT yesterday.

## 2023-01-12 ENCOUNTER — Ambulatory Visit: Payer: Medicaid Other | Admitting: Oncology

## 2023-01-12 ENCOUNTER — Encounter: Payer: Self-pay | Admitting: Licensed Clinical Social Worker

## 2023-01-12 ENCOUNTER — Other Ambulatory Visit: Payer: Medicaid Other

## 2023-01-12 ENCOUNTER — Inpatient Hospital Stay (HOSPITAL_BASED_OUTPATIENT_CLINIC_OR_DEPARTMENT_OTHER): Payer: Medicaid Other | Admitting: Licensed Clinical Social Worker

## 2023-01-12 DIAGNOSIS — C50911 Malignant neoplasm of unspecified site of right female breast: Secondary | ICD-10-CM

## 2023-01-12 DIAGNOSIS — Z803 Family history of malignant neoplasm of breast: Secondary | ICD-10-CM

## 2023-01-12 DIAGNOSIS — Z808 Family history of malignant neoplasm of other organs or systems: Secondary | ICD-10-CM

## 2023-01-12 DIAGNOSIS — C3491 Malignant neoplasm of unspecified part of right bronchus or lung: Secondary | ICD-10-CM

## 2023-01-12 DIAGNOSIS — C20 Malignant neoplasm of rectum: Secondary | ICD-10-CM

## 2023-01-12 MED ORDER — BREZTRI AEROSPHERE 160-9-4.8 MCG/ACT IN AERO
2.0000 | INHALATION_SPRAY | Freq: Two times a day (BID) | RESPIRATORY_TRACT | 12 refills | Status: DC
Start: 2023-01-12 — End: 2023-01-20

## 2023-01-12 NOTE — Telephone Encounter (Signed)
Patient is aware of below results/recommendations and voiced her understanding.  Nothing further needed.

## 2023-01-12 NOTE — Progress Notes (Signed)
REFERRING PROVIDER: Rickard Patience, MD [address] [place],  [place] [address]  PRIMARY PROVIDER:  Marisue Ivan, NP  PRIMARY REASON FOR VISIT:  1. Invasive ductal carcinoma of right breast (HCC)   2. Squamous cell lung cancer, right (HCC)   3. Rectal adenocarcinoma (HCC)   4. Family history of breast cancer   5. Family history of melanoma      HISTORY OF PRESENT ILLNESS:   Ms. Orris, a 62 y.o. female, was seen for a [facility] cancer genetics consultation at the request of Dr. Cathie Hoops due to a personal and family history of cancer.  Ms. Chestang presents to clinic today to discuss the possibility of a hereditary predisposition to cancer, genetic testing, and to further clarify her future cancer risks, as well as potential cancer risks for family members.   CANCER HISTORY:  Oncology History  Invasive ductal carcinoma of right breast (HCC)  11/11/2020 Genetic Testing   Genetic testing done at [facility].  Per note, negative.   02/20/2021 Initial Diagnosis   Invasive ductal carcinoma of right breast (HCC)\  -August 2022, patient was diagnosed with right breast stage IIIb cT3 N0M0, grade 2, ER negative, PR negative HER2 negative breast cancer.  Patient self palpated breast mass many years ago.  10/21/2020 diagnostic mammogram and ultrasound confirmed presence of mass and a subsequent biopsy revealed right breast triple negative cancer. There was plan for patient to start with neoadjuvant chemotherapy followed by surgery and radiation.  Unfortunately patient never started treatment.    Medi port was placed at [facility].  Established care with me on 02/19/2021    02/24/2021 Imaging   CT chest abdomen pelvis showed large right breast mass, 5.5 x 4.8 cm with small right axillary lymph node with variable degrees of enhancement potentially involved but not specific based on size.  This tracked down to retropectoral nodal stations largest lymph node along the lateral margin of the pectoralis major.  Tiny  pulmonary nodules in the chest as discussed.  These are nonspecific but would consider short interval follow-up to assess for any changes.  No evidence of metastatic disease involving the abdomen or pelvis.  Aortic atherosclerosis.   02/25/2021 Echocardiogram   echocardiogram showed LVEF 65 to 70%.  Patient has been to chemotherapy class and understands antiemetics instructions   03/11/2021 - 08/26/2021 Chemotherapy   BREAST Pembrolizumab (200) D1 + Carboplatin (5) D1 + Paclitaxel (80) D1,8,15 q21d X 4 cycles, followed by Pembrolizumab (200) D1 + AC D1 q21d x 3 cycles. 4th cycle was omitted due to poor tolerance.     03/16/2021 Imaging   bone scan showed no evidence of osseous metastatic breast cancer, asymmetric osseous uptake on the right at L5-S1, corresponding to degenerative disc disease on recent CT.  Asymmetric soft tissue uptake in the right breast   07/13/2021 Echocardiogram   Echocardiogram showed LVEF 60-65%.  Grade 1 diastolic dysfunction.  Refer details to echocardiogram reports.   10/19/2021 Surgery   Patient underwent right modified radical mastectomy  Pathology showed invasive mammary carcinoma, no special type, multifocal, with focal chondroid stroma.  Clip and biopsy sites identified.  12 lymph nodes negative for malignancy.  2 lymph nodes displaying dense fibrous scarring, compatible with pathological complete response.  Benign nipple/areola.   Grade 3, LVI not identified, DCIS not identified.  All margins negative for invasive carcinoma.   ypT2 ypN0  Comment Due to the presence of dense fibrosis and treatment effect in two of the sampled lymph nodes, pancytokeratin stains were  performed on all submitted lymph nodes.  All lymph nodes are negative for residual metastatic carcinoma.  A separate focus of invasive carcinoma, measuring approximately 11 mm in greatest extent, and histologically similar to the primary tumor, is identified within sampling of the fibrous tissue adjacent to  the primary tumor.    10/19/2021 Cancer Staging   Staging form: Breast, AJCC 8th Edition - Pathologic stage from 10/19/2021: No Stage Recommended (ypT2, pN0, cM0, G3, ER-, PR-, HER2-) - Signed by Rickard Patience, MD on 11/03/2021 Stage prefix: Post-therapy Response to neoadjuvant therapy: Partial response Multigene prognostic tests performed: None Histologic grading system: 3 grade system   05/27/2022 Imaging   CT chest abdomen pelvis with contrast showed 1. Interval right mastectomy with resolution of suspicious right axillary nodes. 2. Enlargement of a 5 mm right lower lobe pulmonary nodule. Development of a 5 mm left upper lobe pulmonary nodule. Cannot exclude early metastatic disease. Consider chest CT follow-up at 3 months. 3. Otherwise, no evidence of metastatic disease in the chest, abdomen, or pelvis. 4. Age advanced coronary artery atherosclerosis. Recommend assessment of coronary risk factors. 5. Aortic atherosclerosis (ICD10-I70.0) and emphysema (ICD10-J43.9). 6. Mild enlargement of the common duct, which is now minimally dilated for age. Correlate with bilirubin levels. If elevated,consider ERCP.   09/09/2022 Imaging   CT chest without contrast showed 1. Interval growth of three solid bilateral pulmonary nodules since 05/25/2022 chest CT, largest 0.9 cm in the anteromedial left upper lobe, highly suspicious for pulmonary metastases. 2. No thoracic adenopathy. 3. Three-vessel coronary atherosclerosis. 4. Aortic Atherosclerosis (ICD10-I70.0) and Emphysema (ICD10-J43.9).     09/29/2022 Imaging   PET scan shows 1. Hypermetabolic bilateral pulmonary nodules, consistent with metastasis. 2. No other evidence of tracer avid metastatic disease, status post right mastectomy. 3. Hypermetabolism with suggestion of soft tissue fullness in the mid rectum, suspicious for polyp or carcinoma. Consider further evaluation with sigmoidoscopy. 4. Incidental findings, including: Aortic  atherosclerosis (ICD10-I70.0), coronary artery atherosclerosis and emphysema (ICD10-J43.9).   Squamous cell lung cancer, right (HCC)  08/12/2022 Initial Diagnosis   Squamous cell lung cancer, right (HCC)   01/10/2023 Cancer Staging   Staging form: Lung, AJCC 8th Edition - Clinical: cT1, cN0, cM0 - Signed by Rickard Patience, MD on 01/10/2023 Stage prefix: Initial diagnosis   Rectal adenocarcinoma (HCC)  10/13/2022 Initial Diagnosis   Rectal adenocarcinoma (HCC)   01/10/2023 Cancer Staging   Staging form: Colon and Rectum, AJCC 8th Edition - Clinical: Stage I (cT1, cN0, cM0) - Signed by Rickard Patience, MD on 01/10/2023 Stage prefix: Initial diagnosis Total positive nodes: 0     In 2022, at the age of 1, Ms. Gerke was diagnosed with invasive ductal carcinoma of the right breast, triple negative.. The treatment plan included neoadjuvant chemotherapy and right mastectomy. She had genetic testing at [facility], per care everywhere this appears to have only been the [facility] Breast Cancer STAT panel (9 genes) this was negative.  In 2024, at the age of 25, Ms. Solomonson was also diagnosed with rectal and SCC lung cancer.   RISK FACTORS:  Menarche was at age 62.  First live birth at age 63.  Ovaries intact: yes.  Hysterectomy: no.  Menopausal status: postmenopausal.   Past Medical History:  Diagnosis Date   Acute kidney injury (HCC)    Anxiety    Aortic atherosclerosis (HCC) 10/24/2016   Breast cancer (HCC)    Chemotherapy-induced neuropathy (HCC)    Essential hypertension    GERD (gastroesophageal reflux disease)    Hypokalemia  Hypomagnesemia    Invasive ductal carcinoma of right breast (HCC)    Metabolic acidosis    Personal history of chemotherapy    Pneumonia    Severe protein-calorie malnutrition (HCC)    Thrombocytopenia (HCC)     Past Surgical History:  Procedure Laterality Date   BREAST BIOPSY Right 2022   BRONCHIAL BIOPSY  12/29/2022   Procedure: BRONCHIAL BIOPSIES;  Surgeon:  Raechel Chute, MD;  Location: [facility] ENDOSCOPY;  Service: Pulmonary;;   BRONCHIAL NEEDLE ASPIRATION BIOPSY  12/29/2022   Procedure: BRONCHIAL NEEDLE ASPIRATION BIOPSIES;  Surgeon: Raechel Chute, MD;  Location: [facility] ENDOSCOPY;  Service: Pulmonary;;   COLONOSCOPY WITH PROPOFOL N/A 12/07/2022   Procedure: COLONOSCOPY WITH PROPOFOL;  Surgeon: Toney Reil, MD;  Location: [facility] ENDOSCOPY;  Service: Gastroenterology;  Laterality: N/A;   FOOT SURGERY Left    little toe correction   MASTECTOMY W/ SENTINEL NODE BIOPSY Right 10/19/2021   Procedure: MASTECTOMY WITH SENTINEL LYMPH NODE BIOPSY ( modified radical);  Surgeon: Carolan Shiver, MD;  Location: [facility] ORS;  Service: General;  Laterality: Right;   POLYPECTOMY  12/07/2022   Procedure: POLYPECTOMY;  Surgeon: Toney Reil, MD;  Location: [facility] ENDOSCOPY;  Service: Gastroenterology;;   PORTA CATH INSERTION  01/19/2021   SUBMUCOSAL TATTOO INJECTION  12/07/2022   Procedure: SUBMUCOSAL TATTOO INJECTION;  Surgeon: Toney Reil, MD;  Location: [facility] ENDOSCOPY;  Service: Gastroenterology;;   VIDEO BRONCHOSCOPY WITH ENDOBRONCHIAL ULTRASOUND N/A 12/29/2022   Procedure: VIDEO BRONCHOSCOPY WITH ENDOBRONCHIAL ULTRASOUND;  Surgeon: Raechel Chute, MD;  Location: [facility] ENDOSCOPY;  Service: Pulmonary;  Laterality: N/A;    FAMILY HISTORY:  We obtained a detailed, 4-generation family history.  Significant diagnoses are listed below: Family History  Problem Relation Age of Onset   Melanoma Father 82   Cirrhosis Maternal Grandfather    Breast cancer Paternal Grandmother    Ms. Wigal has 1 daughter, 68, no cancers. She had 1 full brother, 1 full sister, both recently passed. She has a maternal half sister and paternal half sister, limited information.  Ms. Zar mother is living at 23 and had breasts removed in her 30s, but patient reports this was not cancer. Maternal grandfather may have had cancer, he passed over age 35.  Ms. Levon father  passed of melanoma at 88. Paternal grandmother had breast cancer late in life and passed at 73.   Ms. Marx is unaware of previous family history of genetic testing for hereditary cancer risks. There is no reported Ashkenazi Jewish ancestry. There is no known consanguinity.    GENETIC COUNSELING ASSESSMENT: Ms. Kerrigan is a 62 y.o. female with a personal and family history of cancer which is somewhat suggestive of a hereditary cancer syndrome and predisposition to cancer. We, therefore, discussed and recommended the following at today's visit.   DISCUSSION: We discussed that approximately 10% of breast cancer is hereditary. Most cases of hereditary breast cancer are associated with BRCA1/BRCA2 genes, which she already had tested in 2022, although there are other genes associated with hereditary cancer as well. Cancers and risks are gene specific. We discussed that testing is beneficial for several reasons including knowing about cancer risks, identifying potential screening and risk-reduction options that may be appropriate, and to understand if other family members could be at risk for cancer and allow them to undergo genetic testing.   We reviewed the characteristics, features and inheritance patterns of hereditary cancer syndromes. We also discussed genetic testing, including the appropriate family members to test, the process of testing, insurance coverage  and turn-around-time for results. We discussed the implications of a negative, positive and/or variant of uncertain significant result. We recommended Ms. Laural Benes pursue genetic testing for the [facility] Multi-Cancer+RNA gene panel.   Based on Ms. Muhlenkamp's personal and family history of cancer, she meets medical criteria for genetic testing. Despite that she meets criteria, she may still have an out of pocket cost.   PLAN: After considering the risks, benefits, and limitations, Ms. Lukowski provided informed consent to pursue genetic testing and  the blood sample was sent to [facility] for analysis of the Multi-Cancer+RNA panel. Results should be available within approximately 2-3 weeks' time, at which point they will be disclosed by telephone to Ms. Beltrami, as will any additional recommendations warranted by these results. Ms. Schicker will receive a summary of her genetic counseling visit and a copy of her results once available. This information will also be available in Epic.   Ms. Binkowski questions were answered to her satisfaction today. Our contact information was provided should additional questions or concerns arise. Thank you for the referral and allowing [place] to share in the care of your patient.   Lacy Duverney, MS, [facility] Genetic Counselor [email or web address] Phone: [phone]  The patient was seen for a total of 20 minutes in face-to-face genetic counseling.  Dr. Blake Divine was available for discussion regarding this case.   _______________________________________________________________________ For Office Staff:  Number of people involved in session: 1 Was an Intern/ student involved with case: no

## 2023-01-13 ENCOUNTER — Telehealth: Payer: Self-pay | Admitting: Student in an Organized Health Care Education/Training Program

## 2023-01-13 NOTE — Telephone Encounter (Signed)
Patient advised that Breztri inhaler prescription was sent to [facility] pharmacy. I also confirmed with pharmacist at [facility]. They will get medication ready for her today. Nothing further needed.

## 2023-01-13 NOTE — Telephone Encounter (Signed)
Pt calling in bc her medication has not been refilled and she does not know what the prescription was

## 2023-01-14 ENCOUNTER — Telehealth: Payer: Self-pay | Admitting: Pulmonary Disease

## 2023-01-14 NOTE — Telephone Encounter (Signed)
Spoke with pt very SOB Advised to go to ED

## 2023-01-16 ENCOUNTER — Telehealth: Payer: Self-pay | Admitting: Student in an Organized Health Care Education/Training Program

## 2023-01-16 ENCOUNTER — Ambulatory Visit
Admission: RE | Admit: 2023-01-16 | Discharge: 2023-01-16 | Disposition: A | Discharge status: Home / Self Care | Payer: Medicaid Other | Source: Ambulatory Visit | Attending: Oncology | Admitting: Oncology

## 2023-01-16 DIAGNOSIS — K6289 Other specified diseases of anus and rectum: Secondary | ICD-10-CM | POA: Insufficient documentation

## 2023-01-16 NOTE — Telephone Encounter (Signed)
ATC patient--line rang for >82min. Voicemail did not come on to leave vm. Breztri sent to [facility] on 01/11/2023. Spoke to [employer] with [employer]. She stated that PA is needed. She will fax over PA request.

## 2023-01-16 NOTE — Telephone Encounter (Signed)
Patient is returning missed call.  Patient also needs medication Breztri to be filled.  Pharmacy [facility] on [address]

## 2023-01-16 NOTE — Telephone Encounter (Signed)
Lm x1 for patient.  

## 2023-01-16 NOTE — Telephone Encounter (Signed)
Patient called answering service states having symptom of shortness of breath. Patient phone number is [phone].

## 2023-01-16 NOTE — Telephone Encounter (Signed)
Patient states Yvonne Scott not called into pharmacy. Patient phone number is [phone].

## 2023-01-17 ENCOUNTER — Other Ambulatory Visit (HOSPITAL_COMMUNITY): Payer: Self-pay

## 2023-01-17 NOTE — Telephone Encounter (Signed)
Has the patient tried and failed TWO formulary preferred medications? If only one preferred drug is available, then has the patient tried and failed the one preferred drug? The preferred drug list can be found at: [email or web address]

## 2023-01-17 NOTE — Telephone Encounter (Addendum)
Spoke to patient. She is aware that we are awaiting a PA for [facility].  She stated that SOB is baseline since last OV. No worsening sx.    PA team, please start [place] PA.

## 2023-01-17 NOTE — Telephone Encounter (Signed)
I have reviewed patient's medication history. It does not appear that she has tried and failed two of the preferred meds.   Dr. Aundria Rud, please advise on alternative. Thanks

## 2023-01-19 ENCOUNTER — Ambulatory Visit
Admission: RE | Admit: 2023-01-19 | Discharge: 2023-01-19 | Disposition: A | Discharge status: Home / Self Care | Payer: Medicaid Other | Source: Ambulatory Visit | Attending: Radiation Oncology | Admitting: Radiation Oncology

## 2023-01-19 ENCOUNTER — Encounter: Payer: Self-pay | Admitting: Radiation Oncology

## 2023-01-19 VITALS — BP 129/88 | HR 92 | Temp 97.8°F | Resp 20 | Wt 123.0 lb

## 2023-01-19 DIAGNOSIS — C20 Malignant neoplasm of rectum: Secondary | ICD-10-CM

## 2023-01-19 DIAGNOSIS — C3411 Malignant neoplasm of upper lobe, right bronchus or lung: Secondary | ICD-10-CM | POA: Insufficient documentation

## 2023-01-19 DIAGNOSIS — Z510 Encounter for antineoplastic radiation therapy: Secondary | ICD-10-CM | POA: Diagnosis not present

## 2023-01-19 DIAGNOSIS — Z85048 Personal history of other malignant neoplasm of rectum, rectosigmoid junction, and anus: Secondary | ICD-10-CM | POA: Diagnosis not present

## 2023-01-19 DIAGNOSIS — F17210 Nicotine dependence, cigarettes, uncomplicated: Secondary | ICD-10-CM | POA: Insufficient documentation

## 2023-01-19 DIAGNOSIS — C3491 Malignant neoplasm of unspecified part of right bronchus or lung: Secondary | ICD-10-CM

## 2023-01-19 DIAGNOSIS — Z853 Personal history of malignant neoplasm of breast: Secondary | ICD-10-CM | POA: Insufficient documentation

## 2023-01-19 NOTE — Telephone Encounter (Signed)
Dr. Aundria Rud, please confirm dosage for Symbicort and Spiriva. Thank you.

## 2023-01-19 NOTE — Telephone Encounter (Signed)
Lm x1 for patient.  

## 2023-01-19 NOTE — Consult Note (Signed)
NEW PATIENT EVALUATION  Name: Yvonne Scott  MRN: [id number]  Date:   01/19/2023     DOB: 03-10-1961   This 62 y.o. female patient presents to the clinic for initial evaluation of SBRT for a right upper lobe lung cancer and patient with 3 carcinomas including invasive ductal carcinoma the right breast rectal adenocarcinoma and squamous cell carcinoma of the..  REFERRING PHYSICIAN: Scoggins, Amber, NP  CHIEF COMPLAINT:  Chief Complaint  Patient presents with   Lung Cancer    DIAGNOSIS: The primary encounter diagnosis was Squamous cell lung cancer, right (HCC). A diagnosis of Rectal adenocarcinoma (HCC) was also pertinent to this visit.   PREVIOUS INVESTIGATIONS:  Pelvic MRI CT scans and PET CT scans all reviewed Clinical notes reviewed Pathology report reviewed  HPI: Patient is a 62 year old female seen today for possible treatment with SBRT to her right lung squamous cell carcinoma.  She also has a history of invasive ductal carcinoma right breast treated with I believe neoadjuvant chemotherapy followed by mastectomy for a pT2 N0 M0 grade 3 triple negative disease.  She also has a clinical stage I adenocarcinoma of the right that they are in the midst of possibly planning surgical resection.  She has 3 solid bilateral pulmonary nodules since 89 the largest being a 0.9 cm in the anterior medial left upper lobe highly suspicious for pulmonary metastasis.  No thoracic adenopathy was noted.  PET scan showed hypermetabolic bilateral pulmonary nodules consistent with metastasis no other evidence of avid metastatic disease.  She had bronchoscopy showing squamous cell carcinoma the right upper lobe lesion.  We presented her case at tumor conference and recommendation was made for SBRT to this lesion.  She is clinically doing well specifically [place] cough hemoptysis or chest tightness.  PLANNED TREATMENT REGIMEN: SBRT to right upper lobe squamous cell carcinoma  PAST MEDICAL HISTORY:  has a  past medical history of Acute kidney injury (HCC), Anxiety, Aortic atherosclerosis (HCC) (10/24/2016), Breast cancer (HCC), Chemotherapy-induced neuropathy (HCC), Essential hypertension, GERD (gastroesophageal reflux disease), Hypokalemia, Hypomagnesemia, Invasive ductal carcinoma of right breast (HCC), Metabolic acidosis, Personal history of chemotherapy, Pneumonia, Severe protein-calorie malnutrition (HCC), and Thrombocytopenia (HCC).    PAST SURGICAL HISTORY:  Past Surgical History:  Procedure Laterality Date   BREAST BIOPSY Right 2022   BRONCHIAL BIOPSY  12/29/2022   Procedure: BRONCHIAL BIOPSIES;  Surgeon: Raechel Chute, MD;  Location: [facility];  Service: Pulmonary;;   BRONCHIAL NEEDLE ASPIRATION BIOPSY  12/29/2022   Procedure: BRONCHIAL NEEDLE ASPIRATION BIOPSIES;  Surgeon: Raechel Chute, MD;  Location: [facility];  Service: Pulmonary;;   COLONOSCOPY WITH PROPOFOL N/A 12/07/2022   Procedure: COLONOSCOPY WITH PROPOFOL;  Surgeon: Toney Reil, MD;  Location: [facility] ENDOSCOPY;  Service: Gastroenterology;  Laterality: N/A;   FOOT SURGERY Left    little toe correction   MASTECTOMY W/ SENTINEL NODE BIOPSY Right 10/19/2021   Procedure: MASTECTOMY WITH SENTINEL LYMPH NODE BIOPSY ( modified radical);  Surgeon: Carolan Shiver, MD;  Location: [facility] ORS;  Service: General;  Laterality: Right;   POLYPECTOMY  12/07/2022   Procedure: POLYPECTOMY;  Surgeon: Toney Reil, MD;  Location: [facility] ENDOSCOPY;  Service: Gastroenterology;;   PORTA CATH INSERTION  01/19/2021   SUBMUCOSAL TATTOO INJECTION  12/07/2022   Procedure: SUBMUCOSAL TATTOO INJECTION;  Surgeon: Toney Reil, MD;  Location: [facility] ENDOSCOPY;  Service: Gastroenterology;;   VIDEO BRONCHOSCOPY WITH ENDOBRONCHIAL ULTRASOUND N/A 12/29/2022   Procedure: VIDEO BRONCHOSCOPY WITH ENDOBRONCHIAL ULTRASOUND;  Surgeon: Raechel Chute, MD;  Location: [facility] ENDOS[facility];  Service:  Pulmonary;  Laterality: N/A;    FAMILY HISTORY: family  history includes Breast cancer in her paternal grandmother; Cirrhosis in her maternal grandfather; Melanoma (age of onset: 94) in her father.  SOCIAL HISTORY:  reports that she has been smoking cigarettes. She started smoking about 43 years ago. She has a 43.8 pack-year smoking history. She has never used smokeless tobacco. She reports that she does not currently use alcohol after a past usage of about 3.0 standard drinks of alcohol per week. She reports that she does not currently use drugs.  ALLERGIES: Patient has no known allergies.  MEDICATIONS:  Current Outpatient Medications  Medication Sig Dispense Refill   amoxicillin-clavulanate (AUGMENTIN) 875-125 MG tablet Take 1 tablet by mouth 2 (two) times daily. 10 tablet 0   Budeson-Glycopyrrol-Formoterol (BREZTRI AEROSPHERE) 160-9-4.8 MCG/ACT AERO Inhale 2 puffs into the lungs in the morning and at bedtime. 1 each 12   fluticasone (FLONASE) 50 MCG/ACT nasal spray Place 1 spray into both nostrils daily.     ibuprofen (ADVIL) 200 MG tablet Take 200-400 mg by mouth every 6 (six) hours as needed for headache.     lidocaine-prilocaine (EMLA) cream APPLY TO AFFECTED AREA ONCE 30 g 0   magnesium chloride (SLOW-MAG) 64 MG TBEC SR tablet Take 1 tablet (64 mg total) by mouth 2 (two) times daily. 60 tablet 2   naloxone (NARCAN) nasal spray 4 mg/0.1 mL      omeprazole (PRILOSEC) 40 MG capsule Take 1 capsule (40 mg total) by mouth daily. 90 capsule 0   potassium chloride (MICRO-K) 10 MEQ CR capsule TAKE 1 CAPSULE BY MOUTH ONCE DAILY WITH FUROSEMIDE TO AVOID LOW POTASSIUM OR CRAMPS 30 capsule 0   Vitamin D, Ergocalciferol, (DRISDOL) 1.25 MG (50000 UNIT) CAPS capsule Take 50,000 Units by mouth once a week.     Calcium Carb-Cholecalciferol (OYSTER SHELL CALCIUM W/D) 500-5 MG-MCG TABS Take by mouth. (Patient not taking: Reported on 12/27/2022)     calcium carbonate (TUMS EX) 750 MG chewable tablet Chew 2 tablets by mouth 4 (four) times daily as needed for  heartburn. (Patient not taking: Reported on 12/27/2022)     guaiFENesin (MUCINEX) 600 MG 12 hr tablet Take 600 mg by mouth 2 (two) times daily. (Patient not taking: Reported on 01/19/2023)     VENTOLIN HFA 108 (90 Base) MCG/ACT inhaler INHALE 1 PUFF BY MOUTH EVERY 6 HOURS AS NEEDED FOR SHORTNESS OF BREATH FOR WHEEZING (Patient not taking: Reported on 01/19/2023) 18 g 0   No current facility-administered medications for this encounter.   Facility-Administered Medications Ordered in Other Encounters  Medication Dose Route Frequency Provider Last Rate Last Admin   famotidine (PEPCID) 20-0.9 MG/50ML-% IVPB             ECOG PERFORMANCE STATUS:  0 - Asymptomatic  REVIEW OF SYSTEMS: Patient is multiple cancers as outlined above. Patient denies any weight loss, fatigue, weakness, fever, chills or night sweats. Patient denies any loss of vision, blurred vision. Patient denies any ringing  of the ears or hearing loss. No irregular heartbeat. Patient denies heart murmur or history of fainting. Patient denies any chest pain or pain radiating to her upper extremities. Patient denies any shortness of breath, difficulty breathing at night, cough or hemoptysis. Patient denies any swelling in the lower legs. Patient denies any nausea vomiting, vomiting of blood, or coffee ground material in the vomitus. Patient denies any stomach pain. Patient states has had normal bowel movements no significant constipation or diarrhea. Patient denies any dysuria,  hematuria or significant nocturia. Patient denies any problems walking, swelling in the joints or loss of balance. Patient denies any skin changes, loss of hair or loss of weight. Patient denies any excessive worrying or anxiety or significant depression. Patient denies any problems with insomnia. Patient denies excessive thirst, polyuria, polydipsia. Patient denies any swollen glands, patient denies easy bruising or easy bleeding. Patient denies any recent infections,  allergies or URI. Patient "s visual fields have not changed significantly in recent time.   PHYSICAL EXAM: BP 129/88   Pulse 92   Temp 97.8 F (36.6 C) (Tympanic)   Resp 20   Wt 123 lb (55.8 kg)   LMP 11/19/2002 (Approximate)   BMI 22.50 kg/m  Well-developed well-nourished patient in NAD. HEENT reveals PERLA, EOMI, discs not visualized.  Oral cavity is clear. No oral mucosal lesions are identified. Neck is clear without evidence of cervical or supraclavicular adenopathy. Lungs are clear to A&P. Cardiac examination is essentially unremarkable with regular rate and rhythm without murmur rub or thrill. Abdomen is benign with no organomegaly or masses noted. Motor sensory and DTR levels are equal and symmetric in the upper and lower extremities. Cranial nerves II through XII are grossly intact. Proprioception is intact. No peripheral adenopathy or edema is identified. No motor or sensory levels are noted. Crude visual fields are within normal range.  LABORATORY DATA: Pathology and cytology reports reviewed    RADIOLOGY RESULTS: CT scans PET CT scans reviewed compatible with above-stated findings   IMPRESSION: Squamous of carcinoma the right upper lobe with positive cytology by bronchoscopy and 62 year old female with both breast cancer as well as rectal cancer for SBRT to the right upper lobe biopsy positive lesion.  PLAN: This time of recommended SBRT to her right upper lobe lesion.  I believe we can continue to follow some of the other lesions and offer SBRT should they become increasing in size.  I would plan on delivering 50 Gray in 5 fractions.  Risks and benefits of treatment occluding extremely low side effect profile were reviewed with the patient.  I personally 7 ordered CT simulation for next week.  I would like to take this opportunity to thank you for allowing me to participate in the care of your patient.[name], MD

## 2023-01-20 MED ORDER — SPIRIVA RESPIMAT 2.5 MCG/ACT IN AERS
2.0000 | INHALATION_SPRAY | Freq: Every day | RESPIRATORY_TRACT | 11 refills | Status: DC
Start: 2023-01-20 — End: 2023-06-28

## 2023-01-20 MED ORDER — BUDESONIDE-FORMOTEROL FUMARATE 160-4.5 MCG/ACT IN AERO: 2.0000 | INHALATION_SPRAY | Freq: Two times a day (BID) | RESPIRATORY_TRACT | 12 refills | Status: DC

## 2023-01-20 NOTE — Telephone Encounter (Signed)
Symbicort and spiriva has been sent to preferred pharmacy.  Patient is aware and voiced her understanding.  Nothing further needed.

## 2023-01-24 ENCOUNTER — Other Ambulatory Visit: Payer: Self-pay | Admitting: Radiation Oncology

## 2023-01-24 ENCOUNTER — Encounter: Payer: Self-pay | Admitting: Licensed Clinical Social Worker

## 2023-01-24 ENCOUNTER — Ambulatory Visit: Payer: Self-pay | Admitting: Licensed Clinical Social Worker

## 2023-01-24 ENCOUNTER — Telehealth: Payer: Self-pay | Admitting: Licensed Clinical Social Worker

## 2023-01-24 DIAGNOSIS — Z1379 Encounter for other screening for genetic and chromosomal anomalies: Secondary | ICD-10-CM | POA: Insufficient documentation

## 2023-01-24 DIAGNOSIS — C3411 Malignant neoplasm of upper lobe, right bronchus or lung: Secondary | ICD-10-CM

## 2023-01-24 NOTE — Telephone Encounter (Signed)
I contacted Ms. Sedivy to discuss her genetic testing results. No pathogenic variants were identified in the 70 genes analyzed. Detailed clinic note to follow.   The test report has been scanned into EPIC and is located under the Molecular Pathology section of the Results Review tab.  A portion of the result report is included below for reference.      Faith Rogue, MS, [facility] Genetic Counselor [email or web address] Phone: [phone]

## 2023-01-24 NOTE — Telephone Encounter (Signed)
error 

## 2023-01-24 NOTE — Progress Notes (Signed)
HPI:   Yvonne Scott was previously seen in the [facility] clinic due to a personal and family history of cancer and concerns regarding a hereditary predisposition to cancer. Please refer to our prior cancer genetics clinic note for more information regarding our discussion, assessment and recommendations, at the time. Yvonne Scott recent genetic test results were disclosed to her, as were recommendations warranted by these results. These results and recommendations are discussed in more detail below.  CANCER HISTORY:  Oncology History  Invasive ductal carcinoma of right breast (HCC)  11/11/2020 Genetic Testing   Genetic testing done at [facility].  Per note, negative.   02/20/2021 Initial Diagnosis   Invasive ductal carcinoma of right breast (HCC)\  -August 2022, patient was diagnosed with right breast stage IIIb cT3 N0M0, grade 2, ER negative, PR negative HER2 negative breast cancer.  Patient self palpated breast mass many years ago.  10/21/2020 diagnostic mammogram and ultrasound confirmed presence of mass and a subsequent biopsy revealed right breast triple negative cancer. There was plan for patient to start with neoadjuvant chemotherapy followed by surgery and radiation.  Unfortunately patient never started treatment.    Medi port was placed at [facility].  Established care with me on 02/19/2021    02/24/2021 Imaging   CT chest abdomen pelvis showed large right breast mass, 5.5 x 4.8 cm with small right axillary lymph node with variable degrees of enhancement potentially involved but not specific based on size.  This tracked down to retropectoral nodal stations largest lymph node along the lateral margin of the pectoralis major.  Tiny pulmonary nodules in the chest as discussed.  These are nonspecific but would consider short interval follow-up to assess for any changes.  No evidence of metastatic disease involving the abdomen or pelvis.  Aortic atherosclerosis.   02/25/2021 Echocardiogram    echocardiogram showed LVEF 65 to 70%.  Patient has been to chemotherapy class and understands antiemetics instructions   03/11/2021 - 08/26/2021 Chemotherapy   BREAST Pembrolizumab (200) D1 + Carboplatin (5) D1 + Paclitaxel (80) D1,8,15 q21d X 4 cycles, followed by Pembrolizumab (200) D1 + AC D1 q21d x 3 cycles. 4th cycle was omitted due to poor tolerance.     03/16/2021 Imaging   bone scan showed no evidence of osseous metastatic breast cancer, asymmetric osseous uptake on the right at L5-S1, corresponding to degenerative disc disease on recent CT.  Asymmetric soft tissue uptake in the right breast   07/13/2021 Echocardiogram   Echocardiogram showed LVEF 60-65%.  Grade 1 diastolic dysfunction.  Refer details to echocardiogram reports.   10/19/2021 Surgery   Patient underwent right modified radical mastectomy  Pathology showed invasive mammary carcinoma, no special type, multifocal, with focal chondroid stroma.  Clip and biopsy sites identified.  12 lymph nodes negative for malignancy.  2 lymph nodes displaying dense fibrous scarring, compatible with pathological complete response.  Benign nipple/areola.   Grade 3, LVI not identified, DCIS not identified.  All margins negative for invasive carcinoma.   ypT2 ypN0  Comment Due to the presence of dense fibrosis and treatment effect in two of the sampled lymph nodes, pancytokeratin stains were performed on all submitted lymph nodes.  All lymph nodes are negative for residual metastatic carcinoma.  A separate focus of invasive carcinoma, measuring approximately 11 mm in greatest extent, and histologically similar to the primary tumor, is identified within sampling of the fibrous tissue adjacent to the primary tumor.    10/19/2021 Cancer Staging   Staging form: Breast, AJCC  8th Edition - Pathologic stage from 10/19/2021: No Stage Recommended (ypT2, pN0, cM0, G3, ER-, PR-, HER2-) - Signed by Rickard Patience, MD on 11/03/2021 Stage prefix: Post-therapy Response to  neoadjuvant therapy: Partial response Multigene prognostic tests performed: None Histologic grading system: 3 grade system   05/27/2022 Imaging   CT chest abdomen pelvis with contrast showed 1. Interval right mastectomy with resolution of suspicious right axillary nodes. 2. Enlargement of a 5 mm right lower lobe pulmonary nodule. Development of a 5 mm left upper lobe pulmonary nodule. Cannot exclude early metastatic disease. Consider chest CT follow-up at 3 months. 3. Otherwise, no evidence of metastatic disease in the chest, abdomen, or pelvis. 4. Age advanced coronary artery atherosclerosis. Recommend assessment of coronary risk factors. 5. Aortic atherosclerosis (ICD10-I70.0) and emphysema (ICD10-J43.9). 6. Mild enlargement of the common duct, which is now minimally dilated for age. Correlate with bilirubin levels. If elevated,consider ERCP.   09/09/2022 Imaging   CT chest without contrast showed 1. Interval growth of three solid bilateral pulmonary nodules since 05/25/2022 chest CT, largest 0.9 cm in the anteromedial left upper lobe, highly suspicious for pulmonary metastases. 2. No thoracic adenopathy. 3. Three-vessel coronary atherosclerosis. 4. Aortic Atherosclerosis (ICD10-I70.0) and Emphysema (ICD10-J43.9).     09/29/2022 Imaging   PET scan shows 1. Hypermetabolic bilateral pulmonary nodules, consistent with metastasis. 2. No other evidence of tracer avid metastatic disease, status post right mastectomy. 3. Hypermetabolism with suggestion of soft tissue fullness in the mid rectum, suspicious for polyp or carcinoma. Consider further evaluation with sigmoidoscopy. 4. Incidental findings, including: Aortic atherosclerosis (ICD10-I70.0), coronary artery atherosclerosis and emphysema (ICD10-J43.9).   Squamous cell lung cancer, right (HCC)  08/12/2022 Initial Diagnosis   Squamous cell lung cancer, right (HCC)   01/10/2023 Cancer Staging   Staging form: Lung, AJCC 8th Edition -  Clinical: cT1, cN0, cM0 - Signed by Rickard Patience, MD on 01/10/2023 Stage prefix: Initial diagnosis   Rectal adenocarcinoma (HCC)  10/13/2022 Initial Diagnosis   Rectal adenocarcinoma (HCC)   01/10/2023 Cancer Staging   Staging form: Colon and Rectum, AJCC 8th Edition - Clinical: Stage I (cT1, cN0, cM0) - Signed by Rickard Patience, MD on 01/10/2023 Stage prefix: Initial diagnosis Total positive nodes: 0     FAMILY HISTORY:  We obtained a detailed, 4-generation family history.  Significant diagnoses are listed below: Family History  Problem Relation Age of Onset   Melanoma Father 73   Cirrhosis Maternal Grandfather    Breast cancer Paternal Grandmother     Yvonne Scott has 1 daughter, 34, no cancers. She had 1 full brother, 1 full sister, both recently passed. She has a maternal half sister and paternal half sister, limited information.   Yvonne Scott mother is living at 13 and had breasts removed in her 30s, but patient reports this was not cancer. Maternal grandfather may have had cancer, he passed over age 17.   Yvonne Scott father passed of melanoma at 67. Paternal grandmother had breast cancer late in life and passed at 76.    Yvonne Scott is unaware of previous family history of genetic testing for hereditary cancer risks. There is no reported Ashkenazi Jewish ancestry. There is no known consanguinity.     GENETIC TEST RESULTS:  The [facility] Multi-Cancer+RNA Panel found no pathogenic mutations.   The Multi-Cancer + RNA Panel offered by [facility] includes sequencing and/or deletion/duplication analysis of the following 70 genes:  AIP*, ALK, APC*, ATM*, AXIN2*, BAP1*, BARD1*, BLM*, BMPR1A*, BRCA1*, BRCA2*, BRIP1*, CDC73*, CDH1*, CDK4, CDKN1B*, CDKN2A, CHEK2*,  CTNNA1*, DICER1*, EPCAM, EGFR, FH*, FLCN*, GREM1, HOXB13, KIT, LZTR1, MAX*, MBD4, MEN1*, MET, MITF, MLH1*, MSH2*, MSH3*, MSH6*, MUTYH*, NF1*, NF2*, NTHL1*, PALB2*, PDGFRA, PMS2*, POLD1*, POLE*, POT1*, PRKAR1A*, PTCH1*, PTEN*, RAD51C*,  RAD51D*, RB1*, RET, SDHA*, SDHAF2*, SDHB*, SDHC*, SDHD*, SMAD4*, SMARCA4*, SMARCB1*, SMARCE1*, STK11*, SUFU*, TMEM127*, TP53*, TSC1*, TSC2*, VHL*. RNA analysis is performed for * genes.   The test report has been scanned into EPIC and is located under the Molecular Pathology section of the Results Review tab.  A portion of the result report is included below for reference. Genetic testing reported out on 01/19/2023.    Genetic testing identified a variant of uncertain significance (VUS) in the DICER1 gene.  At this time, it is unknown if this variant is associated with an increased risk for cancer or if it is benign, but most uncertain variants are reclassified to benign. It should not be used to make medical management decisions. With time, we suspect the laboratory will determine the significance of this variant, if any. If the laboratory reclassifies this variant, we will attempt to contact Yvonne Scott to discuss it further.   Even though a pathogenic variant was not identified, possible explanations for the cancer in the family may include: There may be no hereditary risk for cancer in the family. The cancers in Yvonne Scott and/or her family may be sporadic/familial or due to other genetic and environmental factors. There may be a gene mutation in one of these genes that current testing methods cannot detect but that chance is small. There could be another gene that has not yet been discovered, or that we have not yet tested, that is responsible for the cancer diagnoses in the family.  It is also possible there is a hereditary cause for the cancer in the family that Yvonne Scott did not inherit.  Therefore, it is important to remain in touch with cancer genetics in the future so that we can continue to offer Yvonne Scott the most up to date genetic testing.   ADDITIONAL GENETIC TESTING:  We discussed with Yvonne Scott that her genetic testing was fairly extensive.  If there are additional relevant  genes identified to increase cancer risk that can be analyzed in the future, we would be happy to discuss and coordinate this testing at that time.    CANCER SCREENING RECOMMENDATIONS:  Yvonne Scott test result is considered negative (normal).  This means that we have not identified a hereditary cause for her personal and family history of cancer at this time.   An individual's cancer risk and medical management are not determined by genetic test results alone. Overall cancer risk assessment incorporates additional factors, including personal medical history, family history, and any available genetic information that may result in a personalized plan for cancer prevention and surveillance. Therefore, it is recommended she continue to follow the cancer management and screening guidelines provided by her oncology and primary healthcare provider.  RECOMMENDATIONS FOR FAMILY MEMBERS:   Since she did not inherit a identifiable mutation in a cancer predisposition gene included on this panel, her children could not have inherited a known mutation from her in one of these genes. Individuals in this family might be at some increased risk of developing cancer, over the general population risk, due to the family history of cancer.  Individuals in the family should notify their providers of the family history of cancer. We recommend women in this family have a yearly mammogram beginning at age 73, or 52 years younger than the  earliest onset of cancer, an annual clinical breast exam, and perform monthly breast self-exams.  Family members should have colonoscopies by at age 76, or earlier, as recommended by their providers. We do not recommend familial testing for the DICER1 variant of uncertain significance (VUS).  FOLLOW-UP:  Lastly, we discussed with Yvonne Scott that cancer genetics is a rapidly advancing field and it is possible that new genetic tests will be appropriate for her and/or her family members in the  future. We encouraged her to remain in contact with cancer genetics on an annual basis so we can update her personal and family histories and let her know of advances in cancer genetics that may benefit this family.   Our contact number was provided. Yvonne Scott questions were answered to her satisfaction, and she knows she is welcome to call us at anytime with additional questions or concerns.    Lacy Duverney, MS, [facility] Genetic Counselor [email or web address] Phone: [phone]

## 2023-01-25 ENCOUNTER — Ambulatory Visit: Payer: Medicaid Other | Admitting: Student in an Organized Health Care Education/Training Program

## 2023-01-27 ENCOUNTER — Ambulatory Visit
Admission: RE | Admit: 2023-01-27 | Discharge: 2023-01-27 | Disposition: A | Discharge status: Home / Self Care | Payer: Medicaid Other | Source: Ambulatory Visit | Attending: Radiation Oncology | Admitting: Radiation Oncology

## 2023-01-27 DIAGNOSIS — C3411 Malignant neoplasm of upper lobe, right bronchus or lung: Secondary | ICD-10-CM

## 2023-01-27 DIAGNOSIS — Z510 Encounter for antineoplastic radiation therapy: Secondary | ICD-10-CM | POA: Diagnosis not present

## 2023-01-30 ENCOUNTER — Ambulatory Visit: Payer: Medicaid Other | Admitting: Student in an Organized Health Care Education/Training Program

## 2023-01-30 DIAGNOSIS — Z510 Encounter for antineoplastic radiation therapy: Secondary | ICD-10-CM | POA: Diagnosis not present

## 2023-02-02 ENCOUNTER — Telehealth: Payer: Self-pay

## 2023-02-02 NOTE — Telephone Encounter (Signed)
-----   Message from Rickard Patience sent at 02/01/2023  8:53 PM EST ----- Please arrange patient to see me to review results.

## 2023-02-02 NOTE — Telephone Encounter (Signed)
Yvonne Scott can you please arrange for follow-up with Dr. Cathie Hoops.

## 2023-02-06 ENCOUNTER — Inpatient Hospital Stay: Payer: Medicaid Other | Attending: Oncology

## 2023-02-06 ENCOUNTER — Encounter: Payer: Self-pay | Admitting: Oncology

## 2023-02-06 ENCOUNTER — Telehealth: Payer: Self-pay | Admitting: Oncology

## 2023-02-06 ENCOUNTER — Inpatient Hospital Stay: Payer: Medicaid Other

## 2023-02-06 ENCOUNTER — Inpatient Hospital Stay (HOSPITAL_BASED_OUTPATIENT_CLINIC_OR_DEPARTMENT_OTHER): Payer: Medicaid Other | Admitting: Oncology

## 2023-02-06 VITALS — BP 150/86 | HR 100 | Temp 99.2°F | Wt 123.8 lb

## 2023-02-06 DIAGNOSIS — C50911 Malignant neoplasm of unspecified site of right female breast: Secondary | ICD-10-CM | POA: Diagnosis not present

## 2023-02-06 DIAGNOSIS — C3411 Malignant neoplasm of upper lobe, right bronchus or lung: Secondary | ICD-10-CM | POA: Insufficient documentation

## 2023-02-06 DIAGNOSIS — K6289 Other specified diseases of anus and rectum: Secondary | ICD-10-CM | POA: Diagnosis not present

## 2023-02-06 DIAGNOSIS — C20 Malignant neoplasm of rectum: Secondary | ICD-10-CM | POA: Diagnosis not present

## 2023-02-06 DIAGNOSIS — R109 Unspecified abdominal pain: Secondary | ICD-10-CM | POA: Diagnosis not present

## 2023-02-06 DIAGNOSIS — F17210 Nicotine dependence, cigarettes, uncomplicated: Secondary | ICD-10-CM | POA: Diagnosis not present

## 2023-02-06 DIAGNOSIS — C3491 Malignant neoplasm of unspecified part of right bronchus or lung: Secondary | ICD-10-CM

## 2023-02-06 DIAGNOSIS — Z171 Estrogen receptor negative status [ER-]: Secondary | ICD-10-CM | POA: Diagnosis not present

## 2023-02-06 DIAGNOSIS — Z95828 Presence of other vascular implants and grafts: Secondary | ICD-10-CM

## 2023-02-06 DIAGNOSIS — Z720 Tobacco use: Secondary | ICD-10-CM

## 2023-02-06 MED ORDER — DICYCLOMINE HCL 10 MG PO CAPS: 10.0000 mg | ORAL_CAPSULE | Freq: Three times a day (TID) | ORAL | 0 refills | Status: DC

## 2023-02-06 MED ORDER — HEPARIN SOD (PORK) LOCK FLUSH 100 UNIT/ML IV SOLN
500.0000 [IU] | Freq: Once | INTRAVENOUS | Status: AC
Start: 2023-02-06 — End: 2023-02-06
  Administered 2023-02-06: 500 [IU] via INTRAVENOUS
  Filled 2023-02-06: qty 5

## 2023-02-06 MED ORDER — SODIUM CHLORIDE 0.9% FLUSH
10.0000 mL | Freq: Once | INTRAVENOUS | Status: AC
  Administered 2023-02-06: 10 mL via INTRAVENOUS
  Filled 2023-02-06: qty 10

## 2023-02-06 NOTE — Assessment & Plan Note (Addendum)
Clinical stage III triple negative right breast cancer. cT3 cN0-1 S/p  neoadjuvant carboplatin Taxol Keytruda followed by [facility] with Keytruda x 3.   4th cycle of AC with Rande Lawman was not given due to poor tolerance. Status post right mastectomy with sentinel lymph node biopsy. ypT2 ypN0, residual disease, Patient declines any adjuvant chemotherapy or immunotherapy She has multiple FDG lung nodules.  Lingular nodule was not able to be biopsied.  Continue clinical surveillance genetic counseling showed DICER VUS

## 2023-02-06 NOTE — Assessment & Plan Note (Signed)
Smoke cessation was discussed with patient.

## 2023-02-06 NOTE — Telephone Encounter (Signed)
Addendum : Patient called on 02/06/23 evening and reports that she has abdomen spasm.  Patient sounds very anxious and she is not able to give me any details regarding time of onset symptoms, location and severity. She says "something is not right".  I recommend trial of Bentyl, Rx sent to pharmacy. Also recommend her to go to ER for evaluation if pain is severe. She declines.  Will arrange her to get CT abdomen for further evaluation

## 2023-02-06 NOTE — Progress Notes (Addendum)
Hematology/Oncology Progress note [phone] [phone]       CHIEF COMPLAINTS/REASON FOR VISIT:  Follow-up for triple negative right breast cancer, squamous cell lung cancer, rectal adenocarcinoma   ASSESSMENT & PLAN:   Cancer Staging  Invasive ductal carcinoma of right breast (HCC) Staging form: Breast, AJCC 8th Edition - Pathologic stage from 10/19/2021: No Stage Recommended (ypT2, pN0, cM0, G3, ER-, PR-, HER2-) - Signed by Yvonne Patience, MD on 11/03/2021  Rectal adenocarcinoma [facility]) Staging form: Colon and Rectum, AJCC 8th Edition - Clinical: Stage I (cT2, cN0, cM0) - Signed by Yvonne Patience, MD on 02/01/2023  Squamous cell lung cancer, right [facility]) Staging form: Lung, AJCC 8th Edition - Clinical: cT1, cN0, cM0 - Signed by Yvonne Patience, MD on 01/10/2023   Invasive ductal carcinoma of right breast (HCC) Clinical stage III triple negative right breast cancer. cT3 cN0-1 S/p  neoadjuvant carboplatin Taxol Keytruda followed by [facility] with Keytruda x 3.   4th cycle of AC with Rande Lawman was not given due to poor tolerance. Status post right mastectomy with sentinel lymph node biopsy. ypT2 ypN0, residual disease, Patient declines any adjuvant chemotherapy or immunotherapy She has multiple FDG lung nodules.  Lingular nodule was not able to be biopsied.  Continue clinical surveillance genetic counseling showed DICER VUS  Squamous cell lung cancer, right (HCC) Bronchoscopy biopsy of right upper lobe nodule showed squamous cell carcinoma. Other lung nodule was not able to be biopsied due to location and extract for pneumothorax. Pathology results were reviewed and discussed with patient. Patient has COPD/emphysema, more than 1 suspicious lung nodule, likely not a surgical candidate In addition she also has another competing primary malignancy. follow-up with Dr. Rushie [facility] for lung SBRT.  Rectal adenocarcinoma (HCC) T2N0 disease.  Recommend patient to get surgical evaluation. Refer  to colorectal surgeon [facility].   Port-A-Cath in place She will get [name] flush Q6-8 weeks   Tobacco abuse Smoke cessation was discussed with patient.   Pain in the abdomen Abdomen spasm/pain.  I recommend trial of Bentyl, Rx sent to pharmacy. Also recommend her to go to ER for evaluation if pain is severe. She declines.  Will obtain CT abdomen w contrast for further evaluation.     Orders Placed This Encounter  Procedures   Ambulatory referral to General Surgery    Referral Priority:   Routine    Referral Type:   Surgical    Referral Reason:   Specialty Services Required    Requested Specialty:   General Surgery    Number of Visits Requested:   1   Follow-up in 6 weeks All questions were answered. The patient knows to call the clinic with any problems, questions or concerns.  Yvonne Patience, MD, PhD [facility] 02/06/2023        HISTORY OF PRESENTING ILLNESS:   [facility] is a  62 y.o.  female presents for follow-up of triple negative breast cancer, lung squamous cancer and rectal cancer.  Oncology History  Invasive ductal carcinoma of right breast (HCC)  11/11/2020 Genetic Testing   Genetic testing done at [facility].  Per note, negative.   02/20/2021 Initial Diagnosis   Invasive ductal carcinoma of right breast (HCC)\  -August 2022, patient was diagnosed with right breast stage IIIb cT3 N0M0, grade 2, ER negative, PR negative HER2 negative breast cancer.  Patient self palpated breast mass many years ago.  10/21/2020 diagnostic mammogram and ultrasound confirmed presence of mass and a subsequent biopsy revealed right breast triple  negative cancer. There was plan for patient to start with neoadjuvant chemotherapy followed by surgery and radiation.  Unfortunately patient never started treatment.    Medi port was placed at [facility].  Established care with me on 02/19/2021    02/24/2021 Imaging   CT chest abdomen pelvis showed large right breast mass, 5.5  x 4.8 cm with small right axillary lymph node with variable degrees of enhancement potentially involved but not specific based on size.  This tracked down to retropectoral nodal stations largest lymph node along the lateral margin of the pectoralis major.  Tiny pulmonary nodules in the chest as discussed.  These are nonspecific but would consider short interval follow-up to assess for any changes.  No evidence of metastatic disease involving the abdomen or pelvis.  Aortic atherosclerosis.   02/25/2021 Echocardiogram   echocardiogram showed LVEF 65 to 70%.  Patient has been to chemotherapy class and understands antiemetics instructions   03/11/2021 - 08/26/2021 Chemotherapy   BREAST Pembrolizumab (200) D1 + Carboplatin (5) D1 + Paclitaxel (80) D1,8,15 q21d X 4 cycles, followed by Pembrolizumab (200) D1 + AC D1 q21d x 3 cycles. 4th cycle was omitted due to poor tolerance.     03/16/2021 Imaging   bone scan showed no evidence of osseous metastatic breast cancer, asymmetric osseous uptake on the right at L5-S1, corresponding to degenerative disc disease on recent CT.  Asymmetric soft tissue uptake in the right breast   07/13/2021 Echocardiogram   Echocardiogram showed LVEF 60-65%.  Grade 1 diastolic dysfunction.  Refer details to echocardiogram reports.   10/19/2021 Surgery   Patient underwent right modified radical mastectomy  Pathology showed invasive mammary carcinoma, no special type, multifocal, with focal chondroid stroma.  Clip and biopsy sites identified.  12 lymph nodes negative for malignancy.  2 lymph nodes displaying dense fibrous scarring, compatible with pathological complete response.  Benign nipple/areola.   Grade 3, LVI not identified, DCIS not identified.  All margins negative for invasive carcinoma.   ypT2 ypN0  Comment Due to the presence of dense fibrosis and treatment effect in two of the sampled lymph nodes, pancytokeratin stains were performed on all submitted lymph nodes.  All  lymph nodes are negative for residual metastatic carcinoma.  A separate focus of invasive carcinoma, measuring approximately 11 mm in greatest extent, and histologically similar to the primary tumor, is identified within sampling of the fibrous tissue adjacent to the primary tumor.    10/19/2021 Cancer Staging   Staging form: Breast, AJCC 8th Edition - Pathologic stage from 10/19/2021: No Stage Recommended (ypT2, pN0, cM0, G3, ER-, PR-, HER2-) - Signed by Yvonne Patience, MD on 11/03/2021 Stage prefix: Post-therapy Response to neoadjuvant therapy: Partial response Multigene prognostic tests performed: None Histologic grading system: 3 grade system   05/27/2022 Imaging   CT chest abdomen pelvis with contrast showed 1. Interval right mastectomy with resolution of suspicious right axillary nodes. 2. Enlargement of a 5 mm right lower lobe pulmonary nodule. Development of a 5 mm left upper lobe pulmonary nodule. Cannot exclude early metastatic disease. Consider chest CT follow-up at 3 months. 3. Otherwise, no evidence of metastatic disease in the chest, abdomen, or pelvis. 4. Age advanced coronary artery atherosclerosis. Recommend assessment of coronary risk factors. 5. Aortic atherosclerosis (ICD10-I70.0) and emphysema (ICD10-J43.9). 6. Mild enlargement of the common duct, which is now minimally dilated for age. Correlate with bilirubin levels. If elevated,consider ERCP.   09/09/2022 Imaging   CT chest without contrast showed 1. Interval growth of three solid  bilateral pulmonary nodules since 05/25/2022 chest CT, largest 0.9 cm in the anteromedial left upper lobe, highly suspicious for pulmonary metastases. 2. No thoracic adenopathy. 3. Three-vessel coronary atherosclerosis. 4. Aortic Atherosclerosis (ICD10-I70.0) and Emphysema (ICD10-J43.9).     09/29/2022 Imaging   PET scan shows 1. Hypermetabolic bilateral pulmonary nodules, consistent with metastasis. 2. No other evidence of tracer avid  metastatic disease, status post right mastectomy. 3. Hypermetabolism with suggestion of soft tissue fullness in the mid rectum, suspicious for polyp or carcinoma. Consider further evaluation with sigmoidoscopy. 4. Incidental findings, including: Aortic atherosclerosis (ICD10-I70.0), coronary artery atherosclerosis and emphysema (ICD10-J43.9).   Squamous cell lung cancer, right (HCC)  08/12/2022 Initial Diagnosis   Squamous cell lung cancer, right (HCC)   01/10/2023 Cancer Staging   Staging form: Lung, AJCC 8th Edition - Clinical: cT1, cN0, cM0 - Signed by Yvonne Patience, MD on 01/10/2023 Stage prefix: Initial diagnosis   Rectal adenocarcinoma (HCC)  10/13/2022 Initial Diagnosis   Rectal adenocarcinoma (HCC)  patient has large polyp and biopsy showed rectal adenocarcinoma-at least intramucosal. She was also recommended to have bronchoscopy ASAP for further evaluation of the lung nodules.  Patient adamantly declined pulmonology workup concurrently with colonoscopy.  She eventually underwent pulmonology workup with bronchoscopy biopsy of right upper lobe nodule and the results showed squamous cell carcinoma.   lingula nodule was not biopsied due to location and threat for pneumothorax.  Denies hematochezia,  black tarry stool or abdominal pain, change of bowel habits.   01/10/2023 Cancer Staging   Staging form: Colon and Rectum, AJCC 8th Edition - Clinical: Stage I (cT2, cN0, cM0) - Signed by Yvonne Patience, MD on 02/01/2023 Stage prefix: Initial diagnosis Total positive nodes: 0   02/01/2023 Imaging   MRI pelvis wo contrast  4.1 cm upper/mid rectal mass, as above. Rectal adenocarcinoma T stage: T2. Rectal adenocarcinoma N stage: N0. Distance from tumor to the internal anal sphincter is 7.0 cm    07/16/2021-07/19/2021, patient was admitted for HCAP patient was treated with antibiotics 09/04/2021 - 09/08/2021 Patient was admitted due to pancytopenia, thrush electrolyte abnormality.     INTERVAL  HISTORY [facility] is a 62 y.o. female who has above history reviewed by me today presents for follow up visit for management of triple negative breast cancer   Patient continues to smoke daily.  She was accompanied by mother today.  No fever or chills. She has established with Radonc and will proceed with SBRT to lung.  Had MRI pelvis done during the interval.  + abdomen pain and spasm.    Review of Systems  Constitutional:  Positive for fatigue. Negative for appetite change, chills and fever.  HENT:   Negative for hearing loss, mouth sores and voice change.   Eyes:  Negative for eye problems.  Respiratory:  Negative for chest tightness and cough.   Cardiovascular:  Negative for chest pain.  Gastrointestinal:  Negative for abdominal distention, abdominal pain, blood in stool, diarrhea and nausea.  Endocrine: Negative for hot flashes.  Genitourinary:  Negative for difficulty urinating, dysuria and frequency.   Musculoskeletal:  Positive for arthralgias and back pain.  Skin:  Negative for itching and rash.  Neurological:  Positive for numbness. Negative for extremity weakness.  Hematological:  Negative for adenopathy.  Psychiatric/Behavioral:  Negative for confusion. The patient is nervous/anxious.     MEDICAL HISTORY:  Past Medical History:  Diagnosis Date   Acute kidney injury (HCC)    Anxiety    Aortic atherosclerosis (HCC) 10/24/2016  Breast cancer (HCC)    Chemotherapy-induced neuropathy (HCC)    Essential hypertension    GERD (gastroesophageal reflux disease)    Hypokalemia    Hypomagnesemia    Invasive ductal carcinoma of right breast (HCC)    Metabolic acidosis    Personal history of chemotherapy    Pneumonia    Severe protein-calorie malnutrition (HCC)    Thrombocytopenia (HCC)     SURGICAL HISTORY: Past Surgical History:  Procedure Laterality Date   BREAST BIOPSY Right 2022   BRONCHIAL BIOPSY  12/29/2022   Procedure: BRONCHIAL BIOPSIES;  Surgeon:  Raechel Chute, MD;  Location: [facility] ENDOSCOPY;  Service: Pulmonary;;   BRONCHIAL NEEDLE ASPIRATION BIOPSY  12/29/2022   Procedure: BRONCHIAL NEEDLE ASPIRATION BIOPSIES;  Surgeon: Raechel Chute, MD;  Location: [facility] ENDOSCOPY;  Service: Pulmonary;;   COLONOSCOPY WITH PROPOFOL N/A 12/07/2022   Procedure: COLONOSCOPY WITH PROPOFOL;  Surgeon: Toney Reil, MD;  Location: [facility] ENDOSCOPY;  Service: Gastroenterology;  Laterality: N/A;   FOOT SURGERY Left    little toe correction   MASTECTOMY W/ SENTINEL NODE BIOPSY Right 10/19/2021   Procedure: MASTECTOMY WITH SENTINEL LYMPH NODE BIOPSY ( modified radical);  Surgeon: Carolan Shiver, MD;  Location: [facility] ORS;  Service: General;  Laterality: Right;   POLYPECTOMY  12/07/2022   Procedure: POLYPECTOMY;  Surgeon: Toney Reil, MD;  Location: [facility] ENDOSCOPY;  Service: Gastroenterology;;   PORTA CATH INSERTION  01/19/2021   SUBMUCOSAL TATTOO INJECTION  12/07/2022   Procedure: SUBMUCOSAL TATTOO INJECTION;  Surgeon: Toney Reil, MD;  Location: [facility] ENDOSCOPY;  Service: Gastroenterology;;   VIDEO BRONCHOSCOPY WITH ENDOBRONCHIAL ULTRASOUND N/A 12/29/2022   Procedure: VIDEO BRONCHOSCOPY WITH ENDOBRONCHIAL ULTRASOUND;  Surgeon: Raechel Chute, MD;  Location: [facility] ENDOSCOPY;  Service: Pulmonary;  Laterality: N/A;    SOCIAL HISTORY: Social History   Socioeconomic History   Marital status: Widowed    Spouse name: Not on file   Number of children: 1   Years of education: Not on file   Highest education level: Not on file  Occupational History   Not on file  Tobacco Use   Smoking status: Every Day    Current packs/day: 1.00    Average packs/day: 1 pack/day for 43.9 years (43.9 ttl pk-yrs)    Types: Cigarettes    Start date: 03/15/1979   Smokeless tobacco: Never  Vaping Use   Vaping status: Never Used  Substance and Sexual Activity   Alcohol use: Not Currently    Alcohol/week: 3.0 standard drinks of alcohol    Types: 3 Standard drinks or  equivalent per week   Drug use: Not Currently   Sexual activity: Not Currently    Birth control/protection: None  Other Topics Concern   Not on file  Social History Narrative   Lives with mother   Social Determinants of Health   Financial Resource Strain: Low Risk  (01/06/2022)   Overall Financial Resource Strain (CARDIA)    Difficulty of Paying Living Expenses: Not very hard  Food Insecurity: No Food Insecurity (01/06/2022)   Hunger Vital Sign    Worried About Running Out of Food in the Last Year: Never true    Ran Out of Food in the Last Year: Never true  Transportation Needs: No Transportation Needs (01/06/2022)   PRAPARE - Administrator, Civil Service (Medical): No    Lack of Transportation (Non-Medical): No  Physical Activity: Inactive (01/06/2022)   Exercise Vital Sign    Days of Exercise per Week: 0 days    Minutes  of Exercise per Session: 0 min  Stress: Stress Concern Present (01/06/2022)   [employer] of Occupational Health - Occupational Stress Questionnaire    Feeling of Stress : Rather much  Social Connections: Moderately Isolated (01/06/2022)   Social Connection and Isolation Panel [NHANES]    Frequency of Communication with Friends and Family: Once a week    Frequency of Social Gatherings with Friends and Family: Once a week    Attends Religious Services: 1 to 4 times per year    Active Member of [employer] or Organizations: Yes    Attends Banker Meetings: Never    Marital Status: Widowed  Intimate Partner Violence: Not At Risk (01/06/2022)   Humiliation, Afraid, Rape, and Kick questionnaire    Fear of Current or Ex-Partner: No    Emotionally Abused: No    Physically Abused: No    Sexually Abused: No    FAMILY HISTORY: Family History  Problem Relation Age of Onset   Melanoma Father 16   Cirrhosis Maternal Grandfather    Breast cancer Paternal Grandmother     ALLERGIES:  has No Known Allergies.  MEDICATIONS:  Current  Outpatient Medications  Medication Sig Dispense Refill   amoxicillin-clavulanate (AUGMENTIN) 875-125 MG tablet Take 1 tablet by mouth 2 (two) times daily. 10 tablet 0   budesonide-formoterol (SYMBICORT) 160-4.5 MCG/ACT inhaler Inhale 2 puffs into the lungs 2 (two) times daily. 1 each 12   Calcium Carb-Cholecalciferol (OYSTER SHELL CALCIUM W/D) 500-5 MG-MCG TABS Take by mouth. (Patient not taking: Reported on 12/27/2022)     calcium carbonate (TUMS EX) 750 MG chewable tablet Chew 2 tablets by mouth 4 (four) times daily as needed for heartburn. (Patient not taking: Reported on 12/27/2022)     fluticasone (FLONASE) 50 MCG/ACT nasal spray Place 1 spray into both nostrils daily.     guaiFENesin (MUCINEX) 600 MG 12 hr tablet Take 600 mg by mouth 2 (two) times daily. (Patient not taking: Reported on 01/19/2023)     ibuprofen (ADVIL) 200 MG tablet Take 200-400 mg by mouth every 6 (six) hours as needed for headache.     lidocaine-prilocaine (EMLA) cream APPLY TO AFFECTED AREA ONCE 30 g 0   magnesium chloride (SLOW-MAG) 64 MG TBEC SR tablet Take 1 tablet (64 mg total) by mouth 2 (two) times daily. 60 tablet 2   naloxone (NARCAN) nasal spray 4 mg/0.1 mL      omeprazole (PRILOSEC) 40 MG capsule Take 1 capsule (40 mg total) by mouth daily. 90 capsule 0   potassium chloride (MICRO-K) 10 MEQ CR capsule TAKE 1 CAPSULE BY MOUTH ONCE DAILY WITH FUROSEMIDE TO AVOID LOW POTASSIUM OR CRAMPS 30 capsule 0   Tiotropium Bromide Monohydrate (SPIRIVA RESPIMAT) 2.5 MCG/ACT AERS Inhale 2 puffs into the lungs daily. 4 g 11   VENTOLIN HFA 108 (90 Base) MCG/ACT inhaler INHALE 1 PUFF BY MOUTH EVERY 6 HOURS AS NEEDED FOR SHORTNESS OF BREATH FOR WHEEZING (Patient not taking: Reported on 01/19/2023) 18 g 0   Vitamin D, Ergocalciferol, (DRISDOL) 1.25 MG (50000 UNIT) CAPS capsule Take 50,000 Units by mouth once a week.     No current facility-administered medications for this visit.   Facility-Administered Medications Ordered in Other  Visits  Medication Dose Route Frequency Provider Last Rate Last Admin   famotidine (PEPCID) 20-0.9 MG/50ML-% IVPB              PHYSICAL EXAMINATION: ECOG PERFORMANCE STATUS: 1 - Symptomatic but completely ambulatory Vitals:   02/06/23 1418 02/06/23 1423  BP: (!) 154/100 (!) 150/86  Pulse: 100   Temp: 99.2 F (37.3 C)   SpO2: 100%    Filed Weights   02/06/23 1418  Weight: 123 lb 12.8 oz (56.2 kg)    Physical Exam Constitutional:      General: She is not in acute distress. HENT:     Head: Normocephalic and atraumatic.  Eyes:     General: No scleral icterus. Cardiovascular:     Rate and Rhythm: Normal rate.  Pulmonary:     Effort: Pulmonary effort is normal. No respiratory distress.     Comments: Decreased breath sound.  Abdominal:     General: Bowel sounds are normal. There is no distension.     Palpations: Abdomen is soft.  Musculoskeletal:        General: No deformity. Normal range of motion.     Cervical back: Normal range of motion and neck supple.  Skin:    Findings: No erythema or rash.  Neurological:     Mental Status: She is alert and oriented to person, place, and time. Mental status is at baseline.  Psychiatric:     Comments: Anxious      LABORATORY DATA:  I have reviewed the data as listed    Latest Ref Rng & Units 12/29/2022    7:35 AM 10/13/2022    3:01 PM 08/09/2022   10:05 AM  CBC  WBC 4.0 - 10.5 K/uL 5.9  9.5  7.7   Hemoglobin 12.0 - 15.0 g/dL 16.1  09.6  04.5   Hematocrit 36.0 - 46.0 % 41.0  37.9  38.9   Platelets 150 - 400 K/uL 200  225  237       Latest Ref Rng & Units 12/29/2022    7:35 AM 10/13/2022    3:02 PM 08/09/2022   10:06 AM  CMP  Glucose 70 - 99 mg/dL 88  409  811   BUN 8 - 23 mg/dL <5  <5  9   Creatinine 0.44 - 1.00 mg/dL 9.14  7.82  9.56   Sodium 135 - 145 mmol/L 138  129  137   Potassium 3.5 - 5.1 mmol/L 3.2  3.4  3.6   Chloride 98 - 111 mmol/L 101  92  103   CO2 22 - 32 mmol/L 23  25  24    Calcium 8.9 - 10.3 mg/dL 9.1   9.2  9.3   Total Protein 6.5 - 8.1 g/dL  6.9  7.1   Total Bilirubin 0.3 - 1.2 mg/dL  0.8  0.5   Alkaline Phos 38 - 126 U/L  77  69   AST 15 - 41 U/L  24  18   ALT 0 - 44 U/L  15  12         RADIOGRAPHIC STUDIES: I have personally reviewed the radiological images as listed and agreed with the findings in the report. MR Pelvis Wo Contrast  Result Date: 02/01/2023 CLINICAL DATA:  Rectal cancer EXAM: MRI PELVIS WITHOUT CONTRAST TECHNIQUE: Multiplanar multisequence MR imaging of the pelvis was performed. No intravenous contrast was administered. Ultrasound gel was administered per rectum to optimize tumor evaluation. COMPARISON:  PET-CT dated 09/26/2022 FINDINGS: TUMOR LOCATION Tumor distance from Anal Verge/Skin surface: 9.7 cm Tumor distance to Internal Anal sphincter: 7.0 cm TUMOR DESCRIPTION Circumferential extent: Near circumferential Tumor Size and volume: Measuring at least 4.1 cm in length (series 6/image 18) T - CATEGORY Extension through Muscularis Propria: No=T2 Shortest Distance of any tumor/node from Mesorectal  fascia: 9 mm Extramural Vascular Invasion/Tumor Thrombus: No Invasion of Anterior Peritoneal Reflection: No Involvement of Adjacent Organs or Pelvic Sidewall: No Levator Ani Involvement: No N - CATEGORY Mesorectal Lymph Nodes >=4mm: None=N0 Extra-mesorectal Lymphadenopathy: No Other: None. IMPRESSION: 4.1 cm upper/mid rectal mass, as above. Rectal adenocarcinoma T stage: T2. Rectal adenocarcinoma N stage: N0. Distance from tumor to the internal anal sphincter is 7.0 cm. Electronically Signed   By: Charline Bills M.D.   On: 02/01/2023 00:12

## 2023-02-06 NOTE — Progress Notes (Signed)
Patient is having some spasms in her bladder and abdomen. Will the MRI that she just had on 01/16/2023 show if she has a blood clot in her arm pit.

## 2023-02-06 NOTE — Addendum Note (Signed)
Addended by: Rickard Patience on: 02/06/2023 10:41 PM   Modules accepted: Orders

## 2023-02-06 NOTE — Assessment & Plan Note (Signed)
T2N0 disease.  Recommend patient to get surgical evaluation. Refer to colorectal surgeon Dr. Cliffton Asters.

## 2023-02-06 NOTE — Assessment & Plan Note (Addendum)
Abdomen spasm/pain.  I recommend trial of Bentyl, Rx sent to pharmacy. Also recommend her to go to ER for evaluation if pain is severe. She declines.  Will obtain CT abdomen w contrast for further evaluation.

## 2023-02-06 NOTE — Assessment & Plan Note (Signed)
She will get [name] flush Q6-8 weeks

## 2023-02-06 NOTE — Assessment & Plan Note (Signed)
Bronchoscopy biopsy of right upper lobe nodule showed squamous cell carcinoma. Other lung nodule was not able to be biopsied due to location and extract for pneumothorax. Pathology results were reviewed and discussed with patient. Patient has COPD/emphysema, more than 1 suspicious lung nodule, likely not a surgical candidate In addition she also has another competing primary malignancy. follow-up with Dr. Rushie Chestnut for lung SBRT.

## 2023-02-07 ENCOUNTER — Telehealth: Payer: Self-pay

## 2023-02-07 NOTE — Telephone Encounter (Signed)
Referral to surgery faxed to [facility] surgery - Dr. Cliffton Asters:   Re: rectal cancer   Fax: [phone] Ph: [phone]  Fax confirmation received.

## 2023-02-07 NOTE — Telephone Encounter (Signed)
Please schedule CT (routine) and inform pt of appt details.

## 2023-02-07 NOTE — Addendum Note (Signed)
Addended by: Coralee Rud on: 02/07/2023 08:36 AM   Modules accepted: Orders

## 2023-02-14 ENCOUNTER — Ambulatory Visit: Payer: Medicaid Other

## 2023-02-14 ENCOUNTER — Ambulatory Visit: Admission: RE | Admit: 2023-02-14 | Payer: Medicaid Other | Source: Ambulatory Visit

## 2023-02-16 ENCOUNTER — Other Ambulatory Visit: Payer: Self-pay

## 2023-02-16 ENCOUNTER — Ambulatory Visit
Admission: RE | Admit: 2023-02-16 | Discharge: 2023-02-16 | Disposition: A | Discharge status: Home / Self Care | Payer: Medicaid Other | Source: Ambulatory Visit | Attending: Radiation Oncology | Admitting: Radiation Oncology

## 2023-02-16 DIAGNOSIS — Z510 Encounter for antineoplastic radiation therapy: Secondary | ICD-10-CM | POA: Insufficient documentation

## 2023-02-16 DIAGNOSIS — F17210 Nicotine dependence, cigarettes, uncomplicated: Secondary | ICD-10-CM | POA: Insufficient documentation

## 2023-02-16 DIAGNOSIS — C3411 Malignant neoplasm of upper lobe, right bronchus or lung: Secondary | ICD-10-CM | POA: Insufficient documentation

## 2023-02-16 DIAGNOSIS — Z85048 Personal history of other malignant neoplasm of rectum, rectosigmoid junction, and anus: Secondary | ICD-10-CM | POA: Insufficient documentation

## 2023-02-16 DIAGNOSIS — Z853 Personal history of malignant neoplasm of breast: Secondary | ICD-10-CM | POA: Insufficient documentation

## 2023-02-16 LAB — RAD ONC ARIA SESSION SUMMARY
Course Elapsed Days: 0
Plan Fractions Treated to Date: 1
Plan Prescribed Dose Per Fraction: 10 Gy
Plan Total Fractions Prescribed: 5
Plan Total Prescribed Dose: 50 Gy
Reference Point Dosage Given to Date: 10 Gy
Reference Point Session Dosage Given: 10 Gy
Session Number: 1

## 2023-02-21 ENCOUNTER — Other Ambulatory Visit: Payer: Self-pay

## 2023-02-21 ENCOUNTER — Ambulatory Visit
Admission: RE | Admit: 2023-02-21 | Discharge: 2023-02-21 | Disposition: A | Discharge status: Home / Self Care | Payer: Medicaid Other | Source: Ambulatory Visit | Attending: Radiation Oncology | Admitting: Radiation Oncology

## 2023-02-21 DIAGNOSIS — Z510 Encounter for antineoplastic radiation therapy: Secondary | ICD-10-CM | POA: Diagnosis not present

## 2023-02-21 LAB — RAD ONC ARIA SESSION SUMMARY
Course Elapsed Days: 5
Plan Fractions Treated to Date: 2
Plan Prescribed Dose Per Fraction: 10 Gy
Plan Total Fractions Prescribed: 5
Plan Total Prescribed Dose: 50 Gy
Reference Point Dosage Given to Date: 20 Gy
Reference Point Session Dosage Given: 10 Gy
Session Number: 2

## 2023-02-22 ENCOUNTER — Other Ambulatory Visit: Payer: Self-pay | Admitting: Cardiology

## 2023-02-22 ENCOUNTER — Other Ambulatory Visit: Payer: Self-pay | Admitting: Family

## 2023-02-23 ENCOUNTER — Ambulatory Visit
Admission: RE | Admit: 2023-02-23 | Discharge: 2023-02-23 | Disposition: A | Discharge status: Home / Self Care | Payer: Medicaid Other | Source: Ambulatory Visit | Attending: Radiation Oncology | Admitting: Radiation Oncology

## 2023-02-23 ENCOUNTER — Other Ambulatory Visit: Payer: Self-pay

## 2023-02-23 DIAGNOSIS — Z510 Encounter for antineoplastic radiation therapy: Secondary | ICD-10-CM | POA: Diagnosis not present

## 2023-02-23 LAB — RAD ONC ARIA SESSION SUMMARY
Course Elapsed Days: 7
Plan Fractions Treated to Date: 3
Plan Prescribed Dose Per Fraction: 10 Gy
Plan Total Fractions Prescribed: 5
Plan Total Prescribed Dose: 50 Gy
Reference Point Dosage Given to Date: 30 Gy
Reference Point Session Dosage Given: 10 Gy
Session Number: 3

## 2023-02-27 ENCOUNTER — Telehealth: Payer: Self-pay

## 2023-02-27 NOTE — Telephone Encounter (Signed)
Wants prednisone Rx to make her feel better from her radiation. Please advise

## 2023-02-28 ENCOUNTER — Other Ambulatory Visit: Payer: Self-pay | Admitting: Cardiology

## 2023-02-28 ENCOUNTER — Ambulatory Visit: Payer: Medicaid Other

## 2023-02-28 DIAGNOSIS — R0602 Shortness of breath: Secondary | ICD-10-CM

## 2023-03-01 ENCOUNTER — Telehealth: Payer: Self-pay | Admitting: Cardiology

## 2023-03-01 MED ORDER — PREDNISONE 20 MG PO TABS: 20.0000 mg | ORAL_TABLET | Freq: Every day | ORAL | 0 refills | Status: AC

## 2023-03-01 NOTE — Telephone Encounter (Signed)
Patient left VM stating she lost her inhaler yesterday and needs an Rx sent to the pharmacy for a new one.   Called and left VM for patient to call back and let me know which inhaler it is because there are 2 on her med list, Symbicort and albuterol.

## 2023-03-02 ENCOUNTER — Ambulatory Visit
Admission: RE | Admit: 2023-03-02 | Discharge: 2023-03-02 | Disposition: A | Discharge status: Home / Self Care | Payer: Medicaid Other | Source: Ambulatory Visit | Attending: Radiation Oncology | Admitting: Radiation Oncology

## 2023-03-02 ENCOUNTER — Ambulatory Visit: Payer: Medicaid Other

## 2023-03-02 ENCOUNTER — Other Ambulatory Visit: Payer: Self-pay

## 2023-03-02 DIAGNOSIS — Z510 Encounter for antineoplastic radiation therapy: Secondary | ICD-10-CM | POA: Diagnosis not present

## 2023-03-02 LAB — RAD ONC ARIA SESSION SUMMARY
Course Elapsed Days: 14
Plan Fractions Treated to Date: 4
Plan Prescribed Dose Per Fraction: 10 Gy
Plan Total Fractions Prescribed: 5
Plan Total Prescribed Dose: 50 Gy
Reference Point Dosage Given to Date: 40 Gy
Reference Point Session Dosage Given: 10 Gy
Session Number: 4

## 2023-03-03 IMAGING — CT CT CHEST-ABD-PELV W/ CM
3 of 5 series · 14 of 36 positions shown, 16 images · IV contrast (omnipaque)
Comparison: October 16, 2018.

CLINICAL DATA: Triple negative breast cancer in a 60-year-old fat
select fat female, recent diagnosis.

EXAM:
CT CHEST, ABDOMEN, AND PELVIS WITH CONTRAST
TECHNIQUE: Multidetector CT imaging of the chest, abdomen and pelvis was
performed following the standard protocol during bolus
administration of intravenous contrast.
CONTRAST:  80mL OMNIPAQUE IOHEXOL 300 MG/ML  SOLN

[Series 2: cap with · axial · 0.66mm/px · z∈[-962,-477]mm · 9 of 123 slices shown, 11 images]
[im 13/123  mediastinal]
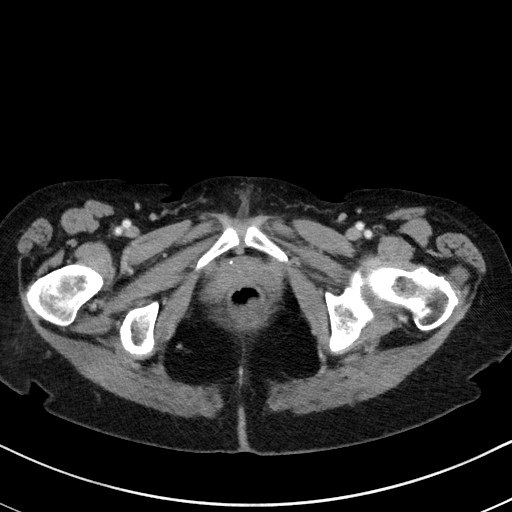
[im 13/123  bone]
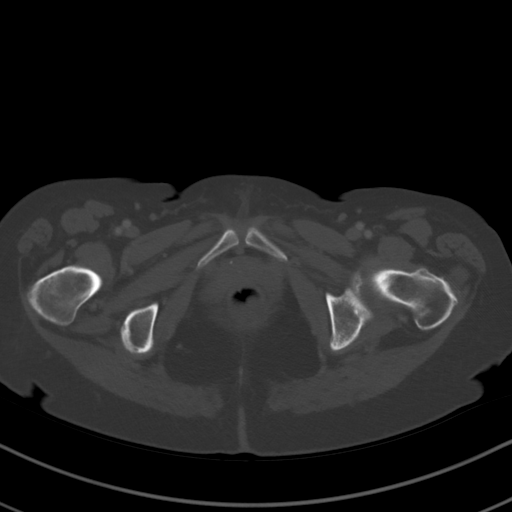
[im 25/123  mediastinal]
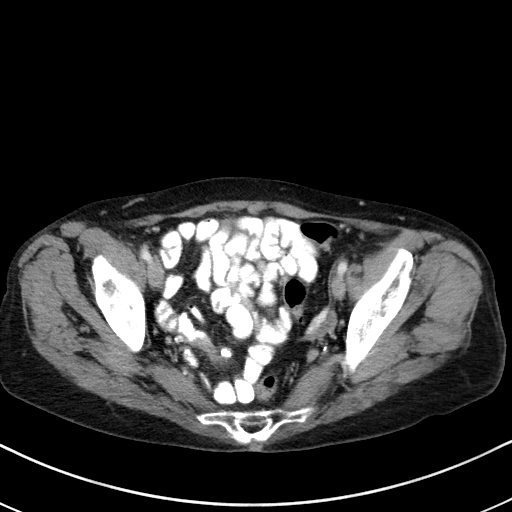
[im 37/123  mediastinal]
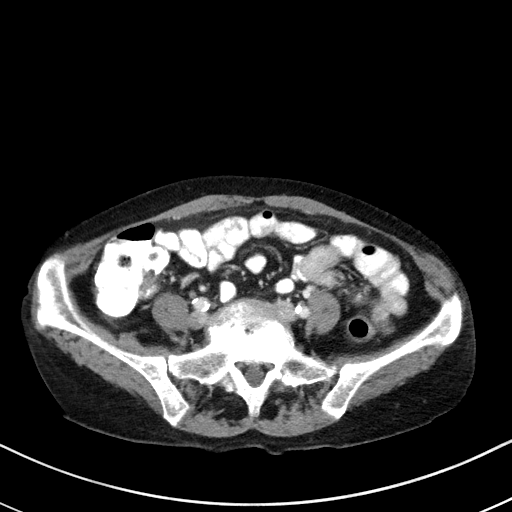
[im 49/123  mediastinal]
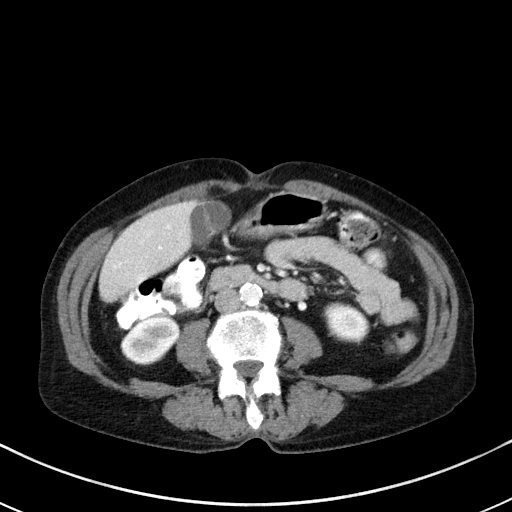
[im 62/123  mediastinal]
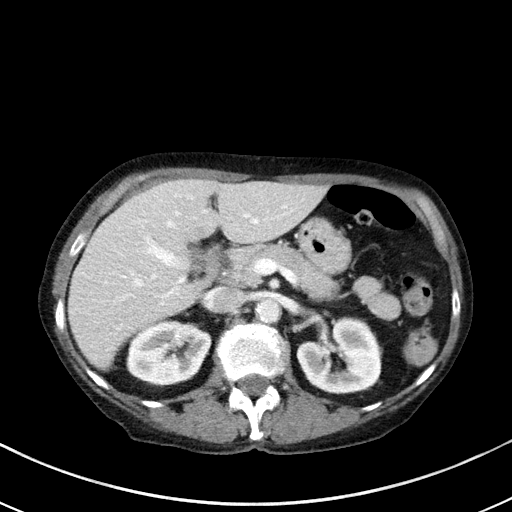
[im 74/123  mediastinal]
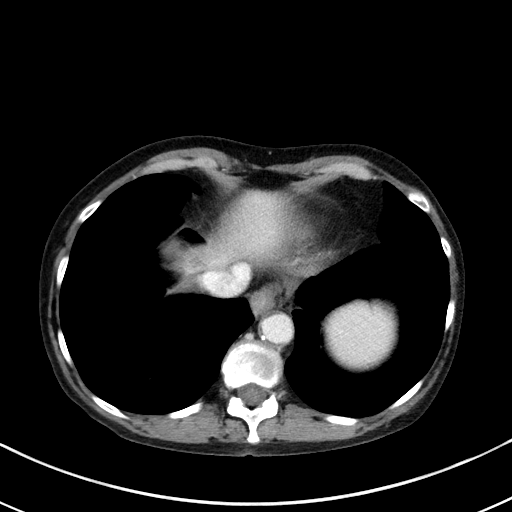
[im 86/123  mediastinal]
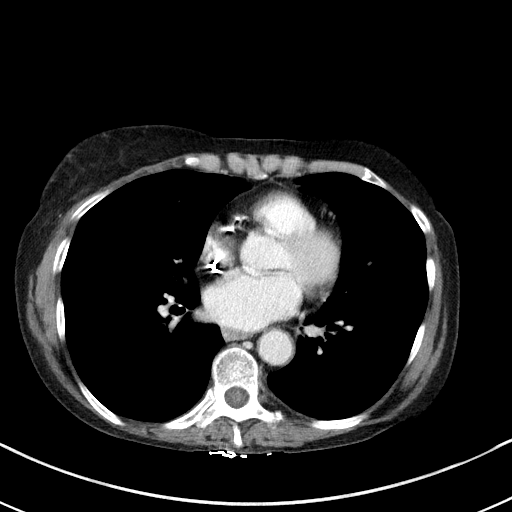
[im 98/123  mediastinal]
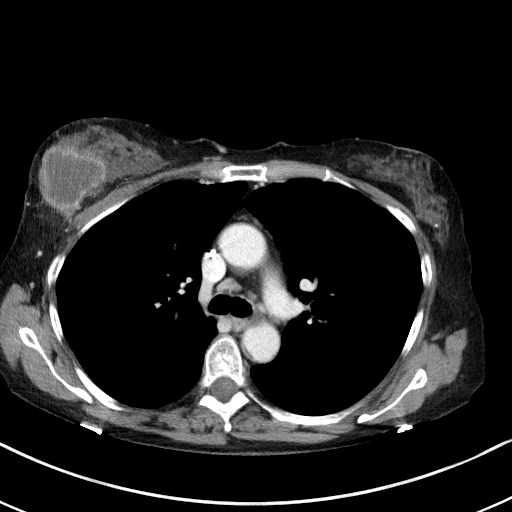
[im 110/123  mediastinal]
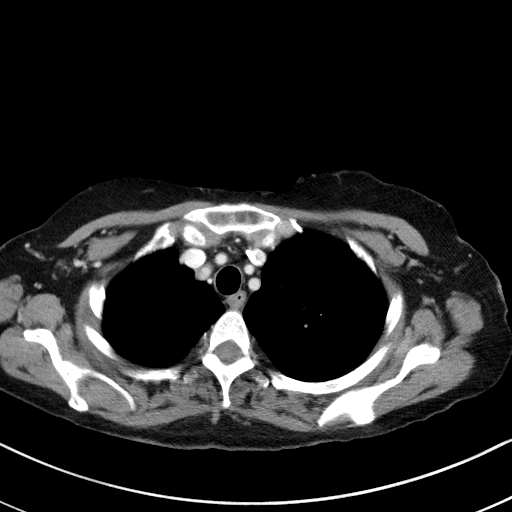
[im 110/123  bone]
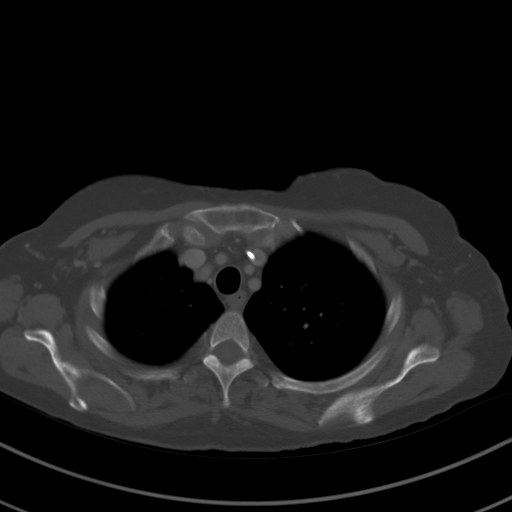

[Series 4: lung · axial · 0.66mm/px · z∈[-704,-656]mm · 2 of 159 slices shown]
[im 13/159  bone]
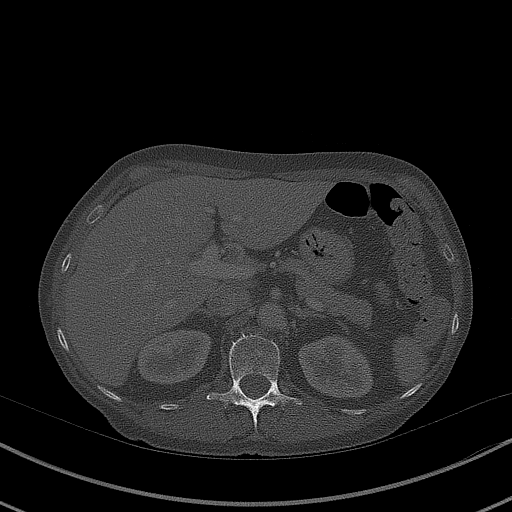
[im 37/159  bone]
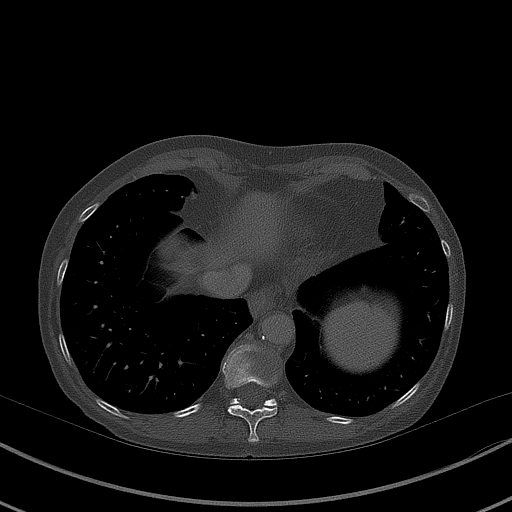

[Series 5: coronals · coronal · 0.82mm/px · 3 of 118 slices shown]
[im 24/118  mediastinal]
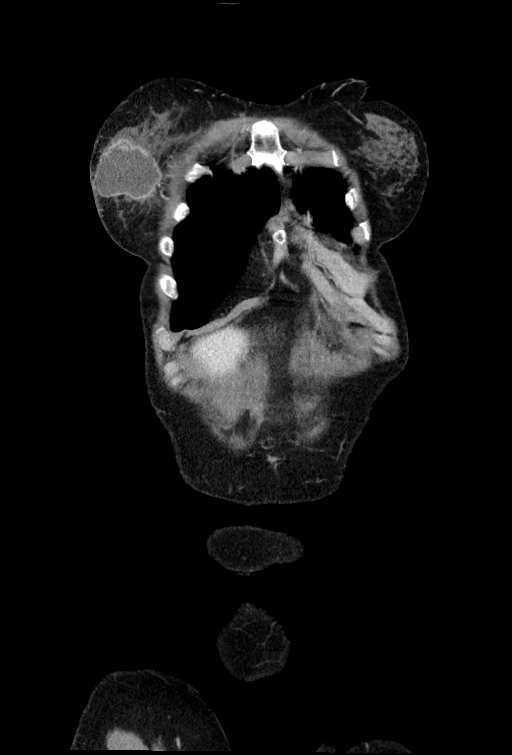
[im 47/118  mediastinal]
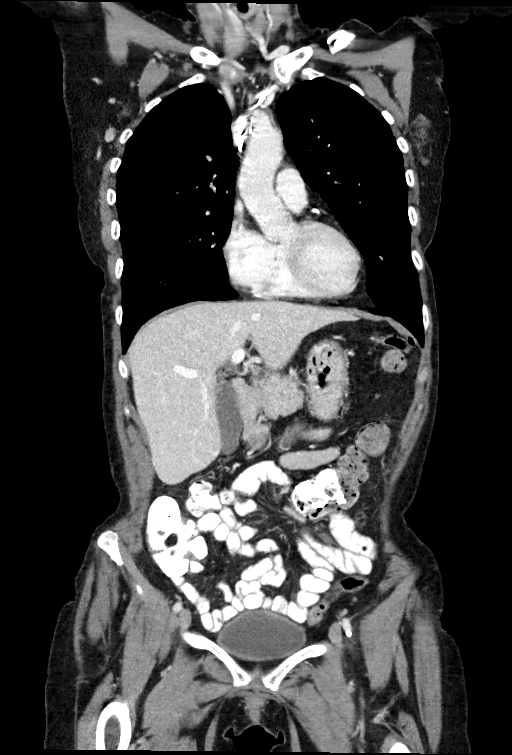
[im 71/118  mediastinal]
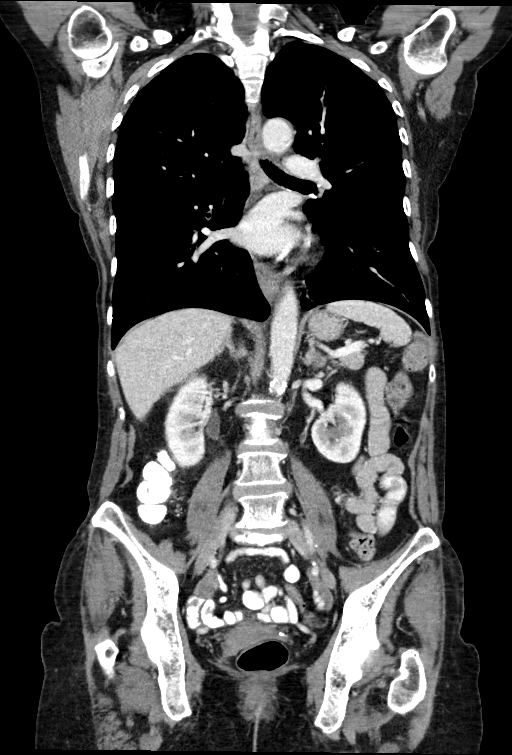

[14 of 36 positions shown; findings below may reference images not displayed]

FINDINGS: CT CHEST FINDINGS

Cardiovascular: Calcified atheromatous plaque and noncalcified
plaque in the thoracic aorta. No aneurysm. Normal heart size. No
substantial pericardial effusion. Normal caliber of the central
pulmonary vasculature. LEFT-sided Port-A-Cath terminates at the
caval to atrial junction.

Mediastinum/Nodes: No internal mammary adenopathy.

No mediastinal adenopathy.  Esophagus grossly normal.

Small RIGHT axillary lymph nodes measuring between 6 and 7 mm are
nonspecific, seen in the context of an irregular peripherally
enhancing mass in the RIGHT breast measuring 5.5 x 4.8 cm.

Lungs/Pleura: Tiny RIGHT basilar pulmonary nodule just above the
RIGHT hemidiaphragm (image 133/4) 3 mm.

Tiny RIGHT lower lobe nodule (image 94/4) potentially with
calcification at 1-2 mm.

Small RIGHT upper lobe nodule (image 40/4) 3 mm. Mild basilar
atelectasis. No effusion. No consolidation. Airways are patent.

Musculoskeletal: See below for full musculoskeletal details. RIGHT
breast mass as discussed above.

CT ABDOMEN PELVIS FINDINGS

Hepatobiliary: No focal, suspicious hepatic lesion. No
pericholecystic stranding. No biliary duct dilation. Portal vein is
patent.

Pancreas: Normal, without mass, inflammation or ductal dilatation.

Spleen: Spleen normal size and contour without focal lesion.

Adrenals/Urinary Tract:

Adrenal glands are unremarkable. Symmetric renal enhancement. No
sign of hydronephrosis. No suspicious renal lesion or perinephric
stranding.

Urinary bladder is grossly unremarkable.

Stomach/Bowel: No acute gastrointestinal finding.  Normal appendix.

Vascular/Lymphatic:

Aortic atherosclerosis. No sign of aneurysm. Smooth contour of the
IVC. There is no gastrohepatic or hepatoduodenal ligament
lymphadenopathy. No retroperitoneal or mesenteric lymphadenopathy.

No pelvic sidewall lymphadenopathy.

Atherosclerotic changes are moderate to marked with calcified and
noncalcified plaque in the abdominal aorta.

Reproductive: Unremarkable by CT.  No adnexal mass.

Other: No ascites.  No free air.

Musculoskeletal: No acute bone finding. No destructive bone process.
Spinal degenerative changes.
IMPRESSION: Large RIGHT breast mass with small RIGHT axillary lymph nodes with
variable degrees of enhancement potentially involved but nonspecific
based on size. These track towards retropectoral nodal stations
largest lymph node along the lateral margin of the pectoralis major.

Tiny pulmonary nodules in the chest as discussed. These are
nonspecific but would consider short interval follow-up to assess
for any changes.

No evidence of metastatic disease involving the abdomen or pelvis.

Aortic atherosclerosis.

Aortic Atherosclerosis ([id number]).

## 2023-03-06 ENCOUNTER — Ambulatory Visit
Admission: RE | Admit: 2023-03-06 | Discharge: 2023-03-06 | Disposition: A | Discharge status: Home / Self Care | Payer: Medicaid Other | Source: Ambulatory Visit | Attending: Radiation Oncology | Admitting: Radiation Oncology

## 2023-03-06 ENCOUNTER — Other Ambulatory Visit: Payer: Self-pay

## 2023-03-06 DIAGNOSIS — Z510 Encounter for antineoplastic radiation therapy: Secondary | ICD-10-CM | POA: Diagnosis not present

## 2023-03-06 LAB — RAD ONC ARIA SESSION SUMMARY
Course Elapsed Days: 18
Plan Fractions Treated to Date: 5
Plan Prescribed Dose Per Fraction: 10 Gy
Plan Total Fractions Prescribed: 5
Plan Total Prescribed Dose: 50 Gy
Reference Point Dosage Given to Date: 50 Gy
Reference Point Session Dosage Given: 10 Gy
Session Number: 5

## 2023-03-07 NOTE — Radiation Completion Notes (Signed)
Patient Name: Yvonne Scott, Yvonne Scott MRN: [id number] Date of Birth: [birth year] Referring Physician: Marisue Ivan, M.D. Date of Service: 2023-03-07 Radiation Oncologist: [name], M.D. [facility] [place]                             RADIATION ONCOLOGY END OF TREATMENT NOTE     Diagnosis: C34.11 Malignant neoplasm of upper lobe, right bronchus or lung Staging on 2021-10-19: Invasive ductal carcinoma of right breast (HCC) T=pT2, N=pN0, M=cM0 Staging on 2023-01-10: Squamous cell lung cancer, right (HCC) T=cT1, N=cN0, M=cM0 Staging on 2023-01-10: Rectal adenocarcinoma (HCC) T=cT2, N=cN0, M=cM0 Intent: Curative     HPI: Patient is a 62 year old female seen today for possible treatment with SBRT to her right lung squamous cell carcinoma.  She also has a history of invasive ductal carcinoma right breast treated with I believe neoadjuvant chemotherapy followed by mastectomy for a pT2 N0 M0 grade 3 triple negative disease.  She also has a clinical stage I adenocarcinoma of the right that they are in the midst of possibly planning surgical resection.  She has 3 solid bilateral pulmonary nodules since 324 the largest being a 0.9 cm in the anterior medial left upper lobe highly suspicious for pulmonary metastasis.  No thoracic adenopathy was noted.  PET scan showed hypermetabolic bilateral pulmonary nodules consistent with metastasis no other evidence of avid metastatic disease.  She had bronchoscopy showing squamous cell carcinoma the right upper lobe lesion.  We presented her case at tumor conference and recommendation was made for SBRT to this lesion.  She is clinically doing well specifically [place] cough hemoptysis or chest tightness.      ==========DELIVERED PLANS==========  First Treatment Date: 2023-02-16 Last Treatment Date: 2023-03-06   Plan Name: Lung_[id number] Site: Lung, Right Technique: SBRT/SRT-IMRT Mode: Photon Dose Per Fraction: 10 Gy Prescribed Dose (Delivered /  Prescribed): 50 Gy / 50 Gy Prescribed Fxs (Delivered / Prescribed): 5 / 5     ==========ON TREATMENT VISIT DATES========== 2023-02-16, 2023-02-21, 2023-02-23, 2023-02-23, 2023-03-02, 2023-03-06     ==========UPCOMING VISITS==========       ==========APPENDIX - ON TREATMENT VISIT NOTES==========   See weekly On Treatment Notes in Epic for details in the Media tab (listed as Progress notes on the On Treatment Visit Dates listed above).

## 2023-03-13 ENCOUNTER — Encounter: Payer: Self-pay | Admitting: Cardiology

## 2023-03-13 ENCOUNTER — Ambulatory Visit: Payer: Medicaid Other | Admitting: Cardiology

## 2023-03-13 VITALS — BP 146/92 | HR 88 | Ht 62.0 in | Wt 124.4 lb

## 2023-03-13 DIAGNOSIS — I10 Essential (primary) hypertension: Secondary | ICD-10-CM | POA: Diagnosis not present

## 2023-03-13 DIAGNOSIS — E782 Mixed hyperlipidemia: Secondary | ICD-10-CM | POA: Diagnosis not present

## 2023-03-13 MED ORDER — POTASSIUM CHLORIDE ER 10 MEQ PO CPCR: 10.0000 meq | ORAL_CAPSULE | Freq: Every day | ORAL | 1 refills | Status: DC

## 2023-03-13 MED ORDER — VENTOLIN HFA 108 (90 BASE) MCG/ACT IN AERS: 2.0000 | INHALATION_SPRAY | Freq: Four times a day (QID) | RESPIRATORY_TRACT | 6 refills | Status: DC | PRN

## 2023-03-13 NOTE — Progress Notes (Signed)
Established Patient Office Visit  Subjective:  Patient ID: Yvonne Scott, female    DOB: 1960-05-17  Age: 62 y.o. MRN: [id number]  Chief Complaint  Patient presents with   Follow-up    3 month follow up    Patient in office for 3 month follow up. Patient did not having fasting lab work done. Patient has no new complaints today. Patient undergoing chemo and radiation for breast, lung and rectal cancer. Patient anticipating surgery for rectal cancer.  Patient continues to smoke, states she has been slowly cutting back.  Return for fasting lab work.     No other concerns at this time.   Past Medical History:  Diagnosis Date   Acute kidney injury (HCC)    Anxiety    Aortic atherosclerosis (HCC) 10/24/2016   Breast cancer (HCC)    Chemotherapy-induced neuropathy (HCC)    Essential hypertension    GERD (gastroesophageal reflux disease)    Hypokalemia    Hypomagnesemia    Invasive ductal carcinoma of right breast (HCC)    Metabolic acidosis    Personal history of chemotherapy    Pneumonia    Severe protein-calorie malnutrition (HCC)    Thrombocytopenia (HCC)     Past Surgical History:  Procedure Laterality Date   BREAST BIOPSY Right 2022   BRONCHIAL BIOPSY  12/29/2022   Procedure: BRONCHIAL BIOPSIES;  Surgeon: Raechel Chute, MD;  Location: [facility];  Service: Pulmonary;;   BRONCHIAL NEEDLE ASPIRATION BIOPSY  12/29/2022   Procedure: BRONCHIAL NEEDLE ASPIRATION BIOPSIES;  Surgeon: Raechel Chute, MD;  Location: [facility];  Service: Pulmonary;;   COLONOSCOPY WITH PROPOFOL N/A 12/07/2022   Procedure: COLONOSCOPY WITH PROPOFOL;  Surgeon: Toney Reil, MD;  Location: [facility]COPY;  Service: Gastroenterology;  Laterality: N/A;   FOOT SURGERY Left    little toe correction   MASTECTOMY W/ SENTINEL NODE BIOPSY Right 10/19/2021   Procedure: MASTECTOMY WITH SENTINEL LYMPH NODE BIOPSY ( modified radical);  Surgeon: Carolan Shiver, MD;  Location: [facility] ORS;   Service: General;  Laterality: Right;   POLYPECTOMY  12/07/2022   Procedure: POLYPECTOMY;  Surgeon: Toney Reil, MD;  Location: [facility];  Service: Gastroenterology;;   PORTA CATH INSERTION  01/19/2021   SUBMUCOSAL TATTOO INJECTION  12/07/2022   Procedure: SUBMUCOSAL TATTOO INJECTION;  Surgeon: Toney Reil, MD;  Location: [facility];  Service: Gastroenterology;;   VIDEO BRONCHOSCOPY WITH ENDOBRONCHIAL ULTRASOUND N/A 12/29/2022   Procedure: VIDEO BRONCHOSCOPY WITH ENDOBRONCHIAL ULTRASOUND;  Surgeon: Raechel Chute, MD;  Location: [facility];  Service: Pulmonary;  Laterality: N/A;    Social History   Socioeconomic History   Marital status: Widowed    Spouse name: Not on file   Number of children: 1   Years of education: Not on file   Highest education level: Not on file  Occupational History   Not on file  Tobacco Use   Smoking status: Every Day    Current packs/day: 1.00    Average packs/day: 1 pack/day for 44.0 years (44.0 ttl pk-yrs)    Types: Cigarettes    Start date: 03/15/1979   Smokeless tobacco: Never  Vaping Use   Vaping status: Never Used  Substance and Sexual Activity   Alcohol use: Not Currently    Alcohol/week: 3.0 standard drinks of alcohol    Types: 3 Standard drinks or equivalent per week   Drug use: Not Currently   Sexual activity: Not Currently    Birth control/protection: None  Other Topics Concern   Not on file  Social History Narrative   Lives with mother   Social Drivers of Health   Financial Resource Strain: Low Risk  (01/06/2022)   Overall Financial Resource Strain (CARDIA)    Difficulty of Paying Living Expenses: Not very hard  Food Insecurity: No Food Insecurity (01/06/2022)   Hunger Vital Sign    Worried About Running Out of Food in the Last Year: Never true    Ran Out of Food in the Last Year: Never true  Transportation Needs: No Transportation Needs (01/06/2022)   PRAPARE - Administrator, Civil Service  (Medical): No    Lack of Transportation (Non-Medical): No  Physical Activity: Inactive (01/06/2022)   Exercise Vital Sign    Days of Exercise per Week: 0 days    Minutes of Exercise per Session: 0 min  Stress: Stress Concern Present (01/06/2022)   [employer] of Occupational Health - Occupational Stress Questionnaire    Feeling of Stress : Rather much  Social Connections: Moderately Isolated (01/06/2022)   Social Connection and Isolation Panel [NHANES]    Frequency of Communication with Friends and Family: Once a week    Frequency of Social Gatherings with Friends and Family: Once a week    Attends Religious Services: 1 to 4 times per year    Active Member of [employer] or Organizations: Yes    Attends Banker Meetings: Never    Marital Status: Widowed  Intimate Partner Violence: Not At Risk (01/06/2022)   Humiliation, Afraid, Rape, and Kick questionnaire    Fear of Current or Ex-Partner: No    Emotionally Abused: No    Physically Abused: No    Sexually Abused: No    Family History  Problem Relation Age of Onset   Melanoma Father 58   Cirrhosis Maternal Grandfather    Breast cancer Paternal Grandmother     No Known Allergies  Outpatient Medications Prior to Visit  Medication Sig   budesonide-formoterol (SYMBICORT) 160-4.5 MCG/ACT inhaler Inhale 2 puffs into the lungs 2 (two) times daily.   Calcium Carb-Cholecalciferol (OYSTER SHELL CALCIUM W/D) 500-5 MG-MCG TABS Take by mouth.   calcium carbonate (TUMS EX) 750 MG chewable tablet Chew 2 tablets by mouth 4 (four) times daily as needed for heartburn.   dicyclomine (BENTYL) 10 MG capsule Take 1 capsule (10 mg total) by mouth 4 (four) times daily -  before meals and at bedtime.   fluticasone (FLONASE) 50 MCG/ACT nasal spray Place 1 spray into both nostrils daily.   ibuprofen (ADVIL) 200 MG tablet Take 200-400 mg by mouth every 6 (six) hours as needed for headache.   lidocaine-prilocaine (EMLA) cream APPLY TO  AFFECTED AREA ONCE   magnesium chloride (SLOW-MAG) 64 MG TBEC SR tablet Take 1 tablet (64 mg total) by mouth 2 (two) times daily.   naloxone (NARCAN) nasal spray 4 mg/0.1 mL    omeprazole (PRILOSEC) 40 MG capsule Take 1 capsule (40 mg total) by mouth daily.   Tiotropium Bromide Monohydrate (SPIRIVA RESPIMAT) 2.5 MCG/ACT AERS Inhale 2 puffs into the lungs daily.   Vitamin D, Ergocalciferol, (DRISDOL) 1.25 MG (50000 UNIT) CAPS capsule Take 50,000 Units by mouth once a week.   [DISCONTINUED] potassium chloride (MICRO-K) 10 MEQ CR capsule TAKE 1 CAPSULE BY MOUTH ONCE DAILY WITH FUROSEMIDE TO AVOID LOW POTASSIUM OR CRAMPS   [DISCONTINUED] VENTOLIN HFA 108 (90 Base) MCG/ACT inhaler INHALE 1 PUFF BY MOUTH EVERY 6 HOURS AS NEEDED FOR SHORTNESS OF BREATH FOR WHEEZING   [DISCONTINUED] amoxicillin-clavulanate (AUGMENTIN) 875-125 MG  tablet Take 1 tablet by mouth 2 (two) times daily. (Patient not taking: Reported on 03/13/2023)   [DISCONTINUED] guaiFENesin (MUCINEX) 600 MG 12 hr tablet Take 600 mg by mouth 2 (two) times daily. (Patient not taking: Reported on 03/13/2023)   Facility-Administered Medications Prior to Visit  Medication Dose Route Frequency Provider   famotidine (PEPCID) 20-0.9 MG/50ML-% IVPB         Review of Systems  Constitutional: Negative.   HENT: Negative.    Eyes: Negative.   Respiratory: Negative.  Negative for shortness of breath.   Cardiovascular: Negative.  Negative for chest pain.  Gastrointestinal: Negative.  Negative for abdominal pain, constipation and diarrhea.  Genitourinary: Negative.   Musculoskeletal:  Negative for joint pain and myalgias.  Skin: Negative.   Neurological: Negative.  Negative for dizziness and headaches.  Endo/Heme/Allergies: Negative.   All other systems reviewed and are negative.      Objective:   BP (!) 146/92   Pulse 88   Ht 5\' 2"  (1.575 m)   Wt 124 lb 6.4 oz (56.4 kg)   LMP 11/19/2002 (Approximate)   SpO2 94%   BMI 22.75 kg/m    Vitals:   03/13/23 1344  BP: (!) 146/92  Pulse: 88  Height: 5\' 2"  (1.575 m)  Weight: 124 lb 6.4 oz (56.4 kg)  SpO2: 94%  BMI (Calculated): 22.75    Physical Exam Vitals and nursing note reviewed.  Constitutional:      Appearance: Normal appearance. She is normal weight.  HENT:     Head: Normocephalic and atraumatic.     Nose: Nose normal.     Mouth/Throat:     Mouth: Mucous membranes are moist.  Eyes:     Extraocular Movements: Extraocular movements intact.     Conjunctiva/sclera: Conjunctivae normal.     Pupils: Pupils are equal, round, and reactive to light.  Cardiovascular:     Rate and Rhythm: Normal rate and regular rhythm.     Pulses: Normal pulses.     Heart sounds: Normal heart sounds.  Pulmonary:     Effort: Pulmonary effort is normal.     Breath sounds: Normal breath sounds.  Abdominal:     General: Abdomen is flat. Bowel sounds are normal.     Palpations: Abdomen is soft.  Musculoskeletal:        General: Normal range of motion.     Cervical back: Normal range of motion.  Skin:    General: Skin is warm and dry.  Neurological:     General: No focal deficit present.     Mental Status: She is alert and oriented to person, place, and time.  Psychiatric:        Mood and Affect: Mood normal.        Behavior: Behavior normal.        Thought Content: Thought content normal.        Judgment: Judgment normal.      No results found for any visits on 03/13/23.  Recent Results (from the past 2160 hours)  Cytology - Non PAP;     Status: None   Collection Time: 12/29/22 12:00 AM  Result Value Ref Range   CYTOLOGY - NON GYN      Cytology - Non PAP CASE: [id number] PATIENT: Juluis Pitch Non-Gynecological Cytology Report     Clinical History: Lung nodule; Hx breast ca    FINAL MICROSCOPIC DIAGNOSIS:  A. LUNG, RUL TARGET #1, FINE NEEDLE ASPIRATION: - Squamous cell carcinoma, see comment  COMMENT:  Immunostains show that the tumor  cells are positive for CK5/6 while they are negative for p63, TTF-1, GATA3 and ER consistent with above interpretation.  Dr. Kenyon Ana reviewed the case and concurs with the above diagnosis. Dr. Aundria Rud was paged on 01/04/2023.   SPECIMEN ADEQUACY: A. Satisfactory for Evaluation   GROSS: Received is/are (A)(1)2 Slides (1 Quick stain). (2)2 Slides (1 Quick stain). Also received are 15ccs of cloudy red saline solution from needle rinses. Biopsy / tissue pieces were included for cell block.(MA:GW:gw) Smears: (A)4 Concentration Method (Thin Prep): (A)1 Cell Block: (A)1 Conventional Additional Studies: N/A     Final Diagnosis performed by  Holley Bouche, MD.   Electronically signed 01/03/2023 Technical component performed at [facility]. [facility], [address], [place], [place] [address].  Professional component performed at [facility], [address], [place], [place] [address].  Immunohistochemistry Technical component (if applicable) was performed at [facility]. [address], [place], [place] [address].   IMMUNOHISTOCHEMISTRY DISCLAIMER (if applicable): Some of these immunohistochemical stains may have been developed and the performance characteristics determine by [facility]. Some may not have been cleared or approved by the U.S. Food and Drug Administration. The FDA has determined that such clearance or approval is not necessary. This test is used for clinical purposes. It should not be regarded as investigational or for research. This laboratory is certified under the Clinical Laboratory Improvement Amendments of 1988 (CL IA-88) as qualified to perform high complexity clinical laboratory testing.  The controls stained appropriately.   IHC stains are performed on formalin fixed, paraffin embedded tissue using a 3,3"diaminobenzidine (DAB) chromogen and Leica Bond Autostainer System. The  staining intensity of the nucleus is score manually and is reported as the percentage of tumor cell nuclei demonstrating specific nuclear staining. The specimens are fixed in 10% Neutral Formalin for at least 6 hours and up to 72hrs. These tests are validated on decalcified tissue. Results should be interpreted with caution given the possibility of false negative results on decalcified specimens. Antibody Clones are as follows ER-clone 28F, PR-clone 16, Ki67- clone MM1. Some of these immunohistochemical stains may have been developed and the performance characteristics determined by [facility].   CBC per protocol     Status: None   Collection Time: 12/29/22  7:35 AM  Result Value Ref Range   WBC 5.9 4.0 - 10.5 K/uL   RBC 4.35 3.87 - 5.11 MIL/uL   Hemoglobin 14.1 12.0 - 15.0 g/dL   HCT 95.2 84.1 - 32.4 %   MCV 94.3 80.0 - 100.0 fL   MCH 32.4 26.0 - 34.0 pg   MCHC 34.4 30.0 - 36.0 g/dL   RDW 40.1 02.7 - 25.3 %   Platelets 200 150 - 400 K/uL   nRBC 0.0 0.0 - 0.2 %    Comment: Performed at [facility], [address], [place], [place] [address]  Basic metabolic panel per protocol     Status: Abnormal   Collection Time: 12/29/22  7:35 AM  Result Value Ref Range   Sodium 138 135 - 145 mmol/L   Potassium 3.2 (L) 3.5 - 5.1 mmol/L   Chloride 101 98 - 111 mmol/L   CO2 23 22 - 32 mmol/L   Glucose, Bld 88 70 - 99 mg/dL    Comment: Glucose reference range applies only to samples taken after fasting for at least 8 hours.   BUN <5 (L) 8 - 23 mg/dL  Creatinine, Ser 0.68 0.44 - 1.00 mg/dL   Calcium 9.1 8.9 - 91.4 mg/dL   GFR, Estimated >78 >29 mL/min    Comment: (NOTE) Calculated using the CKD-EPI Creatinine Equation (2021)    Anion gap 14 5 - 15    Comment: Performed at [facility], [address], [place], [place] [address]  Pulmonary Function Test [facility] Only     Status: None   Collection Time: 01/10/23 12:52 PM  Result Value Ref Range   FVC-Pre 2.03 L    FVC-%Pred-Pre 67 %   FVC-Post 2.16 L   FVC-%Pred-Post 71 %   FVC-%Change-Post 6 %   FEV1-Pre 1.36 L   FEV1-%Pred-Pre 58 %   FEV1-Post 1.50 L   FEV1-%Pred-Post 64 %   FEV1-%Change-Post 10 %   FEV6-Pre 2.03 L   FEV6-%Pred-Pre 70 %   FEV6-Post 2.16 L   FEV6-%Pred-Post 74 %   FEV6-%Change-Post 6 %   Pre FEV1/FVC ratio 67 %   FEV1FVC-%Pred-Pre 86 %   Post FEV1/FVC ratio 69 %   FEV1FVC-%Change-Post 3 %   Pre FEV6/FVC Ratio 100 %   FEV6FVC-%Pred-Pre 103 %   Post FEV6/FVC ratio 100 %   FEV6FVC-%Pred-Post 103 %   FEF 25-75 Pre 0.77 L/sec   FEF2575-%Pred-Pre 35 %   FEF 25-75 Post 1.13 L/sec   FEF2575-%Pred-Post 52 %   FEF2575-%Change-Post 47 %   RV 2.54 L   RV % pred 131 %   TLC 3.87 L   TLC % pred 81 %   DLCO unc 7.98 ml/min/mmHg   DLCO unc % pred 42 %   DL/VA 0.86 ml/min/mmHg/L   DL/VA % pred 72 %  Rad Onc Aria Session Summary     Status: None   Collection Time: 02/16/23 11:51 AM  Result Value Ref Range   Course ID C1_Chest    Course Intent Curative    Course Start Date 01/27/2023  9:53 AM    Session Number 1    Course First Treatment Date 02/16/2023 11:49 AM    Course Last Treatment Date 02/16/2023 11:50 AM    Course Elapsed Days 0    Reference Point ID Lung_[email or web address]    Reference Point Dosage Given to Date 10 Gy   Reference Point Session Dosage Given 10 Gy   Plan ID Lung_[email or web address]    Plan Fractions Treated to Date 1    Plan Total Fractions Prescribed 5    Plan Prescribed Dose Per Fraction 10 Gy   Plan Total Prescribed Dose 50.000000 Gy   Plan Primary Reference Point Lung_[email or web address]   Rad Onc Aria Session Summary     Status: None   Collection Time: 02/21/23 11:38 AM  Result Value Ref Range   Course ID C1_Chest    Course Intent Curative    Course Start Date 01/27/2023  9:53 AM    Session Number 2    Course First Treatment Date 02/16/2023 11:49 AM    Course Last Treatment Date 02/21/2023 11:37 AM    Course Elapsed Days 5    Reference Point ID Lung_[email or web address]    Reference Point  Dosage Given to Date 20 Gy   Reference Point Session Dosage Given 10 Gy   Plan ID Lung_R_SBRT    Plan Fractions Treated to Date 2    Plan Total Fractions Prescribed 5    Plan Prescribed Dose Per Fraction 10 Gy   Plan Total Prescribed Dose 50.000000 Gy   Plan Primary Reference Point Lung_R_SBRT   Rad Jeralene Huff Session Summary  Status: None   Collection Time: 02/23/23 11:30 AM  Result Value Ref Range   Course ID C1_Chest    Course Intent Curative    Course Start Date 01/27/2023  9:53 AM    Session Number 3    Course First Treatment Date 02/16/2023 11:49 AM    Course Last Treatment Date 02/23/2023 11:29 AM    Course Elapsed Days 7    Reference Point ID Lung_[email or web address]    Reference Point Dosage Given to Date 30 Gy   Reference Point Session Dosage Given 10 Gy   Plan ID Lung_R_SBRT    Plan Fractions Treated to Date 3    Plan Total Fractions Prescribed 5    Plan Prescribed Dose Per Fraction 10 Gy   Plan Total Prescribed Dose 50.000000 Gy   Plan Primary Reference Point Lung_R_SBRT   Rad Onc Aria Session Summary     Status: None   Collection Time: 03/02/23  9:35 AM  Result Value Ref Range   Course ID C1_Chest    Course Intent Curative    Course Start Date 01/27/2023  9:53 AM    Session Number 4    Course First Treatment Date 02/16/2023 11:49 AM    Course Last Treatment Date 03/02/2023  9:33 AM    Course Elapsed Days 14    Reference Point ID Lung_R_SBRT    Reference Point Dosage Given to Date 40 Gy   Reference Point Session Dosage Given 10 Gy   Plan ID Lung_R_SBRT    Plan Fractions Treated to Date 4    Plan Total Fractions Prescribed 5    Plan Prescribed Dose Per Fraction 10 Gy   Plan Total Prescribed Dose 50.000000 Gy   Plan Primary Reference Point Lung_R_SBRT   Rad Onc Aria Session Summary     Status: None   Collection Time: 03/06/23 11:32 AM  Result Value Ref Range   Course ID C1_Chest    Course Intent Curative    Course Start Date 01/27/2023  9:53 AM    Session Number 5     Course First Treatment Date 02/16/2023 11:49 AM    Course Last Treatment Date 03/06/2023 11:31 AM    Course Elapsed Days 18    Reference Point ID Lung_R_SBRT    Reference Point Dosage Given to Date 50 Gy   Reference Point Session Dosage Given 10 Gy   Plan ID Lung_R_SBRT    Plan Fractions Treated to Date 5    Plan Total Fractions Prescribed 5    Plan Prescribed Dose Per Fraction 10 Gy   Plan Total Prescribed Dose 50.000000 Gy   Plan Primary Reference Point Lung_R_SBRT       Assessment & Plan:  Return for fasting lab work. Continue appointments with oncology.   Problem List Items Addressed This Visit       Cardiovascular and Mediastinum   Essential hypertension, benign - Primary     Other   Mixed hyperlipidemia    Return in about 3 months (around 06/11/2023) for with fasting labs prior.   Total time spent: 25 minutes  [employer], NP  03/13/2023   This document may have been prepared by Dragon Voice Recognition software and as such may include unintentional dictation errors.

## 2023-03-20 ENCOUNTER — Institutional Professional Consult (permissible substitution): Payer: Medicaid Other | Admitting: Cardiovascular Disease

## 2023-03-21 ENCOUNTER — Telehealth: Payer: Self-pay | Admitting: Oncology

## 2023-03-21 ENCOUNTER — Inpatient Hospital Stay: Payer: Medicaid Other

## 2023-03-21 ENCOUNTER — Inpatient Hospital Stay: Payer: Medicaid Other | Admitting: Oncology

## 2023-03-21 NOTE — Assessment & Plan Note (Deleted)
 T2N0 disease.  Follow up with colorectal surgeon Dr. Cliffton Asters. -surgery 04/21/2023

## 2023-03-21 NOTE — Telephone Encounter (Signed)
 Appts from today were r/s to Feb. Per the MD appts needed to be this week or next week. I spoke with pt mother and schedule the MD appt for next wed. 03/29/23

## 2023-03-21 NOTE — Assessment & Plan Note (Deleted)
 Clinical stage III triple negative right breast cancer. cT3 cN0-1 S/p  neoadjuvant carboplatin Taxol Keytruda followed by [facility] with Keytruda x 3.   4th cycle of AC with Rande Lawman was not given due to poor tolerance. Status post right mastectomy with sentinel lymph node biopsy. ypT2 ypN0, residual disease, Patient declines any adjuvant chemotherapy or immunotherapy She has multiple FDG lung nodules.  Lingular nodule was not able to be biopsied.  Continue clinical surveillance genetic counseling showed DICER VUS

## 2023-03-22 ENCOUNTER — Telehealth: Payer: Self-pay | Admitting: *Deleted

## 2023-03-22 NOTE — Telephone Encounter (Signed)
 Mother of the pt. Called and said that someone called yest and she did not get the info. I told her that she has an appt. For 03/29/2023 at 2:15. She said she got that and they will be here

## 2023-03-23 ENCOUNTER — Institutional Professional Consult (permissible substitution): Payer: Medicaid Other | Admitting: Cardiovascular Disease

## 2023-03-23 ENCOUNTER — Inpatient Hospital Stay: Payer: Medicaid Other | Attending: Oncology

## 2023-03-27 ENCOUNTER — Institutional Professional Consult (permissible substitution): Payer: Medicaid Other | Admitting: Cardiovascular Disease

## 2023-03-29 ENCOUNTER — Inpatient Hospital Stay: Payer: Medicaid Other | Admitting: Oncology

## 2023-03-29 ENCOUNTER — Encounter: Payer: Self-pay | Admitting: Oncology

## 2023-03-29 ENCOUNTER — Telehealth: Payer: Self-pay | Admitting: Oncology

## 2023-03-29 NOTE — Telephone Encounter (Signed)
 PATIENT LEFT A MESSAGE WITH ANSWERING SERVICE TO INQUIRE ABOUT HER APPTS. I LEFT A VM INFORMING HER THAT SHE HAS AN APPT TODAY WITH DR YU @ 215 PM AND WITH DR [facility] 04/13/23 @ 130 PM

## 2023-03-30 ENCOUNTER — Institutional Professional Consult (permissible substitution): Payer: Medicaid Other | Admitting: Cardiovascular Disease

## 2023-04-03 ENCOUNTER — Ambulatory Visit: Payer: Medicaid Other | Admitting: Cardiovascular Disease

## 2023-04-03 ENCOUNTER — Encounter: Payer: Self-pay | Admitting: Cardiovascular Disease

## 2023-04-03 VITALS — BP 136/80 | HR 97 | Ht 62.0 in | Wt 124.0 lb

## 2023-04-03 DIAGNOSIS — E782 Mixed hyperlipidemia: Secondary | ICD-10-CM | POA: Diagnosis not present

## 2023-04-03 DIAGNOSIS — Z01810 Encounter for preprocedural cardiovascular examination: Secondary | ICD-10-CM | POA: Diagnosis not present

## 2023-04-03 DIAGNOSIS — R609 Edema, unspecified: Secondary | ICD-10-CM

## 2023-04-03 DIAGNOSIS — I5033 Acute on chronic diastolic (congestive) heart failure: Secondary | ICD-10-CM

## 2023-04-03 DIAGNOSIS — Z720 Tobacco use: Secondary | ICD-10-CM

## 2023-04-03 DIAGNOSIS — R0602 Shortness of breath: Secondary | ICD-10-CM | POA: Diagnosis not present

## 2023-04-03 DIAGNOSIS — R9431 Abnormal electrocardiogram [ECG] [EKG]: Secondary | ICD-10-CM

## 2023-04-03 DIAGNOSIS — R42 Dizziness and giddiness: Secondary | ICD-10-CM

## 2023-04-03 DIAGNOSIS — I10 Essential (primary) hypertension: Secondary | ICD-10-CM

## 2023-04-03 NOTE — Progress Notes (Signed)
Cardiology Office Note   Date:  04/03/2023   ID:  KANIESHA DELMONTE, DOB 1960-04-12, MRN [id number]  PCP:  Marisue Ivan, NP  Cardiologist:  Adrian Blackwater, MD      History of Present Illness: Yvonne Scott is a 63 y.o. female who presents for  Chief Complaint  Patient presents with   Follow-up    Clearance    This is a 63 year old white female with a past medical history of smoking and cancer of the lung and rectum and needs clearance for surgery.  Patient is going to have chemotherapy as well as surgery for rectal cancer along with treatment for lung cancer and came for evaluation for the first time.  She denies chest pain but has shortness of breath on minimal exertion and a EKG done at the hospital was abnormal.  Since she is having colorectal surgery which is high risk I was asked to evaluate the patient by [facility] surgery preoperatively.  She states that she has a history of having swelling of the legs and symptoms of heart failure in the past.      Past Medical History:  Diagnosis Date   Acute kidney injury (HCC)    Anxiety    Aortic atherosclerosis (HCC) 10/24/2016   Breast cancer (HCC)    Chemotherapy-induced neuropathy (HCC)    Essential hypertension    GERD (gastroesophageal reflux disease)    Hypokalemia    Hypomagnesemia    Invasive ductal carcinoma of right breast (HCC)    Metabolic acidosis    Personal history of chemotherapy    Pneumonia    Severe protein-calorie malnutrition (HCC)    Thrombocytopenia (HCC)      Past Surgical History:  Procedure Laterality Date   BREAST BIOPSY Right 2022   BRONCHIAL BIOPSY  12/29/2022   Procedure: BRONCHIAL BIOPSIES;  Surgeon: Raechel Chute, MD;  Location: [facility] ENDOSCOPY;  Service: Pulmonary;;   BRONCHIAL NEEDLE ASPIRATION BIOPSY  12/29/2022   Procedure: BRONCHIAL NEEDLE ASPIRATION BIOPSIES;  Surgeon: Raechel Chute, MD;  Location: [facility] ENDOSCOPY;  Service: Pulmonary;;   COLONOSCOPY WITH PROPOFOL N/A  12/07/2022   Procedure: COLONOSCOPY WITH PROPOFOL;  Surgeon: Toney Reil, MD;  Location: [facility] ENDOSCOPY;  Service: Gastroenterology;  Laterality: N/A;   FOOT SURGERY Left    little toe correction   MASTECTOMY W/ SENTINEL NODE BIOPSY Right 10/19/2021   Procedure: MASTECTOMY WITH SENTINEL LYMPH NODE BIOPSY ( modified radical);  Surgeon: Carolan Shiver, MD;  Location: [facility] ORS;  Service: General;  Laterality: Right;   POLYPECTOMY  12/07/2022   Procedure: POLYPECTOMY;  Surgeon: Toney Reil, MD;  Location: [facility] ENDOSCOPY;  Service: Gastroenterology;;   PORTA CATH INSERTION  01/19/2021   SUBMUCOSAL TATTOO INJECTION  12/07/2022   Procedure: SUBMUCOSAL TATTOO INJECTION;  Surgeon: Toney Reil, MD;  Location: [facility] ENDOSCOPY;  Service: Gastroenterology;;   VIDEO BRONCHOSCOPY WITH ENDOBRONCHIAL ULTRASOUND N/A 12/29/2022   Procedure: VIDEO BRONCHOSCOPY WITH ENDOBRONCHIAL ULTRASOUND;  Surgeon: Raechel Chute, MD;  Location: [facility] ENDOSCOPY;  Service: Pulmonary;  Laterality: N/A;     Current Outpatient Medications  Medication Sig Dispense Refill   budesonide-formoterol (SYMBICORT) 160-4.5 MCG/ACT inhaler Inhale 2 puffs into the lungs 2 (two) times daily. 1 each 12   Calcium Carb-Cholecalciferol (OYSTER SHELL CALCIUM W/D) 500-5 MG-MCG TABS Take by mouth.     calcium carbonate (TUMS EX) 750 MG chewable tablet Chew 2 tablets by mouth 4 (four) times daily as needed for heartburn.     dicyclomine (BENTYL) 10 MG capsule Take  1 capsule (10 mg total) by mouth 4 (four) times daily -  before meals and at bedtime. 30 capsule 0   fluticasone (FLONASE) 50 MCG/ACT nasal spray Place 1 spray into both nostrils daily.     ibuprofen (ADVIL) 200 MG tablet Take 200-400 mg by mouth every 6 (six) hours as needed for headache.     lidocaine-prilocaine (EMLA) cream APPLY TO AFFECTED AREA ONCE 30 g 0   magnesium chloride (SLOW-MAG) 64 MG TBEC SR tablet Take 1 tablet (64 mg total) by mouth 2 (two) times  daily. 60 tablet 2   naloxone (NARCAN) nasal spray 4 mg/0.1 mL      omeprazole (PRILOSEC) 40 MG capsule Take 1 capsule (40 mg total) by mouth daily. 90 capsule 0   potassium chloride (MICRO-K) 10 MEQ CR capsule Take 1 capsule (10 mEq total) by mouth daily. 90 capsule 1   Tiotropium Bromide Monohydrate (SPIRIVA RESPIMAT) 2.5 MCG/ACT AERS Inhale 2 puffs into the lungs daily. 4 g 11   VENTOLIN HFA 108 (90 Base) MCG/ACT inhaler Inhale 2 puffs into the lungs every 6 (six) hours as needed for wheezing or shortness of breath. 18 g 6   Vitamin D, Ergocalciferol, (DRISDOL) 1.25 MG (50000 UNIT) CAPS capsule Take 50,000 Units by mouth once a week.     No current facility-administered medications for this visit.   Facility-Administered Medications Ordered in Other Visits  Medication Dose Route Frequency Provider Last Rate Last Admin   famotidine (PEPCID) 20-0.9 MG/50ML-% IVPB             Allergies:   Patient has no known allergies.    Social History:   reports that she has been smoking cigarettes. She started smoking about 44 years ago. She has a 44.1 pack-year smoking history. She has never used smokeless tobacco. She reports that she does not currently use alcohol after a past usage of about 3.0 standard drinks of alcohol per week. She reports that she does not currently use drugs.   Family History:  family history includes Breast cancer in her paternal grandmother; Cirrhosis in her maternal grandfather; Melanoma (age of onset: 55) in her father.    ROS:     Review of Systems  Constitutional: Negative.   HENT: Negative.    Eyes: Negative.   Respiratory: Negative.    Gastrointestinal: Negative.   Genitourinary: Negative.   Musculoskeletal: Negative.   Skin: Negative.   Neurological: Negative.   Endo/Heme/Allergies: Negative.   Psychiatric/Behavioral: Negative.    All other systems reviewed and are negative.     All other systems are reviewed and negative.    PHYSICAL EXAM: VS:  BP  136/80   Pulse 97   Ht 5\' 2"  (1.575 m)   Wt 124 lb (56.2 kg)   LMP 11/19/2002 (Approximate)   SpO2 97%   BMI 22.68 kg/m  , BMI Body mass index is 22.68 kg/m. Last weight:  Wt Readings from Last 3 Encounters:  04/03/23 124 lb (56.2 kg)  03/13/23 124 lb 6.4 oz (56.4 kg)  02/06/23 123 lb 12.8 oz (56.2 kg)     Physical Exam Constitutional:      Appearance: Normal appearance.  Cardiovascular:     Rate and Rhythm: Normal rate and regular rhythm.     Heart sounds: Normal heart sounds.  Pulmonary:     Effort: Pulmonary effort is normal.     Breath sounds: Normal breath sounds.  Musculoskeletal:     Right lower leg: No edema.  Left lower leg: No edema.  Neurological:     Mental Status: She is alert.       EKG: 12/29/22 in [facility] EKG showed NSR 90/min old ASMI, low voltage non specfic st changes  Recent Labs: 06/17/2022: TSH 1.060 10/13/2022: ALT 15 12/29/2022: BUN <5; Creatinine, Ser 0.68; Hemoglobin 14.1; Platelets 200; Potassium 3.2; Sodium 138    Lipid Panel    Component Value Date/Time   CHOL 263 (H) 06/17/2022 1349   TRIG 129 06/17/2022 1349   HDL 82 06/17/2022 1349   CHOLHDL 2.8 01/27/2017 1418   VLDL 36 (H) 10/24/2016 1534   LDLCALC 159 (H) 06/17/2022 1349   LDLCALC 80 01/27/2017 1418      Other studies Reviewed: Additional studies/ records that were reviewed today include:  Review of the above records demonstrates:       No data to display            ASSESSMENT AND PLAN:    ICD-10-CM   1. Pre-operative cardiovascular exam, new EKG abnormalities c/w ischemia  Z01.810 PCV ECHOCARDIOGRAM COMPLETE   R94.31 MYOCARDIAL PERFUSION IMAGING   Patient has high risk surgery for which she needs clearance and EKG shows ischemic changes thus will do echo and a stress test prior to surgery.    2. Tobacco abuse  Z72.0 PCV ECHOCARDIOGRAM COMPLETE    MYOCARDIAL PERFUSION IMAGING   Patient continues to smoke    3. Mixed hyperlipidemia  E78.2 PCV ECHOCARDIOGRAM  COMPLETE    MYOCARDIAL PERFUSION IMAGING    4. Essential hypertension, benign  I10 PCV ECHOCARDIOGRAM COMPLETE    MYOCARDIAL PERFUSION IMAGING    5. Dizziness  R42 PCV ECHOCARDIOGRAM COMPLETE    MYOCARDIAL PERFUSION IMAGING    6. SOB (shortness of breath)  R06.02 PCV ECHOCARDIOGRAM COMPLETE    MYOCARDIAL PERFUSION IMAGING   Patient has shortness of breath which is angina equivalent and may have coronary artery disease needing high risk surgery.  Advise echo and a stress test.    7. CHF (congestive heart failure), NYHA class III, acute on chronic, diastolic (HCC)  I50.33 PCV ECHOCARDIOGRAM COMPLETE    MYOCARDIAL PERFUSION IMAGING    8. Edema, unspecified type  R60.9 PCV ECHOCARDIOGRAM COMPLETE    MYOCARDIAL PERFUSION IMAGING    9. Pre-operative cardiovascular examination, high risk surgery  Z01.810 PCV ECHOCARDIOGRAM COMPLETE    MYOCARDIAL PERFUSION IMAGING       Problem List Items Addressed This Visit       Cardiovascular and Mediastinum   Essential hypertension, benign   Relevant Orders   PCV ECHOCARDIOGRAM COMPLETE   MYOCARDIAL PERFUSION IMAGING     Other   Tobacco abuse (Chronic)   Relevant Orders   PCV ECHOCARDIOGRAM COMPLETE   MYOCARDIAL PERFUSION IMAGING   Mixed hyperlipidemia   Relevant Orders   PCV ECHOCARDIOGRAM COMPLETE   MYOCARDIAL PERFUSION IMAGING   Other Visit Diagnoses       Pre-operative cardiovascular exam, new EKG abnormalities c/w ischemia    -  Primary   Patient has high risk surgery for which she needs clearance and EKG shows ischemic changes thus will do echo and a stress test prior to surgery.   Relevant Orders   PCV ECHOCARDIOGRAM COMPLETE   MYOCARDIAL PERFUSION IMAGING     Dizziness       Relevant Orders   PCV ECHOCARDIOGRAM COMPLETE   MYOCARDIAL PERFUSION IMAGING     SOB (shortness of breath)       Patient has shortness of breath which is angina  equivalent and may have coronary artery disease needing high risk surgery.  Advise echo and  a stress test.   Relevant Orders   PCV ECHOCARDIOGRAM COMPLETE   MYOCARDIAL PERFUSION IMAGING     CHF (congestive heart failure), NYHA class III, acute on chronic, diastolic (HCC)       Relevant Orders   PCV ECHOCARDIOGRAM COMPLETE   MYOCARDIAL PERFUSION IMAGING     Edema, unspecified type       Relevant Orders   PCV ECHOCARDIOGRAM COMPLETE   MYOCARDIAL PERFUSION IMAGING     Pre-operative cardiovascular examination, high risk surgery       Relevant Orders   PCV ECHOCARDIOGRAM COMPLETE   MYOCARDIAL PERFUSION IMAGING          Disposition:   Return in about 3 weeks (around 04/24/2023) for echo, stress test and f/u.    Total time spent: 50 minutes  Signed,  Adrian Blackwater, MD  04/03/2023 2:40 PM    [facility]

## 2023-04-07 NOTE — Progress Notes (Signed)
Sent message, via epic in basket, requesting orders in epic from Careers adviser.

## 2023-04-10 ENCOUNTER — Other Ambulatory Visit: Payer: Self-pay | Admitting: Cardiology

## 2023-04-11 ENCOUNTER — Ambulatory Visit: Payer: Self-pay | Admitting: Surgery

## 2023-04-11 DIAGNOSIS — Z01818 Encounter for other preprocedural examination: Secondary | ICD-10-CM

## 2023-04-12 ENCOUNTER — Ambulatory Visit (INDEPENDENT_AMBULATORY_CARE_PROVIDER_SITE_OTHER): Payer: Medicaid Other

## 2023-04-12 DIAGNOSIS — R42 Dizziness and giddiness: Secondary | ICD-10-CM

## 2023-04-12 DIAGNOSIS — R9431 Abnormal electrocardiogram [ECG] [EKG]: Secondary | ICD-10-CM

## 2023-04-12 DIAGNOSIS — Z720 Tobacco use: Secondary | ICD-10-CM

## 2023-04-12 DIAGNOSIS — Z01810 Encounter for preprocedural cardiovascular examination: Secondary | ICD-10-CM

## 2023-04-12 DIAGNOSIS — R609 Edema, unspecified: Secondary | ICD-10-CM

## 2023-04-12 DIAGNOSIS — I10 Essential (primary) hypertension: Secondary | ICD-10-CM

## 2023-04-12 DIAGNOSIS — R0602 Shortness of breath: Secondary | ICD-10-CM

## 2023-04-12 DIAGNOSIS — I5033 Acute on chronic diastolic (congestive) heart failure: Secondary | ICD-10-CM

## 2023-04-12 DIAGNOSIS — E782 Mixed hyperlipidemia: Secondary | ICD-10-CM

## 2023-04-12 NOTE — Patient Instructions (Signed)
DUE TO COVID-19 ONLY TWO VISITORS  (aged 64 and older)  ARE ALLOWED TO COME WITH YOU AND STAY IN THE WAITING ROOM ONLY DURING PRE OP AND PROCEDURE.   **NO VISITORS ARE ALLOWED IN THE SHORT STAY AREA OR RECOVERY ROOM!!**  IF YOU WILL BE ADMITTED INTO THE HOSPITAL YOU ARE ALLOWED ONLY FOUR SUPPORT PEOPLE DURING VISITATION HOURS ONLY (7 AM -8PM)   The support person(s) must pass our screening, gel in and out, and wear a mask at all times, including in the patient's room. Patients must also wear a mask when staff or their support person are in the room. Visitors GUEST BADGE MUST BE WORN VISIBLY  One adult visitor may remain with you overnight and MUST be in the room by 8 P.M.     Your procedure is scheduled on: 04/21/23   Report to [facility] Main Entrance    Report to admitting at : 10:45 AM   Call this number if you have problems the morning of surgery [phone]    Clear liquids starting the day before surgery until : 10:00 AM DAY OF SURGERY. Drink plenty clear liquids the day before surgery.  Water Black Coffee (sugar ok, NO MILK/CREAM OR CREAMERS)  Tea (sugar ok, NO MILK/CREAM OR CREAMERS) regular and decaf                             Plain Jell-O (NO RED)                                           Fruit ices (not with fruit pulp, NO RED)                                     Popsicles (NO RED)                                                                  Juice: apple, WHITE grape, WHITE cranberry Sports drinks like Gatorade (NO RED)              Drink 2 Ensure/G2 drinks AT 10:00 PM the night before surgery.     The day of surgery:  Drink ONE (1) Pre-Surgery Clear Ensure or G2 at : 10:00 AM the morning of surgery. Drink in one sitting. Do not sip.  This drink was given to you during your hospital  pre-op appointment visit. Nothing else to drink after completing the  Pre-Surgery Clear Ensure or G2.          If you have questions, please contact your surgeon's  office.  FOLLOW BOWEL PREP AND ANY ADDITIONAL PRE OP INSTRUCTIONS YOU RECEIVED FROM YOUR SURGEON'S OFFICE!!!   Oral Hygiene is also important to reduce your risk of infection.                                    Remember - BRUSH YOUR TEETH THE MORNING OF SURGERY WITH YOUR REGULAR TOOTHPASTE  DENTURES WILL BE REMOVED PRIOR TO SURGERY PLEASE DO NOT APPLY "Poly grip" OR ADHESIVES!!!   Do NOT smoke after Midnight   Take these medicines the morning of surgery with A SIP OF WATER: omeprazole.Use inhalers as usual.                              You may not have any metal on your body including hair pins, jewelry, and body piercing             Do not wear make-up, lotions, powders, perfumes/cologne, or deodorant  Do not wear nail polish including gel and S&S, artificial/acrylic nails, or any other type of covering on natural nails including finger and toenails. If you have artificial nails, gel coating, etc. that needs to be removed by a nail salon please have this removed prior to surgery or surgery may need to be canceled/ delayed if the surgeon/ anesthesia feels like they are unable to be safely monitored.   Do not shave  48 hours prior to surgery.    Do not bring valuables to the hospital. [facility] IS NOT             RESPONSIBLE   FOR VALUABLES.   Contacts, glasses, or bridgework may not be worn into surgery.   Bring small overnight bag day of surgery.   DO NOT BRING YOUR HOME MEDICATIONS TO THE HOSPITAL. PHARMACY WILL DISPENSE MEDICATIONS LISTED ON YOUR MEDICATION LIST TO YOU DURING YOUR ADMISSION IN THE HOSPITAL!    Patients discharged on the day of surgery will not be allowed to drive home.  Someone NEEDS to stay with you for the first 24 hours after anesthesia.   Special Instructions: Bring a copy of your healthcare power of attorney and living will documents         the day of surgery if you haven't scanned them before.              Please read over the following fact sheets you  were given: IF YOU HAVE QUESTIONS ABOUT YOUR PRE-OP INSTRUCTIONS PLEASE CALL [phone]    [facility] - Preparing for Surgery Before surgery, you can play an important role.  Because skin is not sterile, your skin needs to be as free of germs as possible.  You can reduce the number of germs on your skin by washing with CHG (chlorahexidine gluconate) soap before surgery.  CHG is an antiseptic cleaner which kills germs and bonds with the skin to continue killing germs even after washing. Please DO NOT use if you have an allergy to CHG or antibacterial soaps.  If your skin becomes reddened/irritated stop using the CHG and inform your nurse when you arrive at Short Stay. Do not shave (including legs and underarms) for at least 48 hours prior to the first CHG shower.  You may shave your face/neck. Please follow these instructions carefully:  1.  Shower with CHG Soap the night before surgery and the  morning of Surgery.  2.  If you choose to wash your hair, wash your hair first as usual with your  normal  shampoo.  3.  After you shampoo, rinse your hair and body thoroughly to remove the  shampoo.                           4.  Use CHG as you would any other liquid soap.  You can apply chg directly  to the skin and wash                       Gently with a scrungie or clean washcloth.  5.  Apply the CHG Soap to your body ONLY FROM THE NECK DOWN.   Do not use on face/ open                           Wound or open sores. Avoid contact with eyes, ears mouth and genitals (private parts).                       Wash face,  Genitals (private parts) with your normal soap.             6.  Wash thoroughly, paying special attention to the area where your surgery  will be performed.  7.  Thoroughly rinse your body with warm water from the neck down.  8.  DO NOT shower/wash with your normal soap after using and rinsing off  the CHG Soap.                9.  Pat yourself dry with a clean towel.            10.  Wear  clean pajamas.            11.  Place clean sheets on your bed the night of your first shower and do not  sleep with pets. Day of Surgery : Do not apply any lotions/deodorants the morning of surgery.  Please wear clean clothes to the hospital/surgery center.  FAILURE TO FOLLOW THESE INSTRUCTIONS MAY RESULT IN THE CANCELLATION OF YOUR SURGERY PATIENT SIGNATURE_________________________________  NURSE SIGNATURE__________________________________  ________________________________________________________________________  Yvonne Scott  An incentive spirometer is a tool that can help keep your lungs clear and active. This tool measures how well you are filling your lungs with each breath. Taking long deep breaths may help reverse or decrease the chance of developing breathing (pulmonary) problems (especially infection) following: A long period of time when you are unable to move or be active. BEFORE THE PROCEDURE  If the spirometer includes an indicator to show your best effort, your nurse or respiratory therapist will set it to a desired goal. If possible, sit up straight or lean slightly forward. Try not to slouch. Hold the incentive spirometer in an upright position. INSTRUCTIONS FOR USE  Sit on the edge of your bed if possible, or sit up as far as you can in bed or on a chair. Hold the incentive spirometer in an upright position. Breathe out normally. Place the mouthpiece in your mouth and seal your lips tightly around it. Breathe in slowly and as deeply as possible, raising the piston or the ball toward the top of the column. Hold your breath for 3-5 seconds or for as long as possible. Allow the piston or ball to fall to the bottom of the column. Remove the mouthpiece from your mouth and breathe out normally. Rest for a few seconds and repeat Steps 1 through 7 at least 10 times every 1-2 hours when you are awake. Take your time and take a few normal breaths between deep breaths. The  spirometer may include an indicator to show your best effort. Use the indicator as a goal to work toward during each repetition. After each set of 10 deep breaths, practice coughing to  be sure your lungs are clear. If you have an incision (the cut made at the time of surgery), support your incision when coughing by placing a pillow or rolled up towels firmly against it. Once you are able to get out of bed, walk around indoors and cough well. You may stop using the incentive spirometer when instructed by your caregiver.  RISKS AND COMPLICATIONS Take your time so you do not get dizzy or light-headed. If you are in pain, you may need to take or ask for pain medication before doing incentive spirometry. It is harder to take a deep breath if you are having pain. AFTER USE Rest and breathe slowly and easily. It can be helpful to keep track of a log of your progress. Your caregiver can provide you with a simple table to help with this. If you are using the spirometer at home, follow these instructions: SEEK MEDICAL CARE IF:  You are having difficultly using the spirometer. You have trouble using the spirometer as often as instructed. Your pain medication is not giving enough relief while using the spirometer. You develop fever of 100.5 F (38.1 C) or higher. SEEK IMMEDIATE MEDICAL CARE IF:  You cough up bloody sputum that had not been present before. You develop fever of 102 F (38.9 C) or greater. You develop worsening pain at or near the incision site. MAKE SURE YOU:  Understand these instructions. Will watch your condition. Will get help right away if you are not doing well or get worse. Document Released: 07/11/2006 Document Revised: 05/23/2011 Document Reviewed: 09/11/2006 [employer] Patient Information 2014 [employer], [place].   ________________________________________________________________________ WHAT IS A BLOOD TRANSFUSION? Blood Transfusion Information  A transfusion is the  replacement of blood or some of its parts. Blood is made up of multiple cells which provide different functions. Red blood cells carry oxygen and are used for blood loss replacement. White blood cells fight against infection. Platelets control bleeding. Plasma helps clot blood. Other blood products are available for specialized needs, such as hemophilia or other clotting disorders. BEFORE THE TRANSFUSION  Who gives blood for transfusions?  Healthy volunteers who are fully evaluated to make sure their blood is safe. This is blood bank blood. Transfusion therapy is the safest it has ever been in the practice of medicine. Before blood is taken from a donor, a complete history is taken to make sure that person has no history of diseases nor engages in risky social behavior (examples are intravenous drug use or sexual activity with multiple partners). The donor's travel history is screened to minimize risk of transmitting infections, such as malaria. The donated blood is tested for signs of infectious diseases, such as HIV and hepatitis. The blood is then tested to be sure it is compatible with you in order to minimize the chance of a transfusion reaction. If you or a relative donates blood, this is often done in anticipation of surgery and is not appropriate for emergency situations. It takes many days to process the donated blood. RISKS AND COMPLICATIONS Although transfusion therapy is very safe and saves many lives, the main dangers of transfusion include:  Getting an infectious disease. Developing a transfusion reaction. This is an allergic reaction to something in the blood you were given. Every precaution is taken to prevent this. The decision to have a blood transfusion has been considered carefully by your caregiver before blood is given. Blood is not given unless the benefits outweigh the risks. AFTER THE TRANSFUSION Right after receiving a blood  transfusion, you will usually feel much better and  more energetic. This is especially true if your red blood cells have gotten low (anemic). The transfusion raises the level of the red blood cells which carry oxygen, and this usually causes an energy increase. The nurse administering the transfusion will monitor you carefully for complications. HOME CARE INSTRUCTIONS  No special instructions are needed after a transfusion. You may find your energy is better. Speak with your caregiver about any limitations on activity for underlying diseases you may have. SEEK MEDICAL CARE IF:  Your condition is not improving after your transfusion. You develop redness or irritation at the intravenous (IV) site. SEEK IMMEDIATE MEDICAL CARE IF:  Any of the following symptoms occur over the next 12 hours: Shaking chills. You have a temperature by mouth above 102 F (38.9 C), not controlled by medicine. Chest, back, or muscle pain. People around you feel you are not acting correctly or are confused. Shortness of breath or difficulty breathing. Dizziness and fainting. You get a rash or develop hives. You have a decrease in urine output. Your urine turns a dark color or changes to pink, red, or brown. Any of the following symptoms occur over the next 10 days: You have a temperature by mouth above 102 F (38.9 C), not controlled by medicine. Shortness of breath. Weakness after normal activity. The white part of the eye turns yellow (jaundice). You have a decrease in the amount of urine or are urinating less often. Your urine turns a dark color or changes to pink, red, or brown. Document Released: 02/26/2000 Document Revised: 05/23/2011 Document Reviewed: 10/15/2007 [facility] Patient Information 2014 [place], [place].  _______________________________________________________________________

## 2023-04-13 ENCOUNTER — Encounter: Payer: Self-pay | Admitting: Radiation Oncology

## 2023-04-13 ENCOUNTER — Ambulatory Visit
Admission: RE | Admit: 2023-04-13 | Discharge: 2023-04-13 | Disposition: A | Discharge status: Home / Self Care | Payer: Medicaid Other | Source: Ambulatory Visit | Attending: Radiation Oncology | Admitting: Radiation Oncology

## 2023-04-13 ENCOUNTER — Ambulatory Visit (INDEPENDENT_AMBULATORY_CARE_PROVIDER_SITE_OTHER): Payer: Medicaid Other

## 2023-04-13 VITALS — BP 136/89 | HR 87 | Temp 97.6°F | Resp 19 | Wt 122.7 lb

## 2023-04-13 DIAGNOSIS — I5033 Acute on chronic diastolic (congestive) heart failure: Secondary | ICD-10-CM | POA: Diagnosis not present

## 2023-04-13 DIAGNOSIS — R9431 Abnormal electrocardiogram [ECG] [EKG]: Secondary | ICD-10-CM

## 2023-04-13 DIAGNOSIS — Z01810 Encounter for preprocedural cardiovascular examination: Secondary | ICD-10-CM

## 2023-04-13 DIAGNOSIS — C3491 Malignant neoplasm of unspecified part of right bronchus or lung: Secondary | ICD-10-CM | POA: Insufficient documentation

## 2023-04-13 DIAGNOSIS — E782 Mixed hyperlipidemia: Secondary | ICD-10-CM

## 2023-04-13 DIAGNOSIS — R0602 Shortness of breath: Secondary | ICD-10-CM | POA: Diagnosis not present

## 2023-04-13 DIAGNOSIS — R42 Dizziness and giddiness: Secondary | ICD-10-CM

## 2023-04-13 DIAGNOSIS — Z720 Tobacco use: Secondary | ICD-10-CM

## 2023-04-13 DIAGNOSIS — I10 Essential (primary) hypertension: Secondary | ICD-10-CM

## 2023-04-13 DIAGNOSIS — R609 Edema, unspecified: Secondary | ICD-10-CM

## 2023-04-13 NOTE — Progress Notes (Signed)
Radiation Oncology Follow up Note  Name: Yvonne Scott   Date:   04/13/2023 MRN:  [id number] DOB: Dec 05, 1960    This 63 y.o. female presents to the clinic today for 1 month follow-up status post SBRT to her right upper lobe and patient with known 3 carcinomas including right breast cancer as well as adenocarcinoma of the rectum.  REFERRING PROVIDER: Marisue Ivan, NP  HPI: Patient is a 63 year old female now out 1 month having completed SBRT to her right upper lobe.  Seen today in follow-up she is doing well specifically [place] cough hemoptysis chest tightness or any change in pulmonary status she is scheduled for rectal surgery over the next several weeks and may see [place] again for possibility of adjuvant treatment depending on the results of that surgery..  COMPLICATIONS OF TREATMENT: none  FOLLOW UP COMPLIANCE: keeps appointments   PHYSICAL EXAM:  BP 136/89   Pulse 87   Temp 97.6 F (36.4 C)   Resp 19   Wt 122 lb 11.2 oz (55.7 kg)   LMP 11/19/2002 (Approximate)   SpO2 98%   BMI 22.44 kg/m  Well-developed well-nourished patient in NAD. HEENT reveals PERLA, EOMI, discs not visualized.  Oral cavity is clear. No oral mucosal lesions are identified. Neck is clear without evidence of cervical or supraclavicular adenopathy. Lungs are clear to A&P. Cardiac examination is essentially unremarkable with regular rate and rhythm without murmur rub or thrill. Abdomen is benign with no organomegaly or masses noted. Motor sensory and DTR levels are equal and symmetric in the upper and lower extremities. Cranial nerves II through XII are grossly intact. Proprioception is intact. No peripheral adenopathy or edema is identified. No motor or sensory levels are noted. Crude visual fields are within normal range.  RADIOLOGY RESULTS: No current films to review CT scan of the chest ordered in 3 months  PLAN: Present time we will follow-up on her pathology of her rectal cancer.  I have also ordered a  follow-up in 3 to 4 months with a CT scan at that time.  Patient knows to call with any concerns she already has follow-up appointments with medical oncology.  I would like to take this opportunity to thank you for allowing me to participate in the care of your patient.Yvonne Miller, MD

## 2023-04-14 ENCOUNTER — Encounter (HOSPITAL_COMMUNITY)
Admission: RE | Admit: 2023-04-14 | Discharge: 2023-04-14 | Disposition: A | Discharge status: Home / Self Care | Payer: Medicaid Other | Source: Ambulatory Visit | Attending: Surgery | Admitting: Surgery

## 2023-04-14 ENCOUNTER — Other Ambulatory Visit: Payer: Self-pay

## 2023-04-14 ENCOUNTER — Encounter (HOSPITAL_COMMUNITY): Payer: Self-pay

## 2023-04-14 DIAGNOSIS — Z01818 Encounter for other preprocedural examination: Secondary | ICD-10-CM

## 2023-04-14 DIAGNOSIS — Z01812 Encounter for preprocedural laboratory examination: Secondary | ICD-10-CM | POA: Insufficient documentation

## 2023-04-14 HISTORY — DX: Depression, unspecified: F32.A

## 2023-04-14 HISTORY — DX: Dyspnea, unspecified: R06.00

## 2023-04-14 LAB — COMPREHENSIVE METABOLIC PANEL
ALT: 10 U/L (ref 0–44)
AST: 18 U/L (ref 15–41)
Albumin: 3.7 g/dL (ref 3.5–5.0)
Alkaline Phosphatase: 74 U/L (ref 38–126)
Anion gap: 13 (ref 5–15)
BUN: 6 mg/dL — ABNORMAL LOW (ref 8–23)
CO2: 22 mmol/L (ref 22–32)
Calcium: 9 mg/dL (ref 8.9–10.3)
Chloride: 105 mmol/L (ref 98–111)
Creatinine, Ser: 0.75 mg/dL (ref 0.44–1.00)
GFR, Estimated: >60 mL/min (ref >60)
Glucose, Bld: 98 mg/dL (ref 70–99)
Potassium: 3.3 mmol/L — ABNORMAL LOW (ref 3.5–5.1)
Sodium: 140 mmol/L (ref 135–145)
Total Bilirubin: 0.6 mg/dL (ref 0.0–1.2)
Total Protein: 6.6 g/dL (ref 6.5–8.1)

## 2023-04-14 LAB — CBC WITH DIFFERENTIAL/PLATELET
Abs Immature Granulocytes: 0.01 10*3/uL (ref 0.00–0.07)
Basophils Absolute: 0.1 10*3/uL (ref 0.0–0.1)
Basophils Relative: 1 %
Eosinophils Absolute: 0.2 10*3/uL (ref 0.0–0.5)
Eosinophils Relative: 3 %
HCT: 40.8 % (ref 36.0–46.0)
Hemoglobin: 13.6 g/dL (ref 12.0–15.0)
Immature Granulocytes: 0 %
Lymphocytes Relative: 29 %
Lymphs Abs: 2.2 10*3/uL (ref 0.7–4.0)
MCH: 32.5 pg (ref 26.0–34.0)
MCHC: 33.3 g/dL (ref 30.0–36.0)
MCV: 97.6 fL (ref 80.0–100.0)
Monocytes Absolute: 0.6 10*3/uL (ref 0.1–1.0)
Monocytes Relative: 8 %
Neutro Abs: 4.6 10*3/uL (ref 1.7–7.7)
Neutrophils Relative %: 59 %
Platelets: 255 10*3/uL (ref 150–400)
RBC: 4.18 MIL/uL (ref 3.87–5.11)
RDW: 15.5 % (ref 11.5–15.5)
WBC: 7.7 10*3/uL (ref 4.0–10.5)
nRBC: 0 % (ref 0.0–0.2)

## 2023-04-14 MED ORDER — TECHNETIUM TC 99M SESTAMIBI GENERIC - CARDIOLITE
11.0000 | Freq: Once | INTRAVENOUS | Status: AC | PRN
  Administered 2023-04-13: 11 via INTRAVENOUS

## 2023-04-14 MED ORDER — TECHNETIUM TC 99M SESTAMIBI GENERIC - CARDIOLITE
32.0000 | Freq: Once | INTRAVENOUS | Status: AC | PRN
  Administered 2023-04-13: 32 via INTRAVENOUS

## 2023-04-14 NOTE — Consult Note (Signed)
WOC Nurse requested for preoperative stoma site marking  Discussed surgical procedure and stoma creation with patient.  Explained role of the WOC nurse team.  Provided the patient with educational booklet and provided samples of pouching options.  Answered patient questions.   Examined patient lying, sitting, and standing in order to place the marking in the patient's visual field, away from any creases or abdominal contour issues and within the rectus muscle. Patient has significant creasing extending from umbilicus and this area should be avoided if possible.   Attempted to mark below the patient's belt line.   Marked for colostomy in the LLQ 4.5 cm to the left of the umbilicus and 6 cm below the umbilicus.  Marked for ileostomy in the RLQ  3.5 cm to the right of the umbilicus and  6 cm below the umbilicus.  Patient's abdomen cleansed with CHG wipes at site markings, allowed to air dry prior to marking.Covered mark with thin film transparent dressing to preserve mark until date of surgery. Patients surgery is scheduled for next Friday 04/21/2023.  I gave patient marker and additional thin films to remake marks if needed prior to surgery.   WOC Nurse team will follow up with patient after surgery for continue ostomy care and teaching.   Thank you,    Priscella Mann MSN, RN-BC, [employer] [phone]

## 2023-04-14 NOTE — Progress Notes (Addendum)
Anesthesia Review:  PCP: Marisue Ivan , LOV `03/13/2023  Cardiologist : DR Welton Flakes =- LOV 04/03/23 clearance pending appt 04/17/23  Pulmonology- Dgayli  LOV 12/14/2022  Bron choscopy - 12/29/22  Chest x-ray : 12/29/22  EKG : 12/29/22  Echo : 04/12/23  done to obtain results on 04/17/23 Stress test: Cardiac Cath :  Activity level: can do a flight of stairs without difficutl  Sleep Study/ CPAP : none  Fasting Blood Sugar :      / Checks Blood Sugar -- times a day:   Blood Thinner/ Instructions /Last Dose: ASA / Instructions/ Last Dose :    Chemo completed- 2023  Radiaiton completed 02/2023    PORT  NO b/p etc in right arm    Smoker    PT reports that chemo destroyed her teeth .  NO oinfecitons or abscesses per pt at time of preop appt.    PT has bowel prep instructons from surgeon and clear liquid instructons from surgeon office with her at preop appt. Instructed to follow those instructons.  PT voiced understanding.

## 2023-04-17 ENCOUNTER — Other Ambulatory Visit: Payer: Self-pay

## 2023-04-17 ENCOUNTER — Ambulatory Visit: Payer: Medicaid Other | Admitting: Cardiovascular Disease

## 2023-04-17 MED ORDER — FLUTICASONE PROPIONATE 50 MCG/ACT NA SUSP: 1.0000 | Freq: Every day | NASAL | 11 refills | Status: DC

## 2023-04-20 NOTE — Anesthesia Preprocedure Evaluation (Addendum)
 Anesthesia Evaluation  Patient identified by MRN, date of birth, ID band Patient awake    Reviewed: Allergy & Precautions, NPO status , Patient's Chart, lab work & pertinent test results  History of Anesthesia Complications Negative for: history of anesthetic complications  Airway Mallampati: II  TM Distance: >3 FB Neck ROM: Full    Dental  (+) Missing, Poor Dentition,    Pulmonary COPD,  COPD inhaler, Current Smoker Hx lung ca   Pulmonary exam normal        Cardiovascular hypertension, Normal cardiovascular exam     Neuro/Psych   Anxiety Depression       GI/Hepatic Neg liver ROS,GERD  Medicated,,Rectal ca   Endo/Other  diabetes    Renal/GU Renal InsufficiencyRenal disease  negative genitourinary   Musculoskeletal negative musculoskeletal ROS (+)    Abdominal   Peds  Hematology negative hematology ROS (+)   Anesthesia Other Findings Hx breast ca  Reproductive/Obstetrics                              Anesthesia Physical Anesthesia Plan  ASA: 2  Anesthesia Plan: General   Post-op Pain Management: Tylenol  PO (pre-op)* and Ketamine  IV*   Induction: Intravenous  PONV Risk Score and Plan: 3 and Treatment may vary due to age or medical condition, Ondansetron , Dexamethasone  and Midazolam   Airway Management Planned: Oral ETT  Additional Equipment:   Intra-op Plan:   Post-operative Plan: Extubation in OR  Informed Consent: I have reviewed the patients History and Physical, chart, labs and discussed the procedure including the risks, benefits and alternatives for the proposed anesthesia with the patient or authorized representative who has indicated his/her understanding and acceptance.     Dental advisory given  Plan Discussed with: CRNA  Anesthesia Plan Comments: (See PAT note 04/14/2023)        Anesthesia Quick Evaluation

## 2023-04-20 NOTE — H&P (Signed)
 CC: Here today for surgery  HPI: Yvonne Scott is an 63 y.o. female with history of breast cancer, SCC lung cancer, asthma whom is seen in the office today as a referral by Dr. Babara for evaluation of rectal cancer.   Colonoscopy with Dr. Delana 12/07/2022: - Greater than 5 cm polyp in the mid rectum. Resection not attempted. Biopsy. Tattoo. - Distal rectum and anal verge normal - Exam otherwise normal - Examined portion of ileum normal  PATH 1. Rectum, polyp(s), CBX :  - AT LEAST INTRAMUCOSAL CARCINOMA (MULTIPLE FRAGMENTS)  - SEE NOTE   Did have CT CAP 05/2022 IMPRESSION: 1. Interval right mastectomy with resolution of suspicious right axillary nodes. 2. Enlargement of a 5 mm right lower lobe pulmonary nodule. Development of a 5 mm left upper lobe pulmonary nodule. Cannot exclude early metastatic disease. Consider chest CT follow-up at 3 months. 3. Otherwise, no evidence of metastatic disease in the chest, abdomen, or pelvis. 4. Age advanced coronary artery atherosclerosis. Recommend assessment of coronary risk factors. 5. Aortic atherosclerosis (ICD10-I70.0) and emphysema (ICD10-J43.9). 6. Mild enlargement of the common duct, which is now minimally dilated for age. Correlate with bilirubin levels. If elevated, consider ERCP.  CT Chest 09/2022 1. Interval growth of three solid bilateral pulmonary nodules since 05/25/2022 chest CT, largest 0.9 cm in the anteromedial left upper lobe, highly suspicious for pulmonary metastases. 2. No thoracic adenopathy. 3. Three-vessel coronary atherosclerosis. 4. Aortic Atherosclerosis (ICD10-I70.0) and Emphysema (ICD10-J43.9).  CT PET 09/26/22 1. Hypermetabolic bilateral pulmonary nodules, consistent with metastasis. 2. No other evidence of tracer avid metastatic disease, status post right mastectomy. 3. Hypermetabolism with suggestion of soft tissue fullness in the mid rectum, suspicious for polyp or carcinoma. Consider  further evaluation with sigmoidoscopy. 4. Incidental findings, including: Aortic atherosclerosis (ICD10-I70.0), coronary artery atherosclerosis and emphysema (ICD10-J43.9).  She currently has triple negative breast cancer but has completed all treatments per Dr. Babara. She has recently been diagnosed with an early stage lung squamous cell carcinoma and is getting ready to start lung SBRT.  Genetics: - Underwent genetic testing 01/24/2023 which demonstrated a variant of uncertain significance - [id number]  Currently, denies any symptoms related to the apparent rectal mass. She denies any diarrhea/constipation. She has occasionally had some loose stool particularly on chemo. Denies any blood in her stool. Denies any significant abdominal pain, cramping, bloating.  PMH: GERD, asthma, breast cancer (completed treatment per Dr. Babara), early stage [facility] of lung per Dr. Babara - undergoing SBRT  PSH: She denies ever having had any abdominal or pelvic surgical procedures.  FHx: Denies any known family history of colorectal, breast, endometrial or ovarian cancer  Social Hx: Currently smokes working on quitting-states she is only having 1 maybe 2 cigarettes a day. Denies use of EtOH/drugs. She is here today with her mother. 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   Review of Systems: A complete review of systems was obtained from the patient. I have reviewed this information and discussed as appropriate with the patient. See HPI as well for other ROS.   Completed SBRT treatment for SCC of lung. She otherwise denies any changes in health or health history since we met in the office. No new medications/allergies. She states she is ready for surgery today.  Past Medical History:  Diagnosis Date   Acute kidney injury [facility])    Anxiety    Aortic atherosclerosis (HCC) 10/24/2016   Breast cancer (HCC)    colon cancer  Chemotherapy-induced neuropathy (HCC)    Depression     Dyspnea    GERD (gastroesophageal reflux disease)    Hypokalemia    Hypomagnesemia    Invasive ductal carcinoma of right breast (HCC)    Metabolic acidosis    Personal history of chemotherapy    Pneumonia    Severe protein-calorie malnutrition (HCC)    Thrombocytopenia (HCC)     Past Surgical History:  Procedure Laterality Date   BREAST BIOPSY Right 2022   BRONCHIAL BIOPSY  12/29/2022   Procedure: BRONCHIAL BIOPSIES;  Surgeon: Isadora Hose, MD;  Location: [facility] ENDOSCOPY;  Service: Pulmonary;;   BRONCHIAL NEEDLE ASPIRATION BIOPSY  12/29/2022   Procedure: BRONCHIAL NEEDLE ASPIRATION BIOPSIES;  Surgeon: Isadora Hose, MD;  Location: [facility] ENDOSCOPY;  Service: Pulmonary;;   COLONOSCOPY WITH PROPOFOL  N/A 12/07/2022   Procedure: COLONOSCOPY WITH PROPOFOL ;  Surgeon: Unk Corinn Skiff, MD;  Location: [facility] ENDOSCOPY;  Service: Gastroenterology;  Laterality: N/A;   FOOT SURGERY Left    little toe correction   MASTECTOMY W/ SENTINEL NODE BIOPSY Right 10/19/2021   Procedure: MASTECTOMY WITH SENTINEL LYMPH NODE BIOPSY ( modified radical);  Surgeon: Rodolph Romano, MD;  Location: [facility] ORS;  Service: General;  Laterality: Right;   POLYPECTOMY  12/07/2022   Procedure: POLYPECTOMY;  Surgeon: Unk Corinn Skiff, MD;  Location: [facility] ENDOSCOPY;  Service: Gastroenterology;;   PORTA CATH INSERTION  01/19/2021   SUBMUCOSAL TATTOO INJECTION  12/07/2022   Procedure: SUBMUCOSAL TATTOO INJECTION;  Surgeon: Unk Corinn Skiff, MD;  Location: [facility] ENDOSCOPY;  Service: Gastroenterology;;   VIDEO BRONCHOSCOPY WITH ENDOBRONCHIAL ULTRASOUND N/A 12/29/2022   Procedure: VIDEO BRONCHOSCOPY WITH ENDOBRONCHIAL ULTRASOUND;  Surgeon: Isadora Hose, MD;  Location: [facility] ENDOSCOPY;  Service: Pulmonary;  Laterality: N/A;    Family History  Problem Relation Age of Onset   Melanoma Father 65   Cirrhosis Maternal Grandfather    Breast cancer Paternal Grandmother     Social:  reports that she has been smoking  cigarettes. She has never used smokeless tobacco. She reports that she does not drink alcohol  and does not use drugs.  Allergies: No Known Allergies  Medications: I have reviewed the patient's current medications.  No results found for this or any previous visit (from the past 48 hours).  No results found.   PE Last menstrual period 11/19/2002. Constitutional: NAD; conversant Eyes: Moist conjunctiva; no lid lag; anicteric Lungs: Normal respiratory effort CV: RRR GI: Abd soft, NT/ND Psychiatric: Appropriate affect  No results found for this or any previous visit (from the past 48 hours).  No results found.  A/P: ALAILAH SAFLEY is an 63 y.o. female with hx of breast cancer, SCC lung cancer, asthma here for evaluation of apparent rectal polypoid mass with biopsies demonstrating cancer.  Cscope 12/07/22 - >5 cm polyp in mid rectum, biopsied, tattooed - on photos, there is also tattoo see just distal to this  CT CAP, no evidence of metastatic rectal cancer; now on treatment for SCC of the lung; recent breast cancer (completed therapy)  MRI Pelvis - 4.1 cm upper/mid rectal mass, 7.0 cm from internal anal sphincter, 9.7 cm from anal verge. No extension through muscularis propria. No suspicious adenopathy.  She is completing a course of oral prednisone  03/06/2023. She is about to complete radiation to the chest at the same time.  -Strongly recommended she quit smoking and discussed rationale therein including a higher risk for infectious complications, anastomotic complications including leak and need for ostomy, cancer recurrence, hernia, heart attack, stroke, pneumonia, death. She  has continued to smoke but cut back to 1-2 cigarettes per day. She understands this risk and is willing to proceed.  -The anatomy and physiology of the GI tract was reviewed with the patient. The pathophysiology of rectal polyps and cancer was discussed as well with associated pictures.  -We have discussed  various different treatment options going forward including surgery (the most definitive) to address this -robotic assisted low anterior resection; flexible sigmoidoscopy; intraoperative assessment of perfusion  She does have a apparent small ventral hernia on exam, she does report no prior abdominal or pelvic surgical history. Unclear if this is actually a primary ventral hernia but there are no scars overlying it to suggest that is anything else. We did discuss again today that we would likely plan to leave this alone at the time of surgery given its wide neck and small size. Could be pursued after recovery from all of this however.  -The planned procedure, material risks (including, but not limited to, pain, bleeding, infection, scarring, need for blood transfusion, damage to surrounding structures- blood vessels/nerves/viscus/organs, damage to ureter, urine leak, leak from anastomosis, need for additional procedures, low anterior resection syndrome (LARS) = increased fecal urgency and/or frequency, scenarios where a stoma may be necessary and where it may be permanent, worsening of pre-existing medical conditions, chronic diarrhea, constipation secondary to narcotic use, hernia, recurrence, pneumonia, heart attack, stroke, death) benefits and alternatives to surgery were discussed at length. The patient's questions were answered to her and her mother's satisfaction, they voiced understanding and elected to proceed with surgery. Additionally, we discussed typical postoperative expectations and the recovery process.   Lonni Pizza, MD [facility], [facility]

## 2023-04-21 ENCOUNTER — Encounter (HOSPITAL_COMMUNITY): Payer: Self-pay | Admitting: Surgery

## 2023-04-21 ENCOUNTER — Inpatient Hospital Stay (HOSPITAL_COMMUNITY)
Admission: RE | Admit: 2023-04-21 | Discharge: 2023-04-26 | DRG: 331 | Disposition: A | Discharge status: Home Health | Payer: Medicaid Other | Attending: Surgery | Admitting: Surgery

## 2023-04-21 ENCOUNTER — Encounter (HOSPITAL_COMMUNITY)
Admission: RE | Disposition: A | Discharge status: Home Health | Payer: Self-pay | Source: Home / Self Care | Attending: Surgery

## 2023-04-21 ENCOUNTER — Other Ambulatory Visit: Payer: Self-pay

## 2023-04-21 ENCOUNTER — Inpatient Hospital Stay (HOSPITAL_COMMUNITY): Payer: Medicaid Other | Admitting: Anesthesiology

## 2023-04-21 ENCOUNTER — Inpatient Hospital Stay (HOSPITAL_COMMUNITY): Payer: Medicaid Other | Admitting: Physician Assistant

## 2023-04-21 DIAGNOSIS — C20 Malignant neoplasm of rectum: Secondary | ICD-10-CM

## 2023-04-21 DIAGNOSIS — Z853 Personal history of malignant neoplasm of breast: Secondary | ICD-10-CM

## 2023-04-21 DIAGNOSIS — Z85038 Personal history of other malignant neoplasm of large intestine: Secondary | ICD-10-CM

## 2023-04-21 DIAGNOSIS — F32A Depression, unspecified: Secondary | ICD-10-CM | POA: Diagnosis present

## 2023-04-21 DIAGNOSIS — J449 Chronic obstructive pulmonary disease, unspecified: Secondary | ICD-10-CM | POA: Diagnosis present

## 2023-04-21 DIAGNOSIS — Z932 Ileostomy status: Principal | ICD-10-CM

## 2023-04-21 DIAGNOSIS — F419 Anxiety disorder, unspecified: Secondary | ICD-10-CM | POA: Diagnosis present

## 2023-04-21 DIAGNOSIS — J45909 Unspecified asthma, uncomplicated: Secondary | ICD-10-CM | POA: Diagnosis present

## 2023-04-21 DIAGNOSIS — Z803 Family history of malignant neoplasm of breast: Secondary | ICD-10-CM | POA: Diagnosis not present

## 2023-04-21 DIAGNOSIS — D6481 Anemia due to antineoplastic chemotherapy: Secondary | ICD-10-CM | POA: Diagnosis present

## 2023-04-21 DIAGNOSIS — I2510 Atherosclerotic heart disease of native coronary artery without angina pectoris: Secondary | ICD-10-CM | POA: Diagnosis present

## 2023-04-21 DIAGNOSIS — Z85118 Personal history of other malignant neoplasm of bronchus and lung: Secondary | ICD-10-CM | POA: Diagnosis not present

## 2023-04-21 DIAGNOSIS — I700 Atherosclerosis of aorta: Secondary | ICD-10-CM | POA: Diagnosis present

## 2023-04-21 DIAGNOSIS — Z01818 Encounter for other preprocedural examination: Secondary | ICD-10-CM

## 2023-04-21 DIAGNOSIS — K439 Ventral hernia without obstruction or gangrene: Secondary | ICD-10-CM | POA: Diagnosis present

## 2023-04-21 DIAGNOSIS — Z560 Unemployment, unspecified: Secondary | ICD-10-CM | POA: Diagnosis not present

## 2023-04-21 DIAGNOSIS — Z9011 Acquired absence of right breast and nipple: Secondary | ICD-10-CM

## 2023-04-21 DIAGNOSIS — J439 Emphysema, unspecified: Secondary | ICD-10-CM | POA: Diagnosis present

## 2023-04-21 DIAGNOSIS — Z9049 Acquired absence of other specified parts of digestive tract: Principal | ICD-10-CM

## 2023-04-21 DIAGNOSIS — Z808 Family history of malignant neoplasm of other organs or systems: Secondary | ICD-10-CM

## 2023-04-21 DIAGNOSIS — G620 Drug-induced polyneuropathy: Secondary | ICD-10-CM | POA: Diagnosis present

## 2023-04-21 DIAGNOSIS — T451X5A Adverse effect of antineoplastic and immunosuppressive drugs, initial encounter: Secondary | ICD-10-CM | POA: Diagnosis present

## 2023-04-21 DIAGNOSIS — K219 Gastro-esophageal reflux disease without esophagitis: Secondary | ICD-10-CM | POA: Diagnosis present

## 2023-04-21 DIAGNOSIS — F17210 Nicotine dependence, cigarettes, uncomplicated: Secondary | ICD-10-CM | POA: Diagnosis present

## 2023-04-21 DIAGNOSIS — Z9221 Personal history of antineoplastic chemotherapy: Secondary | ICD-10-CM | POA: Diagnosis not present

## 2023-04-21 HISTORY — PX: FLEXIBLE SIGMOIDOSCOPY: SHX5431

## 2023-04-21 HISTORY — PX: XI ROBOTIC ASSISTED LOWER ANTERIOR RESECTION: SHX6558

## 2023-04-21 LAB — CBC
HCT: 40.1 % (ref 36.0–46.0)
Hemoglobin: 13.6 g/dL (ref 12.0–15.0)
MCH: 32.8 pg (ref 26.0–34.0)
MCHC: 33.9 g/dL (ref 30.0–36.0)
MCV: 96.6 fL (ref 80.0–100.0)
Platelets: 192 10*3/uL (ref 150–400)
RBC: 4.15 MIL/uL (ref 3.87–5.11)
RDW: 15.1 % (ref 11.5–15.5)
WBC: 10.9 10*3/uL — ABNORMAL HIGH (ref 4.0–10.5)
nRBC: 0 % (ref 0.0–0.2)

## 2023-04-21 LAB — TYPE AND SCREEN
ABO/RH(D): O POS
Antibody Screen: NEGATIVE

## 2023-04-21 LAB — CREATININE, SERUM
Creatinine, Ser: 0.88 mg/dL (ref 0.44–1.00)
GFR, Estimated: >60 mL/min (ref >60)

## 2023-04-21 SURGERY — RESECTION, RECTUM, LOW ANTERIOR, ROBOT-ASSISTED
Anesthesia: General

## 2023-04-21 MED ORDER — ACETAMINOPHEN 500 MG PO TABS
1000.0000 mg | ORAL_TABLET | Freq: Four times a day (QID) | ORAL | Status: DC
Start: 2023-04-21 — End: 2023-04-28
  Administered 2023-04-21 – 2023-04-26 (×14): 1000 mg via ORAL
  Filled 2023-04-21 (×17): qty 2

## 2023-04-21 MED ORDER — MAGNESIUM CHLORIDE 64 MG PO TBEC
1.0000 | DELAYED_RELEASE_TABLET | Freq: Two times a day (BID) | ORAL | Status: DC
Start: 2023-04-21 — End: 2023-04-26
  Administered 2023-04-22 – 2023-04-25 (×8): 64 mg via ORAL
  Filled 2023-04-21 (×10): qty 1

## 2023-04-21 MED ORDER — ACETAMINOPHEN 500 MG PO TABS: 1000.0000 mg | ORAL_TABLET | ORAL | Status: DC

## 2023-04-21 MED ORDER — HYDROMORPHONE HCL 1 MG/ML IJ SOLN
0.5000 mg | INTRAMUSCULAR | Status: DC | PRN
  Administered 2023-04-22 – 2023-04-25 (×3): 0.5 mg via INTRAVENOUS
  Filled 2023-04-21 (×3): qty 0.5

## 2023-04-21 MED ORDER — CALCIUM CARBONATE ANTACID 500 MG PO CHEW: 2.0000 | CHEWABLE_TABLET | Freq: Four times a day (QID) | ORAL | Status: DC | PRN

## 2023-04-21 MED ORDER — CHLORHEXIDINE GLUCONATE 0.12 % MT SOLN
15.0000 mL | Freq: Once | OROMUCOSAL | Status: AC
  Administered 2023-04-21: 15 mL via OROMUCOSAL

## 2023-04-21 MED ORDER — PROPOFOL 10 MG/ML IV BOLUS
INTRAVENOUS | Status: AC
  Filled 2023-04-21: qty 20

## 2023-04-21 MED ORDER — BISACODYL 5 MG PO TBEC: 20.0000 mg | DELAYED_RELEASE_TABLET | Freq: Once | ORAL | Status: DC

## 2023-04-21 MED ORDER — POLYETHYLENE GLYCOL 3350 17 GM/SCOOP PO POWD: 1.0000 | Freq: Once | ORAL | Status: DC

## 2023-04-21 MED ORDER — HEPARIN SODIUM (PORCINE) 5000 UNIT/ML IJ SOLN
5000.0000 [IU] | Freq: Once | INTRAMUSCULAR | Status: AC
  Administered 2023-04-21: 5000 [IU] via SUBCUTANEOUS
  Filled 2023-04-21: qty 1

## 2023-04-21 MED ORDER — FLUTICASONE PROPIONATE 50 MCG/ACT NA SUSP
1.0000 | Freq: Every day | NASAL | Status: DC
  Administered 2023-04-22 – 2023-04-26 (×5): 1 via NASAL
  Filled 2023-04-21 (×2): qty 16

## 2023-04-21 MED ORDER — LIDOCAINE HCL (CARDIAC) PF 100 MG/5ML IV SOSY
PREFILLED_SYRINGE | INTRAVENOUS | Status: DC | PRN
  Administered 2023-04-21: 40 mg via INTRAVENOUS

## 2023-04-21 MED ORDER — ALBUTEROL SULFATE (2.5 MG/3ML) 0.083% IN NEBU: 2.5000 mg | INHALATION_SOLUTION | Freq: Four times a day (QID) | RESPIRATORY_TRACT | Status: DC | PRN

## 2023-04-21 MED ORDER — FENTANYL CITRATE (PF) 100 MCG/2ML IJ SOLN
INTRAMUSCULAR | Status: AC
  Filled 2023-04-21: qty 2

## 2023-04-21 MED ORDER — ALVIMOPAN 12 MG PO CAPS
12.0000 mg | ORAL_CAPSULE | ORAL | Status: AC
  Administered 2023-04-21: 12 mg via ORAL
  Filled 2023-04-21: qty 1

## 2023-04-21 MED ORDER — INDOCYANINE GREEN 25 MG IV SOLR
INTRAVENOUS | Status: DC | PRN
  Administered 2023-04-21: 2.5 mg via INTRAVENOUS

## 2023-04-21 MED ORDER — ALBUMIN HUMAN 5 % IV SOLN
INTRAVENOUS | Status: AC
  Filled 2023-04-21: qty 500

## 2023-04-21 MED ORDER — ONDANSETRON HCL 4 MG/2ML IJ SOLN
INTRAMUSCULAR | Status: DC | PRN
  Administered 2023-04-21: 4 mg via INTRAVENOUS

## 2023-04-21 MED ORDER — BUPIVACAINE LIPOSOME 1.3 % IJ SUSP
INTRAMUSCULAR | Status: AC
  Filled 2023-04-21: qty 20

## 2023-04-21 MED ORDER — ALVIMOPAN 12 MG PO CAPS
12.0000 mg | ORAL_CAPSULE | Freq: Two times a day (BID) | ORAL | Status: DC
Start: 2023-04-22 — End: 2023-04-29
  Filled 2023-04-21: qty 1

## 2023-04-21 MED ORDER — BUPIVACAINE-EPINEPHRINE 0.25% -1:200000 IJ SOLN
INTRAMUSCULAR | Status: AC
  Filled 2023-04-21: qty 1

## 2023-04-21 MED ORDER — HEPARIN SODIUM (PORCINE) 5000 UNIT/ML IJ SOLN
5000.0000 [IU] | Freq: Three times a day (TID) | INTRAMUSCULAR | Status: DC
  Administered 2023-04-21 – 2023-04-26 (×13): 5000 [IU] via SUBCUTANEOUS
  Filled 2023-04-21 (×14): qty 1

## 2023-04-21 MED ORDER — NEOMYCIN SULFATE 500 MG PO TABS: 1000.0000 mg | ORAL_TABLET | ORAL | Status: DC

## 2023-04-21 MED ORDER — PANTOPRAZOLE SODIUM 40 MG PO TBEC
40.0000 mg | DELAYED_RELEASE_TABLET | Freq: Every day | ORAL | Status: DC
  Administered 2023-04-22 – 2023-04-26 (×5): 40 mg via ORAL
  Filled 2023-04-21 (×5): qty 1

## 2023-04-21 MED ORDER — POTASSIUM CHLORIDE CRYS ER 10 MEQ PO TBCR
10.0000 meq | EXTENDED_RELEASE_TABLET | Freq: Every day | ORAL | Status: DC
  Administered 2023-04-22 – 2023-04-26 (×5): 10 meq via ORAL
  Filled 2023-04-21 (×5): qty 1

## 2023-04-21 MED ORDER — MOMETASONE FURO-FORMOTEROL FUM 200-5 MCG/ACT IN AERO
2.0000 | INHALATION_SPRAY | Freq: Two times a day (BID) | RESPIRATORY_TRACT | Status: DC
  Administered 2023-04-22 – 2023-04-26 (×9): 2 via RESPIRATORY_TRACT
  Filled 2023-04-21 (×2): qty 8.8

## 2023-04-21 MED ORDER — DICYCLOMINE HCL 10 MG PO CAPS
10.0000 mg | ORAL_CAPSULE | Freq: Three times a day (TID) | ORAL | Status: DC
  Administered 2023-04-21 – 2023-04-26 (×18): 10 mg via ORAL
  Filled 2023-04-21 (×18): qty 1

## 2023-04-21 MED ORDER — DIPHENHYDRAMINE HCL 12.5 MG/5ML PO ELIX
12.5000 mg | ORAL_SOLUTION | Freq: Four times a day (QID) | ORAL | Status: DC | PRN
Start: 2023-04-21 — End: 2023-04-26

## 2023-04-21 MED ORDER — LACTATED RINGERS IV SOLN: INTRAVENOUS | Status: AC

## 2023-04-21 MED ORDER — HYDROMORPHONE HCL 1 MG/ML IJ SOLN
INTRAMUSCULAR | Status: AC
  Filled 2023-04-21: qty 1

## 2023-04-21 MED ORDER — BUPIVACAINE-EPINEPHRINE (PF) 0.25% -1:200000 IJ SOLN
INTRAMUSCULAR | Status: DC | PRN
  Administered 2023-04-21: 30 mL

## 2023-04-21 MED ORDER — ALUM & MAG HYDROXIDE-SIMETH 200-200-20 MG/5ML PO SUSP: 30.0000 mL | Freq: Four times a day (QID) | ORAL | Status: DC | PRN

## 2023-04-21 MED ORDER — ENSURE PRE-SURGERY PO LIQD: 296.0000 mL | Freq: Once | ORAL | Status: DC

## 2023-04-21 MED ORDER — DEXAMETHASONE SODIUM PHOSPHATE 10 MG/ML IJ SOLN
INTRAMUSCULAR | Status: DC | PRN
  Administered 2023-04-21: 10 mg via INTRAVENOUS

## 2023-04-21 MED ORDER — FENTANYL CITRATE (PF) 250 MCG/5ML IJ SOLN
INTRAMUSCULAR | Status: AC
  Filled 2023-04-21: qty 5

## 2023-04-21 MED ORDER — MIDAZOLAM HCL 2 MG/2ML IJ SOLN
INTRAMUSCULAR | Status: AC
  Filled 2023-04-21: qty 2

## 2023-04-21 MED ORDER — LACTATED RINGERS IV SOLN: INTRAVENOUS | Status: DC

## 2023-04-21 MED ORDER — PROPOFOL 10 MG/ML IV BOLUS
INTRAVENOUS | Status: DC | PRN
  Administered 2023-04-21: 100 mg via INTRAVENOUS
  Administered 2023-04-21: 20 mg via INTRAVENOUS

## 2023-04-21 MED ORDER — DIPHENHYDRAMINE HCL 50 MG/ML IJ SOLN
12.5000 mg | Freq: Four times a day (QID) | INTRAMUSCULAR | Status: DC | PRN
Start: 2023-04-21 — End: 2023-04-26

## 2023-04-21 MED ORDER — ACETAMINOPHEN 500 MG PO TABS
1000.0000 mg | ORAL_TABLET | Freq: Once | ORAL | Status: AC
  Administered 2023-04-21: 1000 mg via ORAL
  Filled 2023-04-21: qty 2

## 2023-04-21 MED ORDER — SUGAMMADEX SODIUM 200 MG/2ML IV SOLN
INTRAVENOUS | Status: DC | PRN
  Administered 2023-04-21: 200 mg via INTRAVENOUS

## 2023-04-21 MED ORDER — KETAMINE HCL 10 MG/ML IJ SOLN
INTRAMUSCULAR | Status: DC | PRN
  Administered 2023-04-21: 10 mg via INTRAVENOUS
  Administered 2023-04-21: 20 mg via INTRAVENOUS

## 2023-04-21 MED ORDER — HYDROMORPHONE HCL 1 MG/ML IJ SOLN
0.2500 mg | INTRAMUSCULAR | Status: DC | PRN
  Administered 2023-04-21 (×4): 0.5 mg via INTRAVENOUS

## 2023-04-21 MED ORDER — HYDRALAZINE HCL 20 MG/ML IJ SOLN: 10.0000 mg | INTRAMUSCULAR | Status: DC | PRN

## 2023-04-21 MED ORDER — 0.9 % SODIUM CHLORIDE (POUR BTL) OPTIME
TOPICAL | Status: DC | PRN
  Administered 2023-04-21: 2000 mL

## 2023-04-21 MED ORDER — LIDOCAINE HCL (PF) 2 % IJ SOLN
INTRAMUSCULAR | Status: DC | PRN
  Administered 2023-04-21: 1.5 mg/kg/h

## 2023-04-21 MED ORDER — DROPERIDOL 2.5 MG/ML IJ SOLN
INTRAMUSCULAR | Status: AC
  Filled 2023-04-21: qty 2

## 2023-04-21 MED ORDER — BUPIVACAINE LIPOSOME 1.3 % IJ SUSP: 20.0000 mL | Freq: Once | INTRAMUSCULAR | Status: DC

## 2023-04-21 MED ORDER — CHLORHEXIDINE GLUCONATE CLOTH 2 % EX PADS: 6.0000 | MEDICATED_PAD | Freq: Once | CUTANEOUS | Status: DC

## 2023-04-21 MED ORDER — METRONIDAZOLE 500 MG PO TABS: 1000.0000 mg | ORAL_TABLET | ORAL | Status: DC

## 2023-04-21 MED ORDER — BUPIVACAINE LIPOSOME 1.3 % IJ SUSP
INTRAMUSCULAR | Status: DC | PRN
  Administered 2023-04-21: 20 mL

## 2023-04-21 MED ORDER — SODIUM CHLORIDE 0.9 % IV SOLN
2.0000 g | INTRAVENOUS | Status: AC
  Administered 2023-04-21: 2 g via INTRAVENOUS
  Filled 2023-04-21: qty 2

## 2023-04-21 MED ORDER — ORAL CARE MOUTH RINSE: 15.0000 mL | Freq: Once | OROMUCOSAL | Status: AC

## 2023-04-21 MED ORDER — ENSURE SURGERY PO LIQD
237.0000 mL | Freq: Two times a day (BID) | ORAL | Status: DC
  Administered 2023-04-23 – 2023-04-26 (×6): 237 mL via ORAL

## 2023-04-21 MED ORDER — DROPERIDOL 2.5 MG/ML IJ SOLN
0.6250 mg | Freq: Once | INTRAMUSCULAR | Status: AC | PRN
  Administered 2023-04-21: 0.625 mg via INTRAVENOUS

## 2023-04-21 MED ORDER — ENSURE PRE-SURGERY PO LIQD: 592.0000 mL | Freq: Once | ORAL | Status: DC

## 2023-04-21 MED ORDER — ALBUTEROL SULFATE HFA 108 (90 BASE) MCG/ACT IN AERS: 2.0000 | INHALATION_SPRAY | Freq: Four times a day (QID) | RESPIRATORY_TRACT | Status: DC | PRN

## 2023-04-21 MED ORDER — PHENYLEPHRINE HCL (PRESSORS) 10 MG/ML IV SOLN: INTRAVENOUS | Status: DC | PRN

## 2023-04-21 MED ORDER — IBUPROFEN 400 MG PO TABS
600.0000 mg | ORAL_TABLET | Freq: Four times a day (QID) | ORAL | Status: DC | PRN
Start: 2023-04-21 — End: 2023-04-26

## 2023-04-21 MED ORDER — LIDOCAINE HCL 2 % IJ SOLN
INTRAMUSCULAR | Status: AC
  Filled 2023-04-21: qty 20

## 2023-04-21 MED ORDER — UMECLIDINIUM BROMIDE 62.5 MCG/ACT IN AEPB
1.0000 | INHALATION_SPRAY | Freq: Every day | RESPIRATORY_TRACT | Status: DC
  Administered 2023-04-22 – 2023-04-26 (×5): 1 via RESPIRATORY_TRACT
  Filled 2023-04-21: qty 7

## 2023-04-21 MED ORDER — MIDAZOLAM HCL 5 MG/5ML IJ SOLN
INTRAMUSCULAR | Status: DC | PRN
  Administered 2023-04-21: 2 mg via INTRAVENOUS

## 2023-04-21 MED ORDER — KETAMINE HCL 50 MG/5ML IJ SOSY
PREFILLED_SYRINGE | INTRAMUSCULAR | Status: AC
  Filled 2023-04-21: qty 5

## 2023-04-21 MED ORDER — TIOTROPIUM BROMIDE MONOHYDRATE 2.5 MCG/ACT IN AERS: 2.0000 | INHALATION_SPRAY | Freq: Every day | RESPIRATORY_TRACT | Status: DC

## 2023-04-21 MED ORDER — SIMETHICONE 80 MG PO CHEW
40.0000 mg | CHEWABLE_TABLET | Freq: Four times a day (QID) | ORAL | Status: DC | PRN
Start: 2023-04-21 — End: 2023-04-26

## 2023-04-21 MED ORDER — ROCURONIUM BROMIDE 100 MG/10ML IV SOLN
INTRAVENOUS | Status: DC | PRN
  Administered 2023-04-21: 50 mg via INTRAVENOUS
  Administered 2023-04-21: 20 mg via INTRAVENOUS

## 2023-04-21 MED ORDER — PROCHLORPERAZINE EDISYLATE 10 MG/2ML IJ SOLN
10.0000 mg | Freq: Four times a day (QID) | INTRAMUSCULAR | Status: DC | PRN
  Administered 2023-04-21 – 2023-04-26 (×3): 10 mg via INTRAVENOUS
  Filled 2023-04-21 (×3): qty 2

## 2023-04-21 MED ORDER — LACTATED RINGERS IR SOLN
Status: DC | PRN
  Administered 2023-04-21: 1000 mL

## 2023-04-21 MED ORDER — PHENYLEPHRINE 80 MCG/ML (10ML) SYRINGE FOR IV PUSH (FOR BLOOD PRESSURE SUPPORT)
PREFILLED_SYRINGE | INTRAVENOUS | Status: DC | PRN
  Administered 2023-04-21: 80 ug via INTRAVENOUS
  Administered 2023-04-21 (×2): 160 ug via INTRAVENOUS

## 2023-04-21 MED ORDER — FENTANYL CITRATE (PF) 100 MCG/2ML IJ SOLN
INTRAMUSCULAR | Status: DC | PRN
  Administered 2023-04-21: 100 ug via INTRAVENOUS
  Administered 2023-04-21 (×4): 50 ug via INTRAVENOUS

## 2023-04-21 MED ORDER — OXYCODONE HCL 5 MG PO TABS
5.0000 mg | ORAL_TABLET | Freq: Four times a day (QID) | ORAL | Status: DC | PRN
  Administered 2023-04-22: 5 mg via ORAL
  Administered 2023-04-22: 10 mg via ORAL
  Administered 2023-04-23 (×2): 5 mg via ORAL
  Administered 2023-04-25 (×3): 10 mg via ORAL
  Filled 2023-04-21: qty 2
  Filled 2023-04-21 (×2): qty 1
  Filled 2023-04-21: qty 2
  Filled 2023-04-21: qty 1
  Filled 2023-04-21 (×2): qty 2

## 2023-04-21 SURGICAL SUPPLY — 103 items
APPLIER CLIP 5 13 M/L LIGAMAX5 (MISCELLANEOUS)
APPLIER CLIP ROT 10 11.4 M/L (STAPLE)
BAG COUNTER SPONGE SURGICOUNT (BAG) IMPLANT
BLADE EXTENDED COATED 6.5IN (ELECTRODE) ×1 IMPLANT
CANNULA REDUCER 12-8 DVNC XI (CANNULA) ×1 IMPLANT
CELLS DAT CNTRL 66122 CELL SVR (MISCELLANEOUS) IMPLANT
CHLORAPREP W/TINT 26 (MISCELLANEOUS) ×1 IMPLANT
CLIP APPLIE 5 13 M/L LIGAMAX5 (MISCELLANEOUS) IMPLANT
CLIP APPLIE ROT 10 11.4 M/L (STAPLE) IMPLANT
CLIP LIGATING HEM O LOK PURPLE (MISCELLANEOUS) IMPLANT
CLIP LIGATING HEMO O LOK GREEN (MISCELLANEOUS) IMPLANT
COVER SURGICAL LIGHT HANDLE (MISCELLANEOUS) ×2 IMPLANT
COVER TIP SHEARS 8 DVNC (MISCELLANEOUS) ×1 IMPLANT
DEFOGGER SCOPE WARMER CLEARIFY (MISCELLANEOUS) ×1 IMPLANT
DERMABOND ADVANCED .7 DNX12 (GAUZE/BANDAGES/DRESSINGS) IMPLANT
DEVICE TROCAR PUNCTURE CLOSURE (ENDOMECHANICALS) IMPLANT
DRAIN CHANNEL 19F RND (DRAIN) ×1 IMPLANT
DRAPE ARM DVNC X/XI (DISPOSABLE) ×4 IMPLANT
DRAPE COLUMN DVNC XI (DISPOSABLE) ×1 IMPLANT
DRAPE SURG IRRIG POUCH 19X23 (DRAPES) ×1 IMPLANT
DRIVER NDL LRG 8 DVNC XI (INSTRUMENTS) ×1 IMPLANT
DRIVER NDLE LRG 8 DVNC XI (INSTRUMENTS) IMPLANT
DRSG OPSITE POSTOP 4X10 (GAUZE/BANDAGES/DRESSINGS) IMPLANT
DRSG OPSITE POSTOP 4X6 (GAUZE/BANDAGES/DRESSINGS) IMPLANT
DRSG OPSITE POSTOP 4X8 (GAUZE/BANDAGES/DRESSINGS) IMPLANT
DRSG TEGADERM 2-3/8X2-3/4 SM (GAUZE/BANDAGES/DRESSINGS) ×5 IMPLANT
DRSG TEGADERM 4X4.75 (GAUZE/BANDAGES/DRESSINGS) ×1 IMPLANT
ELECT REM PT RETURN 15FT ADLT (MISCELLANEOUS) ×1 IMPLANT
ENDOLOOP SUT PDS II 0 18 (SUTURE) IMPLANT
EVACUATOR SILICONE 100CC (DRAIN) ×1 IMPLANT
FORCEPS BPLR FENES DVNC XI (FORCEP) IMPLANT
GAUZE SPONGE 2X2 8PLY STRL LF (GAUZE/BANDAGES/DRESSINGS) ×1 IMPLANT
GAUZE SPONGE 4X4 12PLY STRL (GAUZE/BANDAGES/DRESSINGS) IMPLANT
GLOVE BIO SURGEON STRL SZ7.5 (GLOVE) ×3 IMPLANT
GLOVE INDICATOR 8.0 STRL GRN (GLOVE) ×3 IMPLANT
GOWN SRG XL LVL 4 BRTHBL STRL (GOWNS) ×1 IMPLANT
GOWN STRL REUS W/ TWL XL LVL3 (GOWN DISPOSABLE) ×5 IMPLANT
GRASPER SUT TROCAR 14GX15 (MISCELLANEOUS) IMPLANT
GRASPER TIP-UP FEN DVNC XI (INSTRUMENTS) ×1 IMPLANT
HOLDER FOLEY CATH W/STRAP (MISCELLANEOUS) ×1 IMPLANT
IRRIG SUCT STRYKERFLOW 2 WTIP (MISCELLANEOUS) ×1
IRRIGATION SUCT STRKRFLW 2 WTP (MISCELLANEOUS) ×1 IMPLANT
KIT PROCEDURE DVNC SI (MISCELLANEOUS) IMPLANT
KIT TURNOVER KIT A (KITS) IMPLANT
NDL INSUFFLATION 14GA 120MM (NEEDLE) ×1 IMPLANT
NEEDLE INSUFFLATION 14GA 120MM (NEEDLE) ×1 IMPLANT
PACK CARDIOVASCULAR III (CUSTOM PROCEDURE TRAY) ×1 IMPLANT
PACK COLON (CUSTOM PROCEDURE TRAY) ×1 IMPLANT
PAD POSITIONING PINK XL (MISCELLANEOUS) ×1 IMPLANT
PENCIL SMOKE EVACUATOR (MISCELLANEOUS) IMPLANT
PROTECTOR NERVE ULNAR (MISCELLANEOUS) ×2 IMPLANT
RELOAD STAPLE 45 3.5 BLU DVNC (STAPLE) IMPLANT
RELOAD STAPLE 45 4.3 GRN DVNC (STAPLE) IMPLANT
RELOAD STAPLE 60 3.5 BLU DVNC (STAPLE) IMPLANT
RELOAD STAPLE 60 4.3 GRN DVNC (STAPLE) IMPLANT
RELOAD STAPLER 3.5X45 BLU DVNC (STAPLE) IMPLANT
RELOAD STAPLER 3.5X60 BLU DVNC (STAPLE) IMPLANT
RELOAD STAPLER 4.3X45 GRN DVNC (STAPLE) IMPLANT
RELOAD STAPLER 4.3X60 GRN DVNC (STAPLE) ×2 IMPLANT
RETRACTOR WND ALEXIS 18 MED (MISCELLANEOUS) IMPLANT
RTRCTR WOUND ALEXIS 18CM MED (MISCELLANEOUS)
SCISSORS LAP 5X35 DISP (ENDOMECHANICALS) IMPLANT
SCISSORS MNPLR CVD DVNC XI (INSTRUMENTS) ×1 IMPLANT
SEAL UNIV 5-12 XI (MISCELLANEOUS) ×4 IMPLANT
SEALER VESSEL EXT DVNC XI (MISCELLANEOUS) ×1 IMPLANT
SLEEVE ADV FIXATION 5X100MM (TROCAR) IMPLANT
SOL ELECTROSURG ANTI STICK (MISCELLANEOUS) ×1
SOLUTION ELECTROSURG ANTI STCK (MISCELLANEOUS) ×1 IMPLANT
SPIKE FLUID TRANSFER (MISCELLANEOUS) ×1 IMPLANT
STAPLER 60 SUREFORM DVNC (STAPLE) IMPLANT
STAPLER ECHELON POWER CIR 29 (STAPLE) IMPLANT
STAPLER ECHELON POWER CIR 31 (STAPLE) IMPLANT
STAPLER RELOAD 3.5X45 BLU DVNC (STAPLE)
STAPLER RELOAD 3.5X60 BLU DVNC (STAPLE)
STAPLER RELOAD 4.3X45 GRN DVNC (STAPLE)
STAPLER RELOAD 4.3X60 GRN DVNC (STAPLE) ×2
STOPCOCK 4 WAY LG BORE MALE ST (IV SETS) ×2 IMPLANT
SURGILUBE 2OZ TUBE FLIPTOP (MISCELLANEOUS) ×1 IMPLANT
SUT MNCRL AB 4-0 PS2 18 (SUTURE) ×1 IMPLANT
SUT PDS AB 1 CT1 27 (SUTURE) IMPLANT
SUT PDS AB 1 TP1 96 (SUTURE) IMPLANT
SUT PROLENE 0 CT 2 (SUTURE) IMPLANT
SUT PROLENE 2 0 KS (SUTURE) ×1 IMPLANT
SUT PROLENE 2 0 SH DA (SUTURE) IMPLANT
SUT SILK 2 0 SH CR/8 (SUTURE) IMPLANT
SUT SILK 2-0 18XBRD TIE 12 (SUTURE) IMPLANT
SUT SILK 3 0 SH CR/8 (SUTURE) ×1 IMPLANT
SUT SILK 3-0 18XBRD TIE 12 (SUTURE) ×1 IMPLANT
SUT V-LOC BARB 180 2/0GR6 GS22 (SUTURE)
SUT VIC AB 3-0 SH 18 (SUTURE) IMPLANT
SUT VIC AB 3-0 SH 27XBRD (SUTURE) IMPLANT
SUT VICRYL 0 UR6 27IN ABS (SUTURE) ×1 IMPLANT
SUTURE V-LC BRB 180 2/0GR6GS22 (SUTURE) IMPLANT
SYR 10ML LL (SYRINGE) ×1 IMPLANT
SYS LAPSCP GELPORT 120MM (MISCELLANEOUS)
SYS WOUND ALEXIS 18CM MED (MISCELLANEOUS) ×1
SYSTEM LAPSCP GELPORT 120MM (MISCELLANEOUS) IMPLANT
SYSTEM WOUND ALEXIS 18CM MED (MISCELLANEOUS) ×1 IMPLANT
TAPE UMBILICAL 1/8 X36 TWILL (MISCELLANEOUS) ×1 IMPLANT
TRAY FOLEY MTR SLVR 16FR STAT (SET/KITS/TRAYS/PACK) ×1 IMPLANT
TROCAR ADV FIXATION 5X100MM (TROCAR) ×1 IMPLANT
TUBING CONNECTING 10 (TUBING) ×3 IMPLANT
TUBING INSUFFLATION 10FT LAP (TUBING) ×1 IMPLANT

## 2023-04-21 NOTE — Op Note (Signed)
 PATIENT: Yvonne Scott  63 y.o. female  Patient Care Team: Carin Gauze, NP as PCP - General (Cardiology) Babara Call, MD as Consulting Physician (Oncology)  PREOP DIAGNOSIS: RECTAL CANCER  POSTOP DIAGNOSIS: RECTAL CANCER  PROCEDURE:  Robotic assisted low anterior resection with diverting loop ileostomy Diagnostic flexible sigmoidoscopy-necessary to confirm location of mass Intraoperative assessment of perfusion using ICG fluorescence imaging Bilateral transversus abdominus plane (TAP) blocks  SURGEON: Lonni HERO. Breena Bevacqua, MD  ASSISTANT: Bernarda Ned, MD  An experienced assistant was required given the complexity of this procedure and the standard of surgical care. My assistant helped with exposure through counter tension, suctioning, ligation and retraction to better visualize the surgical field. My assistant expedited sewing during the case by following my sutures. Wherever I use the term we in the report, my assistant actively helped me with that portion of the procedure.   ANESTHESIA: General endotracheal  EBL: 50 mL Total I/O In: 1400 [I.V.:1300; IV Piggyback:100] Out: 170 [Urine:120; Blood:50]  DRAINS: none  SPECIMEN:  Rectum and distal sigmoid colon-staple line is distal, entire opened and proximal Distal anastomotic donut  COUNTS: Sponge, needle and instrument counts were reported correct x2  FINDINGS: Normal peritoneal surfaces.  Normal surface of liver.  Tattoo involving much of the lower rectum.  No full-thickness extension of any sort of mass to the wall of the rectum.  Flexible sigmoidoscopy was utilized to confirm the exact location of the lesion to ensure we had an appropriate distal margin below the tumor. A well perfused, tension free, hemostatic, air tight 29 mm EEA colorectal anastomosis fashioned 3-4 cm from the anal verge by flexible sigmoidoscopy.   NARRATIVE: Informed consent was verified. The patient was taken to the operating room, placed  supine on the operating table and SCD's were applied. General endotracheal anesthesia was induced without difficulty.  She was then positioned in the lithotomy position with Allen stirrups.  Pressure points were evaluated and padded.  A foley catheter was then placed by nursing under sterile conditions. Hair on the abdomen was clipped.  She was secured to the operating table. The abdomen was then prepped and draped in the standard sterile fashion. Surgical timeout was called indicating the correct patient, procedure, positioning and need for preoperative antibiotics.   An OG tube was placed by anesthesia and confirmed to be to suction.  At Palmer's point, a stab incision was created and the Veress needle was introduced into the peritoneal cavity on the first attempt.  Intraperitoneal location was confirmed by the aspiration and saline drop test.  Pneumoperitoneum was established to a maximum pressure of 15 mmHg using CO2.  Following this, the abdomen was marked for planned trocar sites.  Just to the right and cephalad to the umbilicus, an 8 mm incision was created and an 8 mm blunt tipped robotic trocar was cautiously placed into the peritoneal cavity.  The laparoscope was inserted and demonstrated no evidence of trocar site nor Veress needle site complications.  The Veress needle was removed.  Bilateral transversus abdominis plane blocks were then created using a dilute mixture of Exparel  with Marcaine .  3 additional 8 mm robotic trochars were placed under direct visualization roughly in a line extending from the right ASIS towards the left upper quadrant. The bladder was inspected and noted to be at/below the pubic symphysis.  Staying 3 fingerbreadths above the pubic symphysis, an incision was created and the 12 mm robotic trocar inserted directed cephalad into the peritoneal cavity under direct visualization.  An additional  5 mm assist port was placed in the right lateral abdomen under direct visualization.   The abdomen was surveyed and there was small preperitoneal fat-containing umbilical hernia.  Peritoneal surfaces are normal.  Surface of liver is normal.  She was positioned in Trendelenburg with the left side tilted slightly up.  Small bowel was carefully retracted out of the pelvis.  The robot was then docked and I went to the console.  When we retract her small atrophic uterus anteriorly, we are able to visualize tattoo at the level of the peritoneal reflection.  The sigmoid colon was readily identified.  Attachments of the sigmoid colon were taken down from the intersigmoid fossa.  The rectosigmoid colon was grasped and elevated anteriorly.  Beginning with a medial to lateral approach, the peritoneum overlying the presacral space was carefully incised.  The TME plane was readily gained working in a plane between the fascia propria of the rectum and the presacral fascia.  Hypogastric nerves were seen going along the the presacral fascia and were protected free of injury.  Working more proximally, the mesorectum and sigmoid mesentery were carefully mobilized off of the peritoneum.  The left ureter was identified and protected free of injury.  The left gonadal vessels were identified and protected.  These were both swept down.  The superior hemorrhoidal and IMA pedicles were identified. Further mesocolon was mobilized proximally staying in this plane between the retroperitoneum proper and the mesocolon. Attention was then turned to the lateral portion of dissection.  The sigmoid colon was then retracted to the right.  The sigmoid colon was fully mobilized. The descending colon was mobilized by incising the Cesare Sumlin line of Toldt.  This was done all the way up to the level of the splenic flexure.  She has quite a lot of laxity and it became clear that we had more than adequate length mobilized for this to reach into the deep pelvis without any trouble.  The associated mesocolon was also mobilized medially.  The  left ureter again was confirmed to be well away from the vasculature which had been dissected medially.  The rectosigmoid colon was elevated anteriorly. The left ureter was re-identified. The IMA was clear of this. The IMA was then divided with the vessel sealer. The stump was inspected and noted to be completely hemostatic with a good seal.  The mesentery was divided out to the point of planned proximal division.  Working more distally, the rectum was reidentified.  We continued our TME level dissection posteriorly first stay between the fascia propria the rectum and the presacral fascia.  This was carried all the way into the deep pelvis.  Posteriorly, she does have a significant amount of tattoo going all the way down to the pelvic floor.  After dissecting things out posteriorly, we then commenced with a lateral dissection.  The peritoneum was incised more anteriorly and this plane is maintained for anterior portion of the dissection separating the rectovaginal septum out.  There was a significant amount tattoo staining it was not clear exactly where the distal edge of this lesion was.  Therefore, we opted to proceed with a flexible sigmoidoscopy.  I did a digital rectal exam which demonstrates the beginnings of a palpable mass in the mid to distal rectum. My assistant was able to gently occlude the proximal rectum to allow appropriate distention. The flexible sigmoidoscope was inserted to the anal canal.  With the rectum distended and additionally with the visualization of the rectum from our laparoscopic  approach, we are able to identify the location of the lesion as well as we would be an appropriate distal margin.  The lesion is in the mid to distal rectum occupying the lowermost fold of the valve of [place].  Given this location, we opted to aim for at least a 1 cm distal margin.  This area was able to be marked and noted laparoscopic approach as well.  The flexible sigmoidoscope was then withdrawn. A  vaginal exam was also performed digitally to ensure no injuries to the posterior wall of the vagina of which there are none. I went back to the console.  At the level of where we had marked this distal area for our planned transection, the mesorectum was cleared and a tumor-specific total mesorectal excision manner.  This was done using the vessel sealer.  A 60 mm green load robotic stapler was then placed through the 12 mm port and introduced into the peritoneal cavity.  The rectum was divided with 2 firings of the stapler.  The stump was intact and healthy in appearance with well-formed staples.   Attention was turned to performing a perfusion test. ICG was administered by anesthesia and at the level of the cleared mesentery proximally, there was excellent uptake of the tracer.  The rectum was also well perfused in appearance.  There was a visible pulse in the mesentery out to the level of the cleared colon at the level of the proximal sigmoid/descending colon junction.  This colon is also supple and healthy in appearance without any thickening.  This reached into the pelvis without any difficulty and remained in that location without any tension.  Given the location of this lesion, we are also concerned that this would be well below the 5 cm mark and at a location particularly given her smoking history recently that would put her at an increased risk for anastomotic complication/leak and therefore high likelihood for an ileostomy.  The cecum and appendix were identified and healthy in appearance.  The terminal ileum was identified.  We are able to free a few of the congenital terminal ileal attachments from the right lower quadrant and in doing so have more than adequate reach of the distal ileum to the premarked ileostomy site.  A locking grasper was then placed on the sigmoid staple line.   Attention was turned to the extracorporeal portion of the procedure.  The robot was undocked.  I scrubbed back  in.  Using the 12 mm trocar site, a Pfannenstiel incision was created and incorporated the fascial opening through the 12 mm port site.  The rectus fascia was incised and then elevated.  The rectus muscle was mobilized free of the overlying fascia.  The peritoneum was incised in the midline well above the location of the bladder.  An Alexis wound protector was placed.  Towels were placed around the field.  The divided colon was passed through the wound protector.  The point of proximal division was identified and was again on a healthy segment of supple colon with a palpable pulse in the mesentery. This was pink in color.  A pursestring device was applied.  A 2-0 Prolene on a Keith needle was passed.  The colon was divided and passed off with the open end being proximal.  EEA sizers were then introduced and a 29 mm EEA selected.  Belt loops consisting of 3-0 silk were placed around the pursestring suture line.  The anvil was placed and the pursestring tied.  A  small amount of fat was cleared from the planned anastomosis and no diverticula were apparent within this.  This was placed back into the abdomen and a cap placed over the wound protector port site.  Pneumoperitoneum was reestablished.  I then went below to pass the stapler.  My partner remained above.  EEA sizers were cautiously introduced via the anus and advanced under direct visualization.  The stapler was passed and the spike deployed just anterior to the staple line.  The components were then mated.  Orientation was confirmed such that there is no twisting of the colon nor small bowel underneath the mesenteric defect. Care was taken to ensure no other structures were incorporated within this either.  The stapler was then closed, held, and fired. This was then removed. The donuts were inspected and noted to be complete.  The colon proximal to the anastomosis was then gently occluded. The pelvis was filled with sterile irrigation. Under direct  visualization, I passed a flexible sigmoidoscope.  The anastomosis was under water .  With good distention of the anastomosis there was no air leak. The anastomosis pink in appearance.  This is located at 3-4 cm from the anal verge by flexible sigmoidoscopy.  This is below the peritoneal reflection.  It is hemostatic.  Additionally, looking from above, there is no tension on the colon or mesentery.  Sigmoidoscope was withdrawn.  Irrigation was evacuated from the pelvis.  The abdomen and pelvis are surveyed and noted to be completely hemostatic without any apparent injury.  Under direct visualization, all trochars are removed.  The Alexis wound protector was removed.  Gowns/gloves are changed and a fresh set of clean instruments utilized. Additional sterile drapes were placed around the field.  The premarked ileostomy site is identified in the right lower quadrant.  A wheal of skin is excised.  The underlying subcutaneous tissue was also divided similarly using electrocautery.  The fascia is incised in a longitudinal manner.  The underlying rectus muscle was then carefully spread using a Kelly clamp.  With my hand partially in the abdomen through the Pfannenstiel incision, were able to identify her ileostomy site and use her hand as a barrier.  The peritoneum was then incised.  We are then able to bring a loop of distal ileum out through this site.  Orientation was then confirmed in part using Arfeen and still incision to ensure there is no twisting and that we actually had the appropriate segment of ileum.  This is approximately 20 cm from the ileocecal valve.  Orientation was confirmed.  A Babcock was placed to assist in maintaining orientation.   The Pfannenstiel peritoneum was closed with a running 2-0 Vicryl suture.  The rectus fascia was then closed using 2 running #1 PDS sutures.  The fascia was then palpated and noted to be completely closed.  Additional anesthetic was infiltrated at the Pfannenstiel site.   Sponge, needle, and instrument counts were reported correct x2. 4-0 Monocryl subcuticular suture was used to close the skin of all incision sites.  Dermabond was placed over all incisions.    After the Dermabond dried, attention was turned to maturing the loop ileostomy.  The ileum was incised.  The proximal portion of the loop ileostomy was then brooked using 3-0 Vicryl suture.  The distal limb was secured at the dermal junction using 3-0 Vicryl suture as well.  The ileostomy is patent.  An ostomy appliance was then cut to fit and applied.   She was then taken out  of lithotomy, awakened from anesthesia, extubated, and transferred to a stretcher for transport to PACU in satisfactory condition having tolerated the procedure well.

## 2023-04-21 NOTE — Transfer of Care (Signed)
 Immediate Anesthesia Transfer of Care Note  Patient: Yvonne Scott  Procedure(s) Performed: Procedure(s): XI ROBOTIC ASSISTED LOWER ANTERIOR RESECTION, ILEOSTOMY CREATION (N/A) FLEXIBLE SIGMOIDOSCOPY, ICG PERFUSION (N/A)  Patient Location: PACU  Anesthesia Type:General  Level of Consciousness:  sedated, patient cooperative and responds to stimulation  Airway & Oxygen Therapy:Patient Spontanous Breathing and Patient connected to face mask oxgen  Post-op Assessment:  Report given to PACU RN and Post -op Vital signs reviewed and stable  Post vital signs:  Reviewed and stable  Last Vitals:  Vitals:   04/21/23 1016  BP: 132/87  Pulse: 97  Resp: 16  Temp: 36.6 C  SpO2: 96%    Complications: No apparent anesthesia complications

## 2023-04-21 NOTE — Plan of Care (Signed)
  Problem: Education: Goal: Knowledge of General Education information will improve Description: Including pain rating scale, medication(s)/side effects and non-pharmacologic comfort measures Outcome: Progressing   Problem: Health Behavior/Discharge Planning: Goal: Ability to manage health-related needs will improve Outcome: Progressing   Problem: Clinical Measurements: Goal: Will remain free from infection Outcome: Progressing Goal: Respiratory complications will improve Outcome: Progressing   Problem: Education: Goal: Understanding of discharge needs will improve Outcome: Progressing Goal: Verbalization of understanding of the causes of altered bowel function will improve Outcome: Progressing   Problem: Activity: Goal: Ability to tolerate increased activity will improve Outcome: Progressing   Problem: Nutrition: Goal: Adequate nutrition will be maintained Outcome: Not Progressing   Problem: Coping: Goal: Level of anxiety will decrease Outcome: Not Progressing   Problem: Elimination: Goal: Will not experience complications related to bowel motility Outcome: Not Progressing Goal: Will not experience complications related to urinary retention Outcome: Not Progressing   Problem: Pain Managment: Goal: General experience of comfort will improve and/or be controlled Outcome: Not Progressing   Problem: Skin Integrity: Goal: Risk for impaired skin integrity will decrease Outcome: Not Progressing

## 2023-04-21 NOTE — Anesthesia Postprocedure Evaluation (Signed)
 Anesthesia Post Note  Patient: Yvonne Scott  Procedure(s) Performed: XI ROBOTIC ASSISTED LOWER ANTERIOR RESECTION, ILEOSTOMY CREATION FLEXIBLE SIGMOIDOSCOPY, ICG PERFUSION     Patient location during evaluation: PACU Anesthesia Type: General Level of consciousness: awake and alert Pain management: pain level controlled Vital Signs Assessment: post-procedure vital signs reviewed and stable Respiratory status: spontaneous breathing, nonlabored ventilation and respiratory function stable Cardiovascular status: blood pressure returned to baseline Postop Assessment: no apparent nausea or vomiting Anesthetic complications: no   No notable events documented.  Last Vitals:  Vitals:   04/21/23 1715 04/21/23 1725  BP: (!) 144/74   Pulse: 78 94  Resp: 11 16  Temp: 36.4 C   SpO2: 97% 98%    Last Pain:  Vitals:   04/21/23 1715  TempSrc:   PainSc: 4                  Vertell Row

## 2023-04-21 NOTE — Anesthesia Procedure Notes (Signed)
 Procedure Name: Intubation Date/Time: 04/21/2023 1:10 PM  Performed by: Carleton Garnette SAUNDERS, CRNAPre-anesthesia Checklist: Patient identified, Emergency Drugs available, Suction available, Patient being monitored and Timeout performed Patient Re-evaluated:Patient Re-evaluated prior to induction Oxygen Delivery Method: Circle system utilized Preoxygenation: Pre-oxygenation with 100% oxygen Induction Type: IV induction Ventilation: Mask ventilation without difficulty Laryngoscope Size: Mac and 3 Grade View: Grade I Tube type: Oral Tube size: 7.0 mm Number of attempts: 1 Airway Equipment and Method: Stylet Placement Confirmation: ETT inserted through vocal cords under direct vision, positive ETCO2 and breath sounds checked- equal and bilateral Secured at: 21 cm Tube secured with: Tape Dental Injury: Teeth and Oropharynx as per pre-operative assessment

## 2023-04-22 ENCOUNTER — Encounter (HOSPITAL_COMMUNITY): Payer: Self-pay | Admitting: Surgery

## 2023-04-22 LAB — CBC
HCT: 38.8 % (ref 36.0–46.0)
Hemoglobin: 12.7 g/dL (ref 12.0–15.0)
MCH: 31.4 pg (ref 26.0–34.0)
MCHC: 32.7 g/dL (ref 30.0–36.0)
MCV: 95.8 fL (ref 80.0–100.0)
Platelets: 184 10*3/uL (ref 150–400)
RBC: 4.05 MIL/uL (ref 3.87–5.11)
RDW: 15.2 % (ref 11.5–15.5)
WBC: 11.1 10*3/uL — ABNORMAL HIGH (ref 4.0–10.5)
nRBC: 0 % (ref 0.0–0.2)

## 2023-04-22 LAB — BASIC METABOLIC PANEL
Anion gap: 13 (ref 5–15)
BUN: 8 mg/dL (ref 8–23)
CO2: 21 mmol/L — ABNORMAL LOW (ref 22–32)
Calcium: 7.8 mg/dL — ABNORMAL LOW (ref 8.9–10.3)
Chloride: 99 mmol/L (ref 98–111)
Creatinine, Ser: 0.77 mg/dL (ref 0.44–1.00)
GFR, Estimated: >60 mL/min (ref >60)
Glucose, Bld: 86 mg/dL (ref 70–99)
Potassium: 3.9 mmol/L (ref 3.5–5.1)
Sodium: 133 mmol/L — ABNORMAL LOW (ref 135–145)

## 2023-04-22 MED ORDER — CALCIUM POLYCARBOPHIL 625 MG PO TABS
625.0000 mg | ORAL_TABLET | Freq: Two times a day (BID) | ORAL | Status: DC
  Administered 2023-04-22 – 2023-04-26 (×9): 625 mg via ORAL
  Filled 2023-04-22 (×9): qty 1

## 2023-04-22 MED ORDER — SODIUM CHLORIDE 0.9% FLUSH: 10.0000 mL | INTRAVENOUS | Status: DC | PRN

## 2023-04-22 MED ORDER — SODIUM CHLORIDE 0.9% FLUSH
10.0000 mL | Freq: Two times a day (BID) | INTRAVENOUS | Status: DC
Start: 2023-04-22 — End: 2023-04-26
  Administered 2023-04-22 – 2023-04-26 (×3): 10 mL

## 2023-04-22 MED ORDER — CHLORHEXIDINE GLUCONATE CLOTH 2 % EX PADS
6.0000 | MEDICATED_PAD | Freq: Every day | CUTANEOUS | Status: DC
  Administered 2023-04-22 – 2023-04-26 (×5): 6 via TOPICAL

## 2023-04-22 NOTE — Progress Notes (Signed)
 PT Cancellation/Screen Note  Patient Details Name: Yvonne Scott MRN: [id number] DOB: 1960/04/18   Cancelled Treatment:    Reason Eval/Treat Not Completed: PT screened, no needs identified, will sign off. Spoke with pt who denies need for PT services. Pt reports she has been independently mobilizing in room and in hallway.     Dannial SQUIBB, PT Acute Rehabilitation  Office: [phone]

## 2023-04-22 NOTE — Plan of Care (Signed)
 ?  Problem: Clinical Measurements: ?Goal: Will remain free from infection ?Outcome: Progressing ?  ?

## 2023-04-22 NOTE — Progress Notes (Addendum)
 Subjective No acute events.  Feeling quite well today.  No nausea or vomiting.  Ostomy has began to have liquid output.  Denies any significant abdominal pain today.  Objective: Vital signs in last 24 hours: Temp:  [97.6 F (36.4 C)-98.2 F (36.8 C)] 97.8 F (36.6 C) (02/08 0923) Pulse Rate:  [73-103] 77 (02/08 0923) Resp:  [11-31] 18 (02/08 0923) BP: (105-146)/(56-97) 109/56 (02/08 0923) SpO2:  [95 %-100 %] 97 % (02/08 0923) Weight:  [54.4 kg-59 kg] 59 kg (02/08 0500) Last BM Date : 04/22/23  Intake/Output from previous day: 02/07 0701 - 02/08 0700 In: 2609.6 [P.O.:350; I.V.:2159.6; IV Piggyback:100] Out: 1260 [Urine:510; Stool:700; Blood:50] Intake/Output this shift: Total I/O In: 240 [P.O.:240] Out: 150 [Stool:150]  Gen: NAD, comfortable CV: RRR Pulm: Normal work of breathing Abd: Soft, minimal if any tenderness; no distention.  Ostomy is pink and productive of thin bilious effluent Ext: SCDs in place  Lab Results: CBC  Recent Labs    04/21/23 1652 04/22/23 0535  WBC 10.9* 11.1*  HGB 13.6 12.7  HCT 40.1 38.8  PLT 192 184   BMET Recent Labs    04/21/23 1652 04/22/23 0535  NA  --  133*  K  --  3.9  CL  --  99  CO2  --  21*  GLUCOSE  --  86  BUN  --  8  CREATININE 0.88 0.77  CALCIUM   --  7.8*   PT/INR No results for input(s): LABPROT, INR in the last 72 hours. ABG No results for input(s): PHART, HCO3 in the last 72 hours.  Invalid input(s): PCO2, PO2  Studies/Results:  Anti-infectives: Anti-infectives (From admission, onward)    Start     Dose/Rate Route Frequency Ordered Stop   04/21/23 1400  neomycin  (MYCIFRADIN ) tablet 1,000 mg  Status:  Discontinued       Placed in And Linked Group   1,000 mg Oral 3 times per day 04/21/23 1010 04/21/23 1012   04/21/23 1400  metroNIDAZOLE  (FLAGYL ) tablet 1,000 mg  Status:  Discontinued       Placed in And Linked Group   1,000 mg Oral 3 times per day 04/21/23 1010 04/21/23 1012   04/21/23  1015  cefoTEtan  (CEFOTAN ) 2 g in sodium chloride  0.9 % 100 mL IVPB        2 g 200 mL/hr over 30 Minutes Intravenous On call to O.R. 04/21/23 1010 04/21/23 1827        Assessment/Plan: Patient Active Problem List   Diagnosis Date Noted   S/P partial colectomy 04/21/2023   Pain in the abdomen 02/06/2023   Genetic testing 01/24/2023   Abnormal PET scan of colon 12/07/2022   Rectal adenocarcinoma (HCC) 10/13/2022   Squamous cell lung cancer, right (HCC) 08/12/2022   Mixed hyperlipidemia 06/21/2022   Cramps, muscle, general 06/21/2022   Other fatigue 06/21/2022   Rib pain on right side 01/25/2022   Vitamin D  deficiency 01/25/2022   Thrush 01/25/2022   B12 deficiency 01/06/2022   Protein-calorie malnutrition, severe 09/07/2021   Dysphagia 09/05/2021   Prolonged QT interval 09/05/2021   Syncope, vasovagal 09/04/2021   Syncope 09/04/2021   Antineoplastic chemotherapy induced anemia 08/24/2021   Neutropenic fever (HCC)    HCAP (healthcare-associated pneumonia) 07/16/2021   Sepsis (HCC) 07/16/2021   Pancytopenia (HCC)    Port-A-Cath in place 07/07/2021   Anxiety associated with cancer diagnosis (HCC) 07/07/2021   Chemotherapy-induced neuropathy (HCC) 06/08/2021   Anxiety 06/08/2021   Hypomagnesemia 04/13/2021   Generalized body aches  03/11/2021   Neoplasm related pain 03/11/2021   Encounter for antineoplastic chemotherapy 03/11/2021   Encounter for monitoring cardiotoxic drug therapy 02/27/2021   Invasive ductal carcinoma of right breast (HCC) 02/20/2021   Goals of care, counseling/discussion 02/20/2021   Metabolic acidosis 02/22/2020   DKA (diabetic ketoacidosis) (HCC) 02/21/2020   Elevated LFTs 02/21/2020   AKI (acute kidney injury) (HCC) 02/21/2020   Leukocytosis 02/21/2020   Hyponatremia 01/30/2017   Hypochloremia 01/30/2017   Hypokalemia 01/27/2017   Hyperglycemia 01/27/2017   Back pain 12/15/2016   Chronic lower back pain 12/15/2016   Elevated ferritin 11/07/2016    Aortic atherosclerosis (HCC) 10/24/2016   Fatty liver 10/24/2016   Thrombocytopenia (HCC) 10/24/2016   Medication monitoring encounter 11/19/2015   GERD (gastroesophageal reflux disease) 11/19/2015   Tobacco abuse 11/19/2015   Essential hypertension, benign 11/19/2015   s/p Procedure(s): XI ROBOTIC ASSISTED LOWER ANTERIOR RESECTION, ILEOSTOMY CREATION FLEXIBLE SIGMOIDOSCOPY, ICG PERFUSION 04/21/2023  - She is doing quite well. - PT OT - WOCN to see for new ileostomy teaching - Home health consult in place - Ambulate 5 times per day as able - Will advance to soft diet today - Start FiberCon tablets to help thicken ostomy output - PPX: SQH, SCDs  - Spent time reviewing her procedure, findings, and plans moving forward.   LOS: 1 day   Lonni Pizza, MD [facility], [facility]

## 2023-04-23 LAB — BASIC METABOLIC PANEL
Anion gap: 10 (ref 5–15)
BUN: 7 mg/dL — ABNORMAL LOW (ref 8–23)
CO2: 22 mmol/L (ref 22–32)
Calcium: 7.7 mg/dL — ABNORMAL LOW (ref 8.9–10.3)
Chloride: 107 mmol/L (ref 98–111)
Creatinine, Ser: 0.62 mg/dL (ref 0.44–1.00)
GFR, Estimated: >60 mL/min (ref >60)
Glucose, Bld: 100 mg/dL — ABNORMAL HIGH (ref 70–99)
Potassium: 2.8 mmol/L — ABNORMAL LOW (ref 3.5–5.1)
Sodium: 139 mmol/L (ref 135–145)

## 2023-04-23 LAB — CBC
HCT: 32 % — ABNORMAL LOW (ref 36.0–46.0)
Hemoglobin: 11 g/dL — ABNORMAL LOW (ref 12.0–15.0)
MCH: 32.6 pg (ref 26.0–34.0)
MCHC: 34.4 g/dL (ref 30.0–36.0)
MCV: 95 fL (ref 80.0–100.0)
Platelets: 171 10*3/uL (ref 150–400)
RBC: 3.37 MIL/uL — ABNORMAL LOW (ref 3.87–5.11)
RDW: 15.4 % (ref 11.5–15.5)
WBC: 8.7 10*3/uL (ref 4.0–10.5)
nRBC: 0 % (ref 0.0–0.2)

## 2023-04-23 NOTE — Progress Notes (Signed)
 Subjective No acute events.  Continues to feel quite well today.  No nausea or vomiting.  Ostomy output has begun to thicken.  Denies any significant abdominal pain today.  Objective: Vital signs in last 24 hours: Temp:  [97.6 F (36.4 C)-98.5 F (36.9 C)] 98.5 F (36.9 C) (02/09 0520) Pulse Rate:  [71-86] 83 (02/09 0520) Resp:  [15-18] 16 (02/09 0520) BP: (95-109)/(54-59) 95/54 (02/09 0520) SpO2:  [95 %-98 %] 97 % (02/09 0520) Weight:  [58.4 kg] 58.4 kg (02/09 0500) Last BM Date : 04/23/23  Intake/Output from previous day: 02/08 0701 - 02/09 0700 In: 2563.2 [P.O.:1685; I.V.:878.2] Out: 1550 [Urine:1000; Stool:550] Intake/Output this shift: No intake/output data recorded.  Gen: NAD, comfortable CV: RRR Pulm: Normal work of breathing Abd: Soft, minimal if any tenderness; no distention.  Ostomy is pink and productive of toothpaste consistency greenish-yellow effluent Ext: SCDs in place  Lab Results: CBC  Recent Labs    04/22/23 0535 04/23/23 0328  WBC 11.1* 8.7  HGB 12.7 11.0*  HCT 38.8 32.0*  PLT 184 171   BMET Recent Labs    04/22/23 0535 04/23/23 0328  NA 133* 139  K 3.9 2.8*  CL 99 107  CO2 21* 22  GLUCOSE 86 100*  BUN 8 7*  CREATININE 0.77 0.62  CALCIUM  7.8* 7.7*   PT/INR No results for input(s): LABPROT, INR in the last 72 hours. ABG No results for input(s): PHART, HCO3 in the last 72 hours.  Invalid input(s): PCO2, PO2  Studies/Results:  Anti-infectives: Anti-infectives (From admission, onward)    Start     Dose/Rate Route Frequency Ordered Stop   04/21/23 1400  neomycin  (MYCIFRADIN ) tablet 1,000 mg  Status:  Discontinued       Placed in And Linked Group   1,000 mg Oral 3 times per day 04/21/23 1010 04/21/23 1012   04/21/23 1400  metroNIDAZOLE  (FLAGYL ) tablet 1,000 mg  Status:  Discontinued       Placed in And Linked Group   1,000 mg Oral 3 times per day 04/21/23 1010 04/21/23 1012   04/21/23 1015  cefoTEtan  (CEFOTAN ) 2 g  in sodium chloride  0.9 % 100 mL IVPB        2 g 200 mL/hr over 30 Minutes Intravenous On call to O.R. 04/21/23 1010 04/21/23 1827        Assessment/Plan: Patient Active Problem List   Diagnosis Date Noted   S/P partial colectomy 04/21/2023   Pain in the abdomen 02/06/2023   Genetic testing 01/24/2023   Abnormal PET scan of colon 12/07/2022   Rectal adenocarcinoma (HCC) 10/13/2022   Squamous cell lung cancer, right (HCC) 08/12/2022   Mixed hyperlipidemia 06/21/2022   Cramps, muscle, general 06/21/2022   Other fatigue 06/21/2022   Rib pain on right side 01/25/2022   Vitamin D  deficiency 01/25/2022   Thrush 01/25/2022   B12 deficiency 01/06/2022   Protein-calorie malnutrition, severe 09/07/2021   Dysphagia 09/05/2021   Prolonged QT interval 09/05/2021   Syncope, vasovagal 09/04/2021   Syncope 09/04/2021   Antineoplastic chemotherapy induced anemia 08/24/2021   Neutropenic fever (HCC)    HCAP (healthcare-associated pneumonia) 07/16/2021   Sepsis (HCC) 07/16/2021   Pancytopenia (HCC)    Port-A-Cath in place 07/07/2021   Anxiety associated with cancer diagnosis (HCC) 07/07/2021   Chemotherapy-induced neuropathy (HCC) 06/08/2021   Anxiety 06/08/2021   Hypomagnesemia 04/13/2021   Generalized body aches 03/11/2021   Neoplasm related pain 03/11/2021   Encounter for antineoplastic chemotherapy 03/11/2021   Encounter for monitoring cardiotoxic  drug therapy 02/27/2021   Invasive ductal carcinoma of right breast (HCC) 02/20/2021   Goals of care, counseling/discussion 02/20/2021   Metabolic acidosis 02/22/2020   DKA (diabetic ketoacidosis) (HCC) 02/21/2020   Elevated LFTs 02/21/2020   AKI (acute kidney injury) (HCC) 02/21/2020   Leukocytosis 02/21/2020   Hyponatremia 01/30/2017   Hypochloremia 01/30/2017   Hypokalemia 01/27/2017   Hyperglycemia 01/27/2017   Back pain 12/15/2016   Chronic lower back pain 12/15/2016   Elevated ferritin 11/07/2016   Aortic atherosclerosis (HCC)  10/24/2016   Fatty liver 10/24/2016   Thrombocytopenia (HCC) 10/24/2016   Medication monitoring encounter 11/19/2015   GERD (gastroesophageal reflux disease) 11/19/2015   Tobacco abuse 11/19/2015   Essential hypertension, benign 11/19/2015   s/p Procedure(s): XI ROBOTIC ASSISTED LOWER ANTERIOR RESECTION, ILEOSTOMY CREATION FLEXIBLE SIGMOIDOSCOPY, ICG PERFUSION 04/21/2023  - She is doing quite well. - PT OT - WOCN to see for new ileostomy teaching - Home health consult in place - Ambulate 5 times per day as able - Soft diet as tolerated - Continue FiberCon tablets to help maintain ostomy output - PPX: SQH, SCDs  Path pending   LOS: 2 days   Lonni Pizza, MD [facility], A [facility]

## 2023-04-23 NOTE — Plan of Care (Signed)
 ?  Problem: Clinical Measurements: ?Goal: Will remain free from infection ?Outcome: Progressing ?  ?

## 2023-04-23 NOTE — Evaluation (Signed)
 Occupational Therapy Evaluation Patient Details Name: Yvonne Scott MRN: [id number] DOB: August 25, 1960 Today's Date: 04/23/2023   History of Present Illness Yvonne Scott is an 63 y.o. female with history of breast cancer, SCC lung cancer, asthma whom is seen in the office today as a referral by Dr. Babara for evaluation of rectal cancer. Pt did undergo parial colectomy per MD   Clinical Impression   Pt admitted for the above. Pt currently with functional limitations due to the deficits listed below (see OT Problem List).  Pt will benefit from acute skilled OT to increase their safety and independence with ADL and functional mobility for ADL to facilitate discharge.  OT eval and education provided - focusing on energy conservation.         Functional Status Assessment  Patient has had a recent decline in their functional status and demonstrates the ability to make significant improvements in function in a reasonable and predictable amount of time.  Equipment Recommendations          Precautions / Restrictions Precautions Precautions: None Restrictions Weight Bearing Restrictions Per Provider Order: No      Mobility Bed Mobility Overal bed mobility: Independent                  Transfers Overall transfer level: Independent                        Balance Overall balance assessment: No apparent balance deficits (not formally assessed)                                         ADL either performed or assessed with clinical judgement   ADL Overall ADL's : Modified independent                                       General ADL Comments: other than new colostomy- pt reports RN is working with her on being I with this task     Vision Baseline Vision/History: 0 No visual deficits              Pertinent Vitals/Pain Pain Assessment Pain Assessment: No/denies pain     Extremity/Trunk Assessment     Lower Extremity  Assessment Lower Extremity Assessment: Overall WFL for tasks assessed          Cognition Arousal: Alert Behavior During Therapy: WFL for tasks assessed/performed Overall Cognitive Status: Within Functional Limits for tasks assessed                                       General Comments    OT session focused on energy conservation strategies as pt has many responsibilities as she is care 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  Handout provided and education provided in detail.           Home Living Family/patient expects to be discharged to:: Private residence Living Arrangements: Parent Available Help at Discharge: Family Type of Home: Mobile home Home Access: Level entry     Home Layout: One level     Bathroom Shower/Tub: Chief Strategy Officer: Standard     Home Equipment: None  OT Goals(Current goals can be found in the care plan section) Acute Rehab OT Goals Patient Stated Goal: get well to take care of my aging parents-  everything is on my OT Goal Formulation: With patient Potential to Achieve Goals: Good  OT Frequency:         AM-PAC OT 6 Clicks Daily Activity     Outcome Measure Help from another person eating meals?: None Help from another person taking care of personal grooming?: None Help from another person toileting, which includes using toliet, bedpan, or urinal?: None Help from another person bathing (including washing, rinsing, drying)?: None Help from another person to put on and taking off regular upper body clothing?: None Help from another person to put on and taking off regular lower body clothing?: None 6 Click Score: 24   End of Session Nurse Communication: Mobility status  Activity Tolerance: Patient tolerated treatment well Patient left: in chair;with call bell/phone within reach                   Time: 0945-1000 OT Time Calculation (min): 15  min Charges:  OT General Charges $OT Visit: 1 Visit OT Evaluation $OT Eval Low Complexity: 1 Low    Bleu Moisan, Norvel BIRCH 04/23/2023, 11:29 AM

## 2023-04-23 NOTE — TOC Initial Note (Addendum)
 Transition of Care [facility]) - Initial/Assessment Note    Patient Details  Name: Yvonne Scott MRN: [id number] Date of Birth: May 27, 1960  [facility]) CM/SW Contact:    Yvonne Manuella Quill, RN Phone Number: 04/23/2023, 1:29 PM  Clinical Narrative:                 TOC for d/c planning; orders received for [facility]; spoke w/ pt in room; pt says she lives at home; she plans to return at d/c; pt identified POC Yvonne Scott (mother) [phone]; pt verified PCP/insurance; she denies SDOH risks; pt says she does not have DME, HH services, or home oxygen; pt agrees to receive [facility]; she does not have an agency preference; spoke w/ Darleene; he says agency cannot provide service; contacted Greig Cedar at [facility]; she will check to see if agency can provide service; awaiting return call.  -1400- spoke w/ Channing at [facility]; she will check w/ branch manager to see if agency can accept pt; awaiting return call  -1406- spoke w/ Amy at [place]; she will call main TOC office w/ decision; passing onto coming [facility] for follow up  -1418- notified by Channing at [facility] agency cannot accept referral out of network  Expected Discharge Plan: Home w Home Health Services Barriers to Discharge: Continued Medical Work up   Patient Goals and CMS Choice Patient states their goals for this hospitalization and ongoing recovery are:: home          Expected Discharge Plan and Services   Discharge Planning Services: CM Consult   Living arrangements for the past 2 months: Apartment                                      Prior Living Arrangements/Services Living arrangements for the past 2 months: Apartment Lives with:: Relatives Patient language and need for interpreter reviewed:: Yes Do you feel safe going back to the place where you live?: Yes      Need for Family Participation in Patient Care: Yes (Comment) Care giver support system in place?: Yes (comment) Current home services:   (n/a) Criminal Activity/Legal Involvement Pertinent to Current Situation/Hospitalization: No - Comment as needed  Activities of Daily Living   ADL Screening (condition at time of admission) Independently performs ADLs?: No Does the patient have a NEW difficulty with bathing/dressing/toileting/self-feeding that is expected to last >3 days?: Yes (Initiates electronic notice to provider for possible OT consult) Does the patient have a NEW difficulty with getting in/out of bed, walking, or climbing stairs that is expected to last >3 days?: Yes (Initiates electronic notice to provider for possible PT consult) Does the patient have a NEW difficulty with communication that is expected to last >3 days?: No Is the patient deaf or have difficulty hearing?: No Does the patient have difficulty seeing, even when wearing glasses/contacts?: No Does the patient have difficulty concentrating, remembering, or making decisions?: No  Permission Sought/Granted Permission sought to share information with : Case Manager Permission granted to share information with : Yes, Verbal Permission Granted  Share Information with NAME: Case Manager     Permission granted to share info w Relationship: Yvonne Scott (mother) [phone]     Emotional Assessment Appearance:: Appears stated age Attitude/Demeanor/Rapport: Gracious Affect (typically observed): Accepting Orientation: : Oriented to Self, Oriented to Place, Oriented to  Time, Oriented to Situation Alcohol  / Substance Use: Not Applicable Psych Involvement: No (comment)  Admission diagnosis:  S/P partial colectomy [Z90.49] Patient Active Problem List   Diagnosis Date Noted   S/P partial colectomy 04/21/2023   Pain in the abdomen 02/06/2023   Genetic testing 01/24/2023   Abnormal PET scan of colon 12/07/2022   Rectal adenocarcinoma (HCC) 10/13/2022   Squamous cell lung cancer, right (HCC) 08/12/2022   Mixed hyperlipidemia 06/21/2022   Cramps, muscle,  general 06/21/2022   Other fatigue 06/21/2022   Rib pain on right side 01/25/2022   Vitamin D  deficiency 01/25/2022   Thrush 01/25/2022   B12 deficiency 01/06/2022   Protein-calorie malnutrition, severe 09/07/2021   Dysphagia 09/05/2021   Prolonged QT interval 09/05/2021   Syncope, vasovagal 09/04/2021   Syncope 09/04/2021   Antineoplastic chemotherapy induced anemia 08/24/2021   Neutropenic fever (HCC)    HCAP (healthcare-associated pneumonia) 07/16/2021   Sepsis (HCC) 07/16/2021   Pancytopenia (HCC)    Port-A-Cath in place 07/07/2021   Anxiety associated with cancer diagnosis (HCC) 07/07/2021   Chemotherapy-induced neuropathy (HCC) 06/08/2021   Anxiety 06/08/2021   Hypomagnesemia 04/13/2021   Generalized body aches 03/11/2021   Neoplasm related pain 03/11/2021   Encounter for antineoplastic chemotherapy 03/11/2021   Encounter for monitoring cardiotoxic drug therapy 02/27/2021   Invasive ductal carcinoma of right breast (HCC) 02/20/2021   Goals of care, counseling/discussion 02/20/2021   Metabolic acidosis 02/22/2020   DKA (diabetic ketoacidosis) (HCC) 02/21/2020   Elevated LFTs 02/21/2020   AKI (acute kidney injury) (HCC) 02/21/2020   Leukocytosis 02/21/2020   Hyponatremia 01/30/2017   Hypochloremia 01/30/2017   Hypokalemia 01/27/2017   Hyperglycemia 01/27/2017   Back pain 12/15/2016   Chronic lower back pain 12/15/2016   Elevated ferritin 11/07/2016   Aortic atherosclerosis (HCC) 10/24/2016   Fatty liver 10/24/2016   Thrombocytopenia (HCC) 10/24/2016   Medication monitoring encounter 11/19/2015   GERD (gastroesophageal reflux disease) 11/19/2015   Tobacco abuse 11/19/2015   Essential hypertension, benign 11/19/2015   PCP:  Carin Gauze, NP Pharmacy:   [facility] [address]), [place] - [address] [place]) [place] [address] Phone: [phone] Fax: [phone]     Social Drivers of Health  (SDOH) Social History: SDOH Screenings   Food Insecurity: No Food Insecurity (04/23/2023)  Housing: Low Risk  (04/23/2023)  Transportation Needs: No Transportation Needs (04/23/2023)  Utilities: Not At Risk (04/23/2023)  Alcohol  Screen: Low Risk  (01/06/2022)  Depression (PHQ2-9): Medium Risk (01/06/2022)  Financial Resource Strain: Low Risk  (01/06/2022)  Physical Activity: Inactive (01/06/2022)  Social Connections: Moderately Isolated (01/06/2022)  Stress: Stress Concern Present (01/06/2022)  Tobacco Use: High Risk (04/21/2023)   SDOH Interventions: Food Insecurity Interventions: Intervention Not Indicated, Inpatient TOC Housing Interventions: Intervention Not Indicated, Inpatient TOC Transportation Interventions: Intervention Not Indicated, Inpatient TOC Utilities Interventions: Intervention Not Indicated, Inpatient TOC   Readmission Risk Interventions    04/23/2023    1:26 PM  Readmission Risk Prevention Plan  Transportation Screening Complete  PCP or Specialist Appt within 5-7 Days Complete  Home Care Screening Complete  Medication Review (RN CM) Complete

## 2023-04-24 LAB — CBC
HCT: 31.1 % — ABNORMAL LOW (ref 36.0–46.0)
Hemoglobin: 10.3 g/dL — ABNORMAL LOW (ref 12.0–15.0)
MCH: 31.5 pg (ref 26.0–34.0)
MCHC: 33.1 g/dL (ref 30.0–36.0)
MCV: 95.1 fL (ref 80.0–100.0)
Platelets: 168 10*3/uL (ref 150–400)
RBC: 3.27 MIL/uL — ABNORMAL LOW (ref 3.87–5.11)
RDW: 15.9 % — ABNORMAL HIGH (ref 11.5–15.5)
WBC: 7.7 10*3/uL (ref 4.0–10.5)
nRBC: 0 % (ref 0.0–0.2)

## 2023-04-24 LAB — BASIC METABOLIC PANEL
Anion gap: 7 (ref 5–15)
BUN: 6 mg/dL — ABNORMAL LOW (ref 8–23)
CO2: 22 mmol/L (ref 22–32)
Calcium: 7.7 mg/dL — ABNORMAL LOW (ref 8.9–10.3)
Chloride: 109 mmol/L (ref 98–111)
Creatinine, Ser: 0.53 mg/dL (ref 0.44–1.00)
GFR, Estimated: >60 mL/min (ref >60)
Glucose, Bld: 97 mg/dL (ref 70–99)
Potassium: 3 mmol/L — ABNORMAL LOW (ref 3.5–5.1)
Sodium: 138 mmol/L (ref 135–145)

## 2023-04-24 NOTE — Plan of Care (Signed)
   Problem: Education: Goal: Knowledge of General Education information will improve Description Including pain rating scale, medication(s)/side effects and non-pharmacologic comfort measures Outcome: Progressing

## 2023-04-24 NOTE — Progress Notes (Signed)
 Subjective No acute events.  Continues to feel quite well today.  No nausea or vomiting.  Ostomy output controlled, thick.  Denies any significant abdominal pain.  Objective: Vital signs in last 24 hours: Temp:  [98.2 F (36.8 C)-98.4 F (36.9 C)] 98.4 F (36.9 C) (02/10 0555) Pulse Rate:  [75-87] 75 (02/10 0555) Resp:  [16-18] 18 (02/10 0555) BP: (92-119)/(55-66) 119/66 (02/10 0555) SpO2:  [95 %-98 %] 98 % (02/10 0826) Weight:  [59.5 kg] 59.5 kg (02/10 0500) Last BM Date : 04/23/23  Intake/Output from previous day: 02/09 0701 - 02/10 0700 In: 840 [P.O.:840] Out: 1450 [Urine:800; Stool:650] Intake/Output this shift: No intake/output data recorded.  Gen: NAD, comfortable CV: RRR Pulm: Normal work of breathing Abd: Soft, nontender, no distention.  Ostomy is pink and productive of toothpaste consistency greenish-yellow effluent Ext: SCDs in place  Lab Results: CBC  Recent Labs    04/23/23 0328 04/24/23 0318  WBC 8.7 7.7  HGB 11.0* 10.3*  HCT 32.0* 31.1*  PLT 171 168   BMET Recent Labs    04/23/23 0328 04/24/23 0318  NA 139 138  K 2.8* 3.0*  CL 107 109  CO2 22 22  GLUCOSE 100* 97  BUN 7* 6*  CREATININE 0.62 0.53  CALCIUM  7.7* 7.7*   PT/INR No results for input(s): "LABPROT", "INR" in the last 72 hours. ABG No results for input(s): "PHART", "HCO3" in the last 72 hours.  Invalid input(s): "PCO2", "PO2"  Studies/Results:  Anti-infectives: Anti-infectives (From admission, onward)    Start     Dose/Rate Route Frequency Ordered Stop   04/21/23 1400  neomycin  (MYCIFRADIN ) tablet 1,000 mg  Status:  Discontinued       Placed in "And" Linked Group   1,000 mg Oral 3 times per day 04/21/23 1010 04/21/23 1012   04/21/23 1400  metroNIDAZOLE  (FLAGYL ) tablet 1,000 mg  Status:  Discontinued       Placed in "And" Linked Group   1,000 mg Oral 3 times per day 04/21/23 1010 04/21/23 1012   04/21/23 1015  cefoTEtan  (CEFOTAN ) 2 g in sodium chloride  0.9 % 100 mL IVPB         2 g 200 mL/hr over 30 Minutes Intravenous On call to O.R. 04/21/23 1010 04/21/23 1827        Assessment/Plan: Patient Active Problem List   Diagnosis Date Noted   S/P partial colectomy 04/21/2023   Pain in the abdomen 02/06/2023   Genetic testing 01/24/2023   Abnormal PET scan of colon 12/07/2022   Rectal adenocarcinoma (HCC) 10/13/2022   Squamous cell lung cancer, right (HCC) 08/12/2022   Mixed hyperlipidemia 06/21/2022   Cramps, muscle, general 06/21/2022   Other fatigue 06/21/2022   Rib pain on right side 01/25/2022   Vitamin D  deficiency 01/25/2022   Thrush 01/25/2022   B12 deficiency 01/06/2022   Protein-calorie malnutrition, severe 09/07/2021   Dysphagia 09/05/2021   Prolonged QT interval 09/05/2021   Syncope, vasovagal 09/04/2021   Syncope 09/04/2021   Antineoplastic chemotherapy induced anemia 08/24/2021   Neutropenic fever (HCC)    HCAP (healthcare-associated pneumonia) 07/16/2021   Sepsis (HCC) 07/16/2021   Pancytopenia (HCC)    Port-A-Cath in place 07/07/2021   Anxiety associated with cancer diagnosis (HCC) 07/07/2021   Chemotherapy-induced neuropathy (HCC) 06/08/2021   Anxiety 06/08/2021   Hypomagnesemia 04/13/2021   Generalized body aches 03/11/2021   Neoplasm related pain 03/11/2021   Encounter for antineoplastic chemotherapy 03/11/2021   Encounter for monitoring cardiotoxic drug therapy 02/27/2021   Invasive ductal  carcinoma of right breast (HCC) 02/20/2021   Goals of care, counseling/discussion 02/20/2021   Metabolic acidosis 02/22/2020   DKA (diabetic ketoacidosis) (HCC) 02/21/2020   Elevated LFTs 02/21/2020   AKI (acute kidney injury) (HCC) 02/21/2020   Leukocytosis 02/21/2020   Hyponatremia 01/30/2017   Hypochloremia 01/30/2017   Hypokalemia 01/27/2017   Hyperglycemia 01/27/2017   Back pain 12/15/2016   Chronic lower back pain 12/15/2016   Elevated ferritin 11/07/2016   Aortic atherosclerosis (HCC) 10/24/2016   Fatty liver 10/24/2016    Thrombocytopenia (HCC) 10/24/2016   Medication monitoring encounter 11/19/2015   GERD (gastroesophageal reflux disease) 11/19/2015   Tobacco abuse 11/19/2015   Essential hypertension, benign 11/19/2015   s/p Procedure(s): XI ROBOTIC ASSISTED LOWER ANTERIOR RESECTION, ILEOSTOMY CREATION FLEXIBLE SIGMOIDOSCOPY, ICG PERFUSION 04/21/2023  - She is doing great - PT OT - WOCN to see for new ileostomy teaching - Home health consult in place - Ambulate 5x/day as able - Soft diet as tolerated - Continue FiberCon tablets to help maintain ostomy output - PPX: SQH, SCDs  Path pending  Home health plans underway - medically stable for discharge pending being comfortable managing her ostomy   LOS: 3 days   Beatris Lincoln, MD [facility], A [facility]

## 2023-04-24 NOTE — Plan of Care (Signed)
   Problem: Activity: Goal: Risk for activity intolerance will decrease Outcome: Progressing

## 2023-04-24 NOTE — Consult Note (Signed)
 WOC Nurse ostomy consult note; loop ileostomy placed by Dr. Camilo Cella 04/21/2023  Stoma type/location:  loop ileostomy RLQ  Stomal assessment/size: 1 1/4" round, red moist, slightly budded productive of brown liquid effluent  Peristomal assessment:  intact  Treatment options for stomal/peristomal skin: barrier wipe and 2" barrier ring  Output  approximately 100 mls brown liquid effluent  Ostomy pouching: 2 piece 2 1/4" skin barrier Timm Foot [phone]), 2 1/4" pouch Timm Foot [phone]) and 2" barrier ring Timm Foot [phone])  Education provided: Discussed with patient emptying pouch when 1/3 to 1/2 full.  Patient was able to return demonstration on emptying pouch and cleaning spout with toilet paper wick.  Did discuss emptying into toilet or container at home.  We discussed changing entire pouching system 2 times a week and as needed for leaking.  Removed current pouch to assess stoma.  Educated on cleaning around stoma with water  moistened washcloth only, no soaps, lotions or baby wipes.  Sized stoma at 1 1/4" round today.  Discussed that stoma is edematous and size may decrease, should size stoma once a week for first month.  Patient was able to cut new skin barrier at 1 1/4" round today. I stretched and placed a 2" barrier ring around stoma.  Patient practiced snapping new pouch onto skin barrier and was able to perform this without difficulty.  Patient removed plastic backing from new skin barrier and I placed on top of 2" barrier ring.  I snapped pouch onto skin barrier and closed pouch.    We reviewed showering with pouch on or off.  Also discussed changing pouch first thing in the morning prior to eating or drinking as stoma will likely be less productive at this time.  We discussed high output ileostomy and reviewed handout with patient.  I also demonstrated and discussed rationale for use of an ostomy belt.  I demonstrated how and when to use stoma powder to crust skin around stoma.   I ordered (8) sets of 2 1/4" flat  skin barrier, pouch and 2" barrier ring. I placed no sting barrier wipe pads, stoma powder and a large ostomy belt in patients bag for home.  Patient will have home health at discharge as well.  Reviewed all educational materials in room and discussed how to order supplies through [employer], [employer] or ostomy clinic at [employer].  Provided patient with ostomy clinic handout and encouraged her to use this resource if she has difficulties with pouching or skin breakdown once home.    WOC team will plan to visit with patient Tuesday 04/25/2023 for additional teaching in anticipation of discharge.   Enrolled patient in [facility] DC program: Yes  Thank you,    Ronni Colace MSN, RN-BC, [employer] [phone]

## 2023-04-25 LAB — CBC WITH DIFFERENTIAL/PLATELET
Abs Immature Granulocytes: 0.06 10*3/uL (ref 0.00–0.07)
Basophils Absolute: 0 10*3/uL (ref 0.0–0.1)
Basophils Relative: 0 %
Eosinophils Absolute: 0.3 10*3/uL (ref 0.0–0.5)
Eosinophils Relative: 4 %
HCT: 36.7 % (ref 36.0–46.0)
Hemoglobin: 12.6 g/dL (ref 12.0–15.0)
Immature Granulocytes: 1 %
Lymphocytes Relative: 24 %
Lymphs Abs: 2.2 10*3/uL (ref 0.7–4.0)
MCH: 32.9 pg (ref 26.0–34.0)
MCHC: 34.3 g/dL (ref 30.0–36.0)
MCV: 95.8 fL (ref 80.0–100.0)
Monocytes Absolute: 0.4 10*3/uL (ref 0.1–1.0)
Monocytes Relative: 5 %
Neutro Abs: 6 10*3/uL (ref 1.7–7.7)
Neutrophils Relative %: 66 %
Platelets: 218 10*3/uL (ref 150–400)
RBC: 3.83 MIL/uL — ABNORMAL LOW (ref 3.87–5.11)
RDW: 16.4 % — ABNORMAL HIGH (ref 11.5–15.5)
WBC: 9 10*3/uL (ref 4.0–10.5)
nRBC: 0 % (ref 0.0–0.2)

## 2023-04-25 LAB — BASIC METABOLIC PANEL
Anion gap: 10 (ref 5–15)
BUN: 7 mg/dL — ABNORMAL LOW (ref 8–23)
CO2: 19 mmol/L — ABNORMAL LOW (ref 22–32)
Calcium: 8.2 mg/dL — ABNORMAL LOW (ref 8.9–10.3)
Chloride: 107 mmol/L (ref 98–111)
Creatinine, Ser: 0.68 mg/dL (ref 0.44–1.00)
GFR, Estimated: >60 mL/min (ref >60)
Glucose, Bld: 97 mg/dL (ref 70–99)
Potassium: 3.3 mmol/L — ABNORMAL LOW (ref 3.5–5.1)
Sodium: 136 mmol/L (ref 135–145)

## 2023-04-25 LAB — SURGICAL PATHOLOGY

## 2023-04-25 MED ORDER — POTASSIUM CHLORIDE CRYS ER 20 MEQ PO TBCR
40.0000 meq | EXTENDED_RELEASE_TABLET | Freq: Once | ORAL | Status: AC
  Administered 2023-04-25: 40 meq via ORAL
  Filled 2023-04-25: qty 2

## 2023-04-25 NOTE — Plan of Care (Signed)
  Problem: Activity: Goal: Risk for activity intolerance will decrease Outcome: Adequate for Discharge   Problem: Nutrition: Goal: Adequate nutrition will be maintained Outcome: Adequate for Discharge   Problem: Coping: Goal: Level of anxiety will decrease Outcome: Adequate for Discharge   Problem: Elimination: Goal: Will not experience complications related to bowel motility Outcome: Adequate for Discharge   Problem: Elimination: Goal: Will not experience complications related to urinary retention Outcome: Adequate for Discharge

## 2023-04-25 NOTE — Progress Notes (Signed)
Subjective No acute events.  Has had some LLQ burning type sensation. Denies any distention or pain elsewhere.  No nausea or vomiting.  Ostomy output controlled, thick.  Objective: Vital signs in last 24 hours: Temp:  [97.9 F (36.6 C)-98.1 F (36.7 C)] 98.1 F (36.7 C) (02/11 0515) Pulse Rate:  [80-82] 82 (02/11 0515) Resp:  [16-18] 16 (02/11 0515) BP: (102-126)/(63-67) 126/67 (02/11 0515) SpO2:  [95 %-99 %] 97 % (02/11 0835) Weight:  [56.6 kg] 56.6 kg (02/11 0500) Last BM Date : 04/25/23  Intake/Output from previous day: 02/10 0701 - 02/11 0700 In: 1110 [P.O.:1110] Out: 750 [Urine:750] Intake/Output this shift: No intake/output data recorded.  Gen: NAD, comfortable CV: RRR Pulm: Normal work of breathing Abd: Soft, nontender, no distention.  Ostomy is pink and productive of toothpaste consistency greenish-yellow effluent Ext: SCDs in place  Lab Results: CBC  Recent Labs    04/23/23 0328 04/24/23 0318  WBC 8.7 7.7  HGB 11.0* 10.3*  HCT 32.0* 31.1*  PLT 171 168   BMET Recent Labs    04/23/23 0328 04/24/23 0318  NA 139 138  K 2.8* 3.0*  CL 107 109  CO2 22 22  GLUCOSE 100* 97  BUN 7* 6*  CREATININE 0.62 0.53  CALCIUM 7.7* 7.7*   PT/INR No results for input(s): "LABPROT", "INR" in the last 72 hours. ABG No results for input(s): "PHART", "HCO3" in the last 72 hours.  Invalid input(s): "PCO2", "PO2"  Studies/Results:  Anti-infectives: Anti-infectives (From admission, onward)    Start     Dose/Rate Route Frequency Ordered Stop   04/21/23 1400  neomycin (MYCIFRADIN) tablet 1,000 mg  Status:  Discontinued       Placed in "And" Linked Group   1,000 mg Oral 3 times per day 04/21/23 1010 04/21/23 1012   04/21/23 1400  metroNIDAZOLE (FLAGYL) tablet 1,000 mg  Status:  Discontinued       Placed in "And" Linked Group   1,000 mg Oral 3 times per day 04/21/23 1010 04/21/23 1012   04/21/23 1015  cefoTEtan (CEFOTAN) 2 g in sodium chloride 0.9 % 100 mL IVPB         2 g 200 mL/hr over 30 Minutes Intravenous On call to O.R. 04/21/23 1010 04/21/23 1827        Assessment/Plan: Patient Active Problem List   Diagnosis Date Noted   S/P partial colectomy 04/21/2023   Pain in the abdomen 02/06/2023   Genetic testing 01/24/2023   Abnormal PET scan of colon 12/07/2022   Rectal adenocarcinoma (HCC) 10/13/2022   Squamous cell lung cancer, right (HCC) 08/12/2022   Mixed hyperlipidemia 06/21/2022   Cramps, muscle, general 06/21/2022   Other fatigue 06/21/2022   Rib pain on right side 01/25/2022   Vitamin D deficiency 01/25/2022   Thrush 01/25/2022   B12 deficiency 01/06/2022   Protein-calorie malnutrition, severe 09/07/2021   Dysphagia 09/05/2021   Prolonged QT interval 09/05/2021   Syncope, vasovagal 09/04/2021   Syncope 09/04/2021   Antineoplastic chemotherapy induced anemia 08/24/2021   Neutropenic fever (HCC)    HCAP (healthcare-associated pneumonia) 07/16/2021   Sepsis (HCC) 07/16/2021   Pancytopenia (HCC)    Port-A-Cath in place 07/07/2021   Anxiety associated with cancer diagnosis (HCC) 07/07/2021   Chemotherapy-induced neuropathy (HCC) 06/08/2021   Anxiety 06/08/2021   Hypomagnesemia 04/13/2021   Generalized body aches 03/11/2021   Neoplasm related pain 03/11/2021   Encounter for antineoplastic chemotherapy 03/11/2021   Encounter for monitoring cardiotoxic drug therapy 02/27/2021   Invasive ductal  carcinoma of right breast (HCC) 02/20/2021   Goals of care, counseling/discussion 02/20/2021   Metabolic acidosis 02/22/2020   DKA (diabetic ketoacidosis) (HCC) 02/21/2020   Elevated LFTs 02/21/2020   AKI (acute kidney injury) (HCC) 02/21/2020   Leukocytosis 02/21/2020   Hyponatremia 01/30/2017   Hypochloremia 01/30/2017   Hypokalemia 01/27/2017   Hyperglycemia 01/27/2017   Back pain 12/15/2016   Chronic lower back pain 12/15/2016   Elevated ferritin 11/07/2016   Aortic atherosclerosis (HCC) 10/24/2016   Fatty liver 10/24/2016    Thrombocytopenia (HCC) 10/24/2016   Medication monitoring encounter 11/19/2015   GERD (gastroesophageal reflux disease) 11/19/2015   Tobacco abuse 11/19/2015   Essential hypertension, benign 11/19/2015   s/p Procedure(s): XI ROBOTIC ASSISTED LOWER ANTERIOR RESECTION, ILEOSTOMY CREATION FLEXIBLE SIGMOIDOSCOPY, ICG PERFUSION 04/21/2023  - She is doing great - PT OT - WOCN to see for new ileostomy teaching - Home health consult in place - Ambulate 5x/day as able - Soft diet as tolerated - Continue FiberCon tablets to help maintain ostomy output - PPX: SQH, SCDs  Path pending  Home health plans underway - will see how the day goes; if feeling better later and all is in place for home, possible discharge vs keeping another day   LOS: 4 days   Marin Olp, MD [facility]

## 2023-04-25 NOTE — Consult Note (Signed)
WOC RN in to check pouch and to assess for additional teaching needs. Patient standing up beside bed saying she is having  left sided abdominal pain (has spoken with surgeon regarding this).  Pouch placed 04/24/2023 intact, patient states she does not have further questions regarding ostomy at this time.   Patient is going home with home health.  WOC RN will plan to assess again prior to discharge for any needs.  Supplies for home use are in the room.  SS yes.    Thank you,    Priscella Mann MSN, RN-BC, [employer] [phone]

## 2023-04-25 NOTE — Plan of Care (Signed)
Problem: Education: Goal: Knowledge of General Education information will improve Description: Including pain rating scale, medication(s)/side effects and non-pharmacologic comfort measures Outcome: Progressing   Problem: Coping: Goal: Level of anxiety will decrease Outcome: Progressing

## 2023-04-26 MED ORDER — FIBERCON 625 MG PO TABS: 625.0000 mg | ORAL_TABLET | Freq: Two times a day (BID) | ORAL | 0 refills | Status: DC

## 2023-04-26 MED ORDER — HEPARIN SOD (PORK) LOCK FLUSH 100 UNIT/ML IV SOLN
500.0000 [IU] | INTRAVENOUS | Status: AC | PRN
  Administered 2023-04-26: 500 [IU]
  Filled 2023-04-26: qty 5

## 2023-04-26 NOTE — TOC Progression Note (Addendum)
Transition of Care [facility]) - Progression Note    Patient Details  Name: Yvonne Scott MRN: [id number] Date of Birth: 1960/07/14  Transition of Care [facility]) CM/SW Contact  Howell Rucks, RN Phone Number: 04/26/2023, 10:25 AM  Clinical Narrative:  [facility] (rep Amy), [facility] (rep-Angela), [facility] ( rep Haywood Lasso) , [facility] (rep Adele Dan), unable to accept as insurance is out of network, team notified.   -12:36 Patient scheduled for [facility] with  karen,NP with ostomy clinic  Monday, February 17th at 1 p.m    Expected Discharge Plan: Home w Home Health Services Barriers to Discharge: Continued Medical Work up  Expected Discharge Plan and Services   Discharge Planning Services: CM Consult   Living arrangements for the past 2 months: Apartment Expected Discharge Date: 04/26/23                                     Social Determinants of Health (SDOH) Interventions SDOH Screenings   Food Insecurity: No Food Insecurity (04/23/2023)  Housing: Low Risk  (04/23/2023)  Transportation Needs: No Transportation Needs (04/23/2023)  Utilities: Not At Risk (04/23/2023)  Alcohol Screen: Low Risk  (01/06/2022)  Depression (PHQ2-9): Medium Risk (01/06/2022)  Financial Resource Strain: Low Risk  (01/06/2022)  Physical Activity: Inactive (01/06/2022)  Social Connections: Moderately Isolated (01/06/2022)  Stress: Stress Concern Present (01/06/2022)  Tobacco Use: High Risk (04/21/2023)    Readmission Risk Interventions    04/23/2023    1:26 PM  Readmission Risk Prevention Plan  Transportation Screening Complete  PCP or Specialist Appt within 5-7 Days Complete  Home Care Screening Complete  Medication Review (RN CM) Complete

## 2023-04-26 NOTE — Plan of Care (Signed)
Problem: Health Behavior/Discharge Planning: Goal: Ability to manage health-related needs will improve Outcome: Progressing   Problem: Clinical Measurements: Goal: Will remain free from infection Outcome: Progressing

## 2023-04-26 NOTE — Discharge Summary (Signed)
Patient ID: Yvonne Scott MRN: [id number] DOB/AGE: 1960-09-09 63 y.o.  Admit date: 04/21/2023 Discharge date: 04/26/2023  Discharge Diagnoses Patient Active Problem List   Diagnosis Date Noted   S/P partial colectomy 04/21/2023   Pain in the abdomen 02/06/2023   Genetic testing 01/24/2023   Abnormal PET scan of colon 12/07/2022   Rectal adenocarcinoma (HCC) 10/13/2022   Squamous cell lung cancer, right (HCC) 08/12/2022   Mixed hyperlipidemia 06/21/2022   Cramps, muscle, general 06/21/2022   Other fatigue 06/21/2022   Rib pain on right side 01/25/2022   Vitamin D deficiency 01/25/2022   Thrush 01/25/2022   B12 deficiency 01/06/2022   Protein-calorie malnutrition, severe 09/07/2021   Dysphagia 09/05/2021   Prolonged QT interval 09/05/2021   Syncope, vasovagal 09/04/2021   Syncope 09/04/2021   Antineoplastic chemotherapy induced anemia 08/24/2021   Neutropenic fever (HCC)    HCAP (healthcare-associated pneumonia) 07/16/2021   Sepsis (HCC) 07/16/2021   Pancytopenia (HCC)    Port-A-Cath in place 07/07/2021   Anxiety associated with cancer diagnosis (HCC) 07/07/2021   Chemotherapy-induced neuropathy (HCC) 06/08/2021   Anxiety 06/08/2021   Hypomagnesemia 04/13/2021   Generalized body aches 03/11/2021   Neoplasm related pain 03/11/2021   Encounter for antineoplastic chemotherapy 03/11/2021   Encounter for monitoring cardiotoxic drug therapy 02/27/2021   Invasive ductal carcinoma of right breast (HCC) 02/20/2021   Goals of care, counseling/discussion 02/20/2021   Metabolic acidosis 02/22/2020   DKA (diabetic ketoacidosis) (HCC) 02/21/2020   Elevated LFTs 02/21/2020   AKI (acute kidney injury) (HCC) 02/21/2020   Leukocytosis 02/21/2020   Hyponatremia 01/30/2017   Hypochloremia 01/30/2017   Hypokalemia 01/27/2017   Hyperglycemia 01/27/2017   Back pain 12/15/2016   Chronic lower back pain 12/15/2016   Elevated ferritin 11/07/2016   Aortic atherosclerosis (HCC) 10/24/2016    Fatty liver 10/24/2016   Thrombocytopenia (HCC) 10/24/2016   Medication monitoring encounter 11/19/2015   GERD (gastroesophageal reflux disease) 11/19/2015   Tobacco abuse 11/19/2015   Essential hypertension, benign 11/19/2015    Consultants Wound ostomy care team  Procedures PROCEDURE:  Robotic assisted low anterior resection with diverting loop ileostomy Diagnostic flexible sigmoidoscopy-necessary to confirm location of mass Intraoperative assessment of perfusion using ICG fluorescence imaging Bilateral transversus abdominus plane (TAP) blocks  Hospital Course: She was admitted postoperatively and recovered well.  Her ostomy began putting out nicely.  Output was controlled with just oral fiber supplementation.  Her diet was sequentially advanced.  She underwent teaching by wound ostomy care team.  On 04/26/2023, she is tolerating a diet, mobilizing well on her own, has undergone ostomy care teaching, and output is well-controlled.  We attempted to arrange home health however have been notified by her case management team that she is not a candidate for home health due to insurance related issues.  This was all reviewed with her and she feels comfortable going home without this.  From our perspective, she is stable for discharge home today as well.  Expectations have been reviewed.  Follow-up has been arranged.  She has been encouraged to call with any questions or concerns.    Allergies as of 04/26/2023   No Known Allergies      Medication List     TAKE these medications    budesonide-formoterol 160-4.5 MCG/ACT inhaler Commonly known as: Symbicort Inhale 2 puffs into the lungs 2 (two) times daily.   calcium carbonate 750 MG chewable tablet Commonly known as: TUMS EX Chew 2 tablets by mouth 4 (four) times daily as needed for heartburn.  dicyclomine 10 MG capsule Commonly known as: BENTYL Take 1 capsule (10 mg total) by mouth 4 (four) times daily -  before meals and at  bedtime.   FiberCon 625 MG tablet Generic drug: polycarbophil Take 1 tablet (625 mg total) by mouth 2 (two) times daily.   fluticasone 50 MCG/ACT nasal spray Commonly known as: FLONASE Place 1 spray into both nostrils daily. What changed: when to take this   ibuprofen 200 MG tablet Commonly known as: ADVIL Take 200-400 mg by mouth every 6 (six) hours as needed for headache.   Advil 200 MG tablet Generic drug: ibuprofen Take 600 mg by mouth every 6 (six) hours as needed for mild pain (pain score 1-3) or headache.   lidocaine-prilocaine cream Commonly known as: EMLA APPLY TO AFFECTED AREA ONCE What changed: See the new instructions.   magnesium chloride 64 MG Tbec SR tablet Commonly known as: SLOW-MAG Take 1 tablet (64 mg total) by mouth 2 (two) times daily. What changed:  how much to take when to take this   naloxone 4 MG/0.1ML Liqd nasal spray kit Commonly known as: NARCAN Place 0.4 mg into the nose once as needed (as directed).   omeprazole 40 MG capsule Commonly known as: PRILOSEC Take 1 capsule (40 mg total) by mouth daily. What changed:  when to take this additional instructions   potassium chloride 10 MEQ CR capsule Commonly known as: MICRO-K Take 1 capsule (10 mEq total) by mouth daily. What changed:  when to take this reasons to take this   Spiriva Respimat 2.5 MCG/ACT Aers Generic drug: Tiotropium Bromide Monohydrate Inhale 2 puffs into the lungs daily.   Ventolin HFA 108 (90 Base) MCG/ACT inhaler Generic drug: albuterol INHALE 1 PUFF BY MOUTH EVERY 6 HOURS AS NEEDED FOR SHORTNESS OF BREATH FOR WHEEZING What changed: See the new instructions.          Follow-up Information     Andria Meuse, MD Follow up on 05/23/2023.   Specialties: General Surgery, Colon and Rectal Surgery Why: Please arrive by 10:00 am Contact information: [address] [place] [address] [phone]                  Stephanie Coup. Cliffton Asters, M.D. [facility]

## 2023-04-26 NOTE — Discharge Instructions (Signed)
POST OP INSTRUCTIONS AFTER COLON SURGERY  DIET: Be sure to include lots of fluids daily to stay hydrated - 64oz of water per day (8, 8 oz glasses).  Avoid fast food or heavy meals for the first couple of weeks as your are more likely to get nauseated. Avoid raw/uncooked fruits or vegetables for the first 4 weeks (its ok to have these if they are blended into smoothie form). If you have fruits/vegetables, make sure they are cooked until soft enough to mash on the roof of your mouth and chew your food well. Otherwise, diet as tolerated.  Take your usually prescribed home medications unless otherwise directed.  PAIN CONTROL: Pain is best controlled by a usual combination of three different methods TOGETHER: Ice/Heat Over the counter pain medication Prescription pain medication Most patients will experience some swelling and bruising around the surgical site.  Ice packs or heating pads (30-60 minutes up to 6 times a day) will help. Some people prefer to use ice alone, heat alone, alternating between ice & heat.  Experiment to what works for you.  Swelling and bruising can take several weeks to resolve.   It is helpful to take an over-the-counter pain medication regularly for the first few weeks: Ibuprofen (Motrin/Advil) - 200mg  tabs - take 3 tabs (600mg ) every 6 hours as needed for pain (unless you have been directed previously to avoid NSAIDs/ibuprofen) Acetaminophen (Tylenol) - you may take 650mg  every 6 hours as needed. You can take this with motrin as they act differently on the body. If you are taking a narcotic pain medication that has acetaminophen in it, do not take over the counter tylenol at the same time. NOTE: You may take both of these medications together - most patients  find it most helpful when alternating between the two (i.e. Ibuprofen at 6am, tylenol at 9am, ibuprofen at 12pm ..Marland Kitchen) A  prescription for pain medication should be given to you upon discharge.  Take your pain medication as  prescribed if your pain is not adequatly controlled with the over-the-counter pain reliefs mentioned above.  Avoid getting constipated.  Between the surgery and the pain medications, it is common to experience some constipation.  Increasing fluid intake and taking a fiber supplement (such as Metamucil, Citrucel, FiberCon, MiraLax, etc) 1-2 times a day regularly will usually help prevent this problem from occurring.  A mild laxative (prune juice, Milk of Magnesia, MiraLax, etc) should be taken according to package directions if there are no bowel movements after 48 hours.    Dressing: Your incisions are covered in Dermabond which is like sterile superglue for the skin. This will come off on it's own in a couple weeks. It is waterproof and you may bathe normally starting the day after your surgery in a shower. Avoid baths/pools/lakes/oceans until your wounds have fully healed.  Ileostomy: Stay hydrated! Drink plenty of water or low sugar Gatorade throughout the day. Goals are for the output to be toothpaste consistency - oral fiber tablets (FiberCon) is a great strategy the help keep it thick - 2 tabs in AM, 2 tabs in PM. Empty/record output and monitor ileostomy output over 24 hrs, keeping a log on paper. If the output is >1,200 mL (1.2 L) in 24 hrs, you are at increased risk for dehydration and other issues and we need to know.  Generally we start with a low dose of imodium (2 mg) 2-3 times daily. This will lower the output but keep [place] in the loop.   ACTIVITIES  as tolerated:   Avoid heavy lifting (>10lbs or 1 gallon of milk) for the next 6 weeks. You may resume regular daily activities as tolerated--such as daily self-care, walking, climbing stairs--gradually increasing activities as tolerated.  If you can walk 30 minutes without difficulty, it is safe to try more intense activity such as jogging, treadmill, bicycling, low-impact aerobics.  DO NOT PUSH THROUGH PAIN.  Let pain be your guide: If it hurts to  do something, don't do it. You may drive when you are no longer taking prescription pain medication, you can comfortably wear a seatbelt, and you can safely maneuver your car and apply brakes.  FOLLOW UP in our office Please call [facility] at [phone] to set up an appointment to see your surgeon in the office for a follow-up appointment approximately 2 weeks after your surgery. Make sure that you call for this appointment the day you arrive home to insure a convenient appointment time.  9. If you have disability or family leave forms that need to be completed, you may have them completed by your primary care physician's office; for return to work instructions, please ask our office staff and they will be happy to assist you in obtaining this documentation   When to call us [phone]: Poor pain control Reactions / problems with new medications (rash/itching, etc)  Fever over 101.5 F (38.5 C) Inability to urinate Nausea/vomiting Worsening swelling or bruising Continued bleeding from incision. Increased pain, redness, or drainage from the incision  The clinic staff is available to answer your questions during regular business hours (8:30am-5pm).  Please don't hesitate to call and ask to speak to one of our nurses for clinical concerns.   A surgeon from [facility] is always on call at the hospitals   If you have a medical emergency, go to the nearest emergency room or call 911.  [facility] [address], [place], [place]  [address] MAIN: [phone] FAX: [phone] [email or web address]

## 2023-04-26 NOTE — Progress Notes (Signed)
Subjective No acute events.  Feeling well today.  Denies any nausea or vomiting.  Ostomy output continues to be nice and thick.  She denies any complaints. She reports she would like to go home today if at all possible  Objective: Vital signs in last 24 hours: Temp:  [97.4 F (36.3 C)-98.2 F (36.8 C)] 97.4 F (36.3 C) (02/12 1115) Pulse Rate:  [69-81] 74 (02/12 1115) Resp:  [16-18] 18 (02/12 1115) BP: (111-128)/(59-66) 113/65 (02/12 1115) SpO2:  [96 %-98 %] 98 % (02/12 1115) Weight:  [57.2 kg] 57.2 kg (02/12 0500) Last BM Date : 04/25/23  Intake/Output from previous day: 02/11 0701 - 02/12 0700 In: 710 [P.O.:710] Out: 350 [Stool:350] Intake/Output this shift: No intake/output data recorded.  Gen: NAD, comfortable CV: RRR Pulm: Normal work of breathing Abd: Soft, nontender, no distention.  Ostomy is pink and productive of toothpaste consistency greenish-yellow effluent Ext: SCDs in place  Lab Results: CBC  Recent Labs    04/24/23 0318 04/25/23 1014  WBC 7.7 9.0  HGB 10.3* 12.6  HCT 31.1* 36.7  PLT 168 218   BMET Recent Labs    04/24/23 0318 04/25/23 1014  NA 138 136  K 3.0* 3.3*  CL 109 107  CO2 22 19*  GLUCOSE 97 97  BUN 6* 7*  CREATININE 0.53 0.68  CALCIUM 7.7* 8.2*   PT/INR No results for input(s): "LABPROT", "INR" in the last 72 hours. ABG No results for input(s): "PHART", "HCO3" in the last 72 hours.  Invalid input(s): "PCO2", "PO2"  Studies/Results:  Anti-infectives: Anti-infectives (From admission, onward)    Start     Dose/Rate Route Frequency Ordered Stop   04/21/23 1400  neomycin (MYCIFRADIN) tablet 1,000 mg  Status:  Discontinued       Placed in "And" Linked Group   1,000 mg Oral 3 times per day 04/21/23 1010 04/21/23 1012   04/21/23 1400  metroNIDAZOLE (FLAGYL) tablet 1,000 mg  Status:  Discontinued       Placed in "And" Linked Group   1,000 mg Oral 3 times per day 04/21/23 1010 04/21/23 1012   04/21/23 1015  cefoTEtan (CEFOTAN) 2  g in sodium chloride 0.9 % 100 mL IVPB        2 g 200 mL/hr over 30 Minutes Intravenous On call to O.R. 04/21/23 1010 04/21/23 1827        Assessment/Plan: Patient Active Problem List   Diagnosis Date Noted   S/P partial colectomy 04/21/2023   Pain in the abdomen 02/06/2023   Genetic testing 01/24/2023   Abnormal PET scan of colon 12/07/2022   Rectal adenocarcinoma (HCC) 10/13/2022   Squamous cell lung cancer, right (HCC) 08/12/2022   Mixed hyperlipidemia 06/21/2022   Cramps, muscle, general 06/21/2022   Other fatigue 06/21/2022   Rib pain on right side 01/25/2022   Vitamin D deficiency 01/25/2022   Thrush 01/25/2022   B12 deficiency 01/06/2022   Protein-calorie malnutrition, severe 09/07/2021   Dysphagia 09/05/2021   Prolonged QT interval 09/05/2021   Syncope, vasovagal 09/04/2021   Syncope 09/04/2021   Antineoplastic chemotherapy induced anemia 08/24/2021   Neutropenic fever (HCC)    HCAP (healthcare-associated pneumonia) 07/16/2021   Sepsis (HCC) 07/16/2021   Pancytopenia (HCC)    Port-A-Cath in place 07/07/2021   Anxiety associated with cancer diagnosis (HCC) 07/07/2021   Chemotherapy-induced neuropathy (HCC) 06/08/2021   Anxiety 06/08/2021   Hypomagnesemia 04/13/2021   Generalized body aches 03/11/2021   Neoplasm related pain 03/11/2021   Encounter for antineoplastic chemotherapy 03/11/2021  Encounter for monitoring cardiotoxic drug therapy 02/27/2021   Invasive ductal carcinoma of right breast (HCC) 02/20/2021   Goals of care, counseling/discussion 02/20/2021   Metabolic acidosis 02/22/2020   DKA (diabetic ketoacidosis) (HCC) 02/21/2020   Elevated LFTs 02/21/2020   AKI (acute kidney injury) (HCC) 02/21/2020   Leukocytosis 02/21/2020   Hyponatremia 01/30/2017   Hypochloremia 01/30/2017   Hypokalemia 01/27/2017   Hyperglycemia 01/27/2017   Back pain 12/15/2016   Chronic lower back pain 12/15/2016   Elevated ferritin 11/07/2016   Aortic atherosclerosis  (HCC) 10/24/2016   Fatty liver 10/24/2016   Thrombocytopenia (HCC) 10/24/2016   Medication monitoring encounter 11/19/2015   GERD (gastroesophageal reflux disease) 11/19/2015   Tobacco abuse 11/19/2015   Essential hypertension, benign 11/19/2015   s/p Procedure(s): XI ROBOTIC ASSISTED LOWER ANTERIOR RESECTION, ILEOSTOMY CREATION FLEXIBLE SIGMOIDOSCOPY, ICG PERFUSION 04/21/2023  - She is doing great - WOCN has been following for new ileostomy teaching - Home health consult in place however we have been notified by case management that there is no agency that is willing to accept her.  This was all reviewed with her.  She does have follow-up in the wound ostomy clinic established and states she is okay with going home without home health.  We spent time reviewing everything with her with regards to general expectations, ostomy output goals, things to watch out for, and emphasized the importance of hydration.  We also discussed smoking cessation and the importance therein.  She expressed understanding of all this. - Soft diet as tolerated - Continue FiberCon tablets to help maintain ostomy output - PPX: SQH, SCDs  Path pending    LOS: 5 days   Marin Olp, MD [facility]

## 2023-04-26 NOTE — Consult Note (Addendum)
Patient unable to get home health.  WOC RN reached out to NP at [facility] (located 2nd floor at [facility]) who can see patient Monday 05/01/2023 at 1 p.m. to assist with pouch change and supplies; I personally spoke with patient regarding this and asked that she please make this visit as additional support since she can not get home health, patient agreed. I have provided ostomy clinic handout with number, address and date and time of scheduled visit   WOC Nurse ostomy follow up Stoma type/location: RLQ ileostomy  Stomal assessment/size: 1 1/4" round, pink moist, edematous, productive of brown soft stool  Peristomal assessment:  intact Treatment options for stomal/peristomal skin: 2" barrier ring  Output approximately 100 mls  Ostomy pouching: 2 piece 2 1/4" skin barrier, 2 1/4" pouch and 2" barrier ring  Education provided:  Reviewed with patient emptying when 1/3 to 1/2 full. Patient says she has been emptying pouch herself since our last visit and cleaning spout with toilet paper wick. Reviewed changing entire pouching system 2 times a week or as needed for leaking.  We removed current pouch.  Reviewed cleaning around stoma with water moistened washcloth only, no baby wipes, lotions or soaps to prevent residue.  Sized stoma today at 1 1/4", still edematous.  Discussed with patient sizing once a week for first month and sizing guides given.  Patient has cut her own skin barrier. We placed a 2" barrier ring around stoma, cut new skin barrier to 1 1/4" round and placed new skin barrier onto barrier ring. Snapped new pouch onto skin barrier and patient closed new pouch.  I did place ostomy belt on patient today as well.    Overall patient understands how to change pouching system but would benefit from further assistance.  Patient will be unable to receive home health per [facility] therefore appointment scheduled at [facility] at [facility].    Patient is going home with (9) complete sets of 2 1/4" flat  skin barrier and pouch as well as stoma powder and ostomy belt. I placed (1) one piece convex pouch in bag and ordered 2 piece convex samples from [employer] in case convexity is needed in the future.   Enrolled patient in [place] [employer] Discharge program: Yes  Patient is discharging today.  Will plan to follow-up in ostomy clinic.   Thank you,    Priscella Mann MSN, RN-BC, [employer] [phone]

## 2023-04-26 NOTE — Progress Notes (Signed)
Discharge instructions discussed with patient, verbalized agreement and understanding

## 2023-05-01 ENCOUNTER — Ambulatory Visit: Payer: Medicaid Other | Admitting: Oncology

## 2023-05-01 ENCOUNTER — Telehealth: Payer: Self-pay | Admitting: *Deleted

## 2023-05-01 ENCOUNTER — Ambulatory Visit (HOSPITAL_COMMUNITY)
Admit: 2023-05-01 | Discharge: 2023-05-01 | Disposition: A | Discharge status: Home / Self Care | Payer: Medicaid Other | Attending: Plastic Surgery | Admitting: Plastic Surgery

## 2023-05-01 DIAGNOSIS — Z432 Encounter for attention to ileostomy: Secondary | ICD-10-CM | POA: Insufficient documentation

## 2023-05-01 DIAGNOSIS — Z932 Ileostomy status: Secondary | ICD-10-CM | POA: Diagnosis present

## 2023-05-01 NOTE — Telephone Encounter (Signed)
Patient called today and she said that she has an appt 1 pm in  [place] and then 2 pm for Dr. Cathie Hoops. She can't do both. When I look her appt it was cancelled 2/17.  The patient does not have another appt.

## 2023-05-01 NOTE — Progress Notes (Signed)
[facility]   Reason for visit:  RLQ ileostomy HPI:  Resection with end ileostomy Past Medical History:  Diagnosis Date  . Acute kidney injury (HCC)   . Anxiety   . Aortic atherosclerosis (HCC) 10/24/2016  . Breast cancer (HCC)    colon cancer  . Chemotherapy-induced neuropathy (HCC)   . Depression   . Dyspnea   . GERD (gastroesophageal reflux disease)   . Hypokalemia   . Hypomagnesemia   . Invasive ductal carcinoma of right breast (HCC)   . Metabolic acidosis   . Personal history of chemotherapy   . Pneumonia   . Severe protein-calorie malnutrition (HCC)   . Thrombocytopenia (HCC)    Family History  Problem Relation Age of Onset  . Melanoma Father 87  . Cirrhosis Maternal Grandfather   . Breast cancer Paternal Grandmother    No Known Allergies Current Outpatient Medications  Medication Sig Dispense Refill Last Dose/Taking  . ADVIL 200 MG tablet Take 600 mg by mouth every 6 (six) hours as needed for mild pain (pain score 1-3) or headache.     . budesonide-formoterol (SYMBICORT) 160-4.5 MCG/ACT inhaler Inhale 2 puffs into the lungs 2 (two) times daily. 1 each 12   . calcium carbonate (TUMS EX) 750 MG chewable tablet Chew 2 tablets by mouth 4 (four) times daily as needed for heartburn.     . dicyclomine (BENTYL) 10 MG capsule Take 1 capsule (10 mg total) by mouth 4 (four) times daily -  before meals and at bedtime. (Patient not taking: Reported on 04/22/2023) 30 capsule 0   . fluticasone (FLONASE) 50 MCG/ACT nasal spray Place 1 spray into both nostrils daily. (Patient taking differently: Place 1 spray into both nostrils in the morning and at bedtime.) 1 g 11   . ibuprofen (ADVIL) 200 MG tablet Take 200-400 mg by mouth every 6 (six) hours as needed for headache.     . lidocaine-prilocaine (EMLA) cream APPLY TO AFFECTED AREA ONCE (Patient taking differently: Apply 1 Application topically as needed (for port access).) 30 g 0   . magnesium chloride (SLOW-MAG) 64 MG  TBEC SR tablet Take 1 tablet (64 mg total) by mouth 2 (two) times daily. (Patient taking differently: Take 2 tablets by mouth in the morning.) 60 tablet 2   . naloxone (NARCAN) nasal spray 4 mg/0.1 mL Place 0.4 mg into the nose once as needed (as directed).     Marland Kitchen omeprazole (PRILOSEC) 40 MG capsule Take 1 capsule (40 mg total) by mouth daily. (Patient taking differently: Take 40 mg by mouth See admin instructions. Take 40 mg by mouth 30 minutes prior to breakfast and an additional 40 mg once a day as needed for unresolved heartburn or reflux) 90 capsule 0   . polycarbophil (FIBERCON) 625 MG tablet Take 1 tablet (625 mg total) by mouth 2 (two) times daily. 180 tablet 0   . potassium chloride (MICRO-K) 10 MEQ CR capsule Take 1 capsule (10 mEq total) by mouth daily. (Patient taking differently: Take 10 mEq by mouth daily as needed (when deficient).) 90 capsule 1   . Tiotropium Bromide Monohydrate (SPIRIVA RESPIMAT) 2.5 MCG/ACT AERS Inhale 2 puffs into the lungs daily. (Patient not taking: Reported on 04/22/2023) 4 g 11   . VENTOLIN HFA 108 (90 Base) MCG/ACT inhaler INHALE 1 PUFF BY MOUTH EVERY 6 HOURS AS NEEDED FOR SHORTNESS OF BREATH FOR WHEEZING (Patient taking differently: Inhale 1-2 puffs into the lungs every 6 (six) hours as needed for shortness of  breath or wheezing.) 18 g 0    No current facility-administered medications for this encounter.   Facility-Administered Medications Ordered in Other Encounters  Medication Dose Route Frequency Provider Last Rate Last Admin  . famotidine (PEPCID) 20-0.9 MG/50ML-% IVPB            ROS  Review of Systems  Constitutional:  Positive for fatigue.  Respiratory: Negative.    Gastrointestinal:        RLQ ileostomy  Skin:  Positive for color change.  Psychiatric/Behavioral: Negative.    All other systems reviewed and are negative. Vital signs:  BP 125/82 (BP Location: Right Arm)   Pulse 96   Temp (!) 97.5 F (36.4 C) (Oral)   Resp 18   LMP 11/19/2002  (Approximate)   SpO2 95%  Exam:  Physical Exam Vitals reviewed.  Constitutional:      Appearance: Normal appearance.  HENT:     Mouth/Throat:     Mouth: Mucous membranes are moist.     Comments: Discussed dehydration risk.  Is drinking water.  Encouraged to replace electrolytes with sugar free gatorade, powerade, etc.  Denies dizziness, Cardiovascular:     Rate and Rhythm: Normal rate.     Pulses: Normal pulses.  Pulmonary:     Effort: Pulmonary effort is normal.  Abdominal:     Palpations: Abdomen is soft.     Comments: RLQ ileostomy  Musculoskeletal:        General: Normal range of motion.  Skin:    General: Skin is warm and dry.  Neurological:     General: No focal deficit present.     Mental Status: She is alert and oriented to person, place, and time. Mental status is at baseline.  Psychiatric:        Mood and Affect: Mood normal.        Behavior: Behavior normal.    Stoma type/location:  RLQ ileostomy Stomal assessment/size:  1 1/4" pink and moist Peristomal assessment:  intact  Treatment options for stomal/peristomal skin: barrier ring and 2 piece flat pouch   Output: liquid brown stool Ostomy pouching: 2pc. With barrier ring Education provided:  we perform pouch change.  Patient able to complete with minimal assistance.  I hold a mirror to assist with visualization of stoma    Impression/dx  ileostomy Discussion  We discuss pouch changes twice weekly, showering with or without pouch on.  She is gaining confidence in pouch care We discuss ileostomy diet.  Plan  See back 2 weeks as needed Set up with [facility] for supplies.     Visit time: 55 minutes.   Mike Gip FNP-BC

## 2023-05-01 NOTE — Telephone Encounter (Signed)
Spoke to pt and informed her of appt details. AVS mailed

## 2023-05-01 NOTE — Discharge Instructions (Signed)
Staying in 2 piece opaque pouch with barrier ring I will set up with [facility]  [phone]

## 2023-05-03 DIAGNOSIS — Z432 Encounter for attention to ileostomy: Secondary | ICD-10-CM | POA: Insufficient documentation

## 2023-05-04 ENCOUNTER — Other Ambulatory Visit: Payer: Self-pay | Admitting: *Deleted

## 2023-05-04 NOTE — Progress Notes (Signed)
 The proposed treatment discussed in conference is for discussion purpose only and is not a binding recommendation.  The patients have not been physically examined, or presented with their treatment options.  Therefore, final treatment plans cannot be decided.

## 2023-05-06 IMAGING — CR DG CHEST 2V
2 series · 2 of 2 positions shown · non-contrast
Comparison: 02/22/2020, 02/24/2021

CLINICAL DATA: Cough and congestion, history of known right breast
cancer

EXAM:
CHEST - 2 VIEW

[chest pa]
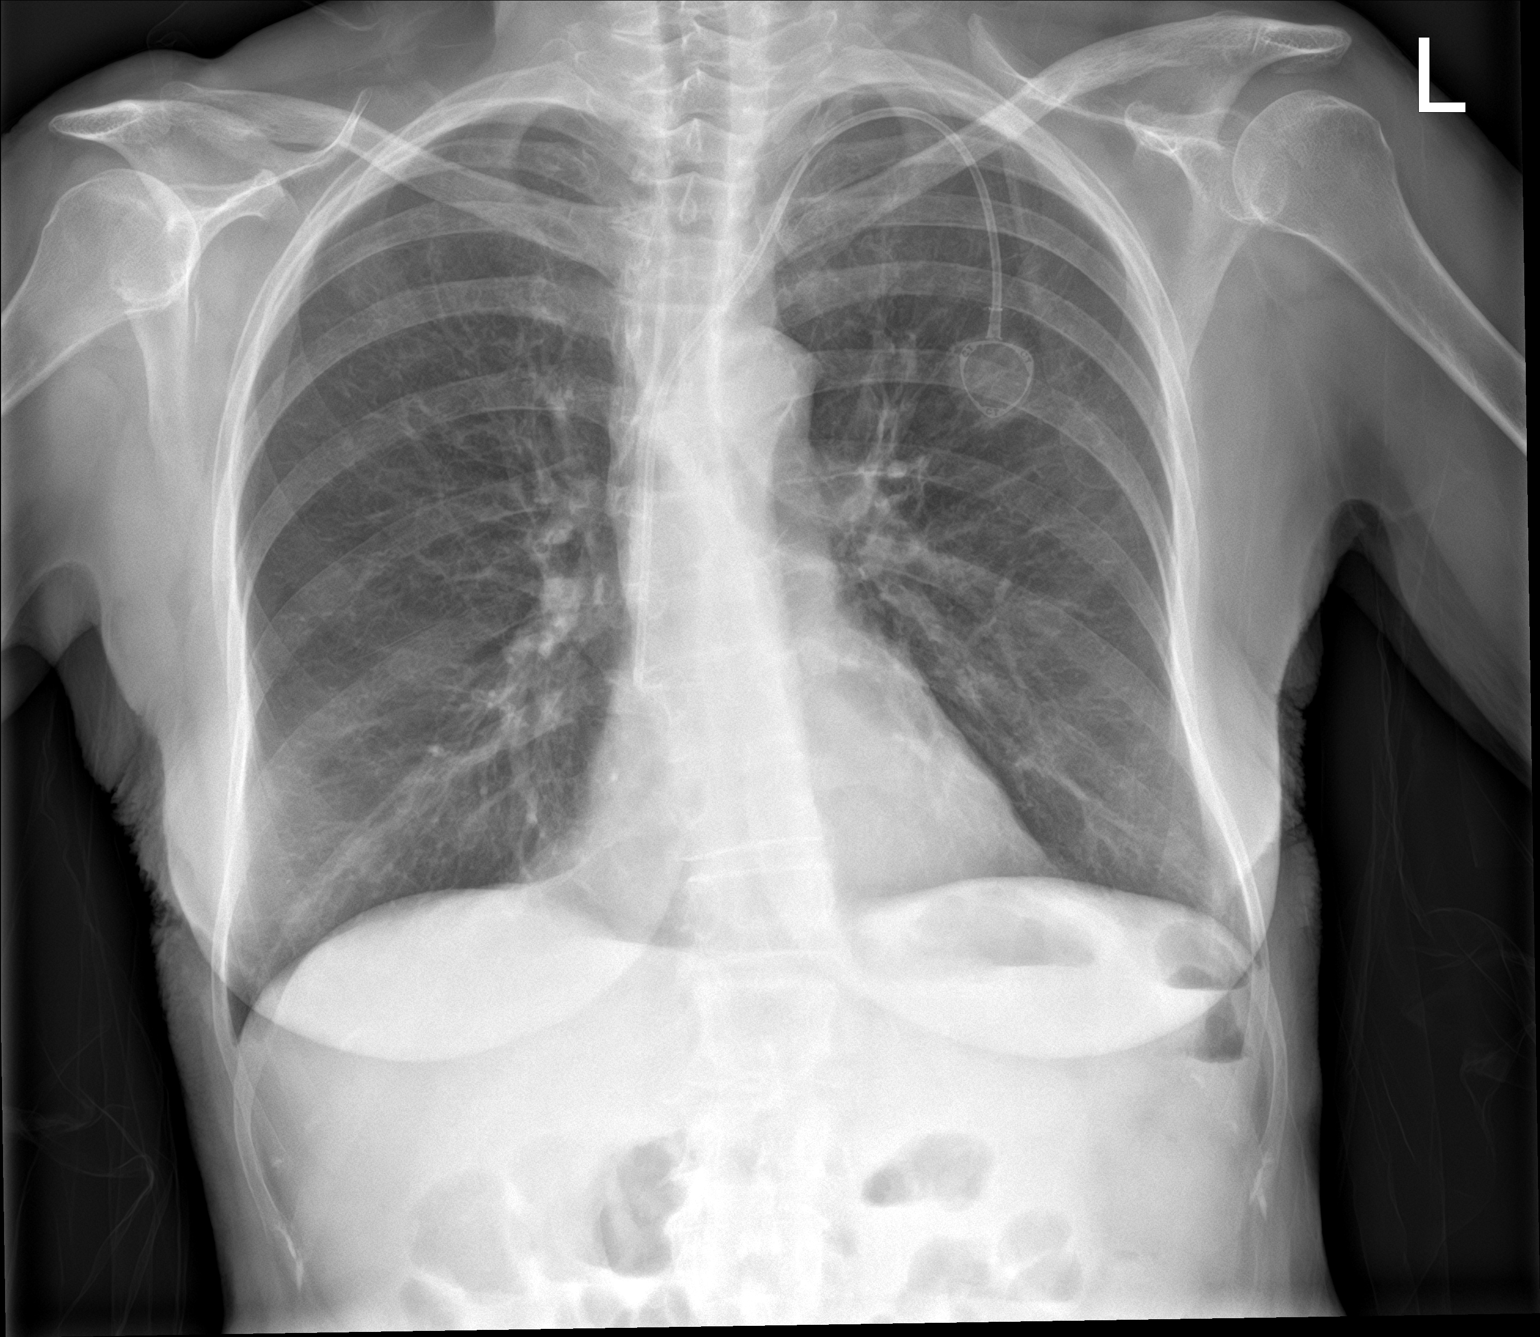

[chest lat]
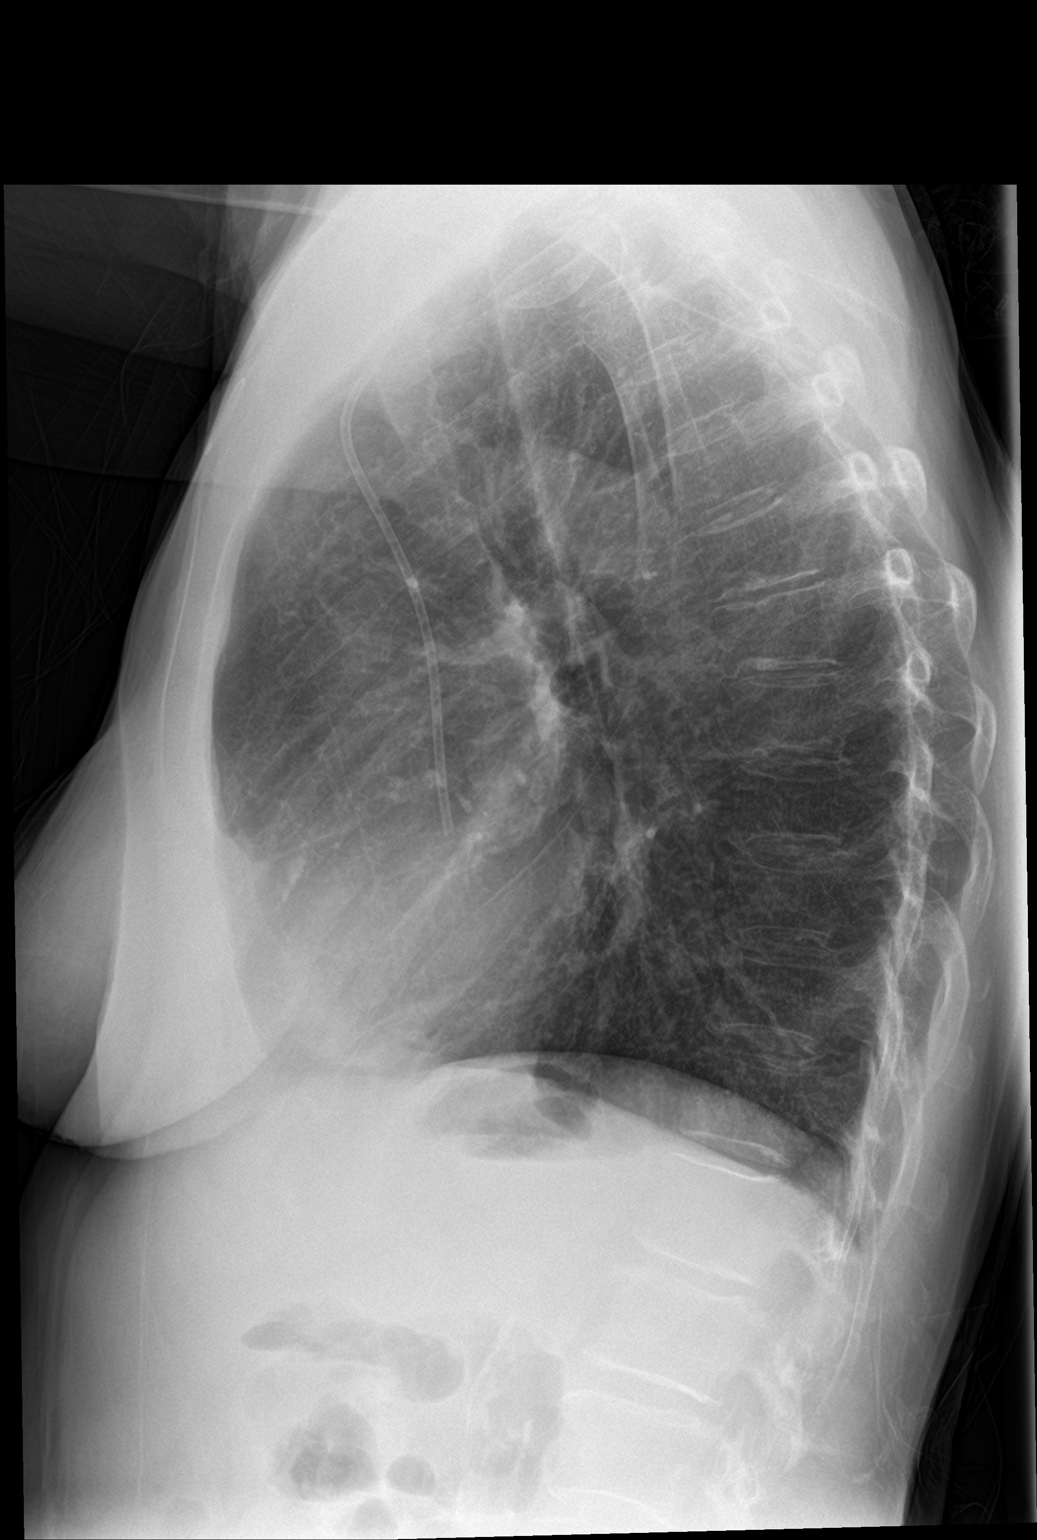

[2 of 2 positions shown; findings below may reference images not displayed]

FINDINGS: Normal heart size and vascularity. No focal pneumonia, collapse or
consolidation. Negative for edema, effusion or pneumothorax. Trachea
midline. Left IJ power port catheter tip SVC RA junction. No acute
or significant osseous finding. Slight scoliosis of the spine as
before and degenerative changes. Nonobstructive bowel gas pattern.
IMPRESSION: Stable chest exam.  No active disease by plain radiography.

## 2023-05-08 ENCOUNTER — Ambulatory Visit (HOSPITAL_COMMUNITY): Payer: Medicaid Other | Admitting: Nurse Practitioner

## 2023-05-09 ENCOUNTER — Other Ambulatory Visit (HOSPITAL_COMMUNITY): Payer: Self-pay | Admitting: Nurse Practitioner

## 2023-05-09 DIAGNOSIS — L24B3 Irritant contact dermatitis related to fecal or urinary stoma or fistula: Secondary | ICD-10-CM

## 2023-05-09 DIAGNOSIS — Z432 Encounter for attention to ileostomy: Secondary | ICD-10-CM

## 2023-05-15 ENCOUNTER — Inpatient Hospital Stay: Payer: Medicaid Other | Attending: Oncology | Admitting: Oncology

## 2023-05-15 ENCOUNTER — Encounter: Payer: Self-pay | Admitting: Oncology

## 2023-05-15 VITALS — BP 147/87 | HR 98 | Temp 98.3°F | Resp 18 | Wt 119.2 lb

## 2023-05-15 DIAGNOSIS — Z95828 Presence of other vascular implants and grafts: Secondary | ICD-10-CM | POA: Diagnosis not present

## 2023-05-15 DIAGNOSIS — Z720 Tobacco use: Secondary | ICD-10-CM | POA: Diagnosis not present

## 2023-05-15 DIAGNOSIS — C50911 Malignant neoplasm of unspecified site of right female breast: Secondary | ICD-10-CM | POA: Insufficient documentation

## 2023-05-15 DIAGNOSIS — R059 Cough, unspecified: Secondary | ICD-10-CM | POA: Insufficient documentation

## 2023-05-15 DIAGNOSIS — F17210 Nicotine dependence, cigarettes, uncomplicated: Secondary | ICD-10-CM | POA: Diagnosis not present

## 2023-05-15 DIAGNOSIS — C3491 Malignant neoplasm of unspecified part of right bronchus or lung: Secondary | ICD-10-CM | POA: Diagnosis not present

## 2023-05-15 DIAGNOSIS — C20 Malignant neoplasm of rectum: Secondary | ICD-10-CM | POA: Diagnosis not present

## 2023-05-15 DIAGNOSIS — Z171 Estrogen receptor negative status [ER-]: Secondary | ICD-10-CM | POA: Diagnosis not present

## 2023-05-15 DIAGNOSIS — J209 Acute bronchitis, unspecified: Secondary | ICD-10-CM | POA: Insufficient documentation

## 2023-05-15 DIAGNOSIS — Z452 Encounter for adjustment and management of vascular access device: Secondary | ICD-10-CM | POA: Diagnosis not present

## 2023-05-15 MED ORDER — AMOXICILLIN-POT CLAVULANATE 875-125 MG PO TABS: 1.0000 | ORAL_TABLET | Freq: Two times a day (BID) | ORAL | 0 refills | Status: DC

## 2023-05-15 NOTE — Assessment & Plan Note (Signed)
Smoke cessation was discussed with patient.

## 2023-05-15 NOTE — Assessment & Plan Note (Signed)
 pTis N0 disease.  Status post lower anterior resection Pathology results reviewed and discussed with patient. No need for adjuvant radiation or chemotherapy.  Follow-up with surgery.

## 2023-05-15 NOTE — Assessment & Plan Note (Addendum)
 Bronchoscopy biopsy of right upper lobe nodule showed squamous cell carcinoma. Other lung nodule was not able to be biopsied due to location and extract for pneumothorax. S/p  lung SBRT.  Repeat CT chest was obtained by radiation oncology.

## 2023-05-15 NOTE — Assessment & Plan Note (Signed)
 At a risk of QT plantation.  I will avoid azithromycin.  Recommend Augmentin 5 days course.

## 2023-05-15 NOTE — Progress Notes (Signed)
 Hematology/Oncology Progress note [phone] [phone]       CHIEF COMPLAINTS/REASON FOR VISIT:  Follow-up for triple negative right breast cancer, squamous cell lung cancer, rectal Stage 0 adenocarcinoma   ASSESSMENT & PLAN:   Cancer Staging  Invasive ductal carcinoma of right breast (HCC) Staging form: Breast, AJCC 8th Edition - Pathologic stage from 10/19/2021: No Stage Recommended (ypT2, pN0, cM0, G3, ER-, PR-, HER2-) - Signed by Rickard Patience, MD on 11/03/2021  Rectal adenocarcinoma [facility]) Staging form: Colon and Rectum, AJCC 8th Edition - Clinical stage from 05/15/2023: Stage 0 (cTis, cN0, cM0) - Signed by Rickard Patience, MD on 05/15/2023  Squamous cell lung cancer, right [facility]) Staging form: Lung, AJCC 8th Edition - Clinical: cT1, cN0, cM0 - Signed by Rickard Patience, MD on 01/10/2023   Invasive ductal carcinoma of right breast (HCC) Clinical stage III triple negative right breast cancer. cT3 cN0-1 S/p  neoadjuvant carboplatin Taxol Keytruda followed by [facility] with Keytruda x 3.   4th cycle of AC with Rande Lawman was not given due to poor tolerance. Status post right mastectomy with sentinel lymph node biopsy. ypT2 ypN0, residual disease, Patient declines any adjuvant chemotherapy or immunotherapy Continue clinical surveillance.  Patient is overdue for left diagnostic mammogram and ultrasound.  Will obtain. genetic counseling showed DICER VUS  Squamous cell lung cancer, right (HCC) Bronchoscopy biopsy of right upper lobe nodule showed squamous cell carcinoma. Other lung nodule was not able to be biopsied due to location and extract for pneumothorax. S/p  lung SBRT.  Repeat CT chest was obtained by radiation oncology.  Port-A-Cath in place She will get [name] flush Q6-8 weeks   Tobacco abuse Smoke cessation was discussed with patient.   Rectal adenocarcinoma (HCC) pTis N0 disease.  Status post lower anterior resection Pathology results reviewed and discussed with  patient. No need for adjuvant radiation or chemotherapy.  Follow-up with surgery.  Acute bronchitis At a risk of QT plantation.  I will avoid azithromycin.  Recommend Augmentin 5 days course.     Orders Placed This Encounter  Procedures   MM 3D DIAGNOSTIC MAMMOGRAM UNILATERAL LEFT BREAST    Standing Status:   Future    Expected Date:   05/22/2023    Expiration Date:   05/14/2024    Reason for Exam (SYMPTOM  OR DIAGNOSIS REQUIRED):   breast cancer    Preferred imaging location?:   [facility]   [place] LIMITED ULTRASOUND INCLUDING AXILLA LEFT BREAST     Standing Status:   Future    Expected Date:   05/22/2023    Expiration Date:   05/14/2024    Reason for Exam (SYMPTOM  OR DIAGNOSIS REQUIRED):   breast cancer    Preferred imaging location?:   [facility]   CBC with Differential (Cancer Center Only)    Standing Status:   Future    Expected Date:   07/25/2023    Expiration Date:   05/14/2024   CEA    Standing Status:   Future    Expected Date:   07/25/2023    Expiration Date:   05/14/2024   CMP (Cancer Center only)    Standing Status:   Future    Expected Date:   07/25/2023    Expiration Date:   05/14/2024   Follow-up early May All questions were answered. The patient knows to call the clinic with any problems, questions or concerns.  Rickard Patience, MD, PhD [facility] Hematology Oncology 05/15/2023        HISTORY  OF PRESENTING ILLNESS:   Yvonne Scott is a  63 y.o.  female presents for follow-up of triple negative breast cancer, lung squamous cancer and rectal cancer.  Oncology History  Invasive ductal carcinoma of right breast (HCC)  11/11/2020 Genetic Testing   Genetic testing done at [facility].  Per note, negative.   02/20/2021 Initial Diagnosis   Invasive ductal carcinoma of right breast (HCC)\  -August 2022, patient was diagnosed with right breast stage IIIb cT3 N0M0, grade 2, ER negative, PR negative HER2 negative breast cancer.  Patient self palpated breast mass many  years ago.  10/21/2020 diagnostic mammogram and ultrasound confirmed presence of mass and a subsequent biopsy revealed right breast triple negative cancer. There was plan for patient to start with neoadjuvant chemotherapy followed by surgery and radiation.  Unfortunately patient never started treatment.    Medi port was placed at [facility].  Established care with me on 02/19/2021    02/24/2021 Imaging   CT chest abdomen pelvis showed large right breast mass, 5.5 x 4.8 cm with small right axillary lymph node with variable degrees of enhancement potentially involved but not specific based on size.  This tracked down to retropectoral nodal stations largest lymph node along the lateral margin of the pectoralis major.  Tiny pulmonary nodules in the chest as discussed.  These are nonspecific but would consider short interval follow-up to assess for any changes.  No evidence of metastatic disease involving the abdomen or pelvis.  Aortic atherosclerosis.   02/25/2021 Echocardiogram   echocardiogram showed LVEF 65 to 70%.  Patient has been to chemotherapy class and understands antiemetics instructions   03/11/2021 - 08/26/2021 Chemotherapy   BREAST Pembrolizumab (200) D1 + Carboplatin (5) D1 + Paclitaxel (80) D1,8,15 q21d X 4 cycles, followed by Pembrolizumab (200) D1 + AC D1 q21d x 3 cycles. 4th cycle was omitted due to poor tolerance.     03/16/2021 Imaging   bone scan showed no evidence of osseous metastatic breast cancer, asymmetric osseous uptake on the right at L5-S1, corresponding to degenerative disc disease on recent CT.  Asymmetric soft tissue uptake in the right breast   07/13/2021 Echocardiogram   Echocardiogram showed LVEF 60-65%.  Grade 1 diastolic dysfunction.  Refer details to echocardiogram reports.   10/19/2021 Surgery   Patient underwent right modified radical mastectomy  Pathology showed invasive mammary carcinoma, no special type, multifocal, with focal chondroid stroma.  Clip and biopsy sites  identified.  12 lymph nodes negative for malignancy.  2 lymph nodes displaying dense fibrous scarring, compatible with pathological complete response.  Benign nipple/areola.   Grade 3, LVI not identified, DCIS not identified.  All margins negative for invasive carcinoma.   ypT2 ypN0  Comment Due to the presence of dense fibrosis and treatment effect in two of the sampled lymph nodes, pancytokeratin stains were performed on all submitted lymph nodes.  All lymph nodes are negative for residual metastatic carcinoma.  A separate focus of invasive carcinoma, measuring approximately 11 mm in greatest extent, and histologically similar to the primary tumor, is identified within sampling of the fibrous tissue adjacent to the primary tumor.    10/19/2021 Cancer Staging   Staging form: Breast, AJCC 8th Edition - Pathologic stage from 10/19/2021: No Stage Recommended (ypT2, pN0, cM0, G3, ER-, PR-, HER2-) - Signed by Rickard Patience, MD on 11/03/2021 Stage prefix: Post-therapy Response to neoadjuvant therapy: Partial response Multigene prognostic tests performed: None Histologic grading system: 3 grade system   05/27/2022 Imaging   CT chest  abdomen pelvis with contrast showed 1. Interval right mastectomy with resolution of suspicious right axillary nodes. 2. Enlargement of a 5 mm right lower lobe pulmonary nodule. Development of a 5 mm left upper lobe pulmonary nodule. Cannot exclude early metastatic disease. Consider chest CT follow-up at 3 months. 3. Otherwise, no evidence of metastatic disease in the chest, abdomen, or pelvis. 4. Age advanced coronary artery atherosclerosis. Recommend assessment of coronary risk factors. 5. Aortic atherosclerosis (ICD10-I70.0) and emphysema (ICD10-J43.9). 6. Mild enlargement of the common duct, which is now minimally dilated for age. Correlate with bilirubin levels. If elevated,consider ERCP.   09/09/2022 Imaging   CT chest without contrast showed 1. Interval growth of  three solid bilateral pulmonary nodules since 05/25/2022 chest CT, largest 0.9 cm in the anteromedial left upper lobe, highly suspicious for pulmonary metastases. 2. No thoracic adenopathy. 3. Three-vessel coronary atherosclerosis. 4. Aortic Atherosclerosis (ICD10-I70.0) and Emphysema (ICD10-J43.9).     09/29/2022 Imaging   PET scan shows 1. Hypermetabolic bilateral pulmonary nodules, consistent with metastasis. 2. No other evidence of tracer avid metastatic disease, status post right mastectomy. 3. Hypermetabolism with suggestion of soft tissue fullness in the mid rectum, suspicious for polyp or carcinoma. Consider further evaluation with sigmoidoscopy. 4. Incidental findings, including: Aortic atherosclerosis (ICD10-I70.0), coronary artery atherosclerosis and emphysema (ICD10-J43.9).   Squamous cell lung cancer, right (HCC)  08/12/2022 Initial Diagnosis   Squamous cell lung cancer, right (HCC)   01/10/2023 Cancer Staging   Staging form: Lung, AJCC 8th Edition - Clinical: cT1, cN0, cM0 - Signed by Rickard Patience, MD on 01/10/2023 Stage prefix: Initial diagnosis   Rectal adenocarcinoma (HCC)  10/13/2022 Initial Diagnosis   Rectal adenocarcinoma (HCC)  patient has large polyp and biopsy showed rectal adenocarcinoma-at least intramucosal. She was also recommended to have bronchoscopy ASAP for further evaluation of the lung nodules.  Patient adamantly declined pulmonology workup concurrently with colonoscopy.  She eventually underwent pulmonology workup with bronchoscopy biopsy of right upper lobe nodule and the results showed squamous cell carcinoma.   lingula nodule was not biopsied due to location and threat for pneumothorax.  Denies hematochezia,  black tarry stool or abdominal pain, change of bowel habits.   01/16/2023 Imaging   MRI pelvis without contrast showed 4.1 cm upper/mid rectal mass, as above.   Rectal adenocarcinoma T stage: T2.   Rectal adenocarcinoma N stage: N0.   Distance  from tumor to the internal anal sphincter is 7.0 cm.   04/21/2023 Surgery   Patient underwent distal sigmoid colon and rectum low anterior resection Pathology showed Tubulovillous adenoma with high-grade dysplasia/intramucosal carcinoma  (pTis)  Negative for invasion  Tumor measures 2.6 x 2.3 x 2.1 cm  Margins free of adenomatous change and carcinoma  Eight benign perirectal lymph nodes (0/8, pN0  Final distal margin benign: Negative for dysplasia and carcinoma.    05/15/2023 Cancer Staging   Staging form: Colon and Rectum, AJCC 8th Edition - Clinical stage from 05/15/2023: Stage 0 (cTis, cN0, cM0) - Signed by Rickard Patience, MD on 05/15/2023 Stage prefix: Initial diagnosis Total positive nodes: 0    07/16/2021-07/19/2021, patient was admitted for HCAP patient was treated with antibiotics 09/04/2021 - 09/08/2021 Patient was admitted due to pancytopenia, thrush electrolyte abnormality.     INTERVAL HISTORY Yvonne Scott is a 63 y.o. female who has above history reviewed by me today presents for follow up visit for management of triple negative breast cancer, lung squamous cell carcinoma, stage 0 rectal adenocarcinoma   Patient continues to smoke on  some days.   Status post rectal mass resection.  Patient reports mild soreness around her colostomy site. She reports productive cough with yellowish sputum, since her discharge.  She has tried over-the-counter Mucinex which has not helped her.  No fever or chills.    Review of Systems  Constitutional:  Positive for fatigue. Negative for appetite change, chills and fever.  HENT:   Negative for hearing loss, mouth sores and voice change.   Eyes:  Negative for eye problems.  Respiratory:  Negative for chest tightness and cough.   Cardiovascular:  Negative for chest pain.  Gastrointestinal:  Negative for abdominal distention, abdominal pain, blood in stool, diarrhea and nausea.  Endocrine: Negative for hot flashes.  Genitourinary:  Negative for  difficulty urinating, dysuria and frequency.   Musculoskeletal:  Positive for arthralgias and back pain.  Skin:  Negative for itching and rash.  Neurological:  Positive for numbness. Negative for extremity weakness.  Hematological:  Negative for adenopathy.  Psychiatric/Behavioral:  Negative for confusion. The patient is nervous/anxious.     MEDICAL HISTORY:  Past Medical History:  Diagnosis Date   Acute kidney injury (HCC)    Anxiety    Aortic atherosclerosis (HCC) 10/24/2016   Breast cancer (HCC)    colon cancer   Chemotherapy-induced neuropathy (HCC)    Depression    Dyspnea    GERD (gastroesophageal reflux disease)    Hypokalemia    Hypomagnesemia    Invasive ductal carcinoma of right breast (HCC)    Metabolic acidosis    Personal history of chemotherapy    Pneumonia    Severe protein-calorie malnutrition (HCC)    Thrombocytopenia (HCC)     SURGICAL HISTORY: Past Surgical History:  Procedure Laterality Date   BREAST BIOPSY Right 2022   BRONCHIAL BIOPSY  12/29/2022   Procedure: BRONCHIAL BIOPSIES;  Surgeon: Raechel Chute, MD;  Location: [facility];  Service: Pulmonary;;   BRONCHIAL NEEDLE ASPIRATION BIOPSY  12/29/2022   Procedure: BRONCHIAL NEEDLE ASPIRATION BIOPSIES;  Surgeon: Raechel Chute, MD;  Location: [facility];  Service: Pulmonary;;   COLONOSCOPY WITH PROPOFOL N/A 12/07/2022   Procedure: COLONOSCOPY WITH PROPOFOL;  Surgeon: Toney Reil, MD;  Location: [facility] ENDOSCOPY;  Service: Gastroenterology;  Laterality: N/A;   FLEXIBLE SIGMOIDOSCOPY N/A 04/21/2023   Procedure: FLEXIBLE SIGMOIDOSCOPY, ICG PERFUSION;  Surgeon: Andria Meuse, MD;  Location: [facility];  Service: General;  Laterality: N/A;   FOOT SURGERY Left    little toe correction   MASTECTOMY W/ SENTINEL NODE BIOPSY Right 10/19/2021   Procedure: MASTECTOMY WITH SENTINEL LYMPH NODE BIOPSY ( modified radical);  Surgeon: Carolan Shiver, MD;  Location: [facility] ORS;  Service: General;   Laterality: Right;   POLYPECTOMY  12/07/2022   Procedure: POLYPECTOMY;  Surgeon: Toney Reil, MD;  Location: [facility] ENDOSCOPY;  Service: Gastroenterology;;   PORTA CATH INSERTION  01/19/2021   SUBMUCOSAL TATTOO INJECTION  12/07/2022   Procedure: SUBMUCOSAL TATTOO INJECTION;  Surgeon: Toney Reil, MD;  Location: [facility] ENDOSCOPY;  Service: Gastroenterology;;   VIDEO BRONCHOSCOPY WITH ENDOBRONCHIAL ULTRASOUND N/A 12/29/2022   Procedure: VIDEO BRONCHOSCOPY WITH ENDOBRONCHIAL ULTRASOUND;  Surgeon: Raechel Chute, MD;  Location: [facility];  Service: Pulmonary;  Laterality: N/A;   XI ROBOTIC ASSISTED LOWER ANTERIOR RESECTION N/A 04/21/2023   Procedure: XI ROBOTIC ASSISTED LOWER ANTERIOR RESECTION, ILEOSTOMY CREATION;  Surgeon: Andria Meuse, MD;  Location: [facility];  Service: General;  Laterality: N/A;    SOCIAL HISTORY: Social History   Socioeconomic History   Marital status: Widowed  Spouse name: Not on file   Number of children: 1   Years of education: Not on file   Highest education level: Not on file  Occupational History   Not on file  Tobacco Use   Smoking status: Every Day    Types: Cigarettes   Smokeless tobacco: Never  Vaping Use   Vaping status: Never Used  Substance and Sexual Activity   Alcohol use: Never    Alcohol/week: 3.0 standard drinks of alcohol    Types: 3 Standard drinks or equivalent per week   Drug use: Never   Sexual activity: Not Currently    Birth control/protection: None  Other Topics Concern   Not on file  Social History Narrative   Lives with mother   Social Drivers of Health   Financial Resource Strain: Low Risk  (01/06/2022)   Overall Financial Resource Strain (CARDIA)    Difficulty of Paying Living Expenses: Not very hard  Food Insecurity: No Food Insecurity (04/23/2023)   Hunger Vital Sign    Worried About Running Out of Food in the Last Year: Never true    Ran Out of Food in the Last Year: Never true  Transportation Needs:  No Transportation Needs (04/23/2023)   PRAPARE - Administrator, Civil Service (Medical): No    Lack of Transportation (Non-Medical): No  Physical Activity: Inactive (01/06/2022)   Exercise Vital Sign    Days of Exercise per Week: 0 days    Minutes of Exercise per Session: 0 min  Stress: Stress Concern Present (01/06/2022)   [employer] of Occupational Health - Occupational Stress Questionnaire    Feeling of Stress : Rather much  Social Connections: Moderately Isolated (01/06/2022)   Social Connection and Isolation Panel [NHANES]    Frequency of Communication with Friends and Family: Once a week    Frequency of Social Gatherings with Friends and Family: Once a week    Attends Religious Services: 1 to 4 times per year    Active Member of [employer] or Organizations: Yes    Attends Banker Meetings: Never    Marital Status: Widowed  Intimate Partner Violence: Not At Risk (04/23/2023)   Humiliation, Afraid, Rape, and Kick questionnaire    Fear of Current or Ex-Partner: No    Emotionally Abused: No    Physically Abused: No    Sexually Abused: No    FAMILY HISTORY: Family History  Problem Relation Age of Onset   Melanoma Father 13   Cirrhosis Maternal Grandfather    Breast cancer Paternal Grandmother     ALLERGIES:  has no known allergies.  MEDICATIONS:  Current Outpatient Medications  Medication Sig Dispense Refill   amoxicillin-clavulanate (AUGMENTIN) 875-125 MG tablet Take 1 tablet by mouth 2 (two) times daily. 10 tablet 0   budesonide-formoterol (SYMBICORT) 160-4.5 MCG/ACT inhaler Inhale 2 puffs into the lungs 2 (two) times daily. 1 each 12   calcium carbonate (TUMS EX) 750 MG chewable tablet Chew 2 tablets by mouth 4 (four) times daily as needed for heartburn.     dicyclomine (BENTYL) 10 MG capsule Take 1 capsule (10 mg total) by mouth 4 (four) times daily -  before meals and at bedtime. 30 capsule 0   fluticasone (FLONASE) 50 MCG/ACT nasal spray Place  1 spray into both nostrils daily. (Patient taking differently: Place 1 spray into both nostrils in the morning and at bedtime.) 1 g 11   ibuprofen (ADVIL) 200 MG tablet Take 200-400 mg by mouth every 6 (six)  hours as needed for headache.     lidocaine-prilocaine (EMLA) cream APPLY TO AFFECTED AREA ONCE (Patient taking differently: Apply 1 Application topically as needed (for port access).) 30 g 0   magnesium chloride (SLOW-MAG) 64 MG TBEC SR tablet Take 1 tablet (64 mg total) by mouth 2 (two) times daily. (Patient taking differently: Take 2 tablets by mouth in the morning.) 60 tablet 2   omeprazole (PRILOSEC) 40 MG capsule Take 1 capsule (40 mg total) by mouth daily. (Patient taking differently: Take 40 mg by mouth See admin instructions. Take 40 mg by mouth 30 minutes prior to breakfast and an additional 40 mg once a day as needed for unresolved heartburn or reflux) 90 capsule 0   polycarbophil (FIBERCON) 625 MG tablet Take 1 tablet (625 mg total) by mouth 2 (two) times daily. 180 tablet 0   potassium chloride (MICRO-K) 10 MEQ CR capsule Take 1 capsule (10 mEq total) by mouth daily. (Patient taking differently: Take 10 mEq by mouth daily as needed (when deficient).) 90 capsule 1   Tiotropium Bromide Monohydrate (SPIRIVA RESPIMAT) 2.5 MCG/ACT AERS Inhale 2 puffs into the lungs daily. 4 g 11   VENTOLIN HFA 108 (90 Base) MCG/ACT inhaler INHALE 1 PUFF BY MOUTH EVERY 6 HOURS AS NEEDED FOR SHORTNESS OF BREATH FOR WHEEZING (Patient taking differently: Inhale 1-2 puffs into the lungs every 6 (six) hours as needed for shortness of breath or wheezing.) 18 g 0   ADVIL 200 MG tablet Take 600 mg by mouth every 6 (six) hours as needed for mild pain (pain score 1-3) or headache. (Patient not taking: Reported on 05/15/2023)     naloxone [facility]) nasal spray 4 mg/0.1 mL Place 0.4 mg into the nose once as needed (as directed). (Patient not taking: Reported on 05/15/2023)     No current facility-administered medications for  this visit.   Facility-Administered Medications Ordered in Other Visits  Medication Dose Route Frequency Provider Last Rate Last Admin   famotidine (PEPCID) 20-0.9 MG/50ML-% IVPB              PHYSICAL EXAMINATION: ECOG PERFORMANCE STATUS: 1 - Symptomatic but completely ambulatory Vitals:   05/15/23 1414  BP: (!) 147/87  Pulse: 98  Resp: 18  Temp: 98.3 F (36.8 C)  SpO2: 98%   Filed Weights   05/15/23 1414  Weight: 119 lb 3.2 oz (54.1 kg)    Physical Exam Constitutional:      General: She is not in acute distress. HENT:     Head: Normocephalic and atraumatic.  Eyes:     General: No scleral icterus. Cardiovascular:     Rate and Rhythm: Normal rate.  Pulmonary:     Effort: Pulmonary effort is normal. No respiratory distress.     Comments: Decreased breath sound.  Abdominal:     General: Bowel sounds are normal. There is no distension.     Palpations: Abdomen is soft.  Musculoskeletal:        General: No deformity. Normal range of motion.     Cervical back: Normal range of motion and neck supple.  Skin:    Findings: No erythema or rash.  Neurological:     Mental Status: She is alert and oriented to person, place, and time. Mental status is at baseline.      LABORATORY DATA:  I have reviewed the data as listed    Latest Ref Rng & Units 04/25/2023   10:14 AM 04/24/2023    3:18 AM 04/23/2023  3:28 AM  CBC  WBC 4.0 - 10.5 K/uL 9.0  7.7  8.7   Hemoglobin 12.0 - 15.0 g/dL 96.2  95.2  84.1   Hematocrit 36.0 - 46.0 % 36.7  31.1  32.0   Platelets 150 - 400 K/uL 218  168  171       Latest Ref Rng & Units 04/25/2023   10:14 AM 04/24/2023    3:18 AM 04/23/2023    3:28 AM  CMP  Glucose 70 - 99 mg/dL 97  97  324   BUN 8 - 23 mg/dL 7  6  7    Creatinine 0.44 - 1.00 mg/dL 4.01  0.27  2.53   Sodium 135 - 145 mmol/L 136  138  139   Potassium 3.5 - 5.1 mmol/L 3.3  3.0  2.8   Chloride 98 - 111 mmol/L 107  109  107   CO2 22 - 32 mmol/L 19  22  22    Calcium 8.9 - 10.3 mg/dL  8.2  7.7  7.7         RADIOGRAPHIC STUDIES: I have personally reviewed the radiological images as listed and agreed with the findings in the report. No results found.

## 2023-05-15 NOTE — Assessment & Plan Note (Addendum)
 Clinical stage III triple negative right breast cancer. cT3 cN0-1 S/p  neoadjuvant carboplatin Taxol Keytruda followed by [facility] with Keytruda x 3.   4th cycle of AC with Rande Lawman was not given due to poor tolerance. Status post right mastectomy with sentinel lymph node biopsy. ypT2 ypN0, residual disease, Patient declines any adjuvant chemotherapy or immunotherapy Continue clinical surveillance.  Patient is overdue for left diagnostic mammogram and ultrasound.  Will obtain. genetic counseling showed DICER VUS

## 2023-05-15 NOTE — Assessment & Plan Note (Signed)
 She will get [name] flush Q6-8 weeks

## 2023-05-29 ENCOUNTER — Telehealth: Payer: Self-pay | Admitting: *Deleted

## 2023-05-29 NOTE — Telephone Encounter (Signed)
 She wants Yvonne Scott to check her stomach instead of surgeon Marin Olp . She does not want to go [place]

## 2023-05-30 ENCOUNTER — Telehealth: Payer: Self-pay | Admitting: *Deleted

## 2023-05-30 ENCOUNTER — Encounter: Payer: Self-pay | Admitting: Oncology

## 2023-05-30 NOTE — Telephone Encounter (Signed)
 Per the patient- she states that she can go to the clinic in [place]

## 2023-05-30 NOTE — Telephone Encounter (Signed)
 Spoke to pt and informed her that Dr. Cathie Hoops recommends for her to keep appt with colorectal surgeon in [place] as there is not one local. Pt also voiced that she was having some redness and irritation to ileostomy site. She says she will keep appt with Dr. Cliffton Asters and will contact his office regarding the ileostomy site.

## 2023-06-02 ENCOUNTER — Encounter: Payer: Self-pay | Admitting: Oncology

## 2023-06-02 NOTE — Telephone Encounter (Signed)
 error

## 2023-06-12 ENCOUNTER — Inpatient Hospital Stay

## 2023-06-12 DIAGNOSIS — C50911 Malignant neoplasm of unspecified site of right female breast: Secondary | ICD-10-CM | POA: Diagnosis not present

## 2023-06-12 DIAGNOSIS — Z95828 Presence of other vascular implants and grafts: Secondary | ICD-10-CM

## 2023-06-12 MED ORDER — HEPARIN SOD (PORK) LOCK FLUSH 100 UNIT/ML IV SOLN
500.0000 [IU] | Freq: Once | INTRAVENOUS | Status: AC
  Administered 2023-06-12: 500 [IU] via INTRAVENOUS
  Filled 2023-06-12: qty 5

## 2023-06-12 MED ORDER — SODIUM CHLORIDE 0.9% FLUSH
10.0000 mL | Freq: Once | INTRAVENOUS | Status: AC
Start: 2023-06-12 — End: 2023-06-12
  Administered 2023-06-12: 10 mL via INTRAVENOUS
  Filled 2023-06-12: qty 10

## 2023-06-19 ENCOUNTER — Ambulatory Visit: Admitting: Cardiology

## 2023-06-19 ENCOUNTER — Ambulatory Visit: Payer: Medicaid Other | Admitting: Cardiology

## 2023-06-19 ENCOUNTER — Encounter: Payer: Self-pay | Admitting: Cardiology

## 2023-06-19 VITALS — BP 110/74 | HR 119 | Ht 60.0 in | Wt 109.0 lb

## 2023-06-19 DIAGNOSIS — J069 Acute upper respiratory infection, unspecified: Secondary | ICD-10-CM | POA: Insufficient documentation

## 2023-06-19 DIAGNOSIS — Z0130 Encounter for examination of blood pressure without abnormal findings: Secondary | ICD-10-CM

## 2023-06-19 MED ORDER — FLUTICASONE PROPIONATE 50 MCG/ACT NA SUSP: 1.0000 | Freq: Every day | NASAL | 11 refills | Status: DC

## 2023-06-19 MED ORDER — AMOXICILLIN-POT CLAVULANATE 875-125 MG PO TABS: 1.0000 | ORAL_TABLET | Freq: Two times a day (BID) | ORAL | 0 refills | Status: AC

## 2023-06-19 MED ORDER — METHYLPREDNISOLONE 4 MG PO TBPK: ORAL_TABLET | ORAL | 0 refills | Status: DC

## 2023-06-19 NOTE — Progress Notes (Signed)
 Established Patient Office Visit  Subjective:  Patient ID: Yvonne Scott, female    DOB: [birth year]  Age: 63 y.o. MRN: [id number]  Chief Complaint  Patient presents with   Follow-up    3 Months Lab Results/ Sore Throat, Nasal Drainage    Patient in office for an acute visit, complaining of sore throat, sinus drainage.  Patient being treated for colon breast and lung cancer. Will send in Augmentin, Flonase, and medrol dose pack. Continue Mucinex. Push fluids.   Sore Throat  This is a new problem. The current episode started in the past 7 days. The problem has been unchanged. Associated symptoms include congestion, coughing and headaches. Pertinent negatives include no abdominal pain, diarrhea or shortness of breath. She has tried nothing for the symptoms. The treatment provided no relief.    No other concerns at this time.   Past Medical History:  Diagnosis Date   Acute kidney injury (HCC)    Anxiety    Aortic atherosclerosis (HCC) 10/24/2016   Breast cancer (HCC)    colon cancer   Chemotherapy-induced neuropathy (HCC)    Depression    Dyspnea    GERD (gastroesophageal reflux disease)    Hypokalemia    Hypomagnesemia    Invasive ductal carcinoma of right breast (HCC)    Metabolic acidosis    Personal history of chemotherapy    Pneumonia    Severe protein-calorie malnutrition (HCC)    Thrombocytopenia (HCC)     Past Surgical History:  Procedure Laterality Date   BREAST BIOPSY Right 2022   BRONCHIAL BIOPSY  12/29/2022   Procedure: BRONCHIAL BIOPSIES;  Surgeon: Raechel Chute, MD;  [facility];  Service: Pulmonary;;   BRONCHIAL NEEDLE ASPIRATION BIOPSY  12/29/2022   Procedure: BRONCHIAL NEEDLE ASPIRATION BIOPSIES;  Surgeon: Raechel Chute, MD;  Location: [facility];  Service: Pulmonary;;   COLONOSCOPY WITH PROPOFOL N/A 12/07/2022   Procedure: COLONOSCOPY WITH PROPOFOL;  Surgeon: Toney Reil, MD;  Location: [facility];  Service:  Gastroenterology;  Laterality: N/A;   FLEXIBLE SIGMOIDOSCOPY N/A 04/21/2023   Procedure: FLEXIBLE SIGMOIDOSCOPY, ICG PERFUSION;  Surgeon: Andria Meuse, MD;  Location: [facility];  Service: General;  Laterality: N/A;   FOOT SURGERY Left    little toe correction   MASTECTOMY W/ SENTINEL NODE BIOPSY Right 10/19/2021   Procedure: MASTECTOMY WITH SENTINEL LYMPH NODE BIOPSY ( modified radical);  Surgeon: Carolan Shiver, MD;  Location: [facility];  Service: General;  Laterality: Right;   POLYPECTOMY  12/07/2022   Procedure: POLYPECTOMY;  Surgeon: Toney Reil, MD;  Location: [facility];  Service: Gastroenterology;;   PORTA CATH INSERTION  01/19/2021   SUBMUCOSAL TATTOO INJECTION  12/07/2022   Procedure: SUBMUCOSAL TATTOO INJECTION;  Surgeon: Toney Reil, MD;  Location: [facility];  Service: Gastroenterology;;   VIDEO BRONCHOSCOPY WITH ENDOBRONCHIAL ULTRASOUND N/A 12/29/2022   Procedure: VIDEO BRONCHOSCOPY WITH ENDOBRONCHIAL ULTRASOUND;  Surgeon: Raechel Chute, MD;  [facility];  Service: Pulmonary;  Laterality: N/A;   XI ROBOTIC ASSISTED LOWER ANTERIOR RESECTION N/A 04/21/2023   Procedure: XI ROBOTIC ASSISTED LOWER ANTERIOR RESECTION, ILEOSTOMY CREATION;  Surgeon: Andria Meuse, MD;  Location: [facility];  Service: General;  Laterality: N/A;    Social History   Socioeconomic History   Marital status: Widowed    Spouse name: Not on file   Number of children: 1   Years of education: Not on file   Highest education level: Not on file  Occupational History   Not on file  Tobacco  Use   Smoking status: Every Day    Types: Cigarettes   Smokeless tobacco: Never  Vaping Use   Vaping status: Never Used  Substance and Sexual Activity   Alcohol use: Never    Alcohol/week: 3.0 standard drinks of alcohol    Types: 3 Standard drinks or equivalent per week   Drug use: Never   Sexual activity: Not Currently    Birth control/protection: None  Other Topics  Concern   Not on file  Social History Narrative   Lives with mother   Social Drivers of Health   Financial Resource Strain: Low Risk  (01/06/2022)   Overall Financial Resource Strain (CARDIA)    Difficulty of Paying Living Expenses: Not very hard  Food Insecurity: No Food Insecurity (04/23/2023)   Hunger Vital Sign    Worried About Running Out of Food in the Last Year: Never true    Ran Out of Food in the Last Year: Never true  Transportation Needs: No Transportation Needs (04/23/2023)   PRAPARE - Administrator, Civil Service (Medical): No    Lack of Transportation (Non-Medical): No  Physical Activity: Inactive (01/06/2022)   Exercise Vital Sign    Days of Exercise per Week: 0 days    Minutes of Exercise per Session: 0 min  Stress: Stress Concern Present (01/06/2022)   [employer] of Occupational Health - Occupational Stress Questionnaire    Feeling of Stress : Rather much  Social Connections: Moderately Isolated (01/06/2022)   Social Connection and Isolation Panel [NHANES]    Frequency of Communication with Friends and Family: Once a week    Frequency of Social Gatherings with Friends and Family: Once a week    Attends Religious Services: 1 to 4 times per year    Active Member of [employer] or Organizations: Yes    Attends Banker Meetings: Never    Marital Status: Widowed  Intimate Partner Violence: Not At Risk (04/23/2023)   Humiliation, Afraid, Rape, and Kick questionnaire    Fear of Current or Ex-Partner: No    Emotionally Abused: No    Physically Abused: No    Sexually Abused: No    Family History  Problem Relation Age of Onset   Melanoma Father 54   Cirrhosis Maternal Grandfather    Breast cancer Paternal Grandmother     No Known Allergies  Outpatient Medications Prior to Visit  Medication Sig   ADVIL 200 MG tablet Take 600 mg by mouth every 6 (six) hours as needed for mild pain (pain score 1-3) or headache. (Patient not taking: Reported  on 05/15/2023)   budesonide-formoterol (SYMBICORT) 160-4.5 MCG/ACT inhaler Inhale 2 puffs into the lungs 2 (two) times daily.   calcium carbonate (TUMS EX) 750 MG chewable tablet Chew 2 tablets by mouth 4 (four) times daily as needed for heartburn.   dicyclomine (BENTYL) 10 MG capsule Take 1 capsule (10 mg total) by mouth 4 (four) times daily -  before meals and at bedtime.   ibuprofen (ADVIL) 200 MG tablet Take 200-400 mg by mouth every 6 (six) hours as needed for headache.   lidocaine-prilocaine (EMLA) cream APPLY TO AFFECTED AREA ONCE (Patient taking differently: Apply 1 Application topically as needed (for port access).)   magnesium chloride (SLOW-MAG) 64 MG TBEC SR tablet Take 1 tablet (64 mg total) by mouth 2 (two) times daily. (Patient taking differently: Take 2 tablets by mouth in the morning.)   naloxone (NARCAN) nasal spray 4 mg/0.1 mL Place 0.4 mg into  the nose once as needed (as directed). (Patient not taking: Reported on 05/15/2023)   omeprazole (PRILOSEC) 40 MG capsule Take 1 capsule (40 mg total) by mouth daily. (Patient taking differently: Take 40 mg by mouth See admin instructions. Take 40 mg by mouth 30 minutes prior to breakfast and an additional 40 mg once a day as needed for unresolved heartburn or reflux)   polycarbophil (FIBERCON) 625 MG tablet Take 1 tablet (625 mg total) by mouth 2 (two) times daily.   potassium chloride (MICRO-K) 10 MEQ CR capsule Take 1 capsule (10 mEq total) by mouth daily. (Patient taking differently: Take 10 mEq by mouth daily as needed (when deficient).)   Tiotropium Bromide Monohydrate (SPIRIVA RESPIMAT) 2.5 MCG/ACT AERS Inhale 2 puffs into the lungs daily.   VENTOLIN HFA 108 (90 Base) MCG/ACT inhaler INHALE 1 PUFF BY MOUTH EVERY 6 HOURS AS NEEDED FOR SHORTNESS OF BREATH FOR WHEEZING (Patient taking differently: Inhale 1-2 puffs into the lungs every 6 (six) hours as needed for shortness of breath or wheezing.)   [DISCONTINUED] amoxicillin-clavulanate  (AUGMENTIN) 875-125 MG tablet Take 1 tablet by mouth 2 (two) times daily. (Patient not taking: Reported on 06/19/2023)   [DISCONTINUED] fluticasone (FLONASE) 50 MCG/ACT nasal spray Place 1 spray into both nostrils daily. (Patient taking differently: Place 1 spray into both nostrils in the morning and at bedtime.)   Facility-Administered Medications Prior to Visit  Medication Dose Route Frequency Provider   famotidine (PEPCID) 20-0.9 MG/50ML-% IVPB         Review of Systems  Constitutional: Negative.   HENT:  Positive for congestion. Negative for sinus pain.   Eyes: Negative.   Respiratory:  Positive for cough and sputum production. Negative for shortness of breath.   Cardiovascular: Negative.  Negative for chest pain.  Gastrointestinal: Negative.  Negative for abdominal pain, constipation and diarrhea.  Genitourinary: Negative.   Musculoskeletal:  Negative for joint pain and myalgias.  Skin: Negative.   Neurological:  Positive for headaches. Negative for dizziness.  Endo/Heme/Allergies: Negative.   All other systems reviewed and are negative.      Objective:   BP 110/74   Pulse (!) 119   Ht 5' (1.524 m)   Wt 109 lb (49.4 kg)   LMP 11/19/2002 (Approximate)   SpO2 98%   BMI 21.29 kg/m   Vitals:   06/19/23 1355  BP: 110/74  Pulse: (!) 119  Height: 5' (1.524 m)  Weight: 109 lb (49.4 kg)  SpO2: 98%  BMI (Calculated): 21.29    Physical Exam Vitals and nursing note reviewed.  Constitutional:      Appearance: Normal appearance. She is normal weight.  HENT:     Head: Normocephalic and atraumatic.     Nose: Nose normal.     Mouth/Throat:     Mouth: Mucous membranes are moist.  Eyes:     Extraocular Movements: Extraocular movements intact.     Conjunctiva/sclera: Conjunctivae normal.     Pupils: Pupils are equal, round, and reactive to light.  Cardiovascular:     Rate and Rhythm: Normal rate and regular rhythm.     Pulses: Normal pulses.     Heart sounds: Normal heart  sounds.  Pulmonary:     Effort: Pulmonary effort is normal.     Breath sounds: Normal breath sounds.  Abdominal:     General: Abdomen is flat. Bowel sounds are normal.     Palpations: Abdomen is soft.  Musculoskeletal:        General: Normal range  of motion.     Cervical back: Normal range of motion.  Skin:    General: Skin is warm and dry.  Neurological:     General: No focal deficit present.     Mental Status: She is alert and oriented to person, place, and time.  Psychiatric:        Mood and Affect: Mood normal.        Behavior: Behavior normal.        Thought Content: Thought content normal.        Judgment: Judgment normal.      No results found for any visits on 06/19/23.  Recent Results (from the past 2160 hours)  CBC with Differential     Status: None   Collection Time: 04/14/23  1:26 PM  Result Value Ref Range   WBC 7.7 4.0 - 10.5 K/uL   RBC 4.18 3.87 - 5.11 MIL/uL   Hemoglobin 13.6 12.0 - 15.0 g/dL   HCT 81.1 91.4 - 78.2 %   MCV 97.6 80.0 - 100.0 fL   MCH 32.5 26.0 - 34.0 pg   MCHC 33.3 30.0 - 36.0 g/dL   RDW 95.6 21.3 - 08.6 %   Platelets 255 150 - 400 K/uL   nRBC 0.0 0.0 - 0.2 %   Neutrophils Relative % 59 %   Neutro Abs 4.6 1.7 - 7.7 K/uL   Lymphocytes Relative 29 %   Lymphs Abs 2.2 0.7 - 4.0 K/uL   Monocytes Relative 8 %   Monocytes Absolute 0.6 0.1 - 1.0 K/uL   Eosinophils Relative 3 %   Eosinophils Absolute 0.2 0.0 - 0.5 K/uL   Basophils Relative 1 %   Basophils Absolute 0.1 0.0 - 0.1 K/uL   Immature Granulocytes 0 %   Abs Immature Granulocytes 0.01 0.00 - 0.07 K/uL    Comment: Performed at [facility], [address] [place], [place] [address]  Comprehensive metabolic panel     Status: Abnormal   Collection Time: 04/14/23  1:26 PM  Result Value Ref Range   Sodium 140 135 - 145 mmol/L   Potassium 3.3 (L) 3.5 - 5.1 mmol/L   Chloride 105 98 - 111 mmol/L   CO2 22 22 - 32 mmol/L   Glucose, Bld 98 70 - 99 mg/dL    Comment:  Glucose reference range applies only to samples taken after fasting for at least 8 hours.   BUN 6 (L) 8 - 23 mg/dL   Creatinine, Ser 9.62 0.44 - 1.00 mg/dL   Calcium 9.0 8.9 - 95.2 mg/dL   Total Protein 6.6 6.5 - 8.1 g/dL   Albumin 3.7 3.5 - 5.0 g/dL   AST 18 15 - 41 U/L   ALT 10 0 - 44 U/L   Alkaline Phosphatase 74 38 - 126 U/L   Total Bilirubin 0.6 0.0 - 1.2 mg/dL   GFR, Estimated >84 >13 mL/min    Comment: (NOTE) Calculated using the CKD-EPI Creatinine Equation (2021)    Anion gap 13 5 - 15    Comment: Performed at [facility], [address] [place], [place] [address]  Type and screen     Status: None   Collection Time: 04/14/23  1:26 PM  Result Value Ref Range   ABO/RH(D) O POS    Antibody Screen NEG    Sample Expiration 04/24/2023,2359    Extend sample reason      NO TRANSFUSIONS OR PREGNANCY IN THE PAST 3 MONTHS Performed at [facility], [address] [place], [place] [address]   Surgical pathology     Status: None   Collection Time: 04/21/23  1:41 PM  Result Value Ref Range   SURGICAL PATHOLOGY      SURGICAL PATHOLOGY CASE: [id number] PATIENT: Juluis Pitch Surgical Pathology Report     Clinical History: rectal cancer     FINAL MICROSCOPIC DIAGNOSIS:  A. DISTAL SIGMOID COLON AND RECTUM, LOW ANTERIOR RESECTION: Tubulovillous adenoma with high-grade dysplasia/intramucosal carcinoma (pTis) Negative for invasion Tumor measures 2.6 x 2.3 x 2.1 cm Margins free of adenomatous change and carcinoma Eight benign perirectal lymph nodes (0/8, pN0)  B. FINAL DISTAL MARGIN: Benign colon Negative for dysplasia and carcinoma   GROSS DESCRIPTION:  A. Specimen: "Rectum and distal sigmoid colon open end proximal". Specimen integrity: Received previously opened. Specimen length: 13.5 cm Mesorectal intactness: Intact. Inking: Posterior mesorectum = black, anterior mesorectum = blue  Tumor location: Rectum Tumor  size: 2.6 x 2.3 x 2.1 cm Percent of bowel circumference involved: 30% Tumor distance to margins:                      Proximal: 10.5 cm                       Distal: 0.4 cm                      Radial (posterior ascending, posterior descending; lateral and posterior mid-rectum; and entire lower 1/3 rectum): 0.7 cm Macroscopic extent of tumor invasion: Muscularis propria.  Total presumed lymph nodes: 50, 0.2-0.6 cm. Extramural satellite tumor nodules: None. Mucosal polyp(s): 2 polypoid lesions are identified in the rectosigmoid junction, 0.3 cm each. Additional findings: None.  Block summary: A1-A5: Tumor, submitted entirely (A1 contains closest mucosal margin, perpendicular) A6: Closest vascular margin, en face A7: Mucosal polypoid lesions, submitted entirely A8: Proximal margin, en face A9-A10: 6 complete lymph nodes in each A11-A14: 5 complete lymph nodes in each A15: 4 complete lymph nodes A16-A18: 3 complete lymph nodes in each A19: 2 complete lymph nodes A20-A22: 1 bisected lymph node in each  B.  "Final distal margin", received in formalin is a 2.3 x 1.0 x 0.6 cm portion of colon with an embedded sta pled margin.  The specimen is submitted entirely in block B1.  SMB 04/24/23   Final Diagnosis performed by Jerene Bears, MD.   Electronically signed 04/25/2023 Technical component performed at [facility], [address], [place], [place] [address].  Professional component performed at [employer]. [facility], [address], [place], [place] [address].  Immunohistochemistry Technical component (if applicable) was performed at [facility]. [address], [place], [place] [address].   IMMUNOHISTOCHEMISTRY DISCLAIMER (if applicable): Some of these immunohistochemical stains may have been developed and the performance characteristics determine by [facility]. Some may not have been cleared or approved  by the U.S. Food and Drug Administration. The FDA has determined that such clearance or approval is not necessary. This test is used for clinical purposes. It should not be regarded as investigational  or for research. This laboratory is certified under the Clinical Laboratory Improvement Amendments of 1988 (CLIA-88) as qualified to perform high complexity clinical laboratory testing.  The controls stained appropriately.   IHC stains are performed on formalin fixed, paraffin embedded tissue using a 3,3"diaminobenzidine (DAB) chromogen and Leica Bond Autostainer System. The staining intensity of the nucleus  is score manually and is reported as the percentage of tumor cell nuclei demonstrating specific nuclear staining. The specimens are fixed in 10% Neutral Formalin for at least 6 hours and up to 72hrs. These tests are validated on decalcified tissue. Results should be interpreted with caution given the possibility of false negative results on decalcified specimens. Antibody Clones are as follows ER-clone 57F, PR-clone 16, Ki67- clone MM1. Some of these immunohistochemical stains may have been developed and the performance characteristics determined by [facility] y.   CBC     Status: Abnormal   Collection Time: 04/21/23  4:52 PM  Result Value Ref Range   WBC 10.9 (H) 4.0 - 10.5 K/uL   RBC 4.15 3.87 - 5.11 MIL/uL   Hemoglobin 13.6 12.0 - 15.0 g/dL   HCT 16.1 09.6 - 04.5 %   MCV 96.6 80.0 - 100.0 fL   MCH 32.8 26.0 - 34.0 pg   MCHC 33.9 30.0 - 36.0 g/dL   RDW 40.9 81.1 - 91.4 %   Platelets 192 150 - 400 K/uL   nRBC 0.0 0.0 - 0.2 %    Comment: Performed at [facility], [address] [place], [place] [address]  Creatinine, serum     Status: None   Collection Time: 04/21/23  4:52 PM  Result Value Ref Range   Creatinine, Ser 0.88 0.44 - 1.00 mg/dL   GFR, Estimated >62 >13 mL/min    Comment: (NOTE) Calculated using the CKD-EPI Creatinine Equation  (2021) Performed at [facility], [address], [place], [place] [address]   CBC     Status: Abnormal   Collection Time: 04/22/23  5:35 AM  Result Value Ref Range   WBC 11.1 (H) 4.0 - 10.5 K/uL   RBC 4.05 3.87 - 5.11 MIL/uL   Hemoglobin 12.7 12.0 - 15.0 g/dL   HCT 84.6 96.2 - 95.2 %   MCV 95.8 80.0 - 100.0 fL   MCH 31.4 26.0 - 34.0 pg   MCHC 32.7 30.0 - 36.0 g/dL   RDW 84.1 32.4 - 40.1 %   Platelets 184 150 - 400 K/uL   nRBC 0.0 0.0 - 0.2 %    Comment: Performed at [facility], [address], [place], [place] [address]  Basic metabolic panel     Status: Abnormal   Collection Time: 04/22/23  5:35 AM  Result Value Ref Range   Sodium 133 (L) 135 - 145 mmol/L   Potassium 3.9 3.5 - 5.1 mmol/L   Chloride 99 98 - 111 mmol/L   CO2 21 (L) 22 - 32 mmol/L   Glucose, Bld 86 70 - 99 mg/dL    Comment: Glucose reference range applies only to samples taken after fasting for at least 8 hours.   BUN 8 8 - 23 mg/dL   Creatinine, Ser 3.66 0.44 - 1.00 mg/dL   Calcium 7.8 (L) 8.9 - 10.3 mg/dL   GFR, Estimated >44 >03 mL/min    Comment: (NOTE) Calculated using the CKD-EPI Creatinine Equation (2021)    Anion gap 13 5 - 15    Comment: Performed at [facility], [address] [place], [place] [address]  CBC     Status: Abnormal   Collection Time: 04/23/23  3:28 AM  Result Value Ref Range   WBC 8.7 4.0 - 10.5 K/uL   RBC 3.37 (L) 3.87 - 5.11 MIL/uL   Hemoglobin 11.0 (L) 12.0 - 15.0 g/dL   HCT 95.6 (L) 38.7 -  46.0 %   MCV 95.0 80.0 - 100.0 fL   MCH 32.6 26.0 - 34.0 pg   MCHC 34.4 30.0 - 36.0 g/dL   RDW 65.7 84.6 - 96.2 %   Platelets 171 150 - 400 K/uL   nRBC 0.0 0.0 - 0.2 %    Comment: Performed at [facility], [address] [place], [place] [address]  Basic metabolic panel     Status: Abnormal   Collection Time: 04/23/23  3:28 AM  Result Value Ref Range   Sodium 139 135 - 145 mmol/L   Potassium 2.8 (L) 3.5  - 5.1 mmol/L   Chloride 107 98 - 111 mmol/L   CO2 22 22 - 32 mmol/L   Glucose, Bld 100 (H) 70 - 99 mg/dL    Comment: Glucose reference range applies only to samples taken after fasting for at least 8 hours.   BUN 7 (L) 8 - 23 mg/dL   Creatinine, Ser 1.32 0.44 - 1.00 mg/dL   Calcium 7.7 (L) 8.9 - 10.3 mg/dL   GFR, Estimated >44 >01 mL/min    Comment: (NOTE) Calculated using the CKD-EPI Creatinine Equation (2021)    Anion gap 10 5 - 15    Comment: Performed at [facility], [address] [place], [place] [address]  CBC     Status: Abnormal   Collection Time: 04/24/23  3:18 AM  Result Value Ref Range   WBC 7.7 4.0 - 10.5 K/uL   RBC 3.27 (L) 3.87 - 5.11 MIL/uL   Hemoglobin 10.3 (L) 12.0 - 15.0 g/dL   HCT 36.6 (L) 44.0 - 34.7 %   MCV 95.1 80.0 - 100.0 fL   MCH 31.5 26.0 - 34.0 pg   MCHC 33.1 30.0 - 36.0 g/dL   RDW 42.5 (H) 95.6 - 38.7 %   Platelets 168 150 - 400 K/uL   nRBC 0.0 0.0 - 0.2 %    Comment: Performed at [facility], [address] [place], [place] [address]  Basic metabolic panel     Status: Abnormal   Collection Time: 04/24/23  3:18 AM  Result Value Ref Range   Sodium 138 135 - 145 mmol/L   Potassium 3.0 (L) 3.5 - 5.1 mmol/L   Chloride 109 98 - 111 mmol/L   CO2 22 22 - 32 mmol/L   Glucose, Bld 97 70 - 99 mg/dL    Comment: Glucose reference range applies only to samples taken after fasting for at least 8 hours.   BUN 6 (L) 8 - 23 mg/dL   Creatinine, Ser 2.95 0.44 - 1.00 mg/dL   Calcium 7.7 (L) 8.9 - 10.3 mg/dL   GFR, Estimated >18 >84 mL/min    Comment: (NOTE) Calculated using the CKD-EPI Creatinine Equation (2021)    Anion gap 7 5 - 15    Comment: Performed at [facility], [address], [place], [place] [address]  CBC with Differential/Platelet     Status: Abnormal   Collection Time: 04/25/23 10:14 AM  Result Value Ref Range   WBC 9.0 4.0 - 10.5 K/uL   RBC 3.83 (L) 3.87 - 5.11 MIL/uL   Hemoglobin  12.6 12.0 - 15.0 g/dL   HCT 30.1 60.1 - 09.3 %   MCV 95.8 80.0 - 100.0 fL   MCH 32.9 26.0 - 34.0 pg   MCHC 34.3 30.0 - 36.0 g/dL   RDW 23.5 (H) 57.3 - 22.0 %   Platelets 218 150 - 400 K/uL  nRBC 0.0 0.0 - 0.2 %   Neutrophils Relative % 66 %   Neutro Abs 6.0 1.7 - 7.7 K/uL   Lymphocytes Relative 24 %   Lymphs Abs 2.2 0.7 - 4.0 K/uL   Monocytes Relative 5 %   Monocytes Absolute 0.4 0.1 - 1.0 K/uL   Eosinophils Relative 4 %   Eosinophils Absolute 0.3 0.0 - 0.5 K/uL   Basophils Relative 0 %   Basophils Absolute 0.0 0.0 - 0.1 K/uL   Immature Granulocytes 1 %   Abs Immature Granulocytes 0.06 0.00 - 0.07 K/uL    Comment: Performed at [facility], [address], [place], [place] [address]  Basic metabolic panel     Status: Abnormal   Collection Time: 04/25/23 10:14 AM  Result Value Ref Range   Sodium 136 135 - 145 mmol/L   Potassium 3.3 (L) 3.5 - 5.1 mmol/L   Chloride 107 98 - 111 mmol/L   CO2 19 (L) 22 - 32 mmol/L   Glucose, Bld 97 70 - 99 mg/dL    Comment: Glucose reference range applies only to samples taken after fasting for at least 8 hours.   BUN 7 (L) 8 - 23 mg/dL   Creatinine, Ser 6.04 0.44 - 1.00 mg/dL   Calcium 8.2 (L) 8.9 - 10.3 mg/dL   GFR, Estimated >54 >09 mL/min    Comment: (NOTE) Calculated using the CKD-EPI Creatinine Equation (2021)    Anion gap 10 5 - 15    Comment: Performed at [facility], [address] [place], [place] [address]      Assessment & Plan:  Augmentin Flonase Medrol dose pack Mucinex Push fluids  Problem List Items Addressed This Visit       Respiratory   Acute URI - Primary    Return if symptoms worsen or fail to improve.   Total time spent: 25 minutes  [employer], NP  06/19/2023   This document may have been prepared by Dragon Voice Recognition software and as such may include unintentional dictation errors.

## 2023-06-22 ENCOUNTER — Ambulatory Visit: Payer: Self-pay | Admitting: Surgery

## 2023-06-22 DIAGNOSIS — Z01818 Encounter for other preprocedural examination: Secondary | ICD-10-CM

## 2023-06-22 NOTE — Progress Notes (Signed)
 Surgery orders requested via Epic inbox.

## 2023-06-26 ENCOUNTER — Other Ambulatory Visit: Payer: Self-pay | Admitting: Cardiology

## 2023-06-27 NOTE — Progress Notes (Signed)
 COVID Vaccine received:  []  No []  Yes Date of any COVID positive Test in last 90 days:  PCP - Alica Antu, NP at [facility] [phone] (Work) [phone] (Fax)  Cardiologist - Debborah Fairly, MD  Oncology- Timmy Forbes, MD   Chest x-ray - 12-29-2022 1v  Epic EKG -  12-29-2022  Epic Stress Test - Persantine Stress test 04-13-2023  Epic ECHO - 04-12-2023  Epic Cardiac Cath -   Bowel Prep - [x]  No  []   Yes ______  Pacemaker / ICD device []  No []  Yes   Spinal Cord Stimulator:[]  No []  Yes       History of Sleep Apnea? []  No []  Yes   CPAP used?- []  No []  Yes    Does the patient monitor blood sugar?   []  N/A   []  No []  Yes  Patient has: []  NO Hx DM   [x]  Pre-DM   []  DM1  []   DM2 Last A1c was: 5.5  on  06-17-2022     Blood Thinner / Instructions: Aspirin Instructions:  ERAS Protocol Ordered: []  No  []  Yes PRE-SURGERY []  ENSURE  []  G2   []  No Drink Ordered  Patient is to be NPO after:   no orders  Dental hx: []  Dentures:  []  N/A      []  Bridge or Partial:                   []  Loose or Damaged teeth:   Comments:   Activity level: Patient is able / unable to climb a flight of stairs without difficulty; []  No CP  []  No SOB, but would have ___   Patient can / can not perform ADLs without assistance.   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  Fatty liver, ?LFTs, pancytopenia, GAD  Patient denies shortness of breath, fever, cough and chest pain at PAT appointment.  Patient verbalized understanding and agreement to the Pre-Surgical Instructions that were given to them at this PAT appointment. Patient was also educated of the need to review these PAT instructions again prior to her surgery.I reviewed the appropriate phone numbers to call if they have any and questions or concerns.

## 2023-06-27 NOTE — Patient Instructions (Addendum)
 SURGICAL WAITING ROOM VISITATION Patients having surgery or a procedure may have no more than 2 support people in the waiting area - these visitors may rotate in the visitor waiting room.   If the patient needs to stay at the hospital during part of their recovery, the visitor guidelines for inpatient rooms apply.  PRE-OP VISITATION  Pre-op nurse will coordinate an appropriate time for 1 support person to accompany the patient in pre-op.  This support person may not rotate.  This visitor will be contacted when the time is appropriate for the visitor to come back in the pre-op area.  Please refer to the [facility] website for the visitor guidelines for Inpatients (after your surgery is over and you are in a regular room).  You are not required to quarantine at this time prior to your surgery. However, you must do this: Hand Hygiene often Do NOT share personal items Notify your provider if you are in close contact with someone who has COVID or you develop fever 100.4 or greater, new onset of sneezing, cough, sore throat, shortness of breath or body aches.  If you test positive for Covid or have been in contact with anyone that has tested positive in the last 10 days please notify you surgeon.    Your procedure is scheduled on:  FRIDAY  July 07, 2023  Report to [facility] Main Entrance: [facility] entrance where the [employer] is available.   Report to admitting at:  05:15   AM  Call this number if you have any questions or problems the morning of surgery [phone]  FOLLOW ANY ADDITIONAL PRE OP INSTRUCTIONS YOU RECEIVED FROM YOUR SURGEON'S OFFICE!!!  DRINK two (2) bottles of Pre-Surgery G2 drink starting at 6:00 pm the evening prior to your surgery to help prevent dehydration. Increase drinking clear fluids (see list below)          Do not eat food after Midnight the night prior to your surgery/procedure.  After Midnight you may have the following liquids until   04:30 AM  DAY OF SURGERY  Clear Liquid Diet Water Black Coffee (sugar ok, NO MILK/CREAM OR CREAMERS)  Tea (sugar ok, NO MILK/CREAM OR CREAMERS) regular and decaf                             Plain Jell-O  with no fruit (NO RED)                                           Fruit ices (not with fruit pulp, NO RED)                                     Popsicles (NO RED)                                                                  Juice: NO CITRUS JUICES: only apple, WHITE grape, WHITE cranberry Sports drinks like Gatorade or Powerade (NO RED)  The day of surgery:  Drink ONE (1) Pre-Surgery G2 at   04:30 AM the morning of surgery. Drink in one sitting. Do not sip.  This drink was given to you during your hospital pre-op appointment visit. Nothing else to drink after completing the Pre-Surgery G2 : No candy, chewing gum or throat lozenges.     Oral Hygiene is also important to reduce your risk of infection.        Remember - BRUSH YOUR TEETH THE MORNING OF SURGERY WITH YOUR REGULAR TOOTHPASTE  Do NOT smoke after Midnight the night before surgery.  STOP TAKING all Vitamins, Herbs and supplements 1 week before your surgery.   Take ONLY these medicines the morning of surgery with A SIP OF WATER: Omeprazole. You may use your Flonase nasal spray and your inhalers if needed.                    You may not have any metal on your body including hair pins, jewelry, and body piercing  Do not wear make-up, lotions, powders, perfumes  or deodorant  Do not wear nail polish including gel and S&S, artificial / acrylic nails, or any other type of covering on natural nails including finger and toenails. If you have artificial nails, gel coating, etc., that needs to be removed by a nail salon, Please have this removed prior to surgery. Not doing so may mean that your surgery could be cancelled or delayed if the Surgeon or anesthesia staff feels like they are unable to monitor you safely.   Do not  shave 48 hours prior to surgery to avoid nicks in your skin which may contribute to postoperative infections.   Contacts, Hearing Aids, dentures or bridgework may not be worn into surgery. DENTURES WILL BE REMOVED PRIOR TO SURGERY PLEASE DO NOT APPLY "Poly grip" OR ADHESIVES!!!  You may bring a small overnight bag with you on the day of surgery, only pack items that are not valuable. [facility] IS NOT RESPONSIBLE   FOR VALUABLES THAT ARE LOST OR STOLEN.   Do not bring your home medications to the hospital. The Pharmacy will dispense medications listed on your medication list to you during your admission in the Hospital.  Please read over the following fact sheets you were given: IF YOU HAVE QUESTIONS ABOUT YOUR PRE-OP INSTRUCTIONS, PLEASE CALL [phone]   [facility] - Preparing for Surgery Before surgery, you can play an important role.  Because skin is not sterile, your skin needs to be as free of germs as possible.  You can reduce the number of germs on your skin by washing with CHG (chlorahexidine gluconate) soap before surgery.  CHG is an antiseptic cleaner which kills germs and bonds with the skin to continue killing germs even after washing. Please DO NOT use if you have an allergy to CHG or antibacterial soaps.  If your skin becomes reddened/irritated stop using the CHG and inform your nurse when you arrive at Short Stay. Do not shave (including legs and underarms) for at least 48 hours prior to the first CHG shower.  You may shave your face/neck.  Please follow these instructions carefully:  1.  Shower with CHG Soap the night before surgery and the  morning of surgery.  2.  If you choose to wash your hair, wash your hair first as usual with your normal  shampoo.  3.  After you shampoo, rinse your hair and body thoroughly to remove the shampoo.  4.  Use CHG as you would any other liquid soap.  You can apply chg directly to the skin and wash.  Gently with a  scrungie or clean washcloth.  5.  Apply the CHG Soap to your body ONLY FROM THE NECK DOWN.   Do not use on face/ open                           Wound or open sores. Avoid contact with eyes, ears mouth and genitals (private parts).                       Wash face,  Genitals (private parts) with your normal soap.             6.  Wash thoroughly, paying special attention to the area where your  surgery  will be performed.  7.  Thoroughly rinse your body with warm water from the neck down.  8.  DO NOT shower/wash with your normal soap after using and rinsing off the CHG Soap.            9.  Pat yourself dry with a clean towel.            10.  Wear clean pajamas.            11.  Place clean sheets on your bed the night of your first shower and do not  sleep with pets.  ON THE DAY OF SURGERY : Do not apply any lotions/deodorants the morning of surgery.  Please wear clean clothes to the hospital/surgery center.     FAILURE TO FOLLOW THESE INSTRUCTIONS MAY RESULT IN THE CANCELLATION OF YOUR SURGERY  PATIENT SIGNATURE_________________________________  NURSE SIGNATURE__________________________________  ________________________________________________________________________         Yvonne Scott    An incentive spirometer is a tool that can help keep your lungs clear and active. This tool measures how well you are filling your lungs with each breath. Taking long deep breaths may help reverse or decrease the chance of developing breathing (pulmonary) problems (especially infection) following: A long period of time when you are unable to move or be active. BEFORE THE PROCEDURE  If the spirometer includes an indicator to show your best effort, your nurse or respiratory therapist will set it to a desired goal. If possible, sit up straight or lean slightly forward. Try not to slouch. Hold the incentive spirometer in an upright position. INSTRUCTIONS FOR USE  Sit on the edge of your  bed if possible, or sit up as far as you can in bed or on a chair. Hold the incentive spirometer in an upright position. Breathe out normally. Place the mouthpiece in your mouth and seal your lips tightly around it. Breathe in slowly and as deeply as possible, raising the piston or the ball toward the top of the column. Hold your breath for 3-5 seconds or for as long as possible. Allow the piston or ball to fall to the bottom of the column. Remove the mouthpiece from your mouth and breathe out normally. Rest for a few seconds and repeat Steps 1 through 7 at least 10 times every 1-2 hours when you are awake. Take your time and take a few normal breaths between deep breaths. The spirometer may include an indicator to show your best effort. Use the indicator as a goal to work toward during each repetition. After each set of 10 deep breaths, practice  coughing to be sure your lungs are clear. If you have an incision (the cut made at the time of surgery), support your incision when coughing by placing a pillow or rolled up towels firmly against it. Once you are able to get out of bed, walk around indoors and cough well. You may stop using the incentive spirometer when instructed by your caregiver.  RISKS AND COMPLICATIONS Take your time so you do not get dizzy or light-headed. If you are in pain, you may need to take or ask for pain medication before doing incentive spirometry. It is harder to take a deep breath if you are having pain. AFTER USE Rest and breathe slowly and easily. It can be helpful to keep track of a log of your progress. Your caregiver can provide you with a simple table to help with this. If you are using the spirometer at home, follow these instructions: SEEK MEDICAL CARE IF:  You are having difficultly using the spirometer. You have trouble using the spirometer as often as instructed. Your pain medication is not giving enough relief while using the spirometer. You develop fever  of 100.5 F (38.1 C) or higher.                                                                                                    SEEK IMMEDIATE MEDICAL CARE IF:  You cough up bloody sputum that had not been present before. You develop fever of 102 F (38.9 C) or greater. You develop worsening pain at or near the incision site. MAKE SURE YOU:  Understand these instructions. Will watch your condition. Will get help right away if you are not doing well or get worse. Document Released: 07/11/2006 Document Revised: 05/23/2011 Document Reviewed: 09/11/2006 [facility] Patient Information 2014 [place], [place].

## 2023-06-28 ENCOUNTER — Encounter (HOSPITAL_COMMUNITY)
Admission: RE | Admit: 2023-06-28 | Discharge: 2023-06-28 | Disposition: A | Discharge status: Home / Self Care | Source: Ambulatory Visit | Attending: Surgery | Admitting: Surgery

## 2023-06-28 ENCOUNTER — Encounter (HOSPITAL_COMMUNITY): Payer: Self-pay

## 2023-06-28 ENCOUNTER — Other Ambulatory Visit: Payer: Self-pay

## 2023-06-28 ENCOUNTER — Encounter

## 2023-06-28 ENCOUNTER — Other Ambulatory Visit

## 2023-06-28 VITALS — BP 120/79 | HR 93 | Temp 97.9°F | Resp 18 | Ht 62.0 in | Wt 113.2 lb

## 2023-06-28 DIAGNOSIS — Z01818 Encounter for other preprocedural examination: Secondary | ICD-10-CM | POA: Diagnosis present

## 2023-06-28 DIAGNOSIS — R7989 Other specified abnormal findings of blood chemistry: Secondary | ICD-10-CM | POA: Diagnosis not present

## 2023-06-28 HISTORY — DX: Chronic obstructive pulmonary disease, unspecified: J44.9

## 2023-06-28 LAB — CBC WITH DIFFERENTIAL/PLATELET
Abs Immature Granulocytes: 0.21 10*3/uL — ABNORMAL HIGH (ref 0.00–0.07)
Basophils Absolute: 0 10*3/uL (ref 0.0–0.1)
Basophils Relative: 0 %
Eosinophils Absolute: 0 10*3/uL (ref 0.0–0.5)
Eosinophils Relative: 0 %
HCT: 34.1 % — ABNORMAL LOW (ref 36.0–46.0)
Hemoglobin: 11.6 g/dL — ABNORMAL LOW (ref 12.0–15.0)
Immature Granulocytes: 2 %
Lymphocytes Relative: 11 %
Lymphs Abs: 1.3 10*3/uL (ref 0.7–4.0)
MCH: 30.4 pg (ref 26.0–34.0)
MCHC: 34 g/dL (ref 30.0–36.0)
MCV: 89.5 fL (ref 80.0–100.0)
Monocytes Absolute: 0.3 10*3/uL (ref 0.1–1.0)
Monocytes Relative: 3 %
Neutro Abs: 9.5 10*3/uL — ABNORMAL HIGH (ref 1.7–7.7)
Neutrophils Relative %: 84 %
Platelets: 335 10*3/uL (ref 150–400)
RBC: 3.81 MIL/uL — ABNORMAL LOW (ref 3.87–5.11)
RDW: 15.3 % (ref 11.5–15.5)
WBC: 11.3 10*3/uL — ABNORMAL HIGH (ref 4.0–10.5)
nRBC: 0 % (ref 0.0–0.2)

## 2023-06-28 LAB — COMPREHENSIVE METABOLIC PANEL WITH GFR
ALT: 26 U/L (ref 0–44)
AST: 22 U/L (ref 15–41)
Albumin: 3.9 g/dL (ref 3.5–5.0)
Alkaline Phosphatase: 81 U/L (ref 38–126)
Anion gap: 13 (ref 5–15)
BUN: 18 mg/dL (ref 8–23)
CO2: 17 mmol/L — ABNORMAL LOW (ref 22–32)
Calcium: 8.9 mg/dL (ref 8.9–10.3)
Chloride: 103 mmol/L (ref 98–111)
Creatinine, Ser: 1.12 mg/dL — ABNORMAL HIGH (ref 0.44–1.00)
GFR, Estimated: 55 mL/min — ABNORMAL LOW (ref >60)
Glucose, Bld: 137 mg/dL — ABNORMAL HIGH (ref 70–99)
Potassium: 3 mmol/L — ABNORMAL LOW (ref 3.5–5.1)
Sodium: 133 mmol/L — ABNORMAL LOW (ref 135–145)
Total Bilirubin: 0.5 mg/dL (ref 0.0–1.2)
Total Protein: 7 g/dL (ref 6.5–8.1)

## 2023-06-28 NOTE — Progress Notes (Addendum)
 Pt. Denies Pre-DM at preop A1c 5.5  on 06-17-22  Activity-Able to complete Adl's without CP or SOB  K+ 3.0 Routed to surgeon

## 2023-07-01 ENCOUNTER — Inpatient Hospital Stay
Admission: EM | Admit: 2023-07-01 | Discharge: 2023-07-05 | DRG: 683 | Disposition: A | Discharge status: Home / Self Care | Attending: Internal Medicine | Admitting: Internal Medicine

## 2023-07-01 ENCOUNTER — Other Ambulatory Visit: Payer: Self-pay

## 2023-07-01 ENCOUNTER — Emergency Department

## 2023-07-01 DIAGNOSIS — Z932 Ileostomy status: Secondary | ICD-10-CM

## 2023-07-01 DIAGNOSIS — D638 Anemia in other chronic diseases classified elsewhere: Secondary | ICD-10-CM | POA: Diagnosis present

## 2023-07-01 DIAGNOSIS — F17210 Nicotine dependence, cigarettes, uncomplicated: Secondary | ICD-10-CM | POA: Diagnosis present

## 2023-07-01 DIAGNOSIS — N179 Acute kidney failure, unspecified: Secondary | ICD-10-CM | POA: Diagnosis not present

## 2023-07-01 DIAGNOSIS — E860 Dehydration: Principal | ICD-10-CM | POA: Diagnosis present

## 2023-07-01 DIAGNOSIS — J449 Chronic obstructive pulmonary disease, unspecified: Secondary | ICD-10-CM | POA: Diagnosis present

## 2023-07-01 DIAGNOSIS — Z853 Personal history of malignant neoplasm of breast: Secondary | ICD-10-CM

## 2023-07-01 DIAGNOSIS — C7801 Secondary malignant neoplasm of right lung: Secondary | ICD-10-CM | POA: Diagnosis present

## 2023-07-01 DIAGNOSIS — I959 Hypotension, unspecified: Secondary | ICD-10-CM | POA: Insufficient documentation

## 2023-07-01 DIAGNOSIS — K219 Gastro-esophageal reflux disease without esophagitis: Secondary | ICD-10-CM | POA: Diagnosis present

## 2023-07-01 DIAGNOSIS — Z85048 Personal history of other malignant neoplasm of rectum, rectosigmoid junction, and anus: Secondary | ICD-10-CM

## 2023-07-01 DIAGNOSIS — W19XXXA Unspecified fall, initial encounter: Secondary | ICD-10-CM | POA: Diagnosis present

## 2023-07-01 DIAGNOSIS — S40811A Abrasion of right upper arm, initial encounter: Secondary | ICD-10-CM | POA: Diagnosis present

## 2023-07-01 DIAGNOSIS — Z79899 Other long term (current) drug therapy: Secondary | ICD-10-CM

## 2023-07-01 DIAGNOSIS — E876 Hypokalemia: Secondary | ICD-10-CM | POA: Diagnosis present

## 2023-07-01 DIAGNOSIS — Z808 Family history of malignant neoplasm of other organs or systems: Secondary | ICD-10-CM

## 2023-07-01 DIAGNOSIS — I10 Essential (primary) hypertension: Secondary | ICD-10-CM | POA: Diagnosis present

## 2023-07-01 DIAGNOSIS — Z9221 Personal history of antineoplastic chemotherapy: Secondary | ICD-10-CM

## 2023-07-01 DIAGNOSIS — E785 Hyperlipidemia, unspecified: Secondary | ICD-10-CM | POA: Diagnosis present

## 2023-07-01 DIAGNOSIS — E871 Hypo-osmolality and hyponatremia: Secondary | ICD-10-CM | POA: Diagnosis present

## 2023-07-01 DIAGNOSIS — I700 Atherosclerosis of aorta: Secondary | ICD-10-CM | POA: Diagnosis present

## 2023-07-01 DIAGNOSIS — Z803 Family history of malignant neoplasm of breast: Secondary | ICD-10-CM

## 2023-07-01 DIAGNOSIS — E872 Acidosis, unspecified: Secondary | ICD-10-CM | POA: Diagnosis present

## 2023-07-01 DIAGNOSIS — C787 Secondary malignant neoplasm of liver and intrahepatic bile duct: Secondary | ICD-10-CM | POA: Diagnosis present

## 2023-07-01 DIAGNOSIS — Z7951 Long term (current) use of inhaled steroids: Secondary | ICD-10-CM

## 2023-07-01 DIAGNOSIS — S80212A Abrasion, left knee, initial encounter: Secondary | ICD-10-CM | POA: Diagnosis present

## 2023-07-01 DIAGNOSIS — Z9011 Acquired absence of right breast and nipple: Secondary | ICD-10-CM

## 2023-07-01 DIAGNOSIS — Z8701 Personal history of pneumonia (recurrent): Secondary | ICD-10-CM

## 2023-07-01 DIAGNOSIS — Z01818 Encounter for other preprocedural examination: Secondary | ICD-10-CM

## 2023-07-01 LAB — CBC
HCT: 40.6 % (ref 36.0–46.0)
HCT: 30.5 % — ABNORMAL LOW (ref 36.0–46.0)
Hemoglobin: 14.1 g/dL (ref 12.0–15.0)
Hemoglobin: 11.1 g/dL — ABNORMAL LOW (ref 12.0–15.0)
MCH: 30.7 pg (ref 26.0–34.0)
MCH: 32.1 pg (ref 26.0–34.0)
MCHC: 34.7 g/dL (ref 30.0–36.0)
MCHC: 36.4 g/dL — ABNORMAL HIGH (ref 30.0–36.0)
MCV: 88.5 fL (ref 80.0–100.0)
MCV: 88.2 fL (ref 80.0–100.0)
Platelets: 340 10*3/uL (ref 150–400)
Platelets: 237 10*3/uL (ref 150–400)
RBC: 4.59 MIL/uL (ref 3.87–5.11)
RBC: 3.46 MIL/uL — ABNORMAL LOW (ref 3.87–5.11)
RDW: 14.8 % (ref 11.5–15.5)
RDW: 14.6 % (ref 11.5–15.5)
WBC: 12.7 10*3/uL — ABNORMAL HIGH (ref 4.0–10.5)
WBC: 11.3 10*3/uL — ABNORMAL HIGH (ref 4.0–10.5)
nRBC: 0 % (ref 0.0–0.2)
nRBC: 0 % (ref 0.0–0.2)

## 2023-07-01 LAB — BASIC METABOLIC PANEL WITH GFR
Anion gap: 14 (ref 5–15)
Anion gap: 18 — ABNORMAL HIGH (ref 5–15)
BUN: 25 mg/dL — ABNORMAL HIGH (ref 8–23)
BUN: 22 mg/dL (ref 8–23)
CO2: 17 mmol/L — ABNORMAL LOW (ref 22–32)
CO2: 14 mmol/L — ABNORMAL LOW (ref 22–32)
Calcium: 8.5 mg/dL — ABNORMAL LOW (ref 8.9–10.3)
Calcium: 7.7 mg/dL — ABNORMAL LOW (ref 8.9–10.3)
Chloride: 95 mmol/L — ABNORMAL LOW (ref 98–111)
Chloride: 98 mmol/L (ref 98–111)
Creatinine, Ser: 2.68 mg/dL — ABNORMAL HIGH (ref 0.44–1.00)
Creatinine, Ser: 1.78 mg/dL — ABNORMAL HIGH (ref 0.44–1.00)
GFR, Estimated: 19 mL/min — ABNORMAL LOW (ref >60)
GFR, Estimated: 32 mL/min — ABNORMAL LOW (ref >60)
Glucose, Bld: 105 mg/dL — ABNORMAL HIGH (ref 70–99)
Glucose, Bld: 84 mg/dL (ref 70–99)
Potassium: 3.5 mmol/L (ref 3.5–5.1)
Potassium: 2.9 mmol/L — ABNORMAL LOW (ref 3.5–5.1)
Sodium: 126 mmol/L — ABNORMAL LOW (ref 135–145)
Sodium: 130 mmol/L — ABNORMAL LOW (ref 135–145)

## 2023-07-01 MED ORDER — ENOXAPARIN SODIUM 30 MG/0.3ML IJ SOSY
30.0000 mg | PREFILLED_SYRINGE | INTRAMUSCULAR | Status: DC
  Administered 2023-07-01 – 2023-07-02 (×2): 30 mg via SUBCUTANEOUS
  Filled 2023-07-01 (×2): qty 0.3

## 2023-07-01 MED ORDER — POLYETHYLENE GLYCOL 3350 17 G PO PACK: 17.0000 g | PACK | Freq: Every day | ORAL | Status: DC | PRN

## 2023-07-01 MED ORDER — LACTATED RINGERS IV BOLUS
2000.0000 mL | Freq: Once | INTRAVENOUS | Status: AC
  Administered 2023-07-01: 2000 mL via INTRAVENOUS

## 2023-07-01 MED ORDER — BISACODYL 5 MG PO TBEC: 5.0000 mg | DELAYED_RELEASE_TABLET | Freq: Every day | ORAL | Status: DC | PRN

## 2023-07-01 MED ORDER — SODIUM CHLORIDE 0.9 % IV SOLN
2.0000 g | INTRAVENOUS | Status: DC
  Filled 2023-07-01: qty 2

## 2023-07-01 MED ORDER — ALVIMOPAN 12 MG PO CAPS
12.0000 mg | ORAL_CAPSULE | ORAL | Status: AC
  Filled 2023-07-01: qty 1

## 2023-07-01 MED ORDER — LACTATED RINGERS IV BOLUS
1000.0000 mL | Freq: Once | INTRAVENOUS | Status: AC
  Administered 2023-07-01: 1000 mL via INTRAVENOUS

## 2023-07-01 MED ORDER — CHLORHEXIDINE GLUCONATE 0.12 % MT SOLN
15.0000 mL | Freq: Once | OROMUCOSAL | Status: AC
  Administered 2023-07-01: 15 mL via OROMUCOSAL
  Filled 2023-07-01: qty 15

## 2023-07-01 MED ORDER — LACTATED RINGERS IV BOLUS: 1000.0000 mL | Freq: Once | INTRAVENOUS | Status: DC

## 2023-07-01 MED ORDER — ACETAMINOPHEN 325 MG PO TABS
650.0000 mg | ORAL_TABLET | Freq: Four times a day (QID) | ORAL | Status: DC | PRN
  Administered 2023-07-01 – 2023-07-05 (×6): 650 mg via ORAL
  Filled 2023-07-01 (×6): qty 2

## 2023-07-01 MED ORDER — HEPARIN SODIUM (PORCINE) 5000 UNIT/ML IJ SOLN: 5000.0000 [IU] | Freq: Once | INTRAMUSCULAR | Status: DC

## 2023-07-01 MED ORDER — ACETAMINOPHEN 650 MG RE SUPP
650.0000 mg | Freq: Four times a day (QID) | RECTAL | Status: DC | PRN
  Filled 2023-07-01: qty 1

## 2023-07-01 MED ORDER — ENSURE PRE-SURGERY PO LIQD
592.0000 mL | Freq: Once | ORAL | Status: DC
  Filled 2023-07-01: qty 592

## 2023-07-01 MED ORDER — ENSURE PRE-SURGERY PO LIQD
296.0000 mL | Freq: Once | ORAL | Status: DC
  Filled 2023-07-01: qty 296

## 2023-07-01 MED ORDER — CHLORHEXIDINE GLUCONATE CLOTH 2 % EX PADS: 6.0000 | MEDICATED_PAD | Freq: Once | CUTANEOUS | Status: DC

## 2023-07-01 MED ORDER — ACETAMINOPHEN 500 MG PO TABS
1000.0000 mg | ORAL_TABLET | ORAL | Status: AC
  Filled 2023-07-01: qty 2

## 2023-07-01 MED ORDER — BUPIVACAINE LIPOSOME 1.3 % IJ SUSP
20.0000 mL | Freq: Once | INTRAMUSCULAR | Status: DC
  Filled 2023-07-01: qty 20

## 2023-07-01 MED ORDER — LACTATED RINGERS IV SOLN: INTRAVENOUS | Status: DC

## 2023-07-01 MED ORDER — ORAL CARE MOUTH RINSE: 15.0000 mL | Freq: Once | OROMUCOSAL | Status: AC

## 2023-07-01 MED ORDER — IBUPROFEN 600 MG PO TABS
600.0000 mg | ORAL_TABLET | Freq: Once | ORAL | Status: AC
  Administered 2023-07-01: 600 mg via ORAL
  Filled 2023-07-01: qty 1

## 2023-07-01 MED ORDER — SODIUM CHLORIDE 0.9 % IV SOLN: INTRAVENOUS | Status: DC

## 2023-07-01 NOTE — ED Provider Notes (Signed)
 [facility] Provider Note   Event Date/Time   First MD Initiated Contact with Patient 07/01/23 1458     (approximate) History  Dizziness  HPI Yvonne Scott is a 63 y.o. female with a past medical history of rectal cancer with ileostomy in place who presents via EMS complaining of lightheadedness and presyncopal symptoms that been worsening over the last 2 days.  Patient states that she feels that her ostomy has been filling up with liquid very quickly after eating p.o. ingestion.  Patient states that she has been so weak that she had a fall last night and again this morning due to her lightheadedness.  Patient denies any head trauma.  Patient does endorse 2 skin tears to her right arm ROS: Patient currently denies any vision changes, tinnitus, difficulty speaking, facial droop, sore throat, chest pain, shortness of breath, abdominal pain, nausea/vomiting/diarrhea, dysuria, or weakness/numbness/paresthesias in any extremity   Physical Exam  Triage Vital Signs: ED Triage Vitals  Encounter Vitals Group     BP 07/01/23 1436 (!) 83/65     Systolic BP Percentile --      Diastolic BP Percentile --      Pulse Rate 07/01/23 1436 (!) 118     Resp 07/01/23 1436 20     Temp 07/01/23 1433 98 F (36.7 C)     Temp Source 07/01/23 1433 Oral     SpO2 07/01/23 1436 95 %     Weight 07/01/23 1436 113 lb (51.3 kg)     Height 07/01/23 1436 5\' 2"  (1.575 m)     Head Circumference --      Peak Flow --      Pain Score 07/01/23 1434 6     Pain Loc --      Pain Education --      Exclude from Growth Chart --    Most recent vital signs: Vitals:   07/01/23 2004 07/01/23 2041  BP: (!) 92/57 (!) 84/50  Pulse: 86 82  Resp: 16 19  Temp: 98.3 F (36.8 C) 98.6 F (37 C)  SpO2: 100% 99%   General: Awake, oriented x4. CV:  Good peripheral perfusion.  Resp:  Normal effort.  Abd:  No distention.  Ileostomy in place with liquid stool that is light brown without evidence of  blood Other:  Middle-aged well-developed, well-nourished female resting comfortably in no acute distress.  2 skin tears to the right upper and forearm ED Results / Procedures / Treatments  Labs (all labs ordered are listed, but only abnormal results are displayed) Labs Reviewed  BASIC METABOLIC PANEL WITH GFR - Abnormal; Notable for the following components:      Result Value   Sodium 126 (*)    Chloride 95 (*)    CO2 17 (*)    Glucose, Bld 105 (*)    BUN 25 (*)    Creatinine, Ser 2.68 (*)    Calcium  8.5 (*)    GFR, Estimated 19 (*)    All other components within normal limits  CBC - Abnormal; Notable for the following components:   WBC 12.7 (*)    All other components within normal limits  CBC - Abnormal; Notable for the following components:   WBC 11.3 (*)    RBC 3.46 (*)    Hemoglobin 11.1 (*)    HCT 30.5 (*)    MCHC 36.4 (*)    All other components within normal limits  BASIC METABOLIC PANEL WITH GFR - Abnormal; Notable  for the following components:   Sodium 130 (*)    Potassium 2.9 (*)    CO2 14 (*)    Creatinine, Ser 1.78 (*)    Calcium  7.7 (*)    GFR, Estimated 32 (*)    Anion gap 18 (*)    All other components within normal limits  GASTROINTESTINAL PANEL BY PCR, STOOL (REPLACES STOOL CULTURE)  URINALYSIS, ROUTINE W REFLEX MICROSCOPIC  CREATININE, SERUM   EKG ED ECG REPORT I, Charleen Conn, the attending physician, personally viewed and interpreted this ECG. Date: 07/01/2023 EKG Time: 1441 Rate: 117 Rhythm: Tachycardic sinus rhythm QRS Axis: normal Intervals: normal ST/T Wave abnormalities: normal Narrative Interpretation: Tachycardic sinus rhythm.  No evidence of acute ischemia PROCEDURES: Critical Care performed: No Procedures MEDICATIONS ORDERED IN ED: Medications  acetaminophen  (TYLENOL ) tablet 650 mg (650 mg Oral Given 07/01/23 2301)    Or  acetaminophen  (TYLENOL ) suppository 650 mg ( Rectal See Alternative 07/01/23 2301)  bisacodyl  (DULCOLAX) EC  tablet 5 mg (has no administration in time range)  enoxaparin  (LOVENOX ) injection 30 mg (30 mg Subcutaneous Given 07/01/23 2135)  polyethylene glycol (MIRALAX  / GLYCOLAX ) packet 17 g (has no administration in time range)  lactated ringers  infusion ( Intravenous New Bag/Given 07/01/23 2125)  feeding supplement (ENSURE PRE-SURGERY) liquid 592 mL (592 mLs Oral Not Given 07/01/23 2138)  feeding supplement (ENSURE PRE-SURGERY) liquid 296 mL (has no administration in time range)  heparin  injection 5,000 Units (5,000 Units Subcutaneous Not Given 07/01/23 2136)  Chlorhexidine  Gluconate Cloth 2 % PADS 6 each (6 each Topical Not Given 07/01/23 2137)    And  Chlorhexidine  Gluconate Cloth 2 % PADS 6 each (6 each Topical Not Given 07/01/23 2138)  cefoTEtan  (CEFOTAN ) 2 g in sodium chloride  0.9 % 100 mL IVPB (has no administration in time range)  acetaminophen  (TYLENOL ) tablet 1,000 mg (has no administration in time range)  bupivacaine  liposome (EXPAREL ) 1.3 % injection 266 mg (266 mg Infiltration Not Given 07/01/23 2137)  alvimopan  (ENTEREG ) capsule 12 mg (has no administration in time range)  0.9 %  sodium chloride  infusion ( Intravenous New Bag/Given 07/01/23 2306)  lactated ringers  bolus 2,000 mL (0 mLs Intravenous Stopped 07/01/23 1941)  ibuprofen  (ADVIL ) tablet 600 mg (600 mg Oral Given 07/01/23 1953)  lactated ringers  bolus 1,000 mL (1,000 mLs Intravenous New Bag/Given 07/01/23 1957)  chlorhexidine  (PERIDEX ) 0.12 % solution 15 mL (15 mLs Mouth/Throat Given 07/01/23 2135)    Or  Oral care mouth rinse ( Mouth Rinse See Alternative 07/01/23 2135)   IMPRESSION / MDM / ASSESSMENT AND PLAN / ED COURSE  I reviewed the triage vital signs and the nursing notes.                             The patient is on the cardiac monitor to evaluate for evidence of arrhythmia and/or significant heart rate changes. Patient's presentation is most consistent with acute presentation with potential threat to life or bodily  function. This patient presents with generalized weakness and fatigue likely secondary to dehydration. Suspect acute kidney injury of prerenal origin. Doubt intrinsic renal dysfunction or obstructive nephropathy. Considered alternate etiologies of the patient's symptoms including infectious processes, severe metabolic derangements or electrolyte abnormalities, ischemia/ACS, heart failure, and intracranial/central processes but think these are unlikely given the history and physical exam.  Plan: labs, 3 L LR fluid resuscitation, pain/nausea control, reassessment  Dispo: Admit to medicine   FINAL CLINICAL IMPRESSION(S) / ED DIAGNOSES  Final diagnoses:  Dehydration  AKI (acute kidney injury) (HCC)   Rx / DC Orders   ED Discharge Orders     None      Note:  This document was prepared using Dragon voice recognition software and may include unintentional dictation errors.   Bryen Hinderman K, MD 07/02/23 [phone]

## 2023-07-01 NOTE — ED Triage Notes (Signed)
 First nurse note: Arrived by EMS from home with c/o colostomy bag issues. Reports her colostomy bag keeps filling up with liquid and she is having trouble urinating per EMS  A&O x4 upon arrival  History breast, lung, colon and rectal.   EMS vitals: 105/75 b/p 86HR 98% RA

## 2023-07-01 NOTE — H&P (Signed)
 History and Physical    Yvonne Scott XBJ:[id number] DOB: [birth year] DOA: 07/01/2023 PCP: Alica Antu, NP  Chief Complaint: weakness, falls Historian: patient  HPI:  Yvonne Scott is a 63 y.o. female with a PMH significant for colostomy s/p colon resection for rectal cancer, tobacco use, invasive ductal carcinoma of right breast, neuropathy, anxiety, squamous cell lung cancer on right, rectal adenocarcinoma, GERD, HTN, fatty liver, HLD. At baseline, they live independently and self-sufficient with ADLs.  They presented from home to the ED on 07/01/2023 with "dehydration" x several days.  Patient endorses intermittent episodes of dehydration and presentations to the ED due to frequent loose and large-volume stools in her colostomy bag since it was placed 3 months ago s/p colon resection for rectal cancer.  Today, she had not taken anything by mouth due to poor appetite and nausea.  Denies any vomiting.  Does not have any abdominal pain but does endorse constant drainage of watery, nonbloody stool in her colostomy bag today.  She denies any changes in her diet recently or any medication changes.  She is currently scheduled for reversal of her colostomy on 4/25. Additionally, patient states that she has had dizziness particularly with standing and had an episode of presyncope that caused her to fall to the ground when she went to the bathroom to empty her colostomy bag and she sustained skin tears of her right upper extremity and left lower extremity.  She was able to remember the event without head injury.  She has been ambulatory since the fall. Patient states that she is down to 4 cigarettes/day and does not want to use a nicotine  patch.  She was told to discontinue her home medications prior to her upcoming surgery other than pain control.  In the ED, it was found that they had stable vital signs with the exception of low blood pressure.  Blood pressures as low as 83/65 with a MAP of 71.   She denies presyncopal symptoms while at rest.  She denies history of low blood pressures or taking any antihypertensives. Significant findings included: Na+ 130, K+ 2.9, creatinine 1.78, GFR 32, anion gap 18.  WBC 11.3, Hgb 11.1. GI panel collected and is in process. CT abdomen pelvis: No acute abnormality.  Hypoattenuating liver lesions up to 1.9 cm are new since her PET/CT on 7/15 2024 and are concerning for metastatic disease.  Will require IV contrast imaging for further evaluation.  They were initially treated with 3 L LR, ibuprofen .  Dr. Camilo Cella, colorectal surgery placed several orders while patient was in ED that would indicate planning to take to the OR this admission but I have not received direct communication from him at this time.  Patient was admitted to medicine service for further workup and management of dehydration as outlined in detail below.  Assessment/Plan Principal Problem:   AKI (acute kidney injury) (HCC)   Diarrhea- subacute and intermittent since colostomy placed 3 months ago following resection for rectal cancer. Dehydration  AKI-creatinine is 0.6 at baseline and as high as 2.68 on admission but improved with IV fluids.  Due to high-volume watery stool output in her colostomy bag this is likely the culprit of her electrolyte abnormalities and AKI.  S/p 3L LR in the ED. Hypokalemia-K+ 2.9, mild hyponatremia- Na+ 130 - Continue maintenance IV fluids - Replete electrolytes as needed - BMP a.m. - Ostomy care per nursing orders - GI panel was collected and in process at this time - PT/OT - Avoid NSAIDs.  Takes ibuprofen  at home and was given in the ED.  Tylenol  for analgesia as needed Follow-up if colorectal surgery planning procedure while inpatient.  Patient is currently n.p.o. after midnight per the orders of Dr. Camilo Cella so potentially following for a procedure but has not directly discussed with me nor did ED provider provide this information.  Extensive cancer  history including small lung cancer, adenocarcinoma of the lung, invasive ductal carcinoma of breast-follows with Dr. Wilhelmenia Scott, oncology.  See most recent oncology note on 3/3 for further treatment details of all of these. New finding of hypoattenuating liver lesions up to 1.9 cm which are new since her last PET scan on 09/26/2022 which are concerning for metastatic disease.  Follows with Dr. Camilo Cella, colorectal surgery who is planning reversal of her ostomy bag on 4/25. - For new lesion, radiology recommends follow-up imaging with contrast as the lesion was not fully evaluated with the noncontrast abdominal CT scan done on admission.  Will need further oncology follow-up.  Would be appropriate for palliative consult as well.  Tobacco use-patient states that she has reduced her smoking to 4 cigarettes/day. - Declines nicotine  replacement  Past Medical History:  Diagnosis Date   Anxiety    Aortic atherosclerosis (HCC) 10/24/2016   Breast cancer (HCC)    colon cancer  and rectal   COPD (chronic obstructive pulmonary disease) (HCC)    Depression    Dyspnea    GERD (gastroesophageal reflux disease)    Hypokalemia    Hypomagnesemia    Invasive ductal carcinoma of right breast (HCC)    Metabolic acidosis    Personal history of chemotherapy    Pneumonia    Severe protein-calorie malnutrition (HCC)    Thrombocytopenia (HCC)     Past Surgical History:  Procedure Laterality Date   BREAST BIOPSY Right 2022   BRONCHIAL BIOPSY  12/29/2022   Procedure: BRONCHIAL BIOPSIES;  Surgeon: Vergia Glasgow, MD;  Location: [facility];  Service: Pulmonary;;   BRONCHIAL NEEDLE ASPIRATION BIOPSY  12/29/2022   Procedure: BRONCHIAL NEEDLE ASPIRATION BIOPSIES;  Surgeon: Vergia Glasgow, MD;  Location: [facility];  Service: Pulmonary;;   COLONOSCOPY WITH PROPOFOL  N/A 12/07/2022   Procedure: COLONOSCOPY WITH PROPOFOL ;  Surgeon: Selena Daily, MD;  Location: [facility] END[facility]COPY;  Service: Gastroenterology;  Laterality:  N/A;   FLEXIBLE SIGMOIDOSCOPY N/A 04/21/2023   Procedure: FLEXIBLE SIGMOIDOSCOPY, ICG PERFUSION;  Surgeon: Melvenia Stabs, MD;  Location: [facility];  Service: General;  Laterality: N/A;   FOOT SURGERY Left    little toe correction   MASTECTOMY W/ SENTINEL NODE BIOPSY Right 10/19/2021   Procedure: MASTECTOMY WITH SENTINEL LYMPH NODE BIOPSY ( modified radical);  Surgeon: Eldred Grego, MD;  Location: [facility] ORS;  Service: General;  Laterality: Right;   POLYPECTOMY  12/07/2022   Procedure: POLYPECTOMY;  Surgeon: Selena Daily, MD;  Location: [facility] ENDOSCOPY;  Service: Gastroenterology;;   PORTA CATH INSERTION  01/19/2021   SUBMUCOSAL TATTOO INJECTION  12/07/2022   Procedure: SUBMUCOSAL TATTOO INJECTION;  Surgeon: Selena Daily, MD;  Location: [facility] ENDOSCOPY;  Service: Gastroenterology;;   VIDEO BRONCHOSCOPY WITH ENDOBRONCHIAL ULTRASOUND N/A 12/29/2022   Procedure: VIDEO BRONCHOSCOPY WITH ENDOBRONCHIAL ULTRASOUND;  Surgeon: Vergia Glasgow, MD;  Location: [facility];  Service: Pulmonary;  Laterality: N/A;   XI ROBOTIC ASSISTED LOWER ANTERIOR RESECTION N/A 04/21/2023   Procedure: XI ROBOTIC ASSISTED LOWER ANTERIOR RESECTION, ILEOSTOMY CREATION;  Surgeon: Melvenia Stabs, MD;  Location: [facility];  Service: General;  Laterality: N/A;     reports that she has  been smoking cigarettes. She has never used smokeless tobacco. She reports that she does not drink alcohol and does not use drugs.  No Known Allergies  Family History  Problem Relation Age of Onset   Melanoma Father 64   Cirrhosis Maternal Grandfather    Breast cancer Paternal Grandmother     Prior to Admission medications   Medication Sig Start Date End Date Taking? Authorizing Provider  amoxicillin -clavulanate (AUGMENTIN ) 875-125 MG tablet Take 1 tablet by mouth 2 (two) times daily. Patient not taking: Reported on 06/28/2023 06/19/23   [provider]  budesonide -formoterol  (SYMBICORT ) 160-4.5 MCG/ACT inhaler  Inhale 2 puffs into the lungs 2 (two) times daily. Patient taking differently: Inhale 1 puff into the lungs daily as needed (SOB, wheezing). 01/20/23   Vergia Glasgow, MD  fluticasone  (FLONASE ) 50 MCG/ACT nasal spray Place 1 spray into both nostrils daily. Patient taking differently: Place 1 spray into both nostrils daily as needed for rhinitis or allergies. 06/19/23   Scoggins, Amber, NP  ibuprofen  (ADVIL ) 200 MG tablet Take 200-400 mg by mouth every 6 (six) hours as needed for headache.    [provider]  lidocaine -prilocaine  (EMLA ) cream APPLY TO AFFECTED AREA ONCE Patient taking differently: Apply 1 Application topically as needed (for port access). 11/09/22   Timmy Forbes, MD  magnesium  chloride (SLOW-MAG) 64 MG TBEC SR tablet Take 1 tablet (64 mg total) by mouth 2 (two) times daily. Patient not taking: Reported on 06/28/2023 03/30/21   Timmy Forbes, MD  magnesium  oxide (MAG-OX) 400 (240 Mg) MG tablet Take 800 mg by mouth daily.    [provider]  methylPREDNISolone  (MEDROL  DOSEPAK) 4 MG TBPK tablet As directed Patient not taking: Reported on 06/28/2023 06/19/23   Scoggins, Amber, NP  naloxone (NARCAN) nasal spray 4 mg/0.1 mL Place 0.4 mg into the nose once as needed (as directed). 10/28/21   [provider]  omeprazole  (PRILOSEC) 40 MG capsule Take 1 capsule (40 mg total) by mouth daily. 12/12/22   Scoggins, Amber, NP  polycarbophil (FIBERCON) 625 MG tablet Take 1 tablet (625 mg total) by mouth 2 (two) times daily. Patient taking differently: Take 625 mg by mouth daily. 04/26/23 07/25/23  Melvenia Stabs, MD  potassium chloride  (MICRO-K ) 10 MEQ CR capsule Take 1 capsule (10 mEq total) by mouth daily. 03/13/23   Scoggins, Hospital doctor, NP  VENTOLIN  HFA 108 (90 Base) MCG/ACT inhaler INHALE 1 PUFF BY MOUTH EVERY 6 HOURS AS NEEDED FOR SHORTNESS OF BREATH FOR WHEEZING Patient taking differently: Inhale 1-2 puffs into the lungs every 6 (six) hours as needed for shortness of breath or  wheezing. 04/12/23   Scoggins, Amber, NP   I have personally, briefly reviewed patient's prior medical records in [facility]  Objective: Blood pressure (!) 89/64, pulse 98, temperature 98 F (36.7 C), temperature source Oral, resp. rate 19, height 5\' 2"  (1.575 m), weight 51.3 kg, last menstrual period 11/19/2002, SpO2 99%.   Constitutional: NAD, calm, comfortable Respiratory: CTAB, no wheezing, no crackles. Normal respiratory effort. No accessory muscle use.  Cardiovascular: RRR, no murmurs / rubs / gallops. No extremity edema. 2+ pedal pulses. no clubbing / cyanosis.  Abdomen: soft, NT, ND, no masses.  Colostomy bag nearing full of watery, nonbloody diarrhea Musculoskeletal: No joint deformity upper and lower extremities.  Low muscle tone Skin: Several superficial skin abrasions, tears on right upper extremity and left knee with spontaneous hemostasis.  No joint swelling. Neurologic: Alert and oriented x 3. Normal speech. Grossly non-focal exam. PERRL  Psychiatric: Normal mood. Congruent affect.  Labs on Admission: I have personally reviewed admission labs and imaging studies  CBC    Component Value Date/Time   WBC 11.3 (H) 07/01/2023 2037   RBC 3.46 (L) 07/01/2023 2037   HGB 11.1 (L) 07/01/2023 2037   HGB 13.2 10/13/2022 1501   HGB 13.9 06/17/2022 1349   HCT 30.5 (L) 07/01/2023 2037   HCT 41.6 06/17/2022 1349   PLT 237 07/01/2023 2037   PLT 225 10/13/2022 1501   PLT 272 06/17/2022 1349   MCV 88.2 07/01/2023 2037   MCV 91 06/17/2022 1349   MCV 100 02/17/2014 0323   MCH 32.1 07/01/2023 2037   MCHC 36.4 (H) 07/01/2023 2037   RDW 14.6 07/01/2023 2037   RDW 14.5 06/17/2022 1349   RDW 14.1 02/17/2014 0323   LYMPHSABS 1.3 06/28/2023 1103   LYMPHSABS 1.8 06/17/2022 1349   LYMPHSABS 3.2 02/17/2014 0323   MONOABS 0.3 06/28/2023 1103   MONOABS 0.4 02/17/2014 0323   EOSABS 0.0 06/28/2023 1103   EOSABS 0.0 06/17/2022 1349   EOSABS 0.0 02/17/2014 0323   BASOSABS 0.0 06/28/2023  1103   BASOSABS 0.0 06/17/2022 1349   BASOSABS 0.1 02/17/2014 0323   CMP     Component Value Date/Time   NA 130 (L) 07/01/2023 2037   NA 135 06/17/2022 1349   NA 134 (L) 02/17/2014 0323   K 2.9 (L) 07/01/2023 2037   K 3.3 (L) 02/17/2014 1113   CL 98 07/01/2023 2037   CL 100 02/17/2014 0323   CO2 14 (L) 07/01/2023 2037   CO2 26 02/17/2014 0323   GLUCOSE 84 07/01/2023 2037   GLUCOSE 108 (H) 02/17/2014 0323   BUN 22 07/01/2023 2037   BUN 12 06/17/2022 1349   BUN 6 (L) 02/17/2014 0323   CREATININE 1.78 (H) 07/01/2023 2037   CREATININE 0.89 10/13/2022 1502   CREATININE 0.77 01/27/2017 1418   CALCIUM  7.7 (L) 07/01/2023 2037   CALCIUM  7.9 (L) 02/17/2014 0323   PROT 7.0 06/28/2023 1103   PROT 6.7 06/17/2022 1349   PROT 5.9 (L) 02/17/2014 0323   ALBUMIN  3.9 06/28/2023 1103   ALBUMIN  4.5 06/17/2022 1349   ALBUMIN  3.0 (L) 02/17/2014 0323   AST 22 06/28/2023 1103   AST 24 10/13/2022 1502   ALT 26 06/28/2023 1103   ALT 15 10/13/2022 1502   ALT 36 02/17/2014 0323   ALKPHOS 81 06/28/2023 1103   ALKPHOS 47 02/17/2014 0323   BILITOT 0.5 06/28/2023 1103   BILITOT 0.8 10/13/2022 1502   GFRNONAA 32 (L) 07/01/2023 2037   GFRNONAA >60 10/13/2022 1502   GFRNONAA 91 11/17/2016 1410   GFRAA >60 12/15/2019 2142   GFRAA 105 11/17/2016 1410    Radiological Exams on Admission: CT ABDOMEN PELVIS WO CONTRAST Result Date: 07/01/2023 CLINICAL DATA:  Increased ostomy output. Acute nonlocalized abdominal pain EXAM: CT ABDOMEN AND PELVIS WITHOUT CONTRAST TECHNIQUE: Multidetector CT imaging of the abdomen and pelvis was performed following the standard protocol without IV contrast. RADIATION DOSE REDUCTION: This exam was performed according to the departmental dose-optimization program which includes automated exposure control, adjustment of the mA and/or kV according to patient size and/or use of iterative reconstruction technique. COMPARISON:  MRI pelvis 01/16/2023 and PET/CT 09/26/2022 and CT  05/25/2022 FINDINGS: Lower chest: Redemonstrated 8 mm nodule in the right lower lobe (series 4/image 6) unchanged from 12/28/2022. Increased size of the 7 mm nodule in the left lower lobe (4/2), previously 4 mm. No acute abnormality. Hepatobiliary: Hypoattenuating liver lesion  along the falciform ligament measuring 1.9 cm (series 2/image 20) is new since PET/CT 09/26/2022. Additional new hypoattenuating liver lesion in the central right hepatic lobe (series 2/image 18) measuring 1.2 cm. These are concerning for metastases. Pancreas: Unremarkable. Spleen: Unremarkable. Adrenals/Urinary Tract: Normal adrenal glands. No urinary calculi or hydronephrosis. Unremarkable bladder. Stomach/Bowel: Postoperative change of low anterior resection with right lower quadrant ileostomy. No bowel obstruction or bowel wall thickening. Stomach is within normal limits. Normal appendix. Vascular/Lymphatic: Aortic atherosclerosis. No enlarged abdominal or pelvic lymph nodes. Reproductive: Uterus and bilateral adnexa are unremarkable. Other: No free intraperitoneal fluid or air. Musculoskeletal: No acute fracture. IMPRESSION: 1. No acute abnormality in the abdomen or pelvis. 2. Postoperative change of low anterior resection with right lower quadrant ileostomy. No bowel obstruction. 3. Hypoattenuating liver lesions measuring up to 1.9 cm are new since PET/CT 09/26/2022 and concerning for metastatic disease. These are incompletely evaluated without IV contrast. Consider nonemergent CT abdomen pelvis with IV contrast or PET/CT for further evaluation. 4. Increased size of the 7 mm nodule in the left lower lobe, previously 4 mm. Unchanged 8 mm nodule in the right lower lobe. 5. Aortic Atherosclerosis (ICD10-I70.0). Electronically Signed   By: Rozell Cornet M.D.   On: 07/01/2023 19:00   EKG: Independently reviewed.  NSR, heart rate 117.  Similar to prior comparisons  DVT prophylaxis: enoxaparin  (LOVENOX ) injection 30 mg Start: 07/01/23  2200 heparin  injection 5,000 Units Start: 07/01/23 2115 SCD's Start: 07/01/23 2022   Code Status: Full Family Communication: None at bedside Disposition Plan: Admit to observation. Consults called: None  Ree Candy, DO [facility]  07/01/2023, 9:55 PM    To contact the appropriate [facility] Attending or Consulting provider: Check [email or web address] for coverage from 7pm-7am

## 2023-07-01 NOTE — ED Triage Notes (Signed)
 Pt c/o being dehydrated due to her colostomy bag filling up with liquid really quickly after drinking anything. Also c/o dizziness x 2 days. Pt states she fell last night and then again this morning due to being dizzy; has skin tear x 2 on right arm.

## 2023-07-01 NOTE — ED Notes (Signed)
 Warm blankets provided by this RN.

## 2023-07-01 NOTE — ED Notes (Signed)
 Some of pt's skin tares dressed by paramedic, Bridgette Campus

## 2023-07-02 DIAGNOSIS — K219 Gastro-esophageal reflux disease without esophagitis: Secondary | ICD-10-CM | POA: Diagnosis present

## 2023-07-02 DIAGNOSIS — Z9221 Personal history of antineoplastic chemotherapy: Secondary | ICD-10-CM | POA: Diagnosis not present

## 2023-07-02 DIAGNOSIS — N179 Acute kidney failure, unspecified: Secondary | ICD-10-CM | POA: Diagnosis present

## 2023-07-02 DIAGNOSIS — R42 Dizziness and giddiness: Secondary | ICD-10-CM | POA: Diagnosis present

## 2023-07-02 DIAGNOSIS — I700 Atherosclerosis of aorta: Secondary | ICD-10-CM | POA: Diagnosis present

## 2023-07-02 DIAGNOSIS — E872 Acidosis, unspecified: Secondary | ICD-10-CM

## 2023-07-02 DIAGNOSIS — D638 Anemia in other chronic diseases classified elsewhere: Secondary | ICD-10-CM | POA: Diagnosis present

## 2023-07-02 DIAGNOSIS — F17210 Nicotine dependence, cigarettes, uncomplicated: Secondary | ICD-10-CM | POA: Diagnosis present

## 2023-07-02 DIAGNOSIS — I10 Essential (primary) hypertension: Secondary | ICD-10-CM | POA: Diagnosis present

## 2023-07-02 DIAGNOSIS — C787 Secondary malignant neoplasm of liver and intrahepatic bile duct: Secondary | ICD-10-CM | POA: Diagnosis present

## 2023-07-02 DIAGNOSIS — S80212A Abrasion, left knee, initial encounter: Secondary | ICD-10-CM | POA: Diagnosis present

## 2023-07-02 DIAGNOSIS — E871 Hypo-osmolality and hyponatremia: Secondary | ICD-10-CM | POA: Diagnosis present

## 2023-07-02 DIAGNOSIS — S40811A Abrasion of right upper arm, initial encounter: Secondary | ICD-10-CM | POA: Diagnosis present

## 2023-07-02 DIAGNOSIS — E861 Hypovolemia: Secondary | ICD-10-CM | POA: Diagnosis not present

## 2023-07-02 DIAGNOSIS — C7801 Secondary malignant neoplasm of right lung: Secondary | ICD-10-CM | POA: Diagnosis present

## 2023-07-02 DIAGNOSIS — E785 Hyperlipidemia, unspecified: Secondary | ICD-10-CM | POA: Diagnosis present

## 2023-07-02 DIAGNOSIS — Z932 Ileostomy status: Secondary | ICD-10-CM | POA: Diagnosis not present

## 2023-07-02 DIAGNOSIS — E876 Hypokalemia: Secondary | ICD-10-CM | POA: Diagnosis present

## 2023-07-02 DIAGNOSIS — E860 Dehydration: Secondary | ICD-10-CM | POA: Diagnosis present

## 2023-07-02 DIAGNOSIS — Z853 Personal history of malignant neoplasm of breast: Secondary | ICD-10-CM | POA: Diagnosis not present

## 2023-07-02 DIAGNOSIS — W19XXXA Unspecified fall, initial encounter: Secondary | ICD-10-CM | POA: Diagnosis present

## 2023-07-02 DIAGNOSIS — Z85048 Personal history of other malignant neoplasm of rectum, rectosigmoid junction, and anus: Secondary | ICD-10-CM | POA: Diagnosis not present

## 2023-07-02 DIAGNOSIS — Z9011 Acquired absence of right breast and nipple: Secondary | ICD-10-CM | POA: Diagnosis not present

## 2023-07-02 DIAGNOSIS — J449 Chronic obstructive pulmonary disease, unspecified: Secondary | ICD-10-CM | POA: Diagnosis present

## 2023-07-02 DIAGNOSIS — Z8701 Personal history of pneumonia (recurrent): Secondary | ICD-10-CM | POA: Diagnosis not present

## 2023-07-02 LAB — URINALYSIS, ROUTINE W REFLEX MICROSCOPIC
Bilirubin Urine: NEGATIVE
Glucose, UA: NEGATIVE mg/dL
Ketones, ur: NEGATIVE mg/dL
Nitrite: NEGATIVE
Protein, ur: NEGATIVE mg/dL
Specific Gravity, Urine: 1.009 (ref 1.005–1.030)
pH: 6 (ref 5.0–8.0)

## 2023-07-02 LAB — GASTROINTESTINAL PANEL BY PCR, STOOL (REPLACES STOOL CULTURE)

## 2023-07-02 LAB — BASIC METABOLIC PANEL WITH GFR
Anion gap: 6 (ref 5–15)
Anion gap: 9 (ref 5–15)
BUN: 43 mg/dL — ABNORMAL HIGH (ref 8–23)
BUN: 17 mg/dL (ref 8–23)
CO2: 26 mmol/L (ref 22–32)
CO2: 18 mmol/L — ABNORMAL LOW (ref 22–32)
Calcium: 8.7 mg/dL — ABNORMAL LOW (ref 8.9–10.3)
Calcium: 7.4 mg/dL — ABNORMAL LOW (ref 8.9–10.3)
Chloride: 107 mmol/L (ref 98–111)
Chloride: 103 mmol/L (ref 98–111)
Creatinine, Ser: 1.64 mg/dL — ABNORMAL HIGH (ref 0.44–1.00)
Creatinine, Ser: 0.89 mg/dL (ref 0.44–1.00)
GFR, Estimated: 35 mL/min — ABNORMAL LOW (ref >60)
GFR, Estimated: >60 mL/min (ref >60)
Glucose, Bld: 91 mg/dL (ref 70–99)
Glucose, Bld: 96 mg/dL (ref 70–99)
Potassium: 4.6 mmol/L (ref 3.5–5.1)
Potassium: 3.7 mmol/L (ref 3.5–5.1)
Sodium: 139 mmol/L (ref 135–145)
Sodium: 131 mmol/L — ABNORMAL LOW (ref 135–145)

## 2023-07-02 LAB — MAGNESIUM
Magnesium: 0.9 mg/dL — CL (ref 1.7–2.4)
Magnesium: 2.1 mg/dL (ref 1.7–2.4)

## 2023-07-02 LAB — SODIUM: Sodium: 130 mmol/L — ABNORMAL LOW (ref 135–145)

## 2023-07-02 LAB — PHOSPHORUS: Phosphorus: 3.7 mg/dL (ref 2.5–4.6)

## 2023-07-02 MED ORDER — POTASSIUM CHLORIDE 20 MEQ PO PACK
40.0000 meq | PACK | Freq: Once | ORAL | Status: AC
  Administered 2023-07-02: 40 meq via ORAL
  Filled 2023-07-02: qty 2

## 2023-07-02 MED ORDER — PSYLLIUM 95 % PO PACK
1.0000 | PACK | Freq: Every day | ORAL | Status: DC
  Administered 2023-07-02 – 2023-07-05 (×4): 1 via ORAL
  Filled 2023-07-02 (×4): qty 1

## 2023-07-02 MED ORDER — POTASSIUM CHLORIDE 10 MEQ/100ML IV SOLN
10.0000 meq | INTRAVENOUS | Status: AC
  Administered 2023-07-02 (×2): 10 meq via INTRAVENOUS
  Filled 2023-07-02 (×2): qty 100

## 2023-07-02 MED ORDER — MAGNESIUM SULFATE 4 GM/100ML IV SOLN
4.0000 g | Freq: Once | INTRAVENOUS | Status: AC
  Administered 2023-07-02: 4 g via INTRAVENOUS
  Filled 2023-07-02: qty 100

## 2023-07-02 MED ORDER — SODIUM BICARBONATE 8.4 % IV SOLN
INTRAVENOUS | Status: DC
  Filled 2023-07-02: qty 150

## 2023-07-02 MED ORDER — BENZOCAINE 10 % MT GEL
Freq: Three times a day (TID) | OROMUCOSAL | Status: DC | PRN
  Filled 2023-07-02: qty 9

## 2023-07-02 MED ORDER — SODIUM CHLORIDE 0.45 % IV BOLUS: 500.0000 mL | Freq: Once | INTRAVENOUS | Status: DC

## 2023-07-02 MED ORDER — PANCRELIPASE (LIP-PROT-AMYL) 12000-38000 UNITS PO CPEP
24000.0000 [IU] | ORAL_CAPSULE | Freq: Three times a day (TID) | ORAL | Status: DC
  Administered 2023-07-02 – 2023-07-05 (×10): 24000 [IU] via ORAL
  Filled 2023-07-02 (×10): qty 2

## 2023-07-02 MED ORDER — MIDODRINE HCL 5 MG PO TABS
5.0000 mg | ORAL_TABLET | Freq: Three times a day (TID) | ORAL | Status: DC
  Administered 2023-07-02 – 2023-07-03 (×3): 5 mg via ORAL
  Filled 2023-07-02 (×4): qty 1

## 2023-07-02 MED ORDER — SODIUM CHLORIDE 0.45 % IV BOLUS
500.0000 mL | Freq: Once | INTRAVENOUS | Status: AC
  Administered 2023-07-02: 500 mL via INTRAVENOUS

## 2023-07-02 MED ORDER — POTASSIUM CHLORIDE 10 MEQ/100ML IV SOLN: 10.0000 meq | INTRAVENOUS | Status: DC

## 2023-07-02 MED ORDER — SODIUM CHLORIDE 0.9 % IV BOLUS: 500.0000 mL | Freq: Once | INTRAVENOUS | Status: DC

## 2023-07-02 MED ORDER — DEXTROSE 5 % IV SOLN: INTRAVENOUS | Status: DC

## 2023-07-02 MED ORDER — SODIUM BICARBONATE 8.4 % IV SOLN: INTRAVENOUS | Status: DC

## 2023-07-02 MED ORDER — LOPERAMIDE HCL 2 MG PO CAPS
2.0000 mg | ORAL_CAPSULE | Freq: Three times a day (TID) | ORAL | Status: DC
  Administered 2023-07-02 (×2): 2 mg via ORAL
  Filled 2023-07-02 (×2): qty 1

## 2023-07-02 MED ORDER — LOPERAMIDE HCL 2 MG PO CAPS: 2.0000 mg | ORAL_CAPSULE | ORAL | Status: DC | PRN

## 2023-07-02 NOTE — Hospital Course (Signed)
 Yvonne Scott is a 63 y.o. female with a PMH significant for colostomy s/p colon resection for rectal cancer, tobacco use, invasive ductal carcinoma of right breast, neuropathy, anxiety, squamous cell lung cancer on right, rectal adenocarcinoma, GERD, HTN, fatty liver, HLD.  Patient presents to the hospital with generalized weakness and increased colostomy output. She was found to have significant electrolyte abnormality. He was also found to have a significant hypotension, received a fluid bolus.  UA mildly abnormal, due to hypotension, she is treated with Rocephin .

## 2023-07-02 NOTE — Plan of Care (Signed)

## 2023-07-02 NOTE — Progress Notes (Addendum)
  Progress Note   Patient: Yvonne Scott:[id number] DOB: 05-Nov-1960 DOA: 07/01/2023     0 DOS: the patient was seen and examined on 07/02/2023   Brief hospital course: Yvonne Scott is a 63 y.o. female with a PMH significant for colostomy s/p colon resection for rectal cancer, tobacco use, invasive ductal carcinoma of right breast, neuropathy, anxiety, squamous cell lung cancer on right, rectal adenocarcinoma, GERD, HTN, fatty liver, HLD.  Patient presents to the hospital with generalized weakness and increased colostomy output. She was found to have significant electrolyte abnormality.    Principal Problem:   AKI (acute kidney injury) (HCC) Active Problems:   Hypokalemia   Hyponatremia   Metabolic acidosis   Hypomagnesemia   Acute renal failure (ARF) (HCC)   Assessment and Plan: Acute renal failure secondary to dehydration. Diarrhea with dehydration. Hyponatremia secondary to diarrhea Hypokalemia secondary to diarrhea. Hypomagnesemia secondary to diarrhea. Metabolic acidosis. Patient had increased colostomy output, which result in severe electrolyte abnormality and metabolic acidosis. Patient will be continue receiving fluids with bicarb.  Replete magnesium  and potassium. Patient will be treated with Imodium , Metamucil, Creon . GI Panel was negative. Discussed with general surgery, patient is not scheduled for reversal today.  Small cell lung cancer, Adenocarcinoma of the lung: Invasive ductal carcinoma of the breast. Liver metastasis. Rectal cancer status post colectomy and colostomy. She will be followed by oncology as outpatient.   Addendum: 8119. Bp low, sodium went up too fast. Will give 500ml 1/2NS bolus, and d5 water  at 163ml/hr x 5 hrs.     Subjective:  Patient still very weak today, no nausea vomiting.  Still has a large amount of output from colostomy bag.  Physical Exam: Vitals:   07/01/23 2041 07/02/23 0449 07/02/23 0500 07/02/23 0810  BP: (!)  84/50 (!) 94/57 92/61 (!) 85/51  Pulse: 82 72 76 74  Resp: 19 18 18 16   Temp: 98.6 F (37 C)  98.2 F (36.8 C) 98.2 F (36.8 C)  TempSrc: Oral  Oral Oral  SpO2: 99% 96% 96% 99%  Weight: 57.7 kg     Height: 5\' 2"  (1.575 m)      General exam: Appears calm and comfortable  Respiratory system: Clear to auscultation. Respiratory effort normal. Cardiovascular system: S1 & S2 heard, RRR. No JVD, murmurs, rubs, gallops or clicks. No pedal edema. Gastrointestinal system: Abdomen is nondistended, soft and nontender. No organomegaly or masses felt. Normal bowel sounds heard. Central nervous system: Alert and oriented. No focal neurological deficits. Extremities: Symmetric 5 x 5 power. Skin: No rashes, lesions or ulcers Psychiatry: Judgement and insight appear normal. Mood & affect appropriate.    Data Reviewed:  Reviewed CT scan results and lab results.  Family Communication: None.  Patient only family member is her mother of 31 years of age.  Disposition: Status is: Inpatient Remains inpatient appropriate because: Severity of disease, IV treatment.     Time spent: 35 minutes  Author: Donaciano Frizzle, MD 07/02/2023 10:58 AM  For on call review [email or web address].

## 2023-07-02 NOTE — Plan of Care (Signed)

## 2023-07-03 DIAGNOSIS — E871 Hypo-osmolality and hyponatremia: Secondary | ICD-10-CM | POA: Diagnosis not present

## 2023-07-03 DIAGNOSIS — N179 Acute kidney failure, unspecified: Secondary | ICD-10-CM | POA: Diagnosis not present

## 2023-07-03 DIAGNOSIS — E861 Hypovolemia: Secondary | ICD-10-CM | POA: Diagnosis not present

## 2023-07-03 DIAGNOSIS — I959 Hypotension, unspecified: Secondary | ICD-10-CM | POA: Insufficient documentation

## 2023-07-03 LAB — BASIC METABOLIC PANEL WITH GFR
Anion gap: 8 (ref 5–15)
Anion gap: 7 (ref 5–15)
BUN: 13 mg/dL (ref 8–23)
BUN: 9 mg/dL (ref 8–23)
CO2: 20 mmol/L — ABNORMAL LOW (ref 22–32)
CO2: 18 mmol/L — ABNORMAL LOW (ref 22–32)
Calcium: 7.6 mg/dL — ABNORMAL LOW (ref 8.9–10.3)
Calcium: 6.6 mg/dL — ABNORMAL LOW (ref 8.9–10.3)
Chloride: 104 mmol/L (ref 98–111)
Chloride: 109 mmol/L (ref 98–111)
Creatinine, Ser: 0.71 mg/dL (ref 0.44–1.00)
Creatinine, Ser: 0.77 mg/dL (ref 0.44–1.00)
GFR, Estimated: >60 mL/min (ref >60)
GFR, Estimated: >60 mL/min (ref >60)
Glucose, Bld: 90 mg/dL (ref 70–99)
Glucose, Bld: 146 mg/dL — ABNORMAL HIGH (ref 70–99)
Potassium: 2.8 mmol/L — ABNORMAL LOW (ref 3.5–5.1)
Potassium: 4.1 mmol/L (ref 3.5–5.1)
Sodium: 132 mmol/L — ABNORMAL LOW (ref 135–145)
Sodium: 134 mmol/L — ABNORMAL LOW (ref 135–145)

## 2023-07-03 LAB — PHOSPHORUS: Phosphorus: 2 mg/dL — ABNORMAL LOW (ref 2.5–4.6)

## 2023-07-03 LAB — URINALYSIS, W/ REFLEX TO CULTURE (INFECTION SUSPECTED)
Bilirubin Urine: NEGATIVE
Glucose, UA: NEGATIVE mg/dL
Ketones, ur: NEGATIVE mg/dL
Nitrite: NEGATIVE
Protein, ur: NEGATIVE mg/dL
Specific Gravity, Urine: 1.008 (ref 1.005–1.030)
pH: 6 (ref 5.0–8.0)

## 2023-07-03 LAB — C DIFFICILE QUICK SCREEN W PCR REFLEX
C Diff antigen: NEGATIVE
C Diff interpretation: NOT DETECTED
C Diff toxin: NEGATIVE

## 2023-07-03 LAB — LACTIC ACID, PLASMA
Lactic Acid, Venous: 1.9 mmol/L (ref 0.5–1.9)
Lactic Acid, Venous: 0.8 mmol/L (ref 0.5–1.9)

## 2023-07-03 LAB — CORTISOL-AM, BLOOD: Cortisol - AM: 10.5 ug/dL (ref 6.7–22.6)

## 2023-07-03 LAB — MAGNESIUM: Magnesium: 1.8 mg/dL (ref 1.7–2.4)

## 2023-07-03 MED ORDER — GUAIFENESIN ER 600 MG PO TB12
600.0000 mg | ORAL_TABLET | Freq: Once | ORAL | Status: AC
  Administered 2023-07-03: 600 mg via ORAL
  Filled 2023-07-03: qty 1

## 2023-07-03 MED ORDER — POTASSIUM CHLORIDE 10 MEQ/100ML IV SOLN: 10.0000 meq | INTRAVENOUS | Status: DC

## 2023-07-03 MED ORDER — SODIUM CHLORIDE 0.9 % IV BOLUS
1000.0000 mL | Freq: Once | INTRAVENOUS | Status: AC
  Administered 2023-07-03: 1000 mL via INTRAVENOUS

## 2023-07-03 MED ORDER — SODIUM BICARBONATE 650 MG PO TABS
1300.0000 mg | ORAL_TABLET | Freq: Two times a day (BID) | ORAL | Status: DC
  Administered 2023-07-03 – 2023-07-05 (×4): 1300 mg via ORAL
  Filled 2023-07-03 (×4): qty 2

## 2023-07-03 MED ORDER — CHLORHEXIDINE GLUCONATE CLOTH 2 % EX PADS
6.0000 | MEDICATED_PAD | Freq: Every day | CUTANEOUS | Status: DC
Start: 2023-07-03 — End: 2023-07-05
  Administered 2023-07-03: 6 via TOPICAL

## 2023-07-03 MED ORDER — ENOXAPARIN SODIUM 40 MG/0.4ML IJ SOSY
40.0000 mg | PREFILLED_SYRINGE | INTRAMUSCULAR | Status: DC
  Administered 2023-07-03 – 2023-07-04 (×2): 40 mg via SUBCUTANEOUS
  Filled 2023-07-03 (×2): qty 0.4

## 2023-07-03 MED ORDER — POTASSIUM CHLORIDE 10 MEQ/100ML IV SOLN
10.0000 meq | INTRAVENOUS | Status: AC
  Administered 2023-07-03 (×3): 10 meq via INTRAVENOUS
  Filled 2023-07-03 (×3): qty 100

## 2023-07-03 MED ORDER — SODIUM CHLORIDE 0.9 % IV SOLN
1.0000 g | INTRAVENOUS | Status: DC
  Filled 2023-07-03: qty 10

## 2023-07-03 MED ORDER — HYDROCORTISONE SOD SUC (PF) 100 MG IJ SOLR
100.0000 mg | Freq: Three times a day (TID) | INTRAMUSCULAR | Status: DC
  Administered 2023-07-03 – 2023-07-04 (×6): 100 mg via INTRAVENOUS
  Filled 2023-07-03 (×8): qty 2

## 2023-07-03 MED ORDER — POTASSIUM PHOSPHATES 15 MMOLE/5ML IV SOLN
30.0000 mmol | Freq: Once | INTRAVENOUS | Status: AC
  Administered 2023-07-03: 30 mmol via INTRAVENOUS
  Filled 2023-07-03: qty 10

## 2023-07-03 MED ORDER — SODIUM BICARBONATE 650 MG PO TABS
650.0000 mg | ORAL_TABLET | Freq: Two times a day (BID) | ORAL | Status: DC
  Administered 2023-07-03: 650 mg via ORAL
  Filled 2023-07-03: qty 1

## 2023-07-03 MED ORDER — POTASSIUM CHLORIDE 20 MEQ PO PACK
40.0000 meq | PACK | Freq: Once | ORAL | Status: AC
  Administered 2023-07-03: 40 meq via ORAL
  Filled 2023-07-03: qty 2

## 2023-07-03 MED ORDER — SODIUM CHLORIDE 0.9 % IV SOLN
2.0000 g | INTRAVENOUS | Status: DC
  Administered 2023-07-03 – 2023-07-05 (×3): 2 g via INTRAVENOUS
  Filled 2023-07-03 (×3): qty 20

## 2023-07-03 MED ORDER — MIDODRINE HCL 5 MG PO TABS
10.0000 mg | ORAL_TABLET | Freq: Three times a day (TID) | ORAL | Status: DC
  Administered 2023-07-03 – 2023-07-04 (×4): 10 mg via ORAL
  Filled 2023-07-03 (×4): qty 2

## 2023-07-03 NOTE — Plan of Care (Signed)

## 2023-07-03 NOTE — Progress Notes (Signed)
 Anticoagulation monitoring(Lovenox ):  62yo  F ordered Lovenox  30 mg Q24h    Filed Weights   07/01/23 1436 07/01/23 2041  Weight: 51.3 kg (113 lb) 57.7 kg (127 lb 3.3 oz)   BMI 23   Lab Results  Component Value Date   CREATININE 0.71 07/03/2023   CREATININE 0.89 07/02/2023   CREATININE 1.64 (H) 07/02/2023   Estimated Creatinine Clearance: 56.9 mL/min (by C-G formula based on SCr of 0.71 mg/dL). Hemoglobin & Hematocrit     Component Value Date/Time   HGB 11.1 (L) 07/01/2023 2037   HGB 13.2 10/13/2022 1501   HGB 13.9 06/17/2022 1349   HCT 30.5 (L) 07/01/2023 2037   HCT 41.6 06/17/2022 1349     Per Protocol for Patient with estCrcl now > 30 ml/min and BMI < 30, will transition to Lovenox  40 mg Q24h       Thomasine Flick PharmD Clinical Pharmacist 07/03/2023

## 2023-07-03 NOTE — Plan of Care (Signed)

## 2023-07-03 NOTE — Progress Notes (Signed)
 Patient has orders for strict I&Os. However she kept taking to hat out of the toilet so staff was not able to access her output.

## 2023-07-03 NOTE — Progress Notes (Signed)
   CROSS COVER NOTE  Patient name: Yvonne Scott MRN: [id number] DOB : 10/20/1960  Concern as stated by nurse / staff   This patient was just given a 500ml bolus by Sonny Dust on day shift, and her BP is now 77/51 . She is asymptomatic. How do you want me to manage this?    Review of Pertinent findings: Despite fluids, continued hypotension to MAP 65-70. Na+ quickly corrected. Fortunately, patient is asymptomatic.   Assessment and  Interventions   Assessment:asymptomatic hypotension resistant to IV fluids.   Plan: Added midodrine  for short term.  Requested q1hr BP checks x3 hours IV fluids expired and as BP improved, will continue to monitor off     '

## 2023-07-03 NOTE — Progress Notes (Addendum)
 Progress Note   Patient: Yvonne Scott ZOX:[id number] DOB: [birth year] DOA: 07/01/2023     1 DOS: the patient was seen and examined on 07/03/2023   Brief hospital course: Yvonne Scott is a 63 y.o. female with a PMH significant for colostomy s/p colon resection for rectal cancer, tobacco use, invasive ductal carcinoma of right breast, neuropathy, anxiety, squamous cell lung cancer on right, rectal adenocarcinoma, GERD, HTN, fatty liver, HLD.  Patient presents to the hospital with generalized weakness and increased colostomy output. She was found to have significant electrolyte abnormality. He was also found to have a significant hypotension, received a fluid bolus.  UA mildly abnormal, due to hypotension, she is treated with Rocephin .    Principal Problem:   AKI (acute kidney injury) (HCC) Active Problems:   Hypokalemia   Hyponatremia   Metabolic acidosis   Hypomagnesemia   Acute renal failure (ARF) (HCC)   Hypotension   Assessment and Plan: Hypotension. Received IV fluid bolus yesterday evening, but developed more hypotension after hours.  Midodrine  was started. Blood pressure still low this morning, however, renal function has normalized. Recheck blood pressure on both arms, blood pressure still low. Etiology of hypotension is unclear, UA is mildly abnormal, will send urine culture and blood culture.  Also started on Rocephin  for now until cultures available. Patient has multiple cancer treatment in the past, most likely has used steroids, patient most likely will have adrenal insufficiency with adrenal crisis.  Patient be treated with just dose of hydrocortisone .  She will be given another liter of fluid bolus to stabilize blood pressure. Will continue to follow closely.  Acute renal failure secondary to dehydration. Diarrhea with dehydration. Hyponatremia secondary to diarrhea Hypokalemia secondary to diarrhea. Hypomagnesemia secondary to diarrhea. Metabolic  acidosis. Patient had increased colostomy output, which result in severe electrolyte abnormality and metabolic acidosis. 4/20. Patient will be continue receiving fluids with bicarb.  Replete magnesium  and potassium. Patient will be treated with Imodium , Metamucil, Creon . GI Panel was negative. Discussed with general surgery, patient is not scheduled for reversal today. Repeated BMP in afternoon showed a rapid correction of sodium, patient received D5 water  to bring down the sodium. 4/21.  Renal function normalized, continue replete potassium and phosphate.  Also added sodium bicarb for mild metabolic acidosis.   Small cell lung cancer, Adenocarcinoma of the lung: Invasive ductal carcinoma of the breast. Liver metastasis. Rectal cancer status post colectomy and colostomy. She will be followed by oncology as outpatient.  Addendum: 8119.  Repeat bp is low. Patient has very faint pulse on left arm, had right mastectomy. Leg bp was 100/61.  Not sure about true bp.  Pt renal function has normalized. I will give another liter of bolus, stat lactic acid.  If lactic acid is elevated, patient needs presser if not responding to IVF.      Subjective:  Feel much better, diarrhea has resolved.  No nausea vomiting.  No abdominal pain.  Physical Exam: Vitals:   07/03/23 0502 07/03/23 0503 07/03/23 0716 07/03/23 0803  BP: (!) 83/56 (!) 83/56 (!) 77/52 (!) 81/50  Pulse: 79  78 78  Resp:   18 20  Temp:   98.9 F (37.2 C)   TempSrc:      SpO2:   97%   Weight:      Height:       General exam: Appears calm and comfortable  Respiratory system: Clear to auscultation. Respiratory effort normal. Cardiovascular system: S1 & S2 heard, RRR. No JVD,  murmurs, rubs, gallops or clicks. No pedal edema. Gastrointestinal system: Abdomen is nondistended, soft and nontender. No organomegaly or masses felt. Normal bowel sounds heard. Central nervous system: Alert and oriented x3. No focal neurological  deficits. Extremities: Symmetric 5 x 5 power. Skin: No rashes, lesions or ulcers Psychiatry: Judgement and insight appear normal. Mood & affect appropriate.    Data Reviewed:  Lab results reviewed.  Family Communication: None  Disposition: Status is: Inpatient Remains inpatient appropriate because: Severity of disease, IV treatment.     Time spent: 60 minutes  Author: Donaciano Frizzle, MD 07/03/2023 9:32 AM  For on call review [email or web address].

## 2023-07-04 DIAGNOSIS — N179 Acute kidney failure, unspecified: Secondary | ICD-10-CM | POA: Diagnosis not present

## 2023-07-04 DIAGNOSIS — E876 Hypokalemia: Secondary | ICD-10-CM | POA: Diagnosis not present

## 2023-07-04 DIAGNOSIS — E871 Hypo-osmolality and hyponatremia: Secondary | ICD-10-CM | POA: Diagnosis not present

## 2023-07-04 LAB — URINE CULTURE: Culture: <10000 — AB

## 2023-07-04 LAB — BASIC METABOLIC PANEL WITH GFR
Anion gap: 6 (ref 5–15)
BUN: 8 mg/dL (ref 8–23)
CO2: 18 mmol/L — ABNORMAL LOW (ref 22–32)
Calcium: 7.3 mg/dL — ABNORMAL LOW (ref 8.9–10.3)
Chloride: 108 mmol/L (ref 98–111)
Creatinine, Ser: 0.69 mg/dL (ref 0.44–1.00)
GFR, Estimated: >60 mL/min (ref >60)
Glucose, Bld: 131 mg/dL — ABNORMAL HIGH (ref 70–99)
Potassium: 3.9 mmol/L (ref 3.5–5.1)
Sodium: 132 mmol/L — ABNORMAL LOW (ref 135–145)

## 2023-07-04 LAB — CBC
HCT: 24.8 % — ABNORMAL LOW (ref 36.0–46.0)
Hemoglobin: 8.8 g/dL — ABNORMAL LOW (ref 12.0–15.0)
MCH: 31.7 pg (ref 26.0–34.0)
MCHC: 35.5 g/dL (ref 30.0–36.0)
MCV: 89.2 fL (ref 80.0–100.0)
Platelets: 220 10*3/uL (ref 150–400)
RBC: 2.78 MIL/uL — ABNORMAL LOW (ref 3.87–5.11)
RDW: 14.6 % (ref 11.5–15.5)
WBC: 9.5 10*3/uL (ref 4.0–10.5)
nRBC: 0 % (ref 0.0–0.2)

## 2023-07-04 LAB — PHOSPHORUS: Phosphorus: 2.6 mg/dL (ref 2.5–4.6)

## 2023-07-04 LAB — MAGNESIUM: Magnesium: 1.3 mg/dL — ABNORMAL LOW (ref 1.7–2.4)

## 2023-07-04 LAB — IRON AND TIBC
Iron: 31 ug/dL (ref 28–170)
Saturation Ratios: 15 % (ref 10.4–31.8)
TIBC: 209 ug/dL — ABNORMAL LOW (ref 250–450)
UIBC: 178 ug/dL

## 2023-07-04 LAB — FERRITIN: Ferritin: 216 ng/mL (ref 11–307)

## 2023-07-04 MED ORDER — GUAIFENESIN ER 600 MG PO TB12
600.0000 mg | ORAL_TABLET | Freq: Two times a day (BID) | ORAL | Status: DC
  Administered 2023-07-04 – 2023-07-05 (×3): 600 mg via ORAL
  Filled 2023-07-04 (×3): qty 1

## 2023-07-04 MED ORDER — MAGNESIUM SULFATE 4 GM/100ML IV SOLN
4.0000 g | Freq: Once | INTRAVENOUS | Status: AC
  Administered 2023-07-04: 4 g via INTRAVENOUS
  Filled 2023-07-04: qty 100

## 2023-07-04 MED ORDER — GUAIFENESIN 100 MG/5ML PO LIQD: 5.0000 mL | ORAL | Status: DC | PRN

## 2023-07-04 NOTE — TOC Initial Note (Signed)
 Transition of Care [facility]) - Initial/Assessment Note    Patient Details  Name: Yvonne Scott MRN: [id number] Date of Birth: Apr 22, 1960  [facility] Care [facility]) CM/SW Contact:    Elsie Halo, RN Phone Number: 07/04/2023, 10:33 AM  Clinical Narrative:                 Patient is from home. She lives with her father and helps with his care. The patient drives, but has limitations due to cataracts. She has a PCP and doesn't report any difficulty getting to MD appointments or picking up medications. No DME is uses.  No TOC needs. Please outreach to [facility] if needs are identified.         Patient Goals and CMS Choice            Expected Discharge Plan and Services                                              Prior Living Arrangements/Services                       Activities of Daily Living   ADL Screening (condition at time of admission) Independently performs ADLs?: Yes (appropriate for developmental age) Is the patient deaf or have difficulty hearing?: No Does the patient have difficulty seeing, even when wearing glasses/contacts?: No Does the patient have difficulty concentrating, remembering, or making decisions?: No  Permission Sought/Granted                  Emotional Assessment              Admission diagnosis:  AKI (acute kidney injury) (HCC) [N17.9] Acute renal failure (ARF) (HCC) [N17.9] Patient Active Problem List   Diagnosis Date Noted   Hypotension 07/03/2023   Acute renal failure (ARF) (HCC) 07/02/2023   Acute URI 06/19/2023   Cough 05/15/2023   Acute bronchitis 05/15/2023   Ileostomy care (HCC) 05/03/2023   S/P partial colectomy 04/21/2023   Pain in the abdomen 02/06/2023   Genetic testing 01/24/2023   Abnormal PET scan of colon 12/07/2022   Rectal adenocarcinoma (HCC) 10/13/2022   Squamous cell lung cancer, right (HCC) 08/12/2022   Mixed hyperlipidemia 06/21/2022   Cramps, muscle, general 06/21/2022   Other  fatigue 06/21/2022   Rib pain on right side 01/25/2022   Vitamin D  deficiency 01/25/2022   Thrush 01/25/2022   B12 deficiency 01/06/2022   Protein-calorie malnutrition, severe 09/07/2021   Dysphagia 09/05/2021   Prolonged QT interval 09/05/2021   Syncope, vasovagal 09/04/2021   Syncope 09/04/2021   Antineoplastic chemotherapy induced anemia 08/24/2021   Neutropenic fever (HCC)    HCAP (healthcare-associated pneumonia) 07/16/2021   Sepsis (HCC) 07/16/2021   Pancytopenia (HCC)    Port-A-Cath in place 07/07/2021   Anxiety associated with cancer diagnosis (HCC) 07/07/2021   Chemotherapy-induced neuropathy (HCC) 06/08/2021   Anxiety 06/08/2021   Hypomagnesemia 04/13/2021   Generalized body aches 03/11/2021   Neoplasm related pain 03/11/2021   Encounter for antineoplastic chemotherapy 03/11/2021   Encounter for monitoring cardiotoxic drug therapy 02/27/2021   Invasive ductal carcinoma of right breast (HCC) 02/20/2021   Goals of care, counseling/discussion 02/20/2021   Metabolic acidosis 02/22/2020   DKA (diabetic ketoacidosis) (HCC) 02/21/2020   Elevated LFTs 02/21/2020   AKI (acute kidney injury) (HCC) 02/21/2020   Leukocytosis 02/21/2020   Hyponatremia  01/30/2017   Hypochloremia 01/30/2017   Hypokalemia 01/27/2017   Hyperglycemia 01/27/2017   Back pain 12/15/2016   Chronic lower back pain 12/15/2016   Elevated ferritin 11/07/2016   Aortic atherosclerosis (HCC) 10/24/2016   Fatty liver 10/24/2016   Thrombocytopenia (HCC) 10/24/2016   Medication monitoring encounter 11/19/2015   GERD (gastroesophageal reflux disease) 11/19/2015   Tobacco abuse 11/19/2015   Essential hypertension, benign 11/19/2015   PCP:  Alica Antu, NP Pharmacy:   [facility] [address]), [place] - [address] [place]) [place] [address] Phone: [phone] Fax: [phone]     Social Drivers of Health (SDOH) Social History: SDOH Screenings    Food Insecurity: No Food Insecurity (07/01/2023)  Housing: Low Risk  (07/02/2023)  Transportation Needs: No Transportation Needs (07/01/2023)  Utilities: Not At Risk (07/01/2023)  Alcohol Screen: Low Risk  (01/06/2022)  Depression (PHQ2-9): Medium Risk (01/06/2022)  Financial Resource Strain: Low Risk  (01/06/2022)  Physical Activity: Inactive (01/06/2022)  Social Connections: Moderately Isolated (07/01/2023)  Stress: Stress Concern Present (01/06/2022)  Tobacco Use: High Risk (07/01/2023)   SDOH Interventions:     Readmission Risk Interventions    04/23/2023    1:26 PM  Readmission Risk Prevention Plan  Transportation Screening Complete  PCP or Specialist Appt within 5-7 Days Complete  Home Care Screening Complete  Medication Review (RN CM) Complete

## 2023-07-04 NOTE — Progress Notes (Addendum)
 Progress Note   Patient: Yvonne Scott ZOX:[id number] DOB: 01/17/1961 DOA: 07/01/2023     2 DOS: the patient was seen and examined on 07/04/2023   Brief hospital course: Yvonne Scott is a 63 y.o. female with a PMH significant for colostomy s/p colon resection for rectal cancer, tobacco use, invasive ductal carcinoma of right breast, neuropathy, anxiety, squamous cell lung cancer on right, rectal adenocarcinoma, GERD, HTN, fatty liver, HLD.  Patient presents to the hospital with generalized weakness and increased colostomy output. She was found to have significant electrolyte abnormality. He was also found to have a significant hypotension, received a fluid bolus.  UA mildly abnormal, due to hypotension, she is treated with Rocephin .    Principal Problem:   AKI (acute kidney injury) (HCC) Active Problems:   Hypokalemia   Hyponatremia   Metabolic acidosis   Hypomagnesemia   Acute renal failure (ARF) (HCC)   Hypotension   Assessment and Plan: Hypotension. Relative adrenal insufficiency. Possible UTI. Has persistent hypotension.  Received multiple IV fluid bolus.  This is secondary to dehydration, possible UTI.  Patient did not meet sepsis criteria at time of admission.  Cultures so far negative, urine culture pending.  Patient has been treated with Rocephin .  Also started on midodrine  and stress dose hydrocortisone  for relative adrenal insufficiency. Blood pressure is more stable, she has right mastectomy, blood pressure has been measured on the left side.  When checked, left-sided blood pressure is lower than the right side, significant reduced radial pulse in the left. As a result, left blood pressure should be okay if above 80 mmHg.    Acute renal failure secondary to dehydration. Diarrhea with dehydration.  Increased colostomy output. Hyponatremia secondary to diarrhea Hypokalemia secondary to diarrhea. Hypomagnesemia secondary to diarrhea. Metabolic  acidosis. Hypophosphatemia. Patient had increased colostomy output, which result in severe electrolyte abnormality and metabolic acidosis. 4/20. Patient will be continue receiving fluids with bicarb.  Replete magnesium  and potassium. Patient will be treated with Imodium , Metamucil, Creon . GI Panel was negative. Discussed with general surgery, patient is not scheduled for reversal today. Repeated BMP in afternoon showed a rapid correction of sodium, patient received D5 water  to bring down the sodium. 4/21.  Renal function normalized, continue replete potassium and phosphate.  Also added sodium bicarb for mild metabolic acidosis. 4/22.  Has a mild hyponatremia, continue replete magnesium .  Continue sodium bicarb orally.  Colostomy output much better, with stool much more thickened. She has scheduled colostomy reversal on Friday in [facility].   Acute on chronic anemia. Patient seems to large amount of IV fluids, anemia could be from dilution.  Recently B12 level elevated, recheck iron B12 level.  Follow hemoglobin.  Small cell lung cancer, Adenocarcinoma of the lung: Invasive ductal carcinoma of the breast. Liver metastasis. Rectal cancer status post colectomy and colostomy. She will be followed by oncology as outpatient.      Subjective: Patient feels much better, no longer feels dizzy.  Colostomy output much better.  Physical Exam: Vitals:   07/03/23 1620 07/03/23 2016 07/04/23 0506 07/04/23 1004  BP: (!) 91/55 92/61 (!) 84/50 (!) 88/52  Pulse: 71 70 81 84  Resp: 18 16 16 18   Temp: 97.8 F (36.6 C)  98.2 F (36.8 C) 97.6 F (36.4 C)  TempSrc:   Oral   SpO2: 99% 98% 99% 100%  Weight:      Height:       General exam: Appears calm and comfortable  Respiratory system: Clear  to auscultation. Respiratory effort normal. Cardiovascular system: S1 & S2 heard, RRR. No JVD, murmurs, rubs, gallops or clicks. No pedal edema. Gastrointestinal system: Abdomen is nondistended,  soft and nontender. No organomegaly or masses felt. Normal bowel sounds heard. Central nervous system: Alert and oriented. No focal neurological deficits. Extremities: Symmetric 5 x 5 power. Skin: No rashes, lesions or ulcers Psychiatry: Judgement and insight appear normal. Mood & affect appropriate.    Data Reviewed:  Lab results much improved.  Family Communication: None  Disposition: Status is: Inpatient Remains inpatient appropriate because: Severity of disease, IV treatment.     Time spent: 50 minutes  Author: Donaciano Frizzle, MD 07/04/2023 10:35 AM  For on call review [email or web address].

## 2023-07-04 NOTE — Progress Notes (Signed)
   07/04/23 1615  Spiritual Encounters  Type of Visit Initial  Care provided to: Patient  Conversation partners present during encounter Nurse  Reason for visit Routine spiritual support  OnCall Visit No   Chaplain visited patient per Spiritual Care Consult in the system.  Patient was with the Nurse so shared she be back.  Passed on the Consult to PRN Chaplain colleague for them to visit patient later this evening due to shift change.    Rev. Rana M. Nolon Baxter, M.Div. Chaplain Resident [facility]

## 2023-07-04 NOTE — Plan of Care (Signed)

## 2023-07-05 ENCOUNTER — Other Ambulatory Visit: Payer: Self-pay

## 2023-07-05 ENCOUNTER — Encounter: Payer: Self-pay | Admitting: Oncology

## 2023-07-05 DIAGNOSIS — N179 Acute kidney failure, unspecified: Secondary | ICD-10-CM | POA: Diagnosis not present

## 2023-07-05 LAB — BASIC METABOLIC PANEL WITH GFR
Anion gap: 7 (ref 5–15)
BUN: 9 mg/dL (ref 8–23)
CO2: 20 mmol/L — ABNORMAL LOW (ref 22–32)
Calcium: 7.7 mg/dL — ABNORMAL LOW (ref 8.9–10.3)
Chloride: 109 mmol/L (ref 98–111)
Creatinine, Ser: 0.78 mg/dL (ref 0.44–1.00)
GFR, Estimated: >60 mL/min (ref >60)
Glucose, Bld: 116 mg/dL — ABNORMAL HIGH (ref 70–99)
Potassium: 3.5 mmol/L (ref 3.5–5.1)
Sodium: 136 mmol/L (ref 135–145)

## 2023-07-05 LAB — CBC
HCT: 24.6 % — ABNORMAL LOW (ref 36.0–46.0)
Hemoglobin: 8.7 g/dL — ABNORMAL LOW (ref 12.0–15.0)
MCH: 31.4 pg (ref 26.0–34.0)
MCHC: 35.4 g/dL (ref 30.0–36.0)
MCV: 88.8 fL (ref 80.0–100.0)
Platelets: 268 10*3/uL (ref 150–400)
RBC: 2.77 MIL/uL — ABNORMAL LOW (ref 3.87–5.11)
RDW: 14.8 % (ref 11.5–15.5)
WBC: 10.1 10*3/uL (ref 4.0–10.5)
nRBC: 0 % (ref 0.0–0.2)

## 2023-07-05 LAB — MAGNESIUM: Magnesium: 2 mg/dL (ref 1.7–2.4)

## 2023-07-05 LAB — PHOSPHORUS: Phosphorus: 2.5 mg/dL (ref 2.5–4.6)

## 2023-07-05 MED ORDER — MIDODRINE HCL 5 MG PO TABS
2.5000 mg | ORAL_TABLET | Freq: Three times a day (TID) | ORAL | Status: DC
  Administered 2023-07-05 (×2): 2.5 mg via ORAL
  Filled 2023-07-05 (×2): qty 1

## 2023-07-05 MED ORDER — FLUTICASONE PROPIONATE 50 MCG/ACT NA SUSP: 1.0000 | Freq: Every day | NASAL | Status: DC | PRN

## 2023-07-05 MED ORDER — HEPARIN SOD (PORK) LOCK FLUSH 100 UNIT/ML IV SOLN
500.0000 [IU] | Freq: Once | INTRAVENOUS | Status: AC
  Administered 2023-07-05: 500 [IU] via INTRAVENOUS
  Filled 2023-07-05: qty 5

## 2023-07-05 MED ORDER — FIBERCON 625 MG PO TABS
625.0000 mg | ORAL_TABLET | Freq: Every day | ORAL | Status: DC
Start: 2023-07-05 — End: 2023-07-12

## 2023-07-05 MED ORDER — MIDODRINE HCL 5 MG PO TABS
5.0000 mg | ORAL_TABLET | Freq: Three times a day (TID) | ORAL | 0 refills | Status: DC
  Filled 2023-07-05 (×2): qty 15, 5d supply, fill #0

## 2023-07-05 MED ORDER — SODIUM BICARBONATE 650 MG PO TABS
650.0000 mg | ORAL_TABLET | Freq: Two times a day (BID) | ORAL | 0 refills | Status: AC
  Filled 2023-07-05 (×2): qty 6, 3d supply, fill #0

## 2023-07-05 NOTE — TOC Transition Note (Signed)
 Transition of Care [facility]) - Discharge Note   Patient Details  Name: Yvonne Scott MRN: [id number] Date of Birth: Jan 11, 1961  [facility] CM/SW Contact:  Elsie Halo, RN Phone Number: 07/05/2023, 12:20 PM   Clinical Narrative:    Patient is medically clear for dc to home. Patient provided a taxi voucher to transport home. The waiver is signed by the patient and was placed in the patient's chart. No other TOC needs identified.   Final next level of care: Home/Self Care Barriers to Discharge: Continued Medical Work up   Patient Goals and CMS Choice            Discharge Placement                       Discharge Plan and Services Additional resources added to the After Visit Summary for                                       Social Drivers of Health (SDOH) Interventions SDOH Screenings   Food Insecurity: No Food Insecurity (07/01/2023)  Housing: Low Risk  (07/02/2023)  Transportation Needs: No Transportation Needs (07/01/2023)  Utilities: Not At Risk (07/01/2023)  Alcohol Screen: Low Risk  (01/06/2022)  Depression (PHQ2-9): Medium Risk (01/06/2022)  Financial Resource Strain: Low Risk  (01/06/2022)  Physical Activity: Inactive (01/06/2022)  Social Connections: Moderately Isolated (07/01/2023)  Stress: Stress Concern Present (01/06/2022)  Tobacco Use: High Risk (07/01/2023)     Readmission Risk Interventions    04/23/2023    1:26 PM  Readmission Risk Prevention Plan  Transportation Screening Complete  PCP or Specialist Appt within 5-7 Days Complete  Home Care Screening Complete  Medication Review (RN CM) Complete

## 2023-07-05 NOTE — Discharge Summary (Addendum)
 Physician Discharge Summary   Patient: Yvonne Scott MRN: [id number] DOB: 1961/02/22  Admit date:     07/01/2023  Discharge date: 07/05/23  Discharge Physician: Sheril Dines   PCP: Alica Antu, NP   Recommendations at discharge:   Follow-up with PCP in 1 week Follow-up with surgeon for scheduled surgery at [facility] on Friday, 07/07/2023  Discharge Diagnoses: Principal Problem:   AKI (acute kidney injury) (HCC) Active Problems:   Hypokalemia   Hyponatremia   Metabolic acidosis   Hypomagnesemia   Acute renal failure (ARF) (HCC)   Hypotension  Resolved Problems:   * No resolved hospital problems. *  Hospital Course:   Yvonne Scott is a 63 y.o. female with a PMH significant for colostomy s/p colon resection for rectal cancer, tobacco use, invasive ductal carcinoma of right breast, neuropathy, anxiety, squamous cell lung cancer on right, rectal adenocarcinoma, GERD, HTN, fatty liver, HLD.  She presented to the hospital with generalized weakness and increased colostomy output. She was found to have significant electrolyte abnormality and hypotension. She also had abnormal urinalysis concerning for UTI.  She was treated with IV fluids and empiric IV ceftriaxone .    Assessment and Plan:   Hypotension. Relative adrenal insufficiency. Possible UTI. Hypotension has improved. She was treated with IV hydrocortisone , IV fluids and midodrine . She will be discharged on midodrine . Urine culture did not show any significant growth.  UTI has been ruled out. She completed 3 days of IV ceftriaxone     Acute renal failure secondary to dehydration. Diarrhea with dehydration.  Increased colostomy output. Hyponatremia secondary to diarrhea Hypokalemia secondary to diarrhea. Hypomagnesemia secondary to diarrhea. Metabolic acidosis. Hypophosphatemia. Patient had increased colostomy output, which resulted in severe electrolyte abnormality and metabolic  acidosis. Hyponatremia, hypokalemia, hypomagnesemia and hypophosphatemia have all resolved. Continue sodium bicarbonate  for metabolic acidosis. Diarrhea has improved. GI Panel was negative.  She has scheduled colostomy reversal on Friday, 06/06/2023 at [facility].   Acute on chronic anemia. There was probably from hemodilution. Patient seems to large amount of IV fluids, anemia could be from dilution.  Recently B12 level elevated, Iron studies not consistent with iron deficiency anemia    Small cell lung cancer, Adenocarcinoma of the lung: Invasive ductal carcinoma of the breast. Liver metastasis. Rectal cancer status post colectomy and colostomy. She will be followed by oncology as outpatient.    Her condition has improved and she is deemed stable for discharge to home today.       Consultants: None Procedures performed: None Disposition: Home Diet recommendation:  Discharge Diet Orders (From admission, onward)     Start     Ordered   07/05/23 0000  Diet general        07/05/23 1147           Regular diet DISCHARGE MEDICATION: Allergies as of 07/05/2023   No Known Allergies      Medication List     STOP taking these medications    amoxicillin -clavulanate 875-125 MG tablet Commonly known as: AUGMENTIN    magnesium  chloride 64 MG Tbec SR tablet Commonly known as: SLOW-MAG   methylPREDNISolone  4 MG Tbpk tablet Commonly known as: MEDROL  DOSEPAK   potassium chloride  10 MEQ CR capsule Commonly known as: MICRO-K        TAKE these medications    budesonide -formoterol  160-4.5 MCG/ACT inhaler Commonly known as: Symbicort  Inhale 2 puffs into the lungs 2 (two) times daily. What changed:  how much to take when to take this reasons to take  this   FiberCon 625 MG tablet Generic drug: polycarbophil Take 1 tablet (625 mg total) by mouth daily.   fluticasone  50 MCG/ACT nasal spray Commonly known as: FLONASE  Place 1 spray into both nostrils  daily as needed for rhinitis or allergies.   ibuprofen  200 MG tablet Commonly known as: ADVIL  Take 200-400 mg by mouth every 6 (six) hours as needed for headache.   lidocaine -prilocaine  cream Commonly known as: EMLA  APPLY TO AFFECTED AREA ONCE   magnesium  oxide 400 (240 Mg) MG tablet Commonly known as: MAG-OX Take 800 mg by mouth daily.   midodrine  5 MG tablet Commonly known as: PROAMATINE  Take 1 tablet (5 mg total) by mouth 3 (three) times daily with meals for 5 days.   naloxone 4 MG/0.1ML Liqd nasal spray kit Commonly known as: NARCAN Place 0.4 mg into the nose once as needed (as directed).   omeprazole  40 MG capsule Commonly known as: PRILOSEC Take 1 capsule (40 mg total) by mouth daily.   sodium bicarbonate  650 MG tablet Take 1 tablet (650 mg total) by mouth 2 (two) times daily for 3 days.   Ventolin  HFA 108 (90 Base) MCG/ACT inhaler Generic drug: albuterol  INHALE 1 PUFF BY MOUTH EVERY 6 HOURS AS NEEDED FOR SHORTNESS OF BREATH FOR WHEEZING What changed: See the new instructions.        Follow-up Information     Scoggins, Amber, NP Follow up.   Specialty: Cardiology Why: Hospital follow up Contact information: [address] [place] [address] [phone]                Discharge Exam: Cleavon Curls Weights   07/01/23 1436 07/01/23 2041  Weight: 51.3 kg 57.7 kg   GEN: NAD SKIN: Warm and dry EYES: No pallor or icterus ENT: MMM CV: RRR PULM: CTA B ABD: soft, ND, NT, +BS, + colostomy bag with liquid stools CNS: AAO x 3, non focal EXT: No edema or tenderness   Condition at discharge: good  The results of significant diagnostics from this hospitalization (including imaging, microbiology, ancillary and laboratory) are listed below for reference.   Imaging Studies: CT ABDOMEN PELVIS WO CONTRAST Result Date: 07/01/2023 CLINICAL DATA:  Increased ostomy output. Acute nonlocalized abdominal pain EXAM: CT ABDOMEN AND PELVIS WITHOUT CONTRAST TECHNIQUE:  Multidetector CT imaging of the abdomen and pelvis was performed following the standard protocol without IV contrast. RADIATION DOSE REDUCTION: This exam was performed according to the departmental dose-optimization program which includes automated exposure control, adjustment of the mA and/or kV according to patient size and/or use of iterative reconstruction technique. COMPARISON:  MRI pelvis 01/16/2023 and PET/CT 09/26/2022 and CT 05/25/2022 FINDINGS: Lower chest: Redemonstrated 8 mm nodule in the right lower lobe (series 4/image 6) unchanged from 12/28/2022. Increased size of the 7 mm nodule in the left lower lobe (4/2), previously 4 mm. No acute abnormality. Hepatobiliary: Hypoattenuating liver lesion along the falciform ligament measuring 1.9 cm (series 2/image 20) is new since PET/CT 09/26/2022. Additional new hypoattenuating liver lesion in the central right hepatic lobe (series 2/image 18) measuring 1.2 cm. These are concerning for metastases. Pancreas: Unremarkable. Spleen: Unremarkable. Adrenals/Urinary Tract: Normal adrenal glands. No urinary calculi or hydronephrosis. Unremarkable bladder. Stomach/Bowel: Postoperative change of low anterior resection with right lower quadrant ileostomy. No bowel obstruction or bowel wall thickening. Stomach is within normal limits. Normal appendix. Vascular/Lymphatic: Aortic atherosclerosis. No enlarged abdominal or pelvic lymph nodes. Reproductive: Uterus and bilateral adnexa are unremarkable. Other: No free intraperitoneal fluid or air. Musculoskeletal: No acute fracture.  IMPRESSION: 1. No acute abnormality in the abdomen or pelvis. 2. Postoperative change of low anterior resection with right lower quadrant ileostomy. No bowel obstruction. 3. Hypoattenuating liver lesions measuring up to 1.9 cm are new since PET/CT 09/26/2022 and concerning for metastatic disease. These are incompletely evaluated without IV contrast. Consider nonemergent CT abdomen pelvis with IV  contrast or PET/CT for further evaluation. 4. Increased size of the 7 mm nodule in the left lower lobe, previously 4 mm. Unchanged 8 mm nodule in the right lower lobe. 5. Aortic Atherosclerosis (ICD10-I70.0). Electronically Signed   By: Rozell Cornet M.D.   On: 07/01/2023 19:00    Microbiology: Results for orders placed or performed during the hospital encounter of 07/01/23  Gastrointestinal Panel by PCR , Stool     Status: None   Collection Time: 07/01/23  4:06 PM   Specimen: Stool  Result Value Ref Range Status   Campylobacter species NOT DETECTED NOT DETECTED Final   Plesimonas shigelloides NOT DETECTED NOT DETECTED Final   Salmonella species NOT DETECTED NOT DETECTED Final   Yersinia enterocolitica NOT DETECTED NOT DETECTED Final   Vibrio species NOT DETECTED NOT DETECTED Final   Vibrio cholerae NOT DETECTED NOT DETECTED Final   Enteroaggregative E coli (EAEC) NOT DETECTED NOT DETECTED Final   Enteropathogenic E coli (EPEC) NOT DETECTED NOT DETECTED Final   Enterotoxigenic E coli (ETEC) NOT DETECTED NOT DETECTED Final   Shiga like toxin producing E coli (STEC) NOT DETECTED NOT DETECTED Final   Shigella/Enteroinvasive E coli (EIEC) NOT DETECTED NOT DETECTED Final   Cryptosporidium NOT DETECTED NOT DETECTED Final   Cyclospora cayetanensis NOT DETECTED NOT DETECTED Final   Entamoeba histolytica NOT DETECTED NOT DETECTED Final   Giardia lamblia NOT DETECTED NOT DETECTED Final   Adenovirus F40/41 NOT DETECTED NOT DETECTED Final   Astrovirus NOT DETECTED NOT DETECTED Final   Norovirus GI/GII NOT DETECTED NOT DETECTED Final   Rotavirus A NOT DETECTED NOT DETECTED Final   Sapovirus (I, II, IV, and V) NOT DETECTED NOT DETECTED Final    Comment: Performed at [facility], [address] [place], [place] [address]  Culture, blood (Routine X 2) w Reflex to ID Panel     Status: None (Preliminary result)   Collection Time: 07/03/23  9:56 AM   Specimen: BLOOD LEFT ARM  Result Value  Ref Range Status   Specimen Description BLOOD LEFT ARM  Final   Special Requests   Final    BOTTLES DRAWN AEROBIC AND ANAEROBIC Blood Culture adequate volume   Culture   Final    NO GROWTH 2 DAYS Performed at [facility], [address] [place], [place] [address]    Report Status PENDING  Incomplete  Culture, blood (Routine X 2) w Reflex to ID Panel     Status: None (Preliminary result)   Collection Time: 07/03/23  9:56 AM   Specimen: BLOOD LEFT HAND  Result Value Ref Range Status   Specimen Description BLOOD LEFT HAND  Final   Special Requests   Final    BOTTLES DRAWN AEROBIC ONLY Blood Culture adequate volume   Culture   Final    NO GROWTH 2 DAYS Performed at [facility], [address], [place], [place] [address]    Report Status PENDING  Incomplete  Urine Culture     Status: Abnormal   Collection Time: 07/03/23 11:03 AM   Specimen: Urine, Random  Result Value Ref Range Status   Specimen Description   Final  URINE, RANDOM Performed at [facility], [address] [place], [place] [address]    Special Requests   Final    NONE Reflexed from [id number] Performed at [facility], [address] [place], [place] [address]    Culture (A)  Final    <10,000 COLONIES/mL INSIGNIFICANT GROWTH Performed at [facility], [address], [place], [place] [address]    Report Status 07/04/2023 FINAL  Final  C Difficile Quick Screen w PCR reflex     Status: None   Collection Time: 07/03/23  2:15 PM   Specimen: STOOL  Result Value Ref Range Status   C Diff antigen NEGATIVE NEGATIVE Final   C Diff toxin NEGATIVE NEGATIVE Final   C Diff interpretation No C. difficile detected.  Final    Comment: Performed at [facility], [address] [place] [address] [place], [place] [address]    Labs: CBC: Recent Labs  Lab 07/01/23 1439 07/01/23 2037 07/04/23 0449 07/05/23 0627  WBC 12.7* 11.3* 9.5 10.1  HGB 14.1 11.1* 8.8* 8.7*  HCT 40.6 30.5*  24.8* 24.6*  MCV 88.5 88.2 89.2 88.8  PLT 340 237 220 268   Basic Metabolic Panel: Recent Labs  Lab 07/01/23 2037 07/02/23 1543 07/02/23 1951 07/03/23 0457 07/03/23 1609 07/04/23 0449 07/05/23 0627  NA 130* 139 131*  130* 132* 134* 132* 136  K 2.9* 4.6 3.7 2.8* 4.1 3.9 3.5  CL 98 107 103 104 109 108 109  CO2 14* 26 18* 20* 18* 18* 20*  GLUCOSE 84 91 96 90 146* 131* 116*  BUN 22 43* 17 13 9 8 9   CREATININE 1.78* 1.64* 0.89 0.71 0.77 0.69 0.78  CALCIUM  7.7* 8.7* 7.4* 7.6* 6.6* 7.3* 7.7*  MG 0.9* 2.1  --  1.8  --  1.3* 2.0  PHOS 3.7  --   --  2.0*  --  2.6 2.5   Liver Function Tests: No results for input(s): "AST", "ALT", "ALKPHOS", "BILITOT", "PROT", "ALBUMIN " in the last 168 hours. CBG: No results for input(s): "GLUCAP" in the last 168 hours.  Discharge time spent: greater than 30 minutes.  Signed: Sheril Dines, MD [facility] 07/05/2023

## 2023-07-05 NOTE — Progress Notes (Signed)
  Chaplain On-Call responded to Spiritual Care Consult Orders from Donaciano Frizzle, MD and Joane Mower, MD.  The Order request noted "major life transition" for the patient.  At 1115 hours, the patient is sleeping soundly. Charge Nurse advised a visit for 1300 hours.  This Chaplain will refer to the Afternoon Chaplain for follow-up and completion of the [facility] Consult Order.  Chaplain Dean Every., [facility]

## 2023-07-05 NOTE — Progress Notes (Signed)
   07/05/23 1445  Spiritual Encounters  Type of Visit Follow up  Care provided to: Pt not available  Conversation partners present during encounter Nurse  Reason for visit Routine spiritual support  OnCall Visit No   Chaplain followed up with patient because of Consult that was in the [facility] system.  Chaplain met with patient yesterday but patient was working with the Nurse.  Chaplain wasn't able to return to the patient and referred the visit to colleague who wasn't able to visit.  Chaplain followed up with patient today but Nurse said she'd been discharged.    Rev. Rana M. Nolon Baxter, M.Div. Chaplain Resident  [facility]

## 2023-07-05 NOTE — TOC Progression Note (Signed)
 Transition of Care [facility]) - Progression Note    Patient Details  Name: REONA ZENDEJAS MRN: [id number] Date of Birth: 06/28/1960  [facility] CM/SW Contact  Elsie Halo, RN Phone Number: 07/05/2023, 10:48 AM  Clinical Narrative:     Patient has surgery scheduled for 5:00 AM on 07/07/23 at [facility] in [place] and needs transportation to the appointment. TOC called [employer] at [phone] and setup transportation for the patient. Transportation will arrive at her home at approximately 3:55 AM to transport her to [employer]. The reference number for the call is [phone].  TOC will continue to follow.       Expected Discharge Plan and Services                                               Social Determinants of Health (SDOH) Interventions SDOH Screenings   Food Insecurity: No Food Insecurity (07/01/2023)  Housing: Low Risk  (07/02/2023)  Transportation Needs: No Transportation Needs (07/01/2023)  Utilities: Not At Risk (07/01/2023)  Alcohol Screen: Low Risk  (01/06/2022)  Depression (PHQ2-9): Medium Risk (01/06/2022)  Financial Resource Strain: Low Risk  (01/06/2022)  Physical Activity: Inactive (01/06/2022)  Social Connections: Moderately Isolated (07/01/2023)  Stress: Stress Concern Present (01/06/2022)  Tobacco Use: High Risk (07/01/2023)    Readmission Risk Interventions    04/23/2023    1:26 PM  Readmission Risk Prevention Plan  Transportation Screening Complete  PCP or Specialist Appt within 5-7 Days Complete  Home Care Screening Complete  Medication Review (RN CM) Complete

## 2023-07-06 ENCOUNTER — Other Ambulatory Visit (HOSPITAL_COMMUNITY): Payer: Self-pay

## 2023-07-06 NOTE — Anesthesia Preprocedure Evaluation (Signed)
 Anesthesia Evaluation  Patient identified by MRN, date of birth, ID band Patient awake    Reviewed: Allergy & Precautions, NPO status , Patient's Chart, lab work & pertinent test results  History of Anesthesia Complications Negative for: history of anesthetic complications  Airway Mallampati: III  TM Distance: >3 FB Neck ROM: Full    Dental no notable dental hx. (+) Missing, Poor Dentition, Dental Advisory Given   Pulmonary COPD, Current Smoker and Patient abstained from smoking. Lung ca   Pulmonary exam normal breath sounds clear to auscultation       Cardiovascular hypertension, +CHF  Normal cardiovascular exam Rhythm:Regular Rate:Normal     Neuro/Psych    GI/Hepatic ,GERD  Medicated and Controlled,,Rectal CA   Endo/Other  diabetes    Renal/GU Lab Results      Component                Value               Date                        K                        3.5                 07/05/2023              CREATININE               0.78                07/05/2023                            Musculoskeletal   Abdominal   Peds  Hematology  (+) Blood dyscrasia, anemia Onlovenox  Lab Results      Component                Value               Date                      WBC                      10.1                07/05/2023                HGB                      8.7 (L)             07/05/2023                HCT                      24.6 (L)            07/05/2023                MCV                      88.8                07/05/2023                PLT  268                 07/05/2023              Anesthesia Other Findings R breast ca  Reproductive/Obstetrics                             Anesthesia Physical Anesthesia Plan  ASA: 3  Anesthesia Plan: General   Post-op Pain Management: Ketamine  IV*, Tylenol  PO (pre-op)* and Toradol  IV (intra-op)*   Induction:  Intravenous  PONV Risk Score and Plan: Treatment may vary due to age or medical condition, Midazolam  and Ondansetron   Airway Management Planned: Oral ETT  Additional Equipment: None  Intra-op Plan:   Post-operative Plan: Extubation in OR  Informed Consent:      Dental advisory given  Plan Discussed with: CRNA and Surgeon  Anesthesia Plan Comments:        Anesthesia Quick Evaluation

## 2023-07-07 ENCOUNTER — Other Ambulatory Visit: Payer: Self-pay

## 2023-07-07 ENCOUNTER — Inpatient Hospital Stay (HOSPITAL_COMMUNITY): Payer: Self-pay | Admitting: Anesthesiology

## 2023-07-07 ENCOUNTER — Other Ambulatory Visit (HOSPITAL_COMMUNITY): Payer: Self-pay

## 2023-07-07 ENCOUNTER — Encounter (HOSPITAL_COMMUNITY)
Admission: RE | Disposition: A | Discharge status: Home / Self Care | Payer: Self-pay | Source: Home / Self Care | Attending: Surgery

## 2023-07-07 ENCOUNTER — Inpatient Hospital Stay (HOSPITAL_COMMUNITY)
Admission: RE | Admit: 2023-07-07 | Discharge: 2023-07-12 | DRG: 330 | Disposition: A | Discharge status: Home / Self Care | Attending: Surgery | Admitting: Surgery

## 2023-07-07 ENCOUNTER — Encounter (HOSPITAL_COMMUNITY): Payer: Self-pay | Admitting: Surgery

## 2023-07-07 DIAGNOSIS — E860 Dehydration: Secondary | ICD-10-CM | POA: Diagnosis present

## 2023-07-07 DIAGNOSIS — Z853 Personal history of malignant neoplasm of breast: Secondary | ICD-10-CM

## 2023-07-07 DIAGNOSIS — Z923 Personal history of irradiation: Secondary | ICD-10-CM

## 2023-07-07 DIAGNOSIS — I700 Atherosclerosis of aorta: Secondary | ICD-10-CM | POA: Diagnosis present

## 2023-07-07 DIAGNOSIS — Z803 Family history of malignant neoplasm of breast: Secondary | ICD-10-CM | POA: Diagnosis not present

## 2023-07-07 DIAGNOSIS — Z79899 Other long term (current) drug therapy: Secondary | ICD-10-CM

## 2023-07-07 DIAGNOSIS — Z9221 Personal history of antineoplastic chemotherapy: Secondary | ICD-10-CM

## 2023-07-07 DIAGNOSIS — I2510 Atherosclerotic heart disease of native coronary artery without angina pectoris: Secondary | ICD-10-CM | POA: Diagnosis present

## 2023-07-07 DIAGNOSIS — Z9049 Acquired absence of other specified parts of digestive tract: Secondary | ICD-10-CM

## 2023-07-07 DIAGNOSIS — Z9011 Acquired absence of right breast and nipple: Secondary | ICD-10-CM | POA: Diagnosis not present

## 2023-07-07 DIAGNOSIS — J4489 Other specified chronic obstructive pulmonary disease: Secondary | ICD-10-CM | POA: Diagnosis present

## 2023-07-07 DIAGNOSIS — F17210 Nicotine dependence, cigarettes, uncomplicated: Secondary | ICD-10-CM | POA: Diagnosis present

## 2023-07-07 DIAGNOSIS — Z932 Ileostomy status: Secondary | ICD-10-CM | POA: Diagnosis not present

## 2023-07-07 DIAGNOSIS — Z85048 Personal history of other malignant neoplasm of rectum, rectosigmoid junction, and anus: Secondary | ICD-10-CM | POA: Diagnosis not present

## 2023-07-07 DIAGNOSIS — C3491 Malignant neoplasm of unspecified part of right bronchus or lung: Secondary | ICD-10-CM | POA: Diagnosis present

## 2023-07-07 DIAGNOSIS — Z7951 Long term (current) use of inhaled steroids: Secondary | ICD-10-CM | POA: Diagnosis not present

## 2023-07-07 DIAGNOSIS — E782 Mixed hyperlipidemia: Secondary | ICD-10-CM | POA: Diagnosis present

## 2023-07-07 DIAGNOSIS — Z808 Family history of malignant neoplasm of other organs or systems: Secondary | ICD-10-CM | POA: Diagnosis not present

## 2023-07-07 DIAGNOSIS — E876 Hypokalemia: Secondary | ICD-10-CM | POA: Diagnosis present

## 2023-07-07 DIAGNOSIS — K219 Gastro-esophageal reflux disease without esophagitis: Secondary | ICD-10-CM | POA: Diagnosis present

## 2023-07-07 DIAGNOSIS — Z432 Encounter for attention to ileostomy: Secondary | ICD-10-CM | POA: Diagnosis not present

## 2023-07-07 DIAGNOSIS — C20 Malignant neoplasm of rectum: Secondary | ICD-10-CM | POA: Diagnosis present

## 2023-07-07 DIAGNOSIS — Z85118 Personal history of other malignant neoplasm of bronchus and lung: Secondary | ICD-10-CM

## 2023-07-07 DIAGNOSIS — Z9889 Other specified postprocedural states: Principal | ICD-10-CM | POA: Diagnosis present

## 2023-07-07 HISTORY — PX: COLOSTOMY TAKEDOWN: SHX5258

## 2023-07-07 HISTORY — PX: FLEXIBLE SIGMOIDOSCOPY: SHX5431

## 2023-07-07 LAB — TYPE AND SCREEN
ABO/RH(D): O POS
Antibody Screen: NEGATIVE

## 2023-07-07 SURGERY — CLOSURE, COLOSTOMY, LAPAROSCOPIC
Anesthesia: General | Site: Abdomen

## 2023-07-07 MED ORDER — FENTANYL CITRATE (PF) 100 MCG/2ML IJ SOLN
INTRAMUSCULAR | Status: DC | PRN
  Administered 2023-07-07: 50 ug via INTRAVENOUS
  Administered 2023-07-07 (×2): 25 ug via INTRAVENOUS

## 2023-07-07 MED ORDER — PROPOFOL 10 MG/ML IV BOLUS
INTRAVENOUS | Status: AC
  Filled 2023-07-07: qty 20

## 2023-07-07 MED ORDER — DIPHENHYDRAMINE HCL 50 MG/ML IJ SOLN: 12.5000 mg | Freq: Four times a day (QID) | INTRAMUSCULAR | Status: DC | PRN

## 2023-07-07 MED ORDER — IBUPROFEN 400 MG PO TABS
600.0000 mg | ORAL_TABLET | Freq: Four times a day (QID) | ORAL | Status: DC | PRN
  Administered 2023-07-08: 600 mg via ORAL
  Filled 2023-07-07: qty 1

## 2023-07-07 MED ORDER — HYDROMORPHONE HCL 1 MG/ML IJ SOLN
0.2500 mg | INTRAMUSCULAR | Status: DC | PRN
  Administered 2023-07-07: 0.25 mg via INTRAVENOUS

## 2023-07-07 MED ORDER — PROCHLORPERAZINE EDISYLATE 10 MG/2ML IJ SOLN
10.0000 mg | Freq: Four times a day (QID) | INTRAMUSCULAR | Status: DC | PRN
  Administered 2023-07-07 – 2023-07-11 (×2): 10 mg via INTRAVENOUS
  Filled 2023-07-07 (×2): qty 2

## 2023-07-07 MED ORDER — OXYCODONE HCL 5 MG/5ML PO SOLN: 5.0000 mg | Freq: Once | ORAL | Status: DC | PRN

## 2023-07-07 MED ORDER — OXYCODONE HCL 5 MG PO TABS
5.0000 mg | ORAL_TABLET | ORAL | Status: DC | PRN
  Administered 2023-07-07 – 2023-07-11 (×6): 5 mg via ORAL
  Filled 2023-07-07 (×6): qty 1

## 2023-07-07 MED ORDER — PANTOPRAZOLE SODIUM 40 MG PO TBEC
40.0000 mg | DELAYED_RELEASE_TABLET | Freq: Every day | ORAL | Status: DC
  Administered 2023-07-07 – 2023-07-12 (×6): 40 mg via ORAL
  Filled 2023-07-07 (×6): qty 1

## 2023-07-07 MED ORDER — LACTATED RINGERS IV SOLN: INTRAVENOUS | Status: DC

## 2023-07-07 MED ORDER — ALVIMOPAN 12 MG PO CAPS
12.0000 mg | ORAL_CAPSULE | Freq: Two times a day (BID) | ORAL | Status: DC
  Filled 2023-07-07: qty 1

## 2023-07-07 MED ORDER — HEPARIN SODIUM (PORCINE) 5000 UNIT/ML IJ SOLN
5000.0000 [IU] | Freq: Once | INTRAMUSCULAR | Status: AC
  Administered 2023-07-07: 5000 [IU] via SUBCUTANEOUS
  Filled 2023-07-07: qty 1

## 2023-07-07 MED ORDER — HYDROMORPHONE HCL 1 MG/ML IJ SOLN
0.5000 mg | INTRAMUSCULAR | Status: DC | PRN
  Administered 2023-07-07 – 2023-07-10 (×3): 0.5 mg via INTRAVENOUS
  Filled 2023-07-07 (×3): qty 0.5

## 2023-07-07 MED ORDER — KETOROLAC TROMETHAMINE 30 MG/ML IJ SOLN: 30.0000 mg | Freq: Once | INTRAMUSCULAR | Status: AC | PRN

## 2023-07-07 MED ORDER — KETAMINE HCL 50 MG/5ML IJ SOSY
PREFILLED_SYRINGE | INTRAMUSCULAR | Status: AC
  Filled 2023-07-07: qty 5

## 2023-07-07 MED ORDER — DEXAMETHASONE SODIUM PHOSPHATE 10 MG/ML IJ SOLN
INTRAMUSCULAR | Status: AC
  Filled 2023-07-07: qty 1

## 2023-07-07 MED ORDER — LIDOCAINE HCL (PF) 2 % IJ SOLN
INTRAMUSCULAR | Status: AC
  Filled 2023-07-07: qty 5

## 2023-07-07 MED ORDER — BUPIVACAINE LIPOSOME 1.3 % IJ SUSP
INTRAMUSCULAR | Status: AC
  Filled 2023-07-07: qty 20

## 2023-07-07 MED ORDER — FENTANYL CITRATE (PF) 100 MCG/2ML IJ SOLN
INTRAMUSCULAR | Status: AC
  Filled 2023-07-07: qty 2

## 2023-07-07 MED ORDER — OXYCODONE HCL 5 MG PO TABS: 5.0000 mg | ORAL_TABLET | Freq: Once | ORAL | Status: DC | PRN

## 2023-07-07 MED ORDER — BUPIVACAINE-EPINEPHRINE (PF) 0.25% -1:200000 IJ SOLN
INTRAMUSCULAR | Status: AC
  Filled 2023-07-07: qty 30

## 2023-07-07 MED ORDER — KETAMINE HCL 50 MG/5ML IJ SOSY
PREFILLED_SYRINGE | INTRAMUSCULAR | Status: DC | PRN
  Administered 2023-07-07 (×4): 10 mg via INTRAVENOUS

## 2023-07-07 MED ORDER — ACETAMINOPHEN 500 MG PO TABS
1000.0000 mg | ORAL_TABLET | Freq: Four times a day (QID) | ORAL | Status: DC
  Administered 2023-07-07 – 2023-07-12 (×18): 1000 mg via ORAL
  Filled 2023-07-07 (×18): qty 2

## 2023-07-07 MED ORDER — ONDANSETRON HCL 4 MG/2ML IJ SOLN
INTRAMUSCULAR | Status: DC | PRN
  Administered 2023-07-07: 4 mg via INTRAVENOUS

## 2023-07-07 MED ORDER — ENSURE SURGERY PO LIQD
237.0000 mL | Freq: Two times a day (BID) | ORAL | Status: DC
  Administered 2023-07-07 – 2023-07-11 (×3): 237 mL via ORAL

## 2023-07-07 MED ORDER — ONDANSETRON HCL 4 MG/2ML IJ SOLN
INTRAMUSCULAR | Status: AC
  Filled 2023-07-07: qty 2

## 2023-07-07 MED ORDER — HEPARIN SODIUM (PORCINE) 5000 UNIT/ML IJ SOLN
5000.0000 [IU] | Freq: Once | INTRAMUSCULAR | Status: DC
Start: 2023-07-07 — End: 2023-07-07

## 2023-07-07 MED ORDER — PROPOFOL 10 MG/ML IV BOLUS
INTRAVENOUS | Status: DC | PRN
  Administered 2023-07-07: 120 mg via INTRAVENOUS

## 2023-07-07 MED ORDER — ROCURONIUM BROMIDE 10 MG/ML (PF) SYRINGE
PREFILLED_SYRINGE | INTRAVENOUS | Status: DC | PRN
  Administered 2023-07-07: 40 mg via INTRAVENOUS

## 2023-07-07 MED ORDER — MAGNESIUM OXIDE -MG SUPPLEMENT 400 (240 MG) MG PO TABS
800.0000 mg | ORAL_TABLET | Freq: Every day | ORAL | Status: DC
  Administered 2023-07-07 – 2023-07-12 (×6): 800 mg via ORAL
  Filled 2023-07-07 (×6): qty 2

## 2023-07-07 MED ORDER — 0.9 % SODIUM CHLORIDE (POUR BTL) OPTIME
TOPICAL | Status: DC | PRN
  Administered 2023-07-07: 2000 mL

## 2023-07-07 MED ORDER — ACETAMINOPHEN 500 MG PO TABS: 1000.0000 mg | ORAL_TABLET | ORAL | Status: DC

## 2023-07-07 MED ORDER — DIPHENHYDRAMINE HCL 12.5 MG/5ML PO ELIX: 12.5000 mg | ORAL_SOLUTION | Freq: Four times a day (QID) | ORAL | Status: DC | PRN

## 2023-07-07 MED ORDER — BUPIVACAINE-EPINEPHRINE (PF) 0.25% -1:200000 IJ SOLN
INTRAMUSCULAR | Status: DC | PRN
  Administered 2023-07-07: 50 mL

## 2023-07-07 MED ORDER — ALVIMOPAN 12 MG PO CAPS
12.0000 mg | ORAL_CAPSULE | ORAL | Status: AC
  Administered 2023-07-07: 12 mg via ORAL
  Filled 2023-07-07: qty 1

## 2023-07-07 MED ORDER — ALBUTEROL SULFATE (2.5 MG/3ML) 0.083% IN NEBU: 2.5000 mg | INHALATION_SOLUTION | Freq: Four times a day (QID) | RESPIRATORY_TRACT | Status: DC | PRN

## 2023-07-07 MED ORDER — MIDAZOLAM HCL 5 MG/5ML IJ SOLN
INTRAMUSCULAR | Status: DC | PRN
  Administered 2023-07-07: 2 mg via INTRAVENOUS

## 2023-07-07 MED ORDER — ROCURONIUM BROMIDE 10 MG/ML (PF) SYRINGE
PREFILLED_SYRINGE | INTRAVENOUS | Status: AC
  Filled 2023-07-07: qty 10

## 2023-07-07 MED ORDER — SODIUM CHLORIDE 0.9 % IV SOLN
2.0000 g | Freq: Once | INTRAVENOUS | Status: AC
  Administered 2023-07-07: 2 g via INTRAVENOUS
  Filled 2023-07-07: qty 2

## 2023-07-07 MED ORDER — FLUTICASONE FUROATE-VILANTEROL 200-25 MCG/ACT IN AEPB
1.0000 | INHALATION_SPRAY | Freq: Every day | RESPIRATORY_TRACT | Status: DC
  Administered 2023-07-08 – 2023-07-12 (×5): 1 via RESPIRATORY_TRACT
  Filled 2023-07-07: qty 28

## 2023-07-07 MED ORDER — SIMETHICONE 80 MG PO CHEW: 40.0000 mg | CHEWABLE_TABLET | Freq: Four times a day (QID) | ORAL | Status: DC | PRN

## 2023-07-07 MED ORDER — SODIUM CHLORIDE 0.9 % IV SOLN: 2.0000 g | INTRAVENOUS | Status: DC

## 2023-07-07 MED ORDER — ALVIMOPAN 12 MG PO CAPS
12.0000 mg | ORAL_CAPSULE | ORAL | Status: DC
Start: 2023-07-07 — End: 2023-07-07

## 2023-07-07 MED ORDER — BUPIVACAINE LIPOSOME 1.3 % IJ SUSP: 20.0000 mL | Freq: Once | INTRAMUSCULAR | Status: DC

## 2023-07-07 MED ORDER — MIDAZOLAM HCL 2 MG/2ML IJ SOLN
INTRAMUSCULAR | Status: AC
Start: 2023-07-07 — End: ?
  Filled 2023-07-07: qty 2

## 2023-07-07 MED ORDER — HYDROMORPHONE HCL 1 MG/ML IJ SOLN
INTRAMUSCULAR | Status: AC
  Filled 2023-07-07: qty 1

## 2023-07-07 MED ORDER — FLUTICASONE PROPIONATE 50 MCG/ACT NA SUSP
1.0000 | Freq: Every day | NASAL | Status: DC | PRN
  Filled 2023-07-07: qty 16

## 2023-07-07 MED ORDER — HYDRALAZINE HCL 20 MG/ML IJ SOLN: 10.0000 mg | INTRAMUSCULAR | Status: DC | PRN

## 2023-07-07 MED ORDER — ALUM & MAG HYDROXIDE-SIMETH 200-200-20 MG/5ML PO SUSP
30.0000 mL | Freq: Four times a day (QID) | ORAL | Status: DC | PRN
  Administered 2023-07-09: 30 mL via ORAL
  Filled 2023-07-07: qty 30

## 2023-07-07 MED ORDER — HEPARIN SODIUM (PORCINE) 5000 UNIT/ML IJ SOLN
5000.0000 [IU] | Freq: Three times a day (TID) | INTRAMUSCULAR | Status: DC
  Administered 2023-07-07 – 2023-07-12 (×15): 5000 [IU] via SUBCUTANEOUS
  Filled 2023-07-07 (×15): qty 1

## 2023-07-07 MED ORDER — DEXAMETHASONE SODIUM PHOSPHATE 10 MG/ML IJ SOLN
INTRAMUSCULAR | Status: DC | PRN
  Administered 2023-07-07: 8 mg via INTRAVENOUS

## 2023-07-07 MED ORDER — SUGAMMADEX SODIUM 200 MG/2ML IV SOLN
INTRAVENOUS | Status: DC | PRN
Start: 2023-07-07 — End: 2023-07-07
  Administered 2023-07-07: 80 mg via INTRAVENOUS
  Administered 2023-07-07: 120 mg via INTRAVENOUS

## 2023-07-07 MED ORDER — KETOROLAC TROMETHAMINE 30 MG/ML IJ SOLN
INTRAMUSCULAR | Status: AC
  Administered 2023-07-07: 30 mg via INTRAVENOUS
  Filled 2023-07-07: qty 1

## 2023-07-07 MED ORDER — LIDOCAINE HCL (CARDIAC) PF 100 MG/5ML IV SOSY
PREFILLED_SYRINGE | INTRAVENOUS | Status: DC | PRN
Start: 2023-07-07 — End: 2023-07-07
  Administered 2023-07-07: 60 mg via INTRAVENOUS

## 2023-07-07 MED ORDER — OXYCODONE HCL 5 MG PO TABS
5.0000 mg | ORAL_TABLET | Freq: Four times a day (QID) | ORAL | 0 refills | Status: AC | PRN
  Filled 2023-07-07: qty 20, 5d supply, fill #0

## 2023-07-07 SURGICAL SUPPLY — 62 items
BAG COUNTER SPONGE SURGICOUNT (BAG) IMPLANT
BLADE EXTENDED COATED 6.5IN (ELECTRODE) IMPLANT
CABLE HIGH FREQUENCY MONO STRZ (ELECTRODE) IMPLANT
CATH MUSHROOM 30FR (CATHETERS) IMPLANT
CELLS DAT CNTRL 66122 CELL SVR (MISCELLANEOUS) IMPLANT
CLIP APPLIE 5 13 M/L LIGAMAX5 (MISCELLANEOUS) IMPLANT
CLIP APPLIE ROT 10 11.4 M/L (STAPLE) IMPLANT
DERMABOND ADVANCED .7 DNX12 (GAUZE/BANDAGES/DRESSINGS) IMPLANT
DISSECTOR BLUNT TIP ENDO 5MM (MISCELLANEOUS) IMPLANT
DRAIN CHANNEL 19F RND (DRAIN) IMPLANT
DRAPE SURG IRRIG POUCH 19X23 (DRAPES) ×2 IMPLANT
DRSG OPSITE POSTOP 4X10 (GAUZE/BANDAGES/DRESSINGS) IMPLANT
DRSG OPSITE POSTOP 4X6 (GAUZE/BANDAGES/DRESSINGS) IMPLANT
DRSG OPSITE POSTOP 4X8 (GAUZE/BANDAGES/DRESSINGS) IMPLANT
ELECT REM PT RETURN 15FT ADLT (MISCELLANEOUS) ×2 IMPLANT
EVACUATOR SILICONE 100CC (DRAIN) IMPLANT
GAUZE PAD ABD 8X10 STRL (GAUZE/BANDAGES/DRESSINGS) IMPLANT
GAUZE SPONGE 4X4 12PLY STRL (GAUZE/BANDAGES/DRESSINGS) IMPLANT
GLOVE BIO SURGEON STRL SZ7.5 (GLOVE) ×4 IMPLANT
GLOVE INDICATOR 8.0 STRL GRN (GLOVE) ×4 IMPLANT
GOWN STRL REUS W/ TWL XL LVL3 (GOWN DISPOSABLE) ×8 IMPLANT
HOLDER FOLEY CATH W/STRAP (MISCELLANEOUS) ×2 IMPLANT
IRRIGATION SUCT STRKRFLW 2 WTP (MISCELLANEOUS) ×2 IMPLANT
KIT TURNOVER KIT A (KITS) IMPLANT
LIGASURE IMPACT 36 18CM CVD LR (INSTRUMENTS) IMPLANT
NDL INSUFFLATION 14GA 120MM (NEEDLE) IMPLANT
NEEDLE INSUFFLATION 14GA 120MM (NEEDLE) IMPLANT
PACK COLON (CUSTOM PROCEDURE TRAY) ×2 IMPLANT
PAD POSITIONING PINK XL (MISCELLANEOUS) ×2 IMPLANT
PENCIL SMOKE EVACUATOR (MISCELLANEOUS) IMPLANT
RELOAD PROXIMATE 75MM BLUE (ENDOMECHANICALS) IMPLANT
RELOAD STAPLE 75 3.8 BLU REG (ENDOMECHANICALS) IMPLANT
RETRACTOR WND ALEXIS 18 MED (MISCELLANEOUS) IMPLANT
SCISSORS LAP 5X35 DISP (ENDOMECHANICALS) ×2 IMPLANT
SEALER TISSUE G2 STRG ARTC 35C (ENDOMECHANICALS) ×2 IMPLANT
SET TUBE SMOKE EVAC HIGH FLOW (TUBING) ×2 IMPLANT
SLEEVE ADV FIXATION 5X100MM (TROCAR) ×4 IMPLANT
SPIKE FLUID TRANSFER (MISCELLANEOUS) ×2 IMPLANT
SPONGE DRAIN TRACH 4X4 STRL 2S (GAUZE/BANDAGES/DRESSINGS) IMPLANT
STAPLER GUN LINEAR PROX 60 (STAPLE) IMPLANT
STAPLER PROXIMATE 75MM BLUE (STAPLE) IMPLANT
STAPLER SKIN PROX WIDE 3.9 (STAPLE) IMPLANT
SUT ETHILON 3 0 PS 1 (SUTURE) IMPLANT
SUT PDS AB 1 TP1 54 (SUTURE) IMPLANT
SUT PDS AB 1 TP1 96 (SUTURE) IMPLANT
SUT PROLENE 2 0 KS (SUTURE) ×2 IMPLANT
SUT PROLENE 2 0 SH DA (SUTURE) ×2 IMPLANT
SUT SILK 2 0 SH CR/8 (SUTURE) ×2 IMPLANT
SUT SILK 2-0 18XBRD TIE 12 (SUTURE) ×2 IMPLANT
SUT SILK 3 0 SH CR/8 (SUTURE) ×2 IMPLANT
SUT SILK 3-0 18XBRD TIE 12 (SUTURE) ×2 IMPLANT
SUT VIC AB 2-0 SH 27X BRD (SUTURE) IMPLANT
SUT VIC AB 3-0 SH 18 (SUTURE) IMPLANT
SUT VIC AB 3-0 SH 27X BRD (SUTURE) IMPLANT
SUT VICRYL AB 2 0 TIES (SUTURE) ×2 IMPLANT
SYSTEM LAPSCP GELPORT 120MM (MISCELLANEOUS) IMPLANT
SYSTEM WOUND ALEXIS 18CM MED (MISCELLANEOUS) IMPLANT
TAPE CLOTH 4X10 WHT NS (GAUZE/BANDAGES/DRESSINGS) IMPLANT
TOWEL OR 17X26 10 PK STRL BLUE (TOWEL DISPOSABLE) IMPLANT
TRAY IRRIG W/60CC SYR STRL (SET/KITS/TRAYS/PACK) ×2 IMPLANT
TROCAR ADV FIXATION 5X100MM (TROCAR) ×2 IMPLANT
TROCAR BALLN 12MMX100 BLUNT (TROCAR) IMPLANT

## 2023-07-07 NOTE — Plan of Care (Signed)
  Problem: Education: Goal: Understanding of discharge needs will improve Outcome: Progressing Goal: Verbalization of understanding of the causes of altered bowel function will improve Outcome: Progressing   Problem: Activity: Goal: Ability to tolerate increased activity will improve Outcome: Progressing   Problem: Bowel/Gastric: Goal: Gastrointestinal status for postoperative course will improve Outcome: Progressing

## 2023-07-07 NOTE — H&P (Signed)
 CC: Here today for surgery  HPI: Yvonne Scott is an 63 y.o. female with history of breast cancer, SCC lung cancer, asthma whom is seen in the office today as a referral by Dr. Gwen Lek for evaluation of rectal cancer.   Colonoscopy with Dr. Arlyne Lame 12/07/2022: - Greater than 5 cm polyp in the mid rectum. Resection not attempted. Biopsy. Tattoo. - Distal rectum and anal verge normal - Exam otherwise normal - Examined portion of ileum normal  PATH 1. Rectum, polyp(s), CBX :  - AT LEAST INTRAMUCOSAL CARCINOMA (MULTIPLE FRAGMENTS)  - SEE NOTE   Did have CT CAP 05/2022 IMPRESSION: 1. Interval right mastectomy with resolution of suspicious right axillary nodes. 2. Enlargement of a 5 mm right lower lobe pulmonary nodule. Development of a 5 mm left upper lobe pulmonary nodule. Cannot exclude early metastatic disease. Consider chest CT follow-up at 3 months. 3. Otherwise, no evidence of metastatic disease in the chest, abdomen, or pelvis. 4. Age advanced coronary artery atherosclerosis. Recommend assessment of coronary risk factors. 5. Aortic atherosclerosis (ICD10-I70.0) and emphysema (ICD10-J43.9). 6. Mild enlargement of the common duct, which is now minimally dilated for age. Correlate with bilirubin levels. If elevated, consider ERCP.  CT Chest 09/2022 1. Interval growth of three solid bilateral pulmonary nodules since 05/25/2022 chest CT, largest 0.9 cm in the anteromedial left upper lobe, highly suspicious for pulmonary metastases. 2. No thoracic adenopathy. 3. Three-vessel coronary atherosclerosis. 4. Aortic Atherosclerosis (ICD10-I70.0) and Emphysema (ICD10-J43.9).  CT PET 09/26/22 1. Hypermetabolic bilateral pulmonary nodules, consistent with metastasis. 2. No other evidence of tracer avid metastatic disease, status post right mastectomy. 3. Hypermetabolism with suggestion of soft tissue fullness in the mid rectum, suspicious for polyp or carcinoma. Consider  further evaluation with sigmoidoscopy. 4. Incidental findings, including: Aortic atherosclerosis (ICD10-I70.0), coronary artery atherosclerosis and emphysema (ICD10-J43.9).  She currently has triple negative breast cancer but has completed all treatments per Dr. Wilhelmenia Harada. She has recently been diagnosed with an early stage lung squamous cell carcinoma and is getting ready to start lung SBRT.  Genetics: - Underwent genetic testing 01/24/2023 which demonstrated a variant of uncertain significance - [id number]  Currently, denies any symptoms related to the apparent rectal mass. She denies any diarrhea/constipation. She has occasionally had some loose stool particularly on chemo. Denies any blood in her stool. Denies any significant abdominal pain, cramping, bloating.  INTERVAL HX OR 04/21/23 Robotic assisted low anterior resection with diverting loop ileostomy Diagnostic flexible sigmoidoscopy-necessary to confirm location of mass Intraoperative assessment of perfusion using ICG fluorescence imaging Bilateral transversus abdominus plane (TAP) blocks  FINDINGS: Normal peritoneal surfaces. Normal surface of liver. Tattoo involving much of the lower rectum. No full-thickness extension of any sort of mass to the wall of the rectum. Flexible sigmoidoscopy was utilized to confirm the exact location of the lesion to ensure we had an appropriate distal margin below the tumor. A well perfused, tension free, hemostatic, air tight 29 mm EEA colorectal anastomosis fashioned 3-4 cm from the anal verge by flexible sigmoidoscopy.   A. DISTAL SIGMOID COLON AND RECTUM, LOW ANTERIOR RESECTION:  Tubulovillous adenoma with high-grade dysplasia/intramucosal carcinoma  (pTis)  Negative for invasion  Tumor measures 2.6 x 2.3 x 2.1 cm  Margins free of adenomatous change and carcinoma  Eight benign perirectal lymph nodes (0/8, pN0)   B. FINAL DISTAL MARGIN:  Benign colon  Negative for dysplasia and carcinoma   She has  been doing reasonably well. She is here today with her mother. She does report  she has been drinking not quite enough fluids by mouth and recognizes some degree of dehydration. She did take some Pedialyte yesterday and plans to take more today. She did have some nausea previously but has resolved. Denies any nausea or vomiting at present. Denies abdominal pain. Her ostomy has been working well. She is taking FiberCon and reports with that, her stools are typically semiformed from her ileostomy.  PMH: GERD, asthma, breast cancer (completed treatment per Dr. Wilhelmenia Harada), early stage [facility] of lung per Dr. Wilhelmenia Harada - undergoing SBRT  PSH: She denies ever having had any abdominal or pelvic surgical procedures.  FHx: Denies any known family history of colorectal, breast, endometrial or ovarian cancer  Social Hx: Currently smokes working on quitting-states she is only having 1 maybe 2 cigarettes a day. Denies use of EtOH/drugs. She is here today with her mother. 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   She denies any changes in health or health history since we met in the office aside from some apparent dehydration and a UTI necessitating treatment. No new medications/allergies. She states she is ready for surgery today.  Past Medical History:  Diagnosis Date   Anxiety    Aortic atherosclerosis (HCC) 10/24/2016   Breast cancer (HCC)    colon cancer  and rectal   COPD (chronic obstructive pulmonary disease) (HCC)    Depression    Dyspnea    GERD (gastroesophageal reflux disease)    Hypokalemia    Hypomagnesemia    Invasive ductal carcinoma of right breast (HCC)    Metabolic acidosis    Personal history of chemotherapy    Pneumonia    Severe protein-calorie malnutrition (HCC)    Thrombocytopenia (HCC)     Past Surgical History:  Procedure Laterality Date   BREAST BIOPSY Right 2022   BRONCHIAL BIOPSY  12/29/2022   Procedure: BRONCHIAL BIOPSIES;  Surgeon: Vergia Glasgow, MD;  Location: [facility];  Service: Pulmonary;;   BRONCHIAL NEEDLE ASPIRATION BIOPSY  12/29/2022   Procedure: BRONCHIAL NEEDLE ASPIRATION BIOPSIES;  Surgeon: Vergia Glasgow, MD;  Location: [facility];  Service: Pulmonary;;   COLONOSCOPY WITH PROPOFOL  N/A 12/07/2022   Procedure: COLONOSCOPY WITH PROPOFOL ;  Surgeon: Selena Daily, MD;  Location: [facility] ENDOSCOPY;  Service: Gastroenterology;  Laterality: N/A;   FLEXIBLE SIGMOIDOSCOPY N/A 04/21/2023   Procedure: FLEXIBLE SIGMOIDOSCOPY, ICG PERFUSION;  Surgeon: Melvenia Stabs, MD;  Location: [facility] ORS;  Service: General;  Laterality: N/A;   FOOT SURGERY Left    little toe correction   MASTECTOMY W/ SENTINEL NODE BIOPSY Right 10/19/2021   Procedure: MASTECTOMY WITH SENTINEL LYMPH NODE BIOPSY ( modified radical);  Surgeon: Eldred Grego, MD;  Location: [facility] ORS;  Service: General;  Laterality: Right;   POLYPECTOMY  12/07/2022   Procedure: POLYPECTOMY;  Surgeon: Selena Daily, MD;  Location: [facility] ENDOSCOPY;  Service: Gastroenterology;;   PORTA CATH INSERTION  01/19/2021   SUBMUCOSAL TATTOO INJECTION  12/07/2022   Procedure: SUBMUCOSAL TATTOO INJECTION;  Surgeon: Selena Daily, MD;  Location: [facility] ENDOSCOPY;  Service: Gastroenterology;;   VIDEO BRONCHOSCOPY WITH ENDOBRONCHIAL ULTRASOUND N/A 12/29/2022   Procedure: VIDEO BRONCHOSCOPY WITH ENDOBRONCHIAL ULTRASOUND;  Surgeon: Vergia Glasgow, MD;  Location: [facility];  Service: Pulmonary;  Laterality: N/A;   XI ROBOTIC ASSISTED LOWER ANTERIOR RESECTION N/A 04/21/2023   Procedure: XI ROBOTIC ASSISTED LOWER ANTERIOR RESECTION, ILEOSTOMY CREATION;  Surgeon: Melvenia Stabs, MD;  Location: [facility];  Service: General;  Laterality: N/A;    Family History  Problem Relation Age of Onset   Melanoma Father 73   Cirrhosis Maternal Grandfather    Breast cancer Paternal Grandmother     Social:  reports that she has been smoking cigarettes. She has never used smokeless  tobacco. She reports that she does not drink alcohol and does not use drugs.  Allergies: No Known Allergies  Medications: I have reviewed the patient's current medications.  No results found for this or any previous visit (from the past 48 hours).  No results found.   PE Blood pressure 112/63, pulse 80, temperature (!) 97.5 F (36.4 C), temperature source Oral, resp. rate 15, weight 57.7 kg, last menstrual period 11/19/2002, SpO2 98%. Constitutional: NAD; conversant Eyes: Moist conjunctiva; no lid lag; anicteric Lungs: Normal respiratory effort CV: RRR GI: Abd soft, NT/ND Psychiatric: Appropriate affect  No results found for this or any previous visit (from the past 48 hours).  No results found.  A/P: Yvonne Scott is an 63 y.o. female with hx of breast cancer, SCC lung cancer, asthma here for evaluation of apparent rectal polypoid mass with biopsies demonstrating cancer.  Cscope 12/07/22 - >5 cm polyp in mid rectum, biopsied, tattooed - on photos, there is also tattoo see just distal to this  CT CAP, no evidence of metastatic rectal cancer; now on treatment for SCC of the lung; recent breast cancer (completed therapy)  MRI Pelvis - 4.1 cm upper/mid rectal mass, 7.0 cm from internal anal sphincter  S/P XRT to chest  OR 04/21/23 - robotic LAR/DLI Path - TVA with HGD/Tis N0  -We have also spent time reviewing her procedure, findings, pathology, and plans. She is following with medical oncology, Dr. Wilhelmenia Harada. -She is doing well. We spent time today really reiterating importance of hydration. We discussed that dehydration is one of the most common issues following her surgery and that she does live in a chronic state of dehydration. We discussed drinking upwards of 64 ounces of water /noncaffeinated beverages daily. We discussed sugary beverages being think she should avoid such as Coke/sweet tea. - The anatomy and physiology of the GI tract was reviewed with the patient as a pertains  to her current anatomy with a loop ileostomy. She is highly motivated to have her loop ileostomy reversed for quality-of-life reasons. -We have discussed various different treatment options going forward including surgery (the most definitive) to address this -takedown of loop ileostomy; diagnostic flexible sigmoidoscopy to reassess anastomosis-this will be done concurrently but prior to ileostomy reversal. If this looks satisfactory, we would proceed with the planned ileostomy reversal at that time.  -The planned procedure, material risks (including, but not limited to, pain, bleeding, infection, scarring, need for blood transfusion, damage to surrounding structures- blood vessels/nerves/viscus/organs, damage to ureter, urine leak, leak from anastomosis, need for additional procedures,low anterior resection syndrome (LARS) = increased fecal urgency and/or frequency, scenarios where another stoma may be necessary and where it may be permanent, worsening of pre-existing medical conditions, chronic diarrhea, constipation secondary to narcotic use, hernia, recurrence, pneumonia, heart attack, stroke, death) benefits and alternatives to surgery were discussed at length. The patient's questions were answered to her and her mother's satisfaction, they voiced understanding and elected to proceed with surgery. Additionally, we discussed typical postoperative expectations and the recovery process.   - No fever/chills, nausea, vomiting, dysuria, abdominal pain or flank pain reported today. Feels well.   Beatris Lincoln, MD [facility], [facility]

## 2023-07-07 NOTE — Discharge Instructions (Addendum)
 POST OP INSTRUCTIONS  DIET: As tolerated. Follow a light bland diet the first 24 hours after arrival home, such as soup, liquids, crackers, etc.  Be sure to include lots of fluids daily.  Avoid fast food or heavy meals as your are more likely to get nauseated.  Eat a low fat the next few days after surgery.  Take your usually prescribed home medications unless otherwise directed.  PAIN CONTROL: Pain is best controlled by a usual combination of three different methods TOGETHER: Ice/Heat Over the counter pain medication Prescription pain medication Most patients will experience some swelling and bruising around the surgical site.  Ice packs or heating pads (30-60 minutes up to 6 times a day) will help. Some people prefer to use ice alone, heat alone, alternating between ice & heat.  Experiment to what works for you.  Swelling and bruising can take several weeks to resolve.   It is helpful to take an over-the-counter pain medication regularly for the first few weeks: Ibuprofen  (Motrin /Advil ) - 200mg  tabs - take 3 tabs (600mg ) every 6 hours as needed for pain Acetaminophen  (Tylenol ) - you may take 650mg  every 6 hours as needed. You can take this with motrin  as they act differently on the body. If you are taking a narcotic pain medication that has acetaminophen  in it, do not take over the counter tylenol  at the same time.  Iii. NOTE: You may take both of these medications together - most patients  find it most helpful when alternating between the two (i.e. Ibuprofen  at 6am,  tylenol  at 9am, ibuprofen  at 12pm ..Aaron Aas) A  prescription for pain medication should be given to you upon discharge.  Take your pain medication as prescribed if your pain is not adequatly controlled with the over-the-counter pain reliefs mentioned above.  Avoid getting constipated.  Between the surgery and the pain medications, it is common to experience some constipation.  Increasing fluid intake and taking a fiber supplement (such as  Metamucil, Citrucel, FiberCon, MiraLax , etc) 1-2 times a day regularly will usually help prevent this problem from occurring.  A mild laxative (prune juice, Milk of Magnesia, MiraLax , etc) should be taken according to package directions if there are no bowel movements after 48 hours.    Dressing: There is no need to pack your former ileostomy site.  You may cover this with dry gauze and change daily.  Is okay to get this area wet in the shower with soap and water  over it.  Avoid baths, pools, lakes, oceans until the wound has fully closed.  ACTIVITIES as tolerated:   Avoid heavy lifting (>10lbs or 1 gallon of milk) for the next 6 weeks. You may resume regular (light) daily activities beginning the next day--such as daily self-care, walking, climbing stairs--gradually increasing activities as tolerated.  If you can walk 30 minutes without difficulty, it is safe to try more intense activity such as jogging, treadmill, bicycling, low-impact aerobics.  DO NOT PUSH THROUGH PAIN.  Let pain be your guide: If it hurts to do something, don't do it. You may drive when you are no longer taking prescription pain medication, you can comfortably wear a seatbelt, and you can safely maneuver your car and apply brakes.   FOLLOW UP in our office Please call [facility] at [phone] to set up an appointment to see your surgeon in the office for a follow-up appointment approximately 2 weeks after your surgery. Make sure that you call for this appointment the day you arrive home to  insure a convenient appointment time.  9. If you have disability or family leave forms that need to be completed, you may have them completed by your primary care physician's office; for return to work instructions, please ask our office staff and they will be happy to assist you in obtaining this documentation   When to call us  [phone]: Poor pain control Reactions / problems with new medications (rash/itching, etc)  Fever over 101.5 F  (38.5 C) Inability to urinate Nausea/vomiting Worsening swelling or bruising Continued bleeding from incision. Increased pain, redness, or drainage from the incision  The clinic staff is available to answer your questions during regular business hours (8:30am-5pm).  Please don't hesitate to call and ask to speak to one of our nurses for clinical concerns.   A surgeon from [facility] is always on call at the hospitals   If you have a medical emergency, go to the nearest emergency room or call 911.  [facility] [address], [address], [place], [place]  [address] MAIN: [id number] FAX: [phone] [email or web address]

## 2023-07-07 NOTE — Op Note (Signed)
 07/07/2023  8:59 AM  PATIENT:  Yvonne Scott  63 y.o. female  Patient Care Team: Alica Antu, NP as PCP - General (Cardiology) Timmy Forbes, MD as Consulting Physician (Oncology)  PRE-OPERATIVE DIAGNOSIS:  Ileostomy status, history of rectal cancer  POST-OPERATIVE DIAGNOSIS:  Same  PROCEDURE:  Takedown of diverting loop ileostomy Diagnostic flexible sigmoidoscopy-necessary to assess anastomosis  SURGEON:  Renford Cartwright. Camilo Cella, MD  ASSISTANT: Candyce Champagne, MD  An experienced assistant was required given the complexity of this procedure and the standard of surgical care. My assistant helped with exposure through counter tension, suctioning, ligation and retraction to better visualize the surgical field. My assistant expedited sewing during the case by following my sutures. Wherever I use the term "we" in the report, my assistant actively helped me with that portion of the procedure.   ANESTHESIA:   local and general  COUNTS:  Sponge, needle and instrument counts were reported correct x2 at the conclusion of the operation.  EBL: 20 mL  DRAINS: None  SPECIMEN: Ileostomy  COMPLICATIONS: None  FINDINGS: Diagnostic flexible sigmoidoscopy demonstrates healthy appearing rectum with colorectal anastomosis at approximately 4 cm from anal verge.  Anastomosis is patent and healthy in appearance.  Colon proximal anastomosis up to the splenic flexure is normal in appearance.  DISPOSITION: PACU in satisfactory condition  DESCRIPTION: The patient was identified in preop holding and taken to the OR where she was placed on the operating room table. SCDs were placed. General endotracheal anesthesia was induced without difficulty.  She was positioned in lithotomy position with Allen stirrups.  Pressure points were evaluated and padded.  She was then prepped and draped in the usual sterile fashion. A surgical timeout was performed indicating the correct patient, procedure, positioning and need  for preoperative antibiotics.   We began with a diagnostic flexible sigmoidoscopy.  First, digital rectal exam was performed and the anastomosis is able to be palpated close to the tip of my finger.  It is patent.  Under direct visualization the flexible sigmoidoscope was inserted in the anal canal.  It is advanced to the rectum.  The rectal mucosa is healthy in appearance.  There are no apparent lesions.  The anastomosis is identified and widely patent.  It is healthy in appearance.  There are visible staples as expected from an EEA.  The colon approximately anastomosis is normal in appearance.  The flexible sigmoidoscope was carefully Darolyn Elks all the way up to the level of the splenic flexure.  It is then carefully withdrawn slowly.  There were no mucosal abnormalities identified.  Attention is then directed at loop ileostomy reversal.  She was prepped and draped in the standard sterile fashion for this portion of the procedure.  A timeout was again called confirming the correct patient, procedure, position, and antibiotics.  The skin incision was then created around the ileostomy which is a loop ileostomy.  Subcutaneous tissues incised electrocautery.  The subcutaneous tissues and carefully dissected away sharply from the loop ileostomy.  The loop ileostomy is then circumferentially freed from its attachments down to the level of the fascia.  The ileostomy is mobilized up out of the wound.  There are no significant adhesions around the ostomy internally.  Attention is then directed to ileostomy closure.  Wound was repaired in the mesentery on each respective limb just beyond the skin site.  The ileostomy loops are individually divided using a GIA blue load stapler.  The intervening mesentery was ligated and divided using the hand-held Enseal device.  Cut edges of the mesentery inspected and hemostatic.  The ileostomy was passed off the specimen.  Attention is then directed at enteroenterostomy.  Each  respective antimesenteric corner is trimmed.  Orientation is confirmed such a there is no twisting of the loops of bowel.  A 75 mm GIA blue load stapler was then used to create the enteroenterostomy.  Staple was closed, held, and fired.  The anastomosis is inspected and noted to be widely patent and hemostatic.  There is palpable pulse in the mesentery going out to the staple lines.  It is well-perfused in appearance.  The anastomotic staple lines were then distracted and the common enterotomy is closed using a TA 60 blue load stapler.  Hemostasis is achieved on the staple line using 3-0 silk U stitches.  The corners of the TA staple line are then dunked using 3-0 silk suture.  The mesenteric defect is closed using 3-0 silk suture.  A 3-0 silk stitch is placed at the apex of the anastomosis.  This is inspected and found to be hemostatic.  This is then gently placed back into the abdomen.  The fascia of the ileostomy site is then closed using 2 running #1 PDS sutures.  The fascial closure was palpated to be complete without any gaps.  The wound is irrigated.  0.25% Marcaine  with epinephrine  and Exparel  was infiltrated into the tissues around the wound.  A 0 Vicryl suture was then used to create a "pursestring" closure of the wound.  It is intentionally left partially open given the contaminated nature of the case.  All sponge, needle, and instrument counts are reported correct.  The form ileostomy site is then gently packed with a single piece of moistened 4 x 4 gauze.  Additional gauze and tape is placed.  She is then awakened from anesthesia, extubated, and transferred to a stretcher for transport to recovery in satisfactory condition.

## 2023-07-07 NOTE — Anesthesia Procedure Notes (Signed)
 Procedure Name: Intubation Date/Time: 07/07/2023 7:43 AM  Performed by: Niemah Schwebke D, CRNAPre-anesthesia Checklist: Patient identified, Emergency Drugs available, Suction available and Patient being monitored Patient Re-evaluated:Patient Re-evaluated prior to induction Oxygen Delivery Method: Circle system utilized Preoxygenation: Pre-oxygenation with 100% oxygen Induction Type: IV induction Ventilation: Mask ventilation without difficulty Laryngoscope Size: Mac and 3 Grade View: Grade I Tube type: Oral Number of attempts: 1 Airway Equipment and Method: Stylet and Oral airway Placement Confirmation: ETT inserted through vocal cords under direct vision, positive ETCO2 and breath sounds checked- equal and bilateral Secured at: 20 cm Tube secured with: Tape Dental Injury: Teeth and Oropharynx as per pre-operative assessment

## 2023-07-07 NOTE — Transfer of Care (Signed)
 Immediate Anesthesia Transfer of Care Note  Patient: Yvonne Scott  Procedure(s) Performed: TAKEDOWN OF LOOP ILEOSTOMY (Abdomen) SIGMOIDOSCOPY, FLEXIBLE  Patient Location: PACU  Anesthesia Type:General  Level of Consciousness: awake, alert , and oriented  Airway & Oxygen Therapy: Patient Spontanous Breathing and Patient connected to face mask oxygen  Post-op Assessment: Report given to RN and Post -op Vital signs reviewed and stable  Post vital signs: Reviewed and stable  Last Vitals:  Vitals Value Taken Time  BP    Temp    Pulse    Resp    SpO2      Last Pain:  Vitals:   07/07/23 0615  TempSrc:   PainSc: 5          Complications: No notable events documented.

## 2023-07-07 NOTE — Anesthesia Postprocedure Evaluation (Signed)
 Anesthesia Post Note  Patient: SMITA LESH  Procedure(s) Performed: TAKEDOWN OF LOOP ILEOSTOMY (Abdomen) SIGMOIDOSCOPY, FLEXIBLE     Patient location during evaluation: PACU Anesthesia Type: General Level of consciousness: awake and alert Pain management: pain level controlled Vital Signs Assessment: post-procedure vital signs reviewed and stable Respiratory status: spontaneous breathing, nonlabored ventilation, respiratory function stable and patient connected to nasal cannula oxygen Cardiovascular status: blood pressure returned to baseline and stable Postop Assessment: no apparent nausea or vomiting Anesthetic complications: no  No notable events documented.  Last Vitals:  Vitals:   07/07/23 0953 07/07/23 1010  BP: 114/79 (!) 143/81  Pulse: 72 82  Resp: 18 18  Temp: (!) 36.4 C   SpO2: 98% 99%    Last Pain:  Vitals:   07/07/23 0615  TempSrc:   PainSc: 5                  Rosalita Combe

## 2023-07-08 ENCOUNTER — Encounter (HOSPITAL_COMMUNITY): Payer: Self-pay | Admitting: Surgery

## 2023-07-08 LAB — CULTURE, BLOOD (ROUTINE X 2)
Culture: NO GROWTH
Culture: NO GROWTH
Special Requests: ADEQUATE
Special Requests: ADEQUATE

## 2023-07-08 LAB — CBC
HCT: 26.7 % — ABNORMAL LOW (ref 36.0–46.0)
Hemoglobin: 8.7 g/dL — ABNORMAL LOW (ref 12.0–15.0)
MCH: 31 pg (ref 26.0–34.0)
MCHC: 32.6 g/dL (ref 30.0–36.0)
MCV: 95 fL (ref 80.0–100.0)
Platelets: 273 10*3/uL (ref 150–400)
RBC: 2.81 MIL/uL — ABNORMAL LOW (ref 3.87–5.11)
RDW: 15.1 % (ref 11.5–15.5)
WBC: 11 10*3/uL — ABNORMAL HIGH (ref 4.0–10.5)
nRBC: 0 % (ref 0.0–0.2)

## 2023-07-08 LAB — BASIC METABOLIC PANEL WITH GFR
Anion gap: 12 (ref 5–15)
BUN: 9 mg/dL (ref 8–23)
CO2: 20 mmol/L — ABNORMAL LOW (ref 22–32)
Calcium: 7.3 mg/dL — ABNORMAL LOW (ref 8.9–10.3)
Chloride: 104 mmol/L (ref 98–111)
Creatinine, Ser: 0.72 mg/dL (ref 0.44–1.00)
GFR, Estimated: >60 mL/min (ref >60)
Glucose, Bld: 99 mg/dL (ref 70–99)
Potassium: 3.5 mmol/L (ref 3.5–5.1)
Sodium: 136 mmol/L (ref 135–145)

## 2023-07-08 MED ORDER — SODIUM CHLORIDE 0.9% FLUSH
10.0000 mL | Freq: Two times a day (BID) | INTRAVENOUS | Status: DC
  Administered 2023-07-09 – 2023-07-11 (×5): 10 mL

## 2023-07-08 MED ORDER — CHLORHEXIDINE GLUCONATE CLOTH 2 % EX PADS
6.0000 | MEDICATED_PAD | Freq: Every day | CUTANEOUS | Status: DC
  Administered 2023-07-09 – 2023-07-12 (×4): 6 via TOPICAL

## 2023-07-08 MED ORDER — SODIUM CHLORIDE 0.9% FLUSH
10.0000 mL | INTRAVENOUS | Status: DC | PRN
  Administered 2023-07-12: 10 mL

## 2023-07-08 NOTE — Progress Notes (Signed)
 OT Cancellation Note  Patient Details Name: Yvonne Scott MRN: [id number] DOB: 12-10-1960   Cancelled Treatment:    Reason Eval/Treat Not Completed: OT screened, no needs identified, will sign off Patient reported she does not need OT that she can complete all ADLs herself at this time. OT to sign off.  Wynette Heckler, MS Acute Rehabilitation Department Office# [phone]  07/08/2023, 2:47 PM

## 2023-07-08 NOTE — Evaluation (Signed)
 Physical Therapy Evaluation Patient Details Name: Yvonne Scott MRN: [id number] DOB: 08/23/1960 Today's Date: 07/08/2023  History of Present Illness  Pt is a 63 year old female s/p takedown of diverting loop ileostomy on 07/07/23.   Past medical history of colostomy s/p colon resection for rectal cancer, tobacco use, invasive ductal carcinoma of right breast, neuropathy, anxiety, squamous cell lung cancer on right, rectal adenocarcinoma, GERD, HTN, fatty liver, HLD  Clinical Impression  Pt admitted with above diagnosis.  Pt currently with functional limitations due to the deficits listed below (see PT Problem List). Pt will benefit from acute skilled PT to increase their independence and safety with mobility to allow discharge.  Pt reports she was able to ambulate yesterday however reports concern for being discharged too soon.  Pt states she was released too early last admission and was dehydrated.  Pt encouraged to mobilize and ambulate 3-4x/day.  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  Pt also reports she has DME at home if needed.         If plan is discharge home, recommend the following:     Can travel by private vehicle        Equipment Recommendations None recommended by PT  Recommendations for Other Services       Functional Status Assessment Patient has had a recent decline in their functional status and demonstrates the ability to make significant improvements in function in a reasonable and predictable amount of time.     Precautions / Restrictions Precautions Precautions: Fall      Mobility  Bed Mobility Overal bed mobility: Modified Independent             General bed mobility comments: HOB elevated    Transfers Overall transfer level: Needs assistance Equipment used: None Transfers: Sit to/from Stand, Bed to chair/wheelchair/BSC Sit to Stand: Contact guard assist Stand pivot transfers: Contact guard assist         General transfer  comment: CGA for safety to [facility] per request (had liquid BM)    Ambulation/Gait Ambulation/Gait assistance: Contact guard assist Gait Distance (Feet): 200 Feet Assistive device: Rolling walker (2 wheels) Gait Pattern/deviations: Step-through pattern, Decreased stride length, Trunk flexed       General Gait Details: keeps RW too far forward, did not use RW in room and appears stable  Stairs            Wheelchair Mobility     Tilt Bed    Modified Rankin (Stroke Patients Only)       Balance Overall balance assessment: No apparent balance deficits (not formally assessed)                                           Pertinent Vitals/Pain Pain Assessment Pain Assessment: 0-10 Pain Score: 5  Pain Location: abdomen surgical site Pain Descriptors / Indicators: Sore Pain Intervention(s): Monitored during session, Repositioned (reports she has already requested tylenol )    Home Living Family/patient expects to be discharged to:: Private residence Living Arrangements: Parent               Home Equipment: Agricultural consultant (2 wheels);Rollator (4 wheels) Additional Comments: per documentation 2 months ago, pt from mobile home and no stairs. however today, pt reports her father lives with her and she is caretaker.  she has a flight of stairs    Prior  Function Prior Level of Function : Independent/Modified Independent                     Extremity/Trunk Assessment        Lower Extremity Assessment Lower Extremity Assessment: Overall WFL for tasks assessed       Communication   Communication Communication: No apparent difficulties    Cognition Arousal: Alert Behavior During Therapy: WFL for tasks assessed/performed   PT - Cognitive impairments: No apparent impairments                         Following commands: Intact       Cueing       General Comments      Exercises     Assessment/Plan    PT Assessment Patient  needs continued PT services  PT Problem List Decreased strength;Decreased activity tolerance;Decreased mobility;Decreased knowledge of use of DME       PT Treatment Interventions DME instruction;Gait training;Balance training;Functional mobility training;Therapeutic activities;Therapeutic exercise;Patient/family education;Stair training    PT Goals (Current goals can be found in the Care Plan section)  Acute Rehab PT Goals PT Goal Formulation: With patient Time For Goal Achievement: 07/15/23 Potential to Achieve Goals: Good    Frequency Min 2X/week     Co-evaluation               AM-PAC PT "6 Clicks" Mobility  Outcome Measure Help needed turning from your back to your side while in a flat bed without using bedrails?: A Little Help needed moving from lying on your back to sitting on the side of a flat bed without using bedrails?: A Little Help needed moving to and from a bed to a chair (including a wheelchair)?: A Little Help needed standing up from a chair using your arms (e.g., wheelchair or bedside chair)?: A Little Help needed to walk in hospital room?: A Little Help needed climbing 3-5 steps with a railing? : A Little 6 Click Score: 18    End of Session   Activity Tolerance: Patient tolerated treatment well Patient left: in chair;with call bell/phone within reach;with chair alarm set Nurse Communication: Mobility status PT Visit Diagnosis: Difficulty in walking, not elsewhere classified (R26.2)    Time: 4034-7425 PT Time Calculation (min) (ACUTE ONLY): 24 min   Charges:   PT Evaluation $PT Eval Low Complexity: 1 Low PT Treatments $Gait Training: 8-22 mins PT General Charges $$ ACUTE PT VISIT: 1 Visit        Blanch Bunde, DPT Physical Therapist Acute Rehabilitation Services Office: [phone]   Kati L Payson 07/08/2023, 1:17 PM

## 2023-07-08 NOTE — Progress Notes (Signed)
 1 Day Post-Op   Subjective/Chief Complaint: Having bowel function, no complaints   Objective: Vital signs in last 24 hours: Temp:  [97.1 F (36.2 C)-98.1 F (36.7 C)] 97.7 F (36.5 C) (04/26 0204) Pulse Rate:  [66-82] 77 (04/26 0204) Resp:  [11-19] 18 (04/26 0204) BP: (114-156)/(68-95) 117/83 (04/26 0204) SpO2:  [92 %-100 %] 99 % (04/26 0756) FiO2 (%):  [28 %] 28 % (04/26 0756) Weight:  [57.7 kg] 57.7 kg (04/25 1010) Last BM Date : 07/03/23  Intake/Output from previous day: 04/25 0701 - 04/26 0700 In: 2391.2 [P.O.:330; I.V.:2061.2] Out: 600 [Urine:600] Intake/Output this shift: No intake/output data recorded.  General nad Pulm effort normal Cv regular Ab soft approp tender ileostomy site without infection, wick removed  Lab Results:  Recent Labs    07/08/23 0509  WBC 11.0*  HGB 8.7*  HCT 26.7*  PLT 273   BMET Recent Labs    07/08/23 0509  NA 136  K 3.5  CL 104  CO2 20*  GLUCOSE 99  BUN 9  CREATININE 0.72  CALCIUM  7.3*   PT/INR No results for input(s): "LABPROT", "INR" in the last 72 hours. ABG No results for input(s): "PHART", "HCO3" in the last 72 hours.  Invalid input(s): "PCO2", "PO2"  Studies/Results: No results found.  Anti-infectives: Anti-infectives (From admission, onward)    Start     Dose/Rate Route Frequency Ordered Stop   07/07/23 0730  cefoTEtan  (CEFOTAN ) 2 g in sodium chloride  0.9 % 100 mL IVPB  Status:  Discontinued        2 g 200 mL/hr over 30 Minutes Intravenous On call to O.R. 07/07/23 0720 07/07/23 0721   07/07/23 0645  cefoTEtan  (CEFOTAN ) 2 g in sodium chloride  0.9 % 100 mL IVPB        2 g 200 mL/hr over 30 Minutes Intravenous  Once 07/07/23 1610 07/07/23 1543       Assessment/Plan: POD 1 ileostomy reversal/flex sig -doing great, wick removed -will do soft diet this am -pt/ot for dc planning but she could go home as early as tomorrow -home meds -heparin  sq for prophylaxis   Yvonne Scott 07/08/2023

## 2023-07-09 LAB — CBC
HCT: 23.7 % — ABNORMAL LOW (ref 36.0–46.0)
Hemoglobin: 7.9 g/dL — ABNORMAL LOW (ref 12.0–15.0)
MCH: 31.1 pg (ref 26.0–34.0)
MCHC: 33.3 g/dL (ref 30.0–36.0)
MCV: 93.3 fL (ref 80.0–100.0)
Platelets: 281 10*3/uL (ref 150–400)
RBC: 2.54 MIL/uL — ABNORMAL LOW (ref 3.87–5.11)
RDW: 15.6 % — ABNORMAL HIGH (ref 11.5–15.5)
WBC: 7.8 10*3/uL (ref 4.0–10.5)
nRBC: 0 % (ref 0.0–0.2)

## 2023-07-09 LAB — BASIC METABOLIC PANEL WITH GFR
Anion gap: 8 (ref 5–15)
BUN: 11 mg/dL (ref 8–23)
CO2: 23 mmol/L (ref 22–32)
Calcium: 7 mg/dL — ABNORMAL LOW (ref 8.9–10.3)
Chloride: 108 mmol/L (ref 98–111)
Creatinine, Ser: 0.6 mg/dL (ref 0.44–1.00)
GFR, Estimated: >60 mL/min (ref >60)
Glucose, Bld: 82 mg/dL (ref 70–99)
Potassium: 2.7 mmol/L — CL (ref 3.5–5.1)
Sodium: 139 mmol/L (ref 135–145)

## 2023-07-09 MED ORDER — POTASSIUM CHLORIDE CRYS ER 20 MEQ PO TBCR
40.0000 meq | EXTENDED_RELEASE_TABLET | Freq: Two times a day (BID) | ORAL | Status: AC
  Administered 2023-07-09 – 2023-07-10 (×3): 40 meq via ORAL
  Filled 2023-07-09 (×3): qty 2

## 2023-07-09 MED ORDER — POTASSIUM CHLORIDE CRYS ER 20 MEQ PO TBCR
40.0000 meq | EXTENDED_RELEASE_TABLET | Freq: Two times a day (BID) | ORAL | Status: DC
  Administered 2023-07-09: 40 meq via ORAL
  Filled 2023-07-09: qty 2

## 2023-07-09 NOTE — Progress Notes (Addendum)
 Lab called with a K+ of 2.7. Port-ta-cath was access during the even hour. Patient states she feels dehydrated. On-call MD for [facility] was text paged.

## 2023-07-09 NOTE — Progress Notes (Signed)
 2 Days Post-Op   Subjective/Chief Complaint: Multiple loose bms, no n/v, tol diet, k low   Objective: Vital signs in last 24 hours: Temp:  [97.6 F (36.4 C)-98.6 F (37 C)] 97.6 F (36.4 C) (04/27 0531) Pulse Rate:  [82] 82 (04/27 0531) Resp:  [15-17] 17 (04/27 0531) BP: (93-107)/(51-71) 107/71 (04/27 0531) SpO2:  [83 %-100 %] 96 % (04/27 0808) FiO2 (%):  [21 %] 21 % (04/27 0808) Weight:  [58 kg] 58 kg (04/27 0701) Last BM Date : 07/09/23  Intake/Output from previous day: 04/26 0701 - 04/27 0700 In: 780 [P.O.:780] Out: -  Intake/Output this shift: Total I/O In: 10 [I.V.:10] Out: -   General nad Pulm effort normal Cv regular Ab soft approp tender just changed dressing    Lab Results:  Recent Labs    07/08/23 0509 07/09/23 0510  WBC 11.0* 7.8  HGB 8.7* 7.9*  HCT 26.7* 23.7*  PLT 273 281   BMET Recent Labs    07/08/23 0509 07/09/23 0510  NA 136 139  K 3.5 2.7*  CL 104 108  CO2 20* 23  GLUCOSE 99 82  BUN 9 11  CREATININE 0.72 0.60  CALCIUM  7.3* 7.0*   PT/INR No results for input(s): "LABPROT", "INR" in the last 72 hours. ABG No results for input(s): "PHART", "HCO3" in the last 72 hours.  Invalid input(s): "PCO2", "PO2"  Studies/Results: No results found.  Anti-infectives: Anti-infectives (From admission, onward)    Start     Dose/Rate Route Frequency Ordered Stop   07/07/23 0730  cefoTEtan  (CEFOTAN ) 2 g in sodium chloride  0.9 % 100 mL IVPB  Status:  Discontinued        2 g 200 mL/hr over 30 Minutes Intravenous On call to O.R. 07/07/23 0720 07/07/23 0721   07/07/23 0645  cefoTEtan  (CEFOTAN ) 2 g in sodium chloride  0.9 % 100 mL IVPB        2 g 200 mL/hr over 30 Minutes Intravenous  Once 07/07/23 2725 07/07/23 1543       Assessment/Plan: POD 2 ileostomy reversal/flex sig -replace K and recheck in am -soft diet with supplements -home meds -heparin  sq for prophylaxis  -not ready for home today  Enid Harry 07/09/2023

## 2023-07-09 NOTE — TOC Initial Note (Signed)
 Transition of Care [facility]) - Initial/Assessment Note    Patient Details  Name: Yvonne Scott MRN: [id number] Date of Birth: December 31, 1960  Transition of [facility]) CM/SW Contact:    Levie Ream, RN Phone Number: 07/09/2023, 4:39 PM  Clinical Narrative:                 Eldora Greet w/ pt in room; pt says she lives at home; she plans to return at d/c; she identified POC Gloris Lars (mother [phone]; pt says she will arrange transportation; she verified insurance/PCP; pt says she does not have DME, HH services, or home oxygen; pt requested resource for physical assistance when grocery shopping; resources for social services and home & elder care placed in d/c instructions; copies of resources also given to pt; pt also encouraged to contact her insurance for add'l resources; she verbalized understanding will make appts at agencies of choice; no TOC needs; TOC us  signing off; please place consult if needed.  Expected Discharge Plan: Home/Self Care Barriers to Discharge: Continued Medical Work up   Patient Goals and CMS Choice Patient states their goals for this hospitalization and ongoing recovery are:: home          Expected Discharge Plan and Services   Discharge Planning Services: CM Consult   Living arrangements for the past 2 months: Mobile Home                                      Prior Living Arrangements/Services Living arrangements for the past 2 months: Mobile Home Lives with:: Parents Patient language and need for interpreter reviewed:: Yes Do you feel safe going back to the place where you live?: Yes      Need for Family Participation in Patient Care: Yes (Comment)   Current home services:  (n/a) Criminal Activity/Legal Involvement Pertinent to Current Situation/Hospitalization: No - Comment as needed  Activities of Daily Living   ADL Screening (condition at time of admission) Independently performs ADLs?: Yes (appropriate for developmental  age) Is the patient deaf or have difficulty hearing?: No Does the patient have difficulty seeing, even when wearing glasses/contacts?: No Does the patient have difficulty concentrating, remembering, or making decisions?: No  Permission Sought/Granted Permission sought to share information with : Case Manager Permission granted to share information with : Yes, Verbal Permission Granted  Share Information with NAME: Case Manager     Permission granted to share info w Relationship: Gloris Lars (mother) [phone]     Emotional Assessment Appearance:: Appears stated age Attitude/Demeanor/Rapport: Gracious Affect (typically observed): Accepting Orientation: : Oriented to Self, Oriented to Place, Oriented to  Time, Oriented to Situation Alcohol / Substance Use: Not Applicable Psych Involvement: No (comment)  Admission diagnosis:  Malignant neoplasm of rectum (HCC) [C20] Ileostomy status (HCC) [Z93.2] S/P closure of ileostomy [Z98.890] Patient Active Problem List   Diagnosis Date Noted   S/P closure of ileostomy 07/07/2023   Hypotension 07/03/2023   Acute renal failure (ARF) (HCC) 07/02/2023   Acute URI 06/19/2023   Cough 05/15/2023   Acute bronchitis 05/15/2023   Ileostomy care (HCC) 05/03/2023   S/P partial colectomy 04/21/2023   Pain in the abdomen 02/06/2023   Genetic testing 01/24/2023   Abnormal PET scan of colon 12/07/2022   Rectal adenocarcinoma (HCC) 10/13/2022   Squamous cell lung cancer, right (HCC) 08/12/2022   Mixed hyperlipidemia 06/21/2022   Cramps, muscle, general 06/21/2022   Other fatigue  06/21/2022   Rib pain on right side 01/25/2022   Vitamin D  deficiency 01/25/2022   Thrush 01/25/2022   B12 deficiency 01/06/2022   Protein-calorie malnutrition, severe 09/07/2021   Dysphagia 09/05/2021   Prolonged QT interval 09/05/2021   Syncope, vasovagal 09/04/2021   Syncope 09/04/2021   Antineoplastic chemotherapy induced anemia 08/24/2021   Neutropenic fever  (HCC)    HCAP (healthcare-associated pneumonia) 07/16/2021   Sepsis (HCC) 07/16/2021   Pancytopenia (HCC)    Port-A-Cath in place 07/07/2021   Anxiety associated with cancer diagnosis (HCC) 07/07/2021   Chemotherapy-induced neuropathy (HCC) 06/08/2021   Anxiety 06/08/2021   Hypomagnesemia 04/13/2021   Generalized body aches 03/11/2021   Neoplasm related pain 03/11/2021   Encounter for antineoplastic chemotherapy 03/11/2021   Encounter for monitoring cardiotoxic drug therapy 02/27/2021   Invasive ductal carcinoma of right breast (HCC) 02/20/2021   Goals of care, counseling/discussion 02/20/2021   Metabolic acidosis 02/22/2020   DKA (diabetic ketoacidosis) (HCC) 02/21/2020   Elevated LFTs 02/21/2020   AKI (acute kidney injury) (HCC) 02/21/2020   Leukocytosis 02/21/2020   Hyponatremia 01/30/2017   Hypochloremia 01/30/2017   Hypokalemia 01/27/2017   Hyperglycemia 01/27/2017   Back pain 12/15/2016   Chronic lower back pain 12/15/2016   Elevated ferritin 11/07/2016   Aortic atherosclerosis (HCC) 10/24/2016   Fatty liver 10/24/2016   Thrombocytopenia (HCC) 10/24/2016   Medication monitoring encounter 11/19/2015   GERD (gastroesophageal reflux disease) 11/19/2015   Tobacco abuse 11/19/2015   Essential hypertension, benign 11/19/2015   PCP:  Alica Antu, NP Pharmacy:   [facility] [address] ([address]), [place] - [address] [place]) [place] [address] Phone: [phone] Fax: [phone]  [facility] [address] [place] [address] Phone: [phone] Fax: [phone]  [facility] [address] [place] [address] Phone: [phone] Fax: [phone]     Social Drivers of Health (SDOH) Social History: SDOH Screenings   Food Insecurity: No Food Insecurity (07/09/2023)  Housing: Low Risk  (07/09/2023)  Transportation Needs: No  Transportation Needs (07/09/2023)  Utilities: Not At Risk (07/09/2023)  Alcohol Screen: Low Risk  (01/06/2022)  Depression (PHQ2-9): Medium Risk (01/06/2022)  Financial Resource Strain: Low Risk  (01/06/2022)  Physical Activity: Inactive (01/06/2022)  Social Connections: Moderately Isolated (07/01/2023)  Stress: Stress Concern Present (01/06/2022)  Tobacco Use: High Risk (07/07/2023)   SDOH Interventions: Food Insecurity Interventions: Intervention Not Indicated, Inpatient TOC Housing Interventions: Intervention Not Indicated, Inpatient TOC Transportation Interventions: Intervention Not Indicated, Inpatient TOC Utilities Interventions: Intervention Not Indicated, Inpatient TOC   Readmission Risk Interventions    07/09/2023    4:37 PM 04/23/2023    1:26 PM  Readmission Risk Prevention Plan  Transportation Screening Complete Complete  PCP or Specialist Appt within 5-7 Days Complete Complete  Home Care Screening Complete Complete  Medication Review (RN CM) Complete Complete

## 2023-07-10 ENCOUNTER — Other Ambulatory Visit (HOSPITAL_COMMUNITY): Payer: Self-pay

## 2023-07-10 LAB — BASIC METABOLIC PANEL WITH GFR
Anion gap: 8 (ref 5–15)
BUN: 8 mg/dL (ref 8–23)
CO2: 23 mmol/L (ref 22–32)
Calcium: 6.9 mg/dL — ABNORMAL LOW (ref 8.9–10.3)
Chloride: 107 mmol/L (ref 98–111)
Creatinine, Ser: 0.64 mg/dL (ref 0.44–1.00)
GFR, Estimated: >60 mL/min (ref >60)
Glucose, Bld: 81 mg/dL (ref 70–99)
Potassium: 3.7 mmol/L (ref 3.5–5.1)
Sodium: 138 mmol/L (ref 135–145)

## 2023-07-10 LAB — CBC
HCT: 26.3 % — ABNORMAL LOW (ref 36.0–46.0)
Hemoglobin: 8.4 g/dL — ABNORMAL LOW (ref 12.0–15.0)
MCH: 30.9 pg (ref 26.0–34.0)
MCHC: 31.9 g/dL (ref 30.0–36.0)
MCV: 96.7 fL (ref 80.0–100.0)
Platelets: 296 10*3/uL (ref 150–400)
RBC: 2.72 MIL/uL — ABNORMAL LOW (ref 3.87–5.11)
RDW: 15.7 % — ABNORMAL HIGH (ref 11.5–15.5)
WBC: 7.6 10*3/uL (ref 4.0–10.5)
nRBC: 0 % (ref 0.0–0.2)

## 2023-07-10 LAB — SURGICAL PATHOLOGY

## 2023-07-10 NOTE — Progress Notes (Signed)
 Mobility Specialist - Progress Note   07/10/23 1327  Mobility  Activity Dangled on edge of bed  Level of Assistance Standby assist, set-up cues, supervision of patient - no hands on  Range of Motion/Exercises Active  Activity Response Tolerated well  Mobility Referral Yes  Mobility visit 1 Mobility  Mobility Specialist Start Time (ACUTE ONLY) 1317  Mobility Specialist Stop Time (ACUTE ONLY) 1327  Mobility Specialist Time Calculation (min) (ACUTE ONLY) 10 min   Pt was found in bed, declined ambulation. Stated needing to work BUE more due to swelling. Provided pt with stress ball and exercises she could perform to mobilize BUE. Was left in bed with all needs met. Call bell in reach.  Lorna Rose Mobility Specialist

## 2023-07-10 NOTE — Progress Notes (Signed)
 Subjective No acute events. Feeling reasonably well - sore. Some nausea, no emesis. Had 2-3 BM yesterday, one this morning. Diarrhea much improved. Tolerating solids albeit appetite not back to baseline yet  Objective: Vital signs in last 24 hours: Temp:  [98 F (36.7 C)-98.2 F (36.8 C)] 98.1 F (36.7 C) (04/28 0429) Pulse Rate:  [77-85] 84 (04/28 0429) Resp:  [17-18] 18 (04/28 0429) BP: (97-133)/(65-86) 133/86 (04/28 0429) SpO2:  [96 %-100 %] 97 % (04/28 0429) FiO2 (%):  [21 %] 21 % (04/27 0808) Weight:  [57.9 kg] 57.9 kg (04/28 0706) Last BM Date : 07/09/23  Intake/Output from previous day: 04/27 0701 - 04/28 0700 In: 1030 [P.O.:1020; I.V.:10] Out: -  Intake/Output this shift: No intake/output data recorded.  Gen: NAD, comfortable CV: RRR Pulm: Normal work of breathing Abd: Soft, appropriate tenderness around incision, nondistended Ext: SCDs in place  Lab Results: CBC  Recent Labs    07/09/23 0510 07/10/23 0544  WBC 7.8 7.6  HGB 7.9* 8.4*  HCT 23.7* 26.3*  PLT 281 296   BMET Recent Labs    07/09/23 0510 07/10/23 0544  NA 139 138  K 2.7* 3.7  CL 108 107  CO2 23 23  GLUCOSE 82 81  BUN 11 8  CREATININE 0.60 0.64  CALCIUM  7.0* 6.9*   PT/INR No results for input(s): "LABPROT", "INR" in the last 72 hours. ABG No results for input(s): "PHART", "HCO3" in the last 72 hours.  Invalid input(s): "PCO2", "PO2"  Studies/Results:  Anti-infectives: Anti-infectives (From admission, onward)    Start     Dose/Rate Route Frequency Ordered Stop   07/07/23 0730  cefoTEtan  (CEFOTAN ) 2 g in sodium chloride  0.9 % 100 mL IVPB  Status:  Discontinued        2 g 200 mL/hr over 30 Minutes Intravenous On call to O.R. 07/07/23 0720 07/07/23 0721   07/07/23 0645  cefoTEtan  (CEFOTAN ) 2 g in sodium chloride  0.9 % 100 mL IVPB        2 g 200 mL/hr over 30 Minutes Intravenous  Once 07/07/23 1610 07/07/23 1543        Assessment/Plan: Patient Active Problem List    Diagnosis Date Noted   S/P closure of ileostomy 07/07/2023   Hypotension 07/03/2023   Acute renal failure (ARF) (HCC) 07/02/2023   Acute URI 06/19/2023   Cough 05/15/2023   Acute bronchitis 05/15/2023   Ileostomy care (HCC) 05/03/2023   S/P partial colectomy 04/21/2023   Pain in the abdomen 02/06/2023   Genetic testing 01/24/2023   Abnormal PET scan of colon 12/07/2022   Rectal adenocarcinoma (HCC) 10/13/2022   Squamous cell lung cancer, right (HCC) 08/12/2022   Mixed hyperlipidemia 06/21/2022   Cramps, muscle, general 06/21/2022   Other fatigue 06/21/2022   Rib pain on right side 01/25/2022   Vitamin D  deficiency 01/25/2022   Thrush 01/25/2022   B12 deficiency 01/06/2022   Protein-calorie malnutrition, severe 09/07/2021   Dysphagia 09/05/2021   Prolonged QT interval 09/05/2021   Syncope, vasovagal 09/04/2021   Syncope 09/04/2021   Antineoplastic chemotherapy induced anemia 08/24/2021   Neutropenic fever (HCC)    HCAP (healthcare-associated pneumonia) 07/16/2021   Sepsis (HCC) 07/16/2021   Pancytopenia (HCC)    Port-A-Cath in place 07/07/2021   Anxiety associated with cancer diagnosis (HCC) 07/07/2021   Chemotherapy-induced neuropathy (HCC) 06/08/2021   Anxiety 06/08/2021   Hypomagnesemia 04/13/2021   Generalized body aches 03/11/2021   Neoplasm related pain 03/11/2021   Encounter for antineoplastic chemotherapy 03/11/2021   Encounter  for monitoring cardiotoxic drug therapy 02/27/2021   Invasive ductal carcinoma of right breast (HCC) 02/20/2021   Goals of care, counseling/discussion 02/20/2021   Metabolic acidosis 02/22/2020   DKA (diabetic ketoacidosis) (HCC) 02/21/2020   Elevated LFTs 02/21/2020   AKI (acute kidney injury) (HCC) 02/21/2020   Leukocytosis 02/21/2020   Hyponatremia 01/30/2017   Hypochloremia 01/30/2017   Hypokalemia 01/27/2017   Hyperglycemia 01/27/2017   Back pain 12/15/2016   Chronic lower back pain 12/15/2016   Elevated ferritin 11/07/2016    Aortic atherosclerosis (HCC) 10/24/2016   Fatty liver 10/24/2016   Thrombocytopenia (HCC) 10/24/2016   Medication monitoring encounter 11/19/2015   GERD (gastroesophageal reflux disease) 11/19/2015   Tobacco abuse 11/19/2015   Essential hypertension, benign 11/19/2015   s/p Procedure(s): TAKEDOWN OF LOOP ILEOSTOMY SIGMOIDOSCOPY, FLEXIBLE 07/07/2023  - Hypokalemia ~resolved; monitor - Soft diet as tolerated - Ambulate/therapies - We spent time reviewing her procedure, findings and plans. Her questions were answered, she expressed understanding and agreement with plan - PPX: SQH, SCDs - Dispo: pending PT, possible discharge tomorrow   LOS: 3 days   Yvonne Lincoln, MD [facility], A [facility]

## 2023-07-11 ENCOUNTER — Other Ambulatory Visit (HOSPITAL_COMMUNITY): Payer: Self-pay

## 2023-07-11 ENCOUNTER — Ambulatory Visit: Payer: Medicaid Other

## 2023-07-11 LAB — BASIC METABOLIC PANEL WITH GFR
Anion gap: 13 (ref 5–15)
BUN: 7 mg/dL — ABNORMAL LOW (ref 8–23)
CO2: 19 mmol/L — ABNORMAL LOW (ref 22–32)
Calcium: 7.2 mg/dL — ABNORMAL LOW (ref 8.9–10.3)
Chloride: 103 mmol/L (ref 98–111)
Creatinine, Ser: 0.36 mg/dL — ABNORMAL LOW (ref 0.44–1.00)
GFR, Estimated: >60 mL/min (ref >60)
Glucose, Bld: 76 mg/dL (ref 70–99)
Potassium: 4.1 mmol/L (ref 3.5–5.1)
Sodium: 135 mmol/L (ref 135–145)

## 2023-07-11 MED ORDER — LOPERAMIDE HCL 2 MG PO CAPS
2.0000 mg | ORAL_CAPSULE | Freq: Two times a day (BID) | ORAL | Status: DC
Start: 2023-07-11 — End: 2023-07-12
  Administered 2023-07-11 – 2023-07-12 (×3): 2 mg via ORAL
  Filled 2023-07-11 (×3): qty 1

## 2023-07-11 MED ORDER — KCL IN DEXTROSE-NACL 20-5-0.45 MEQ/L-%-% IV SOLN
INTRAVENOUS | Status: DC
  Filled 2023-07-11 (×3): qty 1000

## 2023-07-11 MED ORDER — LACTATED RINGERS IV BOLUS
1000.0000 mL | Freq: Once | INTRAVENOUS | Status: AC
  Administered 2023-07-11: 1000 mL via INTRAVENOUS

## 2023-07-11 MED ORDER — GUAIFENESIN ER 600 MG PO TB12
600.0000 mg | ORAL_TABLET | Freq: Two times a day (BID) | ORAL | Status: DC
  Administered 2023-07-11 – 2023-07-12 (×3): 600 mg via ORAL
  Filled 2023-07-11 (×3): qty 1

## 2023-07-11 MED ORDER — LOPERAMIDE HCL 2 MG PO CAPS
2.0000 mg | ORAL_CAPSULE | Freq: Two times a day (BID) | ORAL | 0 refills | Status: AC
  Filled 2023-07-11: qty 60, 30d supply, fill #0

## 2023-07-11 MED ORDER — GUAIFENESIN ER 600 MG PO TB12
600.0000 mg | ORAL_TABLET | Freq: Two times a day (BID) | ORAL | 0 refills | Status: AC
Start: 2023-07-11 — End: 2023-07-18
  Filled 2023-07-11: qty 20, 10d supply, fill #0

## 2023-07-11 NOTE — Progress Notes (Signed)
 Physical Therapy Treatment Patient Details Name: Yvonne Scott MRN: [id number] DOB: 24-Apr-1960 Today's Date: 07/11/2023   History of Present Illness Pt is a 63 year old female s/p takedown of diverting loop ileostomy on 07/07/23.   Past medical history of colostomy s/p colon resection for rectal cancer, tobacco use, invasive ductal carcinoma of right breast, neuropathy, anxiety, squamous cell lung cancer on right, rectal adenocarcinoma, GERD, HTN, fatty liver, HLD    PT Comments  Pt agreeable to amb with encouragement; amb distance limited d/t bowel urgency. Pt frustrated with having numerous liquid bm's everday. Pt reports she did not eat any breakfast. Pt with c/o pain in peri area, provided with barrier cream. Pt is progressing toward goals however amb decr distance than on eval d/t bowel issues. Will continue to follow in acute setting; plan for no f/u PT post acute remains appropriate.    If plan is discharge home, recommend the following:     Can travel by private vehicle        Equipment Recommendations  None recommended by PT    Recommendations for Other Services       Precautions / Restrictions Precautions Precautions: Fall Restrictions Weight Bearing Restrictions Per Provider Order: No     Mobility  Bed Mobility Overal bed mobility: Modified Independent             General bed mobility comments: HOB elevated    Transfers Overall transfer level: Needs assistance Equipment used: None, Rolling walker (2 wheels) Transfers: Sit to/from Stand Sit to Stand: Supervision           General transfer comment: supervision for safety; STS from bed and toilet    Ambulation/Gait Ambulation/Gait assistance: Contact guard assist, Supervision Gait Distance (Feet): 40 Feet (15' more) Assistive device: Rolling walker (2 wheels) Gait Pattern/deviations: Step-through pattern, Decreased stride length, Trunk flexed       General Gait Details: cues for proper RW  position, tends to keep RW too far fwd; able to amb short distance without RW with no LOB   Stairs             Wheelchair Mobility     Tilt Bed    Modified Rankin (Stroke Patients Only)       Balance                                            Communication Communication Communication: No apparent difficulties  Cognition Arousal: Alert Behavior During Therapy: WFL for tasks assessed/performed   PT - Cognitive impairments: No apparent impairments                         Following commands: Intact      Cueing    Exercises      General Comments        Pertinent Vitals/Pain Pain Assessment Pain Assessment: 0-10 Pain Score: 6  Pain Location: abdomen surgical site Pain Descriptors / Indicators: Discomfort, Grimacing, Sore Pain Intervention(s): Limited activity within patient's tolerance, Monitored during session, Premedicated before session, Repositioned    Home Living                          Prior Function            PT Goals (current goals can now be found in the care plan  section) Acute Rehab PT Goals PT Goal Formulation: With patient Time For Goal Achievement: 07/15/23 Potential to Achieve Goals: Good Progress towards PT goals: Progressing toward goals    Frequency    Min 2X/week      PT Plan      Co-evaluation              AM-PAC PT "6 Clicks" Mobility   Outcome Measure  Help needed turning from your back to your side while in a flat bed without using bedrails?: None Help needed moving from lying on your back to sitting on the side of a flat bed without using bedrails?: A Little Help needed moving to and from a bed to a chair (including a wheelchair)?: A Little Help needed standing up from a chair using your arms (e.g., wheelchair or bedside chair)?: A Little Help needed to walk in hospital room?: A Little Help needed climbing 3-5 steps with a railing? : A Little 6 Click Score: 19     End of Session   Activity Tolerance: Patient tolerated treatment well Patient left: in bed;with call bell/phone within reach (no alarm on arrival)   PT Visit Diagnosis: Difficulty in walking, not elsewhere classified (R26.2)     Time: 1206-1220 PT Time Calculation (min) (ACUTE ONLY): 14 min  Charges:    $Gait Training: 8-22 mins PT General Charges $$ ACUTE PT VISIT: 1 Visit                     Ylonda Storr, PT  Acute Rehab Dept [facility]) [phone]  07/11/2023    [facility] 07/11/2023, 12:25 PM

## 2023-07-11 NOTE — Progress Notes (Addendum)
 Subjective No acute events. Feeling reasonably well. Had 2-3 BM yesterday, one this morning still liquid but frequency much better.. Tolerating solids, reports she is drinking plenty of liquids and staying hydrated. Voiding multiple times per day  Objective: Vital signs in last 24 hours: Temp:  [98.2 F (36.8 C)-98.4 F (36.9 C)] 98.2 F (36.8 C) (04/29 0552) Pulse Rate:  [81-88] 85 (04/29 0552) Resp:  [16-18] 18 (04/29 0552) BP: (122-130)/(74-90) 130/74 (04/29 0552) SpO2:  [97 %-98 %] 97 % (04/29 0552) Last BM Date : 07/10/23  Intake/Output from previous day: 04/28 0701 - 04/29 0700 In: 170 [P.O.:160; I.V.:10] Out: -  Intake/Output this shift: No intake/output data recorded.  Gen: NAD, comfortable CV: RRR Pulm: Normal work of breathing Abd: Soft, appropriate tenderness around incision, nondistended Ext: SCDs in place  Lab Results: CBC  Recent Labs    07/09/23 0510 07/10/23 0544  WBC 7.8 7.6  HGB 7.9* 8.4*  HCT 23.7* 26.3*  PLT 281 296   BMET Recent Labs    07/09/23 0510 07/10/23 0544  NA 139 138  K 2.7* 3.7  CL 108 107  CO2 23 23  GLUCOSE 82 81  BUN 11 8  CREATININE 0.60 0.64  CALCIUM  7.0* 6.9*   PT/INR No results for input(s): "LABPROT", "INR" in the last 72 hours. ABG No results for input(s): "PHART", "HCO3" in the last 72 hours.  Invalid input(s): "PCO2", "PO2"  Studies/Results:  Anti-infectives: Anti-infectives (From admission, onward)    Start     Dose/Rate Route Frequency Ordered Stop   07/07/23 0730  cefoTEtan  (CEFOTAN ) 2 g in sodium chloride  0.9 % 100 mL IVPB  Status:  Discontinued        2 g 200 mL/hr over 30 Minutes Intravenous On call to O.R. 07/07/23 0720 07/07/23 0721   07/07/23 0645  cefoTEtan  (CEFOTAN ) 2 g in sodium chloride  0.9 % 100 mL IVPB        2 g 200 mL/hr over 30 Minutes Intravenous  Once 07/07/23 9629 07/07/23 1543        Assessment/Plan: Patient Active Problem List   Diagnosis Date Noted   S/P closure of  ileostomy 07/07/2023   Hypotension 07/03/2023   Acute renal failure (ARF) (HCC) 07/02/2023   Acute URI 06/19/2023   Cough 05/15/2023   Acute bronchitis 05/15/2023   Ileostomy care (HCC) 05/03/2023   S/P partial colectomy 04/21/2023   Pain in the abdomen 02/06/2023   Genetic testing 01/24/2023   Abnormal PET scan of colon 12/07/2022   Rectal adenocarcinoma (HCC) 10/13/2022   Squamous cell lung cancer, right (HCC) 08/12/2022   Mixed hyperlipidemia 06/21/2022   Cramps, muscle, general 06/21/2022   Other fatigue 06/21/2022   Rib pain on right side 01/25/2022   Vitamin D  deficiency 01/25/2022   Thrush 01/25/2022   B12 deficiency 01/06/2022   Protein-calorie malnutrition, severe 09/07/2021   Dysphagia 09/05/2021   Prolonged QT interval 09/05/2021   Syncope, vasovagal 09/04/2021   Syncope 09/04/2021   Antineoplastic chemotherapy induced anemia 08/24/2021   Neutropenic fever (HCC)    HCAP (healthcare-associated pneumonia) 07/16/2021   Sepsis (HCC) 07/16/2021   Pancytopenia (HCC)    Port-A-Cath in place 07/07/2021   Anxiety associated with cancer diagnosis (HCC) 07/07/2021   Chemotherapy-induced neuropathy (HCC) 06/08/2021   Anxiety 06/08/2021   Hypomagnesemia 04/13/2021   Generalized body aches 03/11/2021   Neoplasm related pain 03/11/2021   Encounter for antineoplastic chemotherapy 03/11/2021   Encounter for monitoring cardiotoxic drug therapy 02/27/2021   Invasive ductal carcinoma of  right breast (HCC) 02/20/2021   Goals of care, counseling/discussion 02/20/2021   Metabolic acidosis 02/22/2020   DKA (diabetic ketoacidosis) (HCC) 02/21/2020   Elevated LFTs 02/21/2020   AKI (acute kidney injury) (HCC) 02/21/2020   Leukocytosis 02/21/2020   Hyponatremia 01/30/2017   Hypochloremia 01/30/2017   Hypokalemia 01/27/2017   Hyperglycemia 01/27/2017   Back pain 12/15/2016   Chronic lower back pain 12/15/2016   Elevated ferritin 11/07/2016   Aortic atherosclerosis (HCC) 10/24/2016    Fatty liver 10/24/2016   Thrombocytopenia (HCC) 10/24/2016   Medication monitoring encounter 11/19/2015   GERD (gastroesophageal reflux disease) 11/19/2015   Tobacco abuse 11/19/2015   Essential hypertension, benign 11/19/2015   s/p Procedure(s): TAKEDOWN OF LOOP ILEOSTOMY SIGMOIDOSCOPY, FLEXIBLE 07/07/2023  - Hypokalemia ~resolved; recheck bmp this am - Soft diet as tolerated - Ambulate/therapies - PPX: SQH, SCDs - Dispo: pending PT, possible discharge later today   LOS: 4 days   Beatris Lincoln, MD [facility]   ADDENDUM  Spoke with her mother over the phone whom spoke with patient on the phone. Has had some nausea today. All parties feel like keeping another day for monitoring would be best and I agree. Will reassess candidacy for discharge home tomorrow  Beatris Lincoln, MD [facility], [facility]

## 2023-07-12 ENCOUNTER — Other Ambulatory Visit (HOSPITAL_COMMUNITY): Payer: Self-pay

## 2023-07-12 ENCOUNTER — Other Ambulatory Visit: Payer: Self-pay | Admitting: Cardiology

## 2023-07-12 LAB — BASIC METABOLIC PANEL WITH GFR
Anion gap: 11 (ref 5–15)
BUN: <5 mg/dL — ABNORMAL LOW (ref 8–23)
CO2: 21 mmol/L — ABNORMAL LOW (ref 22–32)
Calcium: 6.6 mg/dL — ABNORMAL LOW (ref 8.9–10.3)
Chloride: 104 mmol/L (ref 98–111)
Creatinine, Ser: 0.35 mg/dL — ABNORMAL LOW (ref 0.44–1.00)
GFR, Estimated: >60 mL/min (ref >60)
Glucose, Bld: 108 mg/dL — ABNORMAL HIGH (ref 70–99)
Potassium: 3.6 mmol/L (ref 3.5–5.1)
Sodium: 136 mmol/L (ref 135–145)

## 2023-07-12 LAB — MAGNESIUM: Magnesium: 0.7 mg/dL — CL (ref 1.7–2.4)

## 2023-07-12 MED ORDER — HEPARIN SOD (PORK) LOCK FLUSH 100 UNIT/ML IV SOLN
500.0000 [IU] | INTRAVENOUS | Status: AC | PRN
  Administered 2023-07-12: 500 [IU]

## 2023-07-12 MED ORDER — MAGNESIUM SULFATE 2 GM/50ML IV SOLN
2.0000 g | Freq: Once | INTRAVENOUS | Status: AC
  Administered 2023-07-12: 2 g via INTRAVENOUS
  Filled 2023-07-12: qty 50

## 2023-07-12 NOTE — Progress Notes (Signed)
 Mobility Specialist - Progress Note   07/12/23 0919  Mobility  Activity Ambulated with assistance in hallway  Level of Assistance Modified independent, requires aide device or extra time  Assistive Device Front wheel walker  Distance Ambulated (ft) 250 ft  Activity Response Tolerated well  Mobility Referral Yes  Mobility visit 1 Mobility  Mobility Specialist Start Time (ACUTE ONLY) 0908  Mobility Specialist Stop Time (ACUTE ONLY) [id number]  Mobility Specialist Time Calculation (min) (ACUTE ONLY) 10 min   Pt received in bed and agreeable to mobility. Distance limited d/t pain. No complaints during session. Pt to bed after session with all needs met.   [facility]

## 2023-07-12 NOTE — Progress Notes (Signed)
 TOC meds in a secure bag delivered to room by this RN

## 2023-07-12 NOTE — Progress Notes (Signed)
 Received critical lab result Magnesium  level-0.7. Paged on call Dr Afton Horse.

## 2023-07-12 NOTE — Progress Notes (Signed)
 Subjective No acute events. Feeling much better today. No n/v. Bms have slowed and now 1-2x/day. Pain well controlled  Objective: Vital signs in last 24 hours: Temp:  [98 F (36.7 C)-98.4 F (36.9 C)] 98.4 F (36.9 C) (04/30 0523) Pulse Rate:  [83-88] 86 (04/30 0523) Resp:  [16-20] 19 (04/30 0523) BP: (91-116)/(58-76) 102/76 (04/30 0523) SpO2:  [95 %-96 %] 96 % (04/30 0523) Weight:  [61.2 kg] 61.2 kg (04/30 0500) Last BM Date : 07/11/23  Intake/Output from previous day: 04/29 0701 - 04/30 0700 In: 1886.4 [P.O.:630; I.V.:338.3; IV Piggyback:918.1] Out: 900 [Urine:900] Intake/Output this shift: Total I/O In: 240 [P.O.:240] Out: -   Gen: NAD, comfortable CV: RRR Pulm: Normal work of breathing Abd: Soft, appropriate tenderness around incision, nondistended. Former ileostomy site clean Ext: SCDs in place  Lab Results: CBC  Recent Labs    07/10/23 0544  WBC 7.6  HGB 8.4*  HCT 26.3*  PLT 296   BMET Recent Labs    07/11/23 0914 07/12/23 0511  NA 135 136  K 4.1 3.6  CL 103 104  CO2 19* 21*  GLUCOSE 76 108*  BUN 7* <5*  CREATININE 0.36* 0.35*  CALCIUM  7.2* 6.6*   PT/INR No results for input(s): "LABPROT", "INR" in the last 72 hours. ABG No results for input(s): "PHART", "HCO3" in the last 72 hours.  Invalid input(s): "PCO2", "PO2"  Studies/Results:  Anti-infectives: Anti-infectives (From admission, onward)    Start     Dose/Rate Route Frequency Ordered Stop   07/07/23 0730  cefoTEtan  (CEFOTAN ) 2 g in sodium chloride  0.9 % 100 mL IVPB  Status:  Discontinued        2 g 200 mL/hr over 30 Minutes Intravenous On call to O.R. 07/07/23 0720 07/07/23 0721   07/07/23 0645  cefoTEtan  (CEFOTAN ) 2 g in sodium chloride  0.9 % 100 mL IVPB        2 g 200 mL/hr over 30 Minutes Intravenous  Once 07/07/23 4098 07/07/23 1543        Assessment/Plan: Patient Active Problem List   Diagnosis Date Noted   S/P closure of ileostomy 07/07/2023   Hypotension 07/03/2023    Acute renal failure (ARF) (HCC) 07/02/2023   Acute URI 06/19/2023   Cough 05/15/2023   Acute bronchitis 05/15/2023   Ileostomy care (HCC) 05/03/2023   S/P partial colectomy 04/21/2023   Pain in the abdomen 02/06/2023   Genetic testing 01/24/2023   Abnormal PET scan of colon 12/07/2022   Rectal adenocarcinoma (HCC) 10/13/2022   Squamous cell lung cancer, right (HCC) 08/12/2022   Mixed hyperlipidemia 06/21/2022   Cramps, muscle, general 06/21/2022   Other fatigue 06/21/2022   Rib pain on right side 01/25/2022   Vitamin D  deficiency 01/25/2022   Thrush 01/25/2022   B12 deficiency 01/06/2022   Protein-calorie malnutrition, severe 09/07/2021   Dysphagia 09/05/2021   Prolonged QT interval 09/05/2021   Syncope, vasovagal 09/04/2021   Syncope 09/04/2021   Antineoplastic chemotherapy induced anemia 08/24/2021   Neutropenic fever (HCC)    HCAP (healthcare-associated pneumonia) 07/16/2021   Sepsis (HCC) 07/16/2021   Pancytopenia (HCC)    Port-A-Cath in place 07/07/2021   Anxiety associated with cancer diagnosis (HCC) 07/07/2021   Chemotherapy-induced neuropathy (HCC) 06/08/2021   Anxiety 06/08/2021   Hypomagnesemia 04/13/2021   Generalized body aches 03/11/2021   Neoplasm related pain 03/11/2021   Encounter for antineoplastic chemotherapy 03/11/2021   Encounter for monitoring cardiotoxic drug therapy 02/27/2021   Invasive ductal carcinoma of right breast (HCC) 02/20/2021  Goals of care, counseling/discussion 02/20/2021   Metabolic acidosis 02/22/2020   DKA (diabetic ketoacidosis) (HCC) 02/21/2020   Elevated LFTs 02/21/2020   AKI (acute kidney injury) (HCC) 02/21/2020   Leukocytosis 02/21/2020   Hyponatremia 01/30/2017   Hypochloremia 01/30/2017   Hypokalemia 01/27/2017   Hyperglycemia 01/27/2017   Back pain 12/15/2016   Chronic lower back pain 12/15/2016   Elevated ferritin 11/07/2016   Aortic atherosclerosis (HCC) 10/24/2016   Fatty liver 10/24/2016   Thrombocytopenia  (HCC) 10/24/2016   Medication monitoring encounter 11/19/2015   GERD (gastroesophageal reflux disease) 11/19/2015   Tobacco abuse 11/19/2015   Essential hypertension, benign 11/19/2015   s/p Procedure(s): TAKEDOWN OF LOOP ILEOSTOMY SIGMOIDOSCOPY, FLEXIBLE 07/07/2023  - Hypokalemia ~resolved - hypomagnesemia - replaced - Soft diet as tolerated - Ambulate/therapies - PPX: SQH, SCDs - Dispo: cleared PT  She is comfortable with and stable for discharge home today   LOS: 5 days   Beatris Lincoln, MD [facility]

## 2023-07-12 NOTE — Progress Notes (Signed)
 Patient discharged home, Port-a-cath de accessed by IV team prior to discharge, discharge paperwork provided and explained to patient, patient verbalized understanding, awaiting ordered medications to be delivered at the bedside prior to patient discharge.

## 2023-07-12 NOTE — Discharge Summary (Signed)
 Patient ID: MORGANN TELEP MRN: [id number] DOB/AGE: Sep 14, 1960 63 y.o.  Admit date: 07/07/2023 Discharge date: 07/12/2023  Discharge Diagnoses Patient Active Problem List   Diagnosis Date Noted   S/P closure of ileostomy 07/07/2023   Hypotension 07/03/2023   Acute renal failure (ARF) (HCC) 07/02/2023   Acute URI 06/19/2023   Cough 05/15/2023   Acute bronchitis 05/15/2023   Ileostomy care (HCC) 05/03/2023   S/P partial colectomy 04/21/2023   Pain in the abdomen 02/06/2023   Genetic testing 01/24/2023   Abnormal PET scan of colon 12/07/2022   Rectal adenocarcinoma (HCC) 10/13/2022   Squamous cell lung cancer, right (HCC) 08/12/2022   Mixed hyperlipidemia 06/21/2022   Cramps, muscle, general 06/21/2022   Other fatigue 06/21/2022   Rib pain on right side 01/25/2022   Vitamin D  deficiency 01/25/2022   Thrush 01/25/2022   B12 deficiency 01/06/2022   Protein-calorie malnutrition, severe 09/07/2021   Dysphagia 09/05/2021   Prolonged QT interval 09/05/2021   Syncope, vasovagal 09/04/2021   Syncope 09/04/2021   Antineoplastic chemotherapy induced anemia 08/24/2021   Neutropenic fever (HCC)    HCAP (healthcare-associated pneumonia) 07/16/2021   Sepsis (HCC) 07/16/2021   Pancytopenia (HCC)    Port-A-Cath in place 07/07/2021   Anxiety associated with cancer diagnosis (HCC) 07/07/2021   Chemotherapy-induced neuropathy (HCC) 06/08/2021   Anxiety 06/08/2021   Hypomagnesemia 04/13/2021   Generalized body aches 03/11/2021   Neoplasm related pain 03/11/2021   Encounter for antineoplastic chemotherapy 03/11/2021   Encounter for monitoring cardiotoxic drug therapy 02/27/2021   Invasive ductal carcinoma of right breast (HCC) 02/20/2021   Goals of care, counseling/discussion 02/20/2021   Metabolic acidosis 02/22/2020   DKA (diabetic ketoacidosis) (HCC) 02/21/2020   Elevated LFTs 02/21/2020   AKI (acute kidney injury) (HCC) 02/21/2020   Leukocytosis 02/21/2020   Hyponatremia  01/30/2017   Hypochloremia 01/30/2017   Hypokalemia 01/27/2017   Hyperglycemia 01/27/2017   Back pain 12/15/2016   Chronic lower back pain 12/15/2016   Elevated ferritin 11/07/2016   Aortic atherosclerosis (HCC) 10/24/2016   Fatty liver 10/24/2016   Thrombocytopenia (HCC) 10/24/2016   Medication monitoring encounter 11/19/2015   GERD (gastroesophageal reflux disease) 11/19/2015   Tobacco abuse 11/19/2015   Essential hypertension, benign 11/19/2015    Consultants None  Procedures OR 07/07/23 - takedown of loop ileostomy  Hospital Course: She was admitted postoperatively and recovered appropriately albeit somewhat protracted.  She had steady return of bowel function.  She began having loose stool initially.  She also had hypokalemia which was managed.  She was also treated for hypomagnesemia.  Her diet was gradually advanced.  Which she tolerated well.  On 07/12/2023, she is tolerating regular diet, frequency of bowel movements has improved dramatically to where she is having 1 or 2 bowel movements per day, minimal abdominal discomfort primarily at her incision site.  We discussed general expectations postoperatively and things to watch out for.  All of her questions were answered to her satisfaction, she expressed understanding and was eager to go home.  Postoperative follow-up in our office has been arranged.  She has been encouraged to call with any questions or concerns in the interim.  Allergies as of 07/12/2023   No Known Allergies      Medication List     STOP taking these medications    FiberCon 625 MG tablet Generic drug: polycarbophil   midodrine  5 MG tablet Commonly known as: PROAMATINE        TAKE these medications    budesonide -formoterol  160-4.5 MCG/ACT inhaler Commonly  known as: Symbicort  Inhale 2 puffs into the lungs 2 (two) times daily. What changed:  how much to take when to take this reasons to take this   fluticasone  50 MCG/ACT nasal spray Commonly  known as: FLONASE  Place 1 spray into both nostrils daily as needed for rhinitis or allergies.   guaiFENesin  600 MG 12 hr tablet Commonly known as: MUCINEX  Take 1 tablet (600 mg total) by mouth 2 (two) times daily for 7 days.   ibuprofen  200 MG tablet Commonly known as: ADVIL  Take 200-400 mg by mouth every 6 (six) hours as needed for headache.   lidocaine -prilocaine  cream Commonly known as: EMLA  APPLY TO AFFECTED AREA ONCE   loperamide  2 MG capsule Commonly known as: IMODIUM  Take 1 capsule (2 mg total) by mouth 2 (two) times daily.   magnesium  oxide 400 (240 Mg) MG tablet Commonly known as: MAG-OX Take 800 mg by mouth daily.   naloxone 4 MG/0.1ML Liqd nasal spray kit Commonly known as: NARCAN Place 0.4 mg into the nose once as needed (as directed).   omeprazole  40 MG capsule Commonly known as: PRILOSEC Take 1 capsule (40 mg total) by mouth daily.   oxyCODONE  5 MG immediate release tablet Commonly known as: Roxicodone  Take 1 tablet (5 mg total) by mouth every 6 (six) hours as needed for up to 5 days (postop pain not controlled with tylenol  and ibuprofen  first).   Ventolin  HFA 108 (90 Base) MCG/ACT inhaler Generic drug: albuterol  INHALE 1 PUFF BY MOUTH EVERY 6 HOURS AS NEEDED FOR SHORTNESS OF BREATH FOR WHEEZING What changed: See the new instructions.       ASK your doctor about these medications    sodium bicarbonate  650 MG tablet Take 1 tablet (650 mg total) by mouth 2 (two) times daily for 3 days. Ask about: Should I take this medication?          Follow-up Information     Melvenia Stabs, MD Follow up on 07/24/2023.   Specialties: General Surgery, Colon and Rectal Surgery Why: Please arrive by 10:00 am Contact information: [address] [place] [address] [phone]                 Renford Cartwright. Camilo Cella, M.D. [facility]

## 2023-07-13 ENCOUNTER — Other Ambulatory Visit: Payer: Self-pay | Admitting: Cardiology

## 2023-07-20 ENCOUNTER — Ambulatory Visit: Payer: Medicaid Other | Admitting: Radiation Oncology

## 2023-07-24 ENCOUNTER — Inpatient Hospital Stay: Attending: Oncology

## 2023-07-24 ENCOUNTER — Encounter: Payer: Self-pay | Admitting: Oncology

## 2023-07-24 ENCOUNTER — Inpatient Hospital Stay (HOSPITAL_BASED_OUTPATIENT_CLINIC_OR_DEPARTMENT_OTHER): Admitting: Oncology

## 2023-07-24 VITALS — BP 150/81 | HR 88 | Temp 98.8°F | Resp 18 | Wt 113.7 lb

## 2023-07-24 DIAGNOSIS — C20 Malignant neoplasm of rectum: Secondary | ICD-10-CM

## 2023-07-24 DIAGNOSIS — C50911 Malignant neoplasm of unspecified site of right female breast: Secondary | ICD-10-CM | POA: Diagnosis not present

## 2023-07-24 DIAGNOSIS — Z95828 Presence of other vascular implants and grafts: Secondary | ICD-10-CM

## 2023-07-24 DIAGNOSIS — F17210 Nicotine dependence, cigarettes, uncomplicated: Secondary | ICD-10-CM | POA: Insufficient documentation

## 2023-07-24 DIAGNOSIS — C3491 Malignant neoplasm of unspecified part of right bronchus or lung: Secondary | ICD-10-CM

## 2023-07-24 DIAGNOSIS — Z720 Tobacco use: Secondary | ICD-10-CM | POA: Diagnosis not present

## 2023-07-24 DIAGNOSIS — Z17421 Hormone receptor negative with human epidermal growth factor receptor 2 negative status: Secondary | ICD-10-CM | POA: Diagnosis not present

## 2023-07-24 DIAGNOSIS — K769 Liver disease, unspecified: Secondary | ICD-10-CM | POA: Insufficient documentation

## 2023-07-24 LAB — CBC WITH DIFFERENTIAL (CANCER CENTER ONLY)
Abs Immature Granulocytes: 0.17 10*3/uL — ABNORMAL HIGH (ref 0.00–0.07)
Basophils Absolute: 0 10*3/uL (ref 0.0–0.1)
Basophils Relative: 0 %
Eosinophils Absolute: 0 10*3/uL (ref 0.0–0.5)
Eosinophils Relative: 0 %
HCT: 31.2 % — ABNORMAL LOW (ref 36.0–46.0)
Hemoglobin: 10.1 g/dL — ABNORMAL LOW (ref 12.0–15.0)
Immature Granulocytes: 1 %
Lymphocytes Relative: 13 %
Lymphs Abs: 1.6 10*3/uL (ref 0.7–4.0)
MCH: 30.2 pg (ref 26.0–34.0)
MCHC: 32.4 g/dL (ref 30.0–36.0)
MCV: 93.4 fL (ref 80.0–100.0)
Monocytes Absolute: 0.6 10*3/uL (ref 0.1–1.0)
Monocytes Relative: 5 %
Neutro Abs: 9.5 10*3/uL — ABNORMAL HIGH (ref 1.7–7.7)
Neutrophils Relative %: 81 %
Platelet Count: 533 10*3/uL — ABNORMAL HIGH (ref 150–400)
RBC: 3.34 MIL/uL — ABNORMAL LOW (ref 3.87–5.11)
RDW: 15.9 % — ABNORMAL HIGH (ref 11.5–15.5)
WBC Count: 11.9 10*3/uL — ABNORMAL HIGH (ref 4.0–10.5)
nRBC: 0.2 % (ref 0.0–0.2)

## 2023-07-24 LAB — CMP (CANCER CENTER ONLY)
ALT: 11 U/L (ref 0–44)
AST: 16 U/L (ref 15–41)
Albumin: 3.8 g/dL (ref 3.5–5.0)
Alkaline Phosphatase: 71 U/L (ref 38–126)
Anion gap: 12 (ref 5–15)
BUN: 10 mg/dL (ref 8–23)
CO2: 22 mmol/L (ref 22–32)
Calcium: 8.7 mg/dL — ABNORMAL LOW (ref 8.9–10.3)
Chloride: 103 mmol/L (ref 98–111)
Creatinine: 0.69 mg/dL (ref 0.44–1.00)
GFR, Estimated: >60 mL/min (ref >60)
Glucose, Bld: 121 mg/dL — ABNORMAL HIGH (ref 70–99)
Potassium: 3.6 mmol/L (ref 3.5–5.1)
Sodium: 137 mmol/L (ref 135–145)
Total Bilirubin: 0.4 mg/dL (ref 0.0–1.2)
Total Protein: 6.7 g/dL (ref 6.5–8.1)

## 2023-07-24 MED ORDER — SODIUM CHLORIDE 0.9% FLUSH
10.0000 mL | Freq: Once | INTRAVENOUS | Status: AC
  Administered 2023-07-24: 10 mL via INTRAVENOUS
  Filled 2023-07-24: qty 10

## 2023-07-24 MED ORDER — HEPARIN SOD (PORK) LOCK FLUSH 100 UNIT/ML IV SOLN
500.0000 [IU] | Freq: Once | INTRAVENOUS | Status: AC
  Administered 2023-07-24: 500 [IU] via INTRAVENOUS
  Filled 2023-07-24: qty 5

## 2023-07-24 NOTE — Assessment & Plan Note (Signed)
Smoke cessation was discussed with patient.

## 2023-07-24 NOTE — Assessment & Plan Note (Signed)
 Clinical stage III triple negative right breast cancer. cT3 cN0-1 S/p  neoadjuvant carboplatin Taxol Keytruda followed by [facility] with Keytruda x 3.   4th cycle of AC with Rande Lawman was not given due to poor tolerance. Status post right mastectomy with sentinel lymph node biopsy. ypT2 ypN0, residual disease, Patient declines any adjuvant chemotherapy or immunotherapy Continue clinical surveillance.  Patient is overdue for left diagnostic mammogram and ultrasound.  Will obtain. genetic counseling showed DICER VUS

## 2023-07-24 NOTE — Assessment & Plan Note (Signed)
 She will get [name] flush Q6-8 weeks

## 2023-07-24 NOTE — Progress Notes (Signed)
 Hematology/Oncology Progress note [phone] [phone]       CHIEF COMPLAINTS/REASON FOR VISIT:  Follow-up for triple negative right breast cancer, squamous cell lung cancer, rectal Stage 0 adenocarcinoma   ASSESSMENT & PLAN:   Cancer Staging  Invasive ductal carcinoma of right breast (HCC) Staging form: Breast, AJCC 8th Edition - Pathologic stage from 10/19/2021: No Stage Recommended (ypT2, pN0, cM0, G3, ER-, PR-, HER2-) - Signed by Timmy Forbes, MD on 11/03/2021  Rectal adenocarcinoma [facility]) Staging form: Colon and Rectum, AJCC 8th Edition - Clinical stage from 05/15/2023: Stage 0 (cTis, cN0, cM0) - Signed by Timmy Forbes, MD on 05/15/2023  Squamous cell lung cancer, right [facility]) Staging form: Lung, AJCC 8th Edition - Clinical: cT1, cN0, cM0 - Signed by Timmy Forbes, MD on 01/10/2023   Invasive ductal carcinoma of right breast (HCC) Clinical stage III triple negative right breast cancer. cT3 cN0-1 S/p  neoadjuvant carboplatin  Taxol  Keytruda  followed by [facility] with Keytruda  x 3.   4th cycle of AC with keytruda  was not given due to poor tolerance. Status post right mastectomy with sentinel lymph node biopsy. ypT2 ypN0, residual disease, Patient declines any adjuvant chemotherapy or immunotherapy Continue clinical surveillance.  Patient is overdue for left diagnostic mammogram and ultrasound.  Will obtain. genetic counseling showed DICER VUS  Rectal adenocarcinoma (HCC) pTis N0 disease.  Status post lower anterior resection, s/p colostomy reversal.  No need for adjuvant radiation or chemotherapy.  Follow-up with surgery.  Squamous cell lung cancer, right (HCC) Bronchoscopy biopsy of right upper lobe nodule showed squamous cell carcinoma. Other lung nodule was not able to be biopsied due to location and extract for pneumothorax. S/p  lung SBRT.  Repeat CT chest   Tobacco abuse Smoke cessation was discussed with patient.   Port-A-Cath in place She will get [name] flush  Q6-8 weeks   Liver lesion On recent non contrast CT.  Obtain CT abdomen pelvis with contrast      Orders Placed This Encounter  Procedures   CT CHEST ABDOMEN PELVIS W CONTRAST    Standing Status:   Future    Expected Date:   07/31/2023    Expiration Date:   07/23/2024    If indicated for the ordered procedure, I authorize the administration of contrast media per Radiology protocol:   Yes    Does the patient have a contrast media/X-ray dye allergy?:   No    Preferred imaging location?:   [facility]    If indicated for the ordered procedure, I authorize the administration of oral contrast media per Radiology protocol:   Yes   Follow-up after CT and mammogram All questions were answered. The patient knows to call the clinic with any problems, questions or concerns.  Timmy Forbes, MD, PhD [facility] Hematology Oncology 07/24/2023        HISTORY OF PRESENTING ILLNESS:   Yvonne Scott is a  63 y.o.  female presents for follow-up of triple negative breast cancer, lung squamous cancer and rectal cancer.  Oncology History  Invasive ductal carcinoma of right breast (HCC)  11/11/2020 Genetic Testing   Genetic testing done at [facility].  Per note, negative.   02/20/2021 Initial Diagnosis   Invasive ductal carcinoma of right breast (HCC)\  -August 2022, patient was diagnosed with right breast stage IIIb cT3 N0M0, grade 2, ER negative, PR negative HER2 negative breast cancer.  Patient self palpated breast mass many years ago.  10/21/2020 diagnostic mammogram and ultrasound confirmed presence of mass and a  subsequent biopsy revealed right breast triple negative cancer. There was plan for patient to start with neoadjuvant chemotherapy followed by surgery and radiation.  Unfortunately patient never started treatment.    Medi port was placed at [facility].  Established care with me on 02/19/2021    02/24/2021 Imaging   CT chest abdomen pelvis showed large right breast mass, 5.5 x 4.8 cm with  small right axillary lymph node with variable degrees of enhancement potentially involved but not specific based on size.  This tracked down to retropectoral nodal stations largest lymph node along the lateral margin of the pectoralis major.  Tiny pulmonary nodules in the chest as discussed.  These are nonspecific but would consider short interval follow-up to assess for any changes.  No evidence of metastatic disease involving the abdomen or pelvis.  Aortic atherosclerosis.   02/25/2021 Echocardiogram   echocardiogram showed LVEF 65 to 70%.  Patient has been to chemotherapy class and understands antiemetics instructions   03/11/2021 - 08/26/2021 Chemotherapy   BREAST Pembrolizumab  (200) D1 + Carboplatin  (5) D1 + Paclitaxel  (80) D1,8,15 q21d X 4 cycles, followed by Pembrolizumab  (200) D1 + AC D1 q21d x 3 cycles. 4th cycle was omitted due to poor tolerance.     03/16/2021 Imaging   bone scan showed no evidence of osseous metastatic breast cancer, asymmetric osseous uptake on the right at L5-S1, corresponding to degenerative disc disease on recent CT.  Asymmetric soft tissue uptake in the right breast   07/13/2021 Echocardiogram   Echocardiogram showed LVEF 60-65%.  Grade 1 diastolic dysfunction.  Refer details to echocardiogram reports.   10/19/2021 Surgery   Patient underwent right modified radical mastectomy  Pathology showed invasive mammary carcinoma, no special type, multifocal, with focal chondroid stroma.  Clip and biopsy sites identified.  12 lymph nodes negative for malignancy.  2 lymph nodes displaying dense fibrous scarring, compatible with pathological complete response.  Benign nipple/areola.   Grade 3, LVI not identified, DCIS not identified.  All margins negative for invasive carcinoma.   ypT2 ypN0  Comment Due to the presence of dense fibrosis and treatment effect in two of the sampled lymph nodes, pancytokeratin stains were performed on all submitted lymph nodes.  All lymph nodes are  negative for residual metastatic carcinoma.  A separate focus of invasive carcinoma, measuring approximately 11 mm in greatest extent, and histologically similar to the primary tumor, is identified within sampling of the fibrous tissue adjacent to the primary tumor.    10/19/2021 Cancer Staging   Staging form: Breast, AJCC 8th Edition - Pathologic stage from 10/19/2021: No Stage Recommended (ypT2, pN0, cM0, G3, ER-, PR-, HER2-) - Signed by Timmy Forbes, MD on 11/03/2021 Stage prefix: Post-therapy Response to neoadjuvant therapy: Partial response Multigene prognostic tests performed: None Histologic grading system: 3 grade system   05/27/2022 Imaging   CT chest abdomen pelvis with contrast showed 1. Interval right mastectomy with resolution of suspicious right axillary nodes. 2. Enlargement of a 5 mm right lower lobe pulmonary nodule. Development of a 5 mm left upper lobe pulmonary nodule. Cannot exclude early metastatic disease. Consider chest CT follow-up at 3 months. 3. Otherwise, no evidence of metastatic disease in the chest, abdomen, or pelvis. 4. Age advanced coronary artery atherosclerosis. Recommend assessment of coronary risk factors. 5. Aortic atherosclerosis (ICD10-I70.0) and emphysema (ICD10-J43.9). 6. Mild enlargement of the common duct, which is now minimally dilated for age. Correlate with bilirubin levels. If elevated,consider ERCP.   09/09/2022 Imaging   CT chest without contrast showed  1. Interval growth of three solid bilateral pulmonary nodules since 05/25/2022 chest CT, largest 0.9 cm in the anteromedial left upper lobe, highly suspicious for pulmonary metastases. 2. No thoracic adenopathy. 3. Three-vessel coronary atherosclerosis. 4. Aortic Atherosclerosis (ICD10-I70.0) and Emphysema (ICD10-J43.9).     09/29/2022 Imaging   PET scan shows 1. Hypermetabolic bilateral pulmonary nodules, consistent with metastasis. 2. No other evidence of tracer avid metastatic disease,  status post right mastectomy. 3. Hypermetabolism with suggestion of soft tissue fullness in the mid rectum, suspicious for polyp or carcinoma. Consider further evaluation with sigmoidoscopy. 4. Incidental findings, including: Aortic atherosclerosis (ICD10-I70.0), coronary artery atherosclerosis and emphysema (ICD10-J43.9).   Squamous cell lung cancer, right (HCC)  08/12/2022 Initial Diagnosis   Squamous cell lung cancer, right (HCC)   01/10/2023 Cancer Staging   Staging form: Lung, AJCC 8th Edition - Clinical: cT1, cN0, cM0 - Signed by Timmy Forbes, MD on 01/10/2023 Stage prefix: Initial diagnosis   Rectal adenocarcinoma (HCC)  10/13/2022 Initial Diagnosis   Rectal adenocarcinoma (HCC)  patient has large polyp and biopsy showed rectal adenocarcinoma-at least intramucosal. She was also recommended to have bronchoscopy ASAP for further evaluation of the lung nodules.  Patient adamantly declined pulmonology workup concurrently with colonoscopy.  She eventually underwent pulmonology workup with bronchoscopy biopsy of right upper lobe nodule and the results showed squamous cell carcinoma.   lingula nodule was not biopsied due to location and threat for pneumothorax.  Denies hematochezia,  black tarry stool or abdominal pain, change of bowel habits.   01/16/2023 Imaging   MRI pelvis without contrast showed 4.1 cm upper/mid rectal mass, as above.   Rectal adenocarcinoma T stage: T2.   Rectal adenocarcinoma N stage: N0.   Distance from tumor to the internal anal sphincter is 7.0 cm.   04/21/2023 Surgery   Patient underwent distal sigmoid colon and rectum low anterior resection Pathology showed Tubulovillous adenoma with high-grade dysplasia/intramucosal carcinoma  (pTis)  Negative for invasion  Tumor measures 2.6 x 2.3 x 2.1 cm  Margins free of adenomatous change and carcinoma  Eight benign perirectal lymph nodes (0/8, pN0  Final distal margin benign: Negative for dysplasia and carcinoma.     05/15/2023 Cancer Staging   Staging form: Colon and Rectum, AJCC 8th Edition - Clinical stage from 05/15/2023: Stage 0 (cTis, cN0, cM0) - Signed by Timmy Forbes, MD on 05/15/2023 Stage prefix: Initial diagnosis Total positive nodes: 0    07/16/2021-07/19/2021, patient was admitted for HCAP patient was treated with antibiotics 09/04/2021 - 09/08/2021 Patient was admitted due to pancytopenia, thrush electrolyte abnormality.     INTERVAL HISTORY Yvonne Scott is a 63 y.o. female who has above history reviewed by me today presents for follow up visit for management of triple negative breast cancer, lung squamous cell carcinoma, stage 0 rectal adenocarcinoma   Patient continues to smoke on some days.   S/p diverting loop ileostomy take down    Review of Systems  Constitutional:  Positive for fatigue. Negative for appetite change, chills and fever.  HENT:   Negative for hearing loss, mouth sores and voice change.   Eyes:  Negative for eye problems.  Respiratory:  Negative for chest tightness and cough.   Cardiovascular:  Negative for chest pain.  Gastrointestinal:  Negative for abdominal distention, abdominal pain, blood in stool, diarrhea and nausea.  Endocrine: Negative for hot flashes.  Genitourinary:  Negative for difficulty urinating, dysuria and frequency.   Musculoskeletal:  Positive for arthralgias and back pain.  Skin:  Negative for  itching and rash.  Neurological:  Positive for numbness. Negative for extremity weakness.  Hematological:  Negative for adenopathy.  Psychiatric/Behavioral:  Negative for confusion. The patient is nervous/anxious.     MEDICAL HISTORY:  Past Medical History:  Diagnosis Date   Anxiety    Aortic atherosclerosis (HCC) 10/24/2016   Breast cancer (HCC)    colon cancer  and rectal   COPD (chronic obstructive pulmonary disease) (HCC)    Depression    Dyspnea    GERD (gastroesophageal reflux disease)    Hypokalemia    Hypomagnesemia    Invasive ductal  carcinoma of right breast (HCC)    Metabolic acidosis    Personal history of chemotherapy    Pneumonia    Severe protein-calorie malnutrition (HCC)    Thrombocytopenia (HCC)     SURGICAL HISTORY: Past Surgical History:  Procedure Laterality Date   BREAST BIOPSY Right 2022   BRONCHIAL BIOPSY  12/29/2022   Procedure: BRONCHIAL BIOPSIES;  Surgeon: Vergia Glasgow, MD;  Location: [facility];  Service: Pulmonary;;   BRONCHIAL NEEDLE ASPIRATION BIOPSY  12/29/2022   Procedure: BRONCHIAL NEEDLE ASPIRATION BIOPSIES;  Surgeon: Vergia Glasgow, MD;  Location: [facility];  Service: Pulmonary;;   COLONOSCOPY WITH PROPOFOL  N/A 12/07/2022   Procedure: COLONOSCOPY WITH PROPOFOL ;  Surgeon: Selena Daily, MD;  Location: [facility];  Service: Gastroenterology;  Laterality: N/A;   COLOSTOMY TAKEDOWN N/A 07/07/2023   Procedure: TAKEDOWN OF LOOP ILEOSTOMY;  Surgeon: Melvenia Stabs, MD;  Location: [facility];  Service: General;  Laterality: N/A;  TAKEDOWN OF LOOP ILESTOMY   FLEXIBLE SIGMOIDOSCOPY N/A 04/21/2023   Procedure: FLEXIBLE SIGMOIDOSCOPY, ICG PERFUSION;  Surgeon: Melvenia Stabs, MD;  Location: [facility];  Service: General;  Laterality: N/A;   FLEXIBLE SIGMOIDOSCOPY N/A 07/07/2023   Procedure: Marlynn Singer;  Surgeon: Melvenia Stabs, MD;  Location: [facility];  Service: General;  Laterality: N/A;  DIAGNOSTIC FLEXIBLE SIGMOIDOSCOPY   FOOT SURGERY Left    little toe correction   MASTECTOMY W/ SENTINEL NODE BIOPSY Right 10/19/2021   Procedure: MASTECTOMY WITH SENTINEL LYMPH NODE BIOPSY ( modified radical);  Surgeon: Eldred Grego, MD;  Location: [facility];  Service: General;  Laterality: Right;   POLYPECTOMY  12/07/2022   Procedure: POLYPECTOMY;  Surgeon: Selena Daily, MD;  Location: [facility];  Service: Gastroenterology;;   PORTA CATH INSERTION  01/19/2021   SUBMUCOSAL TATTOO INJECTION  12/07/2022   Procedure: SUBMUCOSAL TATTOO INJECTION;  Surgeon: Selena Daily, MD;  Location: [facility];  Service: Gastroenterology;;   VIDEO BRONCHOSCOPY WITH ENDOBRONCHIAL ULTRASOUND N/A 12/29/2022   Procedure: VIDEO BRONCHOSCOPY WITH ENDOBRONCHIAL ULTRASOUND;  Surgeon: Vergia Glasgow, MD;  Location: [facility];  Service: Pulmonary;  Laterality: N/A;   XI ROBOTIC ASSISTED LOWER ANTERIOR RESECTION N/A 04/21/2023   Procedure: XI ROBOTIC ASSISTED LOWER ANTERIOR RESECTION, ILEOSTOMY CREATION;  Surgeon: Melvenia Stabs, MD;  Location: [facility];  Service: General;  Laterality: N/A;    SOCIAL HISTORY: Social History   Socioeconomic History   Marital status: Widowed    Spouse name: Not on file   Number of children: 1   Years of education: Not on file   Highest education level: Not on file  Occupational History   Not on file  Tobacco Use   Smoking status: Every Day    Types: Cigarettes   Smokeless tobacco: Never   Tobacco comments:    3-4 cigarettes a day  Vaping Use   Vaping status: Never Used  Substance and Sexual Activity   Alcohol  use: Never    Alcohol/week: 3.0 standard drinks of alcohol    Types: 3 Standard drinks or equivalent per week   Drug use: Never   Sexual activity: Not Currently    Birth control/protection: None  Other Topics Concern   Not on file  Social History Narrative   Lives with mother   Social Drivers of Health   Financial Resource Strain: Low Risk  (01/06/2022)   Overall Financial Resource Strain (CARDIA)    Difficulty of Paying Living Expenses: Not very hard  Food Insecurity: No Food Insecurity (07/09/2023)   Hunger Vital Sign    Worried About Running Out of Food in the Last Year: Never true    Ran Out of Food in the Last Year: Never true  Transportation Needs: No Transportation Needs (07/09/2023)   PRAPARE - Administrator, Civil Service (Medical): No    Lack of Transportation (Non-Medical): No  Physical Activity: Inactive (01/06/2022)   Exercise Vital Sign    Days of Exercise per Week: 0 days     Minutes of Exercise per Session: 0 min  Stress: Stress Concern Present (01/06/2022)   [employer] of Occupational Health - Occupational Stress Questionnaire    Feeling of Stress : Rather much  Social Connections: Moderately Isolated (07/01/2023)   Social Connection and Isolation Panel [NHANES]    Frequency of Communication with Friends and Family: Three times a week    Frequency of Social Gatherings with Friends and Family: Three times a week    Attends Religious Services: 1 to 4 times per year    Active Member of Clubs or Organizations: No    Attends Banker Meetings: Never    Marital Status: Widowed  Intimate Partner Violence: Not At Risk (07/09/2023)   Humiliation, Afraid, Rape, and Kick questionnaire    Fear of Current or Ex-Partner: No    Emotionally Abused: No    Physically Abused: No    Sexually Abused: No    FAMILY HISTORY: Family History  Problem Relation Age of Onset   Melanoma Father 65   Cirrhosis Maternal Grandfather    Breast cancer Paternal Grandmother     ALLERGIES:  has no known allergies.  MEDICATIONS:  Current Outpatient Medications  Medication Sig Dispense Refill   budesonide -formoterol  (SYMBICORT ) 160-4.5 MCG/ACT inhaler Inhale 2 puffs into the lungs 2 (two) times daily. (Patient taking differently: Inhale 1 puff into the lungs daily as needed (SOB, wheezing).) 1 each 12   fluticasone  (FLONASE ) 50 MCG/ACT nasal spray Place 1 spray into both nostrils daily as needed for rhinitis or allergies.     ibuprofen  (ADVIL ) 200 MG tablet Take 200-400 mg by mouth every 6 (six) hours as needed for headache.     lidocaine -prilocaine  (EMLA ) cream APPLY TO AFFECTED AREA ONCE 30 g 0   loperamide  (IMODIUM ) 2 MG capsule Take 1 capsule (2 mg total) by mouth 2 (two) times daily. 120 capsule 0   magnesium  oxide (MAG-OX) 400 (240 Mg) MG tablet Take 800 mg by mouth daily.     omeprazole  (PRILOSEC) 40 MG capsule Take 1 capsule (40 mg total) by mouth daily. 90  capsule 0   VENTOLIN  HFA 108 (90 Base) MCG/ACT inhaler INHALE 1 PUFF BY MOUTH EVERY 6 HOURS AS NEEDED FOR SHORTNESS OF BREATH FOR WHEEZING (Patient taking differently: Inhale 1-2 puffs into the lungs every 6 (six) hours as needed for shortness of breath or wheezing.) 18 g 0   naloxone (NARCAN) nasal spray 4 mg/0.1 mL Place  0.4 mg into the nose once as needed (as directed). (Patient not taking: Reported on 07/24/2023)     No current facility-administered medications for this visit.   Facility-Administered Medications Ordered in Other Visits  Medication Dose Route Frequency Provider Last Rate Last Admin   famotidine  (PEPCID ) 20-0.9 MG/50ML-% IVPB              PHYSICAL EXAMINATION: ECOG PERFORMANCE STATUS: 1 - Symptomatic but completely ambulatory Vitals:   07/24/23 1413  BP: (!) 150/81  Pulse: 88  Resp: 18  Temp: 98.8 F (37.1 C)   Filed Weights   07/24/23 1413  Weight: 113 lb 11.2 oz (51.6 kg)    Physical Exam Constitutional:      General: She is not in acute distress. HENT:     Head: Normocephalic and atraumatic.  Eyes:     General: No scleral icterus. Cardiovascular:     Rate and Rhythm: Normal rate.  Pulmonary:     Effort: Pulmonary effort is normal. No respiratory distress.     Comments: Decreased breath sound.  Abdominal:     General: Bowel sounds are normal. There is no distension.     Palpations: Abdomen is soft.  Musculoskeletal:        General: No deformity. Normal range of motion.     Cervical back: Normal range of motion and neck supple.  Skin:    Findings: No erythema or rash.  Neurological:     Mental Status: She is alert and oriented to person, place, and time. Mental status is at baseline.      LABORATORY DATA:  I have reviewed the data as listed    Latest Ref Rng & Units 07/24/2023    1:54 PM 07/10/2023    5:44 AM 07/09/2023    5:10 AM  CBC  WBC 4.0 - 10.5 K/uL 11.9  7.6  7.8   Hemoglobin 12.0 - 15.0 g/dL 32.2  8.4  7.9   Hematocrit 36.0 -  46.0 % 31.2  26.3  23.7   Platelets 150 - 400 K/uL 533  296  281       Latest Ref Rng & Units 07/24/2023    1:54 PM 07/12/2023    5:11 AM 07/11/2023    9:14 AM  CMP  Glucose 70 - 99 mg/dL 025  427  76   BUN 8 - 23 mg/dL 10  <5  7   Creatinine 0.44 - 1.00 mg/dL 0.62  3.76  2.83   Sodium 135 - 145 mmol/L 137  136  135   Potassium 3.5 - 5.1 mmol/L 3.6  3.6  4.1   Chloride 98 - 111 mmol/L 103  104  103   CO2 22 - 32 mmol/L 22  21  19    Calcium  8.9 - 10.3 mg/dL 8.7  6.6  7.2   Total Protein 6.5 - 8.1 g/dL 6.7     Total Bilirubin 0.0 - 1.2 mg/dL 0.4     Alkaline Phos 38 - 126 U/L 71     AST 15 - 41 U/L 16     ALT 0 - 44 U/L 11           RADIOGRAPHIC STUDIES: I have personally reviewed the radiological images as listed and agreed with the findings in the report. CT ABDOMEN PELVIS WO CONTRAST Result Date: 07/01/2023 CLINICAL DATA:  Increased ostomy output. Acute nonlocalized abdominal pain EXAM: CT ABDOMEN AND PELVIS WITHOUT CONTRAST TECHNIQUE: Multidetector CT imaging of the abdomen and pelvis was performed  following the standard protocol without IV contrast. RADIATION DOSE REDUCTION: This exam was performed according to the departmental dose-optimization program which includes automated exposure control, adjustment of the mA and/or kV according to patient size and/or use of iterative reconstruction technique. COMPARISON:  MRI pelvis 01/16/2023 and PET/CT 09/26/2022 and CT 05/25/2022 FINDINGS: Lower chest: Redemonstrated 8 mm nodule in the right lower lobe (series 4/image 6) unchanged from 12/28/2022. Increased size of the 7 mm nodule in the left lower lobe (4/2), previously 4 mm. No acute abnormality. Hepatobiliary: Hypoattenuating liver lesion along the falciform ligament measuring 1.9 cm (series 2/image 20) is new since PET/CT 09/26/2022. Additional new hypoattenuating liver lesion in the central right hepatic lobe (series 2/image 18) measuring 1.2 cm. These are concerning for metastases.  Pancreas: Unremarkable. Spleen: Unremarkable. Adrenals/Urinary Tract: Normal adrenal glands. No urinary calculi or hydronephrosis. Unremarkable bladder. Stomach/Bowel: Postoperative change of low anterior resection with right lower quadrant ileostomy. No bowel obstruction or bowel wall thickening. Stomach is within normal limits. Normal appendix. Vascular/Lymphatic: Aortic atherosclerosis. No enlarged abdominal or pelvic lymph nodes. Reproductive: Uterus and bilateral adnexa are unremarkable. Other: No free intraperitoneal fluid or air. Musculoskeletal: No acute fracture. IMPRESSION: 1. No acute abnormality in the abdomen or pelvis. 2. Postoperative change of low anterior resection with right lower quadrant ileostomy. No bowel obstruction. 3. Hypoattenuating liver lesions measuring up to 1.9 cm are new since PET/CT 09/26/2022 and concerning for metastatic disease. These are incompletely evaluated without IV contrast. Consider nonemergent CT abdomen pelvis with IV contrast or PET/CT for further evaluation. 4. Increased size of the 7 mm nodule in the left lower lobe, previously 4 mm. Unchanged 8 mm nodule in the right lower lobe. 5. Aortic Atherosclerosis (ICD10-I70.0). Electronically Signed   By: Rozell Cornet M.D.   On: 07/01/2023 19:00

## 2023-07-24 NOTE — Assessment & Plan Note (Addendum)
 Bronchoscopy biopsy of right upper lobe nodule showed squamous cell carcinoma. Other lung nodule was not able to be biopsied due to location and extract for pneumothorax. S/p  lung SBRT.  Repeat CT chest

## 2023-07-24 NOTE — Assessment & Plan Note (Signed)
 On recent non contrast CT.  Obtain CT abdomen pelvis with contrast

## 2023-07-24 NOTE — Assessment & Plan Note (Addendum)
 pTis N0 disease.  Status post lower anterior resection, s/p colostomy reversal.  No need for adjuvant radiation or chemotherapy.  Follow-up with surgery.

## 2023-07-25 LAB — CEA: CEA: 2.2 ng/mL (ref 0.0–4.7)

## 2023-07-31 ENCOUNTER — Ambulatory Visit
Admission: RE | Admit: 2023-07-31 | Discharge: 2023-07-31 | Disposition: A | Discharge status: Home / Self Care | Source: Ambulatory Visit | Attending: Oncology | Admitting: Oncology

## 2023-07-31 DIAGNOSIS — C3491 Malignant neoplasm of unspecified part of right bronchus or lung: Secondary | ICD-10-CM | POA: Diagnosis present

## 2023-07-31 DIAGNOSIS — C20 Malignant neoplasm of rectum: Secondary | ICD-10-CM | POA: Insufficient documentation

## 2023-07-31 DIAGNOSIS — C50911 Malignant neoplasm of unspecified site of right female breast: Secondary | ICD-10-CM | POA: Insufficient documentation

## 2023-07-31 MED ORDER — IOHEXOL 300 MG/ML  SOLN
100.0000 mL | Freq: Once | INTRAMUSCULAR | Status: AC | PRN
  Administered 2023-07-31: 100 mL via INTRAVENOUS

## 2023-08-01 ENCOUNTER — Ambulatory Visit: Payer: Self-pay | Admitting: Oncology

## 2023-08-01 DIAGNOSIS — K769 Liver disease, unspecified: Secondary | ICD-10-CM

## 2023-08-02 NOTE — Telephone Encounter (Signed)
 Spoke to Yvonne Scott and notified her that CT showed new liver lesions, lung noduled. Informed her of recommendation for PET scan and Biospy. She verbalized understanding.   Message sent to IR with liver bx reqeust.  Please schedule PET and I will inform pt of appt details.

## 2023-08-02 NOTE — Telephone Encounter (Signed)
-----   Message from Timmy Forbes sent at 08/01/2023 11:15 PM EDT ----- CT showed new liver lesions, lung nodules. Please arrange her to get liver lesion biopsy ASAP. Please adjust her follow up with me 1 week after biopsy.  Also recommend PET scan restaging. Thanks.

## 2023-08-03 NOTE — Progress Notes (Signed)
 Patient informed of PET scan appointment. Liver biopsy date TBD.

## 2023-08-08 NOTE — Progress Notes (Signed)
 Yvonne Asper, DO sent to [name]; [facility] Procedure Requests OK for US  guided liver mass biopsy.  The lesion next to GB is largest.  Although has centripital filling on CT 5/19, it is definitely new from the CT 05/25/22, compatible with met.  [name]

## 2023-08-09 ENCOUNTER — Ambulatory Visit
Admission: RE | Admit: 2023-08-09 | Discharge: 2023-08-09 | Disposition: A | Discharge status: Home / Self Care | Source: Ambulatory Visit | Attending: Oncology

## 2023-08-09 ENCOUNTER — Ambulatory Visit
Admission: RE | Admit: 2023-08-09 | Discharge: 2023-08-09 | Disposition: A | Discharge status: Home / Self Care | Source: Ambulatory Visit | Attending: Oncology | Admitting: Oncology

## 2023-08-09 DIAGNOSIS — C50911 Malignant neoplasm of unspecified site of right female breast: Secondary | ICD-10-CM | POA: Diagnosis present

## 2023-08-11 ENCOUNTER — Ambulatory Visit
Admission: RE | Admit: 2023-08-11 | Discharge: 2023-08-11 | Disposition: A | Discharge status: Home / Self Care | Source: Ambulatory Visit | Attending: Oncology | Admitting: Oncology

## 2023-08-11 DIAGNOSIS — K769 Liver disease, unspecified: Secondary | ICD-10-CM | POA: Insufficient documentation

## 2023-08-11 LAB — GLUCOSE, CAPILLARY: Glucose-Capillary: 115 mg/dL — ABNORMAL HIGH (ref 70–99)

## 2023-08-11 MED ORDER — FLUDEOXYGLUCOSE F - 18 (FDG) INJECTION
5.9000 | Freq: Once | INTRAVENOUS | Status: AC | PRN
  Administered 2023-08-11: 6.26 via INTRAVENOUS

## 2023-08-12 ENCOUNTER — Other Ambulatory Visit: Payer: Self-pay | Admitting: Family

## 2023-08-12 DIAGNOSIS — K219 Gastro-esophageal reflux disease without esophagitis: Secondary | ICD-10-CM

## 2023-08-14 ENCOUNTER — Ambulatory Visit: Admitting: Oncology

## 2023-08-15 ENCOUNTER — Other Ambulatory Visit (HOSPITAL_COMMUNITY): Payer: Self-pay

## 2023-08-17 ENCOUNTER — Telehealth: Payer: Self-pay

## 2023-08-17 NOTE — Telephone Encounter (Signed)
 Pt scheduled for bx on Thurs 6/12 at 11a, arrive at 10a. She will  let us  know if she cant find a ride to appt.   She would like to keep appt with MD on 6/11

## 2023-08-18 ENCOUNTER — Ambulatory Visit

## 2023-08-22 ENCOUNTER — Other Ambulatory Visit: Payer: Self-pay | Admitting: Radiology

## 2023-08-22 DIAGNOSIS — K769 Liver disease, unspecified: Secondary | ICD-10-CM

## 2023-08-23 ENCOUNTER — Other Ambulatory Visit: Payer: Self-pay | Admitting: Radiology

## 2023-08-23 ENCOUNTER — Inpatient Hospital Stay: Admitting: Oncology

## 2023-08-23 NOTE — Progress Notes (Signed)
 Patient for US  guided Liver Biopsy on Thurs 08/24/23, I called and LVM for the patient on the phone and gave pre-procedure instructions. VM made the patient aware to be here at 10a, NPO after MN prior to procedure as well as driver post procedure/recovery/discharge. Called 08/23/23

## 2023-08-24 ENCOUNTER — Ambulatory Visit: Payer: Self-pay | Admitting: Oncology

## 2023-08-24 ENCOUNTER — Ambulatory Visit
Admission: RE | Admit: 2023-08-24 | Discharge: 2023-08-24 | Disposition: A | Discharge status: Home / Self Care | Source: Ambulatory Visit | Attending: Oncology | Admitting: Oncology

## 2023-08-25 ENCOUNTER — Telehealth: Payer: Self-pay | Admitting: Cardiology

## 2023-08-25 MED ORDER — AMOXICILLIN-POT CLAVULANATE 875-125 MG PO TABS: 1.0000 | ORAL_TABLET | Freq: Two times a day (BID) | ORAL | 0 refills | Status: DC

## 2023-08-25 NOTE — Telephone Encounter (Signed)
 [facility] called requesting an antibiotic for the patient. She has a lot of congestion and dizziness. Has been going on for a couple of weeks. Would like an antibiotic sent to [facility] [address].

## 2023-08-25 NOTE — Telephone Encounter (Signed)
-----   Message from Timmy Forbes sent at 08/24/2023  8:38 PM EDT ----- PET scan findings are consistent with disease progression/recurrence. I recommend patient to get CT-guided liver lesion biopsy ASAP. If she would like to have a discussion with me, please move her appointment up to this week.  Thank you ----- Message ----- From: Interface, Rad Results In Sent: 08/24/2023   2:17 PM EDT To: Timmy Forbes, MD

## 2023-08-25 NOTE — Telephone Encounter (Signed)
 Pt was scheduled to see you on 6/11 and liver bx on 6/12. She called to cancel Bx, which had been r/s twice already. Appt with you was r/s to 7/2, per pt request

## 2023-09-13 ENCOUNTER — Inpatient Hospital Stay

## 2023-09-13 ENCOUNTER — Inpatient Hospital Stay: Attending: Oncology | Admitting: Oncology

## 2023-09-13 ENCOUNTER — Ambulatory Visit: Payer: Self-pay | Admitting: Oncology

## 2023-09-13 ENCOUNTER — Encounter: Payer: Self-pay | Admitting: Oncology

## 2023-09-13 ENCOUNTER — Ambulatory Visit: Admitting: Oncology

## 2023-09-13 VITALS — BP 145/79 | HR 89 | Temp 97.9°F | Resp 18 | Wt 108.7 lb

## 2023-09-13 DIAGNOSIS — C3491 Malignant neoplasm of unspecified part of right bronchus or lung: Secondary | ICD-10-CM

## 2023-09-13 DIAGNOSIS — E876 Hypokalemia: Secondary | ICD-10-CM

## 2023-09-13 DIAGNOSIS — Z17421 Hormone receptor negative with human epidermal growth factor receptor 2 negative status: Secondary | ICD-10-CM | POA: Insufficient documentation

## 2023-09-13 DIAGNOSIS — C20 Malignant neoplasm of rectum: Secondary | ICD-10-CM | POA: Insufficient documentation

## 2023-09-13 DIAGNOSIS — C50911 Malignant neoplasm of unspecified site of right female breast: Secondary | ICD-10-CM | POA: Insufficient documentation

## 2023-09-13 DIAGNOSIS — Z95828 Presence of other vascular implants and grafts: Secondary | ICD-10-CM

## 2023-09-13 DIAGNOSIS — G893 Neoplasm related pain (acute) (chronic): Secondary | ICD-10-CM | POA: Insufficient documentation

## 2023-09-13 DIAGNOSIS — Z720 Tobacco use: Secondary | ICD-10-CM

## 2023-09-13 DIAGNOSIS — F17210 Nicotine dependence, cigarettes, uncomplicated: Secondary | ICD-10-CM | POA: Diagnosis not present

## 2023-09-13 DIAGNOSIS — C787 Secondary malignant neoplasm of liver and intrahepatic bile duct: Secondary | ICD-10-CM | POA: Diagnosis not present

## 2023-09-13 DIAGNOSIS — C3411 Malignant neoplasm of upper lobe, right bronchus or lung: Secondary | ICD-10-CM | POA: Diagnosis not present

## 2023-09-13 DIAGNOSIS — R051 Acute cough: Secondary | ICD-10-CM

## 2023-09-13 DIAGNOSIS — K769 Liver disease, unspecified: Secondary | ICD-10-CM

## 2023-09-13 LAB — CMP (CANCER CENTER ONLY)
ALT: 9 U/L (ref 0–44)
AST: 13 U/L — ABNORMAL LOW (ref 15–41)
Albumin: 3.6 g/dL (ref 3.5–5.0)
Alkaline Phosphatase: 125 U/L (ref 38–126)
Anion gap: 11 (ref 5–15)
BUN: 12 mg/dL (ref 8–23)
CO2: 23 mmol/L (ref 22–32)
Calcium: 9.2 mg/dL (ref 8.9–10.3)
Chloride: 96 mmol/L — ABNORMAL LOW (ref 98–111)
Creatinine: 0.66 mg/dL (ref 0.44–1.00)
GFR, Estimated: >60 mL/min (ref >60)
Glucose, Bld: 121 mg/dL — ABNORMAL HIGH (ref 70–99)
Potassium: 3.1 mmol/L — ABNORMAL LOW (ref 3.5–5.1)
Sodium: 130 mmol/L — ABNORMAL LOW (ref 135–145)
Total Bilirubin: 0.4 mg/dL (ref 0.0–1.2)
Total Protein: 7.7 g/dL (ref 6.5–8.1)

## 2023-09-13 LAB — CBC WITH DIFFERENTIAL (CANCER CENTER ONLY)
Abs Immature Granulocytes: 0.07 10*3/uL (ref 0.00–0.07)
Basophils Absolute: 0 10*3/uL (ref 0.0–0.1)
Basophils Relative: 0 %
Eosinophils Absolute: 0 10*3/uL (ref 0.0–0.5)
Eosinophils Relative: 0 %
HCT: 34.9 % — ABNORMAL LOW (ref 36.0–46.0)
Hemoglobin: 11.6 g/dL — ABNORMAL LOW (ref 12.0–15.0)
Immature Granulocytes: 1 %
Lymphocytes Relative: 15 %
Lymphs Abs: 1.4 10*3/uL (ref 0.7–4.0)
MCH: 28.9 pg (ref 26.0–34.0)
MCHC: 33.2 g/dL (ref 30.0–36.0)
MCV: 86.8 fL (ref 80.0–100.0)
Monocytes Absolute: 0.4 10*3/uL (ref 0.1–1.0)
Monocytes Relative: 5 %
Neutro Abs: 7.8 10*3/uL — ABNORMAL HIGH (ref 1.7–7.7)
Neutrophils Relative %: 79 %
Platelet Count: 450 10*3/uL — ABNORMAL HIGH (ref 150–400)
RBC: 4.02 MIL/uL (ref 3.87–5.11)
RDW: 13.5 % (ref 11.5–15.5)
WBC Count: 9.8 10*3/uL (ref 4.0–10.5)
nRBC: 0 % (ref 0.0–0.2)

## 2023-09-13 LAB — PROTIME-INR
INR: 1 (ref 0.8–1.2)
Prothrombin Time: 14.3 s (ref 11.4–15.2)

## 2023-09-13 LAB — APTT: aPTT: 37 s — ABNORMAL HIGH (ref 24–36)

## 2023-09-13 LAB — LACTATE DEHYDROGENASE: LDH: 217 U/L — ABNORMAL HIGH (ref 98–192)

## 2023-09-13 MED ORDER — HEPARIN SOD (PORK) LOCK FLUSH 100 UNIT/ML IV SOLN
500.0000 [IU] | Freq: Once | INTRAVENOUS | Status: AC
  Administered 2023-09-13: 500 [IU] via INTRAVENOUS
  Filled 2023-09-13: qty 5

## 2023-09-13 MED ORDER — PREDNISONE 10 MG (21) PO TBPK: ORAL_TABLET | ORAL | 0 refills | Status: DC

## 2023-09-13 MED ORDER — SODIUM CHLORIDE 0.9% FLUSH
10.0000 mL | INTRAVENOUS | Status: DC | PRN
  Administered 2023-09-13: 10 mL via INTRAVENOUS
  Filled 2023-09-13: qty 10

## 2023-09-13 MED ORDER — POTASSIUM CHLORIDE CRYS ER 20 MEQ PO TBCR
20.0000 meq | EXTENDED_RELEASE_TABLET | Freq: Every day | ORAL | 0 refills | Status: DC
Start: 2023-09-13 — End: 2023-10-28

## 2023-09-13 NOTE — Assessment & Plan Note (Signed)
 She will get [name] flush Q6-8 weeks if not used.

## 2023-09-13 NOTE — Assessment & Plan Note (Signed)
 Clinical stage III triple negative right breast cancer. cT3 cN0-1 S/p  neoadjuvant carboplatin  Taxol  Keytruda  followed by [facility] with Keytruda  x 3.   4th cycle of AC with keytruda  was not given due to poor tolerance. Status post right mastectomy with sentinel lymph node biopsy. ypT2 ypN0, residual disease, Patient declines any adjuvant chemotherapy or immunotherapy Continue clinical surveillance.   left diagnostic mammogram and ultrasound showed no malignancy.  Genetic counseling showed DICER VUS  Pending above workup to determine tissue origin of metastatic disease.

## 2023-09-13 NOTE — Assessment & Plan Note (Signed)
 Bronchitis/COPD exacerbation versus cancer progression. Patient is on a course of Augmentin  for possible bronchitis. Recommend tapering course of steroid to help with respiratory symptoms.

## 2023-09-13 NOTE — Assessment & Plan Note (Signed)
 Bronchoscopy biopsy of right upper lobe nodule showed squamous cell carcinoma. Other lung nodule was not able to be biopsied due to location and extract for pneumothorax. S/p  lung SBRT.    Recent CT and PET scan results were reviewed with patient.  Concern for metastatic cancer recurrence. Patient has history of triple negative breast cancer, lung cancer, rectal cancer and has finished definitive treatments.  Suspect she has developed metastatic lung cancer. Recommend patient to get CT-guided liver biopsy to establish tissue diagnosis.  Patient has previously no showed to biopsy and today she agrees with proceeding with the plan.

## 2023-09-13 NOTE — Progress Notes (Signed)
 Hematology/Oncology Progress note [phone] [phone]       CHIEF COMPLAINTS/REASON FOR VISIT:  Follow-up for triple negative right breast cancer, squamous cell lung cancer, rectal Stage 0 adenocarcinoma   ASSESSMENT & PLAN:   Cancer Staging  Invasive ductal carcinoma of right breast (HCC) Staging form: Breast, AJCC 8th Edition - Pathologic stage from 10/19/2021: ypT2, ypN0, cM0, G3, ER-, PR-, HER2- - Signed by Babara Call, MD on 11/03/2021  Rectal adenocarcinoma [name]) Staging form: Colon and Rectum, AJCC 8th Edition - Clinical stage from 05/15/2023: Stage 0 (cTis, cN0, cM0) - Signed by Babara Call, MD on 05/15/2023  Squamous cell lung cancer, right (HCC) Staging form: Lung, AJCC 8th Edition - Clinical: cT1, cN0, cM0 - Signed by Babara Call, MD on 01/10/2023   Squamous cell lung cancer, right (HCC) Bronchoscopy biopsy of right upper lobe nodule showed squamous cell carcinoma. Other lung nodule was not able to be biopsied due to location and extract for pneumothorax. S/p  lung SBRT.    Recent CT and PET scan results were reviewed with patient.  Concern for metastatic cancer recurrence. Patient has history of triple negative breast cancer, lung cancer, rectal cancer and has finished definitive treatments.  Suspect she has developed metastatic lung cancer. Recommend patient to get CT-guided liver biopsy to establish tissue diagnosis.  Patient has previously no showed to biopsy and today she agrees with proceeding with the plan.  Rectal adenocarcinoma (HCC) pTis N0 disease.  Status post lower anterior resection, s/p colostomy reversal.  No need for adjuvant radiation or chemotherapy.  Follow-up with surgery. Check CEA. Pending liver biopsy to determine tissue of origin  Invasive ductal carcinoma of right breast (HCC) Clinical stage III triple negative right breast cancer. cT3 cN0-1 S/p  neoadjuvant carboplatin  Taxol  Keytruda  followed by [facility] with Keytruda  x 3.   4th cycle  of AC with keytruda  was not given due to poor tolerance. Status post right mastectomy with sentinel lymph node biopsy. ypT2 ypN0, residual disease, Patient declines any adjuvant chemotherapy or immunotherapy Continue clinical surveillance.   left diagnostic mammogram and ultrasound showed no malignancy.  Genetic counseling showed DICER VUS  Pending above workup to determine tissue origin of metastatic disease.  Tobacco abuse Smoke cessation was discussed with patient.   Port-A-Cath in place She will get [name] flush Q6-8 weeks if not used.   Liver lesion Recommend CT-guided liver biopsy to clarify tissue diagnosis.  Cough Bronchitis/COPD exacerbation versus cancer progression. Patient is on a course of Augmentin  for possible bronchitis. Recommend tapering course of steroid to help with respiratory symptoms.  Hypokalemia Potassium 3.1, recommend potassium chloride  20 mEq daily for 3 days     Orders Placed This Encounter  Procedures   Miscellaneous test (send-out)    Standing Status:   Future    Number of Occurrences:   1    Expected Date:   09/13/2023    Expiration Date:   09/12/2024    Test name / description::   Tempus kit   CBC with Differential (Cancer Center Only)    Standing Status:   Future    Number of Occurrences:   1    Expected Date:   09/13/2023    Expiration Date:   12/12/2023   CMP (Cancer Center only)    Standing Status:   Future    Number of Occurrences:   1    Expected Date:   09/13/2023    Expiration Date:   12/12/2023   Lactate dehydrogenase  Standing Status:   Future    Number of Occurrences:   1    Expected Date:   09/13/2023    Expiration Date:   12/12/2023   CEA    Standing Status:   Future    Number of Occurrences:   1    Expected Date:   09/13/2023    Expiration Date:   12/12/2023   Cancer antigen 27.29    Standing Status:   Future    Number of Occurrences:   1    Expected Date:   09/13/2023    Expiration Date:   12/12/2023   Cancer antigen 15-3     Standing Status:   Future    Number of Occurrences:   1    Expected Date:   09/13/2023    Expiration Date:   12/12/2023   Protime-INR    Standing Status:   Future    Number of Occurrences:   1    Expected Date:   09/13/2023    Expiration Date:   12/12/2023   APTT    Standing Status:   Future    Number of Occurrences:   1    Expected Date:   09/13/2023    Expiration Date:   12/12/2023   Follow-up after CT-guided liver biopsy All questions were answered. The patient knows to call the clinic with any problems, questions or concerns.  [name], MD, PhD [facility] Hematology Oncology 09/13/2023        HISTORY OF PRESENTING ILLNESS:   Yvonne Scott is a  63 y.o.  female presents for follow-up of triple negative breast cancer, lung squamous cancer and rectal cancer.  Oncology History  Invasive ductal carcinoma of right breast (HCC)  11/11/2020 Genetic Testing   Genetic testing done at [facility].  Per note, negative.   02/20/2021 Initial Diagnosis   Invasive ductal carcinoma of right breast (HCC)\  -August 2022, patient was diagnosed with right breast stage IIIb cT3 N0M0, grade 2, ER negative, PR negative HER2 negative breast cancer.  Patient self palpated breast mass many years ago.  10/21/2020 diagnostic mammogram and ultrasound confirmed presence of mass and a subsequent biopsy revealed right breast triple negative cancer. There was plan for patient to start with neoadjuvant chemotherapy followed by surgery and radiation.  Unfortunately patient never started treatment.    Medi port was placed at [facility].  Established care with me on 02/19/2021    02/24/2021 Imaging   CT chest abdomen pelvis showed large right breast mass, 5.5 x 4.8 cm with small right axillary lymph node with variable degrees of enhancement potentially involved but not specific based on size.  This tracked down to retropectoral nodal stations largest lymph node along the lateral margin of the pectoralis major.  Tiny  pulmonary nodules in the chest as discussed.  These are nonspecific but would consider short interval follow-up to assess for any changes.  No evidence of metastatic disease involving the abdomen or pelvis.  Aortic atherosclerosis.   02/25/2021 Echocardiogram   echocardiogram showed LVEF 65 to 70%.  Patient has been to chemotherapy class and understands antiemetics instructions   03/11/2021 - 08/26/2021 Chemotherapy   BREAST Pembrolizumab  (200) D1 + Carboplatin  (5) D1 + Paclitaxel  (80) D1,8,15 q21d X 4 cycles, followed by Pembrolizumab  (200) D1 + AC D1 q21d x 3 cycles. 4th cycle was omitted due to poor tolerance.     03/16/2021 Imaging   bone scan showed no evidence of osseous metastatic breast cancer, asymmetric osseous uptake  on the right at L5-S1, corresponding to degenerative disc disease on recent CT.  Asymmetric soft tissue uptake in the right breast   07/13/2021 Echocardiogram   Echocardiogram showed LVEF 60-65%.  Grade 1 diastolic dysfunction.  Refer details to echocardiogram reports.   10/19/2021 Surgery   Patient underwent right modified radical mastectomy  Pathology showed invasive mammary carcinoma, no special type, multifocal, with focal chondroid stroma.  Clip and biopsy sites identified.  12 lymph nodes negative for malignancy.  2 lymph nodes displaying dense fibrous scarring, compatible with pathological complete response.  Benign nipple/areola.   Grade 3, LVI not identified, DCIS not identified.  All margins negative for invasive carcinoma.   ypT2 ypN0  Comment Due to the presence of dense fibrosis and treatment effect in two of the sampled lymph nodes, pancytokeratin stains were performed on all submitted lymph nodes.  All lymph nodes are negative for residual metastatic carcinoma.  A separate focus of invasive carcinoma, measuring approximately 11 mm in greatest extent, and histologically similar to the primary tumor, is identified within sampling of the fibrous tissue adjacent to  the primary tumor.    10/19/2021 Cancer Staging   Staging form: Breast, AJCC 8th Edition - Pathologic stage from 10/19/2021: No Stage Recommended (ypT2, pN0, cM0, G3, ER-, PR-, HER2-) - Signed by Babara Call, MD on 11/03/2021 Stage prefix: Post-therapy Response to neoadjuvant therapy: Partial response Multigene prognostic tests performed: None Histologic grading system: 3 grade system   05/27/2022 Imaging   CT chest abdomen pelvis with contrast showed 1. Interval right mastectomy with resolution of suspicious right axillary nodes. 2. Enlargement of a 5 mm right lower lobe pulmonary nodule. Development of a 5 mm left upper lobe pulmonary nodule. Cannot exclude early metastatic disease. Consider chest CT follow-up at 3 months. 3. Otherwise, no evidence of metastatic disease in the chest, abdomen, or pelvis. 4. Age advanced coronary artery atherosclerosis. Recommend assessment of coronary risk factors. 5. Aortic atherosclerosis (ICD10-I70.0) and emphysema (ICD10-J43.9). 6. Mild enlargement of the common duct, which is now minimally dilated for age. Correlate with bilirubin levels. If elevated,consider ERCP.   09/09/2022 Imaging   CT chest without contrast showed 1. Interval growth of three solid bilateral pulmonary nodules since 05/25/2022 chest CT, largest 0.9 cm in the anteromedial left upper lobe, highly suspicious for pulmonary metastases. 2. No thoracic adenopathy. 3. Three-vessel coronary atherosclerosis. 4. Aortic Atherosclerosis (ICD10-I70.0) and Emphysema (ICD10-J43.9).     09/29/2022 Imaging   PET scan shows 1. Hypermetabolic bilateral pulmonary nodules, consistent with metastasis. 2. No other evidence of tracer avid metastatic disease, status post right mastectomy. 3. Hypermetabolism with suggestion of soft tissue fullness in the mid rectum, suspicious for polyp or carcinoma. Consider further evaluation with sigmoidoscopy. 4. Incidental findings, including: Aortic  atherosclerosis (ICD10-I70.0), coronary artery atherosclerosis and emphysema (ICD10-J43.9).   08/01/2023 Imaging   CT chest abdomen pelvis with contrast  Chest:   1. Interval increase in multiple pulmonary nodules which are suspicious for pulmonary metastatic disease. The largest is in the medial left upper lobe and measures 2.2 cm. 2. Left hilar and mediastinal lymphadenopathy is present with the largest left hilar lymph node measuring 2.1 cm.   Abdomen and pelvis:   1. Progression of liver metastatic disease from July 01, 2023 and most notably from May 25, 2022. The largest liver lesion is estimated at 2.1 cm in largest dimension.   08/11/2023 Imaging   PET scan showed Overall progression of disease.   As seen on the recent CT scan hypermetabolic  lymph nodes the left side of the mediastinum, AP window and hilum. Additional smaller focus seen in the upper anterior mediastinum at the level of the manubrium.   Developing multiple hypermetabolic lung nodules including several which are juxtapleural including the dominant lesion medially along the left upper lobe anteriorly.   Development of multiple hypermetabolic liver lesions. At least 8 lesions are seen.   Focal nodular area of wall thickening along the anterior aspect of the antrum of the stomach with abnormal uptake. In addition there is adjacent mesenteric nodules just caudal to the antrum of the stomach which could represent peritoneal nodules versus abnormal lymph nodes.   Four hypermetabolic bone lesions consistent with osseous metastatic disease. Areas include right iliac bone above the acetabulum as well as along the spine at T11, T12 and L5.   Surgical changes. There is a small subcutaneous nodule at the level of the pelvis in the midline anteriorly with some minimal uptake. Attention on follow-up.     Squamous cell lung cancer, right (HCC)  08/12/2022 Initial Diagnosis   Squamous cell lung cancer, right  (HCC)   01/10/2023 Cancer Staging   Staging form: Lung, AJCC 8th Edition - Clinical: cT1, cN0, cM0 - Signed by Babara Call, MD on 01/10/2023 Stage prefix: Initial diagnosis   08/01/2023 Imaging   CT chest abdomen pelvis with contrast  Chest:   1. Interval increase in multiple pulmonary nodules which are suspicious for pulmonary metastatic disease. The largest is in the medial left upper lobe and measures 2.2 cm. 2. Left hilar and mediastinal lymphadenopathy is present with the largest left hilar lymph node measuring 2.1 cm.   Abdomen and pelvis:   1. Progression of liver metastatic disease from July 01, 2023 and most notably from May 25, 2022. The largest liver lesion is estimated at 2.1 cm in largest dimension.   08/11/2023 Imaging   PET scan showed Overall progression of disease.   As seen on the recent CT scan hypermetabolic lymph nodes the left side of the mediastinum, AP window and hilum. Additional smaller focus seen in the upper anterior mediastinum at the level of the manubrium.   Developing multiple hypermetabolic lung nodules including several which are juxtapleural including the dominant lesion medially along the left upper lobe anteriorly.   Development of multiple hypermetabolic liver lesions. At least 8 lesions are seen.   Focal nodular area of wall thickening along the anterior aspect of the antrum of the stomach with abnormal uptake. In addition there is adjacent mesenteric nodules just caudal to the antrum of the stomach which could represent peritoneal nodules versus abnormal lymph nodes.   Four hypermetabolic bone lesions consistent with osseous metastatic disease. Areas include right iliac bone above the acetabulum as well as along the spine at T11, T12 and L5.   Surgical changes. There is a small subcutaneous nodule at the level of the pelvis in the midline anteriorly with some minimal uptake. Attention on follow-up.     Rectal adenocarcinoma  (HCC)  10/13/2022 Initial Diagnosis   Rectal adenocarcinoma (HCC)  patient has large polyp and biopsy showed rectal adenocarcinoma-at least intramucosal. She was also recommended to have bronchoscopy ASAP for further evaluation of the lung nodules.  Patient adamantly declined pulmonology workup concurrently with colonoscopy.  She eventually underwent pulmonology workup with bronchoscopy biopsy of right upper lobe nodule and the results showed squamous cell carcinoma.   lingula nodule was not biopsied due to location and threat for pneumothorax.  Denies hematochezia,  black tarry stool  or abdominal pain, change of bowel habits.   01/16/2023 Imaging   MRI pelvis without contrast showed 4.1 cm upper/mid rectal mass, as above.   Rectal adenocarcinoma T stage: T2.   Rectal adenocarcinoma N stage: N0.   Distance from tumor to the internal anal sphincter is 7.0 cm.   04/21/2023 Surgery   Patient underwent distal sigmoid colon and rectum low anterior resection Pathology showed Tubulovillous adenoma with high-grade dysplasia/intramucosal carcinoma  (pTis)  Negative for invasion  Tumor measures 2.6 x 2.3 x 2.1 cm  Margins free of adenomatous change and carcinoma  Eight benign perirectal lymph nodes (0/8, pN0  Final distal margin benign: Negative for dysplasia and carcinoma.    05/15/2023 Cancer Staging   Staging form: Colon and Rectum, AJCC 8th Edition - Clinical stage from 05/15/2023: Stage 0 (cTis, cN0, cM0) - Signed by Babara Call, MD on 05/15/2023 Stage prefix: Initial diagnosis Total positive nodes: 0   07/07/2023 Surgery   diverting loop ileostomy take down   08/01/2023 Imaging   CT chest abdomen pelvis with contrast  Chest:   1. Interval increase in multiple pulmonary nodules which are suspicious for pulmonary metastatic disease. The largest is in the medial left upper lobe and measures 2.2 cm. 2. Left hilar and mediastinal lymphadenopathy is present with the largest left hilar lymph  node measuring 2.1 cm.   Abdomen and pelvis:   1. Progression of liver metastatic disease from July 01, 2023 and most notably from May 25, 2022. The largest liver lesion is estimated at 2.1 cm in largest dimension.   08/11/2023 Imaging   PET scan showed Overall progression of disease.   As seen on the recent CT scan hypermetabolic lymph nodes the left side of the mediastinum, AP window and hilum. Additional smaller focus seen in the upper anterior mediastinum at the level of the manubrium.   Developing multiple hypermetabolic lung nodules including several which are juxtapleural including the dominant lesion medially along the left upper lobe anteriorly.   Development of multiple hypermetabolic liver lesions. At least 8 lesions are seen.   Focal nodular area of wall thickening along the anterior aspect of the antrum of the stomach with abnormal uptake. In addition there is adjacent mesenteric nodules just caudal to the antrum of the stomach which could represent peritoneal nodules versus abnormal lymph nodes.   Four hypermetabolic bone lesions consistent with osseous metastatic disease. Areas include right iliac bone above the acetabulum as well as along the spine at T11, T12 and L5.   Surgical changes. There is a small subcutaneous nodule at the level of the pelvis in the midline anteriorly with some minimal uptake. Attention on follow-up.      07/16/2021-07/19/2021, patient was admitted for HCAP patient was treated with antibiotics 09/04/2021 - 09/08/2021 Patient was admitted due to pancytopenia, thrush electrolyte abnormality.    S/p diverting loop ileostomy take down  INTERVAL HISTORY Yvonne Scott is a 63 y.o. female who has above history reviewed by me today presents for follow up visit for management of triple negative breast cancer, lung squamous cell carcinoma, stage 0 rectal adenocarcinoma   Patient continues to smoke on some days.   She reports feeling  poorly.  She has been sick recently with cough, congestion.  She was prescribed a course of Augmentin .  Symptoms slightly improved.  She was recommended to have CT-guided liver biopsy scheduled on 08/24/2023, patient no showed due to not feeling well. Today she presents for follow-up, accompanied by her mother. She  has poor appetite and has lost weight.  No fever, chills, nausea vomiting diarrhea blood in the stool.  Patient denies any pain currently.   Review of Systems  Constitutional:  Positive for appetite change and fatigue. Negative for chills, fever and unexpected weight change.  HENT:   Negative for hearing loss, mouth sores and voice change.   Eyes:  Negative for eye problems.  Respiratory:  Negative for chest tightness and cough.   Cardiovascular:  Negative for chest pain.  Gastrointestinal:  Negative for abdominal distention, abdominal pain, blood in stool, diarrhea and nausea.  Endocrine: Negative for hot flashes.  Genitourinary:  Negative for difficulty urinating, dysuria and frequency.   Musculoskeletal:  Positive for arthralgias and back pain.  Skin:  Negative for itching and rash.  Neurological:  Positive for numbness. Negative for extremity weakness.  Hematological:  Negative for adenopathy.  Psychiatric/Behavioral:  Negative for confusion. The patient is nervous/anxious.     MEDICAL HISTORY:  Past Medical History:  Diagnosis Date   Anxiety    Aortic atherosclerosis (HCC) 10/24/2016   Breast cancer (HCC)    colon cancer  and rectal   COPD (chronic obstructive pulmonary disease) (HCC)    Depression    Dyspnea    GERD (gastroesophageal reflux disease)    Hypokalemia    Hypomagnesemia    Invasive ductal carcinoma of right breast (HCC)    Metabolic acidosis    Personal history of chemotherapy    Pneumonia    Severe protein-calorie malnutrition (HCC)    Thrombocytopenia (HCC)     SURGICAL HISTORY: Past Surgical History:  Procedure Laterality Date   BREAST  BIOPSY Right 2022   BRONCHIAL BIOPSY  12/29/2022   Procedure: BRONCHIAL BIOPSIES;  Surgeon: Isadora Hose, MD;  Location: [facility];  Service: Pulmonary;;   BRONCHIAL NEEDLE ASPIRATION BIOPSY  12/29/2022   Procedure: BRONCHIAL NEEDLE ASPIRATION BIOPSIES;  Surgeon: Isadora Hose, MD;  Location: [facility];  Service: Pulmonary;;   COLONOSCOPY WITH PROPOFOL  N/A 12/07/2022   Procedure: COLONOSCOPY WITH PROPOFOL ;  Surgeon: Unk Corinn Skiff, MD;  Location: [facility] ENDOSCOPY;  Service: Gastroenterology;  Laterality: N/A;   COLOSTOMY TAKEDOWN N/A 07/07/2023   Procedure: TAKEDOWN OF LOOP ILEOSTOMY;  Surgeon: Teresa Lonni HERO, MD;  Location: [facility];  Service: General;  Laterality: N/A;  TAKEDOWN OF LOOP ILESTOMY   FLEXIBLE SIGMOIDOSCOPY N/A 04/21/2023   Procedure: FLEXIBLE SIGMOIDOSCOPY, ICG PERFUSION;  Surgeon: Teresa Lonni HERO, MD;  Location: [facility];  Service: General;  Laterality: N/A;   FLEXIBLE SIGMOIDOSCOPY N/A 07/07/2023   Procedure: KINGSTON SIDE;  Surgeon: Teresa Lonni HERO, MD;  Location: [facility];  Service: General;  Laterality: N/A;  DIAGNOSTIC FLEXIBLE SIGMOIDOSCOPY   FOOT SURGERY Left    little toe correction   MASTECTOMY W/ SENTINEL NODE BIOPSY Right 10/19/2021   Procedure: MASTECTOMY WITH SENTINEL LYMPH NODE BIOPSY ( modified radical);  Surgeon: Rodolph Romano, MD;  Location: [facility] ORS;  Service: General;  Laterality: Right;   POLYPECTOMY  12/07/2022   Procedure: POLYPECTOMY;  Surgeon: Unk Corinn Skiff, MD;  Location: [facility] ENDOSCOPY;  Service: Gastroenterology;;   PORTA CATH INSERTION  01/19/2021   SUBMUCOSAL TATTOO INJECTION  12/07/2022   Procedure: SUBMUCOSAL TATTOO INJECTION;  Surgeon: Unk Corinn Skiff, MD;  Location: [facility] ENDOSCOPY;  Service: Gastroenterology;;   VIDEO BRONCHOSCOPY WITH ENDOBRONCHIAL ULTRASOUND N/A 12/29/2022   Procedure: VIDEO BRONCHOSCOPY WITH ENDOBRONCHIAL ULTRASOUND;  Surgeon: Isadora Hose, MD;  Location: [facility];   Service: Pulmonary;  Laterality: N/A;   XI ROBOTIC ASSISTED LOWER ANTERIOR RESECTION  N/A 04/21/2023   Procedure: XI ROBOTIC ASSISTED LOWER ANTERIOR RESECTION, ILEOSTOMY CREATION;  Surgeon: Teresa Lonni HERO, MD;  Location: [facility];  Service: General;  Laterality: N/A;    SOCIAL HISTORY: Social History   Socioeconomic History   Marital status: Widowed    Spouse name: Not on file   Number of children: 1   Years of education: Not on file   Highest education level: Not on file  Occupational History   Not on file  Tobacco Use   Smoking status: Every Day    Types: Cigarettes   Smokeless tobacco: Never   Tobacco comments:    3-4 cigarettes a day  Vaping Use   Vaping status: Never Used  Substance and Sexual Activity   Alcohol use: Never    Alcohol/week: 3.0 standard drinks of alcohol    Types: 3 Standard drinks or equivalent per week   Drug use: Never   Sexual activity: Not Currently    Birth control/protection: None  Other Topics Concern   Not on file  Social History Narrative   Lives with mother   Social Drivers of Health   Financial Resource Strain: Low Risk  (01/06/2022)   Overall Financial Resource Strain (CARDIA)    Difficulty of Paying Living Expenses: Not very hard  Food Insecurity: No Food Insecurity (07/09/2023)   Hunger Vital Sign    Worried About Running Out of Food in the Last Year: Never true    Ran Out of Food in the Last Year: Never true  Transportation Needs: No Transportation Needs (07/09/2023)   PRAPARE - Administrator, Civil Service (Medical): No    Lack of Transportation (Non-Medical): No  Physical Activity: Inactive (01/06/2022)   Exercise Vital Sign    Days of Exercise per Week: 0 days    Minutes of Exercise per Session: 0 min  Stress: Stress Concern Present (01/06/2022)   [employer] of Occupational Health - Occupational Stress Questionnaire    Feeling of Stress : Rather much  Social Connections: Moderately Isolated (07/01/2023)    Social Connection and Isolation Panel    Frequency of Communication with Friends and Family: Three times a week    Frequency of Social Gatherings with Friends and Family: Three times a week    Attends Religious Services: 1 to 4 times per year    Active Member of Clubs or Organizations: No    Attends Banker Meetings: Never    Marital Status: Widowed  Intimate Partner Violence: Not At Risk (07/09/2023)   Humiliation, Afraid, Rape, and Kick questionnaire    Fear of Current or Ex-Partner: No    Emotionally Abused: No    Physically Abused: No    Sexually Abused: No    FAMILY HISTORY: Family History  Problem Relation Age of Onset   Melanoma Father 29   Cirrhosis Maternal Grandfather    Breast cancer Paternal Grandmother     ALLERGIES:  has no known allergies.  MEDICATIONS:  Current Outpatient Medications  Medication Sig Dispense Refill   amoxicillin -clavulanate (AUGMENTIN ) 875-125 MG tablet Take 1 tablet by mouth 2 (two) times daily. 20 tablet 0   budesonide -formoterol  (SYMBICORT ) 160-4.5 MCG/ACT inhaler Inhale 2 puffs into the lungs 2 (two) times daily. 1 each 12   fluticasone  (FLONASE ) 50 MCG/ACT nasal spray Place 1 spray into both nostrils daily as needed for rhinitis or allergies.     ibuprofen  (ADVIL ) 200 MG tablet Take 200-400 mg by mouth every 6 (six) hours as needed for headache.  lidocaine -prilocaine  (EMLA ) cream APPLY TO AFFECTED AREA ONCE 30 g 0   magnesium  oxide (MAG-OX) 400 (240 Mg) MG tablet Take 800 mg by mouth daily.     omeprazole  (PRILOSEC) 40 MG capsule Take 1 capsule by mouth once daily 90 capsule 0   predniSONE  (STERAPRED UNI-PAK 21 TAB) 10 MG (21) TBPK tablet Day 1 take 6 tablets, Day 2 take 5 tablets, Day 3 take 4 tablets, Day 4 take 3 tablets, Day 5 take 2 tablets D6 take 1 tablet 1 each 0   VENTOLIN  HFA 108 (90 Base) MCG/ACT inhaler INHALE 1 PUFF BY MOUTH EVERY 6 HOURS AS NEEDED FOR SHORTNESS OF BREATH FOR WHEEZING (Patient taking differently:  Inhale 1-2 puffs into the lungs every 6 (six) hours as needed for shortness of breath or wheezing.) 18 g 0   naloxone (NARCAN) nasal spray 4 mg/0.1 mL Place 0.4 mg into the nose once as needed (as directed). (Patient not taking: Reported on 09/13/2023)     No current facility-administered medications for this visit.   Facility-Administered Medications Ordered in Other Visits  Medication Dose Route Frequency Provider Last Rate Last Admin   famotidine  (PEPCID ) 20-0.9 MG/50ML-% IVPB            sodium chloride  flush (NS) 0.9 % injection 10 mL  10 mL Intravenous PRN Babara Call, MD   10 mL at 09/13/23 1403     PHYSICAL EXAMINATION: ECOG PERFORMANCE STATUS: 1 - Symptomatic but completely ambulatory Vitals:   09/13/23 1407  BP: (!) 145/79  Pulse: 89  Resp: 18  Temp: 97.9 F (36.6 C)  SpO2: 100%   Filed Weights   09/13/23 1407  Weight: 108 lb 11.2 oz (49.3 kg)    Physical Exam Constitutional:      General: She is not in acute distress. HENT:     Head: Normocephalic and atraumatic.  Eyes:     General: No scleral icterus. Cardiovascular:     Rate and Rhythm: Normal rate.  Pulmonary:     Effort: Pulmonary effort is normal. No respiratory distress.     Comments: Decreased breath sound.  Abdominal:     General: Bowel sounds are normal. There is no distension.     Palpations: Abdomen is soft.  Musculoskeletal:        General: No deformity. Normal range of motion.     Cervical back: Normal range of motion and neck supple.  Skin:    Findings: No erythema or rash.  Neurological:     Mental Status: She is alert and oriented to person, place, and time. Mental status is at baseline.      LABORATORY DATA:  I have reviewed the data as listed    Latest Ref Rng & Units 09/13/2023    2:59 PM 07/24/2023    1:54 PM 07/10/2023    5:44 AM  CBC  WBC 4.0 - 10.5 K/uL 9.8  11.9  7.6   Hemoglobin 12.0 - 15.0 g/dL 88.3  89.8  8.4   Hematocrit 36.0 - 46.0 % 34.9  31.2  26.3   Platelets 150 - 400  K/uL 450  533  296       Latest Ref Rng & Units 09/13/2023    3:00 PM 07/24/2023    1:54 PM 07/12/2023    5:11 AM  CMP  Glucose 70 - 99 mg/dL 878  878  891   BUN 8 - 23 mg/dL 12  10  <5   Creatinine 0.44 - 1.00 mg/dL 9.33  0.69  0.35   Sodium 135 - 145 mmol/L 130  137  136   Potassium 3.5 - 5.1 mmol/L 3.1  3.6  3.6   Chloride 98 - 111 mmol/L 96  103  104   CO2 22 - 32 mmol/L 23  22  21    Calcium  8.9 - 10.3 mg/dL 9.2  8.7  6.6   Total Protein 6.5 - 8.1 g/dL 7.7  6.7    Total Bilirubin 0.0 - 1.2 mg/dL 0.4  0.4    Alkaline Phos 38 - 126 U/L 125  71    AST 15 - 41 U/L 13  16    ALT 0 - 44 U/L 9  11          RADIOGRAPHIC STUDIES: I have personally reviewed the radiological images as listed and agreed with the findings in the report. No results found.

## 2023-09-13 NOTE — Assessment & Plan Note (Signed)
 pTis N0 disease.  Status post lower anterior resection, s/p colostomy reversal.  No need for adjuvant radiation or chemotherapy.  Follow-up with surgery. Check CEA. Pending liver biopsy to determine tissue of origin

## 2023-09-13 NOTE — Assessment & Plan Note (Signed)
 Potassium 3.1, recommend potassium chloride  20 mEq daily for 3 days

## 2023-09-13 NOTE — Assessment & Plan Note (Signed)
Smoke cessation was discussed with patient.

## 2023-09-13 NOTE — Assessment & Plan Note (Signed)
 Recommend CT-guided liver biopsy to clarify tissue diagnosis.

## 2023-09-14 ENCOUNTER — Other Ambulatory Visit: Payer: Self-pay

## 2023-09-14 ENCOUNTER — Telehealth: Payer: Self-pay

## 2023-09-14 ENCOUNTER — Encounter: Payer: Self-pay | Admitting: Oncology

## 2023-09-14 LAB — CANCER ANTIGEN 27.29: CA 27.29: 165.5 U/mL — ABNORMAL HIGH (ref 0.0–38.6)

## 2023-09-14 LAB — CANCER ANTIGEN 15-3: CA 15-3: 121 U/mL — ABNORMAL HIGH (ref 0.0–25.0)

## 2023-09-14 LAB — CEA: CEA: 1.9 ng/mL (ref 0.0–4.7)

## 2023-09-14 MED ORDER — ONDANSETRON HCL 8 MG PO TABS: 8.0000 mg | ORAL_TABLET | Freq: Three times a day (TID) | ORAL | 0 refills | Status: DC | PRN

## 2023-09-14 NOTE — Telephone Encounter (Signed)
 Request sent to IR to r/s liver bx. Pt informed that it will be next week when we will get this scheduled since Clarita is out of office.

## 2023-09-14 NOTE — Telephone Encounter (Signed)
 Spoke to [place] and informed her of low potassium level and that Rx was sent to pharmacy. Pt verbalized understanding.

## 2023-09-14 NOTE — Telephone Encounter (Signed)
 Patient called request rx for on going nausea.  Forgot to ask at visit yesterday.

## 2023-09-14 NOTE — Telephone Encounter (Signed)
-----   Message from [name] sent at 09/13/2023 10:44 PM EDT ----- Potassium level is low, recommend her to take potassium chloride  20 mEq daily for 3 days.  Prescription was sent to pharmacy. ----- Message ----- From: Rebecka, [facility] [place] Sent: 09/13/2023   3:20 PM EDT To: [name], MD

## 2023-09-14 NOTE — Telephone Encounter (Signed)
 Rx sent to [facility]- Ondansetron  8mg  q8 hrs PRN

## 2023-09-14 NOTE — Telephone Encounter (Signed)
 Pt.notified

## 2023-09-19 ENCOUNTER — Telehealth: Payer: Self-pay | Admitting: *Deleted

## 2023-09-19 NOTE — Telephone Encounter (Signed)
 Dr. Babara would like pt to see Josh B for pain management. Please schedule and notify pt of appt. Ok to see Sidra when he is back to clinic.   I will reach out to pt with bx date once I hear back from Dale.

## 2023-09-19 NOTE — Telephone Encounter (Signed)
 Patient called and said that she is having and in the past when she has had hydrocodone  as above her pharmacy is at [facility]  She would like a refill of this medicine but it has been a while since she has had it and if someone can get see when the patient is going to get that liver biopsy

## 2023-09-20 ENCOUNTER — Other Ambulatory Visit: Payer: Self-pay | Admitting: Oncology

## 2023-09-20 ENCOUNTER — Encounter: Payer: Self-pay | Admitting: Oncology

## 2023-09-20 LAB — MISCELLANEOUS TEST

## 2023-09-20 MED ORDER — TRAMADOL HCL 50 MG PO TABS: 50.0000 mg | ORAL_TABLET | Freq: Four times a day (QID) | ORAL | 0 refills | Status: DC | PRN

## 2023-09-20 NOTE — Telephone Encounter (Signed)
 Pt scheduled for liver bx on 7/15 @ 10am, arrive 9am. pt aware of appt.   please schedule MD (for results) on 7/24 @ 2 pm. pt aware of date/ time

## 2023-09-20 NOTE — Telephone Encounter (Signed)
 Pt also states that back pain is worsening and Tylenol  is not helping. Pt requests something stronger. Dr. Babara has sent in Tramadol  and pt has been notified.

## 2023-09-25 ENCOUNTER — Other Ambulatory Visit (HOSPITAL_COMMUNITY): Payer: Self-pay | Admitting: Student

## 2023-09-25 ENCOUNTER — Encounter: Payer: Self-pay | Admitting: Oncology

## 2023-09-25 NOTE — Progress Notes (Signed)
 Patient for US  guided Liver Biopsy on Tues 09/26/23, I called and spoke with the patient on the phone and gave pre-procedure instructions. Pt was made aware to be here at 9a, NPO after MN prior to procedure as well as driver post procedure/recovery/discharge. Pt stated understanding.  Called 09/25/23

## 2023-09-25 NOTE — H&P (Signed)
 Chief Complaint: Patient was seen in consultation today for liver lesion, with consideration for biopsy.  Referring Provider(s): Dr. [name], MD   Supervising Physician: Philip Cornet  Patient Status: [facility] - Out-pt  Patient is Full Code  History of Present Illness: Yvonne Scott is a 63 y.o. female  with PMHx notable for invasive ductal carcinoma of right breast s/p chemotherapy, rectal adenocarcinoma, squamous cell right lung cancer, thrombocytopenia, metabolic acidosis, severe malnutrition, COPD, GERD, anxiety, and depression.  Per Dr. Layvonne progress note on 7/2: Recent CT and PET scan results were reviewed with patient.  Concern for metastatic cancer recurrence. Patient has history of triple negative breast cancer, lung cancer, rectal cancer and has finished definitive treatments.  Suspect she has developed metastatic lung cancer. Recommend patient to get CT-guided liver biopsy to establish tissue diagnosis.  Patient has previously no showed to biopsy and today she agrees with proceeding with the plan.   Interventional Radiology was requested for liver lesion biopsy. Request was reviewed and approved by Dr. Alona. Patient has rescheduled biopsy twice, but presents and  will be seen for same in IR today.   Patient is alert and laying in bed, calm.  Patient have experienced nausea and vomiting for 2 days, for which she took 4 mg of Zofran  this am, along with 400 mg ibuprofen  for headache. She also experiences mild discomfort at level of diaphragm, bilaterally. Patient denies any fevers, headache, chest pain, SOB, cough, abdominal pain, nausea, vomiting or bleeding.     Past Medical History:  Diagnosis Date   Anxiety    Aortic atherosclerosis (HCC) 10/24/2016   Breast cancer (HCC)    colon cancer  and rectal   COPD (chronic obstructive pulmonary disease) (HCC)    Depression    Dyspnea    GERD (gastroesophageal reflux disease)    Hypokalemia    Hypomagnesemia     Invasive ductal carcinoma of right breast (HCC)    Metabolic acidosis    Personal history of chemotherapy    Pneumonia    Severe protein-calorie malnutrition (HCC)    Thrombocytopenia (HCC)     Past Surgical History:  Procedure Laterality Date   BREAST BIOPSY Right 2022   BRONCHIAL BIOPSY  12/29/2022   Procedure: BRONCHIAL BIOPSIES;  Surgeon: Isadora Hose, MD;  Location: [facility];  Service: Pulmonary;;   BRONCHIAL NEEDLE ASPIRATION BIOPSY  12/29/2022   Procedure: BRONCHIAL NEEDLE ASPIRATION BIOPSIES;  Surgeon: Isadora Hose, MD;  Location: [facility];  Service: Pulmonary;;   COLONOSCOPY WITH PROPOFOL  N/A 12/07/2022   Procedure: COLONOSCOPY WITH PROPOFOL ;  Surgeon: Unk Corinn Skiff, MD;  Location: [facility];  Service: Gastroenterology;  Laterality: N/A;   COLOSTOMY TAKEDOWN N/A 07/07/2023   Procedure: TAKEDOWN OF LOOP ILEOSTOMY;  Surgeon: Teresa Lonni HERO, MD;  Location: [facility];  Service: General;  Laterality: N/A;  TAKEDOWN OF LOOP ILESTOMY   FLEXIBLE SIGMOIDOSCOPY N/A 04/21/2023   Procedure: FLEXIBLE SIGMOIDOSCOPY, ICG PERFUSION;  Surgeon: Teresa Lonni HERO, MD;  Location: [facility];  Service: General;  Laterality: N/A;   FLEXIBLE SIGMOIDOSCOPY N/A 07/07/2023   Procedure: KINGSTON SIDE;  Surgeon: Teresa Lonni HERO, MD;  Location: [facility];  Service: General;  Laterality: N/A;  DIAGNOSTIC FLEXIBLE SIGMOIDOSCOPY   FOOT SURGERY Left    little toe correction   MASTECTOMY W/ SENTINEL NODE BIOPSY Right 10/19/2021   Procedure: MASTECTOMY WITH SENTINEL LYMPH NODE BIOPSY ( modified radical);  Surgeon: Rodolph Romano, MD;  Location: [facility] OR[facility];  Service: General;  Laterality: Right;  POLYPECTOMY  12/07/2022   Procedure: POLYPECTOMY;  Surgeon: Unk Corinn Skiff, MD;  Location: [facility];  Service: Gastroenterology;;   PORTA CATH INSERTION  01/19/2021   SUBMUCOSAL TATTOO INJECTION  12/07/2022   Procedure: SUBMUCOSAL TATTOO INJECTION;  Surgeon: Unk Corinn Skiff, MD;  Location: [facility];  Service: Gastroenterology;;   VIDEO BRONCHOSCOPY WITH ENDOBRONCHIAL ULTRASOUND N/A 12/29/2022   Procedure: VIDEO BRONCHOSCOPY WITH ENDOBRONCHIAL ULTRASOUND;  Surgeon: Isadora Hose, MD;  Location: [facility];  Service: Pulmonary;  Laterality: N/A;   XI ROBOTIC ASSISTED LOWER ANTERIOR RESECTION N/A 04/21/2023   Procedure: XI ROBOTIC ASSISTED LOWER ANTERIOR RESECTION, ILEOSTOMY CREATION;  Surgeon: Teresa Lonni HERO, MD;  Location: [facility];  Service: General;  Laterality: N/A;    Allergies: Patient has no known allergies.  Medications: Prior to Admission medications   Medication Sig Start Date End Date Taking? Authorizing Provider  amoxicillin -clavulanate (AUGMENTIN ) 875-125 MG tablet Take 1 tablet by mouth 2 (two) times daily. 08/25/23   Orlean Alan HERO, FNP  budesonide -formoterol  (SYMBICORT ) 160-4.5 MCG/ACT inhaler Inhale 2 puffs into the lungs 2 (two) times daily. 01/20/23   Isadora Hose, MD  fluticasone  (FLONASE ) 50 MCG/ACT nasal spray Place 1 spray into both nostrils daily as needed for rhinitis or allergies. 07/05/23   Jens Durand, MD  ibuprofen  (ADVIL ) 200 MG tablet Take 200-400 mg by mouth every 6 (six) hours as needed for headache.    [provider]  lidocaine -prilocaine  (EMLA ) cream APPLY TO AFFECTED AREA ONCE 11/09/22   Babara Call, MD  magnesium  oxide (MAG-OX) 400 (240 Mg) MG tablet Take 800 mg by mouth daily.    [provider]  naloxone [facility]) nasal spray 4 mg/0.1 mL Place 0.4 mg into the nose once as needed (as directed). Patient not taking: Reported on 09/13/2023 10/28/21   [provider]  omeprazole  (PRILOSEC) 40 MG capsule Take 1 capsule by mouth once daily 08/14/23   Scoggins, Amber, NP  ondansetron  (ZOFRAN ) 8 MG tablet Take 1 tablet (8 mg total) by mouth every 8 (eight) hours as needed for nausea or vomiting. 09/14/23   Babara Call, MD  potassium chloride  SA (KLOR-CON  M) 20 MEQ tablet Take 1 tablet (20 mEq  total) by mouth daily. 09/13/23   Babara Call, MD  predniSONE  (STERAPRED UNI-PAK 21 TAB) 10 MG (21) TBPK tablet Day 1 take 6 tablets, Day 2 take 5 tablets, Day 3 take 4 tablets, Day 4 take 3 tablets, Day 5 take 2 tablets D6 take 1 tablet 09/13/23   Babara Call, MD  traMADol  (ULTRAM ) 50 MG tablet Take 1 tablet (50 mg total) by mouth every 6 (six) hours as needed. 09/20/23   Babara Call, MD  VENTOLIN  HFA 108 (90 Base) MCG/ACT inhaler INHALE 1 PUFF BY MOUTH EVERY 6 HOURS AS NEEDED FOR SHORTNESS OF BREATH FOR WHEEZING Patient taking differently: Inhale 1-2 puffs into the lungs every 6 (six) hours as needed for shortness of breath or wheezing. 04/12/23   Scoggins, Hospital doctor, NP     Family History  Problem Relation Age of Onset   Melanoma Father 28   Cirrhosis Maternal Grandfather    Breast cancer Paternal Grandmother     Social History   Socioeconomic History   Marital status: Widowed    Spouse name: Not on file   Number of children: 1   Years of education: Not on file   Highest education level: Not on file  Occupational History   Not on file  Tobacco Use   Smoking status: Every Day  Types: Cigarettes   Smokeless tobacco: Never   Tobacco comments:    3-4 cigarettes a day  Vaping Use   Vaping status: Never Used  Substance and Sexual Activity   Alcohol use: Never    Alcohol/week: 3.0 standard drinks of alcohol    Types: 3 Standard drinks or equivalent per week   Drug use: Never   Sexual activity: Not Currently    Birth control/protection: None  Other Topics Concern   Not on file  Social History Narrative   Lives with mother   Social Drivers of Health   Financial Resource Strain: Low Risk  (01/06/2022)   Overall Financial Resource Strain (CARDIA)    Difficulty of Paying Living Expenses: Not very hard  Food Insecurity: No Food Insecurity (07/09/2023)   Hunger Vital Sign    Worried About Running Out of Food in the Last Year: Never true    Ran Out of Food in the Last Year: Never true   Transportation Needs: No Transportation Needs (07/09/2023)   PRAPARE - Administrator, Civil Service (Medical): No    Lack of Transportation (Non-Medical): No  Physical Activity: Inactive (01/06/2022)   Exercise Vital Sign    Days of Exercise per Week: 0 days    Minutes of Exercise per Session: 0 min  Stress: Stress Concern Present (01/06/2022)   [employer] of Occupational Health - Occupational Stress Questionnaire    Feeling of Stress : Rather much  Social Connections: Moderately Isolated (07/01/2023)   Social Connection and Isolation Panel    Frequency of Communication with Friends and Family: Three times a week    Frequency of Social Gatherings with Friends and Family: Three times a week    Attends Religious Services: 1 to 4 times per year    Active Member of Clubs or Organizations: No    Attends Banker Meetings: Never    Marital Status: Widowed     Review of Systems: A 12 point ROS discussed and pertinent positives are indicated in the HPI above.  All other systems are negative.  Vital Signs: LMP 11/19/2002 (Approximate)   Advance Care Plan: The advanced care place/surrogate decision maker was discussed at the time of visit and the patient did not wish to discuss or was not able to name a surrogate decision maker or provide an advance care plan.  Physical Exam Constitutional:      General: She is not in acute distress.    Appearance: Normal appearance.  HENT:     Mouth/Throat:     Mouth: Mucous membranes are moist.  Cardiovascular:     Rate and Rhythm: Normal rate and regular rhythm.     Pulses: Normal pulses.  Pulmonary:     Effort: Pulmonary effort is normal.     Breath sounds: Normal breath sounds.  Abdominal:     General: Abdomen is flat.     Tenderness: There is abdominal tenderness.     Comments: Subcostal tenderness to palpation, bilaterally.   Musculoskeletal:        General: Normal range of motion.  Skin:    General: Skin  is warm and dry.  Neurological:     Mental Status: She is alert and oriented to person, place, and time.  Psychiatric:        Mood and Affect: Mood normal.        Behavior: Behavior normal.        Thought Content: Thought content normal.  Judgment: Judgment normal.     Imaging: No results found.  Labs:  CBC: Recent Labs    07/09/23 0510 07/10/23 0544 07/24/23 1354 09/13/23 1459  WBC 7.8 7.6 11.9* 9.8  HGB 7.9* 8.4* 10.1* 11.6*  HCT 23.7* 26.3* 31.2* 34.9*  PLT 281 296 533* 450*    COAGS: Recent Labs    09/13/23 1459  INR 1.0  APTT 37*    BMP: Recent Labs    07/11/23 0914 07/12/23 0511 07/24/23 1354 09/13/23 1500  NA 135 136 137 130*  K 4.1 3.6 3.6 3.1*  CL 103 104 103 96*  CO2 19* 21* 22 23  GLUCOSE 76 108* 121* 121*  BUN 7* <5* 10 12  CALCIUM  7.2* 6.6* 8.7* 9.2  CREATININE 0.36* 0.35* 0.69 0.66  GFRNONAA >60 >60 >60 >60    LIVER FUNCTION TESTS: Recent Labs    04/14/23 1326 06/28/23 1103 07/24/23 1354 09/13/23 1500  BILITOT 0.6 0.5 0.4 0.4  AST 18 22 16  13*  ALT 10 26 11 9   ALKPHOS 74 81 71 125  PROT 6.6 7.0 6.7 7.7  ALBUMIN  3.7 3.9 3.8 3.6    TUMOR MARKERS: No results for input(s): AFPTM, CEA, CA199, CHROMGRNA in the last 8760 hours.  Assessment and Plan: Per Dr. Layvonne progress note on 7/2: Recent CT and PET scan results were reviewed with patient.  Concern for metastatic cancer recurrence. Patient has history of triple negative breast cancer, lung cancer, rectal cancer and has finished definitive treatments.  Suspect she has developed metastatic lung cancer. Recommend patient to get CT-guided liver biopsy to establish tissue diagnosis.  Patient has previously no showed to biopsy and today she agrees with proceeding with the plan.  Patient presents for scheduled liver lesion biopsy in IR today.  Patient has been NPO since midnight.  All labs and medications are within acceptable parameters.  Dr. Philip is aware of  patient haven taken 400 mg ibuprofen  this am, and is willing to proceed. No pertinent allergies.   Risks and benefits of liver lesion biopsy was discussed with the patient and/or patient's family including, but not limited to bleeding, infection, damage to adjacent structures or low yield requiring additional tests.  All of the questions were answered and there is agreement to proceed.  Consent signed and in chart.     Thank you for allowing our service to participate in NASEEM ADLER 's care.  Electronically Signed: Carlin DELENA Griffon, PA-C   09/25/2023, 5:50 PM      I spent a total of 30 Minutes in face to face in clinical consultation, greater than 50% of which was counseling/coordinating care for liver lesion, with consideration for biopsy.

## 2023-09-26 ENCOUNTER — Other Ambulatory Visit: Payer: Self-pay

## 2023-09-26 ENCOUNTER — Ambulatory Visit
Admission: RE | Admit: 2023-09-26 | Discharge: 2023-09-26 | Disposition: A | Discharge status: Home / Self Care | Source: Ambulatory Visit | Attending: Oncology | Admitting: Oncology

## 2023-09-26 DIAGNOSIS — J449 Chronic obstructive pulmonary disease, unspecified: Secondary | ICD-10-CM | POA: Diagnosis not present

## 2023-09-26 DIAGNOSIS — C787 Secondary malignant neoplasm of liver and intrahepatic bile duct: Secondary | ICD-10-CM | POA: Diagnosis not present

## 2023-09-26 DIAGNOSIS — F17210 Nicotine dependence, cigarettes, uncomplicated: Secondary | ICD-10-CM | POA: Insufficient documentation

## 2023-09-26 DIAGNOSIS — K769 Liver disease, unspecified: Secondary | ICD-10-CM | POA: Diagnosis present

## 2023-09-26 DIAGNOSIS — Z85048 Personal history of other malignant neoplasm of rectum, rectosigmoid junction, and anus: Secondary | ICD-10-CM | POA: Insufficient documentation

## 2023-09-26 DIAGNOSIS — Z9221 Personal history of antineoplastic chemotherapy: Secondary | ICD-10-CM | POA: Insufficient documentation

## 2023-09-26 DIAGNOSIS — Z853 Personal history of malignant neoplasm of breast: Secondary | ICD-10-CM | POA: Insufficient documentation

## 2023-09-26 DIAGNOSIS — Z85118 Personal history of other malignant neoplasm of bronchus and lung: Secondary | ICD-10-CM | POA: Insufficient documentation

## 2023-09-26 LAB — CBC
HCT: 38.2 % (ref 36.0–46.0)
Hemoglobin: 12.8 g/dL (ref 12.0–15.0)
MCH: 28.4 pg (ref 26.0–34.0)
MCHC: 33.5 g/dL (ref 30.0–36.0)
MCV: 84.9 fL (ref 80.0–100.0)
Platelets: 403 K/uL — ABNORMAL HIGH (ref 150–400)
RBC: 4.5 MIL/uL (ref 3.87–5.11)
RDW: 13.9 % (ref 11.5–15.5)
WBC: 21.5 K/uL — ABNORMAL HIGH (ref 4.0–10.5)
nRBC: 0 % (ref 0.0–0.2)

## 2023-09-26 LAB — PROTIME-INR
INR: 1.1 (ref 0.8–1.2)
Prothrombin Time: 14.5 s (ref 11.4–15.2)

## 2023-09-26 MED ORDER — MIDAZOLAM HCL 2 MG/2ML IJ SOLN
INTRAMUSCULAR | Status: AC
  Filled 2023-09-26: qty 2

## 2023-09-26 MED ORDER — LIDOCAINE HCL (PF) 1 % IJ SOLN
20.0000 mL | Freq: Once | INTRAMUSCULAR | Status: AC
  Administered 2023-09-26: 20 mL
  Filled 2023-09-26: qty 20

## 2023-09-26 MED ORDER — FENTANYL CITRATE (PF) 100 MCG/2ML IJ SOLN
INTRAMUSCULAR | Status: AC
  Filled 2023-09-26: qty 2

## 2023-09-26 MED ORDER — SODIUM CHLORIDE 0.9 % IV SOLN: INTRAVENOUS | Status: DC

## 2023-09-26 MED ORDER — FENTANYL CITRATE (PF) 100 MCG/2ML IJ SOLN
INTRAMUSCULAR | Status: AC | PRN
  Administered 2023-09-26 (×2): 50 ug via INTRAVENOUS

## 2023-09-26 MED ORDER — MIDAZOLAM HCL 5 MG/5ML IJ SOLN
INTRAMUSCULAR | Status: AC | PRN
  Administered 2023-09-26 (×2): 1 mg via INTRAVENOUS

## 2023-09-26 NOTE — Progress Notes (Signed)
 Patient clinically stable post US  Liver biopsy per Dr Philip, tolerated well. Patient apparently very tender at mid/right abdomen pre procedure at liver lesion area. Vitals stable pre and post procedure. Denies complaints of pain immediately post procedure. Received Versed  2 mg along with Fentanyl  100 mcg IV for procedure. Report given to Grayce Ann Rn post procedure/specials/10

## 2023-09-26 NOTE — Procedures (Signed)
 Interventional Radiology Procedure:   Indications: Liver lesions.  History of breast, lung and rectal cancer  Procedure: US  guided liver lesion biopsy  Findings: 3 core biopsies obtained from lesion adjacent to gallbladder, probably segement 4B.  Gelfoam slurry injected along needle tract.   Complications: None     EBL: Minimal  Plan: Bedrest 2 hours, then discharge to home.   Jareth Pardee R. Philip, MD  Pager: [phone]

## 2023-09-27 ENCOUNTER — Telehealth: Payer: Self-pay | Admitting: *Deleted

## 2023-09-27 LAB — SURGICAL PATHOLOGY

## 2023-09-27 NOTE — Telephone Encounter (Signed)
 Patient says the only thing that made her better with pain is the hydrocodone  and she also wants another prednisone  pak 21 tablets.  Says with those 2 things she feels like she will get better.  She please wants to have the medicine put in today so she can be starting it this evening

## 2023-09-27 NOTE — Telephone Encounter (Signed)
 Please move up appt with Josh to tomorrow for further pain management disucssion. Ok with Ph visit. Please notify pt of appt.

## 2023-09-28 ENCOUNTER — Encounter: Payer: Self-pay | Admitting: Oncology

## 2023-09-28 ENCOUNTER — Inpatient Hospital Stay: Admitting: Hospice and Palliative Medicine

## 2023-09-28 DIAGNOSIS — C50911 Malignant neoplasm of unspecified site of right female breast: Secondary | ICD-10-CM | POA: Diagnosis not present

## 2023-09-28 DIAGNOSIS — Z515 Encounter for palliative care: Secondary | ICD-10-CM

## 2023-09-28 DIAGNOSIS — C3491 Malignant neoplasm of unspecified part of right bronchus or lung: Secondary | ICD-10-CM | POA: Diagnosis not present

## 2023-09-28 MED ORDER — HYDROCODONE-ACETAMINOPHEN 5-325 MG PO TABS: 1.0000 | ORAL_TABLET | Freq: Four times a day (QID) | ORAL | 0 refills | Status: DC | PRN

## 2023-09-28 MED ORDER — PREDNISONE 10 MG (21) PO TBPK
ORAL_TABLET | ORAL | 0 refills | Status: DC
Start: 2023-09-28 — End: 2023-10-05

## 2023-09-28 NOTE — Progress Notes (Signed)
 Virtual Visit via Telephone Note  I connected with Yvonne Scott on 09/28/23 at  2:00 PM EDT by telephone and verified that I am speaking with the correct person using two identifiers.  Location: Patient: Home Provider: Clinic   I discussed the limitations, risks, security and privacy concerns of performing an evaluation and management service by telephone and the availability of in person appointments. I also discussed with the patient that there may be a patient responsible charge related to this service. The patient expressed understanding and agreed to proceed.   History of Present Illness: Yvonne Scott is a 63 year old woman with multiple medical problems including triple negative right breast cancer, squamous cell lung cancer, and rectal cancer.   Observations/Objective: Recent CT and PET results were concerning for metastatic cancer recurrence.  Patient underwent liver biopsy on 09/26/2023 which showed metastatic carcinoma but immunohistochemical stains are pending.  I spoke with patient by phone.  She complains of generalized pain unrelieved with tramadol .  She says the tramadol  is making her feel nauseated.  She says that she has previously taken Norco and oxycodone  with good effect.  She requests refill of Norco.  Patient also requests refill of steroids.  I note that she was recently prescribed prednisone  taper for possible COPD exacerbation.  Patient reports resolution of pulmonary symptoms.  However, she says that she felt improved fatigue and appetite while taking the steroids and asks if dose can be refilled.  We discussed the limitations of chronic steroid use.  Discussed with Dr. Babara and will refill prednisone  taper.   Assessment and Plan: Recurrent metastatic cancer -patient has follow-up next week with Dr. Babara  Neoplasm related pain -restart Norco 5-325mg  Q6H PRN for pain #30.  PDMP reviewed.  Daily bowel regimen to prevent opioid-induced constipation.  Fatigue  -prednisone  taper.  Could consider low-dose dexamethasone .  Follow Up Instructions: Follow-up telephone visit 1 month   I discussed the assessment and treatment plan with the patient. The patient was provided an opportunity to ask questions and all were answered. The patient agreed with the plan and demonstrated an understanding of the instructions.   The patient was advised to call back or seek an in-person evaluation if the symptoms worsen or if the condition fails to improve as anticipated.  I provided 10 minutes of non-face-to-face time during this encounter.   FONDA JONELLE MOWER, NP

## 2023-09-29 ENCOUNTER — Inpatient Hospital Stay: Admitting: Hospice and Palliative Medicine

## 2023-10-05 ENCOUNTER — Telehealth: Payer: Self-pay

## 2023-10-05 ENCOUNTER — Encounter: Payer: Self-pay | Admitting: Oncology

## 2023-10-05 ENCOUNTER — Inpatient Hospital Stay (HOSPITAL_BASED_OUTPATIENT_CLINIC_OR_DEPARTMENT_OTHER): Admitting: Oncology

## 2023-10-05 VITALS — BP 146/73 | HR 92 | Temp 98.6°F | Resp 20 | Wt 106.8 lb

## 2023-10-05 DIAGNOSIS — G893 Neoplasm related pain (acute) (chronic): Secondary | ICD-10-CM

## 2023-10-05 DIAGNOSIS — C3491 Malignant neoplasm of unspecified part of right bronchus or lung: Secondary | ICD-10-CM | POA: Diagnosis not present

## 2023-10-05 DIAGNOSIS — C50911 Malignant neoplasm of unspecified site of right female breast: Secondary | ICD-10-CM | POA: Diagnosis not present

## 2023-10-05 DIAGNOSIS — C20 Malignant neoplasm of rectum: Secondary | ICD-10-CM

## 2023-10-05 DIAGNOSIS — Z720 Tobacco use: Secondary | ICD-10-CM

## 2023-10-05 DIAGNOSIS — K219 Gastro-esophageal reflux disease without esophagitis: Secondary | ICD-10-CM | POA: Diagnosis not present

## 2023-10-05 DIAGNOSIS — C7931 Secondary malignant neoplasm of brain: Secondary | ICD-10-CM

## 2023-10-05 MED ORDER — OMEPRAZOLE 40 MG PO CPDR: 40.0000 mg | DELAYED_RELEASE_CAPSULE | Freq: Every day | ORAL | 0 refills | Status: DC

## 2023-10-05 MED ORDER — HYDROCODONE-ACETAMINOPHEN 5-325 MG PO TABS: 1.0000 | ORAL_TABLET | Freq: Four times a day (QID) | ORAL | 0 refills | Status: DC | PRN

## 2023-10-05 MED ORDER — MORPHINE SULFATE ER 15 MG PO TBCR: 15.0000 mg | EXTENDED_RELEASE_TABLET | Freq: Two times a day (BID) | ORAL | 0 refills | Status: DC

## 2023-10-05 MED ORDER — DEXAMETHASONE 1 MG PO TABS: 2.0000 mg | ORAL_TABLET | Freq: Two times a day (BID) | ORAL | 0 refills | Status: DC

## 2023-10-05 NOTE — Assessment & Plan Note (Signed)
 pTis N0 disease.  Status post lower anterior resection, s/p colostomy reversal.   CEA is normal. Pending additional IHC staining from liver biopsy to determine tissue origin.

## 2023-10-05 NOTE — Assessment & Plan Note (Signed)
Smoke cessation was discussed with patient.

## 2023-10-05 NOTE — Assessment & Plan Note (Addendum)
 Clinical stage III triple negative right breast cancer. cT3 cN0-1 S/p  neoadjuvant carboplatin  Taxol  Keytruda  followed by [facility] with Keytruda  x 3.   4th cycle of AC with keytruda  was not given due to poor tolerance. Status post right mastectomy with sentinel lymph node biopsy. ypT2 ypN0, residual disease, Patient declines any adjuvant chemotherapy or immunotherapy Genetic counseling showed DICER VUS  Tumor marker is rising. Liver biopsy showed metastatic carcinoma.  ER 30% positive, PR negative, HER2 negative.  Liquid NGS showed PTEN mutation.  Will check tissue biopsy NGS.   Given that her disease rapidly progressing as evidenced by visceral crisis, low ER breast carcinoma, recommend palliative chemotherapy with carboplatin  AUC of 5 and weekly Taxol .  Patient agrees with the plan.

## 2023-10-05 NOTE — Assessment & Plan Note (Signed)
 Bronchoscopy biopsy of right upper lobe nodule showed squamous cell carcinoma. Other lung nodule was not able to be biopsied due to location and extract for pneumothorax. S/p  lung SBRT.    Recent CT and PET scan results were reviewed with patient.  Concern for metastatic cancer recurrence. Patient has history of triple negative breast cancer, lung cancer, rectal cancer and has finished definitive treatments.  Suspect she has developed metastatic lung cancer.

## 2023-10-05 NOTE — Progress Notes (Signed)
 Hematology/Oncology Progress note [phone] [phone]       CHIEF COMPLAINTS/REASON FOR VISIT:  Follow-up for triple negative right breast cancer, squamous cell lung cancer, rectal Stage 0 adenocarcinoma   ASSESSMENT & PLAN:   Cancer Staging  Invasive ductal carcinoma of right breast (HCC) Staging form: Breast, AJCC 8th Edition - Pathologic stage from 10/19/2021: ypT2, ypN0, cM0, G3, ER-, PR-, HER2- - Signed by Yvonne Call, MD on 11/03/2021  Rectal adenocarcinoma [facility]) Staging form: Colon and Rectum, AJCC 8th Edition - Clinical stage from 05/15/2023: Stage 0 (cTis, cN0, cM0) - Signed by Yvonne Call, MD on 05/15/2023  Squamous cell lung cancer, right [facility]) Staging form: Lung, AJCC 8th Edition - Clinical: cT1, cN0, cM0 - Signed by Yvonne Call, MD on 01/10/2023   Invasive ductal carcinoma of right breast (HCC) Clinical stage III triple negative right breast cancer. cT3 cN0-1 S/p  neoadjuvant carboplatin  Taxol  Keytruda  followed by [facility] with Keytruda  x 3.   4th cycle of AC with keytruda  was not given due to poor tolerance. Status post right mastectomy with sentinel lymph node biopsy. ypT2 ypN0, residual disease, Patient declines any adjuvant chemotherapy or immunotherapy Genetic counseling showed DICER VUS  Tumor marker is rising. Liver biopsy showed metastatic carcinoma.  ER 30% positive, PR negative, HER2 negative.  Liquid NGS showed PTEN mutation.  Will check tissue biopsy NGS.   Given that her disease rapidly progressing as evidenced by visceral crisis, low ER breast carcinoma, recommend palliative chemotherapy with carboplatin  AUC of 5 and weekly Taxol .  Patient agrees with the plan.   Rectal adenocarcinoma (HCC) pTis N0 disease.  Status post lower anterior resection, s/p colostomy reversal.   CEA is normal. Pending additional IHC staining from liver biopsy to determine tissue origin.  Squamous cell lung cancer, right (HCC) Bronchoscopy biopsy of right upper lobe  nodule showed squamous cell carcinoma. Other lung nodule was not able to be biopsied due to location and extract for pneumothorax. S/p  lung SBRT.    Recent CT and PET scan results were reviewed with patient.  Concern for metastatic cancer recurrence. Patient has history of triple negative breast cancer, lung cancer, rectal cancer and has finished definitive treatments.  Suspect she has developed metastatic lung cancer.   Neoplasm related pain Continue Norco 5/325.  4 to 6 hours..  Mild to morphine  15 mg twice daily. Recommend dexamethasone  8 mg daily  Tobacco abuse Smoke cessation was discussed with patient.   Metastasis to brain [name]) Will arrange patient to reestablish care with radiation oncology for further evaluation.      Orders Placed This Encounter  Procedures   MR Brain W Wo Contrast    Standing Status:   Future    Expected Date:   10/09/2023    Expiration Date:   10/04/2024    If indicated for the ordered procedure, I authorize the administration of contrast media per Radiology protocol:   Yes    What is the patient's sedation requirement?:   No Sedation    Does the patient have a pacemaker or implanted devices?:   No    Use SRS Protocol?:   Yes    Preferred imaging location?:   [facility] (table limit - 500lbs)    All questions were answered. The patient knows to Scott the clinic with any problems, questions or concerns.  Scott Babara, MD, PhD [facility] Hematology Oncology 10/05/2023        HISTORY OF PRESENTING ILLNESS:   Yvonne Scott is a  63 y.o.  female presents for follow-up of triple negative breast cancer, lung squamous cancer and rectal cancer.  Oncology History  Invasive ductal carcinoma of right breast (HCC)  11/11/2020 Genetic Testing   Genetic testing done at [facility].  Per note, negative.   02/20/2021 Initial Diagnosis   Invasive ductal carcinoma of right breast (HCC)\  -August 2022, patient was diagnosed with right breast stage IIIb  cT3 N0M0, grade 2, ER negative, PR negative HER2 negative breast cancer.  Patient self palpated breast mass many years ago.  10/21/2020 diagnostic mammogram and ultrasound confirmed presence of mass and a subsequent biopsy revealed right breast triple negative cancer. There was plan for patient to start with neoadjuvant chemotherapy followed by surgery and radiation.  Unfortunately patient never started treatment.    Medi port was placed at [facility].  Established care with me on 02/19/2021    02/24/2021 Imaging   CT chest abdomen pelvis showed large right breast mass, 5.5 x 4.8 cm with small right axillary lymph node with variable degrees of enhancement potentially involved but not specific based on size.  This tracked down to retropectoral nodal stations largest lymph node along the lateral margin of the pectoralis major.  Tiny pulmonary nodules in the chest as discussed.  These are nonspecific but would consider short interval follow-up to assess for any changes.  No evidence of metastatic disease involving the abdomen or pelvis.  Aortic atherosclerosis.   02/25/2021 Echocardiogram   echocardiogram showed LVEF 65 to 70%.  Patient has been to chemotherapy class and understands antiemetics instructions   03/11/2021 - 08/26/2021 Chemotherapy   BREAST Pembrolizumab  (200) D1 + Carboplatin  (5) D1 + Paclitaxel  (80) D1,8,15 q21d X 4 cycles, followed by Pembrolizumab  (200) D1 + AC D1 q21d x 3 cycles. 4th cycle was omitted due to poor tolerance.     03/16/2021 Imaging   bone scan showed no evidence of osseous metastatic breast cancer, asymmetric osseous uptake on the right at L5-S1, corresponding to degenerative disc disease on recent CT.  Asymmetric soft tissue uptake in the right breast   07/13/2021 Echocardiogram   Echocardiogram showed LVEF 60-65%.  Grade 1 diastolic dysfunction.  Refer details to echocardiogram reports.   10/19/2021 Surgery   Patient underwent right modified radical mastectomy  Pathology  showed invasive mammary carcinoma, no special type, multifocal, with focal chondroid stroma.  Clip and biopsy sites identified.  12 lymph nodes negative for malignancy.  2 lymph nodes displaying dense fibrous scarring, compatible with pathological complete response.  Benign nipple/areola.   Grade 3, LVI not identified, DCIS not identified.  All margins negative for invasive carcinoma.   ypT2 ypN0  Comment Due to the presence of dense fibrosis and treatment effect in two of the sampled lymph nodes, pancytokeratin stains were performed on all submitted lymph nodes.  All lymph nodes are negative for residual metastatic carcinoma.  A separate focus of invasive carcinoma, measuring approximately 11 mm in greatest extent, and histologically similar to the primary tumor, is identified within sampling of the fibrous tissue adjacent to the primary tumor.    10/19/2021 Cancer Staging   Staging form: Breast, AJCC 8th Edition - Pathologic stage from 10/19/2021: No Stage Recommended (ypT2, pN0, cM0, G3, ER-, PR-, HER2-) - Signed by Yvonne Call, MD on 11/03/2021 Stage prefix: Post-therapy Response to neoadjuvant therapy: Partial response Multigene prognostic tests performed: None Histologic grading system: 3 grade system   05/27/2022 Imaging   CT chest abdomen pelvis with contrast showed 1. Interval right mastectomy with resolution  of suspicious right axillary nodes. 2. Enlargement of a 5 mm right lower lobe pulmonary nodule. Development of a 5 mm left upper lobe pulmonary nodule. Cannot exclude early metastatic disease. Consider chest CT follow-up at 3 months. 3. Otherwise, no evidence of metastatic disease in the chest, abdomen, or pelvis. 4. Age advanced coronary artery atherosclerosis. Recommend assessment of coronary risk factors. 5. Aortic atherosclerosis (ICD10-I70.0) and emphysema (ICD10-J43.9). 6. Mild enlargement of the common duct, which is now minimally dilated for age. Correlate with bilirubin  levels. If elevated,consider ERCP.   09/09/2022 Imaging   CT chest without contrast showed 1. Interval growth of three solid bilateral pulmonary nodules since 05/25/2022 chest CT, largest 0.9 cm in the anteromedial left upper lobe, highly suspicious for pulmonary metastases. 2. No thoracic adenopathy. 3. Three-vessel coronary atherosclerosis. 4. Aortic Atherosclerosis (ICD10-I70.0) and Emphysema (ICD10-J43.9).     09/29/2022 Imaging   PET scan shows 1. Hypermetabolic bilateral pulmonary nodules, consistent with metastasis. 2. No other evidence of tracer avid metastatic disease, status post right mastectomy. 3. Hypermetabolism with suggestion of soft tissue fullness in the mid rectum, suspicious for polyp or carcinoma. Consider further evaluation with sigmoidoscopy. 4. Incidental findings, including: Aortic atherosclerosis (ICD10-I70.0), coronary artery atherosclerosis and emphysema (ICD10-J43.9).   08/01/2023 Imaging   CT chest abdomen pelvis with contrast  Chest:   1. Interval increase in multiple pulmonary nodules which are suspicious for pulmonary metastatic disease. The largest is in the medial left upper lobe and measures 2.2 cm. 2. Left hilar and mediastinal lymphadenopathy is present with the largest left hilar lymph node measuring 2.1 cm.   Abdomen and pelvis:   1. Progression of liver metastatic disease from July 01, 2023 and most notably from May 25, 2022. The largest liver lesion is estimated at 2.1 cm in largest dimension.   08/11/2023 Imaging   PET scan showed Overall progression of disease.   As seen on the recent CT scan hypermetabolic lymph nodes the left side of the mediastinum, AP window and hilum. Additional smaller focus seen in the upper anterior mediastinum at the level of the manubrium.   Developing multiple hypermetabolic lung nodules including several which are juxtapleural including the dominant lesion medially along the left upper lobe  anteriorly.   Development of multiple hypermetabolic liver lesions. At least 8 lesions are seen.   Focal nodular area of wall thickening along the anterior aspect of the antrum of the stomach with abnormal uptake. In addition there is adjacent mesenteric nodules just caudal to the antrum of the stomach which could represent peritoneal nodules versus abnormal lymph nodes.   Four hypermetabolic bone lesions consistent with osseous metastatic disease. Areas include right iliac bone above the acetabulum as well as along the spine at T11, T12 and L5.   Surgical changes. There is a small subcutaneous nodule at the level of the pelvis in the midline anteriorly with some minimal uptake. Attention on follow-up.     Squamous cell lung cancer, right (HCC)  08/12/2022 Initial Diagnosis   Squamous cell lung cancer, right (HCC)   01/10/2023 Cancer Staging   Staging form: Lung, AJCC 8th Edition - Clinical: cT1, cN0, cM0 - Signed by Yvonne Call, MD on 01/10/2023 Stage prefix: Initial diagnosis   08/01/2023 Imaging   CT chest abdomen pelvis with contrast  Chest:   1. Interval increase in multiple pulmonary nodules which are suspicious for pulmonary metastatic disease. The largest is in the medial left upper lobe and measures 2.2 cm. 2. Left hilar and mediastinal lymphadenopathy  is present with the largest left hilar lymph node measuring 2.1 cm.   Abdomen and pelvis:   1. Progression of liver metastatic disease from July 01, 2023 and most notably from May 25, 2022. The largest liver lesion is estimated at 2.1 cm in largest dimension.   08/11/2023 Imaging   PET scan showed Overall progression of disease.   As seen on the recent CT scan hypermetabolic lymph nodes the left side of the mediastinum, AP window and hilum. Additional smaller focus seen in the upper anterior mediastinum at the level of the manubrium.   Developing multiple hypermetabolic lung nodules including several which  are juxtapleural including the dominant lesion medially along the left upper lobe anteriorly.   Development of multiple hypermetabolic liver lesions. At least 8 lesions are seen.   Focal nodular area of wall thickening along the anterior aspect of the antrum of the stomach with abnormal uptake. In addition there is adjacent mesenteric nodules just caudal to the antrum of the stomach which could represent peritoneal nodules versus abnormal lymph nodes.   Four hypermetabolic bone lesions consistent with osseous metastatic disease. Areas include right iliac bone above the acetabulum as well as along the spine at T11, T12 and L5.   Surgical changes. There is a small subcutaneous nodule at the level of the pelvis in the midline anteriorly with some minimal uptake. Attention on follow-up.     Rectal adenocarcinoma (HCC)  10/13/2022 Initial Diagnosis   Rectal adenocarcinoma (HCC)  patient has large polyp and biopsy showed rectal adenocarcinoma-at least intramucosal. She was also recommended to have bronchoscopy ASAP for further evaluation of the lung nodules.  Patient adamantly declined pulmonology workup concurrently with colonoscopy.  She eventually underwent pulmonology workup with bronchoscopy biopsy of right upper lobe nodule and the results showed squamous cell carcinoma.   lingula nodule was not biopsied due to location and threat for pneumothorax.  Denies hematochezia,  black tarry stool or abdominal pain, change of bowel habits.   01/16/2023 Imaging   MRI pelvis without contrast showed 4.1 cm upper/mid rectal mass, as above.   Rectal adenocarcinoma T stage: T2.   Rectal adenocarcinoma N stage: N0.   Distance from tumor to the internal anal sphincter is 7.0 cm.   04/21/2023 Surgery   Patient underwent distal sigmoid colon and rectum low anterior resection Pathology showed Tubulovillous adenoma with high-grade dysplasia/intramucosal carcinoma  (pTis)  Negative for invasion   Tumor measures 2.6 x 2.3 x 2.1 cm  Margins free of adenomatous change and carcinoma  Eight benign perirectal lymph nodes (0/8, pN0  Final distal margin benign: Negative for dysplasia and carcinoma.    05/15/2023 Cancer Staging   Staging form: Colon and Rectum, AJCC 8th Edition - Clinical stage from 05/15/2023: Stage 0 (cTis, cN0, cM0) - Signed by Yvonne Call, MD on 05/15/2023 Stage prefix: Initial diagnosis Total positive nodes: 0   07/07/2023 Surgery   diverting loop ileostomy take down   08/01/2023 Imaging   CT chest abdomen pelvis with contrast  Chest:   1. Interval increase in multiple pulmonary nodules which are suspicious for pulmonary metastatic disease. The largest is in the medial left upper lobe and measures 2.2 cm. 2. Left hilar and mediastinal lymphadenopathy is present with the largest left hilar lymph node measuring 2.1 cm.   Abdomen and pelvis:   1. Progression of liver metastatic disease from July 01, 2023 and most notably from May 25, 2022. The largest liver lesion is estimated at 2.1 cm in largest dimension.  08/11/2023 Imaging   PET scan showed Overall progression of disease.   As seen on the recent CT scan hypermetabolic lymph nodes the left side of the mediastinum, AP window and hilum. Additional smaller focus seen in the upper anterior mediastinum at the level of the manubrium.   Developing multiple hypermetabolic lung nodules including several which are juxtapleural including the dominant lesion medially along the left upper lobe anteriorly.   Development of multiple hypermetabolic liver lesions. At least 8 lesions are seen.   Focal nodular area of wall thickening along the anterior aspect of the antrum of the stomach with abnormal uptake. In addition there is adjacent mesenteric nodules just caudal to the antrum of the stomach which could represent peritoneal nodules versus abnormal lymph nodes.   Four hypermetabolic bone lesions consistent with  osseous metastatic disease. Areas include right iliac bone above the acetabulum as well as along the spine at T11, T12 and L5.   Surgical changes. There is a small subcutaneous nodule at the level of the pelvis in the midline anteriorly with some minimal uptake. Attention on follow-up.      07/16/2021-07/19/2021, patient was admitted for HCAP patient was treated with antibiotics 09/04/2021 - 09/08/2021 Patient was admitted due to pancytopenia, thrush electrolyte abnormality.    S/p diverting loop ileostomy take down  INTERVAL HISTORY Yvonne Scott is a 63 y.o. female who has above history reviewed by me today presents for follow up visit for management of triple negative breast cancer, lung squamous cell carcinoma, stage 0 rectal adenocarcinoma   Patient continues to smoke on some days.   She reports feeling poorly. Today she presents for follow-up, accompanied by her mother. She has poor appetite and has lost weight.  No fever, chills, nausea vomiting diarrhea blood in the stool.  Patient has developed diffuse body pain. Breathing has improved after tapering course of steroids.  She also felt steroid helped with her bone pain and requesting refill.   Review of Systems  Constitutional:  Positive for appetite change and fatigue. Negative for chills, fever and unexpected weight change.  HENT:   Negative for hearing loss, mouth sores and voice change.   Eyes:  Negative for eye problems.  Respiratory:  Negative for chest tightness and cough.   Cardiovascular:  Negative for chest pain.  Gastrointestinal:  Negative for abdominal distention, abdominal pain, blood in stool, diarrhea and nausea.  Endocrine: Negative for hot flashes.  Genitourinary:  Negative for difficulty urinating, dysuria and frequency.   Musculoskeletal:  Positive for arthralgias and back pain.  Skin:  Negative for itching and rash.  Neurological:  Positive for numbness. Negative for extremity weakness.  Hematological:   Negative for adenopathy.  Psychiatric/Behavioral:  Negative for confusion. The patient is nervous/anxious.     MEDICAL HISTORY:  Past Medical History:  Diagnosis Date   Anxiety    Aortic atherosclerosis (HCC) 10/24/2016   Breast cancer (HCC)    colon cancer  and rectal   COPD (chronic obstructive pulmonary disease) (HCC)    Depression    Dyspnea    GERD (gastroesophageal reflux disease)    Hypokalemia    Hypomagnesemia    Invasive ductal carcinoma of right breast (HCC)    Metabolic acidosis    Personal history of chemotherapy    Pneumonia    Severe protein-calorie malnutrition (HCC)    Thrombocytopenia (HCC)     SURGICAL HISTORY: Past Surgical History:  Procedure Laterality Date   BREAST BIOPSY Right 2022   BRONCHIAL BIOPSY  12/29/2022   Procedure: BRONCHIAL BIOPSIES;  Surgeon: Isadora Hose, MD;  Location: [facility] ENDOSCOPY;  Service: Pulmonary;;   BRONCHIAL NEEDLE ASPIRATION BIOPSY  12/29/2022   Procedure: BRONCHIAL NEEDLE ASPIRATION BIOPSIES;  Surgeon: Isadora Hose, MD;  Location: [facility] ENDOSCOPY;  Service: Pulmonary;;   COLONOSCOPY WITH PROPOFOL  N/A 12/07/2022   Procedure: COLONOSCOPY WITH PROPOFOL ;  Surgeon: Unk Corinn Skiff, MD;  Location: [facility] ENDOSCOPY;  Service: Gastroenterology;  Laterality: N/A;   COLOSTOMY TAKEDOWN N/A 07/07/2023   Procedure: TAKEDOWN OF LOOP ILEOSTOMY;  Surgeon: Teresa Lonni HERO, MD;  Location: [facility] ORS;  Service: General;  Laterality: N/A;  TAKEDOWN OF LOOP ILESTOMY   FLEXIBLE SIGMOIDOSCOPY N/A 04/21/2023   Procedure: FLEXIBLE SIGMOIDOSCOPY, ICG PERFUSION;  Surgeon: Teresa Lonni HERO, MD;  Location: [facility] ORS;  Service: General;  Laterality: N/A;   FLEXIBLE SIGMOIDOSCOPY N/A 07/07/2023   Procedure: KINGSTON SIDE;  Surgeon: Teresa Lonni HERO, MD;  Location: [facility] ORS;  Service: General;  Laterality: N/A;  DIAGNOSTIC FLEXIBLE SIGMOIDOSCOPY   FOOT SURGERY Left    little toe correction   MASTECTOMY W/ SENTINEL NODE BIOPSY Right  10/19/2021   Procedure: MASTECTOMY WITH SENTINEL LYMPH NODE BIOPSY ( modified radical);  Surgeon: Rodolph Romano, MD;  Location: [facility] ORS;  Service: General;  Laterality: Right;   POLYPECTOMY  12/07/2022   Procedure: POLYPECTOMY;  Surgeon: Unk Corinn Skiff, MD;  Location: [facility] ENDOSCOPY;  Service: Gastroenterology;;   PORTA CATH INSERTION  01/19/2021   SUBMUCOSAL TATTOO INJECTION  12/07/2022   Procedure: SUBMUCOSAL TATTOO INJECTION;  Surgeon: Unk Corinn Skiff, MD;  Location: [facility] ENDOSCOPY;  Service: Gastroenterology;;   VIDEO BRONCHOSCOPY WITH ENDOBRONCHIAL ULTRASOUND N/A 12/29/2022   Procedure: VIDEO BRONCHOSCOPY WITH ENDOBRONCHIAL ULTRASOUND;  Surgeon: Isadora Hose, MD;  Location: [facility] ENDOSCOPY;  Service: Pulmonary;  Laterality: N/A;   XI ROBOTIC ASSISTED LOWER ANTERIOR RESECTION N/A 04/21/2023   Procedure: XI ROBOTIC ASSISTED LOWER ANTERIOR RESECTION, ILEOSTOMY CREATION;  Surgeon: Teresa Lonni HERO, MD;  Location: [facility] ORS;  Service: General;  Laterality: N/A;    SOCIAL HISTORY: Social History   Socioeconomic History   Marital status: Widowed    Spouse name: Not on file   Number of children: 1   Years of education: Not on file   Highest education level: Not on file  Occupational History   Not on file  Tobacco Use   Smoking status: Every Day    Types: Cigarettes   Smokeless tobacco: Never   Tobacco comments:    3-4 cigarettes a day  Vaping Use   Vaping status: Never Used  Substance and Sexual Activity   Alcohol use: Never    Alcohol/week: 3.0 standard drinks of alcohol    Types: 3 Standard drinks or equivalent per week   Drug use: Never   Sexual activity: Not Currently    Birth control/protection: None  Other Topics Concern   Not on file  Social History Narrative   Lives with mother   Social Drivers of Health   Financial Resource Strain: Low Risk  (01/06/2022)   Overall Financial Resource Strain (CARDIA)    Difficulty of Paying Living Expenses: Not  very hard  Food Insecurity: No Food Insecurity (07/09/2023)   Hunger Vital Sign    Worried About Running Out of Food in the Last Year: Never true    Ran Out of Food in the Last Year: Never true  Transportation Needs: No Transportation Needs (07/09/2023)   PRAPARE - Administrator, Civil Service (Medical): No    Lack of Transportation (Non-Medical): No  Physical Activity: Inactive (01/06/2022)   Exercise Vital Sign    Days of Exercise per Week: 0 days    Minutes of Exercise per Session: 0 min  Stress: Stress Concern Present (01/06/2022)   [employer] of Occupational Health - Occupational Stress Questionnaire    Feeling of Stress : Rather much  Social Connections: Moderately Isolated (07/01/2023)   Social Connection and Isolation Panel    Frequency of Communication with Friends and Family: Three times a week    Frequency of Social Gatherings with Friends and Family: Three times a week    Attends Religious Services: 1 to 4 times per year    Active Member of Clubs or Organizations: No    Attends Banker Meetings: Never    Marital Status: Widowed  Intimate Partner Violence: Not At Risk (07/09/2023)   Humiliation, Afraid, Rape, and Kick questionnaire    Fear of Current or Ex-Partner: No    Emotionally Abused: No    Physically Abused: No    Sexually Abused: No    FAMILY HISTORY: Family History  Problem Relation Age of Onset   Melanoma Father 55   Cirrhosis Maternal Grandfather    Breast cancer Paternal Grandmother     ALLERGIES:  has no known allergies.  MEDICATIONS:  Current Outpatient Medications  Medication Sig Dispense Refill   budesonide -formoterol  (SYMBICORT ) 160-4.5 MCG/ACT inhaler Inhale 2 puffs into the lungs 2 (two) times daily. 1 each 12   dexamethasone  (DECADRON ) 1 MG tablet Take 2 tablets (2 mg total) by mouth 2 (two) times daily with a meal. 30 tablet 0   fluticasone  (FLONASE ) 50 MCG/ACT nasal spray Place 1 spray into both nostrils daily  as needed for rhinitis or allergies.     ibuprofen  (ADVIL ) 200 MG tablet Take 200-400 mg by mouth every 6 (six) hours as needed for headache.     lidocaine -prilocaine  (EMLA ) cream APPLY TO AFFECTED AREA ONCE 30 g 0   magnesium  oxide (MAG-OX) 400 (240 Mg) MG tablet Take 800 mg by mouth daily.     morphine  (MS CONTIN ) 15 MG 12 hr tablet Take 1 tablet (15 mg total) by mouth every 12 (twelve) hours. 30 tablet 0   ondansetron  (ZOFRAN ) 8 MG tablet Take 1 tablet (8 mg total) by mouth every 8 (eight) hours as needed for nausea or vomiting. 60 tablet 0   potassium chloride  SA (KLOR-CON  M) 20 MEQ tablet Take 1 tablet (20 mEq total) by mouth daily. 3 tablet 0   VENTOLIN  HFA 108 (90 Base) MCG/ACT inhaler INHALE 1 PUFF BY MOUTH EVERY 6 HOURS AS NEEDED FOR SHORTNESS OF BREATH FOR WHEEZING (Patient taking differently: Inhale 1-2 puffs into the lungs every 6 (six) hours as needed for shortness of breath or wheezing.) 18 g 0   HYDROcodone -acetaminophen  (NORCO/VICODIN) 5-325 MG tablet Take 1 tablet by mouth every 6 (six) hours as needed for moderate pain (pain score 4-6). 30 tablet 0   naloxone (NARCAN) nasal spray 4 mg/0.1 mL Place 0.4 mg into the nose once as needed (as directed). (Patient not taking: Reported on 10/05/2023)     omeprazole  (PRILOSEC) 40 MG capsule Take 1 capsule (40 mg total) by mouth daily. 90 capsule 0   No current facility-administered medications for this visit.   Facility-Administered Medications Ordered in Other Visits  Medication Dose Route Frequency Provider Last Rate Last Admin   famotidine  (PEPCID ) 20-0.9 MG/50ML-% IVPB            sodium chloride  flush (NS) 0.9 % injection  10 mL  10 mL Intravenous PRN Yvonne Call, MD   10 mL at 09/13/23 1403     PHYSICAL EXAMINATION: ECOG PERFORMANCE STATUS: 1 - Symptomatic but completely ambulatory Vitals:   10/05/23 1403  BP: (!) 146/73  Pulse: 92  Resp: 20  Temp: 98.6 F (37 C)  SpO2: 100%   Filed Weights   10/05/23 1403  Weight: 106 lb  12.8 oz (48.4 kg)    Physical Exam Constitutional:      General: She is not in acute distress. HENT:     Head: Normocephalic and atraumatic.  Eyes:     General: No scleral icterus. Cardiovascular:     Rate and Rhythm: Normal rate.  Pulmonary:     Effort: Pulmonary effort is normal. No respiratory distress.     Comments: Decreased breath sound.  Abdominal:     General: Bowel sounds are normal. There is no distension.     Palpations: Abdomen is soft.  Musculoskeletal:        General: No deformity. Normal range of motion.     Cervical back: Normal range of motion and neck supple.  Skin:    Findings: No erythema or rash.  Neurological:     Mental Status: She is alert and oriented to person, place, and time. Mental status is at baseline.      LABORATORY DATA:  I have reviewed the data as listed    Latest Ref Rng & Units 09/26/2023    8:42 AM 09/13/2023    2:59 PM 07/24/2023    1:54 PM  CBC  WBC 4.0 - 10.5 K/uL 21.5  9.8  11.9   Hemoglobin 12.0 - 15.0 g/dL 87.1  88.3  89.8   Hematocrit 36.0 - 46.0 % 38.2  34.9  31.2   Platelets 150 - 400 K/uL 403  450  533       Latest Ref Rng & Units 09/13/2023    3:00 PM 07/24/2023    1:54 PM 07/12/2023    5:11 AM  CMP  Glucose 70 - 99 mg/dL 878  878  891   BUN 8 - 23 mg/dL 12  10  <5   Creatinine 0.44 - 1.00 mg/dL 9.33  9.30  9.64   Sodium 135 - 145 mmol/L 130  137  136   Potassium 3.5 - 5.1 mmol/L 3.1  3.6  3.6   Chloride 98 - 111 mmol/L 96  103  104   CO2 22 - 32 mmol/L 23  22  21    Calcium  8.9 - 10.3 mg/dL 9.2  8.7  6.6   Total Protein 6.5 - 8.1 g/dL 7.7  6.7    Total Bilirubin 0.0 - 1.2 mg/dL 0.4  0.4    Alkaline Phos 38 - 126 U/L 125  71    AST 15 - 41 U/L 13  16    ALT 0 - 44 U/L 9  11          RADIOGRAPHIC STUDIES: I have personally reviewed the radiological images as listed and agreed with the findings in the report. US  BIOPSY (LIVER) Result Date: 09/26/2023 INDICATION: 63 year old with history of lung, rectal and  breast cancer. Patient has suspicious liver lesions. Plan for ultrasound-guided liver lesion biopsy. EXAM: ULTRASOUND-GUIDED LIVER LESION BIOPSY MEDICATIONS: Moderate sedation ANESTHESIA/SEDATION: Moderate (conscious) sedation was employed during this procedure. A total of Versed  2 mg and Fentanyl  100 mcg was administered intravenously by the radiology nurse. Total intra-service moderate Sedation Time: 14 minutes. The patient's level  of consciousness and vital signs were monitored continuously by radiology nursing throughout the procedure under my direct supervision. FLUOROSCOPY TIME:  None COMPLICATIONS: None immediate. PROCEDURE: Informed written consent was obtained from the patient after a thorough discussion of the procedural risks, benefits and alternatives. All questions were addressed. A timeout was performed prior to the initiation of the procedure. Liver was evaluated ultrasound. Liver lesion was identified and targeted. The right anterior abdomen was prepped with chlorhexidine  and sterile field was created. Skin was anesthetized using 1% lidocaine . Small incision was made. Using ultrasound guidance, 17 gauge coaxial needle was directed into the hepatic lesion near the gallbladder. Total of 3 core biopsies were performed with an 18 gauge core device. Three adequate specimens obtained and placed in formalin. Gel-Foam slurry was injected as the 17 gauge needle was removed. Bandage placed over the puncture site. FINDINGS: Multiple hepatic lesions. Lesion near the gallbladder, probably within segment 4 B, was targeted. This lesion measured 3.3 x 2.8 cm. Biopsy needle confirmed within the lesion. Three adequate specimens obtained. No immediate bleeding or hematoma formation following the core biopsies. IMPRESSION: Successful ultrasound-guided core biopsies from a hepatic lesion. Electronically Signed   By: Juliene Balder M.D.   On: 09/26/2023 11:55

## 2023-10-05 NOTE — Assessment & Plan Note (Addendum)
 Continue Norco 5/325.  4 to 6 hours..  Mild to morphine  15 mg twice daily. Recommend dexamethasone  8 mg daily

## 2023-10-05 NOTE — Telephone Encounter (Signed)
 [facility] (xT & xR) requested on liver bx specimen: [id number] collected on 09/26/23.

## 2023-10-06 ENCOUNTER — Encounter: Payer: Self-pay | Admitting: Oncology

## 2023-10-10 ENCOUNTER — Ambulatory Visit
Admission: RE | Admit: 2023-10-10 | Discharge: 2023-10-10 | Disposition: A | Discharge status: Home / Self Care | Source: Ambulatory Visit | Attending: Oncology | Admitting: Oncology

## 2023-10-10 DIAGNOSIS — C3491 Malignant neoplasm of unspecified part of right bronchus or lung: Secondary | ICD-10-CM | POA: Diagnosis present

## 2023-10-10 DIAGNOSIS — C20 Malignant neoplasm of rectum: Secondary | ICD-10-CM | POA: Diagnosis present

## 2023-10-10 DIAGNOSIS — C50911 Malignant neoplasm of unspecified site of right female breast: Secondary | ICD-10-CM | POA: Diagnosis present

## 2023-10-10 MED ORDER — GADOBUTROL 1 MMOL/ML IV SOLN
4.0000 mL | Freq: Once | INTRAVENOUS | Status: AC | PRN
  Administered 2023-10-10: 4 mL via INTRAVENOUS

## 2023-10-16 ENCOUNTER — Ambulatory Visit: Payer: Self-pay | Admitting: Oncology

## 2023-10-16 DIAGNOSIS — R930 Abnormal findings on diagnostic imaging of skull and head, not elsewhere classified: Secondary | ICD-10-CM

## 2023-10-17 ENCOUNTER — Other Ambulatory Visit: Payer: Self-pay | Admitting: Oncology

## 2023-10-17 ENCOUNTER — Telehealth: Payer: Self-pay | Admitting: Oncology

## 2023-10-17 DIAGNOSIS — C50911 Malignant neoplasm of unspecified site of right female breast: Secondary | ICD-10-CM

## 2023-10-17 MED ORDER — LIDOCAINE-PRILOCAINE 2.5-2.5 % EX CREA: 1.0000 | TOPICAL_CREAM | CUTANEOUS | 5 refills | Status: DC | PRN

## 2023-10-17 NOTE — Progress Notes (Signed)
 DISCONTINUE ON PATHWAY REGIMEN - Breast     Cycles 1 through 4: A cycle is every 21 days:     Pembrolizumab       Paclitaxel       Carboplatin       Filgrastim -xxxx    Cycles 5 through 8: A cycle is every 21 days:     Pembrolizumab       Doxorubicin       Cyclophosphamide       Pegfilgrastim -xxxx   **Always confirm dose/schedule in your pharmacy ordering system**  PRIOR TREATMENT: [id number]: Pembrolizumab  200 mg D1 + Paclitaxel  80 mg/m2 D1, 8, 15 + Carboplatin  AUC=5 D1 q21 Days x 12 Weeks, Followed by Pembrolizumab  200 mg + Doxorubicin  + Cyclophosphamide  q21 Days x 12 Weeks, Followed by Surgery  START OFF PATHWAY REGIMEN - Breast   [id number]:Carboplatin  AUC=2 IV D1,8,15 + Paclitaxel  100 mg/m2 IV D1,8,15 q28 Days:   A cycle is every 28 days:     Paclitaxel       Carboplatin    **Always confirm dose/schedule in your pharmacy ordering system**  Patient Characteristics: Distant Metastases or Locoregional Recurrent Disease - Unresected, M0 or Locally Advanced Unresectable Disease Progressing after Neoadjuvant and Local Therapies, M0, HER2 Negative/Ultralow/Low, ER Negative, Chemotherapy, HER2 Negative/Ultralow, First  Line, PD-L1 Expression Negative/Unknown or Not a Candidate for Immunotherapy, and gBRCA Wildtype Therapeutic Status: Distant Metastases HER2 Status: Negative (-) ER Status: Negative (-) PR Status: Negative (-) Therapy Approach Indicated: Standard Chemotherapy/Endocrine Therapy Line of Therapy: First Line Intent of Therapy: Non-Curative / Palliative Intent, Discussed with Patient

## 2023-10-17 NOTE — Telephone Encounter (Signed)
 Dr Babara recommends pt to see Dr. Lenn ASAP for brain mets. Referral entered

## 2023-10-17 NOTE — Telephone Encounter (Signed)
 Path results were reviewed with patient recommend chemotherapy. She will see me on 8/7 Pain is not well controlled. Recommend Dexamethasone  8mg  daily, increase MS contin  to 15mg  every 8 hours. Continue norco Q6h.  Further titration of pain medication when she sees me in clinic.

## 2023-10-18 ENCOUNTER — Encounter: Payer: Self-pay | Admitting: Oncology

## 2023-10-18 ENCOUNTER — Other Ambulatory Visit: Payer: Self-pay | Admitting: Oncology

## 2023-10-18 ENCOUNTER — Institutional Professional Consult (permissible substitution): Admitting: Radiation Oncology

## 2023-10-18 DIAGNOSIS — C7931 Secondary malignant neoplasm of brain: Secondary | ICD-10-CM | POA: Insufficient documentation

## 2023-10-18 MED FILL — Fosaprepitant Dimeglumine For IV Infusion 150 MG (Base Eq): INTRAVENOUS | Qty: 5 | Status: AC

## 2023-10-18 NOTE — Assessment & Plan Note (Signed)
 Will arrange patient to reestablish care with radiation oncology for further evaluation.

## 2023-10-18 NOTE — Progress Notes (Signed)
 Pharmacist Chemotherapy Monitoring - Initial Assessment    Anticipated start date: 10/19/23   The following has been reviewed per standard work regarding the patient's treatment regimen: The patient's diagnosis, treatment plan and drug doses, and organ/hematologic function Lab orders and baseline tests specific to treatment regimen  The treatment plan start date, drug sequencing, and pre-medications Prior authorization status  Patient's documented medication list, including drug-drug interaction screen and prescriptions for anti-emetics and supportive care specific to the treatment regimen The drug concentrations, fluid compatibility, administration routes, and timing of the medications to be used The patient's access for treatment and lifetime cumulative dose history, if applicable  The patient's medication allergies and previous infusion related reactions, if applicable   Changes made to treatment plan:  pre-medications changed emend to civanti starting cycle 2 and updated C1D1 infusion appointment request time  Follow up needed:  N/A   Maudie FORBES Andreas, PharmD, BCPS Clinical Pharmacist   10/18/2023  2:32 PM

## 2023-10-19 ENCOUNTER — Inpatient Hospital Stay

## 2023-10-19 ENCOUNTER — Ambulatory Visit
Admission: RE | Admit: 2023-10-19 | Discharge: 2023-10-19 | Disposition: A | Discharge status: Home / Self Care | Source: Ambulatory Visit | Attending: Radiation Oncology | Admitting: Radiation Oncology

## 2023-10-19 ENCOUNTER — Inpatient Hospital Stay (HOSPITAL_BASED_OUTPATIENT_CLINIC_OR_DEPARTMENT_OTHER): Admitting: Oncology

## 2023-10-19 ENCOUNTER — Ambulatory Visit

## 2023-10-19 ENCOUNTER — Encounter: Payer: Self-pay | Admitting: Oncology

## 2023-10-19 ENCOUNTER — Inpatient Hospital Stay: Attending: Oncology

## 2023-10-19 VITALS — BP 149/75 | HR 89

## 2023-10-19 VITALS — BP 141/81 | HR 105 | Temp 98.5°F | Resp 18 | Wt 103.3 lb

## 2023-10-19 DIAGNOSIS — Z5111 Encounter for antineoplastic chemotherapy: Secondary | ICD-10-CM

## 2023-10-19 DIAGNOSIS — G620 Drug-induced polyneuropathy: Secondary | ICD-10-CM

## 2023-10-19 DIAGNOSIS — C7951 Secondary malignant neoplasm of bone: Secondary | ICD-10-CM | POA: Insufficient documentation

## 2023-10-19 DIAGNOSIS — C50911 Malignant neoplasm of unspecified site of right female breast: Secondary | ICD-10-CM

## 2023-10-19 DIAGNOSIS — G893 Neoplasm related pain (acute) (chronic): Secondary | ICD-10-CM | POA: Diagnosis not present

## 2023-10-19 DIAGNOSIS — C3491 Malignant neoplasm of unspecified part of right bronchus or lung: Secondary | ICD-10-CM

## 2023-10-19 DIAGNOSIS — C20 Malignant neoplasm of rectum: Secondary | ICD-10-CM | POA: Diagnosis not present

## 2023-10-19 DIAGNOSIS — F17210 Nicotine dependence, cigarettes, uncomplicated: Secondary | ICD-10-CM | POA: Diagnosis not present

## 2023-10-19 DIAGNOSIS — Z720 Tobacco use: Secondary | ICD-10-CM

## 2023-10-19 DIAGNOSIS — T451X5A Adverse effect of antineoplastic and immunosuppressive drugs, initial encounter: Secondary | ICD-10-CM

## 2023-10-19 DIAGNOSIS — C7931 Secondary malignant neoplasm of brain: Secondary | ICD-10-CM | POA: Diagnosis not present

## 2023-10-19 DIAGNOSIS — C50919 Malignant neoplasm of unspecified site of unspecified female breast: Secondary | ICD-10-CM

## 2023-10-19 DIAGNOSIS — K5900 Constipation, unspecified: Secondary | ICD-10-CM | POA: Insufficient documentation

## 2023-10-19 DIAGNOSIS — Z17421 Hormone receptor negative with human epidermal growth factor receptor 2 negative status: Secondary | ICD-10-CM | POA: Insufficient documentation

## 2023-10-19 DIAGNOSIS — K5903 Drug induced constipation: Secondary | ICD-10-CM

## 2023-10-19 LAB — CBC WITH DIFFERENTIAL (CANCER CENTER ONLY)
Abs Immature Granulocytes: 0.27 K/uL — ABNORMAL HIGH (ref 0.00–0.07)
Basophils Absolute: 0 K/uL (ref 0.0–0.1)
Basophils Relative: 0 %
Eosinophils Absolute: 0 K/uL (ref 0.0–0.5)
Eosinophils Relative: 0 %
HCT: 34 % — ABNORMAL LOW (ref 36.0–46.0)
Hemoglobin: 11.3 g/dL — ABNORMAL LOW (ref 12.0–15.0)
Immature Granulocytes: 2 %
Lymphocytes Relative: 15 %
Lymphs Abs: 2.3 K/uL (ref 0.7–4.0)
MCH: 27.2 pg (ref 26.0–34.0)
MCHC: 33.2 g/dL (ref 30.0–36.0)
MCV: 81.7 fL (ref 80.0–100.0)
Monocytes Absolute: 1 K/uL (ref 0.1–1.0)
Monocytes Relative: 6 %
Neutro Abs: 12.3 K/uL — ABNORMAL HIGH (ref 1.7–7.7)
Neutrophils Relative %: 77 %
Platelet Count: 480 K/uL — ABNORMAL HIGH (ref 150–400)
RBC: 4.16 MIL/uL (ref 3.87–5.11)
RDW: 14.4 % (ref 11.5–15.5)
WBC Count: 15.9 K/uL — ABNORMAL HIGH (ref 4.0–10.5)
nRBC: 0 % (ref 0.0–0.2)

## 2023-10-19 LAB — CMP (CANCER CENTER ONLY)
ALT: 13 U/L (ref 0–44)
AST: 22 U/L (ref 15–41)
Albumin: 3.4 g/dL — ABNORMAL LOW (ref 3.5–5.0)
Alkaline Phosphatase: 162 U/L — ABNORMAL HIGH (ref 38–126)
Anion gap: 13 (ref 5–15)
BUN: 16 mg/dL (ref 8–23)
CO2: 20 mmol/L — ABNORMAL LOW (ref 22–32)
Calcium: 9.2 mg/dL (ref 8.9–10.3)
Chloride: 96 mmol/L — ABNORMAL LOW (ref 98–111)
Creatinine: 0.83 mg/dL (ref 0.44–1.00)
GFR, Estimated: >60 mL/min (ref >60)
Glucose, Bld: 117 mg/dL — ABNORMAL HIGH (ref 70–99)
Potassium: 3.5 mmol/L (ref 3.5–5.1)
Sodium: 129 mmol/L — ABNORMAL LOW (ref 135–145)
Total Bilirubin: 0.4 mg/dL (ref 0.0–1.2)
Total Protein: 7.4 g/dL (ref 6.5–8.1)

## 2023-10-19 LAB — OSMOLALITY: Osmolality: 275 mosm/kg (ref 275–295)

## 2023-10-19 LAB — OSMOLALITY, URINE: Osmolality, Ur: 391 mosm/kg (ref 300–900)

## 2023-10-19 LAB — SODIUM, URINE, RANDOM: Sodium, Ur: 29 mmol/L

## 2023-10-19 MED ORDER — SODIUM CHLORIDE 0.9 % IV SOLN
Freq: Once | INTRAVENOUS | Status: AC
  Filled 2023-10-19: qty 250

## 2023-10-19 MED ORDER — FAMOTIDINE IN NACL 20-0.9 MG/50ML-% IV SOLN
20.0000 mg | Freq: Once | INTRAVENOUS | Status: AC
  Administered 2023-10-19: 20 mg via INTRAVENOUS
  Filled 2023-10-19: qty 50

## 2023-10-19 MED ORDER — PROCHLORPERAZINE MALEATE 10 MG PO TABS: 10.0000 mg | ORAL_TABLET | Freq: Four times a day (QID) | ORAL | 0 refills | Status: DC | PRN

## 2023-10-19 MED ORDER — SODIUM CHLORIDE 0.9 % IV SOLN
80.0000 mg/m2 | Freq: Once | INTRAVENOUS | Status: AC
  Administered 2023-10-19: 114 mg via INTRAVENOUS
  Filled 2023-10-19: qty 19

## 2023-10-19 MED ORDER — DEXAMETHASONE 1 MG PO TABS: 2.0000 mg | ORAL_TABLET | Freq: Two times a day (BID) | ORAL | 0 refills | Status: DC

## 2023-10-19 MED ORDER — DEXAMETHASONE SODIUM PHOSPHATE 10 MG/ML IJ SOLN
10.0000 mg | Freq: Once | INTRAMUSCULAR | Status: AC
  Administered 2023-10-19: 10 mg via INTRAVENOUS
  Filled 2023-10-19: qty 1

## 2023-10-19 MED ORDER — SODIUM CHLORIDE 0.9 % IV SOLN
INTRAVENOUS | Status: DC
Start: 2023-10-19 — End: 2023-10-19
  Filled 2023-10-19: qty 250

## 2023-10-19 MED ORDER — PALONOSETRON HCL INJECTION 0.25 MG/5ML
0.2500 mg | Freq: Once | INTRAVENOUS | Status: AC
  Administered 2023-10-19: 0.25 mg via INTRAVENOUS
  Filled 2023-10-19: qty 5

## 2023-10-19 MED ORDER — SODIUM CHLORIDE 0.9 % IV SOLN
390.0000 mg | Freq: Once | INTRAVENOUS | Status: AC
  Administered 2023-10-19: 390 mg via INTRAVENOUS
  Filled 2023-10-19: qty 39

## 2023-10-19 MED ORDER — DIPHENHYDRAMINE HCL 50 MG/ML IJ SOLN
50.0000 mg | Freq: Once | INTRAMUSCULAR | Status: AC
  Administered 2023-10-19: 50 mg via INTRAVENOUS
  Filled 2023-10-19: qty 1

## 2023-10-19 MED ORDER — DOCUSATE SODIUM 100 MG PO CAPS: 100.0000 mg | ORAL_CAPSULE | Freq: Two times a day (BID) | ORAL | 1 refills | Status: DC

## 2023-10-19 MED ORDER — OXYCODONE HCL 5 MG PO TABS: 5.0000 mg | ORAL_TABLET | Freq: Four times a day (QID) | ORAL | 0 refills | Status: DC | PRN

## 2023-10-19 MED ORDER — SODIUM CHLORIDE 0.9 % IV SOLN
150.0000 mg | Freq: Once | INTRAVENOUS | Status: AC
  Administered 2023-10-19: 150 mg via INTRAVENOUS
  Filled 2023-10-19: qty 150

## 2023-10-19 NOTE — Progress Notes (Signed)
 Radiation Oncology Follow up Note   old patient new area brain metastasis  Name: Yvonne Scott   Date:   10/19/2023 MRN:  [id number] DOB: [birth year]    This 64 y.o. female presents to the clinic today for evaluation of brain metastasis and patient status post SBRT to her right upper lobe back in November 2024.  Tumor was a squamous cell carcinoma by biopsy.  Patient also has 3 known carcinomas including right breast cancer triple negative as well as adenocarcinoma the rectum.  REFERRING PROVIDER: Carin Gauze, NP  HPI: Patient is a 63 year old female well-known to our department having been treated back in November 2024 for a stage I right upper lobe non-small cell lung cancer with SBRT.  She has gone on to have.  Developed liver metastasis as well as multiple brain metastasis.  She recently had a liver biopsy with pathology consistent with breast primary with slight ER positivity.  She recent MRI scan showing multiple metastatic disease in the left cerebellum frontal lobe cerebral hemisphere.  She also has a pattern of moderate chronic small vessel ischemic changes.  She is seen today for evaluation of her brain metastasis.  She does have some headaches.  PET scan did show metastatic disease in T11-T12 and L5 she is having some back pain number lesions need immediate attention for my review.  She specifically [place] any focal neurologic deficits any change in her crude visual fields.  She is currently being treated with Pembro doxorubicin  cyclophosphamide  which she appears to be tolerating well.  COMPLICATIONS OF TREATMENT: none  FOLLOW UP COMPLIANCE: keeps appointments   PHYSICAL EXAM:  LMP 11/19/2002 (Approximate)  Cranial nerves II through XII are grossly intact crude visual fields are within normal range proprioception is intact to motor sensory and Diette levels are equal and symmetric in the upper lower extremities.  Well-developed well-nourished patient in NAD. HEENT reveals PERLA,  EOMI, discs not visualized.  Oral cavity is clear. No oral mucosal lesions are identified. Neck is clear without evidence of cervical or supraclavicular adenopathy. Lungs are clear to A&P. Cardiac examination is essentially unremarkable with regular rate and rhythm without murmur rub or thrill. Abdomen is benign with no organomegaly or masses noted. Motor sensory and DTR levels are equal and symmetric in the upper and lower extremities. Cranial nerves II through XII are grossly intact. Proprioception is intact. No peripheral adenopathy or edema is identified. No motor or sensory levels are noted. Crude visual fields are within normal range.  RADIOLOGY RESULTS: MRI scans PET scan all reviewed compatible with above-stated findings  PLAN: Present time elected with whole brain radiation therapy would plan on delivering 30 Gray in 10 fractions.  Risks and benefits of treatment including hair loss skin reaction fatigue alteration blood counts all were described in detail to the patient.  She comprehends my treatment plan well.  I personally 7 ordered CT simulation for next week.  She will continue on her steroids Decadron  of 2 mg twice a day.  Simulation appointment was given.  I would like to take this opportunity to thank you for allowing me to participate in the care of your patient.SABRA Marcey Penton, MD

## 2023-10-19 NOTE — Assessment & Plan Note (Signed)
 reestablish care with radiation oncology for further evaluation. Dexamethasone  2mg  BID

## 2023-10-19 NOTE — Assessment & Plan Note (Signed)
 Bronchoscopy biopsy of right upper lobe nodule showed squamous cell carcinoma. Other lung nodule was not able to be biopsied due to location and extract for pneumothorax. S/p  lung SBRT.   monitor

## 2023-10-19 NOTE — Progress Notes (Signed)
 Hematology/Oncology Progress note [phone] [phone]       CHIEF COMPLAINTS/REASON FOR VISIT:  Follow-up for triple negative right breast cancer, squamous cell lung cancer, rectal Stage 0 adenocarcinoma   ASSESSMENT & PLAN:   Cancer Staging  Invasive ductal carcinoma of right breast (HCC) Staging form: Breast, AJCC 8th Edition - Pathologic stage from 10/19/2021: ypT2, ypN0, cM0, G3, ER-, PR-, HER2- - Signed by Babara Call, MD on 11/03/2021 - Pathologic stage from 10/19/2023: Stage IV (rpM1, ER+, PR-, HER2-) - Signed by Babara Call, MD on 10/19/2023  Rectal adenocarcinoma [facility]) Staging form: Colon and Rectum, AJCC 8th Edition - Clinical stage from 05/15/2023: Stage 0 (cTis, cN0, cM0) - Signed by Babara Call, MD on 05/15/2023  Squamous cell lung cancer, right [facility]) Staging form: Lung, AJCC 8th Edition - Clinical: cT1, cN0, cM0 - Signed by Babara Call, MD on 01/10/2023   Invasive ductal carcinoma of right breast (HCC) Clinical stage III triple negative right breast cancer. cT3 cN0-1 S/p  neoadjuvant carboplatin  Taxol  Keytruda  followed by [facility] with Keytruda  x 3. 4th cycle of AC with keytruda  was not given due to poor tolerance. Status post right mastectomy with sentinel lymph node biopsy. ypT2 ypN0, residual disease, Patient declines any adjuvant chemotherapy or immunotherapy. 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  Liquid NGS showed PTEN mutation.  Will check tissue biopsy NGS.  recommend carboplatin  AUC of 5 and weekly Taxol  for visceral crisis.  Labs are reviewed and discussed with patient. She understands that treatment is with palliative intent Proceed with cycle 1 Carboplatin  Taxol .    Rectal adenocarcinoma (HCC) pTis N0 disease.  Status post lower anterior resection, s/p colostomy reversal.     Squamous cell lung cancer, right (HCC) Bronchoscopy biopsy of right upper lobe nodule showed  squamous cell carcinoma. Other lung nodule was not able to be biopsied due to location and extract for pneumothorax. S/p  lung SBRT.   monitor   Metastasis to brain [facility])  reestablish care with radiation oncology for further evaluation. Dexamethasone  2mg  BID    Neoplasm related pain Norco makes her nausea.  Recommend to switch to oxycodone  5mg  Q4-6 hours PRN.  Continue MS contin   15 mg TID Recommend dexamethasone  2mg  BID  Tobacco abuse Smoke cessation was discussed with patient.   Encounter for antineoplastic chemotherapy Chemotherapy as list above.   Chemotherapy-induced neuropathy (HCC) Grade 1/2, due to previous chemotherapy.  Anticipate worse symptom with Taxane treatment.  Monitor       Orders Placed This Encounter  Procedures   Osmolality    Standing Status:   Future    Number of Occurrences:   1    Expected Date:   10/19/2023    Expiration Date:   10/18/2024   Osmolality, urine    Standing Status:   Future    Number of Occurrences:   1    Expected Date:   10/19/2023    Expiration Date:   01/17/2024   Sodium, urine, random    Standing Status:   Future    Number of Occurrences:   1    Expected Date:   10/19/2023    Expiration Date:   01/17/2024   Follow up in 1 week All questions were answered. The patient knows to call the clinic with any problems, questions or concerns.  Call Babara, MD, PhD [facility] Hematology Oncology 10/19/2023  HISTORY OF PRESENTING ILLNESS:   Yvonne Scott is a  63 y.o.  female presents for follow-up of triple negative breast cancer, lung squamous cancer and rectal cancer.  Oncology History  Invasive ductal carcinoma of right breast (HCC)  11/11/2020 Genetic Testing   Genetic testing done at [facility].  Per note, negative.   02/20/2021 Initial Diagnosis   Invasive ductal carcinoma of right breast (HCC)\  -August 2022, patient was diagnosed with right breast stage IIIb cT3 N0M0, grade 2, ER negative, PR negative HER2 negative  breast cancer.  Patient self palpated breast mass many years ago.  10/21/2020 diagnostic mammogram and ultrasound confirmed presence of mass and a subsequent biopsy revealed right breast triple negative cancer. There was plan for patient to start with neoadjuvant chemotherapy followed by surgery and radiation.  Unfortunately patient never started treatment.    Medi port was placed at [facility].  Established care with me on 02/19/2021    02/24/2021 Imaging   CT chest abdomen pelvis showed large right breast mass, 5.5 x 4.8 cm with small right axillary lymph node with variable degrees of enhancement potentially involved but not specific based on size.  This tracked down to retropectoral nodal stations largest lymph node along the lateral margin of the pectoralis major.  Tiny pulmonary nodules in the chest as discussed.  These are nonspecific but would consider short interval follow-up to assess for any changes.  No evidence of metastatic disease involving the abdomen or pelvis.  Aortic atherosclerosis.   02/25/2021 Echocardiogram   echocardiogram showed LVEF 65 to 70%.  Patient has been to chemotherapy class and understands antiemetics instructions   03/11/2021 - 08/26/2021 Chemotherapy   BREAST Pembrolizumab  (200) D1 + Carboplatin  (5) D1 + Paclitaxel  (80) D1,8,15 q21d X 4 cycles, followed by Pembrolizumab  (200) D1 + AC D1 q21d x 3 cycles. 4th cycle was omitted due to poor tolerance.     03/16/2021 Imaging   bone scan showed no evidence of osseous metastatic breast cancer, asymmetric osseous uptake on the right at L5-S1, corresponding to degenerative disc disease on recent CT.  Asymmetric soft tissue uptake in the right breast   07/13/2021 Echocardiogram   Echocardiogram showed LVEF 60-65%.  Grade 1 diastolic dysfunction.  Refer details to echocardiogram reports.   10/19/2021 Surgery   Patient underwent right modified radical mastectomy  Pathology showed invasive mammary carcinoma, no special type,  multifocal, with focal chondroid stroma.  Clip and biopsy sites identified.  12 lymph nodes negative for malignancy.  2 lymph nodes displaying dense fibrous scarring, compatible with pathological complete response.  Benign nipple/areola.   Grade 3, LVI not identified, DCIS not identified.  All margins negative for invasive carcinoma.   ypT2 ypN0  Comment Due to the presence of dense fibrosis and treatment effect in two of the sampled lymph nodes, pancytokeratin stains were performed on all submitted lymph nodes.  All lymph nodes are negative for residual metastatic carcinoma.  A separate focus of invasive carcinoma, measuring approximately 11 mm in greatest extent, and histologically similar to the primary tumor, is identified within sampling of the fibrous tissue adjacent to the primary tumor.    10/19/2021 Cancer Staging   Staging form: Breast, AJCC 8th Edition - Pathologic stage from 10/19/2021: No Stage Recommended (ypT2, pN0, cM0, G3, ER-, PR-, HER2-) - Signed by Babara Call, MD on 11/03/2021 Stage prefix: Post-therapy Response to neoadjuvant therapy: Partial response Multigene prognostic tests performed: None Histologic grading system: 3 grade system   05/27/2022 Imaging   CT  chest abdomen pelvis with contrast showed 1. Interval right mastectomy with resolution of suspicious right axillary nodes. 2. Enlargement of a 5 mm right lower lobe pulmonary nodule. Development of a 5 mm left upper lobe pulmonary nodule. Cannot exclude early metastatic disease. Consider chest CT follow-up at 3 months. 3. Otherwise, no evidence of metastatic disease in the chest, abdomen, or pelvis. 4. Age advanced coronary artery atherosclerosis. Recommend assessment of coronary risk factors. 5. Aortic atherosclerosis (ICD10-I70.0) and emphysema (ICD10-J43.9). 6. Mild enlargement of the common duct, which is now minimally dilated for age. Correlate with bilirubin levels. If elevated,consider ERCP.   09/09/2022 Imaging    CT chest without contrast showed 1. Interval growth of three solid bilateral pulmonary nodules since 05/25/2022 chest CT, largest 0.9 cm in the anteromedial left upper lobe, highly suspicious for pulmonary metastases. 2. No thoracic adenopathy. 3. Three-vessel coronary atherosclerosis. 4. Aortic Atherosclerosis (ICD10-I70.0) and Emphysema (ICD10-J43.9).     09/29/2022 Imaging   PET scan shows 1. Hypermetabolic bilateral pulmonary nodules, consistent with metastasis. 2. No other evidence of tracer avid metastatic disease, status post right mastectomy. 3. Hypermetabolism with suggestion of soft tissue fullness in the mid rectum, suspicious for polyp or carcinoma. Consider further evaluation with sigmoidoscopy. 4. Incidental findings, including: Aortic atherosclerosis (ICD10-I70.0), coronary artery atherosclerosis and emphysema (ICD10-J43.9).   08/01/2023 Imaging   CT chest abdomen pelvis with contrast  Chest:   1. Interval increase in multiple pulmonary nodules which are suspicious for pulmonary metastatic disease. The largest is in the medial left upper lobe and measures 2.2 cm. 2. Left hilar and mediastinal lymphadenopathy is present with the largest left hilar lymph node measuring 2.1 cm.   Abdomen and pelvis:   1. Progression of liver metastatic disease from July 01, 2023 and most notably from May 25, 2022. The largest liver lesion is estimated at 2.1 cm in largest dimension.   08/11/2023 Imaging   PET scan showed Overall progression of disease.   As seen on the recent CT scan hypermetabolic lymph nodes the left side of the mediastinum, AP window and hilum. Additional smaller focus seen in the upper anterior mediastinum at the level of the manubrium.   Developing multiple hypermetabolic lung nodules including several which are juxtapleural including the dominant lesion medially along the left upper lobe anteriorly.   Development of multiple hypermetabolic liver  lesions. At least 8 lesions are seen.   Focal nodular area of wall thickening along the anterior aspect of the antrum of the stomach with abnormal uptake. In addition there is adjacent mesenteric nodules just caudal to the antrum of the stomach which could represent peritoneal nodules versus abnormal lymph nodes.   Four hypermetabolic bone lesions consistent with osseous metastatic disease. Areas include right iliac bone above the acetabulum as well as along the spine at T11, T12 and L5.   Surgical changes. There is a small subcutaneous nodule at the level of the pelvis in the midline anteriorly with some minimal uptake. Attention on follow-up.     10/19/2023 -  Chemotherapy   Patient is on Treatment Plan : BREAST AC q21d / Carboplatin  D1 + Paclitaxel  D1,8,15 q21d     10/19/2023 Cancer Staging   Staging form: Breast, AJCC 8th Edition - Pathologic stage from 10/19/2023: Stage IV (rpM1, ER+, PR-, HER2-) - Signed by Babara Call, MD on 10/19/2023 Stage prefix: Recurrence Multigene prognostic tests performed: None   Squamous cell lung cancer, right (HCC)  08/12/2022 Initial Diagnosis   Squamous cell lung cancer, right (HCC)  01/10/2023 Cancer Staging   Staging form: Lung, AJCC 8th Edition - Clinical: cT1, cN0, cM0 - Signed by Babara Call, MD on 01/10/2023 Stage prefix: Initial diagnosis   08/01/2023 Imaging   CT chest abdomen pelvis with contrast  Chest:   1. Interval increase in multiple pulmonary nodules which are suspicious for pulmonary metastatic disease. The largest is in the medial left upper lobe and measures 2.2 cm. 2. Left hilar and mediastinal lymphadenopathy is present with the largest left hilar lymph node measuring 2.1 cm.   Abdomen and pelvis:   1. Progression of liver metastatic disease from July 01, 2023 and most notably from May 25, 2022. The largest liver lesion is estimated at 2.1 cm in largest dimension.   08/11/2023 Imaging   PET scan showed Overall  progression of disease.   As seen on the recent CT scan hypermetabolic lymph nodes the left side of the mediastinum, AP window and hilum. Additional smaller focus seen in the upper anterior mediastinum at the level of the manubrium.   Developing multiple hypermetabolic lung nodules including several which are juxtapleural including the dominant lesion medially along the left upper lobe anteriorly.   Development of multiple hypermetabolic liver lesions. At least 8 lesions are seen.   Focal nodular area of wall thickening along the anterior aspect of the antrum of the stomach with abnormal uptake. In addition there is adjacent mesenteric nodules just caudal to the antrum of the stomach which could represent peritoneal nodules versus abnormal lymph nodes.   Four hypermetabolic bone lesions consistent with osseous metastatic disease. Areas include right iliac bone above the acetabulum as well as along the spine at T11, T12 and L5.   Surgical changes. There is a small subcutaneous nodule at the level of the pelvis in the midline anteriorly with some minimal uptake. Attention on follow-up.     Rectal adenocarcinoma (HCC)  10/13/2022 Initial Diagnosis   Rectal adenocarcinoma (HCC)  patient has large polyp and biopsy showed rectal adenocarcinoma-at least intramucosal. She was also recommended to have bronchoscopy ASAP for further evaluation of the lung nodules.  Patient adamantly declined pulmonology workup concurrently with colonoscopy.  She eventually underwent pulmonology workup with bronchoscopy biopsy of right upper lobe nodule and the results showed squamous cell carcinoma.   lingula nodule was not biopsied due to location and threat for pneumothorax.  Denies hematochezia,  black tarry stool or abdominal pain, change of bowel habits.   01/16/2023 Imaging   MRI pelvis without contrast showed 4.1 cm upper/mid rectal mass, as above.   Rectal adenocarcinoma T stage: T2.   Rectal  adenocarcinoma N stage: N0.   Distance from tumor to the internal anal sphincter is 7.0 cm.   04/21/2023 Surgery   Patient underwent distal sigmoid colon and rectum low anterior resection Pathology showed Tubulovillous adenoma with high-grade dysplasia/intramucosal carcinoma  (pTis)  Negative for invasion  Tumor measures 2.6 x 2.3 x 2.1 cm  Margins free of adenomatous change and carcinoma  Eight benign perirectal lymph nodes (0/8, pN0  Final distal margin benign: Negative for dysplasia and carcinoma.    05/15/2023 Cancer Staging   Staging form: Colon and Rectum, AJCC 8th Edition - Clinical stage from 05/15/2023: Stage 0 (cTis, cN0, cM0) - Signed by Babara Call, MD on 05/15/2023 Stage prefix: Initial diagnosis Total positive nodes: 0   07/07/2023 Surgery   diverting loop ileostomy take down   08/01/2023 Imaging   CT chest abdomen pelvis with contrast  Chest:   1. Interval increase in  multiple pulmonary nodules which are suspicious for pulmonary metastatic disease. The largest is in the medial left upper lobe and measures 2.2 cm. 2. Left hilar and mediastinal lymphadenopathy is present with the largest left hilar lymph node measuring 2.1 cm.   Abdomen and pelvis:   1. Progression of liver metastatic disease from July 01, 2023 and most notably from May 25, 2022. The largest liver lesion is estimated at 2.1 cm in largest dimension.   08/11/2023 Imaging   PET scan showed Overall progression of disease.   As seen on the recent CT scan hypermetabolic lymph nodes the left side of the mediastinum, AP window and hilum. Additional smaller focus seen in the upper anterior mediastinum at the level of the manubrium.   Developing multiple hypermetabolic lung nodules including several which are juxtapleural including the dominant lesion medially along the left upper lobe anteriorly.   Development of multiple hypermetabolic liver lesions. At least 8 lesions are seen.   Focal nodular  area of wall thickening along the anterior aspect of the antrum of the stomach with abnormal uptake. In addition there is adjacent mesenteric nodules just caudal to the antrum of the stomach which could represent peritoneal nodules versus abnormal lymph nodes.   Four hypermetabolic bone lesions consistent with osseous metastatic disease. Areas include right iliac bone above the acetabulum as well as along the spine at T11, T12 and L5.   Surgical changes. There is a small subcutaneous nodule at the level of the pelvis in the midline anteriorly with some minimal uptake. Attention on follow-up.      07/16/2021-07/19/2021, patient was admitted for HCAP patient was treated with antibiotics 09/04/2021 - 09/08/2021 Patient was admitted due to pancytopenia, thrush electrolyte abnormality.    S/p diverting loop ileostomy take down  INTERVAL HISTORY Yvonne Scott is a 63 y.o. female who has above history reviewed by me today presents for follow up visit for management of triple negative breast cancer, lung squamous cell carcinoma, stage 0 rectal adenocarcinoma   Patient continues to smoke on some days.   She reports feeling poorly. Today she presents for follow-up, accompanied by her mother. She has poor appetite and has lost weight.  No fever, chills, nausea vomiting diarrhea blood in the stool.   diffuse body pain. She takes pain meds. Norco makes her nauseated, she requests to switch back to oxycodone .  + constipation   Review of Systems  Constitutional:  Positive for appetite change and fatigue. Negative for chills, fever and unexpected weight change.  HENT:   Negative for hearing loss, mouth sores and voice change.   Eyes:  Negative for eye problems.  Respiratory:  Negative for chest tightness and cough.   Cardiovascular:  Negative for chest pain.  Gastrointestinal:  Negative for abdominal distention, abdominal pain, blood in stool, diarrhea and nausea.  Endocrine: Negative for hot  flashes.  Genitourinary:  Negative for difficulty urinating, dysuria and frequency.   Musculoskeletal:  Positive for arthralgias and back pain.  Skin:  Negative for itching and rash.  Neurological:  Positive for numbness. Negative for extremity weakness.  Hematological:  Negative for adenopathy.  Psychiatric/Behavioral:  Negative for confusion. The patient is nervous/anxious.     MEDICAL HISTORY:  Past Medical History:  Diagnosis Date   Anxiety    Aortic atherosclerosis (HCC) 10/24/2016   Breast cancer (HCC)    colon cancer  and rectal   COPD (chronic obstructive pulmonary disease) (HCC)    Depression    Dyspnea  GERD (gastroesophageal reflux disease)    Hypokalemia    Hypomagnesemia    Invasive ductal carcinoma of right breast (HCC)    Metabolic acidosis    Personal history of chemotherapy    Pneumonia    Severe protein-calorie malnutrition (HCC)    Thrombocytopenia (HCC)     SURGICAL HISTORY: Past Surgical History:  Procedure Laterality Date   BREAST BIOPSY Right 2022   BRONCHIAL BIOPSY  12/29/2022   Procedure: BRONCHIAL BIOPSIES;  Surgeon: Isadora Hose, MD;  Location: [facility];  Service: Pulmonary;;   BRONCHIAL NEEDLE ASPIRATION BIOPSY  12/29/2022   Procedure: BRONCHIAL NEEDLE ASPIRATION BIOPSIES;  Surgeon: Isadora Hose, MD;  Location: [facility];  Service: Pulmonary;;   COLONOSCOPY WITH PROPOFOL  N/A 12/07/2022   Procedure: COLONOSCOPY WITH PROPOFOL ;  Surgeon: Unk Corinn Skiff, MD;  Location: [facility] ENDOSCOPY;  Service: Gastroenterology;  Laterality: N/A;   COLOSTOMY TAKEDOWN N/A 07/07/2023   Procedure: TAKEDOWN OF LOOP ILEOSTOMY;  Surgeon: Teresa Lonni HERO, MD;  Location: [facility];  Service: General;  Laterality: N/A;  TAKEDOWN OF LOOP ILESTOMY   FLEXIBLE SIGMOIDOSCOPY N/A 04/21/2023   Procedure: FLEXIBLE SIGMOIDOSCOPY, ICG PERFUSION;  Surgeon: Teresa Lonni HERO, MD;  Location: [facility];  Service: General;  Laterality: N/A;   FLEXIBLE SIGMOIDOSCOPY N/A  07/07/2023   Procedure: KINGSTON SIDE;  Surgeon: Teresa Lonni HERO, MD;  Location: [facility];  Service: General;  Laterality: N/A;  DIAGNOSTIC FLEXIBLE SIGMOIDOSCOPY   FOOT SURGERY Left    little toe correction   MASTECTOMY W/ SENTINEL NODE BIOPSY Right 10/19/2021   Procedure: MASTECTOMY WITH SENTINEL LYMPH NODE BIOPSY ( modified radical);  Surgeon: Rodolph Romano, MD;  Location: [facility] ORS;  Service: General;  Laterality: Right;   POLYPECTOMY  12/07/2022   Procedure: POLYPECTOMY;  Surgeon: Unk Corinn Skiff, MD;  Location: [facility] ENDOSCOPY;  Service: Gastroenterology;;   PORTA CATH INSERTION  01/19/2021   SUBMUCOSAL TATTOO INJECTION  12/07/2022   Procedure: SUBMUCOSAL TATTOO INJECTION;  Surgeon: Unk Corinn Skiff, MD;  Location: [facility] ENDOSCOPY;  Service: Gastroenterology;;   VIDEO BRONCHOSCOPY WITH ENDOBRONCHIAL ULTRASOUND N/A 12/29/2022   Procedure: VIDEO BRONCHOSCOPY WITH ENDOBRONCHIAL ULTRASOUND;  Surgeon: Isadora Hose, MD;  Location: [facility];  Service: Pulmonary;  Laterality: N/A;   XI ROBOTIC ASSISTED LOWER ANTERIOR RESECTION N/A 04/21/2023   Procedure: XI ROBOTIC ASSISTED LOWER ANTERIOR RESECTION, ILEOSTOMY CREATION;  Surgeon: Teresa Lonni HERO, MD;  Location: [facility];  Service: General;  Laterality: N/A;    SOCIAL HISTORY: Social History   Socioeconomic History   Marital status: Widowed    Spouse name: Not on file   Number of children: 1   Years of education: Not on file   Highest education level: Not on file  Occupational History   Not on file  Tobacco Use   Smoking status: Every Day    Types: Cigarettes   Smokeless tobacco: Never   Tobacco comments:    3-4 cigarettes a day  Vaping Use   Vaping status: Never Used  Substance and Sexual Activity   Alcohol use: Never    Alcohol/week: 3.0 standard drinks of alcohol    Types: 3 Standard drinks or equivalent per week   Drug use: Never   Sexual activity: Not Currently    Birth control/protection:  None  Other Topics Concern   Not on file  Social History Narrative   Lives with mother   Social Drivers of Health   Financial Resource Strain: Low Risk  (01/06/2022)   Overall Financial Resource Strain (CARDIA)    Difficulty of  Paying Living Expenses: Not very hard  Food Insecurity: No Food Insecurity (07/09/2023)   Hunger Vital Sign    Worried About Running Out of Food in the Last Year: Never true    Ran Out of Food in the Last Year: Never true  Transportation Needs: No Transportation Needs (07/09/2023)   PRAPARE - Administrator, Civil Service (Medical): No    Lack of Transportation (Non-Medical): No  Physical Activity: Inactive (01/06/2022)   Exercise Vital Sign    Days of Exercise per Week: 0 days    Minutes of Exercise per Session: 0 min  Stress: Stress Concern Present (01/06/2022)   [employer] of Occupational Health - Occupational Stress Questionnaire    Feeling of Stress : Rather much  Social Connections: Moderately Isolated (07/01/2023)   Social Connection and Isolation Panel    Frequency of Communication with Friends and Family: Three times a week    Frequency of Social Gatherings with Friends and Family: Three times a week    Attends Religious Services: 1 to 4 times per year    Active Member of Clubs or Organizations: No    Attends Banker Meetings: Never    Marital Status: Widowed  Intimate Partner Violence: Not At Risk (07/09/2023)   Humiliation, Afraid, Rape, and Kick questionnaire    Fear of Current or Ex-Partner: No    Emotionally Abused: No    Physically Abused: No    Sexually Abused: No    FAMILY HISTORY: Family History  Problem Relation Age of Onset   Melanoma Father 1   Cirrhosis Maternal Grandfather    Breast cancer Paternal Grandmother     ALLERGIES:  has no known allergies.  MEDICATIONS:  Current Outpatient Medications  Medication Sig Dispense Refill   budesonide -formoterol  (SYMBICORT ) 160-4.5 MCG/ACT inhaler  Inhale 2 puffs into the lungs 2 (two) times daily. 1 each 12   docusate sodium  (COLACE) 100 MG capsule Take 1 capsule (100 mg total) by mouth 2 (two) times daily. 60 capsule 1   fluticasone  (FLONASE ) 50 MCG/ACT nasal spray Place 1 spray into both nostrils daily as needed for rhinitis or allergies.     ibuprofen  (ADVIL ) 200 MG tablet Take 200-400 mg by mouth every 6 (six) hours as needed for headache.     lidocaine -prilocaine  (EMLA ) cream Apply 1 Application topically as needed. 30 g 5   magnesium  oxide (MAG-OX) 400 (240 Mg) MG tablet Take 800 mg by mouth daily.     morphine  (MS CONTIN ) 15 MG 12 hr tablet Take 1 tablet (15 mg total) by mouth every 12 (twelve) hours. 30 tablet 0   omeprazole  (PRILOSEC) 40 MG capsule Take 1 capsule (40 mg total) by mouth daily. 90 capsule 0   ondansetron  (ZOFRAN ) 8 MG tablet Take 1 tablet (8 mg total) by mouth every 8 (eight) hours as needed for nausea or vomiting. 60 tablet 0   oxyCODONE  (OXY IR/ROXICODONE ) 5 MG immediate release tablet Take 1 tablet (5 mg total) by mouth every 6 (six) hours as needed for severe pain (pain score 7-10) or breakthrough pain. 60 tablet 0   potassium chloride  SA (KLOR-CON  M) 20 MEQ tablet Take 1 tablet (20 mEq total) by mouth daily. 3 tablet 0   prochlorperazine  (COMPAZINE ) 10 MG tablet Take 1 tablet (10 mg total) by mouth every 6 (six) hours as needed for nausea or vomiting. 60 tablet 0   VENTOLIN  HFA 108 (90 Base) MCG/ACT inhaler INHALE 1 PUFF BY MOUTH EVERY 6 HOURS AS  NEEDED FOR SHORTNESS OF BREATH FOR WHEEZING (Patient taking differently: Inhale 1-2 puffs into the lungs every 6 (six) hours as needed for shortness of breath or wheezing.) 18 g 0   dexamethasone  (DECADRON ) 1 MG tablet Take 2 tablets (2 mg total) by mouth 2 (two) times daily with a meal. 60 tablet 0   naloxone (NARCAN) nasal spray 4 mg/0.1 mL Place 0.4 mg into the nose once as needed (as directed). (Patient not taking: Reported on 10/19/2023)     No current  facility-administered medications for this visit.   Facility-Administered Medications Ordered in Other Visits  Medication Dose Route Frequency Provider Last Rate Last Admin   0.9 %  sodium chloride  infusion   Intravenous Continuous Babara Call, MD   Stopped at 10/19/23 1308   famotidine  (PEPCID ) 20-0.9 MG/50ML-% IVPB              PHYSICAL EXAMINATION: ECOG PERFORMANCE STATUS: 1 - Symptomatic but completely ambulatory Vitals:   10/19/23 0853  BP: (!) 141/81  Pulse: (!) 105  Resp: 18  Temp: 98.5 F (36.9 C)  SpO2: 100%   Filed Weights   10/19/23 0853  Weight: 103 lb 4.8 oz (46.9 kg)    Physical Exam Constitutional:      General: She is not in acute distress. HENT:     Head: Normocephalic and atraumatic.  Eyes:     General: No scleral icterus. Cardiovascular:     Rate and Rhythm: Normal rate.  Pulmonary:     Effort: Pulmonary effort is normal. No respiratory distress.     Comments: Decreased breath sound.  Abdominal:     General: Bowel sounds are normal. There is no distension.     Palpations: Abdomen is soft.  Musculoskeletal:        General: No deformity. Normal range of motion.     Cervical back: Normal range of motion and neck supple.  Skin:    Findings: No erythema or rash.  Neurological:     Mental Status: She is alert and oriented to person, place, and time. Mental status is at baseline.      LABORATORY DATA:  I have reviewed the data as listed    Latest Ref Rng & Units 10/19/2023    8:41 AM 09/26/2023    8:42 AM 09/13/2023    2:59 PM  CBC  WBC 4.0 - 10.5 K/uL 15.9  21.5  9.8   Hemoglobin 12.0 - 15.0 g/dL 88.6  87.1  88.3   Hematocrit 36.0 - 46.0 % 34.0  38.2  34.9   Platelets 150 - 400 K/uL 480  403  450       Latest Ref Rng & Units 10/19/2023    8:41 AM 09/13/2023    3:00 PM 07/24/2023    1:54 PM  CMP  Glucose 70 - 99 mg/dL 882  878  878   BUN 8 - 23 mg/dL 16  12  10    Creatinine 0.44 - 1.00 mg/dL 9.16  9.33  9.30   Sodium 135 - 145 mmol/L 129  130   137   Potassium 3.5 - 5.1 mmol/L 3.5  3.1  3.6   Chloride 98 - 111 mmol/L 96  96  103   CO2 22 - 32 mmol/L 20  23  22    Calcium  8.9 - 10.3 mg/dL 9.2  9.2  8.7   Total Protein 6.5 - 8.1 g/dL 7.4  7.7  6.7   Total Bilirubin 0.0 - 1.2 mg/dL 0.4  0.4  0.4   Alkaline Phos 38 - 126 U/L 162  125  71   AST 15 - 41 U/L 22  13  16    ALT 0 - 44 U/L 13  9  11          RADIOGRAPHIC STUDIES: I have personally reviewed the radiological images as listed and agreed with the findings in the report. MR Brain W Wo Contrast Result Date: 10/16/2023 CLINICAL DATA:  History of breast cancer. Suspicion of metastatic disease. History of right parietal region meningioma. EXAM: MRI HEAD WITHOUT AND WITH CONTRAST TECHNIQUE: Multiplanar, multiecho pulse sequences of the brain and surrounding structures were obtained without and with intravenous contrast. CONTRAST:  4mL GADAVIST  GADOBUTROL  1 MMOL/ML IV SOLN COMPARISON:  PET scan 08/11/2023.  Brain MRI 02/24/2020. FINDINGS: Brain: No diffusion imaging was performed. There is a 3 mm metastasis within the left pons. Second 3 mm metastasis in the right side of the pons. Centrally necrotic 7 mm metastasis in the left cerebellum without edema or mass effect. Suspicion of a second 3-4 mm metastasis in the inferior left cerebellum, though this could possibly be a venous angioma. 4 mm metastasis in the right frontal lobe axial image 106. Redemonstration of a chronic benign meningioma at the left posterior parietal vertex measuring 8 mm in size without mass-effect upon the brain. 3 mm metastasis in the left frontal lobe adjacent to the frontal horn of the lateral ventricle, axial image 92. Questionable second 3 mm metastasis in a deep sulcus of the left posterior frontal lobe, axial image 105. 3 mm metastasis at the left parieto-occipital junction, axial image 99. There is a background pattern of moderate chronic small-vessel ischemic change of the hemispheric white matter. Because these  metastatic lesions are somewhat low in conspicuity, consider a 3 tesla exam for better characterization, particularly if brain irradiation is being considered. No hydrocephalus.  No extra-axial collection. Vascular: Major vessels at the base of the brain show flow. Skull and upper cervical spine: No calvarial or skull base metastasis is identified. Sinuses/Orbits: Clear/normal Other: None IMPRESSION: 1. Two small metastases in the pons. 1 or 2 metastases in the left cerebellum. One metastasis in the right frontal lobe. Three metastases in the left cerebral hemisphere. These are small lesions, the largest being in the left cerebellum with central necrosis. None of the lesions show mass effect or vasogenic edema. See impression 4. 2. Background pattern of moderate chronic small-vessel ischemic change of the hemispheric white matter. 3. Stable 8 mm meningioma at the left posterior parietal vertex without mass-effect upon the brain. 4. Because these metastatic lesions are somewhat low in conspicuity, consider a 3 tesla exam for better characterization, particularly if stereotactic brain irradiation is being considered. Electronically Signed   By: Oneil Officer M.D.   On: 10/16/2023 16:38   US  BIOPSY (LIVER) Result Date: 09/26/2023 INDICATION: 63 year old with history of lung, rectal and breast cancer. Patient has suspicious liver lesions. Plan for ultrasound-guided liver lesion biopsy. EXAM: ULTRASOUND-GUIDED LIVER LESION BIOPSY MEDICATIONS: Moderate sedation ANESTHESIA/SEDATION: Moderate (conscious) sedation was employed during this procedure. A total of Versed  2 mg and Fentanyl  100 mcg was administered intravenously by the radiology nurse. Total intra-service moderate Sedation Time: 14 minutes. The patient's level of consciousness and vital signs were monitored continuously by radiology nursing throughout the procedure under my direct supervision. FLUOROSCOPY TIME:  None COMPLICATIONS: None immediate. PROCEDURE:  Informed written consent was obtained from the patient after a thorough discussion of the procedural risks, benefits and alternatives. All questions  were addressed. A timeout was performed prior to the initiation of the procedure. Liver was evaluated ultrasound. Liver lesion was identified and targeted. The right anterior abdomen was prepped with chlorhexidine  and sterile field was created. Skin was anesthetized using 1% lidocaine . Small incision was made. Using ultrasound guidance, 17 gauge coaxial needle was directed into the hepatic lesion near the gallbladder. Total of 3 core biopsies were performed with an 18 gauge core device. Three adequate specimens obtained and placed in formalin. Gel-Foam slurry was injected as the 17 gauge needle was removed. Bandage placed over the puncture site. FINDINGS: Multiple hepatic lesions. Lesion near the gallbladder, probably within segment 4 B, was targeted. This lesion measured 3.3 x 2.8 cm. Biopsy needle confirmed within the lesion. Three adequate specimens obtained. No immediate bleeding or hematoma formation following the core biopsies. IMPRESSION: Successful ultrasound-guided core biopsies from a hepatic lesion. Electronically Signed   By: Juliene Balder M.D.   On: 09/26/2023 11:55

## 2023-10-19 NOTE — Assessment & Plan Note (Signed)
 Grade 1/2, due to previous chemotherapy.  Anticipate worse symptom with Taxane treatment.  Monitor

## 2023-10-19 NOTE — Patient Instructions (Signed)

## 2023-10-19 NOTE — Assessment & Plan Note (Signed)
 Recommend stool softner colace 100mg  1-2 time daily + miralax  17g daily PRN

## 2023-10-19 NOTE — Assessment & Plan Note (Signed)
 Norco makes her nausea.  Recommend to switch to oxycodone  5mg  Q4-6 hours PRN.  Continue MS contin   15 mg TID Recommend dexamethasone  2mg  BID

## 2023-10-19 NOTE — Patient Instructions (Signed)
 [facility] - A DEPT OF [facility]  Discharge Instructions: Thank you for choosing [facility] to provide your oncology and hematology care.  If you have a lab appointment with the Cancer Center, please go directly to the Cancer Center and check in at the registration area.  Wear comfortable clothing and clothing appropriate for easy access to any Portacath or PICC line.   We strive to give you quality time with your provider. You may need to reschedule your appointment if you arrive late (15 or more minutes).  Arriving late affects you and other patients whose appointments are after yours.  Also, if you miss three or more appointments without notifying the office, you may be dismissed from the clinic at the provider's discretion.      For prescription refill requests, have your pharmacy contact our office and allow 72 hours for refills to be completed.    Today you received the following chemotherapy and/or immunotherapy agents- taxol, carboplatin      To help prevent nausea and vomiting after your treatment, we encourage you to take your nausea medication as directed.  BELOW ARE SYMPTOMS THAT SHOULD BE REPORTED IMMEDIATELY: *FEVER GREATER THAN 100.4 F (38 C) OR HIGHER *CHILLS OR SWEATING *NAUSEA AND VOMITING THAT IS NOT CONTROLLED WITH YOUR NAUSEA MEDICATION *UNUSUAL SHORTNESS OF BREATH *UNUSUAL BRUISING OR BLEEDING *URINARY PROBLEMS (pain or burning when urinating, or frequent urination) *BOWEL PROBLEMS (unusual diarrhea, constipation, pain near the anus) TENDERNESS IN MOUTH AND THROAT WITH OR WITHOUT PRESENCE OF ULCERS (sore throat, sores in mouth, or a toothache) UNUSUAL RASH, SWELLING OR PAIN  UNUSUAL VAGINAL DISCHARGE OR ITCHING   Items with * indicate a potential emergency and should be followed up as soon as possible or go to the Emergency Department if any problems should occur.  Please show the CHEMOTHERAPY ALERT CARD or  IMMUNOTHERAPY ALERT CARD at check-in to the Emergency Department and triage nurse.  Should you have questions after your visit or need to cancel or reschedule your appointment, please contact [facility] - A DEPT OF [facility]  [phone] and follow the prompts.  Office hours are 8:00 a.m. to 4:30 p.m. Monday - Friday. Please note that voicemails left after 4:00 p.m. may not be returned until the following business day.  We are closed weekends and major holidays. You have access to a nurse at all times for urgent questions. Please call the main number to the clinic [phone] and follow the prompts.  For any non-urgent questions, you may also contact your provider using MyChart. We now offer e-Visits for anyone 36 and older to request care online for non-urgent symptoms. For details visit [email or web address].   Also download the MyChart app! Go to the app store, search "MyChart", open the app, select [place], and log in with your MyChart username and password.

## 2023-10-19 NOTE — Assessment & Plan Note (Signed)
Smoke cessation was discussed with patient.

## 2023-10-19 NOTE — Assessment & Plan Note (Signed)
 Chemotherapy as list above.

## 2023-10-19 NOTE — Assessment & Plan Note (Addendum)
 Clinical stage III triple negative right breast cancer. cT3 cN0-1 S/p  neoadjuvant carboplatin  Taxol  Keytruda  followed by [facility] with Keytruda  x 3. 4th cycle of AC with keytruda  was not given due to poor tolerance. Status post right mastectomy with sentinel lymph node biopsy. ypT2 ypN0, residual disease, Patient declines any adjuvant chemotherapy or immunotherapy. 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  Liquid NGS showed PTEN mutation.  Will check tissue biopsy NGS.  recommend carboplatin  AUC of 5 and weekly Taxol  for visceral crisis.  Labs are reviewed and discussed with patient. She understands that treatment is with palliative intent Proceed with cycle 1 Carboplatin  Taxol .

## 2023-10-19 NOTE — Assessment & Plan Note (Signed)
 pTis N0 disease.  Status post lower anterior resection, s/p colostomy reversal.

## 2023-10-20 ENCOUNTER — Telehealth: Payer: Self-pay | Admitting: *Deleted

## 2023-10-20 LAB — CANCER ANTIGEN 27.29: CA 27.29: 414.2 U/mL — ABNORMAL HIGH (ref 0.0–38.6)

## 2023-10-20 LAB — CANCER ANTIGEN 15-3: CA 15-3: 318 U/mL — ABNORMAL HIGH (ref 0.0–25.0)

## 2023-10-20 NOTE — Telephone Encounter (Signed)
 The patient called and had questions and so she asked can you have your nails done and the answer is yes. Then she asked about lotions and I said yes you can have lotions but they cannot have scents in it.  The radiation she cannot have any kind of lotion or creams around the part of the body that is getting radiated.  She is okay with all this information

## 2023-10-25 ENCOUNTER — Observation Stay

## 2023-10-25 ENCOUNTER — Other Ambulatory Visit: Payer: Self-pay

## 2023-10-25 ENCOUNTER — Ambulatory Visit

## 2023-10-25 ENCOUNTER — Telehealth: Payer: Self-pay | Admitting: *Deleted

## 2023-10-25 ENCOUNTER — Emergency Department

## 2023-10-25 ENCOUNTER — Inpatient Hospital Stay
Admission: EM | Admit: 2023-10-25 | Discharge: 2023-11-13 | DRG: 181 | Disposition: E | Attending: Internal Medicine | Admitting: Internal Medicine

## 2023-10-25 ENCOUNTER — Encounter: Payer: Self-pay | Admitting: Emergency Medicine

## 2023-10-25 DIAGNOSIS — R531 Weakness: Secondary | ICD-10-CM | POA: Diagnosis not present

## 2023-10-25 DIAGNOSIS — K5900 Constipation, unspecified: Secondary | ICD-10-CM | POA: Diagnosis present

## 2023-10-25 DIAGNOSIS — C787 Secondary malignant neoplasm of liver and intrahepatic bile duct: Secondary | ICD-10-CM | POA: Diagnosis present

## 2023-10-25 DIAGNOSIS — Z9011 Acquired absence of right breast and nipple: Secondary | ICD-10-CM

## 2023-10-25 DIAGNOSIS — K219 Gastro-esophageal reflux disease without esophagitis: Secondary | ICD-10-CM | POA: Diagnosis present

## 2023-10-25 DIAGNOSIS — Z7189 Other specified counseling: Secondary | ICD-10-CM

## 2023-10-25 DIAGNOSIS — G893 Neoplasm related pain (acute) (chronic): Secondary | ICD-10-CM | POA: Diagnosis present

## 2023-10-25 DIAGNOSIS — Z7951 Long term (current) use of inhaled steroids: Secondary | ICD-10-CM

## 2023-10-25 DIAGNOSIS — Z79899 Other long term (current) drug therapy: Secondary | ICD-10-CM

## 2023-10-25 DIAGNOSIS — Z17421 Hormone receptor negative with human epidermal growth factor receptor 2 negative status: Secondary | ICD-10-CM

## 2023-10-25 DIAGNOSIS — R109 Unspecified abdominal pain: Secondary | ICD-10-CM | POA: Diagnosis present

## 2023-10-25 DIAGNOSIS — Z638 Other specified problems related to primary support group: Secondary | ICD-10-CM

## 2023-10-25 DIAGNOSIS — Z9221 Personal history of antineoplastic chemotherapy: Secondary | ICD-10-CM

## 2023-10-25 DIAGNOSIS — R000 Tachycardia, unspecified: Secondary | ICD-10-CM | POA: Diagnosis present

## 2023-10-25 DIAGNOSIS — F17210 Nicotine dependence, cigarettes, uncomplicated: Secondary | ICD-10-CM | POA: Diagnosis present

## 2023-10-25 DIAGNOSIS — W19XXXA Unspecified fall, initial encounter: Secondary | ICD-10-CM | POA: Diagnosis present

## 2023-10-25 DIAGNOSIS — Z79891 Long term (current) use of opiate analgesic: Secondary | ICD-10-CM

## 2023-10-25 DIAGNOSIS — Z720 Tobacco use: Secondary | ICD-10-CM | POA: Diagnosis present

## 2023-10-25 DIAGNOSIS — C50911 Malignant neoplasm of unspecified site of right female breast: Secondary | ICD-10-CM | POA: Diagnosis present

## 2023-10-25 DIAGNOSIS — C20 Malignant neoplasm of rectum: Secondary | ICD-10-CM | POA: Diagnosis present

## 2023-10-25 DIAGNOSIS — Z66 Do not resuscitate: Secondary | ICD-10-CM | POA: Diagnosis present

## 2023-10-25 DIAGNOSIS — Z515 Encounter for palliative care: Secondary | ICD-10-CM

## 2023-10-25 DIAGNOSIS — C3491 Malignant neoplasm of unspecified part of right bronchus or lung: Principal | ICD-10-CM | POA: Diagnosis present

## 2023-10-25 DIAGNOSIS — Z860100 Personal history of colon polyps, unspecified: Secondary | ICD-10-CM

## 2023-10-25 DIAGNOSIS — I700 Atherosclerosis of aorta: Secondary | ICD-10-CM | POA: Diagnosis present

## 2023-10-25 DIAGNOSIS — I959 Hypotension, unspecified: Secondary | ICD-10-CM | POA: Diagnosis present

## 2023-10-25 DIAGNOSIS — K5903 Drug induced constipation: Secondary | ICD-10-CM

## 2023-10-25 DIAGNOSIS — C7951 Secondary malignant neoplasm of bone: Secondary | ICD-10-CM | POA: Diagnosis present

## 2023-10-25 DIAGNOSIS — Z8379 Family history of other diseases of the digestive system: Secondary | ICD-10-CM

## 2023-10-25 DIAGNOSIS — J449 Chronic obstructive pulmonary disease, unspecified: Secondary | ICD-10-CM | POA: Diagnosis present

## 2023-10-25 DIAGNOSIS — Z803 Family history of malignant neoplasm of breast: Secondary | ICD-10-CM

## 2023-10-25 DIAGNOSIS — Z808 Family history of malignant neoplasm of other organs or systems: Secondary | ICD-10-CM

## 2023-10-25 DIAGNOSIS — C3412 Malignant neoplasm of upper lobe, left bronchus or lung: Secondary | ICD-10-CM | POA: Diagnosis present

## 2023-10-25 DIAGNOSIS — C7931 Secondary malignant neoplasm of brain: Secondary | ICD-10-CM | POA: Diagnosis present

## 2023-10-25 DIAGNOSIS — E876 Hypokalemia: Secondary | ICD-10-CM | POA: Diagnosis not present

## 2023-10-25 DIAGNOSIS — Z923 Personal history of irradiation: Secondary | ICD-10-CM

## 2023-10-25 DIAGNOSIS — R54 Age-related physical debility: Secondary | ICD-10-CM | POA: Diagnosis present

## 2023-10-25 LAB — URINALYSIS, ROUTINE W REFLEX MICROSCOPIC
Bacteria, UA: NONE SEEN
Bilirubin Urine: NEGATIVE
Glucose, UA: NEGATIVE mg/dL
Ketones, ur: NEGATIVE mg/dL
Nitrite: NEGATIVE
Protein, ur: 30 mg/dL — AB
Specific Gravity, Urine: 1.014 (ref 1.005–1.030)
WBC, UA: >50 WBC/hpf (ref 0–5)
pH: 6 (ref 5.0–8.0)

## 2023-10-25 LAB — COMPREHENSIVE METABOLIC PANEL WITH GFR
ALT: 64 U/L — ABNORMAL HIGH (ref 0–44)
AST: 112 U/L — ABNORMAL HIGH (ref 15–41)
Albumin: 3.4 g/dL — ABNORMAL LOW (ref 3.5–5.0)
Alkaline Phosphatase: 350 U/L — ABNORMAL HIGH (ref 38–126)
Anion gap: 15 (ref 5–15)
BUN: 25 mg/dL — ABNORMAL HIGH (ref 8–23)
CO2: 23 mmol/L (ref 22–32)
Calcium: 9.2 mg/dL (ref 8.9–10.3)
Chloride: 95 mmol/L — ABNORMAL LOW (ref 98–111)
Creatinine, Ser: 0.64 mg/dL (ref 0.44–1.00)
GFR, Estimated: >60 mL/min (ref >60)
Glucose, Bld: 104 mg/dL — ABNORMAL HIGH (ref 70–99)
Potassium: 2.5 mmol/L — CL (ref 3.5–5.1)
Sodium: 133 mmol/L — ABNORMAL LOW (ref 135–145)
Total Bilirubin: 0.8 mg/dL (ref 0.0–1.2)
Total Protein: 7.5 g/dL (ref 6.5–8.1)

## 2023-10-25 LAB — CBC WITH DIFFERENTIAL/PLATELET
Abs Immature Granulocytes: 0.13 K/uL — ABNORMAL HIGH (ref 0.00–0.07)
Basophils Absolute: 0 K/uL (ref 0.0–0.1)
Basophils Relative: 1 %
Eosinophils Absolute: 0 K/uL (ref 0.0–0.5)
Eosinophils Relative: 1 %
HCT: 39.1 % (ref 36.0–46.0)
Hemoglobin: 12.6 g/dL (ref 12.0–15.0)
Immature Granulocytes: 2 %
Lymphocytes Relative: 16 %
Lymphs Abs: 0.9 K/uL (ref 0.7–4.0)
MCH: 26 pg (ref 26.0–34.0)
MCHC: 32.2 g/dL (ref 30.0–36.0)
MCV: 80.8 fL (ref 80.0–100.0)
Monocytes Absolute: 0.1 K/uL (ref 0.1–1.0)
Monocytes Relative: 2 %
Neutro Abs: 4.6 K/uL (ref 1.7–7.7)
Neutrophils Relative %: 78 %
Platelets: 249 K/uL (ref 150–400)
RBC: 4.84 MIL/uL (ref 3.87–5.11)
RDW: 14.6 % (ref 11.5–15.5)
WBC: 5.9 K/uL (ref 4.0–10.5)
nRBC: 0 % (ref 0.0–0.2)

## 2023-10-25 LAB — HIV ANTIBODY (ROUTINE TESTING W REFLEX): HIV Screen 4th Generation wRfx: NONREACTIVE

## 2023-10-25 LAB — MAGNESIUM: Magnesium: 2 mg/dL (ref 1.7–2.4)

## 2023-10-25 LAB — LIPASE, BLOOD: Lipase: 28 U/L (ref 11–51)

## 2023-10-25 LAB — PHOSPHORUS: Phosphorus: 2.8 mg/dL (ref 2.5–4.6)

## 2023-10-25 MED ORDER — MAGNESIUM OXIDE -MG SUPPLEMENT 400 (240 MG) MG PO TABS
800.0000 mg | ORAL_TABLET | Freq: Every day | ORAL | Status: DC
  Administered 2023-10-25 – 2023-10-27 (×4): 800 mg via ORAL
  Filled 2023-10-25 (×3): qty 2

## 2023-10-25 MED ORDER — SENNOSIDES-DOCUSATE SODIUM 8.6-50 MG PO TABS
2.0000 | ORAL_TABLET | Freq: Every day | ORAL | Status: DC
  Administered 2023-10-25 – 2023-10-27 (×3): 2 via ORAL
  Filled 2023-10-25 (×3): qty 2

## 2023-10-25 MED ORDER — POTASSIUM CHLORIDE 20 MEQ PO PACK
20.0000 meq | PACK | Freq: Once | ORAL | Status: AC
  Administered 2023-10-25 (×2): 20 meq via ORAL
  Filled 2023-10-25: qty 1

## 2023-10-25 MED ORDER — OXYCODONE HCL 5 MG PO TABS
5.0000 mg | ORAL_TABLET | Freq: Once | ORAL | Status: AC
  Administered 2023-10-25 (×2): 5 mg via ORAL
  Filled 2023-10-25: qty 1

## 2023-10-25 MED ORDER — SODIUM CHLORIDE 0.9 % IV BOLUS
1000.0000 mL | Freq: Once | INTRAVENOUS | Status: AC
  Administered 2023-10-25 (×2): 1000 mL via INTRAVENOUS

## 2023-10-25 MED ORDER — ALBUTEROL SULFATE HFA 108 (90 BASE) MCG/ACT IN AERS: 1.0000 | INHALATION_SPRAY | Freq: Four times a day (QID) | RESPIRATORY_TRACT | Status: DC | PRN

## 2023-10-25 MED ORDER — PROCHLORPERAZINE MALEATE 10 MG PO TABS: 10.0000 mg | ORAL_TABLET | Freq: Four times a day (QID) | ORAL | Status: DC | PRN

## 2023-10-25 MED ORDER — NALOXONE HCL 4 MG/0.1ML NA LIQD: 0.4000 mg | Freq: Once | NASAL | Status: DC | PRN

## 2023-10-25 MED ORDER — ACETAMINOPHEN 650 MG RE SUPP
650.0000 mg | Freq: Four times a day (QID) | RECTAL | Status: DC | PRN
Start: 2023-10-25 — End: 2023-10-28

## 2023-10-25 MED ORDER — DOCUSATE SODIUM 100 MG PO CAPS: 100.0000 mg | ORAL_CAPSULE | Freq: Two times a day (BID) | ORAL | Status: DC

## 2023-10-25 MED ORDER — FLUTICASONE FUROATE-VILANTEROL 200-25 MCG/ACT IN AEPB
1.0000 | INHALATION_SPRAY | Freq: Every day | RESPIRATORY_TRACT | Status: DC
  Administered 2023-10-27: 1 via RESPIRATORY_TRACT
  Filled 2023-10-25 (×2): qty 28

## 2023-10-25 MED ORDER — ACETAMINOPHEN 325 MG PO TABS
650.0000 mg | ORAL_TABLET | Freq: Four times a day (QID) | ORAL | Status: DC | PRN
  Filled 2023-10-25: qty 2

## 2023-10-25 MED ORDER — DEXAMETHASONE 4 MG PO TABS
2.0000 mg | ORAL_TABLET | Freq: Two times a day (BID) | ORAL | Status: DC
  Administered 2023-10-25 – 2023-10-27 (×5): 2 mg via ORAL
  Filled 2023-10-25 (×2): qty 1
  Filled 2023-10-25 (×4): qty 4

## 2023-10-25 MED ORDER — POTASSIUM CHLORIDE CRYS ER 20 MEQ PO TBCR
40.0000 meq | EXTENDED_RELEASE_TABLET | ORAL | Status: DC
  Filled 2023-10-25: qty 2

## 2023-10-25 MED ORDER — NICOTINE 14 MG/24HR TD PT24
14.0000 mg | MEDICATED_PATCH | Freq: Every day | TRANSDERMAL | Status: DC
  Administered 2023-10-25 – 2023-10-27 (×4): 14 mg via TRANSDERMAL
  Filled 2023-10-25 (×3): qty 1

## 2023-10-25 MED ORDER — ENOXAPARIN SODIUM 40 MG/0.4ML IJ SOSY
40.0000 mg | PREFILLED_SYRINGE | INTRAMUSCULAR | Status: DC
  Administered 2023-10-25 – 2023-10-26 (×3): 40 mg via SUBCUTANEOUS
  Filled 2023-10-25 (×2): qty 0.4

## 2023-10-25 MED ORDER — SODIUM CHLORIDE 0.9% FLUSH
3.0000 mL | Freq: Two times a day (BID) | INTRAVENOUS | Status: DC
  Administered 2023-10-25 – 2023-10-27 (×4): 3 mL via INTRAVENOUS

## 2023-10-25 MED ORDER — OXYCODONE HCL 5 MG PO TABS
5.0000 mg | ORAL_TABLET | ORAL | Status: DC | PRN
  Administered 2023-10-25 – 2023-10-27 (×5): 5 mg via ORAL
  Filled 2023-10-25 (×4): qty 1

## 2023-10-25 MED ORDER — MORPHINE SULFATE ER 15 MG PO TBCR: 15.0000 mg | EXTENDED_RELEASE_TABLET | Freq: Two times a day (BID) | ORAL | Status: DC

## 2023-10-25 MED ORDER — FLEET ENEMA RE ENEM
1.0000 | ENEMA | Freq: Once | RECTAL | Status: AC
  Administered 2023-10-25 (×2): 1 via RECTAL

## 2023-10-25 MED ORDER — ALBUTEROL SULFATE (2.5 MG/3ML) 0.083% IN NEBU
2.5000 mg | INHALATION_SOLUTION | Freq: Four times a day (QID) | RESPIRATORY_TRACT | Status: DC | PRN
  Administered 2023-10-27: 2.5 mg via RESPIRATORY_TRACT
  Filled 2023-10-25 (×2): qty 3

## 2023-10-25 MED ORDER — POTASSIUM CHLORIDE 10 MEQ/100ML IV SOLN
10.0000 meq | Freq: Once | INTRAVENOUS | Status: AC
  Administered 2023-10-25 (×2): 10 meq via INTRAVENOUS
  Filled 2023-10-25: qty 100

## 2023-10-25 MED ORDER — MORPHINE SULFATE ER 15 MG PO TBCR
15.0000 mg | EXTENDED_RELEASE_TABLET | Freq: Three times a day (TID) | ORAL | Status: DC
  Administered 2023-10-25 – 2023-10-27 (×6): 15 mg via ORAL
  Filled 2023-10-25 (×5): qty 1

## 2023-10-25 MED ORDER — ONDANSETRON HCL 4 MG/2ML IJ SOLN
4.0000 mg | Freq: Four times a day (QID) | INTRAMUSCULAR | Status: DC | PRN
  Administered 2023-10-26 – 2023-10-27 (×2): 4 mg via INTRAVENOUS
  Filled 2023-10-25 (×2): qty 2

## 2023-10-25 MED ORDER — POTASSIUM CHLORIDE CRYS ER 20 MEQ PO TBCR: 20.0000 meq | EXTENDED_RELEASE_TABLET | Freq: Two times a day (BID) | ORAL | Status: DC

## 2023-10-25 MED ORDER — POLYETHYLENE GLYCOL 3350 17 G PO PACK: 17.0000 g | PACK | Freq: Every day | ORAL | Status: DC | PRN

## 2023-10-25 MED ORDER — POTASSIUM CHLORIDE 20 MEQ PO PACK
40.0000 meq | PACK | Freq: Two times a day (BID) | ORAL | Status: AC
  Administered 2023-10-25 (×4): 40 meq via ORAL
  Filled 2023-10-25 (×2): qty 2

## 2023-10-25 MED ORDER — BISACODYL 5 MG PO TBEC
5.0000 mg | DELAYED_RELEASE_TABLET | Freq: Every day | ORAL | Status: DC | PRN
  Administered 2023-10-25 (×2): 5 mg via ORAL
  Filled 2023-10-25: qty 1

## 2023-10-25 MED ORDER — PANTOPRAZOLE SODIUM 40 MG PO TBEC
40.0000 mg | DELAYED_RELEASE_TABLET | Freq: Every day | ORAL | Status: DC
  Administered 2023-10-25 – 2023-10-27 (×4): 40 mg via ORAL
  Filled 2023-10-25 (×3): qty 1

## 2023-10-25 MED ORDER — HYDRALAZINE HCL 20 MG/ML IJ SOLN
5.0000 mg | INTRAMUSCULAR | Status: DC | PRN
Start: 2023-10-25 — End: 2023-10-27

## 2023-10-25 NOTE — ED Notes (Signed)
 [facility] notified of patient being on monitor but reports their system is down at this time and can't change it to red in epic

## 2023-10-25 NOTE — ED Notes (Signed)
 Soiled brief changed and pericare performed.

## 2023-10-25 NOTE — ED Triage Notes (Addendum)
 Patient arrives via ems from home for weakness since chemo treatment Thursday. Denies fevers or chills. Reports she felt like she was going to fall but lowered herself to the ground - denies trauma. States she was on the ground for 1 hour today because she was too weak to get up. Patient also reports constipation - last BM 1 week ago.

## 2023-10-25 NOTE — Telephone Encounter (Signed)
 Dr Babara has been consulted and will go see pt in hospital

## 2023-10-25 NOTE — ED Notes (Signed)
 Patient continues to want to stay on bed pan and reports she feels like she has to have BM but can't get it out.

## 2023-10-25 NOTE — ED Notes (Signed)
 Patient on bedpain after enema and still has not had BM.  Currently wants to stay on bedpan

## 2023-10-25 NOTE — Telephone Encounter (Signed)
 The patient went to the ER this morning about 5:30 AM.  Her the ER when the patient came in she was very weak.  Patient told him that she had been on her treatment for 5 days and after that gets get worse with the weakness and she is having some nausea and vomiting constipation cough and dysuria. Mother of Yvonne Scott wants the nurse for Dr. Babara to give her a call about her going into the ER

## 2023-10-25 NOTE — H&P (Signed)
 History and Physical    Patient: Yvonne Scott FMW:[id number] DOB: [birth year] DOA: 10/25/2023 DOS: the patient was seen and examined on 10/25/2023 PCP: Carin Gauze, NP  Patient coming from: Home - lives with mother; NOK: Mother, [name], [phone]   Chief Complaint: weakness, fall  HPI: AYMAR WHITFILL is a 63 y.o. female with medical history significant of invasive ductal R breast CA s/p Carboplatin /Taxol /Keytruda /mastectomy with recurrence and metastatic disease, rectal adenocarcinoma s/p resection and colostomy reversal, R lung SCC s/p radiation, depression, and COPD who presented on 8/13 with generalized weakness.  She reports progressive weakness since chemo last week, was unable to get up from the floor when she fell today.  She says that she wants to keep fighting against the cancer, but also reports needing better pain control, her mother isn't able to effectively care for her at home, she isn't sure what she will do.      ER Course:  Cancer patient, chemo Thursday, worsening generalized weakness and nausea.  K+ 2.5.  Likely needs hydration and K+.  Also with constipation, needs enema.     Review of Systems: As mentioned in the history of present illness. All other systems reviewed and are negative.  Limited by discomfort.   Past Medical History:  Diagnosis Date   Anxiety    Aortic atherosclerosis (HCC) 10/24/2016   Breast cancer (HCC)    colon cancer  and rectal   COPD (chronic obstructive pulmonary disease) (HCC)    Depression    Dyspnea    GERD (gastroesophageal reflux disease)    Hypokalemia    Hypomagnesemia    Invasive ductal carcinoma of right breast (HCC)    Metabolic acidosis    Personal history of chemotherapy    Pneumonia    Severe protein-calorie malnutrition (HCC)    Thrombocytopenia (HCC)    Past Surgical History:  Procedure Laterality Date   BREAST BIOPSY Right 2022   BRONCHIAL BIOPSY  12/29/2022   Procedure: BRONCHIAL BIOPSIES;   Surgeon: Isadora Hose, MD;  Location: [facility];  Service: Pulmonary;;   BRONCHIAL NEEDLE ASPIRATION BIOPSY  12/29/2022   Procedure: BRONCHIAL NEEDLE ASPIRATION BIOPSIES;  Surgeon: Isadora Hose, MD;  Location: [facility];  Service: Pulmonary;;   COLONOSCOPY WITH PROPOFOL  N/A 12/07/2022   Procedure: COLONOSCOPY WITH PROPOFOL ;  Surgeon: Unk Corinn Skiff, MD;  Location: [facility];  Service: Gastroenterology;  Laterality: N/A;   COLOSTOMY TAKEDOWN N/A 07/07/2023   Procedure: TAKEDOWN OF LOOP ILEOSTOMY;  Surgeon: Teresa Lonni HERO, MD;  Location: [facility];  Service: General;  Laterality: N/A;  TAKEDOWN OF LOOP ILESTOMY   FLEXIBLE SIGMOIDOSCOPY N/A 04/21/2023   Procedure: FLEXIBLE SIGMOIDOSCOPY, ICG PERFUSION;  Surgeon: Teresa Lonni HERO, MD;  Location: [facility];  Service: General;  Laterality: N/A;   FLEXIBLE SIGMOIDOSCOPY N/A 07/07/2023   Procedure: KINGSTON SIDE;  Surgeon: Teresa Lonni HERO, MD;  Location: [facility];  Service: General;  Laterality: N/A;  DIAGNOSTIC FLEXIBLE SIGMOIDOSCOPY   FOOT SURGERY Left    little toe correction   MASTECTOMY W/ SENTINEL NODE BIOPSY Right 10/19/2021   Procedure: MASTECTOMY WITH SENTINEL LYMPH NODE BIOPSY ( modified radical);  Surgeon: Rodolph Romano, MD;  Location: [facility];  Service: General;  Laterality: Right;   POLYPECTOMY  12/07/2022   Procedure: POLYPECTOMY;  Surgeon: Unk Corinn Skiff, MD;  Location[facility];  Service: Gastroenterology;;   PORTA CATH INSERTION  01/19/2021   SUBMUCOSAL TATTOO INJECTION  12/07/2022   Procedure: SUBMUCOSAL TATTOO INJECTION;  Surgeon: Unk Corinn Skiff, MD;  Location:  [facility];  Service: Gastroenterology;;   VIDEO BRONCHOSCOPY WITH ENDOBRONCHIAL ULTRASOUND N/A 12/29/2022   Procedure: VIDEO BRONCHOSCOPY WITH ENDOBRONCHIAL ULTRASOUND;  Surgeon: Isadora Hose, MD;  Location: [facility];  Service: Pulmonary;  Laterality: N/A;   XI ROBOTIC ASSISTED LOWER ANTERIOR RESECTION  N/A 04/21/2023   Procedure: XI ROBOTIC ASSISTED LOWER ANTERIOR RESECTION, ILEOSTOMY CREATION;  Surgeon: Teresa Lonni HERO, MD;  Location: [facility];  Service: General;  Laterality: N/A;   Social History:  reports that she has been smoking cigarettes. She has never used smokeless tobacco. She reports that she does not drink alcohol and does not use drugs.  No Known Allergies  Family History  Problem Relation Age of Onset   Melanoma Father 24   Cirrhosis Maternal Grandfather    Breast cancer Paternal Grandmother     Prior to Admission medications   Medication Sig Start Date End Date Taking? Authorizing Provider  budesonide -formoterol  (SYMBICORT ) 160-4.5 MCG/ACT inhaler Inhale 2 puffs into the lungs 2 (two) times daily. 01/20/23   Isadora Hose, MD  dexamethasone  (DECADRON ) 1 MG tablet Take 2 tablets (2 mg total) by mouth 2 (two) times daily with a meal. 10/19/23   Babara Call, MD  docusate sodium  (COLACE) 100 MG capsule Take 1 capsule (100 mg total) by mouth 2 (two) times daily. 10/19/23   Babara Call, MD  fluticasone  (FLONASE ) 50 MCG/ACT nasal spray Place 1 spray into both nostrils daily as needed for rhinitis or allergies. 07/05/23   Jens Durand, MD  ibuprofen  (ADVIL ) 200 MG tablet Take 200-400 mg by mouth every 6 (six) hours as needed for headache.    [provider]  lidocaine -prilocaine  (EMLA ) cream Apply 1 Application topically as needed. 10/17/23   Babara Call, MD  magnesium  oxide (MAG-OX) 400 (240 Mg) MG tablet Take 800 mg by mouth daily.    [provider]  morphine  (MS CONTIN ) 15 MG 12 hr tablet Take 1 tablet (15 mg total) by mouth every 12 (twelve) hours. 10/05/23   Babara Call, MD  naloxone  (NARCAN ) nasal spray 4 mg/0.1 mL Place 0.4 mg into the nose once as needed (as directed). Patient not taking: Reported on 10/19/2023 10/28/21   [provider]  omeprazole  (PRILOSEC) 40 MG capsule Take 1 capsule (40 mg total) by mouth daily. 10/05/23   Babara Call, MD  ondansetron  (ZOFRAN )  8 MG tablet Take 1 tablet (8 mg total) by mouth every 8 (eight) hours as needed for nausea or vomiting. 09/14/23   Babara Call, MD  oxyCODONE  (OXY IR/ROXICODONE ) 5 MG immediate release tablet Take 1 tablet (5 mg total) by mouth every 6 (six) hours as needed for severe pain (pain score 7-10) or breakthrough pain. 10/19/23   Babara Call, MD  potassium chloride  SA (KLOR-CON  M) 20 MEQ tablet Take 1 tablet (20 mEq total) by mouth daily. 09/13/23   Babara Call, MD  prochlorperazine  (COMPAZINE ) 10 MG tablet Take 1 tablet (10 mg total) by mouth every 6 (six) hours as needed for nausea or vomiting. 10/19/23   Babara Call, MD  VENTOLIN  HFA 108 (90 Base) MCG/ACT inhaler INHALE 1 PUFF BY MOUTH EVERY 6 HOURS AS NEEDED FOR SHORTNESS OF BREATH FOR WHEEZING Patient taking differently: Inhale 1-2 puffs into the lungs every 6 (six) hours as needed for shortness of breath or wheezing. 04/12/23   Carin Gauze, NP    Physical Exam: Vitals:   10/25/23 1047 10/25/23 1200 10/25/23 1300 10/25/23 1400  BP:  123/66 128/72 105/74  Pulse:  73 76 78  Resp:  14 (!) 25 17  Temp: 98.4 F (36.9 C)     TempSrc: Oral     SpO2:  99% 100% 100%  Weight:      Height:       General:  Appears frail, chronically ill, wearing cap over alopecic head Eyes:   normal lids, iris ENT:  grossly normal hearing, lips & tongue, mmm; poor dentition Cardiovascular:  RRR. No LE edema.  Respiratory:   CTA bilaterally with no wheezes/rales/rhonchi.  Normal respiratory effort. Abdomen:  soft, diffusely tender, ND; given enema with little output in the ER Skin:  no rash or induration seen on limited exam Musculoskeletal:  grossly normal tone BUE/BLE, good ROM, no bony abnormality Psychiatric:  blunted mood and affect, speech fluent and appropriate, AOx3 Neurologic:  CN 2-12 grossly intact, moves all extremities in coordinated fashion   Radiological Exams on Admission: Independently reviewed - see discussion in A/P where applicable  DG Chest Port 1  View Result Date: 10/25/2023 CLINICAL DATA:  Shortness of breath. EXAM: PORTABLE CHEST 1 VIEW COMPARISON:  PET-CT dated 08/11/2023. FINDINGS: The heart size and mediastinal contours are unchanged. Stable left chest Port-A-Cath tip overlies the superior cavoatrial junction. Aortic atherosclerosis. Known hypermetabolic lung nodules and lymph nodes are better evaluated on the prior PET-CT dated 08/11/2023. No focal consolidation, pleural effusion, or pneumothorax. No acute osseous abnormality. IMPRESSION: 1. No acute cardiopulmonary findings. 2. Known hypermetabolic lung nodules and lymph nodes are better evaluated on the prior PET-CT dated 08/11/2023. Electronically Signed   By: Harrietta Sherry M.D.   On: 10/25/2023 08:59    EKG: Independently reviewed.  NSR with rate 94; nonspecific ST changes with no evidence of acute ischemia   Labs on Admission: I have personally reviewed the available labs and imaging studies at the time of the admission.  Pertinent labs:    Na++ 133 K+ 2.5 AP 350 Albumin  3.4 AST 112/ALT 64 Normal CBC UA: small Hgb, moderate LE, 30 protein   Assessment and Plan: Principal Problem:   Generalized weakness Active Problems:   Tobacco abuse   Hypokalemia   Invasive ductal carcinoma of right breast (HCC)   Neoplasm related pain   Squamous cell lung cancer, right (HCC)   Rectal adenocarcinoma (HCC)   Pain in the abdomen   Metastasis to brain [facility])    Generalized weakness with fall in the setting of metastatic cancer, hypokalemia Patient with weakness following chemo last week Progressive and resulting in a fall today She was unable to get up and her mother was unable to help Marked hypokalemia (K+ 2.5) may be contributing However, the progressive cancer and chemotherapy effects are more likely the primary etiology Will observe for now on telemetry Correct K+  Breast/rectal/lung cancer with brain mets Saw Dr. Babara on 8/7 Resume Carboplatin  and Taxol  She was  also referred to radiation and is planned for CT stimulation this week and then 10 fractions of radiation Continue dexamethasone  Oncology consulted Worsening LFTs and AP, concerning for progressive malignancy  Abdominal pain Concern for constipation by EDP Given enema with limited results Has h/o colostomy and rectal adenoCA Will repeat CT C/A/P  Chronic pain Currently on MS Contin  15 mg TID + Oxy 5mg  q4h prn; will continue Also on dexamethasone  Palliative care consulted for assistance with pain control  COPD with tobacco dependence Cessation encouraged Nicotine  patch provided Continue Symbicort  (Breo per formulary) and Albuterol  prn     Advance Care Planning:   Code Status: Full Code - Code status was discussed  with the patient at the time of admission.  The patient would want to receive full resuscitative measures at this time.   Consults: Oncology, palliative care; nutrition; PT/OT; TOC team  DVT Prophylaxis: Lovenox   Family Communication: None present; I tried to contact her mother but the number was not working at time of admission  Severity of Illness: The appropriate patient status for this patient is OBSERVATION. Observation status is judged to be reasonable and necessary in order to provide the required intensity of service to ensure the patient's safety. The patient's presenting symptoms, physical exam findings, and initial radiographic and laboratory data in the context of their medical condition is felt to place them at decreased risk for further clinical deterioration. Furthermore, it is anticipated that the patient will be medically stable for discharge from the hospital within 2 midnights of admission.   Author: Delon Herald, MD 10/25/2023 2:50 PM  For on call review [email or web address].

## 2023-10-25 NOTE — Consult Note (Signed)
 Hematology/Oncology Consult note [phone] [phone]      Patient Care Team: Carin Gauze, NP as PCP - General (Cardiology) Babara Call, MD as Consulting Physician (Oncology)   Name of the patient: Yvonne Scott  [id number]  07-24-1960   REASON FOR COSULTATION:   History of presenting illness-  63 y.o. female with PMH listed at below who presents to ER due to weakness.  Patient is known to oncology service due to widely metastatic breast cancer, with visceral crisis.  Status post cycle 1 day 1 palliative chemotherapy carboplatin /Taxol  on 10/19/2023.  She has brain metastasis and planned for CT stimulation this week and then 10 fractions of radiation   Patient also has stage I lung cancer as well as stage I rectal cancer status post definitive treatments.  Patient reports feeling weak since the chemotherapy.  She has generalized pain for which she takes pain medication.  Very poor oral intake.  Some nausea vomiting.  She was unable to get up from the floor after a fall today.  She has severe constipation and has not had bowel movements for couple of days despite taking bowel regimens.  Reports rectal pain. Patient's mother reports that she is not able to effectively care for her at home.  In the emergency room, patient was found to have severe potassium of 2.5.    No Known Allergies  Patient Active Problem List   Diagnosis Date Noted   Squamous cell lung cancer, right (HCC) 08/12/2022    Priority: High   Invasive ductal carcinoma of right breast (HCC) 02/20/2021    Priority: High   Drug induced constipation 10/19/2023    Priority: Medium    Metastasis to brain (HCC) 10/18/2023    Priority: Medium    Cough 05/15/2023    Priority: Medium    Rectal adenocarcinoma (HCC) 10/13/2022    Priority: Medium    Neoplasm related pain 03/11/2021    Priority: Medium    Encounter for antineoplastic chemotherapy 03/11/2021    Priority: Medium    Hypokalemia  01/27/2017    Priority: Medium    Tobacco abuse 11/19/2015    Priority: Medium    Liver lesion 07/24/2023    Priority: Low   Acute bronchitis 05/15/2023    Priority: Low   Pain in the abdomen 02/06/2023    Priority: Low   Rib pain on right side 01/25/2022    Priority: Low   Vitamin D  deficiency 01/25/2022    Priority: Low   Thrush 01/25/2022    Priority: Low   B12 deficiency 01/06/2022    Priority: Low   Antineoplastic chemotherapy induced anemia 08/24/2021    Priority: Low   Port-A-Cath in place 07/07/2021    Priority: Low   Anxiety associated with cancer diagnosis (HCC) 07/07/2021    Priority: Low   Chemotherapy-induced neuropathy (HCC) 06/08/2021    Priority: Low   Hypomagnesemia 04/13/2021    Priority: Low   Goals of care, counseling/discussion 02/20/2021    Priority: Low   Generalized weakness 10/25/2023   S/P closure of ileostomy 07/07/2023   Hypotension 07/03/2023   Acute renal failure (ARF) (HCC) 07/02/2023   Acute URI 06/19/2023   Ileostomy care (HCC) 05/03/2023   S/P partial colectomy 04/21/2023   Genetic testing 01/24/2023   Abnormal PET scan of colon 12/07/2022   Mixed hyperlipidemia 06/21/2022   Cramps, muscle, general 06/21/2022   Other fatigue 06/21/2022   Protein-calorie malnutrition, severe 09/07/2021   Dysphagia 09/05/2021   Prolonged QT interval 09/05/2021  Syncope, vasovagal 09/04/2021   Syncope 09/04/2021   Neutropenic fever (HCC)    HCAP (healthcare-associated pneumonia) 07/16/2021   Sepsis (HCC) 07/16/2021   Pancytopenia (HCC)    Anxiety 06/08/2021   Generalized body aches 03/11/2021   Encounter for monitoring cardiotoxic drug therapy 02/27/2021   Metabolic acidosis 02/22/2020   DKA (diabetic ketoacidosis) (HCC) 02/21/2020   Elevated LFTs 02/21/2020   AKI (acute kidney injury) (HCC) 02/21/2020   Leukocytosis 02/21/2020   Hyponatremia 01/30/2017   Hypochloremia 01/30/2017   Hyperglycemia 01/27/2017   Back pain 12/15/2016   Chronic  lower back pain 12/15/2016   Elevated ferritin 11/07/2016   Aortic atherosclerosis (HCC) 10/24/2016   Fatty liver 10/24/2016   Thrombocytopenia (HCC) 10/24/2016   Medication monitoring encounter 11/19/2015   GERD (gastroesophageal reflux disease) 11/19/2015   Essential hypertension, benign 11/19/2015     Past Medical History:  Diagnosis Date   Anxiety    Aortic atherosclerosis (HCC) 10/24/2016   Breast cancer (HCC)    colon cancer  and rectal   COPD (chronic obstructive pulmonary disease) (HCC)    Depression    Dyspnea    GERD (gastroesophageal reflux disease)    Hypokalemia    Hypomagnesemia    Invasive ductal carcinoma of right breast (HCC)    Metabolic acidosis    Personal history of chemotherapy    Pneumonia    Severe protein-calorie malnutrition (HCC)    Thrombocytopenia (HCC)      Past Surgical History:  Procedure Laterality Date   BREAST BIOPSY Right 2022   BRONCHIAL BIOPSY  12/29/2022   Procedure: BRONCHIAL BIOPSIES;  Surgeon: Isadora Hose, MD;  Location: [facility];  Service: Pulmonary;;   BRONCHIAL NEEDLE ASPIRATION BIOPSY  12/29/2022   Procedure: BRONCHIAL NEEDLE ASPIRATION BIOPSIES;  Surgeon: Isadora Hose, MD;  Location: [facility];  Service: Pulmonary;;   COLONOSCOPY WITH PROPOFOL  N/A 12/07/2022   Procedure: COLONOSCOPY WITH PROPOFOL ;  Surgeon: Unk Corinn Skiff, MD;  Location: [facility];  Service: Gastroenterology;  Laterality: N/A;   COLOSTOMY TAKEDOWN N/A 07/07/2023   Procedure: TAKEDOWN OF LOOP ILEOSTOMY;  Surgeon: Teresa Lonni HERO, MD;  Location: [facility];  Service: General;  Laterality: N/A;  TAKEDOWN OF LOOP ILESTOMY   FLEXIBLE SIGMOIDOSCOPY N/A 04/21/2023   Procedure: FLEXIBLE SIGMOIDOSCOPY, ICG PERFUSION;  Surgeon: Teresa Lonni HERO, MD;  Location: [facility];  Service: General;  Laterality: N/A;   FLEXIBLE SIGMOIDOSCOPY N/A 07/07/2023   Procedure: KINGSTON SIDE;  Surgeon: Teresa Lonni HERO, MD;  Location: [facility];   Service: General;  Laterality: N/A;  DIAGNOSTIC FLEXIBLE SIGMOIDOSCOPY   FOOT SURGERY Left    little toe correction   MASTECTOMY W/ SENTINEL NODE BIOPSY Right 10/19/2021   Procedure: MASTECTOMY WITH SENTINEL LYMPH NODE BIOPSY ( modified radical);  Surgeon: Rodolph Romano, MD;  Location: [facility];  Service: General;  Laterality: Right;   POLYPECTOMY  12/07/2022   Procedure: POLYPECTOMY;  Surgeon: Unk Corinn Skiff, MD;  Location: [facility];  Service: Gastroenterology;;   PORTA CATH INSERTION  01/19/2021   SUBMUCOSAL TATTOO INJECTION  12/07/2022   Procedure: SUBMUCOSAL TATTOO INJECTION;  Surgeon: Unk Corinn Skiff, MD;  Location: [facility];  Service: Gastroenterology;;   VIDEO BRONCHOSCOPY WITH ENDOBRONCHIAL ULTRASOUND N/A 12/29/2022   Procedure: VIDEO BRONCHOSCOPY WITH ENDOBRONCHIAL ULTRASOUND;  Surgeon: Isadora Hose, MD;  Location: [facility];  Service: Pulmonary;  Laterality: N/A;   XI ROBOTIC ASSISTED LOWER ANTERIOR RESECTION N/A 04/21/2023   Procedure: XI ROBOTIC ASSISTED LOWER ANTERIOR RESECTION, ILEOSTOMY CREATION;  Surgeon: Teresa Lonni HERO, MD;  Location: [facility];  Service: General;  Laterality: N/A;    Social History   Socioeconomic History   Marital status: Widowed    Spouse name: Not on file   Number of children: 1   Years of education: Not on file   Highest education level: Not on file  Occupational History   Not on file  Tobacco Use   Smoking status: Every Day    Types: Cigarettes   Smokeless tobacco: Never   Tobacco comments:    3-4 cigarettes a day  Vaping Use   Vaping status: Never Used  Substance and Sexual Activity   Alcohol  use: Never    Alcohol /week: 3.0 standard drinks of alcohol     Types: 3 Standard drinks or equivalent per week   Drug use: Never   Sexual activity: Not Currently    Birth control/protection: None  Other Topics Concern   Not on file  Social History Narrative   Lives with mother   Social Drivers of Health    Financial Resource Strain: Low Risk  (01/06/2022)   Overall Financial Resource Strain (CARDIA)    Difficulty of Paying Living Expenses: Not very hard  Food Insecurity: No Food Insecurity (07/09/2023)   Hunger Vital Sign    Worried About Running Out of Food in the Last Year: Never true    Ran Out of Food in the Last Year: Never true  Transportation Needs: No Transportation Needs (07/09/2023)   PRAPARE - Administrator, Civil Service (Medical): No    Lack of Transportation (Non-Medical): No  Physical Activity: Inactive (01/06/2022)   Exercise Vital Sign    Days of Exercise per Week: 0 days    Minutes of Exercise per Session: 0 min  Stress: Stress Concern Present (01/06/2022)   [employer] of Occupational Health - Occupational Stress Questionnaire    Feeling of Stress : Rather much  Social Connections: Moderately Isolated (07/01/2023)   Social Connection and Isolation Panel    Frequency of Communication with Friends and Family: Three times a week    Frequency of Social Gatherings with Friends and Family: Three times a week    Attends Religious Services: 1 to 4 times per year    Active Member of Clubs or Organizations: No    Attends Banker Meetings: Never    Marital Status: Widowed  Intimate Partner Violence: Not At Risk (07/09/2023)   Humiliation, Afraid, Rape, and Kick questionnaire    Fear of Current or Ex-Partner: No    Emotionally Abused: No    Physically Abused: No    Sexually Abused: No     Family History  Problem Relation Age of Onset   Melanoma Father 95   Cirrhosis Maternal Grandfather    Breast cancer Paternal Grandmother      Current Facility-Administered Medications:    acetaminophen  (TYLENOL ) tablet 650 mg, 650 mg, Oral, Q6H PRN **OR** acetaminophen  (TYLENOL ) suppository 650 mg, 650 mg, Rectal, Q6H PRN, Barbarann Nest, MD   albuterol  (PROVENTIL ) (2.5 MG/3ML) 0.083% nebulizer solution 2.5 mg, 2.5 mg, Nebulization, Q6H PRN, Belue,  Nathan S, RPH   bisacodyl  (DULCOLAX) EC tablet 5 mg, 5 mg, Oral, Daily PRN, Barbarann Nest, MD   dexamethasone  (DECADRON ) tablet 2 mg, 2 mg, Oral, BID WC, Barbarann Nest, MD   docusate sodium  (COLACE) capsule 100 mg, 100 mg, Oral, BID, Barbarann Nest, MD   enoxaparin  (LOVENOX ) injection 40 mg, 40 mg, Subcutaneous, Q24H, Barbarann Nest, MD   [START ON 10/26/2023] fluticasone  furoate-vilanterol (BREO ELLIPTA ) 200-25 MCG/ACT 1  puff, 1 puff, Inhalation, Daily, Barbarann Nest, MD   hydrALAZINE  (APRESOLINE ) injection 5 mg, 5 mg, Intravenous, Q4H PRN, Barbarann Nest, MD   magnesium  oxide (MAG-OX) tablet 800 mg, 800 mg, Oral, Daily, Barbarann Nest, MD   morphine  (MS CONTIN ) 12 hr tablet 15 mg, 15 mg, Oral, Q12H, Barbarann Nest, MD   naloxone  (NARCAN ) nasal spray 4 mg/0.1 mL, 0.4 mg, Nasal, Once PRN, Barbarann Nest, MD   nicotine  (NICODERM CQ  - dosed in mg/24 hours) patch 14 mg, 14 mg, Transdermal, Daily, Barbarann Nest, MD   ondansetron  (ZOFRAN ) injection 4 mg, 4 mg, Intravenous, Q6H PRN, Barbarann Nest, MD   oxyCODONE  (Oxy IR/ROXICODONE ) immediate release tablet 5 mg, 5 mg, Oral, Q4H PRN, Barbarann Nest, MD, 5 mg at 10/25/23 1459   pantoprazole  (PROTONIX ) EC tablet 40 mg, 40 mg, Oral, Daily, Barbarann Nest, MD   polyethylene glycol (MIRALAX  / GLYCOLAX ) packet 17 g, 17 g, Oral, Daily PRN, Barbarann Nest, MD   potassium chloride  (KLOR-CON ) packet 20 mEq, 20 mEq, Oral, Once, Barbarann Nest, MD   potassium chloride  (KLOR-CON ) packet 40 mEq, 40 mEq, Oral, BID, Barbarann Nest, MD   prochlorperazine  (COMPAZINE ) tablet 10 mg, 10 mg, Oral, Q6H PRN, Barbarann Nest, MD   sodium chloride  flush (NS) 0.9 % injection 3 mL, 3 mL, Intravenous, Q12H, Barbarann Nest, MD  Current Outpatient Medications:    budesonide -formoterol  (SYMBICORT ) 160-4.5 MCG/ACT inhaler, Inhale 2 puffs into the lungs 2 (two) times daily., Disp: 1 each, Rfl: 12   dexamethasone  (DECADRON ) 1 MG tablet, Take 2 tablets (2 mg total)  by mouth 2 (two) times daily with a meal., Disp: 60 tablet, Rfl: 0   docusate sodium  (COLACE) 100 MG capsule, Take 1 capsule (100 mg total) by mouth 2 (two) times daily., Disp: 60 capsule, Rfl: 1   fluticasone  (FLONASE ) 50 MCG/ACT nasal spray, Place 1 spray into both nostrils daily as needed for rhinitis or allergies., Disp: , Rfl:    lidocaine -prilocaine  (EMLA ) cream, Apply 1 Application topically as needed., Disp: 30 g, Rfl: 5   magnesium  oxide (MAG-OX) 400 (240 Mg) MG tablet, Take 800 mg by mouth daily., Disp: , Rfl:    morphine  (MS CONTIN ) 15 MG 12 hr tablet, Take 1 tablet (15 mg total) by mouth every 12 (twelve) hours., Disp: 30 tablet, Rfl: 0   naloxone  (NARCAN ) nasal spray 4 mg/0.1 mL, Place 0.4 mg into the nose once as needed (as directed)., Disp: , Rfl:    omeprazole  (PRILOSEC) 40 MG capsule, Take 1 capsule (40 mg total) by mouth daily., Disp: 90 capsule, Rfl: 0   ondansetron  (ZOFRAN ) 8 MG tablet, Take 1 tablet (8 mg total) by mouth every 8 (eight) hours as needed for nausea or vomiting., Disp: 60 tablet, Rfl: 0   oxyCODONE  (OXY IR/ROXICODONE ) 5 MG immediate release tablet, Take 1 tablet (5 mg total) by mouth every 6 (six) hours as needed for severe pain (pain score 7-10) or breakthrough pain., Disp: 60 tablet, Rfl: 0   potassium chloride  SA (KLOR-CON  M) 20 MEQ tablet, Take 1 tablet (20 mEq total) by mouth daily., Disp: 3 tablet, Rfl: 0   prochlorperazine  (COMPAZINE ) 10 MG tablet, Take 1 tablet (10 mg total) by mouth every 6 (six) hours as needed for nausea or vomiting., Disp: 60 tablet, Rfl: 0   VENTOLIN  HFA 108 (90 Base) MCG/ACT inhaler, INHALE 1 PUFF BY MOUTH EVERY 6 HOURS AS NEEDED FOR SHORTNESS OF BREATH FOR WHEEZING (Patient taking differently: Inhale 1-2 puffs into the lungs every 6 (six) hours as  needed for shortness of breath or wheezing.), Disp: 18 g, Rfl: 0  Facility-Administered Medications Ordered in Other Encounters:    famotidine  (PEPCID ) 20-0.9 MG/50ML-% IVPB, , , ,   Review  of Systems  Constitutional:  Positive for appetite change, fatigue and unexpected weight change. Negative for chills and fever.  HENT:   Negative for hearing loss and voice change.   Eyes:  Negative for eye problems.  Respiratory:  Negative for chest tightness and cough.   Cardiovascular:  Negative for chest pain.  Gastrointestinal:  Positive for constipation and rectal pain. Negative for abdominal distention, abdominal pain and blood in stool.  Endocrine: Negative for hot flashes.  Genitourinary:  Negative for difficulty urinating and frequency.   Musculoskeletal:  Positive for arthralgias and back pain.  Skin:  Negative for itching and rash.  Neurological:  Positive for extremity weakness.  Hematological:  Negative for adenopathy.  Psychiatric/Behavioral:  Negative for confusion.     PHYSICAL EXAM Vitals:   10/25/23 1200 10/25/23 1300 10/25/23 1400 10/25/23 1506  BP: 123/66 128/72 105/74   Pulse: 73 76 78   Resp: 14 (!) 25 17   Temp:    98.1 F (36.7 C)  TempSrc:    Oral  SpO2: 99% 100% 100%   Weight:      Height:       Physical Exam Constitutional:      General: She is not in acute distress.    Appearance: She is not diaphoretic.  HENT:     Head: Normocephalic.  Eyes:     General: No scleral icterus. Cardiovascular:     Rate and Rhythm: Normal rate and regular rhythm.     Heart sounds: No murmur heard. Pulmonary:     Effort: Pulmonary effort is normal. No respiratory distress.     Comments: Decreased breath sound bilaterally Abdominal:     General: There is no distension.     Palpations: Abdomen is soft.     Tenderness: There is no abdominal tenderness.  Musculoskeletal:        General: Normal range of motion.     Cervical back: Normal range of motion and neck supple.  Skin:    General: Skin is warm and dry.     Findings: No erythema.  Neurological:     Mental Status: She is alert and oriented to person, place, and time. Mental status is at baseline.      Motor: No abnormal muscle tone.  Psychiatric:        Mood and Affect: Mood and affect normal.       LABORATORY STUDIES    Latest Ref Rng & Units 10/25/2023    6:37 AM 10/19/2023    8:41 AM 09/26/2023    8:42 AM  CBC  WBC 4.0 - 10.5 K/uL 5.9  15.9  21.5   Hemoglobin 12.0 - 15.0 g/dL 87.3  88.6  87.1   Hematocrit 36.0 - 46.0 % 39.1  34.0  38.2   Platelets 150 - 400 K/uL 249  480  403       Latest Ref Rng & Units 10/25/2023    6:37 AM 10/19/2023    8:41 AM 09/13/2023    3:00 PM  CMP  Glucose 70 - 99 mg/dL 895  882  878   BUN 8 - 23 mg/dL 25  16  12    Creatinine 0.44 - 1.00 mg/dL 9.35  9.16  9.33   Sodium 135 - 145 mmol/L 133  129  130  Potassium 3.5 - 5.1 mmol/L 2.5  3.5  3.1   Chloride 98 - 111 mmol/L 95  96  96   CO2 22 - 32 mmol/L 23  20  23    Calcium  8.9 - 10.3 mg/dL 9.2  9.2  9.2   Total Protein 6.5 - 8.1 g/dL 7.5  7.4  7.7   Total Bilirubin 0.0 - 1.2 mg/dL 0.8  0.4  0.4   Alkaline Phos 38 - 126 U/L 350  162  125   AST 15 - 41 U/L 112  22  13   ALT 0 - 44 U/L 64  13  9      RADIOGRAPHIC STUDIES: I have personally reviewed the radiological images as listed and agreed with the findings in the report. DG Chest Port 1 View Result Date: 10/25/2023 CLINICAL DATA:  Shortness of breath. EXAM: PORTABLE CHEST 1 VIEW COMPARISON:  PET-CT dated 08/11/2023. FINDINGS: The heart size and mediastinal contours are unchanged. Stable left chest Port-A-Cath tip overlies the superior cavoatrial junction. Aortic atherosclerosis. Known hypermetabolic lung nodules and lymph nodes are better evaluated on the prior PET-CT dated 08/11/2023. No focal consolidation, pleural effusion, or pneumothorax. No acute osseous abnormality. IMPRESSION: 1. No acute cardiopulmonary findings. 2. Known hypermetabolic lung nodules and lymph nodes are better evaluated on the prior PET-CT dated 08/11/2023. Electronically Signed   By: Harrietta Sherry M.D.   On: 10/25/2023 08:59   MR Brain W Wo Contrast Result Date:  10/16/2023 CLINICAL DATA:  History of breast cancer. Suspicion of metastatic disease. History of right parietal region meningioma. EXAM: MRI HEAD WITHOUT AND WITH CONTRAST TECHNIQUE: Multiplanar, multiecho pulse sequences of the brain and surrounding structures were obtained without and with intravenous contrast. CONTRAST:  4mL GADAVIST  GADOBUTROL  1 MMOL/ML IV SOLN COMPARISON:  PET scan 08/11/2023.  Brain MRI 02/24/2020. FINDINGS: Brain: No diffusion imaging was performed. There is a 3 mm metastasis within the left pons. Second 3 mm metastasis in the right side of the pons. Centrally necrotic 7 mm metastasis in the left cerebellum without edema or mass effect. Suspicion of a second 3-4 mm metastasis in the inferior left cerebellum, though this could possibly be a venous angioma. 4 mm metastasis in the right frontal lobe axial image 106. Redemonstration of a chronic benign meningioma at the left posterior parietal vertex measuring 8 mm in size without mass-effect upon the brain. 3 mm metastasis in the left frontal lobe adjacent to the frontal horn of the lateral ventricle, axial image 92. Questionable second 3 mm metastasis in a deep sulcus of the left posterior frontal lobe, axial image 105. 3 mm metastasis at the left parieto-occipital junction, axial image 99. There is a background pattern of moderate chronic small-vessel ischemic change of the hemispheric white matter. Because these metastatic lesions are somewhat low in conspicuity, consider a 3 tesla exam for better characterization, particularly if brain irradiation is being considered. No hydrocephalus.  No extra-axial collection. Vascular: Major vessels at the base of the brain show flow. Skull and upper cervical spine: No calvarial or skull base metastasis is identified. Sinuses/Orbits: Clear/normal Other: None IMPRESSION: 1. Two small metastases in the pons. 1 or 2 metastases in the left cerebellum. One metastasis in the right frontal lobe. Three metastases  in the left cerebral hemisphere. These are small lesions, the largest being in the left cerebellum with central necrosis. None of the lesions show mass effect or vasogenic edema. See impression 4. 2. Background pattern of moderate chronic small-vessel ischemic change of the  hemispheric white matter. 3. Stable 8 mm meningioma at the left posterior parietal vertex without mass-effect upon the brain. 4. Because these metastatic lesions are somewhat low in conspicuity, consider a 3 tesla exam for better characterization, particularly if stereotactic brain irradiation is being considered. Electronically Signed   By: Oneil Officer M.D.   On: 10/16/2023 16:38   US  BIOPSY (LIVER) Result Date: 09/26/2023 INDICATION: 63 year old with history of lung, rectal and breast cancer. Patient has suspicious liver lesions. Plan for ultrasound-guided liver lesion biopsy. EXAM: ULTRASOUND-GUIDED LIVER LESION BIOPSY MEDICATIONS: Moderate sedation ANESTHESIA/SEDATION: Moderate (conscious) sedation was employed during this procedure. A total of Versed  2 mg and Fentanyl  100 mcg was administered intravenously by the radiology nurse. Total intra-service moderate Sedation Time: 14 minutes. The patient's level of consciousness and vital signs were monitored continuously by radiology nursing throughout the procedure under my direct supervision. FLUOROSCOPY TIME:  None COMPLICATIONS: None immediate. PROCEDURE: Informed written consent was obtained from the patient after a thorough discussion of the procedural risks, benefits and alternatives. All questions were addressed. A timeout was performed prior to the initiation of the procedure. Liver was evaluated ultrasound. Liver lesion was identified and targeted. The right anterior abdomen was prepped with chlorhexidine  and sterile field was created. Skin was anesthetized using 1% lidocaine . Small incision was made. Using ultrasound guidance, 17 gauge coaxial needle was directed into the hepatic  lesion near the gallbladder. Total of 3 core biopsies were performed with an 18 gauge core device. Three adequate specimens obtained and placed in formalin. Gel-Foam slurry was injected as the 17 gauge needle was removed. Bandage placed over the puncture site. FINDINGS: Multiple hepatic lesions. Lesion near the gallbladder, probably within segment 4 B, was targeted. This lesion measured 3.3 x 2.8 cm. Biopsy needle confirmed within the lesion. Three adequate specimens obtained. No immediate bleeding or hematoma formation following the core biopsies. IMPRESSION: Successful ultrasound-guided core biopsies from a hepatic lesion. Electronically Signed   By: Juliene Balder M.D.   On: 09/26/2023 11:55   NM PET Image Restag (PS) Skull Base To Thigh Result Date: 08/24/2023 CLINICAL DATA:  Subsequent treatment strategy for rectal cancer. Additional history of breast cancer and colon cancer. Lung lesions. EXAM: NUCLEAR MEDICINE PET SKULL BASE TO THIGH TECHNIQUE: 6.26 mCi F-18 FDG was injected intravenously. Full-ring PET imaging was performed from the skull base to thigh after the radiotracer. CT data was obtained and used for attenuation correction and anatomic localization. Fasting blood glucose: 115 mg/dl COMPARISON:  CT 94/80/7974. Older CT scans. Pelvic MRI 01/16/2023. PET-CT 09/26/2022. Numerous other exams. FINDINGS: Mediastinal blood pool activity: SUV max 2.9 Liver activity: SUV max 3.4 NECK: No specific abnormal uptake seen above blood pool in the neck including along lymph node change of the submandibular, posterior triangle or internal jugular region. Near symmetric uptake of the visualized intracranial compartment. Incidental CT findings: The parotid glands, submandibular glands are unremarkable. Thyroid  gland is small but preserved. Visualized portions of the paranasal sinuses and mastoid air cells are clear. Leftward nasal septal deviation. Streak artifact related to the patient's dental hardware. Scattered  vascular calcifications are seen. CHEST: Since the prior PET-CT there are new hypermetabolic left-sided cluster of nodes seen along the left AP window, left hilum on the prior CT scan is again identified. The margins by CT are less well defined without the advantage of IV contrast. This area has maximum SUV value of 11.6. Additional focus in the anterior superior mediastinum more superiorly on image 40. No additional areas of  abnormal uptake above blood pool elsewhere including right hilum, axillary regions. On the prior PET-CT there were some small hypermetabolic lung nodules. Lesion seen on the prior are not seen on the current examination, resolved. However there are new hypermetabolic lung lesions identified in both upper lobes as well as in the left lower lobe. These were seen on the previous CT scan. Examples include left upper lobe lesion today on image 43 and measures 2.9 by 1.2 cm. Going back and measuring this on the prior CT in the same fashion is today this lesion would have measured 2.4 by 1.4 cm. This has maximum SUV value of 11.2. The cavitary lesion right upper lobe posteriorly which previously measured 12 x 11 mm, today has maximum SUV value of 6.1 on image 40. The medial left upper lobe focus on image 43 has maximum SUV of 3.8 and on CT is more ground-glass appearing nodule at 7 mm. The medial right lower lobe lesion has maximum SUV value of 5.0 and may be a pleural or juxtapleural nodule on image 63 measuring on the CT scan at 10 x 8 mm. Left lower lobe focus as well on image 63 has maximum SUV value of 2.7. Several other lesions as well including several along the pleural margin. Incidental CT findings: Breathing motion. No consolidation, pneumothorax or effusion. Coronary artery calcifications are seen. Heart is nonenlarged. Small pericardial effusion. The thoracic aorta is normal course and caliber with some scattered calcified plaque. Bovine type aortic arch. Left IJ chest port with tip  extending to the right atrium. ABDOMEN/PELVIS: Remotely there were hypermetabolic liver masses which had improved. However today there are several hypermetabolic lesions consistent with recurrent disease. These were described on the recent CT scan. At least 8 lesions are identified. Lesion seen measured in segment 4 on the recent CT measuring 20 x 19 mm, today has maximum SUV value of 10.0. Segment 5 lesion which previously measured 14 x 12 mm, today has maximum SUV value of 6.7 cm. There is physiologic distribution elsewhere along the parenchymal organs and renal collecting systems. There is an asymmetric area of uptake along the anterior gastric wall the antrum on image 88 with maximum SUV value 8.9. The wall is slightly nodular in this location. A gastric lesion is possible. There is some distortion on the prior contrast CT scan this location in retrospect on image 66 of series 2. In addition just caudal to the antrum of the stomach are several mesenteric nodules such as image 94. These also have some uptake. The most intense focus has maximum SUV value 4.4 and measures 11 x 8 mm. These could be peritoneal nodules versus lymph nodes. Additional adjacent foci are smaller and have less uptake but also suspicious. There is some uptake along the anterior abdominal wall right of midline with stranding. This could be postsurgical. Please correlate with history. There is 1 small subcutaneous fat nodule on image 116 measuring 5 mm which has some minimal uptake maximum SUV 3.0. Recommend attention on follow-up. Rectal mass is no longer seen. Incidental CT findings: Gallbladder is distended. Grossly the spleen, adrenal glands, pancreas are unremarkable. No abnormal calcifications seen within either kidney nor along the course of either ureter. Preserved contour to the urinary bladder. The uterus is present. Surgical changes along the rectosigmoid colon midline anterior abdominal wall hernia identified with some rectus  muscle diastasis above the umbilicus. SKELETON: Focal areas of uptake seen along the skeleton identified consistent with osseous metastatic disease. These were not seen  on the prior PET-CT. Foci includes area of maximum SUV 10.2 at the T11 vertebral body. Focus along the left side vertebral body near the pedicle at T12 with maximum SUV of 4.3. Additional focus seen along left transverse process at L5 maximum SUV of 3.0. And lastly focus in the right iliac bone above the right acetabulum on image 117 with maximum SUV of 5.3. Incidental CT findings: Scattered degenerative changes identified. IMPRESSION: Overall progression of disease. As seen on the recent CT scan hypermetabolic lymph nodes the left side of the mediastinum, AP window and hilum. Additional smaller focus seen in the upper anterior mediastinum at the level of the manubrium. Developing multiple hypermetabolic lung nodules including several which are juxtapleural including the dominant lesion medially along the left upper lobe anteriorly. Development of multiple hypermetabolic liver lesions. At least 8 lesions are seen. Focal nodular area of wall thickening along the anterior aspect of the antrum of the stomach with abnormal uptake. In addition there is adjacent mesenteric nodules just caudal to the antrum of the stomach which could represent peritoneal nodules versus abnormal lymph nodes. Four hypermetabolic bone lesions consistent with osseous metastatic disease. Areas include right iliac bone above the acetabulum as well as along the spine at T11, T12 and L5. Surgical changes. There is a small subcutaneous nodule at the level of the pelvis in the midline anteriorly with some minimal uptake. Attention on follow-up. Electronically Signed   By: Ranell Bring M.D.   On: 08/24/2023 14:15   MM 3D DIAGNOSTIC MAMMOGRAM UNILATERAL LEFT BREAST Result Date: 08/09/2023 CLINICAL DATA:  Delayed screening recall for a possible LEFT breast asymmetry, noted on  screening mammogram from September 2023. Patient is status post RIGHT breast mastectomy. EXAM: DIGITAL DIAGNOSTIC UNILATERAL LEFT MAMMOGRAM WITH TOMOSYNTHESIS AND CAD TECHNIQUE: Left digital diagnostic mammography and breast tomosynthesis was performed. The images were evaluated with computer-aided detection. COMPARISON:  Previous exam(s). ACR Breast Density Category c: The breasts are heterogeneously dense, which may obscure small masses. FINDINGS: The previously described possible asymmetry seen on the MLO view does not persist with additional views, consistent with superimposed fibroglandular tissue. No suspicious mass, microcalcification, or other finding is identified in the left breast. IMPRESSION: No mammographic evidence of malignancy in the LEFT breast. RECOMMENDATION: Return to routine screening mammography is recommended. The patient will be due for screening in May 2026. I have discussed the findings and recommendations with the patient. If applicable, a reminder letter will be sent to the patient regarding the next appointment. BI-RADS CATEGORY  1: Negative. Electronically Signed   By: Dirk Arrant M.D.   On: 08/09/2023 14:44   CT CHEST ABDOMEN PELVIS W CONTRAST Result Date: 08/01/2023 CLINICAL DATA:  Restaging, history of breast cancer rectal cancer and lung cancer EXAM: CT CHEST, ABDOMEN, AND PELVIS WITH CONTRAST TECHNIQUE: Multidetector CT imaging of the chest, abdomen and pelvis was performed following the standard protocol during bolus administration of intravenous contrast. RADIATION DOSE REDUCTION: This exam was performed according to the departmental dose-optimization program which includes automated exposure control, adjustment of the mA and/or kV according to patient size and/or use of iterative reconstruction technique. CONTRAST:  OMNIPAQUE  IOHEXOL  300 MG/ML  SOLN COMPARISON:  CT of the chest performed December 28, 2022 and CT of the abdomen and pelvis performed July 01, 2023  FINDINGS: CT CHEST FINDINGS Cardiovascular: Normal size heart. No pericardial effusion. Calcific atherosclerosis is present in the left and right coronary territories. Left-sided approach central venous port. Mediastinum/Nodes: Abnormal enhancing left hilar and  mediastinal lymph nodes are present. At least 3 left hilar and mediastinal lymph nodes are present. A representative node on image 25 of CT series 2 measuring 1.7 x 1.4 cm. A second left hilar node measures 2.1 x 1.2 cm on image 27 of series 2. Lungs/Pleura: No pleural effusion or pneumothorax.  Emphysema. Medial left upper lobe pulmonary nodule measuring 2.2 x 1.4, image 19 of series 2, previously 1.1 x 0.7 cm. Right upper lobe cavitary nodule measuring 1.2 x 1.1 cm on image 17 of series 2, not previously seen. Right lower lobe pleural base nodule measures 1.1 x 0.8 cm on image 40 of series 2, not previously seen. Left lobe upper lobe noncalcified pulmonary nodule measuring 0.9 x 0.7 cm on image 43 of series 2, previously 5 mm. A triangular shaped density is also present in the left lower lobe on image 46 posteriorly which may represent a focus of atelectasis. Musculoskeletal: Postsurgical changes from right mastectomy. CT ABDOMEN PELVIS FINDINGS Hepatobiliary: Numerous liver lesions are present when notably no liver lesions were noted on CT of the abdomen and pelvis performed May 25, 2022. for measurement purposes, reference imaging from July 01, 2023. Liver segment 4B lesion measures 2.1 x 1.9 cm, previously 1.9 cm, image 62 of series 2. Liver segment 5 lesion measures 1.4 x 1.2 cm, previously 1.2 cm, image 60 of series 2. At least 3 additional smaller lesions are present in liver segment 7. Pancreas: Unremarkable. No pancreatic ductal dilatation or surrounding inflammatory changes. Spleen: Normal in size without focal abnormality. Adrenals/Urinary Tract: Adrenal glands are unremarkable. Kidneys are normal, without renal calculi, focal lesion, or  hydronephrosis. Bladder is unremarkable. Stomach/Bowel: No dilated loops of bowel are seen. Postsurgical changes within tear canal stenosis in the right pelvis and left pelvis. Vascular/Lymphatic: No significant vascular findings are present. No enlarged abdominal or pelvic lymph nodes. Reproductive: Uterus and bilateral adnexa are unremarkable. Other: Nothing significant. Musculoskeletal: No acute or significant osseous findings. IMPRESSION: Chest: 1. Interval increase in multiple pulmonary nodules which are suspicious for pulmonary metastatic disease. The largest is in the medial left upper lobe and measures 2.2 cm. 2. Left hilar and mediastinal lymphadenopathy is present with the largest left hilar lymph node measuring 2.1 cm. Abdomen and pelvis: 1. Progression of liver metastatic disease from July 01, 2023 and most notably from May 25, 2022. The largest liver lesion is estimated at 2.1 cm in largest dimension. Electronically Signed   By: Maude Naegeli M.D.   On: 08/01/2023 08:25     Assessment and plan-   # Generalized weakness Multifactorial due to metastatic cancer, hypokalemia. Potassium repleted per primary team. PT OT evaluation.  # Widely metastatic breast cancer, involving brain, lung, liver, bone She is in visceral crisis. CT chest abdomen pelvis imaging findings were reviewed.  Disease progression comparing to her PET scan in June 2025.  She has had delay of diagnosis and treatments due to multiple no-show off her imaging and biopsy appointments. Status post cycle 1 day 1 palliative chemotherapy with carboplatin  and Taxol . Goals of care was reviewed and discussed with patient.  She understands that her condition is progressing and is not curable.  Prognosis is poor.  She desires further treatments.  Palliative care service has been consulted.  # Brain metastasis, patient has established care with radiation oncology and there is plan for brain radiation.  Continue dexamethasone  2 mg  twice daily  # Severe constipation, likely drug-induced.  Aggressive bowel regimen and enema. # COPD continue outpatient  inhalers. # Rapid progression of metastatic hypermetabolic lung lesions with invasion of the anterior mediastinum, occlusion of left upper lobe bronchi. Could be secondary to metastatic breast cancer versus metastatic from lung cancer.  Current palliative chemotherapy regimen will cover both histology.  Consider future bronchoscopy for clarification of tissue of origin.  She would also benefit from outpatient palliative radiation to the left upper lobe occlusive bronchi.  # Neoplasm related pain, continue MS Contin  50 mg 3 times daily plus oxycodone  5 mg every 4 hours as needed.  Also on dexamethasone .  Thank you for allowing me to participate in the care of this patient.   [name], MD, PhD Hematology Oncology 10/25/2023

## 2023-10-25 NOTE — ED Notes (Signed)
 Patient transported to CT

## 2023-10-25 NOTE — Group Note (Deleted)
 Date:  10/25/2023 Time:  2:22 PM  Group Topic/Focus:  Wellness Toolbox:   The focus of this group is to discuss various aspects of wellness, balancing those aspects and exploring ways to increase the ability to experience wellness.  Patients will create a wellness toolbox for use upon discharge.     Participation Level:  {BHH PARTICIPATION OZCZO:77735}  Participation Quality:  {BHH PARTICIPATION QUALITY:22265}  Affect:  {BHH AFFECT:22266}  Cognitive:  {BHH COGNITIVE:22267}  Insight: {BHH Insight2:20797}  Engagement in Group:  {BHH ENGAGEMENT IN HMNLE:77731}  Modes of Intervention:  {BHH MODES OF INTERVENTION:22269}  Additional Comments:  ***  Myra Curtistine BROCKS 10/25/2023, 2:22 PM

## 2023-10-25 NOTE — Telephone Encounter (Signed)
 The mother called back today and told me that she will  being admitted.  At this time they are still waiting for bed.  And her legs are just getting more weaker and weaker, the pain medicine is really not doing good for her pain.  She was so weak in her bedroom that she felt into the floor.  Her mother had to pick her up and put her back in the bed.

## 2023-10-25 NOTE — ED Provider Notes (Signed)
 [facility] Provider Note    Event Date/Time   First MD Initiated Contact with Patient 10/25/23 [phone]     (approximate)   History   Weakness   HPI  SANAIYA WELLIVER is a 63 y.o. female with a history of metastatic breast cancer, COPD, hypokalemia, thrombocytopenia, and GERD who presents with generalized weakness.  The patient states that she received chemo 5 days ago.  She states that over the last several days she has become increasingly weak.  She fell like she was going to fall but lowered herself to the ground.  She then was too weak to get up off the floor for about an hour.  She reports generalized pain which has been chronic.  She has had nausea and vomiting but no diarrhea.  She reports constipation.  She also has some productive cough, and reports dysuria.  I reviewed the past medical records.  The patient was most recently seen by medical oncology on 8/7 for follow-up.  She reported a poor appetite and weight loss at that time.   Physical Exam   Triage Vital Signs: ED Triage Vitals  Encounter Vitals Group     BP 10/25/23 0633 111/75     Girls Systolic BP Percentile --      Girls Diastolic BP Percentile --      Boys Systolic BP Percentile --      Boys Diastolic BP Percentile --      Pulse Rate 10/25/23 0633 98     Resp 10/25/23 0633 (!) 22     Temp 10/25/23 0633 98 F (36.7 C)     Temp Source 10/25/23 0633 Oral     SpO2 10/25/23 0633 97 %     Weight 10/25/23 0632 103 lb (46.7 kg)     Height 10/25/23 0632 5' 2 (1.575 m)     Head Circumference --      Peak Flow --      Pain Score --      Pain Loc --      Pain Education --      Exclude from Growth Chart --     Most recent vital signs: Vitals:   10/25/23 1047 10/25/23 1200  BP:  123/66  Pulse:  73  Resp:  14  Temp: 98.4 F (36.9 C)   SpO2:  99%     General: Alert, weak appearing, no distress.  CV:  Good peripheral perfusion.  Resp:  Normal effort.  Lungs CTAB. Abd:  Soft with  mild diffuse discomfort.  No distention.  Other:  Dry mucous membranes.   ED Results / Procedures / Treatments   Labs (all labs ordered are listed, but only abnormal results are displayed) Labs Reviewed  COMPREHENSIVE METABOLIC PANEL WITH GFR - Abnormal; Notable for the following components:      Result Value   Sodium 133 (*)    Potassium 2.5 (*)    Chloride 95 (*)    Glucose, Bld 104 (*)    BUN 25 (*)    Albumin  3.4 (*)    AST 112 (*)    ALT 64 (*)    Alkaline Phosphatase 350 (*)    All other components within normal limits  URINALYSIS, ROUTINE W REFLEX MICROSCOPIC - Abnormal; Notable for the following components:   Color, Urine AMBER (*)    APPearance CLOUDY (*)    Hgb urine dipstick SMALL (*)    Protein, ur 30 (*)    Leukocytes,Ua MODERATE (*)  Non Squamous Epithelial PRESENT (*)    All other components within normal limits  CBC WITH DIFFERENTIAL/PLATELET - Abnormal; Notable for the following components:   Abs Immature Granulocytes 0.13 (*)    All other components within normal limits  MAGNESIUM   PHOSPHORUS  LIPASE, BLOOD     EKG  ED ECG REPORT I, Waylon Cassis, the attending physician, personally viewed and interpreted this ECG.  Date: 10/25/2023 EKG Time: 0634 Rate: 94 Rhythm: normal sinus rhythm QRS Axis: normal Intervals: Nonspecific IVCD ST/T Wave abnormalities: Nonspecific ST abnormalities Narrative Interpretation: no evidence of acute ischemia    RADIOLOGY  Chest x-ray: I independently viewed and interpreted the images; there is no focal consolidation or edema   PROCEDURES:  Critical Care performed: No  Procedures   MEDICATIONS ORDERED IN ED: Medications  sodium chloride  0.9 % bolus 1,000 mL (0 mLs Intravenous Stopped 10/25/23 1218)  oxyCODONE  (Oxy IR/ROXICODONE ) immediate release tablet 5 mg (5 mg Oral Given 10/25/23 0827)  potassium chloride  10 mEq in 100 mL IVPB (0 mEq Intravenous Stopped 10/25/23 1121)  potassium chloride  10 mEq  in 100 mL IVPB (0 mEq Intravenous Stopped 10/25/23 1121)  sodium phosphate  (FLEET) enema 1 enema (1 enema Rectal Given 10/25/23 1129)     IMPRESSION / MDM / ASSESSMENT AND PLAN / ED COURSE  I reviewed the triage vital signs and the nursing notes.  63 year old female with PMH as noted above presents with generalized weakness, nausea and vomiting, diffuse pain, all worsening after she last had chemo 6 days ago.  On exam the patient is weak appearing with dry mucous membranes.  Blood pressure is borderline low.  Other vital signs are normal.  Differential diagnosis includes, but is not limited to, postchemotherapy symptoms, dehydration, electrolyte abnormality, other metabolic disturbance, UTI, other infection.  We will obtain chest x-ray, lab workup, give fluids, and reassess.  Patient's presentation is most consistent with acute presentation with potential threat to life or bodily function.  The patient is on the cardiac monitor to evaluate for evidence of arrhythmia and/or significant heart rate changes.  ----------------------------------------- 12:34 PM on 10/25/2023 -----------------------------------------  Lab workup is significant for hypokalemia.  I have ordered IV potassium.  Alkaline phosphatase is also elevated although this appears to be worsened from a chronic process.  CBC is unremarkable.  Urinalysis shows some WBCs but no clear findings to suggest UTI.  The patient reports some rectal pressure.  I performed a DRE and found some hard stool there, but not a significant massive impacted stool amenable to manual disimpaction.  I have ordered an enema.  The patient will need admission for further management.  I consulted Dr. Barbarann from the hospitalist service; based on our discussion she agrees to evaluate the patient for admission.   FINAL CLINICAL IMPRESSION(S) / ED DIAGNOSES   Final diagnoses:  Generalized weakness  Hypokalemia  Constipation, unspecified constipation type      Rx / DC Orders   ED Discharge Orders     None        Note:  This document was prepared using Dragon voice recognition software and may include unintentional dictation errors.    Cassis Waylon, MD 10/25/23 1235

## 2023-10-26 ENCOUNTER — Inpatient Hospital Stay: Admitting: Hospice and Palliative Medicine

## 2023-10-26 ENCOUNTER — Inpatient Hospital Stay

## 2023-10-26 ENCOUNTER — Ambulatory Visit

## 2023-10-26 ENCOUNTER — Inpatient Hospital Stay: Admitting: Oncology

## 2023-10-26 DIAGNOSIS — R54 Age-related physical debility: Secondary | ICD-10-CM | POA: Diagnosis present

## 2023-10-26 DIAGNOSIS — J449 Chronic obstructive pulmonary disease, unspecified: Secondary | ICD-10-CM | POA: Diagnosis present

## 2023-10-26 DIAGNOSIS — C7951 Secondary malignant neoplasm of bone: Secondary | ICD-10-CM | POA: Diagnosis present

## 2023-10-26 DIAGNOSIS — Z9221 Personal history of antineoplastic chemotherapy: Secondary | ICD-10-CM | POA: Diagnosis not present

## 2023-10-26 DIAGNOSIS — I700 Atherosclerosis of aorta: Secondary | ICD-10-CM | POA: Diagnosis present

## 2023-10-26 DIAGNOSIS — Z66 Do not resuscitate: Secondary | ICD-10-CM | POA: Diagnosis present

## 2023-10-26 DIAGNOSIS — Z7951 Long term (current) use of inhaled steroids: Secondary | ICD-10-CM | POA: Diagnosis not present

## 2023-10-26 DIAGNOSIS — R000 Tachycardia, unspecified: Secondary | ICD-10-CM | POA: Diagnosis present

## 2023-10-26 DIAGNOSIS — Z923 Personal history of irradiation: Secondary | ICD-10-CM | POA: Diagnosis not present

## 2023-10-26 DIAGNOSIS — R531 Weakness: Secondary | ICD-10-CM | POA: Diagnosis not present

## 2023-10-26 DIAGNOSIS — Z808 Family history of malignant neoplasm of other organs or systems: Secondary | ICD-10-CM | POA: Diagnosis not present

## 2023-10-26 DIAGNOSIS — C3491 Malignant neoplasm of unspecified part of right bronchus or lung: Secondary | ICD-10-CM | POA: Diagnosis present

## 2023-10-26 DIAGNOSIS — I959 Hypotension, unspecified: Secondary | ICD-10-CM | POA: Diagnosis present

## 2023-10-26 DIAGNOSIS — W19XXXA Unspecified fall, initial encounter: Secondary | ICD-10-CM | POA: Diagnosis present

## 2023-10-26 DIAGNOSIS — K5900 Constipation, unspecified: Secondary | ICD-10-CM | POA: Diagnosis present

## 2023-10-26 DIAGNOSIS — G893 Neoplasm related pain (acute) (chronic): Secondary | ICD-10-CM | POA: Diagnosis present

## 2023-10-26 DIAGNOSIS — Z9011 Acquired absence of right breast and nipple: Secondary | ICD-10-CM | POA: Diagnosis not present

## 2023-10-26 DIAGNOSIS — C50911 Malignant neoplasm of unspecified site of right female breast: Secondary | ICD-10-CM | POA: Diagnosis present

## 2023-10-26 DIAGNOSIS — Z515 Encounter for palliative care: Secondary | ICD-10-CM

## 2023-10-26 DIAGNOSIS — C3412 Malignant neoplasm of upper lobe, left bronchus or lung: Secondary | ICD-10-CM | POA: Diagnosis present

## 2023-10-26 DIAGNOSIS — F17210 Nicotine dependence, cigarettes, uncomplicated: Secondary | ICD-10-CM | POA: Diagnosis present

## 2023-10-26 DIAGNOSIS — C787 Secondary malignant neoplasm of liver and intrahepatic bile duct: Secondary | ICD-10-CM | POA: Diagnosis present

## 2023-10-26 DIAGNOSIS — C7931 Secondary malignant neoplasm of brain: Secondary | ICD-10-CM | POA: Diagnosis present

## 2023-10-26 DIAGNOSIS — C20 Malignant neoplasm of rectum: Secondary | ICD-10-CM | POA: Diagnosis present

## 2023-10-26 DIAGNOSIS — E876 Hypokalemia: Secondary | ICD-10-CM | POA: Diagnosis present

## 2023-10-26 DIAGNOSIS — K219 Gastro-esophageal reflux disease without esophagitis: Secondary | ICD-10-CM | POA: Diagnosis present

## 2023-10-26 LAB — CBC
HCT: 34.1 % — ABNORMAL LOW (ref 36.0–46.0)
Hemoglobin: 11 g/dL — ABNORMAL LOW (ref 12.0–15.0)
MCH: 26.4 pg (ref 26.0–34.0)
MCHC: 32.3 g/dL (ref 30.0–36.0)
MCV: 81.8 fL (ref 80.0–100.0)
Platelets: 191 K/uL (ref 150–400)
RBC: 4.17 MIL/uL (ref 3.87–5.11)
RDW: 14.6 % (ref 11.5–15.5)
WBC: 4.9 K/uL (ref 4.0–10.5)
nRBC: 0 % (ref 0.0–0.2)

## 2023-10-26 LAB — COMPREHENSIVE METABOLIC PANEL WITH GFR
ALT: 39 U/L (ref 0–44)
AST: 30 U/L (ref 15–41)
Albumin: 3 g/dL — ABNORMAL LOW (ref 3.5–5.0)
Alkaline Phosphatase: 255 U/L — ABNORMAL HIGH (ref 38–126)
Anion gap: 13 (ref 5–15)
BUN: 23 mg/dL (ref 8–23)
CO2: 20 mmol/L — ABNORMAL LOW (ref 22–32)
Calcium: 8.4 mg/dL — ABNORMAL LOW (ref 8.9–10.3)
Chloride: 103 mmol/L (ref 98–111)
Creatinine, Ser: 0.55 mg/dL (ref 0.44–1.00)
GFR, Estimated: >60 mL/min (ref >60)
Glucose, Bld: 66 mg/dL — ABNORMAL LOW (ref 70–99)
Potassium: 3.4 mmol/L — ABNORMAL LOW (ref 3.5–5.1)
Sodium: 136 mmol/L (ref 135–145)
Total Bilirubin: 0.8 mg/dL (ref 0.0–1.2)
Total Protein: 6.4 g/dL — ABNORMAL LOW (ref 6.5–8.1)

## 2023-10-26 MED ORDER — POTASSIUM CHLORIDE 20 MEQ PO PACK
40.0000 meq | PACK | Freq: Once | ORAL | Status: AC
  Administered 2023-10-26: 40 meq via ORAL
  Filled 2023-10-26: qty 2

## 2023-10-26 MED ORDER — SODIUM CHLORIDE 0.9 % IV BOLUS
250.0000 mL | Freq: Once | INTRAVENOUS | Status: AC
  Administered 2023-10-26: 250 mL via INTRAVENOUS

## 2023-10-26 NOTE — Evaluation (Signed)
 Occupational Therapy Evaluation Patient Details Name: Yvonne Scott MRN: [id number] DOB: 09-20-1960 Today's Date: 10/26/2023   History of Present Illness   63 y.o. female with medical history significant of invasive ductal R breast CA s/p Carboplatin /Taxol /Keytruda /mastectomy with recurrence and metastatic disease, rectal adenocarcinoma s/p resection and colostomy reversal, R lung SCC s/p radiation, depression, and COPD who presented on 8/13 with generalized weakness.  She reports progressive weakness since chemo last week, was unable to get up from the floor when she fell     Clinical Impressions Pt was seen for OT evaluation this date. Prior to hospital admission, pt was MODI in all ADL/IADLs, acting as caregiver for both parents in different homes. Pt lives in both parents home (apartment/mobile home), going back and forth to care for them. Pt presents with deficits in safety awareness, DME management and generalized weakness affecting safe and optimal ADL completion. Pt currently requires supervision for bed mobility, setupA for Lb dressing, CGA for amb around room and for toilet transfer with RW. Pt required MINA for pericare and MAXA for donning new brief in standing. Pt returned to bed with all needs in reach. VSS. Pt would benefit from skilled OT services to address noted impairments and functional limitations (see below for any additional details) in order to maximize safety and independence while minimizing falls risk and caregiver burden. OT will follow acutely.      If plan is discharge home, recommend the following:   A little help with walking and/or transfers;A little help with bathing/dressing/bathroom;Assistance with cooking/housework;Assist for transportation     Functional Status Assessment   Patient has had a recent decline in their functional status and demonstrates the ability to make significant improvements in function in a reasonable and predictable amount of  time.     Equipment Recommendations   Other (comment) (Defer to next venue of care)     Recommendations for Other Services         Precautions/Restrictions   Precautions Precautions: Fall Recall of Precautions/Restrictions: Intact Restrictions Weight Bearing Restrictions Per Provider Order: Yes     Mobility Bed Mobility Overal bed mobility: Needs Assistance Bed Mobility: Supine to Sit, Sit to Supine     Supine to sit: Supervision Sit to supine: Supervision   General bed mobility comments: Pt showed good effort, needed only minimal assist with LEs to get to/from EOB    Transfers Overall transfer level: Modified independent Equipment used: Rolling walker (2 wheels)               General transfer comment: CGA throughout for line management      Balance Overall balance assessment: Needs assistance Sitting-balance support: No upper extremity supported Sitting balance-Leahy Scale: Good Sitting balance - Comments: Steady reaching outside BOS during LB dressing   Standing balance support: Bilateral upper extremity supported Standing balance-Leahy Scale: Fair Standing balance comment: Improved balance with RW, able to static stand without UE support                           ADL either performed or assessed with clinical judgement   ADL Overall ADL's : Needs assistance/impaired     Grooming: Wash/dry face;Wash/dry hands;Sitting;Set up               Lower Body Dressing: Moderate assistance;Sit to/from stand   Toilet Transfer: Contact guard assist;Rolling walker (2 wheels);BSC/3in1   Toileting- Clothing Manipulation and Hygiene: Minimal assistance;Sit to/from stand  Functional mobility during ADLs: Contact guard assist;Rolling walker (2 wheels)                                  Pertinent Vitals/Pain Pain Assessment Pain Assessment: 0-10 Pain Score: 5  Pain Location: Bottom Pain Descriptors / Indicators:  Grimacing Pain Intervention(s): Limited activity within patient's tolerance, Monitored during session, Repositioned     Extremity/Trunk Assessment Upper Extremity Assessment Upper Extremity Assessment: Generalized weakness   Lower Extremity Assessment Lower Extremity Assessment: Defer to PT evaluation;Generalized weakness   Cervical / Trunk Assessment Cervical / Trunk Assessment: Normal   Communication Communication Communication: No apparent difficulties   Cognition Arousal: Alert Behavior During Therapy: WFL for tasks assessed/performed Cognition: No family/caregiver present to determine baseline             OT - Cognition Comments: A/Ox4                 Following commands: Intact       Cueing  General Comments   Cueing Techniques: Verbal cues      Exercises Exercises: Other exercises Other Exercises Other Exercises: Edu: Role of OT eval, safe ADL completion, fall prevention techniques, DME recomendations   Shoulder Instructions      Home Living Family/patient expects to be discharged to:: Private residence Living Arrangements: Parent   Type of Home: Mobile home Home Access: Level entry     Home Layout: One level     Bathroom Shower/Tub: Chief Strategy Officer: Standard     Home Equipment: Agricultural consultant (2 wheels);Rollator (4 wheels);Cane - single point   Additional Comments: Pt reports she stays with her mother at night (apartment with no steps) but daily helps her father who has 8 steps to enter      Prior Functioning/Environment Prior Level of Function : Independent/Modified Independent             Mobility Comments: Pt reports she drives, runs errands, helps parents but is more recently struggling with this ADLs Comments: reports independent    OT Problem List: Decreased strength;Decreased activity tolerance;Impaired balance (sitting and/or standing);Decreased safety awareness;Decreased knowledge of use of DME or  AE;Decreased knowledge of precautions   OT Treatment/Interventions: Self-care/ADL training;Energy conservation;DME and/or AE instruction;Therapeutic activities;Patient/family education;Balance training      OT Goals(Current goals can be found in the care plan section)   Acute Rehab OT Goals Patient Stated Goal: Feel better OT Goal Formulation: With patient Time For Goal Achievement: 11/09/23 Potential to Achieve Goals: Good ADL Goals Pt Will Perform Grooming: with set-up;standing Pt Will Perform Lower Body Dressing: with modified independence;sit to/from stand Pt Will Transfer to Toilet: with modified independence;bedside commode Pt Will Perform Toileting - Clothing Manipulation and hygiene: with modified independence;sit to/from stand   OT Frequency:  Min 1X/week    Co-evaluation              AM-PAC OT 6 Clicks Daily Activity     Outcome Measure Help from another person eating meals?: None Help from another person taking care of personal grooming?: A Little Help from another person toileting, which includes using toliet, bedpan, or urinal?: A Little Help from another person bathing (including washing, rinsing, drying)?: A Little Help from another person to put on and taking off regular upper body clothing?: A Little Help from another person to put on and taking off regular lower body clothing?: None 6 Click Score: 20  End of Session Equipment Utilized During Treatment: Rolling walker (2 wheels) Nurse Communication: Mobility status  Activity Tolerance: Patient tolerated treatment well Patient left: in bed;with call bell/phone within reach  OT Visit Diagnosis: Unsteadiness on feet (R26.81);Other abnormalities of gait and mobility (R26.89);Muscle weakness (generalized) (M62.81)                Time: 8758-8692 OT Time Calculation (min): 26 min Charges:  OT General Charges $OT Visit: 1 Visit OT Evaluation $OT Eval Low Complexity: 1 Low OT Treatments $Self Care/Home  Management : 8-22 mins  Larraine Colas M.S. OTR/L  10/26/23, 2:09 PM

## 2023-10-26 NOTE — Progress Notes (Signed)
 MEWS Progress Note  Patient Details Name: Yvonne Scott MRN: [id number] DOB: June 13, 1960 Today's Date: 10/26/2023   MEWS Flowsheet Documentation:  Assess: MEWS Score Temp: 98.8 F (37.1 C) BP: (!) 88/70 MAP (mmHg): 77 Pulse Rate: (!) 126 ECG Heart Rate: 83 Resp: 20 Level of Consciousness: Alert SpO2: 92 % O2 Device: Room Air Assess: MEWS Score MEWS Temp: 0 MEWS Systolic: 1 MEWS Pulse: 2 MEWS RR: 0 MEWS LOC: 0 MEWS Score: 3 MEWS Score Color: Yellow Assess: SIRS CRITERIA SIRS Temperature : 0 SIRS Respirations : 0 SIRS Pulse: 1 SIRS WBC: 0 SIRS Score Sum : 1 SIRS Temperature : 0 SIRS Pulse: 1 SIRS Respirations : 0 SIRS WBC: 0 SIRS Score Sum : 1 Assess: if the MEWS score is Yellow or Red Were vital signs accurate and taken at a resting state?: Yes Does the patient meet 2 or more of the SIRS criteria?: No MEWS guidelines implemented : No, previously red, continue vital signs every 4 hours Treat MEWS Interventions: Considered administering scheduled or prn medications/treatments as ordered Take Vital Signs Increase Vital Sign Frequency : Red: Q1hr x2, continue Q4hrs until patient remains green for 12hrs Escalate MEWS: Escalate: Red: Discuss with charge nurse and notify provider. Consider notifying RRT. If remains red for 2 hours consider need for higher level of care   bolus ordered and given, Red mews guidelines implemented.       Arnaldo GORMAN Agee 10/26/2023, 11:12 PM

## 2023-10-26 NOTE — TOC Initial Note (Signed)
 Transition of Care [facility]) - Initial/Assessment Note    Patient Details  Name: Yvonne Scott MRN: [id number] Date of Birth: Oct 24, 1960  [facility]) CM/SW Contact:    Edsel Yvonne Fischer, LCSW Phone Number: 10/26/2023, 5:17 PM  Clinical Narrative:                   SW met with pt at bedside.  Per pt report: She is taking Chemo and stays at home with her elderly parents.  Safety/ DV concerns: No. PCP:  Dr. Carin and Pharmacy: [facility].  HH history: no. Pt stated that she does want HH at this time. Pt stated that she does not drive and that her mother usually picks her up but she will need transportation from hospital once discharged.  Social Support: none.  Pt denies financial concerns or issues paying for meds.  Pt stated that she takes her meds as prescribed.  Incontinence: Pull ups.  CPAP/ Oxygen:  No.  Dialysis: No.  Veteran: No.  Pt stated that she received SS/ SSI about $900 a month.  Pt stated that her siblings have pasted and pt began taking about a romantic relationship but after saying she was with him for over a year she stopped.  Pt stated that she would like to go to Rehab and long term would like ALF.  Pt did not have preference for rehab or ALF and stated that she just would like to stay in [place], [place].       Expected Discharge Plan: Skilled Nursing Facility Barriers to Discharge: Continued Medical Work up   Patient Goals and CMS Choice            Expected Discharge Plan and Services In-house Referral: Clinical Social Work   Post Acute Care Choice: Skilled Nursing Facility Living arrangements for the past 2 months: Single Family Home                                      Prior Living Arrangements/Services Living arrangements for the past 2 months: Single Family Home Lives with:: Parents   Do you feel safe going back to the place where you live?: Yes          Current home services: DME Adult nurse (2 wheels);Rollator (4  wheels);Cane - single point) Criminal Activity/Legal Involvement Pertinent to Current Situation/Hospitalization: No - Comment as needed  Activities of Daily Living   ADL Screening (condition at time of admission) Independently performs ADLs?: No Does the patient have a NEW difficulty with bathing/dressing/toileting/self-feeding that is expected to last >3 days?: Yes (Initiates electronic notice to provider for possible OT consult) Does the patient have a NEW difficulty with getting in/out of bed, walking, or climbing stairs that is expected to last >3 days?: Yes (Initiates electronic notice to provider for possible PT consult) Does the patient have a NEW difficulty with communication that is expected to last >3 days?: No Is the patient deaf or have difficulty hearing?: No Does the patient have difficulty seeing, even when wearing glasses/contacts?: No Does the patient have difficulty concentrating, remembering, or making decisions?: No  Permission Sought/Granted                  Emotional Assessment Appearance:: Appears stated age Attitude/Demeanor/Rapport: Gracious, Engaged Affect (typically observed): Accepting, Appropriate Orientation: : Oriented to Self, Oriented to Place, Oriented to  Time   Psych Involvement: No (comment)  Admission diagnosis:  Hypokalemia [E87.6] Drug-induced constipation [K59.03] Neoplasm related pain [G89.3] Cancer associated pain [G89.3] Generalized weakness [R53.1] Constipation, unspecified constipation type [K59.00] Patient Active Problem List   Diagnosis Date Noted   Palliative care encounter 10/26/2023   Cancer associated pain 10/26/2023   Generalized weakness 10/25/2023   Constipation 10/19/2023   Metastasis to brain (HCC) 10/18/2023   Liver lesion 07/24/2023   S/P closure of ileostomy 07/07/2023   Hypotension 07/03/2023   Acute renal failure (ARF) (HCC) 07/02/2023   Acute URI 06/19/2023   Cough 05/15/2023   Acute bronchitis 05/15/2023    Ileostomy care (HCC) 05/03/2023   S/P partial colectomy 04/21/2023   Pain in the abdomen 02/06/2023   Genetic testing 01/24/2023   Abnormal PET scan of colon 12/07/2022   Rectal adenocarcinoma (HCC) 10/13/2022   Squamous cell lung cancer, right (HCC) 08/12/2022   Mixed hyperlipidemia 06/21/2022   Cramps, muscle, general 06/21/2022   Other fatigue 06/21/2022   Rib pain on right side 01/25/2022   Vitamin D  deficiency 01/25/2022   Thrush 01/25/2022   B12 deficiency 01/06/2022   Protein-calorie malnutrition, severe 09/07/2021   Dysphagia 09/05/2021   Prolonged QT interval 09/05/2021   Syncope, vasovagal 09/04/2021   Syncope 09/04/2021   Antineoplastic chemotherapy induced anemia 08/24/2021   Neutropenic fever (HCC)    HCAP (healthcare-associated pneumonia) 07/16/2021   Sepsis (HCC) 07/16/2021   Pancytopenia (HCC)    Port-A-Cath in place 07/07/2021   Anxiety associated with cancer diagnosis (HCC) 07/07/2021   Chemotherapy-induced neuropathy (HCC) 06/08/2021   Anxiety 06/08/2021   Hypomagnesemia 04/13/2021   Generalized body aches 03/11/2021   Neoplasm related pain 03/11/2021   Encounter for antineoplastic chemotherapy 03/11/2021   Encounter for monitoring cardiotoxic drug therapy 02/27/2021   Invasive ductal carcinoma of right breast (HCC) 02/20/2021   Goals of care, counseling/discussion 02/20/2021   Metabolic acidosis 02/22/2020   DKA (diabetic ketoacidosis) (HCC) 02/21/2020   Elevated LFTs 02/21/2020   AKI (acute kidney injury) (HCC) 02/21/2020   Leukocytosis 02/21/2020   Hyponatremia 01/30/2017   Hypochloremia 01/30/2017   Hypokalemia 01/27/2017   Hyperglycemia 01/27/2017   Back pain 12/15/2016   Chronic lower back pain 12/15/2016   Elevated ferritin 11/07/2016   Aortic atherosclerosis (HCC) 10/24/2016   Fatty liver 10/24/2016   Thrombocytopenia (HCC) 10/24/2016   Medication monitoring encounter 11/19/2015   GERD (gastroesophageal reflux disease) 11/19/2015    Tobacco abuse 11/19/2015   Essential hypertension, benign 11/19/2015   PCP:  Carin Gauze, NP Pharmacy:   [facility] [address]), [place] - [address] [place]) [place] [address] Phone: [phone] Fax: [phone]  [facility] [address] [place] [address] Phone: [phone] Fax: [phone]  [facility] [address] [place] [address] Phone: [phone] Fax: [phone]     Social Drivers of Health (SDOH) Social History: SDOH Screenings   Food Insecurity: No Food Insecurity (10/26/2023)  Housing: Low Risk  (10/26/2023)  Transportation Needs: No Transportation Needs (10/26/2023)  Utilities: Not At Risk (10/26/2023)  Alcohol  Screen: Low Risk  (01/06/2022)  Depression (PHQ2-9): Low Risk  (10/19/2023)  Financial Resource Strain: Low Risk  (01/06/2022)  Physical Activity: Inactive (01/06/2022)  Social Connections: Moderately Isolated (10/26/2023)  Stress: Stress Concern Present (01/06/2022)  Tobacco Use: High Risk (10/25/2023)   SDOH Interventions:     Readmission Risk Interventions    07/09/2023    4:37 PM 04/23/2023    1:26 PM  Readmission Risk Prevention Plan  Transportation Screening Complete Complete  PCP or Specialist Appt within 5-7 Days Complete Complete  Home Care Screening Complete Complete  Medication Review (RN CM) Complete Complete

## 2023-10-26 NOTE — ED Notes (Signed)
 Patient had large bowel movement. Cleaned and placed in dry undergarments/

## 2023-10-26 NOTE — Consult Note (Signed)
 Palliative Medicine [facility] at [facility] [phone] [phone]) [phone]   Name: Yvonne Scott Date: 10/26/2023 MRN: [id number]  DOB: [birth year]  Patient Care Team: Carin Gauze, NP as PCP - General (Cardiology) Babara Call, MD as Consulting Physician (Oncology)    REASON FOR CONSULTATION: Yvonne Scott is a 63 y.o. female with multiple medical problems including stage IV triple negative breast cancer widely metastatic to brain lung liver and bone, rectal adenocarcinoma status post resection and colostomy/reversal, and right lung SCC status post radiation.  Patient is on CarboTaxol chemo.  She was admitted the hospital on 10/25/2023 with visceral crisis.  Palliative care consulted to address goals and manage ongoing symptoms.  SOCIAL HISTORY:     reports that she has been smoking cigarettes. She has never used smokeless tobacco. She reports that she does not drink alcohol  and does not use drugs.  Patient lives at home.  She has a mother and step-father with whom she reportedly has a strained relationship.  She is estranged from her daughter.  Patient's siblings and significant other are deceased.  ADVANCE DIRECTIVES:  Does not have  CODE STATUS: DNR  PAST MEDICAL HISTORY: Past Medical History:  Diagnosis Date   Anxiety    Aortic atherosclerosis (HCC) 10/24/2016   Breast cancer (HCC)    colon cancer  and rectal   COPD (chronic obstructive pulmonary disease) (HCC)    Depression    Dyspnea    GERD (gastroesophageal reflux disease)    Hypokalemia    Hypomagnesemia    Invasive ductal carcinoma of right breast (HCC)    Metabolic acidosis    Personal history of chemotherapy    Pneumonia    Severe protein-calorie malnutrition (HCC)    Thrombocytopenia (HCC)     PAST SURGICAL HISTORY:  Past Surgical History:  Procedure Laterality Date   BREAST BIOPSY Right 2022   BRONCHIAL BIOPSY  12/29/2022   Procedure: BRONCHIAL BIOPSIES;   Surgeon: Isadora Hose, MD;  Location: [facility] ENDOSCOPY;  Service: Pulmonary;;   BRONCHIAL NEEDLE ASPIRATION BIOPSY  12/29/2022   Procedure: BRONCHIAL NEEDLE ASPIRATION BIOPSIES;  Surgeon: Isadora Hose, MD;  Location: [facility] ENDOSCOPY;  Service: Pulmonary;;   COLONOSCOPY WITH PROPOFOL  N/A 12/07/2022   Procedure: COLONOSCOPY WITH PROPOFOL ;  Surgeon: Unk Corinn Skiff, MD;  Location: [facility] ENDOSCOPY;  Service: Gastroenterology;  Laterality: N/A;   COLOSTOMY TAKEDOWN N/A 07/07/2023   Procedure: TAKEDOWN OF LOOP ILEOSTOMY;  Surgeon: Teresa Lonni HERO, MD;  Location: [facility] ORS;  Service: General;  Laterality: N/A;  TAKEDOWN OF LOOP ILESTOMY   FLEXIBLE SIGMOIDOSCOPY N/A 04/21/2023   Procedure: FLEXIBLE SIGMOIDOSCOPY, ICG PERFUSION;  Surgeon: Teresa Lonni HERO, MD;  Location: [facility] ORS;  Service: General;  Laterality: N/A;   FLEXIBLE SIGMOIDOSCOPY N/A 07/07/2023   Procedure: KINGSTON SIDE;  Surgeon: Teresa Lonni HERO, MD;  Location: [facility] ORS;  Service: General;  Laterality: N/A;  DIAGNOSTIC FLEXIBLE SIGMOIDOSCOPY   FOOT SURGERY Left    little toe correction   MASTECTOMY W/ SENTINEL NODE BIOPSY Right 10/19/2021   Procedure: MASTECTOMY WITH SENTINEL LYMPH NODE BIOPSY ( modified radical);  Surgeon: Rodolph Romano, MD;  Location: [facility] ORS;  Service: General;  Laterality: Right;   POLYPECTOMY  12/07/2022   Procedure: POLYPECTOMY;  Surgeon: Unk Corinn Skiff, MD;  Location: [facility] ENDOSCOPY;  Service: Gastroenterology;;   PORTA CATH INSERTION  01/19/2021   SUBMUCOSAL TATTOO INJECTION  12/07/2022   Procedure: SUBMUCOSAL TATTOO INJECTION;  Surgeon: Unk Corinn Skiff, MD;  Location: [facility] ENDOSCOPY;  Service:  Gastroenterology;;   VIDEO BRONCHOSCOPY WITH ENDOBRONCHIAL ULTRASOUND N/A 12/29/2022   Procedure: VIDEO BRONCHOSCOPY WITH ENDOBRONCHIAL ULTRASOUND;  Surgeon: Isadora Hose, MD;  Location: [facility]OS[facility];  Service: Pulmonary;  Laterality: N/A;   XI ROBOTIC ASSISTED LOWER ANTERIOR RESECTION  N/A 04/21/2023   Procedure: XI ROBOTIC ASSISTED LOWER ANTERIOR RESECTION, ILEOSTOMY CREATION;  Surgeon: Teresa Lonni HERO, MD;  Location: [facility];  Service: General;  Laterality: N/A;    HEMATOLOGY/ONCOLOGY HISTORY:  Oncology History  Invasive ductal carcinoma of right breast (HCC)  11/11/2020 Genetic Testing   Genetic testing done at [facility].  Per note, negative.   02/20/2021 Initial Diagnosis   Invasive ductal carcinoma of right breast (HCC)\  -August 2022, patient was diagnosed with right breast stage IIIb cT3 N0M0, grade 2, ER negative, PR negative HER2 negative breast cancer.  Patient self palpated breast mass many years ago.  10/21/2020 diagnostic mammogram and ultrasound confirmed presence of mass and a subsequent biopsy revealed right breast triple negative cancer. There was plan for patient to start with neoadjuvant chemotherapy followed by surgery and radiation.  Unfortunately patient never started treatment.    Med[facility] port was [facility] at [facility].  Established care with me on 02/19/2021    02/24/2021 Imaging   CT chest abdomen pelvis showed large right breast mass, 5.5 x 4.8 cm with small right axillary lymph node with variable degrees of enhancement potentially involved but not specific based on size.  This tracked down to retropectoral nodal stations largest lymph node along the lateral margin of the pectoralis major.  Tiny pulmonary nodules in the chest as discussed.  These are nonspecific but would consider short interval follow-up to assess for any changes.  No evidence of metastatic disease involving the abdomen or pelvis.  Aortic atherosclerosis.   02/25/2021 Echocardiogram   echocardiogram showed LVEF 65 to 70%.  Patient has been to chemotherapy class and understands antiemetics instructions   03/11/2021 - 08/26/2021 Chemotherapy   BREAST Pembrolizumab  (200) D1 + Carboplatin  (5) D1 + Paclitaxel  (80) D1,8,15 q21d X 4 cycles, followed by Pembrolizumab  (200) D1 + AC D1 q21d x 3 cycles.  4th cycle was omitted due to poor tolerance.     03/16/2021 Imaging   bone scan showed no evidence of osseous metastatic breast cancer, asymmetric osseous uptake on the right at L5-S1, corresponding to degenerative disc disease on recent CT.  Asymmetric soft tissue uptake in the right breast   07/13/2021 Echocardiogram   Echocardiogram showed LVEF 60-65%.  Grade 1 diastolic dysfunction.  Refer details to echocardiogram reports.   10/19/2021 Surgery   Patient underwent right modified radical mastectomy  Pathology showed invasive mammary carcinoma, no special type, multifocal, with focal chondroid stroma.  Clip and biopsy sites identified.  12 lymph nodes negative for malignancy.  2 lymph nodes displaying dense fibrous scarring, compatible with pathological complete response.  Benign nipple/areola.   Grade 3, LVI not identified, DCIS not identified.  All margins negative for invasive carcinoma.   ypT2 ypN0  Comment Due to the presence of dense fibrosis and treatment effect in two of the sampled lymph nodes, pancytokeratin stains were performed on all submitted lymph nodes.  All lymph nodes are negative for residual metastatic carcinoma.  A separate focus of invasive carcinoma, measuring approximately 11 mm in greatest extent, and histologically similar to the primary tumor, is identified within sampling of the fibrous tissue adjacent to the primary tumor.    10/19/2021 Cancer Staging   Staging form: Breast, AJCC 8th Edition - Pathologic stage from 10/19/2021:  No Stage Recommended (ypT2, pN0, cM0, G3, ER-, PR-, HER2-) - Signed by Babara Call, MD on 11/03/2021 Stage prefix: Post-therapy Response to neoadjuvant therapy: Partial response Multigene prognostic tests performed: None Histologic grading system: 3 grade system   05/27/2022 Imaging   CT chest abdomen pelvis with contrast showed 1. Interval right mastectomy with resolution of suspicious right axillary nodes. 2. Enlargement of a 5 mm right lower  lobe pulmonary nodule. Development of a 5 mm left upper lobe pulmonary nodule. Cannot exclude early metastatic disease. Consider chest CT follow-up at 3 months. 3. Otherwise, no evidence of metastatic disease in the chest, abdomen, or pelvis. 4. Age advanced coronary artery atherosclerosis. Recommend assessment of coronary risk factors. 5. Aortic atherosclerosis (ICD10-I70.0) and emphysema (ICD10-J43.9). 6. Mild enlargement of the common duct, which is now minimally dilated for age. Correlate with bilirubin levels. If elevated,consider ERCP.   09/09/2022 Imaging   CT chest without contrast showed 1. Interval growth of three solid bilateral pulmonary nodules since 05/25/2022 chest CT, largest 0.9 cm in the anteromedial left upper lobe, highly suspicious for pulmonary metastases. 2. No thoracic adenopathy. 3. Three-vessel coronary atherosclerosis. 4. Aortic Atherosclerosis (ICD10-I70.0) and Emphysema (ICD10-J43.9).     09/29/2022 Imaging   PET scan shows 1. Hypermetabolic bilateral pulmonary nodules, consistent with metastasis. 2. No other evidence of tracer avid metastatic disease, status post right mastectomy. 3. Hypermetabolism with suggestion of soft tissue fullness in the mid rectum, suspicious for polyp or carcinoma. Consider further evaluation with sigmoidoscopy. 4. Incidental findings, including: Aortic atherosclerosis (ICD10-I70.0), coronary artery atherosclerosis and emphysema (ICD10-J43.9).   08/01/2023 Imaging   CT chest abdomen pelvis with contrast  Chest:   1. Interval increase in multiple pulmonary nodules which are suspicious for pulmonary metastatic disease. The largest is in the medial left upper lobe and measures 2.2 cm. 2. Left hilar and mediastinal lymphadenopathy is present with the largest left hilar lymph node measuring 2.1 cm.   Abdomen and pelvis:   1. Progression of liver metastatic disease from July 01, 2023 and most notably from May 25, 2022. The  largest liver lesion is estimated at 2.1 cm in largest dimension.   08/11/2023 Imaging   PET scan showed Overall progression of disease.   As seen on the recent CT scan hypermetabolic lymph nodes the left side of the mediastinum, AP window and hilum. Additional smaller focus seen in the upper anterior mediastinum at the level of the manubrium.   Developing multiple hypermetabolic lung nodules including several which are juxtapleural including the dominant lesion medially along the left upper lobe anteriorly.   Development of multiple hypermetabolic liver lesions. At least 8 lesions are seen.   Focal nodular area of wall thickening along the anterior aspect of the antrum of the stomach with abnormal uptake. In addition there is adjacent mesenteric nodules just caudal to the antrum of the stomach which could represent peritoneal nodules versus abnormal lymph nodes.   Four hypermetabolic bone lesions consistent with osseous metastatic disease. Areas include right iliac bone above the acetabulum as well as along the spine at T11, T12 and L5.   Surgical changes. There is a small subcutaneous nodule at the level of the pelvis in the midline anteriorly with some minimal uptake. Attention on follow-up.     10/19/2023 -  Chemotherapy   Patient is on Treatment Plan : BREAST AC q21d / Carboplatin  D1 + Paclitaxel  D1,8,15 q21d     10/19/2023 Cancer Staging   Staging form: Breast, AJCC 8th Edition - Pathologic stage  from 10/19/2023: Stage IV (rpM1, ER+, PR-, HER2-) - Signed by Babara Call, MD on 10/19/2023 Stage prefix: Recurrence Multigene prognostic tests performed: None   Squamous cell lung cancer, right (HCC)  08/12/2022 Initial Diagnosis   Squamous cell lung cancer, right (HCC)   01/10/2023 Cancer Staging   Staging form: Lung, AJCC 8th Edition - Clinical: cT1, cN0, cM0 - Signed by Babara Call, MD on 01/10/2023 Stage prefix: Initial diagnosis   08/01/2023 Imaging   CT chest abdomen pelvis  with contrast  Chest:   1. Interval increase in multiple pulmonary nodules which are suspicious for pulmonary metastatic disease. The largest is in the medial left upper lobe and measures 2.2 cm. 2. Left hilar and mediastinal lymphadenopathy is present with the largest left hilar lymph node measuring 2.1 cm.   Abdomen and pelvis:   1. Progression of liver metastatic disease from July 01, 2023 and most notably from May 25, 2022. The largest liver lesion is estimated at 2.1 cm in largest dimension.   08/11/2023 Imaging   PET scan showed Overall progression of disease.   As seen on the recent CT scan hypermetabolic lymph nodes the left side of the mediastinum, AP window and hilum. Additional smaller focus seen in the upper anterior mediastinum at the level of the manubrium.   Developing multiple hypermetabolic lung nodules including several which are juxtapleural including the dominant lesion medially along the left upper lobe anteriorly.   Development of multiple hypermetabolic liver lesions. At least 8 lesions are seen.   Focal nodular area of wall thickening along the anterior aspect of the antrum of the stomach with abnormal uptake. In addition there is adjacent mesenteric nodules just caudal to the antrum of the stomach which could represent peritoneal nodules versus abnormal lymph nodes.   Four hypermetabolic bone lesions consistent with osseous metastatic disease. Areas include right iliac bone above the acetabulum as well as along the spine at T11, T12 and L5.   Surgical changes. There is a small subcutaneous nodule at the level of the pelvis in the midline anteriorly with some minimal uptake. Attention on follow-up.     Rectal adenocarcinoma (HCC)  10/13/2022 Initial Diagnosis   Rectal adenocarcinoma (HCC)  patient has large polyp and biopsy showed rectal adenocarcinoma-at least intramucosal. She was also recommended to have bronchoscopy ASAP for further  evaluation of the lung nodules.  Patient adamantly declined pulmonology workup concurrently with colonoscopy.  She eventually underwent pulmonology workup with bronchoscopy biopsy of right upper lobe nodule and the results showed squamous cell carcinoma.   lingula nodule was not biopsied due to location and threat for pneumothorax.  Denies hematochezia,  black tarry stool or abdominal pain, change of bowel habits.   01/16/2023 Imaging   MRI pelvis without contrast showed 4.1 cm upper/mid rectal mass, as above.   Rectal adenocarcinoma T stage: T2.   Rectal adenocarcinoma N stage: N0.   Distance from tumor to the internal anal sphincter is 7.0 cm.   04/21/2023 Surgery   Patient underwent distal sigmoid colon and rectum low anterior resection Pathology showed Tubulovillous adenoma with high-grade dysplasia/intramucosal carcinoma  (pTis)  Negative for invasion  Tumor measures 2.6 x 2.3 x 2.1 cm  Margins free of adenomatous change and carcinoma  Eight benign perirectal lymph nodes (0/8, pN0  Final distal margin benign: Negative for dysplasia and carcinoma.    05/15/2023 Cancer Staging   Staging form: Colon and Rectum, AJCC 8th Edition - Clinical stage from 05/15/2023: Stage 0 (cTis, cN0, cM0) -  Signed by Babara Call, MD on 05/15/2023 Stage prefix: Initial diagnosis Total positive nodes: 0   07/07/2023 Surgery   diverting loop ileostomy take down   08/01/2023 Imaging   CT chest abdomen pelvis with contrast  Chest:   1. Interval increase in multiple pulmonary nodules which are suspicious for pulmonary metastatic disease. The largest is in the medial left upper lobe and measures 2.2 cm. 2. Left hilar and mediastinal lymphadenopathy is present with the largest left hilar lymph node measuring 2.1 cm.   Abdomen and pelvis:   1. Progression of liver metastatic disease from July 01, 2023 and most notably from May 25, 2022. The largest liver lesion is estimated at 2.1 cm in largest  dimension.   08/11/2023 Imaging   PET scan showed Overall progression of disease.   As seen on the recent CT scan hypermetabolic lymph nodes the left side of the mediastinum, AP window and hilum. Additional smaller focus seen in the upper anterior mediastinum at the level of the manubrium.   Developing multiple hypermetabolic lung nodules including several which are juxtapleural including the dominant lesion medially along the left upper lobe anteriorly.   Development of multiple hypermetabolic liver lesions. At least 8 lesions are seen.   Focal nodular area of wall thickening along the anterior aspect of the antrum of the stomach with abnormal uptake. In addition there is adjacent mesenteric nodules just caudal to the antrum of the stomach which could represent peritoneal nodules versus abnormal lymph nodes.   Four hypermetabolic bone lesions consistent with osseous metastatic disease. Areas include right iliac bone above the acetabulum as well as along the spine at T11, T12 and L5.   Surgical changes. There is a small subcutaneous nodule at the level of the pelvis in the midline anteriorly with some minimal uptake. Attention on follow-up.       ALLERGIES:  has no known allergies.  MEDICATIONS:  Current Facility-Administered Medications  Medication Dose Route Frequency Provider Last Rate Last Admin   acetaminophen  (TYLENOL ) tablet 650 mg  650 mg Oral Q6H PRN Barbarann Nest, MD       Or   acetaminophen  (TYLENOL ) suppository 650 mg  650 mg Rectal Q6H PRN Barbarann Nest, MD       albuterol  (PROVENTIL ) (2.5 MG/3ML) 0.083% nebulizer solution 2.5 mg  2.5 mg Nebulization Q6H PRN Dail Rankin RAMAN, RPH       bisacodyl  (DULCOLAX) EC tablet 5 mg  5 mg Oral Daily PRN Barbarann Nest, MD   5 mg at 10/25/23 1523   dexamethasone  (DECADRON ) tablet 2 mg  2 mg Oral BID WC Barbarann Nest, MD   2 mg at 10/26/23 1016   enoxaparin  (LOVENOX ) injection 40 mg  40 mg Subcutaneous Q24H Barbarann Nest, MD   40 mg at 10/25/23 2351   fluticasone  furoate-vilanterol (BREO ELLIPTA ) 200-25 MCG/ACT 1 puff  1 puff Inhalation Daily Barbarann Nest, MD       hydrALAZINE  (APRESOLINE ) injection 5 mg  5 mg Intravenous Q4H PRN Barbarann Nest, MD       magnesium  oxide (MAG-OX) tablet 800 mg  800 mg Oral Daily Barbarann Nest, MD   800 mg at 10/26/23 1015   morphine  (MS CONTIN ) 12 hr tablet 15 mg  15 mg Oral Q8H Yu, Zhou, MD   15 mg at 10/26/23 1336   naloxone  (NARCAN ) nasal spray 4 mg/0.1 mL  0.4 mg Nasal Once PRN Barbarann Nest, MD       nicotine  (NICODERM CQ  - dosed in mg/24  hours) patch 14 mg  14 mg Transdermal Daily Barbarann Nest, MD   14 mg at 10/26/23 1016   ondansetron  (ZOFRAN ) injection 4 mg  4 mg Intravenous Q6H PRN Barbarann Nest, MD       oxyCODONE  (Oxy IR/ROXICODONE ) immediate release tablet 5 mg  5 mg Oral Q4H PRN Barbarann Nest, MD   5 mg at 10/26/23 1015   pantoprazole  (PROTONIX ) EC tablet 40 mg  40 mg Oral Daily Barbarann Nest, MD   40 mg at 10/26/23 1015   polyethylene glycol (MIRALAX  / GLYCOLAX ) packet 17 g  17 g Oral Daily PRN Barbarann Nest, MD       prochlorperazine  (COMPAZINE ) tablet 10 mg  10 mg Oral Q6H PRN Barbarann Nest, MD       senna-docusate (Senokot-S) tablet 2 tablet  2 tablet Oral Daily Babara Call, MD   2 tablet at 10/25/23 2348   sodium chloride  flush (NS) 0.9 % injection 3 mL  3 mL Intravenous Q12H Yates, Jennifer, MD   3 mL at 10/25/23 2355   Current Outpatient Medications  Medication Sig Dispense Refill   budesonide -formoterol  (SYMBICORT ) 160-4.5 MCG/ACT inhaler Inhale 2 puffs into the lungs 2 (two) times daily. 1 each 12   dexamethasone  (DECADRON ) 1 MG tablet Take 2 tablets (2 mg total) by mouth 2 (two) times daily with a meal. 60 tablet 0   docusate sodium  (COLACE) 100 MG capsule Take 1 capsule (100 mg total) by mouth 2 (two) times daily. 60 capsule 1   fluticasone  (FLONASE ) 50 MCG/ACT nasal spray Place 1 spray into both nostrils daily as needed for  rhinitis or allergies.     lidocaine -prilocaine  (EMLA ) cream Apply 1 Application topically as needed. 30 g 5   magnesium  oxide (MAG-OX) 400 (240 Mg) MG tablet Take 800 mg by mouth daily.     morphine  (MS CONTIN ) 15 MG 12 hr tablet Take 1 tablet (15 mg total) by mouth every 12 (twelve) hours. 30 tablet 0   naloxone  (NARCAN ) nasal spray 4 mg/0.1 mL Place 0.4 mg into the nose once as needed (as directed).     omeprazole  (PRILOSEC) 40 MG capsule Take 1 capsule (40 mg total) by mouth daily. 90 capsule 0   ondansetron  (ZOFRAN ) 8 MG tablet Take 1 tablet (8 mg total) by mouth every 8 (eight) hours as needed for nausea or vomiting. 60 tablet 0   oxyCODONE  (OXY IR/ROXICODONE ) 5 MG immediate release tablet Take 1 tablet (5 mg total) by mouth every 6 (six) hours as needed for severe pain (pain score 7-10) or breakthrough pain. 60 tablet 0   potassium chloride  SA (KLOR-CON  M) 20 MEQ tablet Take 1 tablet (20 mEq total) by mouth daily. 3 tablet 0   prochlorperazine  (COMPAZINE ) 10 MG tablet Take 1 tablet (10 mg total) by mouth every 6 (six) hours as needed for nausea or vomiting. 60 tablet 0   VENTOLIN  HFA 108 (90 Base) MCG/ACT inhaler INHALE 1 PUFF BY MOUTH EVERY 6 HOURS AS NEEDED FOR SHORTNESS OF BREATH FOR WHEEZING (Patient taking differently: Inhale 1-2 puffs into the lungs every 6 (six) hours as needed for shortness of breath or wheezing.) 18 g 0   Facility-Administered Medications Ordered in Other Encounters  Medication Dose Route Frequency Provider Last Rate Last Admin   famotidine  (PEPCID ) 20-0.9 MG/50ML-% IVPB             VITAL SIGNS: BP 104/71   Pulse 85   Temp 97.9 F (36.6 C) (Oral)   Resp 19  Ht 5' 2 (1.575 m)   Wt 103 lb (46.7 kg)   LMP 11/19/2002 (Approximate)   SpO2 95%   BMI 18.84 kg/m  Filed Weights   10/25/23 9367  Weight: 103 lb (46.7 kg)    Estimated body mass index is 18.84 kg/m as calculated from the following:   Height as of this encounter: 5' 2 (1.575 m).   Weight as  of this encounter: 103 lb (46.7 kg).  LABS: CBC:    Component Value Date/Time   WBC 4.9 10/26/2023 0536   HGB 11.0 (L) 10/26/2023 0536   HGB 11.3 (L) 10/19/2023 0841   HGB 13.9 06/17/2022 1349   HCT 34.1 (L) 10/26/2023 0536   HCT 41.6 06/17/2022 1349   PLT 191 10/26/2023 0536   PLT 480 (H) 10/19/2023 0841   PLT 272 06/17/2022 1349   MCV 81.8 10/26/2023 0536   MCV 91 06/17/2022 1349   MCV 100 02/17/2014 0323   NEUTROABS 4.6 10/25/2023 0637   NEUTROABS 6.3 06/17/2022 1349   NEUTROABS 5.9 02/17/2014 0323   LYMPHSABS 0.9 10/25/2023 0637   LYMPHSABS 1.8 06/17/2022 1349   LYMPHSABS 3.2 02/17/2014 0323   MONOABS 0.1 10/25/2023 0637   MONOABS 0.4 02/17/2014 0323   EOSABS 0.0 10/25/2023 0637   EOSABS 0.0 06/17/2022 1349   EOSABS 0.0 02/17/2014 0323   BASOSABS 0.0 10/25/2023 0637   BASOSABS 0.0 06/17/2022 1349   BASOSABS 0.1 02/17/2014 0323   Comprehensive Metabolic Panel:    Component Value Date/Time   NA 136 10/26/2023 0536   NA 135 06/17/2022 1349   NA 134 (L) 02/17/2014 0323   K 3.4 (L) 10/26/2023 0536   K 3.3 (L) 02/17/2014 1113   CL 103 10/26/2023 0536   CL 100 02/17/2014 0323   CO2 20 (L) 10/26/2023 0536   CO2 26 02/17/2014 0323   BUN 23 10/26/2023 0536   BUN 12 06/17/2022 1349   BUN 6 (L) 02/17/2014 0323   CREATININE 0.55 10/26/2023 0536   CREATININE 0.83 10/19/2023 0841   CREATININE 0.77 01/27/2017 1418   GLUCOSE 66 (L) 10/26/2023 0536   GLUCOSE 108 (H) 02/17/2014 0323   CALCIUM  8.4 (L) 10/26/2023 0536   CALCIUM  7.9 (L) 02/17/2014 0323   AST 30 10/26/2023 0536   AST 22 10/19/2023 0841   ALT 39 10/26/2023 0536   ALT 13 10/19/2023 0841   ALT 36 02/17/2014 0323   ALKPHOS 255 (H) 10/26/2023 0536   ALKPHOS 47 02/17/2014 0323   BILITOT 0.8 10/26/2023 0536   BILITOT 0.4 10/19/2023 0841   PROT 6.4 (L) 10/26/2023 0536   PROT 6.7 06/17/2022 1349   PROT 5.9 (L) 02/17/2014 0323   ALBUMIN  3.0 (L) 10/26/2023 0536   ALBUMIN  4.5 06/17/2022 1349   ALBUMIN  3.0 (L)  02/17/2014 0323    RADIOGRAPHIC STUDIES: CT CHEST ABDOMEN PELVIS WO CONTRAST Result Date: 10/25/2023 CLINICAL DATA:  Metastatic disease evaluation.  Right breast cancer. EXAM: CT CHEST, ABDOMEN AND PELVIS WITHOUT CONTRAST TECHNIQUE: Multidetector CT imaging of the chest, abdomen and pelvis was performed following the standard protocol without IV contrast. RADIATION DOSE REDUCTION: This exam was performed according to the departmental dose-optimization program which includes automated exposure control, adjustment of the mA and/or kV according to patient size and/or use of iterative reconstruction technique. COMPARISON:  PET CT 08/11/2023. CT chest abdomen and pelvis 07/31/2023. FINDINGS: CT CHEST FINDINGS Cardiovascular: Heart and aorta are normal in size. There is a trace pericardial effusion similar to prior. There are atherosclerotic calcifications of the aorta.  Left chest port catheter tip ends in the distal SVC. Mediastinum/Nodes: Esophagus and visualized thyroid  gland are within normal limits. There is a new enlarged lymph nodes in the upper prevascular region/superior mediastinum measuring 2.8 x 1.9 cm image 2/15. Left hilar lymphadenopathy has increased with largest lymph node now measuring 2.0 by 2.6 cm. Lungs/Pleura: Mild emphysema again noted. Right pleural base nodules measuring up to 10 mm appear unchanged in the right lower lung. Cavitary nodule in the right upper lobe has slightly decreased in size now measuring 7 mm (previously 11 mm). There is a new medial left upper lobe mass invading the anterior mediastinum and right hilum measuring 6.5 by 2.7 cm image 4/56. There is new occlusion of left upper lobe bronchi with adjacent nodularity likely related to to direct spread of tumor or lymphangitic spread. Left lower lobe pulmonary nodule measuring 10 mm previously measured 8 mm. There some secretions seen within the trachea and bilateral mainstem bronchi extending into left lower lobe bronchi. There  is a new trace left pleural effusion. Musculoskeletal: Mild T11 compression deformity is new from prior. Right mastectomy changes are present. CT ABDOMEN PELVIS FINDINGS Hepatobiliary: The metastatic lesions throughout the liver have increased in size and number. The largest now measures 3 cm. Gallbladder and bile ducts are within normal limits. Pancreas: Unremarkable. No pancreatic ductal dilatation or surrounding inflammatory changes. Spleen: Normal in size without focal abnormality. Adrenals/Urinary Tract: There are new bilateral adrenal gland nodules measuring 14 mm on the left and 14 mm on the right. The kidneys and bladder are within normal limits. Stomach/Bowel: Stomach is within normal limits. Appendix appears normal. No evidence of bowel wall thickening, distention, or inflammatory changes. Vascular/Lymphatic: Aortic atherosclerosis. There are new enlarged portacaval and gastrohepatic lymph nodes measuring up to 14 mm. Reproductive: Uterus and bilateral adnexa are unremarkable. Other: There is trace ascites, unchanged. There are new scattered peritoneal nodules with the largest in the anterior abdomen measuring up to 15 mm. Nodules are also seen in the right lower quadrant. There is a new nodule in the right abdominal wall measuring 1.5 x 1.7 cm image 2/71. There is a new subcutaneous nodule in the anterior left abdominal wall measuring up to 11 mm. There is a new subcutaneous nodule in the right flank measuring 10 mm. Musculoskeletal: No acute or significant osseous findings. IMPRESSION: 1. New medial left upper lobe mass invading the anterior mediastinum and right hilum worrisome for metastatic disease. 2. New occlusion of left upper lobe bronchi with adjacent nodularity likely related to direct spread of tumor or lymphangitic spread. 3. New enlarged prevascular lymph node in the superior mediastinum worrisome for metastatic disease. 4. Increased size of left hilar lymphadenopathy. 5. Increased size of  bilateral pulmonary nodules. 6. Increased size and number of hepatic metastatic lesions. 7. New bilateral adrenal gland nodules worrisome for metastatic disease. 8. New peritoneal nodules worrisome for metastatic disease. 9. New subcutaneous nodules in the abdominal wall and right flank worrisome for metastatic disease. 10. New trace left pleural effusion. 11. New mild T11 compression deformity. Aortic Atherosclerosis (ICD10-I70.0) and Emphysema (ICD10-J43.9). Electronically Signed   By: Greig Pique M.D.   On: 10/25/2023 17:27   DG Chest Port 1 View Result Date: 10/25/2023 CLINICAL DATA:  Shortness of breath. EXAM: PORTABLE CHEST 1 VIEW COMPARISON:  PET-CT dated 08/11/2023. FINDINGS: The heart size and mediastinal contours are unchanged. Stable left chest Port-A-Cath tip overlies the superior cavoatrial junction. Aortic atherosclerosis. Known hypermetabolic lung nodules and lymph nodes are better evaluated  on the prior PET-CT dated 08/11/2023. No focal consolidation, pleural effusion, or pneumothorax. No acute osseous abnormality. IMPRESSION: 1. No acute cardiopulmonary findings. 2. Known hypermetabolic lung nodules and lymph nodes are better evaluated on the prior PET-CT dated 08/11/2023. Electronically Signed   By: Harrietta Sherry M.D.   On: 10/25/2023 08:59   MR Brain W Wo Contrast Result Date: 10/16/2023 CLINICAL DATA:  History of breast cancer. Suspicion of metastatic disease. History of right parietal region meningioma. EXAM: MRI HEAD WITHOUT AND WITH CONTRAST TECHNIQUE: Multiplanar, multiecho pulse sequences of the brain and surrounding structures were obtained without and with intravenous contrast. CONTRAST:  4mL GADAVIST  GADOBUTROL  1 MMOL/ML IV SOLN COMPARISON:  PET scan 08/11/2023.  Brain MRI 02/24/2020. FINDINGS: Brain: No diffusion imaging was performed. There is a 3 mm metastasis within the left pons. Second 3 mm metastasis in the right side of the pons. Centrally necrotic 7 mm metastasis in the  left cerebellum without edema or mass effect. Suspicion of a second 3-4 mm metastasis in the inferior left cerebellum, though this could possibly be a venous angioma. 4 mm metastasis in the right frontal lobe axial image 106. Redemonstration of a chronic benign meningioma at the left posterior parietal vertex measuring 8 mm in size without mass-effect upon the brain. 3 mm metastasis in the left frontal lobe adjacent to the frontal horn of the lateral ventricle, axial image 92. Questionable second 3 mm metastasis in a deep sulcus of the left posterior frontal lobe, axial image 105. 3 mm metastasis at the left parieto-occipital junction, axial image 99. There is a background pattern of moderate chronic small-vessel ischemic change of the hemispheric white matter. Because these metastatic lesions are somewhat low in conspicuity, consider a 3 tesla exam for better characterization, particularly if brain irradiation is being considered. No hydrocephalus.  No extra-axial collection. Vascular: Major vessels at the base of the brain show flow. Skull and upper cervical spine: No calvarial or skull base metastasis is identified. Sinuses/Orbits: Clear/normal Other: None IMPRESSION: 1. Two small metastases in the pons. 1 or 2 metastases in the left cerebellum. One metastasis in the right frontal lobe. Three metastases in the left cerebral hemisphere. These are small lesions, the largest being in the left cerebellum with central necrosis. None of the lesions show mass effect or vasogenic edema. See impression 4. 2. Background pattern of moderate chronic small-vessel ischemic change of the hemispheric white matter. 3. Stable 8 mm meningioma at the left posterior parietal vertex without mass-effect upon the brain. 4. Because these metastatic lesions are somewhat low in conspicuity, consider a 3 tesla exam for better characterization, particularly if stereotactic brain irradiation is being considered. Electronically Signed   By:  Oneil Officer M.D.   On: 10/16/2023 16:38    PERFORMANCE STATUS (ECOG) : 3 - Symptomatic, >50% confined to bed  Review of Systems Unless otherwise noted, a complete review of systems is negative.  Physical Exam General: Thin, frail-appearing Pulmonary: Unlabored Extremities: no edema, no joint deformities Skin: no rashes Neurological: Weakness but otherwise nonfocal  IMPRESSION: Patient seen in the ED.  She is a complicated history with stage IV triple negative breast cancer widely metastatic to brain, lung, liver and bone.  Patient is status post cycle 1 day 1 palliative carbo Taxol  chemotherapy on 10/19/2023.  She is now admitted 10/25/2023 with visceral crisis.  CT of the chest abdomen and pelvis concerning for disease progression with new left upper lobe mass with occlusion to the left upper lobe bronchi concerning  for possible lymphangitic spread.  Patient has increased size of bilateral pulmonary nodules and hepatic metastasis as well as new bilateral adrenal mets and peritoneal nodules.  Patient verbalized understanding that her cancer is progressing and will likely be the cause of her end-of-life.  However, she states I am not done fighting.  It is unclear if her clinical status will stabilize to allow additional cancer treatments but that is clearly her hope.  Her prognosis is poor.  Patient has a complicated social situation.  She was living at home and was cared for by her mother and step-father but has strained relationship with her parents.  She describes her mother as having a history of addicted to drugs.  Patient also has a daughter from whom she says she is estranged due to daughter's heroin addiction.  Patient had a significant other who is now deceased.  She says that her brother died recently.  She describes not having friends or social support.  We discussed future decision making, as patient does not have advance directives.  Patient was rather emphatic that she would not  want her parents to be involved in decision making.  I encouraged her to complete a healthcare power of attorney.  She thinks that she would want her former significant other's sister, Slater Amy, to serve in that role.  She says that she has discussed with Slater her wishes for end-of-life care and funeral arrangements.  Patient asked for me to try to search for a number and call Slater. I was able to locate a number [phone]) and spoke with her by phone.  I updated Slater regarding patient's clinical status.  Slater plans to come to the hospital and meet with patient regarding goals.  I spoke with patient regarding her wishes for end-of-life care.  She states specifically she would not want to be resuscitated nor have her life prolonged artificially on machines.  Also relayed this decision to [place].  Symptomatically, patient has had generalized pain.  Seems to be improved after increasing frequency of MS Contin  to every 8 hour dosing.  Oxycodone  as needed for breakthrough pain.  Can further liberalize opioid regimen as needed.  Aggressive bowel regimen to prevent  PLAN: - Continue current scope of treatment - MS Contin /oxycodone  - Daily bowel regimen - Chaplain consult to complete ACP documents - DNR/DNI  Case and plan discussed with Dr. Barbarann and Dr. Babara  Time Total: 60 minutes  Visit consisted of counseling and education dealing with the complex and emotionally intense issues of symptom management and palliative care in the setting of serious and potentially life-threatening illness.Greater than 50%  of this time was spent counseling and coordinating care related to the above assessment and plan.  Signed by: Fonda Mower, PhD, NP-C

## 2023-10-26 NOTE — Plan of Care (Signed)

## 2023-10-26 NOTE — ED Notes (Signed)
 CCMD called to initiate cardiac monitoring.

## 2023-10-26 NOTE — Progress Notes (Signed)
 Progress Note   Patient: Yvonne Scott FMW:[id number] DOB: 03-May-1960 DOA: 10/25/2023     0 DOS: the patient was seen and examined on 10/26/2023   Brief hospital course: TIONDRA FANG is a 63 y.o. female with medical history significant of invasive ductal R breast CA s/p Carboplatin /Taxol /Keytruda /mastectomy with recurrence and metastatic disease, rectal adenocarcinoma s/p resection and colostomy reversal, R lung SCC s/p radiation, depression, and COPD who presented on 8/13 with generalized weakness.  She reports progressive weakness since chemo last week, was unable to get up from the floor when she fell.  She says that she wants to keep fighting against the cancer, but also reports needing better pain control, her mother isn't able to effectively care for her at home, she isn't sure what she will do.  K+ 2.5, otherwise symptoms appear to be related to ongoing progressive metastatic cancer.  Oncology and palliative care consulted.  Assessment and Plan:  Generalized weakness with fall in the setting of metastatic cancer, hypokalemia Patient with weakness following chemo last week Progressive and resulting in a fall on day of admission She was unable to get up and her mother was unable to help Marked hypokalemia (K+ 2.5) may be contributing but this has been corrected and she remains quite weak More likely, the progressive cancer and chemotherapy effects are more likely the primary etiology Will admit to med surg (no longer needs tele since hypoK+ is corrected)  Hypokalemia Replete as needed Likely nutritional   Breast/rectal/lung cancer with brain mets Saw Dr. Babara on 8/7 Resumed Carboplatin  and Taxol  x 1 dose She was also referred to radiation and is planned for CT stimulation this week and then 10 fractions of radiation; I have reached out to Dr. Lenn to see if he wants to initiate this while she is hospitalized Continue dexamethasone  Repeat CT C/A/P with new medial LUL mass  invading the anterior mediastinum and R hilum concerning for metastatic disease; new occlusion of LUL bronchi with adjacent nodularity from tumor or lymphangitic spread; new enlarged prevascular LN in superior mediastinum; increased L hilar LAD; increased B pulmonary nodules; increased size and number of hepatic mets; new B adrenal gland nodules; new peritoneal nodules - essentially diffuse metastatic disease Oncology consulted, poor prognosis with progressive and incurable malignancy but she continues to desire further treatments Palliative care is also consulting She is now DNR   Abdominal pain Concern for constipation by EDP Given enema and finally had a good BM  Has h/o colostomy and rectal adenoCA Has some baseline rectal incontinence   Chronic pain Currently on MS Contin  15 mg TID + Oxy 5mg  q4h prn; will continue Also on dexamethasone  Palliative care consulted for assistance with pain control   COPD with tobacco dependence Cessation encouraged Nicotine  patch provided Continue Symbicort  (Breo per formulary) and Albuterol  prn      Consultants: Oncology Palliative care Nutrition PT OT TOC team  Procedures: None  Antibiotics: None      Subjective: Feeling sad, really wants to fight the cancer but is torn about what her next steps will look like.  Physical Exam: Vitals:   10/26/23 0157 10/26/23 0331 10/26/23 0526 10/26/23 0906  BP:  109/67 117/71 115/70  Pulse:  74 76 97  Resp:  14 18 17   Temp: (!) 97.5 F (36.4 C)  97.7 F (36.5 C)   TempSrc: Oral  Oral   SpO2:  95% 97% 95%  Weight:      Height:       Intake/Output  Summary (Last 24 hours) at 10/26/2023 0908 Last data filed at 10/25/2023 1218 Gross per 24 hour  Intake 1200 ml  Output --  Net 1200 ml   Filed Weights   10/25/23 9367  Weight: 46.7 kg    Exam:  General:  Appears frail, chronically ill, wearing cap over alopecic head Eyes:   normal lids, iris ENT:  grossly normal hearing, lips &  tongue, mmm; poor dentition Cardiovascular:  RRR.   Respiratory:   CTA bilaterally with no wheezes/rales/rhonchi.  Normal respiratory effort. Abdomen:  soft, diffusely tender, ND Skin:  no rash or induration seen on limited exam Musculoskeletal:  grossly normal tone BUE/BLE, good ROM, no bony abnormality Psychiatric:  blunted mood and affect, speech fluent and appropriate, AOx3 Neurologic:  CN 2-12 grossly intact, moves all extremities in coordinated fashion  Data Reviewed: I have reviewed the patient's lab results since admission.  Pertinent labs for today include:   K+ 3.4 Glucose 66 AP 255 Albumin  3.0 WBC 4.9 Hgb 11    Family Communication: None present  Disposition: Status is: Inpatient Admit - It is my clinical opinion that admission to INPATIENT is reasonable and necessary because of the expectation that this patient will require hospital care that crosses at least 2 midnights to treat this condition based on the medical complexity of the problems presented.  Given the aforementioned information, the predictability of an adverse outcome is felt to be significant.     Planned Discharge Destination: Skilled nursing facility    Time spent: 50 minutes  Author: Delon Herald, MD 10/26/2023 9:07 AM  For on call review [email or web address].

## 2023-10-26 NOTE — ED Notes (Signed)
 Pt had large BM. Pericare and linen change performed

## 2023-10-26 NOTE — Evaluation (Signed)
 Physical Therapy Evaluation Patient Details Name: Yvonne Scott MRN: [id number] DOB: [birth year] Today's Date: 10/26/2023  History of Present Illness  63 y.o. female with medical history significant of invasive ductal R breast CA s/p Carboplatin /Taxol /Keytruda /mastectomy with recurrence and metastatic disease, rectal adenocarcinoma s/p resection and colostomy reversal, R lung SCC s/p radiation, depression, and COPD who presented on 8/13 with generalized weakness.  She reports progressive weakness since chemo last week, was unable to get up from the floor when she fell  Clinical Impression  Pt was willing to work with PT, however she did tend to get distracted and needed consistent redirection to stay on task.  Pt needed only light assist with bed mobility and was able to rise and do some limited ambulation w/o heavy assist, however she is much weaker and more limited than her baseline (OOH daily to help both parents, etc).  Pt with multiple bouts of coughing up phlegm, vitals remained stable with activity.  Pt will benefit from continued PT to address functional issues and insure safe d/c planning.       If plan is discharge home, recommend the following: A little help with walking and/or transfers;A little help with bathing/dressing/bathroom;Assistance with cooking/housework;Assist for transportation;Help with stairs or ramp for entrance   Can travel by private vehicle   Yes    Equipment Recommendations None recommended by PT  Recommendations for Other Services       Functional Status Assessment Patient has had a recent decline in their functional status and demonstrates the ability to make significant improvements in function in a reasonable and predictable amount of time.     Precautions / Restrictions Precautions Precautions: Fall Recall of Precautions/Restrictions: Intact Restrictions Weight Bearing Restrictions Per Provider Order: Yes      Mobility  Bed Mobility Overal bed  mobility: Needs Assistance Bed Mobility: Supine to Sit, Sit to Supine     Supine to sit: Min assist Sit to supine: Min assist   General bed mobility comments: Pt showed good effort, needed only minimal assist with LEs to get to/from EOB    Transfers Overall transfer level: Modified independent Equipment used: Rolling walker (2 wheels)               General transfer comment: close CGA to insure safety, but pt able to rise to standing w/o direct assist    Ambulation/Gait Ambulation/Gait assistance: Contact guard assist Gait Distance (Feet): 25 Feet Assistive device: Rolling walker (2 wheels)         General Gait Details: Pt was able to do some in room ambulation with walker (no AD at baseline).  She had no LOBs but fatigued relatively quickly.  Stairs            Wheelchair Mobility     Tilt Bed    Modified Rankin (Stroke Patients Only)       Balance Overall balance assessment: Needs assistance Sitting-balance support: No upper extremity supported Sitting balance-Leahy Scale: Good     Standing balance support: Bilateral upper extremity supported Standing balance-Leahy Scale: Fair                               Pertinent Vitals/Pain Pain Assessment Pain Assessment: 0-10 Pain Score: 8  Pain Location: reports general chronic pain: back, neck, chest    Home Living Family/patient expects to be discharged to:: Unsure Living Arrangements: Parent  Home Equipment: Agricultural consultant (2 wheels);Rollator (4 wheels);Cane - single point Additional Comments: Pt reports she stays with her mother at night (apartment with no steps) but daily helps her father who has 8 steps to enter    Prior Function Prior Level of Function : Independent/Modified Independent             Mobility Comments: Pt reports she drives, runs errands, helps parents but is more recently struggling with this ADLs Comments: reports independent      Extremity/Trunk Assessment   Upper Extremity Assessment Upper Extremity Assessment: Generalized weakness;Overall [facility] for tasks assessed    Lower Extremity Assessment Lower Extremity Assessment: Generalized weakness;Overall WFL for tasks assessed       Communication   Communication Communication: No apparent difficulties    Cognition Arousal: Alert Behavior During Therapy: Anxious   PT - Cognitive impairments: No apparent impairments                       PT - Cognition Comments: pt with some impulsive, labile and at times tangential interactions but generally shows good awareness Following commands: Intact       Cueing Cueing Techniques: Verbal cues     General Comments      Exercises     Assessment/Plan    PT Assessment Patient needs continued PT services  PT Problem List Decreased strength;Decreased range of motion;Decreased activity tolerance;Decreased balance;Decreased mobility;Decreased cognition;Decreased knowledge of use of DME;Decreased safety awareness;Pain       PT Treatment Interventions DME instruction;Gait training;Stair training;Functional mobility training;Therapeutic activities;Therapeutic exercise;Neuromuscular re-education;Balance training;Patient/family education;Cognitive remediation    PT Goals (Current goals can be found in the Care Plan section)  Acute Rehab PT Goals Patient Stated Goal: pt reports she'd like to be able to help her parents PT Goal Formulation: With patient Time For Goal Achievement: 11/08/23 Potential to Achieve Goals: Fair    Frequency Min 2X/week     Co-evaluation               AM-PAC PT 6 Clicks Mobility  Outcome Measure Help needed turning from your back to your side while in a flat bed without using bedrails?: A Little Help needed moving from lying on your back to sitting on the side of a flat bed without using bedrails?: A Little Help needed moving to and from a bed to a chair (including a  wheelchair)?: A Little Help needed standing up from a chair using your arms (e.g., wheelchair or bedside chair)?: A Little Help needed to walk in hospital room?: A Little Help needed climbing 3-5 steps with a railing? : A Lot 6 Click Score: 17    End of Session Equipment Utilized During Treatment: Gait belt Activity Tolerance: Patient tolerated treatment well Patient left: in bed;with call bell/phone within reach;with nursing/sitter in room Nurse Communication: Mobility status PT Visit Diagnosis: Muscle weakness (generalized) (M62.81);Difficulty in walking, not elsewhere classified (R26.2);Unsteadiness on feet (R26.81)    Time: 9079-9051 PT Time Calculation (min) (ACUTE ONLY): 28 min   Charges:   PT Evaluation $PT Eval Low Complexity: 1 Low   PT General Charges $$ ACUTE PT VISIT: 1 Visit         Carmin JONELLE Deed, DPT 10/26/2023, 11:19 AM

## 2023-10-26 NOTE — Progress Notes (Signed)
   10/26/23 1545  Spiritual Encounters  Type of Visit Initial  Care provided to: Pt and family  Reason for visit Advance directives  OnCall Visit Yes   Chaplain visited patient per a [place] Consult in the EPIC system.  Chaplain provided AD paperwork to patient and shared that she'd received notice that the patient needed the documents.  Patient didn't appear to have requested the documents and was very overwhelmed with the amount of paperwork she'd already received.  Chaplain said she could take the AD documents home and look them over with family and she did not have to complete them while here.  Patient understood and appeared relieved.  Chaplain shared that [employer] is available 24/7 if she has any other spiritual care needs.    Rev. Rana M. Nicholaus, M.Div.  Chaplain Resident  [facility]

## 2023-10-26 NOTE — Progress Notes (Signed)
 Patient arrived to floor on stretcher by NT, alert and oriented. I.V intact. No distress noted. Call bell and belonging at bedside.

## 2023-10-26 NOTE — Significant Event (Addendum)
       CROSS COVER NOTE  NAME: Yvonne Scott MRN: [id number] DOB : [birth year] ATTENDING PHYSICIAN: Barbarann Nest, MD    Date of Service   10/26/2023   HPI/Events of Note   Notified by RN patient red MEWS  Yvonne Scott has pmh invasive ductal R breast CA s/p Carboplatin /Taxol /Keytruda /mastectomy with recurrence and metastatic disease, rectal adenocarcinoma s/p resection and colostomy reversal, R lung SCC s/p radiation, depression, and COPD who presented on 8/13 with generalized weakness and fall. Lab work revealed significant hypokalemia  Interventions   Assessment/Plan:    10/26/2023    8:33 PM 10/26/2023    8:16 PM 10/26/2023    4:26 PM  Vitals with BMI  Systolic 91 95 156  Diastolic 59 65 88  Pulse 136 134 92   TEMP                       98.3  Recent increase in ms contin  dosing and likely poor oral intake related to pain   250 ml saline bolus x 3 Midodrine  10 mg x1 f/b 10 mg TID with meals        Erminio LITTIE Cone NP [facility] Cross Cover 7pm-7am - check amion for availability Pager [phone]

## 2023-10-26 NOTE — NC FL2 (Signed)
 [place]  MEDICAID FL2 LEVEL OF CARE FORM     IDENTIFICATION  Patient Name: Yvonne Scott Birthdate: Jun 25, 1960 Sex: female Admission Date (Current Location): 10/25/2023  [place] and [place] Number:  Chiropodist and Address:  [facility], [address], [place], [place] [address]      Provider Number: [id number]  Attending Physician Name and Address:  Barbarann Nest, MD  Relative Name and Phone Number:  TRUDY ALBEE (Mother)  [phone] [facility] Phone)    Current Level of Care: Hospital Recommended Level of Care: Skilled Nursing Facility Prior Approval Number:    Date Approved/Denied:   PASRR Number: [id number]  Discharge Plan: SNF    Current Diagnoses: Patient Active Problem List   Diagnosis Date Noted   Palliative care encounter 10/26/2023   Cancer associated pain 10/26/2023   Generalized weakness 10/25/2023   Constipation 10/19/2023   Metastasis to brain (HCC) 10/18/2023   Liver lesion 07/24/2023   S/P closure of ileostomy 07/07/2023   Hypotension 07/03/2023   Acute renal failure (ARF) (HCC) 07/02/2023   Acute URI 06/19/2023   Cough 05/15/2023   Acute bronchitis 05/15/2023   Ileostomy care (HCC) 05/03/2023   S/P partial colectomy 04/21/2023   Pain in the abdomen 02/06/2023   Genetic testing 01/24/2023   Abnormal PET scan of colon 12/07/2022   Rectal adenocarcinoma (HCC) 10/13/2022   Squamous cell lung cancer, right (HCC) 08/12/2022   Mixed hyperlipidemia 06/21/2022   Cramps, muscle, general 06/21/2022   Other fatigue 06/21/2022   Rib pain on right side 01/25/2022   Vitamin D  deficiency 01/25/2022   Thrush 01/25/2022   B12 deficiency 01/06/2022   Protein-calorie malnutrition, severe 09/07/2021   Dysphagia 09/05/2021   Prolonged QT interval 09/05/2021   Syncope, vasovagal 09/04/2021   Syncope 09/04/2021   Antineoplastic chemotherapy induced anemia 08/24/2021   Neutropenic fever (HCC)    HCAP  (healthcare-associated pneumonia) 07/16/2021   Sepsis (HCC) 07/16/2021   Pancytopenia (HCC)    Port-A-Cath in place 07/07/2021   Anxiety associated with cancer diagnosis (HCC) 07/07/2021   Chemotherapy-induced neuropathy (HCC) 06/08/2021   Anxiety 06/08/2021   Hypomagnesemia 04/13/2021   Generalized body aches 03/11/2021   Neoplasm related pain 03/11/2021   Encounter for antineoplastic chemotherapy 03/11/2021   Encounter for monitoring cardiotoxic drug therapy 02/27/2021   Invasive ductal carcinoma of right breast (HCC) 02/20/2021   Goals of care, counseling/discussion 02/20/2021   Metabolic acidosis 02/22/2020   DKA (diabetic ketoacidosis) (HCC) 02/21/2020   Elevated LFTs 02/21/2020   AKI (acute kidney injury) (HCC) 02/21/2020   Leukocytosis 02/21/2020   Hyponatremia 01/30/2017   Hypochloremia 01/30/2017   Hypokalemia 01/27/2017   Hyperglycemia 01/27/2017   Back pain 12/15/2016   Chronic lower back pain 12/15/2016   Elevated ferritin 11/07/2016   Aortic atherosclerosis (HCC) 10/24/2016   Fatty liver 10/24/2016   Thrombocytopenia (HCC) 10/24/2016   Medication monitoring encounter 11/19/2015   GERD (gastroesophageal reflux disease) 11/19/2015   Tobacco abuse 11/19/2015   Essential hypertension, benign 11/19/2015    Orientation RESPIRATION BLADDER Height & Weight     Self, Time, Situation, Place  Normal Incontinent (wears diapers) Weight: 101 lb (45.8 kg) Height:  5' 2 (157.5 cm)  BEHAVIORAL SYMPTOMS/MOOD NEUROLOGICAL BOWEL NUTRITION STATUS      Incontinent (wears diapers) Diet  AMBULATORY STATUS COMMUNICATION OF NEEDS Skin   Limited Assist Verbally Normal                       Personal Care Assistance Level  of Assistance  Bathing, Feeding, Dressing Bathing Assistance: Limited assistance Feeding assistance: Limited assistance Dressing Assistance: Limited assistance     Functional Limitations Info  Sight, Hearing, Speech Sight Info: Adequate Hearing Info:  Adequate Speech Info: Adequate    SPECIAL CARE FACTORS FREQUENCY  PT (By licensed PT), OT (By licensed OT)     PT Frequency: 5x a week OT Frequency: 3x a week            Contractures Contractures Info: Present    Additional Factors Info  Code Status, Allergies Code Status Info: Do not attempt resuscitation (DNR) -DNR-LIMITED -Do Not Intubate/DNI Allergies Info: No Known Allergies           Current Medications (10/26/2023):  This is the current hospital active medication list Current Facility-Administered Medications  Medication Dose Route Frequency Provider Last Rate Last Admin   acetaminophen  (TYLENOL ) tablet 650 mg  650 mg Oral Q6H PRN Barbarann Nest, MD       Or   acetaminophen  (TYLENOL ) suppository 650 mg  650 mg Rectal Q6H PRN Barbarann Nest, MD       albuterol  (PROVENTIL ) (2.5 MG/3ML) 0.083% nebulizer solution 2.5 mg  2.5 mg Nebulization Q6H PRN Belue, Rankin RAMAN, RPH       bisacodyl  (DULCOLAX) EC tablet 5 mg  5 mg Oral Daily PRN Barbarann Nest, MD   5 mg at 10/25/23 1523   dexamethasone  (DECADRON ) tablet 2 mg  2 mg Oral BID WC Barbarann Nest, MD   2 mg at 10/26/23 1639   enoxaparin  (LOVENOX ) injection 40 mg  40 mg Subcutaneous Q24H Barbarann Nest, MD   40 mg at 10/25/23 2351   fluticasone  furoate-vilanterol (BREO ELLIPTA ) 200-25 MCG/ACT 1 puff  1 puff Inhalation Daily Barbarann Nest, MD       hydrALAZINE  (APRESOLINE ) injection 5 mg  5 mg Intravenous Q4H PRN Barbarann Nest, MD       magnesium  oxide (MAG-OX) tablet 800 mg  800 mg Oral Daily Barbarann Nest, MD   800 mg at 10/26/23 1015   morphine  (MS CONTIN ) 12 hr tablet 15 mg  15 mg Oral Q8H Yu, Zhou, MD   15 mg at 10/26/23 1336   naloxone  (NARCAN ) nasal spray 4 mg/0.1 mL  0.4 mg Nasal Once PRN Barbarann Nest, MD       nicotine  (NICODERM CQ  - dosed in mg/24 hours) patch 14 mg  14 mg Transdermal Daily Barbarann Nest, MD   14 mg at 10/26/23 1016   ondansetron  (ZOFRAN ) injection 4 mg  4 mg Intravenous Q6H PRN Barbarann Nest, MD       oxyCODONE  (Oxy IR/ROXICODONE ) immediate release tablet 5 mg  5 mg Oral Q4H PRN Barbarann Nest, MD   5 mg at 10/26/23 1639   pantoprazole  (PROTONIX ) EC tablet 40 mg  40 mg Oral Daily Barbarann Nest, MD   40 mg at 10/26/23 1015   polyethylene glycol (MIRALAX  / GLYCOLAX ) packet 17 g  17 g Oral Daily PRN Barbarann Nest, MD       prochlorperazine  (COMPAZINE ) tablet 10 mg  10 mg Oral Q6H PRN Barbarann Nest, MD       senna-docusate (Senokot-S) tablet 2 tablet  2 tablet Oral Daily Babara Call, MD   2 tablet at 10/25/23 2348   sodium chloride  flush (NS) 0.9 % injection 3 mL  3 mL Intravenous Q12H Yates, Jennifer, MD   3 mL at 10/25/23 2355   Facility-Administered Medications Ordered in Other Encounters  Medication Dose Route Frequency Provider Last Rate  Last Admin   famotidine  (PEPCID ) 20-0.9 MG/50ML-% IVPB              Discharge Medications: Please see discharge summary for a list of discharge medications.  Relevant Imaging Results:  Relevant Lab Results:   Additional Information SS# [id number] DOB: 1961-01-29  [name], LCSW

## 2023-10-27 ENCOUNTER — Encounter: Payer: Self-pay | Admitting: Oncology

## 2023-10-27 DIAGNOSIS — C7931 Secondary malignant neoplasm of brain: Secondary | ICD-10-CM | POA: Diagnosis not present

## 2023-10-27 DIAGNOSIS — C50911 Malignant neoplasm of unspecified site of right female breast: Secondary | ICD-10-CM | POA: Diagnosis not present

## 2023-10-27 DIAGNOSIS — Z515 Encounter for palliative care: Secondary | ICD-10-CM | POA: Diagnosis not present

## 2023-10-27 DIAGNOSIS — R531 Weakness: Secondary | ICD-10-CM | POA: Diagnosis not present

## 2023-10-27 DIAGNOSIS — G893 Neoplasm related pain (acute) (chronic): Secondary | ICD-10-CM | POA: Diagnosis not present

## 2023-10-27 LAB — BASIC METABOLIC PANEL WITH GFR
Anion gap: 13 (ref 5–15)
BUN: 27 mg/dL — ABNORMAL HIGH (ref 8–23)
CO2: 17 mmol/L — ABNORMAL LOW (ref 22–32)
Calcium: 7.4 mg/dL — ABNORMAL LOW (ref 8.9–10.3)
Chloride: 101 mmol/L (ref 98–111)
Creatinine, Ser: 1.25 mg/dL — ABNORMAL HIGH (ref 0.44–1.00)
GFR, Estimated: 48 mL/min — ABNORMAL LOW (ref >60)
Glucose, Bld: 88 mg/dL (ref 70–99)
Potassium: 3.2 mmol/L — ABNORMAL LOW (ref 3.5–5.1)
Sodium: 131 mmol/L — ABNORMAL LOW (ref 135–145)

## 2023-10-27 LAB — CBC
HCT: 29.4 % — ABNORMAL LOW (ref 36.0–46.0)
Hemoglobin: 9.7 g/dL — ABNORMAL LOW (ref 12.0–15.0)
MCH: 26.4 pg (ref 26.0–34.0)
MCHC: 33 g/dL (ref 30.0–36.0)
MCV: 80.1 fL (ref 80.0–100.0)
Platelets: 101 K/uL — ABNORMAL LOW (ref 150–400)
RBC: 3.67 MIL/uL — ABNORMAL LOW (ref 3.87–5.11)
RDW: 14.6 % (ref 11.5–15.5)
WBC: 0.8 K/uL — CL (ref 4.0–10.5)
nRBC: 0 % (ref 0.0–0.2)

## 2023-10-27 MED ORDER — CHLORHEXIDINE GLUCONATE CLOTH 2 % EX PADS
6.0000 | MEDICATED_PAD | Freq: Every day | CUTANEOUS | Status: DC
  Administered 2023-10-27: 6 via TOPICAL

## 2023-10-27 MED ORDER — LORAZEPAM 2 MG/ML PO CONC
1.0000 mg | ORAL | Status: DC | PRN
Start: 2023-10-27 — End: 2023-10-28

## 2023-10-27 MED ORDER — LORAZEPAM 1 MG PO TABS
1.0000 mg | ORAL_TABLET | ORAL | Status: DC | PRN
  Administered 2023-10-27: 1 mg via ORAL
  Filled 2023-10-27: qty 1

## 2023-10-27 MED ORDER — GLYCOPYRROLATE 1 MG PO TABS: 1.0000 mg | ORAL_TABLET | ORAL | Status: DC | PRN

## 2023-10-27 MED ORDER — GLYCOPYRROLATE 0.2 MG/ML IJ SOLN
0.2000 mg | INTRAMUSCULAR | Status: DC | PRN
  Administered 2023-10-28: 0.2 mg via INTRAVENOUS
  Filled 2023-10-27: qty 1

## 2023-10-27 MED ORDER — HALOPERIDOL LACTATE 5 MG/ML IJ SOLN: 0.5000 mg | INTRAMUSCULAR | Status: DC | PRN

## 2023-10-27 MED ORDER — LORAZEPAM 2 MG/ML IJ SOLN
1.0000 mg | INTRAMUSCULAR | Status: DC | PRN
  Administered 2023-10-27: 1 mg via INTRAVENOUS
  Filled 2023-10-27 (×2): qty 0.5

## 2023-10-27 MED ORDER — BIOTENE DRY MOUTH MT LIQD: 15.0000 mL | OROMUCOSAL | Status: DC | PRN

## 2023-10-27 MED ORDER — POLYVINYL ALCOHOL 1.4 % OP SOLN: 1.0000 [drp] | Freq: Four times a day (QID) | OPHTHALMIC | Status: DC | PRN

## 2023-10-27 MED ORDER — SODIUM CHLORIDE 0.9 % IV BOLUS
250.0000 mL | Freq: Once | INTRAVENOUS | Status: AC
  Administered 2023-10-27: 250 mL via INTRAVENOUS

## 2023-10-27 MED ORDER — GLYCOPYRROLATE 0.2 MG/ML IJ SOLN
0.2000 mg | INTRAMUSCULAR | Status: DC | PRN
Start: 2023-10-27 — End: 2023-10-28

## 2023-10-27 MED ORDER — HYDROMORPHONE BOLUS VIA INFUSION
0.2500 mg | INTRAVENOUS | Status: DC | PRN
  Administered 2023-10-27: 1 mg via INTRAVENOUS
  Administered 2023-10-27 (×2): 0.5 mg via INTRAVENOUS
  Administered 2023-10-27: 1 mg via INTRAVENOUS

## 2023-10-27 MED ORDER — HALOPERIDOL 0.5 MG PO TABS: 0.5000 mg | ORAL_TABLET | ORAL | Status: DC | PRN

## 2023-10-27 MED ORDER — HALOPERIDOL LACTATE 2 MG/ML PO CONC: 0.5000 mg | ORAL | Status: DC | PRN

## 2023-10-27 MED ORDER — DIPHENHYDRAMINE HCL 50 MG/ML IJ SOLN: 12.5000 mg | INTRAMUSCULAR | Status: DC | PRN

## 2023-10-27 MED ORDER — MIDODRINE HCL 5 MG PO TABS
10.0000 mg | ORAL_TABLET | Freq: Three times a day (TID) | ORAL | Status: DC
  Administered 2023-10-27: 10 mg via ORAL
  Filled 2023-10-27: qty 2

## 2023-10-27 MED ORDER — MIDODRINE HCL 5 MG PO TABS
10.0000 mg | ORAL_TABLET | Freq: Once | ORAL | Status: AC
  Administered 2023-10-27: 10 mg via ORAL
  Filled 2023-10-27: qty 2

## 2023-10-27 MED ORDER — HYDROMORPHONE HCL-NACL 50-0.9 MG/50ML-% IV SOLN
0.5000 mg/h | INTRAVENOUS | Status: DC
  Administered 2023-10-27: 2 mg/h via INTRAVENOUS
  Administered 2023-10-27: 1.5 mg/h via INTRAVENOUS
  Administered 2023-10-27: 1 mg/h via INTRAVENOUS
  Filled 2023-10-27: qty 50

## 2023-10-27 NOTE — Plan of Care (Signed)

## 2023-10-27 NOTE — Telephone Encounter (Signed)
 Testing completed. Copy placed in HIM bin for scanning and copy given to provider.

## 2023-10-27 NOTE — Hospital Course (Addendum)
 63 y.o. female with h/o invasive ductal R breast CA s/p Carboplatin /Taxol /Keytruda /mastectomy with recurrence and metastatic disease, rectal adenocarcinoma s/p resection and colostomy reversal, R lung SCC s/p radiation, depression, and COPD who presented on 8/13 with generalized weakness.  She reported progressive weakness since chemo last week, was unable to get up from the floor when she fell.  She wanted to keep fighting against the cancer, but decompensated and eventually agreed that comfort care was appropriate.  Oncology and palliative care consulted. She died peacefully within 24 hours of transitioning to comfort care.

## 2023-10-27 NOTE — Plan of Care (Signed)

## 2023-10-27 NOTE — Progress Notes (Signed)
 Palliative Medicine [facility] at [facility] [phone] [phone]   Name: Yvonne Scott Date: 10/27/2023 MRN: [id number]  DOB: November 27, 1960  Patient Care Team: Carin Gauze, NP as PCP - General (Cardiology) Babara Call, MD as Consulting Physician (Oncology)    REASON FOR CONSULTATION: Yvonne Scott is a 63 y.o. female with multiple medical problems including stage IV triple negative breast cancer widely metastatic to brain lung liver and bone, rectal adenocarcinoma status post resection and colostomy/reversal, and right lung SCC status post radiation.  Patient is on CarboTaxol chemo.  She was admitted the hospital on 10/25/2023 with visceral crisis.  Palliative care consulted to address goals and manage ongoing symptoms.    CODE STATUS: DNR  PAST MEDICAL HISTORY: Past Medical History:  Diagnosis Date   Anxiety    Aortic atherosclerosis (HCC) 10/24/2016   Breast cancer (HCC)    colon cancer  and rectal   COPD (chronic obstructive pulmonary disease) (HCC)    Depression    Dyspnea    GERD (gastroesophageal reflux disease)    Hypokalemia    Hypomagnesemia    Invasive ductal carcinoma of right breast (HCC)    Metabolic acidosis    Personal history of chemotherapy    Pneumonia    Severe protein-calorie malnutrition (HCC)    Thrombocytopenia (HCC)     PAST SURGICAL HISTORY:  Past Surgical History:  Procedure Laterality Date   BREAST BIOPSY Right 2022   BRONCHIAL BIOPSY  12/29/2022   Procedure: BRONCHIAL BIOPSIES;  Surgeon: Isadora Hose, MD;  Location: [facility];  Service: Pulmonary;;   BRONCHIAL NEEDLE ASPIRATION BIOPSY  12/29/2022   Procedure: BRONCHIAL NEEDLE ASPIRATION BIOPSIES;  Surgeon: Isadora Hose, MD;  Location: [facility];  Service: Pulmonary;;   COLONOSCOPY WITH PROPOFOL  N/A 12/07/2022   Procedure: COLONOSCOPY WITH PROPOFOL ;  Surgeon: Unk Corinn Skiff, MD;  Location: [facility] ENDOSCOPY;  Service:  Gastroenterology;  Laterality: N/A;   COLOSTOMY TAKEDOWN N/A 07/07/2023   Procedure: TAKEDOWN OF LOOP ILEOSTOMY;  Surgeon: Teresa Lonni HERO, MD;  Location: [facility];  Service: General;  Laterality: N/A;  TAKEDOWN OF LOOP ILESTOMY   FLEXIBLE SIGMOIDOSCOPY N/A 04/21/2023   Procedure: FLEXIBLE SIGMOIDOSCOPY, ICG PERFUSION;  Surgeon: Teresa Lonni HERO, MD;  Location: [facility];  Service: General;  Laterality: N/A;   FLEXIBLE SIGMOIDOSCOPY N/A 07/07/2023   Procedure: KINGSTON SIDE;  Surgeon: Teresa Lonni HERO, MD;  Location: [facility];  Service: General;  Laterality: N/A;  DIAGNOSTIC FLEXIBLE SIGMOIDOSCOPY   FOOT SURGERY Left    little toe correction   MASTECTOMY W/ SENTINEL NODE BIOPSY Right 10/19/2021   Procedure: MASTECTOMY WITH SENTINEL LYMPH NODE BIOPSY ( modified radical);  Surgeon: Rodolph Romano, MD;  Location: [facility] ORS;  Service: General;  Laterality: Right;   POLYPECTOMY  12/07/2022   Procedure: POLYPECTOMY;  Surgeon: Unk Corinn Skiff, MD;  Location: [facility] ENDOSCOPY;  Service: Gastroenterology;;   PORTA CATH INSERTION  01/19/2021   SUBMUCOSAL TATTOO INJECTION  12/07/2022   Procedure: SUBMUCOSAL TATTOO INJECTION;  Surgeon: Unk Corinn Skiff, MD;  Location: [facility] ENDOSCOPY;  Service: Gastroenterology;;   VIDEO BRONCHOSCOPY WITH ENDOBRONCHIAL ULTRASOUND N/A 12/29/2022   Procedure: VIDEO BRONCHOSCOPY WITH ENDOBRONCHIAL ULTRASOUND;  Surgeon: Isadora Hose, MD;  Location: [facility];  Service: Pulmonary;  Laterality: N/A;   XI ROBOTIC ASSISTED LOWER ANTERIOR RESECTION N/A 04/21/2023   Procedure: XI ROBOTIC ASSISTED LOWER ANTERIOR RESECTION, ILEOSTOMY CREATION;  Surgeon: Teresa Lonni HERO, MD;  Location: [facility];  Service: General;  Laterality: N/A;  HEMATOLOGY/ONCOLOGY HISTORY:  Oncology History  Invasive ductal carcinoma of right breast (HCC)  11/11/2020 Genetic Testing   Genetic testing done at [facility].  Per note, negative.   02/20/2021 Initial Diagnosis    Invasive ductal carcinoma of right breast (HCC)\  -August 2022, patient was diagnosed with right breast stage IIIb cT3 N0M0, grade 2, ER negative, PR negative HER2 negative breast cancer.  Patient self palpated breast mass many years ago.  10/21/2020 diagnostic mammogram and ultrasound confirmed presence of mass and a subsequent biopsy revealed right breast triple negative cancer. There was plan for patient to start with neoadjuvant chemotherapy followed by surgery and radiation.  Unfortunately patient never started treatment.    Medi port was placed at [facility].  Established care with me on 02/19/2021    02/24/2021 Imaging   CT chest abdomen pelvis showed large right breast mass, 5.5 x 4.8 cm with small right axillary lymph node with variable degrees of enhancement potentially involved but not specific based on size.  This tracked down to retropectoral nodal stations largest lymph node along the lateral margin of the pectoralis major.  Tiny pulmonary nodules in the chest as discussed.  These are nonspecific but would consider short interval follow-up to assess for any changes.  No evidence of metastatic disease involving the abdomen or pelvis.  Aortic atherosclerosis.   02/25/2021 Echocardiogram   echocardiogram showed LVEF 65 to 70%.  Patient has been to chemotherapy class and understands antiemetics instructions   03/11/2021 - 08/26/2021 Chemotherapy   BREAST Pembrolizumab  (200) D1 + Carboplatin  (5) D1 + Paclitaxel  (80) D1,8,15 q21d X 4 cycles, followed by Pembrolizumab  (200) D1 + AC D1 q21d x 3 cycles. 4th cycle was omitted due to poor tolerance.     03/16/2021 Imaging   bone scan showed no evidence of osseous metastatic breast cancer, asymmetric osseous uptake on the right at L5-S1, corresponding to degenerative disc disease on recent CT.  Asymmetric soft tissue uptake in the right breast   07/13/2021 Echocardiogram   Echocardiogram showed LVEF 60-65%.  Grade 1 diastolic dysfunction.  Refer details  to echocardiogram reports.   10/19/2021 Surgery   Patient underwent right modified radical mastectomy  Pathology showed invasive mammary carcinoma, no special type, multifocal, with focal chondroid stroma.  Clip and biopsy sites identified.  12 lymph nodes negative for malignancy.  2 lymph nodes displaying dense fibrous scarring, compatible with pathological complete response.  Benign nipple/areola.   Grade 3, LVI not identified, DCIS not identified.  All margins negative for invasive carcinoma.   ypT2 ypN0  Comment Due to the presence of dense fibrosis and treatment effect in two of the sampled lymph nodes, pancytokeratin stains were performed on all submitted lymph nodes.  All lymph nodes are negative for residual metastatic carcinoma.  A separate focus of invasive carcinoma, measuring approximately 11 mm in greatest extent, and histologically similar to the primary tumor, is identified within sampling of the fibrous tissue adjacent to the primary tumor.    10/19/2021 Cancer Staging   Staging form: Breast, AJCC 8th Edition - Pathologic stage from 10/19/2021: No Stage Recommended (ypT2, pN0, cM0, G3, ER-, PR-, HER2-) - Signed by Babara Call, MD on 11/03/2021 Stage prefix: Post-therapy Response to neoadjuvant therapy: Partial response Multigene prognostic tests performed: None Histologic grading system: 3 grade system   05/27/2022 Imaging   CT chest abdomen pelvis with contrast showed 1. Interval right mastectomy with resolution of suspicious right axillary nodes. 2. Enlargement of a 5 mm right lower lobe pulmonary nodule.  Development of a 5 mm left upper lobe pulmonary nodule. Cannot exclude early metastatic disease. Consider chest CT follow-up at 3 months. 3. Otherwise, no evidence of metastatic disease in the chest, abdomen, or pelvis. 4. Age advanced coronary artery atherosclerosis. Recommend assessment of coronary risk factors. 5. Aortic atherosclerosis (ICD10-I70.0) and emphysema  (ICD10-J43.9). 6. Mild enlargement of the common duct, which is now minimally dilated for age. Correlate with bilirubin levels. If elevated,consider ERCP.   09/09/2022 Imaging   CT chest without contrast showed 1. Interval growth of three solid bilateral pulmonary nodules since 05/25/2022 chest CT, largest 0.9 cm in the anteromedial left upper lobe, highly suspicious for pulmonary metastases. 2. No thoracic adenopathy. 3. Three-vessel coronary atherosclerosis. 4. Aortic Atherosclerosis (ICD10-I70.0) and Emphysema (ICD10-J43.9).     09/29/2022 Imaging   PET scan shows 1. Hypermetabolic bilateral pulmonary nodules, consistent with metastasis. 2. No other evidence of tracer avid metastatic disease, status post right mastectomy. 3. Hypermetabolism with suggestion of soft tissue fullness in the mid rectum, suspicious for polyp or carcinoma. Consider further evaluation with sigmoidoscopy. 4. Incidental findings, including: Aortic atherosclerosis (ICD10-I70.0), coronary artery atherosclerosis and emphysema (ICD10-J43.9).   08/01/2023 Imaging   CT chest abdomen pelvis with contrast  Chest:   1. Interval increase in multiple pulmonary nodules which are suspicious for pulmonary metastatic disease. The largest is in the medial left upper lobe and measures 2.2 cm. 2. Left hilar and mediastinal lymphadenopathy is present with the largest left hilar lymph node measuring 2.1 cm.   Abdomen and pelvis:   1. Progression of liver metastatic disease from July 01, 2023 and most notably from May 25, 2022. The largest liver lesion is estimated at 2.1 cm in largest dimension.   08/11/2023 Imaging   PET scan showed Overall progression of disease.   As seen on the recent CT scan hypermetabolic lymph nodes the left side of the mediastinum, AP window and hilum. Additional smaller focus seen in the upper anterior mediastinum at the level of the manubrium.   Developing multiple hypermetabolic lung  nodules including several which are juxtapleural including the dominant lesion medially along the left upper lobe anteriorly.   Development of multiple hypermetabolic liver lesions. At least 8 lesions are seen.   Focal nodular area of wall thickening along the anterior aspect of the antrum of the stomach with abnormal uptake. In addition there is adjacent mesenteric nodules just caudal to the antrum of the stomach which could represent peritoneal nodules versus abnormal lymph nodes.   Four hypermetabolic bone lesions consistent with osseous metastatic disease. Areas include right iliac bone above the acetabulum as well as along the spine at T11, T12 and L5.   Surgical changes. There is a small subcutaneous nodule at the level of the pelvis in the midline anteriorly with some minimal uptake. Attention on follow-up.     10/19/2023 -  Chemotherapy   Patient is on Treatment Plan : BREAST AC q21d / Carboplatin  D1 + Paclitaxel  D1,8,15 q21d     10/19/2023 Cancer Staging   Staging form: Breast, AJCC 8th Edition - Pathologic stage from 10/19/2023: Stage IV (rpM1, ER+, PR-, HER2-) - Signed by Babara Call, MD on 10/19/2023 Stage prefix: Recurrence Multigene prognostic tests performed: None   Squamous cell lung cancer, right (HCC)  08/12/2022 Initial Diagnosis   Squamous cell lung cancer, right (HCC)   01/10/2023 Cancer Staging   Staging form: Lung, AJCC 8th Edition - Clinical: cT1, cN0, cM0 - Signed by Babara Call, MD on 01/10/2023 Stage prefix: Initial  diagnosis   08/01/2023 Imaging   CT chest abdomen pelvis with contrast  Chest:   1. Interval increase in multiple pulmonary nodules which are suspicious for pulmonary metastatic disease. The largest is in the medial left upper lobe and measures 2.2 cm. 2. Left hilar and mediastinal lymphadenopathy is present with the largest left hilar lymph node measuring 2.1 cm.   Abdomen and pelvis:   1. Progression of liver metastatic disease from July 01, 2023 and most notably from May 25, 2022. The largest liver lesion is estimated at 2.1 cm in largest dimension.   08/11/2023 Imaging   PET scan showed Overall progression of disease.   As seen on the recent CT scan hypermetabolic lymph nodes the left side of the mediastinum, AP window and hilum. Additional smaller focus seen in the upper anterior mediastinum at the level of the manubrium.   Developing multiple hypermetabolic lung nodules including several which are juxtapleural including the dominant lesion medially along the left upper lobe anteriorly.   Development of multiple hypermetabolic liver lesions. At least 8 lesions are seen.   Focal nodular area of wall thickening along the anterior aspect of the antrum of the stomach with abnormal uptake. In addition there is adjacent mesenteric nodules just caudal to the antrum of the stomach which could represent peritoneal nodules versus abnormal lymph nodes.   Four hypermetabolic bone lesions consistent with osseous metastatic disease. Areas include right iliac bone above the acetabulum as well as along the spine at T11, T12 and L5.   Surgical changes. There is a small subcutaneous nodule at the level of the pelvis in the midline anteriorly with some minimal uptake. Attention on follow-up.     Rectal adenocarcinoma (HCC)  10/13/2022 Initial Diagnosis   Rectal adenocarcinoma (HCC)  patient has large polyp and biopsy showed rectal adenocarcinoma-at least intramucosal. She was also recommended to have bronchoscopy ASAP for further evaluation of the lung nodules.  Patient adamantly declined pulmonology workup concurrently with colonoscopy.  She eventually underwent pulmonology workup with bronchoscopy biopsy of right upper lobe nodule and the results showed squamous cell carcinoma.   lingula nodule was not biopsied due to location and threat for pneumothorax.  Denies hematochezia,  black tarry stool or abdominal pain, change  of bowel habits.   01/16/2023 Imaging   MRI pelvis without contrast showed 4.1 cm upper/mid rectal mass, as above.   Rectal adenocarcinoma T stage: T2.   Rectal adenocarcinoma N stage: N0.   Distance from tumor to the internal anal sphincter is 7.0 cm.   04/21/2023 Surgery   Patient underwent distal sigmoid colon and rectum low anterior resection Pathology showed Tubulovillous adenoma with high-grade dysplasia/intramucosal carcinoma  (pTis)  Negative for invasion  Tumor measures 2.6 x 2.3 x 2.1 cm  Margins free of adenomatous change and carcinoma  Eight benign perirectal lymph nodes (0/8, pN0  Final distal margin benign: Negative for dysplasia and carcinoma.    05/15/2023 Cancer Staging   Staging form: Colon and Rectum, AJCC 8th Edition - Clinical stage from 05/15/2023: Stage 0 (cTis, cN0, cM0) - Signed by Babara Call, MD on 05/15/2023 Stage prefix: Initial diagnosis Total positive nodes: 0   07/07/2023 Surgery   diverting loop ileostomy take down   08/01/2023 Imaging   CT chest abdomen pelvis with contrast  Chest:   1. Interval increase in multiple pulmonary nodules which are suspicious for pulmonary metastatic disease. The largest is in the medial left upper lobe and measures 2.2 cm. 2. Left hilar and  mediastinal lymphadenopathy is present with the largest left hilar lymph node measuring 2.1 cm.   Abdomen and pelvis:   1. Progression of liver metastatic disease from July 01, 2023 and most notably from May 25, 2022. The largest liver lesion is estimated at 2.1 cm in largest dimension.   08/11/2023 Imaging   PET scan showed Overall progression of disease.   As seen on the recent CT scan hypermetabolic lymph nodes the left side of the mediastinum, AP window and hilum. Additional smaller focus seen in the upper anterior mediastinum at the level of the manubrium.   Developing multiple hypermetabolic lung nodules including several which are juxtapleural including the  dominant lesion medially along the left upper lobe anteriorly.   Development of multiple hypermetabolic liver lesions. At least 8 lesions are seen.   Focal nodular area of wall thickening along the anterior aspect of the antrum of the stomach with abnormal uptake. In addition there is adjacent mesenteric nodules just caudal to the antrum of the stomach which could represent peritoneal nodules versus abnormal lymph nodes.   Four hypermetabolic bone lesions consistent with osseous metastatic disease. Areas include right iliac bone above the acetabulum as well as along the spine at T11, T12 and L5.   Surgical changes. There is a small subcutaneous nodule at the level of the pelvis in the midline anteriorly with some minimal uptake. Attention on follow-up.       ALLERGIES:  has no known allergies.  MEDICATIONS:  Current Facility-Administered Medications  Medication Dose Route Frequency Provider Last Rate Last Admin   acetaminophen  (TYLENOL ) tablet 650 mg  650 mg Oral Q6H PRN Barbarann Nest, MD       Or   acetaminophen  (TYLENOL ) suppository 650 mg  650 mg Rectal Q6H PRN Barbarann Nest, MD       albuterol  (PROVENTIL ) (2.5 MG/3ML) 0.083% nebulizer solution 2.5 mg  2.5 mg Nebulization Q6H PRN Dail Rankin RAMAN, RPH   2.5 mg at 10/27/23 0758   antiseptic oral rinse (BIOTENE) solution 15 mL  15 mL Topical PRN Barbarann Nest, MD       artificial tears ophthalmic solution 1 drop  1 drop Both Eyes QID PRN Barbarann Nest, MD       bisacodyl  (DULCOLAX) EC tablet 5 mg  5 mg Oral Daily PRN Barbarann Nest, MD   5 mg at 10/25/23 1523   Chlorhexidine  Gluconate Cloth 2 % PADS 6 each  6 each Topical Daily Barbarann Nest, MD   6 each at 10/27/23 0801   diphenhydrAMINE  (BENADRYL ) injection 12.5 mg  12.5 mg Intravenous Q4H PRN Barbarann Nest, MD       glycopyrrolate  (ROBINUL ) tablet 1 mg  1 mg Oral Q4H PRN Barbarann Nest, MD       Or   glycopyrrolate  (ROBINUL ) injection 0.2 mg  0.2 mg  Subcutaneous Q4H PRN Barbarann Nest, MD       Or   glycopyrrolate  (ROBINUL ) injection 0.2 mg  0.2 mg Intravenous Q4H PRN Barbarann Nest, MD       haloperidol  (HALDOL ) tablet 0.5 mg  0.5 mg Oral Q4H PRN Barbarann Nest, MD       Or   haloperidol  (HALDOL ) 2 MG/ML solution 0.5 mg  0.5 mg Sublingual Q4H PRN Barbarann Nest, MD       Or   haloperidol  lactate (HALDOL ) injection 0.5 mg  0.5 mg Intravenous Q4H PRN Barbarann Nest, MD       HYDROmorphone  (DILAUDID ) 50 mg in 50 mL NS (1mg /mL) premix infusion  0.5-4 mg/hr Intravenous Continuous Barbarann Nest, MD 1.5 mL/hr at 10/27/23 1432 1.5 mg/hr at 10/27/23 1432   HYDROmorphone  (DILAUDID ) bolus via infusion 0.25-1 mg  0.25-1 mg Intravenous Q30 min PRN Barbarann Nest, MD   0.5 mg at 10/27/23 1433   LORazepam  (ATIVAN ) tablet 1 mg  1 mg Oral Q4H PRN Barbarann Nest, MD   1 mg at 10/27/23 1132   Or   LORazepam  (ATIVAN ) 2 MG/ML concentrated solution 1 mg  1 mg Sublingual Q4H PRN Barbarann Nest, MD       Or   LORazepam  (ATIVAN ) injection 1 mg  1 mg Intravenous Q4H PRN Barbarann Nest, MD       morphine  (MS CONTIN ) 12 hr tablet 15 mg  15 mg Oral Q8H Yu, Zhou, MD   15 mg at 10/27/23 9451   nicotine  (NICODERM CQ  - dosed in mg/24 hours) patch 14 mg  14 mg Transdermal Daily Barbarann Nest, MD   14 mg at 10/27/23 9047   ondansetron  (ZOFRAN ) injection 4 mg  4 mg Intravenous Q6H PRN Barbarann Nest, MD   4 mg at 10/27/23 1135   oxyCODONE  (Oxy IR/ROXICODONE ) immediate release tablet 5 mg  5 mg Oral Q4H PRN Barbarann Nest, MD   5 mg at 10/27/23 0758   polyethylene glycol (MIRALAX  / GLYCOLAX ) packet 17 g  17 g Oral Daily PRN Barbarann Nest, MD       prochlorperazine  (COMPAZINE ) tablet 10 mg  10 mg Oral Q6H PRN Barbarann Nest, MD       senna-docusate (Senokot-S) tablet 2 tablet  2 tablet Oral Daily Babara Call, MD   2 tablet at 10/27/23 0802   sodium chloride  flush (NS) 0.9 % injection 3 mL  3 mL Intravenous Q12H Yates, Jennifer, MD   3 mL at 10/27/23 0802    Facility-Administered Medications Ordered in Other Encounters  Medication Dose Route Frequency Provider Last Rate Last Admin   famotidine  (PEPCID ) 20-0.9 MG/50ML-% IVPB             VITAL SIGNS: BP (!) 74/58   Pulse (!) 123   Temp 98.6 F (37 C) (Oral)   Resp 20   Ht 5' 2 (1.575 m)   Wt 101 lb (45.8 kg)   LMP 11/19/2002 (Approximate)   SpO2 95%   BMI 18.47 kg/m  Filed Weights   10/25/23 9367 10/26/23 1625  Weight: 103 lb (46.7 kg) 101 lb (45.8 kg)    Estimated body mass index is 18.47 kg/m as calculated from the following:   Height as of this encounter: 5' 2 (1.575 m).   Weight as of this encounter: 101 lb (45.8 kg).  LABS: CBC:    Component Value Date/Time   WBC 0.8 (LL) 10/27/2023 0742   HGB 9.7 (L) 10/27/2023 0742   HGB 11.3 (L) 10/19/2023 0841   HGB 13.9 06/17/2022 1349   HCT 29.4 (L) 10/27/2023 0742   HCT 41.6 06/17/2022 1349   PLT 101 (L) 10/27/2023 0742   PLT 480 (H) 10/19/2023 0841   PLT 272 06/17/2022 1349   MCV 80.1 10/27/2023 0742   MCV 91 06/17/2022 1349   MCV 100 02/17/2014 0323   NEUTROABS 4.6 10/25/2023 0637   NEUTROABS 6.3 06/17/2022 1349   NEUTROABS 5.9 02/17/2014 0323   LYMPHSABS 0.9 10/25/2023 0637   LYMPHSABS 1.8 06/17/2022 1349   LYMPHSABS 3.2 02/17/2014 0323   MONOABS 0.1 10/25/2023 0637   MONOABS 0.4 02/17/2014 0323   EOSABS 0.0 10/25/2023 0637   EOSABS 0.0 06/17/2022 1349  EOSABS 0.0 02/17/2014 0323   BASOSABS 0.0 10/25/2023 0637   BASOSABS 0.0 06/17/2022 1349   BASOSABS 0.1 02/17/2014 0323   Comprehensive Metabolic Panel:    Component Value Date/Time   NA 131 (L) 10/27/2023 0742   NA 135 06/17/2022 1349   NA 134 (L) 02/17/2014 0323   K 3.2 (L) 10/27/2023 0742   K 3.3 (L) 02/17/2014 1113   CL 101 10/27/2023 0742   CL 100 02/17/2014 0323   CO2 17 (L) 10/27/2023 0742   CO2 26 02/17/2014 0323   BUN 27 (H) 10/27/2023 0742   BUN 12 06/17/2022 1349   BUN 6 (L) 02/17/2014 0323   CREATININE 1.25 (H) 10/27/2023 0742    CREATININE 0.83 10/19/2023 0841   CREATININE 0.77 01/27/2017 1418   GLUCOSE 88 10/27/2023 0742   GLUCOSE 108 (H) 02/17/2014 0323   CALCIUM  7.4 (L) 10/27/2023 0742   CALCIUM  7.9 (L) 02/17/2014 0323   AST 30 10/26/2023 0536   AST 22 10/19/2023 0841   ALT 39 10/26/2023 0536   ALT 13 10/19/2023 0841   ALT 36 02/17/2014 0323   ALKPHOS 255 (H) 10/26/2023 0536   ALKPHOS 47 02/17/2014 0323   BILITOT 0.8 10/26/2023 0536   BILITOT 0.4 10/19/2023 0841   PROT 6.4 (L) 10/26/2023 0536   PROT 6.7 06/17/2022 1349   PROT 5.9 (L) 02/17/2014 0323   ALBUMIN  3.0 (L) 10/26/2023 0536   ALBUMIN  4.5 06/17/2022 1349   ALBUMIN  3.0 (L) 02/17/2014 0323    RADIOGRAPHIC STUDIES: CT CHEST ABDOMEN PELVIS WO CONTRAST Result Date: 10/25/2023 CLINICAL DATA:  Metastatic disease evaluation.  Right breast cancer. EXAM: CT CHEST, ABDOMEN AND PELVIS WITHOUT CONTRAST TECHNIQUE: Multidetector CT imaging of the chest, abdomen and pelvis was performed following the standard protocol without IV contrast. RADIATION DOSE REDUCTION: This exam was performed according to the departmental dose-optimization program which includes automated exposure control, adjustment of the mA and/or kV according to patient size and/or use of iterative reconstruction technique. COMPARISON:  PET CT 08/11/2023. CT chest abdomen and pelvis 07/31/2023. FINDINGS: CT CHEST FINDINGS Cardiovascular: Heart and aorta are normal in size. There is a trace pericardial effusion similar to prior. There are atherosclerotic calcifications of the aorta. Left chest port catheter tip ends in the distal SVC. Mediastinum/Nodes: Esophagus and visualized thyroid  gland are within normal limits. There is a new enlarged lymph nodes in the upper prevascular region/superior mediastinum measuring 2.8 x 1.9 cm image 2/15. Left hilar lymphadenopathy has increased with largest lymph node now measuring 2.0 by 2.6 cm. Lungs/Pleura: Mild emphysema again noted. Right pleural base nodules  measuring up to 10 mm appear unchanged in the right lower lung. Cavitary nodule in the right upper lobe has slightly decreased in size now measuring 7 mm (previously 11 mm). There is a new medial left upper lobe mass invading the anterior mediastinum and right hilum measuring 6.5 by 2.7 cm image 4/56. There is new occlusion of left upper lobe bronchi with adjacent nodularity likely related to to direct spread of tumor or lymphangitic spread. Left lower lobe pulmonary nodule measuring 10 mm previously measured 8 mm. There some secretions seen within the trachea and bilateral mainstem bronchi extending into left lower lobe bronchi. There is a new trace left pleural effusion. Musculoskeletal: Mild T11 compression deformity is new from prior. Right mastectomy changes are present. CT ABDOMEN PELVIS FINDINGS Hepatobiliary: The metastatic lesions throughout the liver have increased in size and number. The largest now measures 3 cm. Gallbladder and bile ducts are within  normal limits. Pancreas: Unremarkable. No pancreatic ductal dilatation or surrounding inflammatory changes. Spleen: Normal in size without focal abnormality. Adrenals/Urinary Tract: There are new bilateral adrenal gland nodules measuring 14 mm on the left and 14 mm on the right. The kidneys and bladder are within normal limits. Stomach/Bowel: Stomach is within normal limits. Appendix appears normal. No evidence of bowel wall thickening, distention, or inflammatory changes. Vascular/Lymphatic: Aortic atherosclerosis. There are new enlarged portacaval and gastrohepatic lymph nodes measuring up to 14 mm. Reproductive: Uterus and bilateral adnexa are unremarkable. Other: There is trace ascites, unchanged. There are new scattered peritoneal nodules with the largest in the anterior abdomen measuring up to 15 mm. Nodules are also seen in the right lower quadrant. There is a new nodule in the right abdominal wall measuring 1.5 x 1.7 cm image 2/71. There is a new  subcutaneous nodule in the anterior left abdominal wall measuring up to 11 mm. There is a new subcutaneous nodule in the right flank measuring 10 mm. Musculoskeletal: No acute or significant osseous findings. IMPRESSION: 1. New medial left upper lobe mass invading the anterior mediastinum and right hilum worrisome for metastatic disease. 2. New occlusion of left upper lobe bronchi with adjacent nodularity likely related to direct spread of tumor or lymphangitic spread. 3. New enlarged prevascular lymph node in the superior mediastinum worrisome for metastatic disease. 4. Increased size of left hilar lymphadenopathy. 5. Increased size of bilateral pulmonary nodules. 6. Increased size and number of hepatic metastatic lesions. 7. New bilateral adrenal gland nodules worrisome for metastatic disease. 8. New peritoneal nodules worrisome for metastatic disease. 9. New subcutaneous nodules in the abdominal wall and right flank worrisome for metastatic disease. 10. New trace left pleural effusion. 11. New mild T11 compression deformity. Aortic Atherosclerosis (ICD10-I70.0) and Emphysema (ICD10-J43.9). Electronically Signed   By: Greig Pique M.D.   On: 10/25/2023 17:27   DG Chest Port 1 View Result Date: 10/25/2023 CLINICAL DATA:  Shortness of breath. EXAM: PORTABLE CHEST 1 VIEW COMPARISON:  PET-CT dated 08/11/2023. FINDINGS: The heart size and mediastinal contours are unchanged. Stable left chest Port-A-Cath tip overlies the superior cavoatrial junction. Aortic atherosclerosis. Known hypermetabolic lung nodules and lymph nodes are better evaluated on the prior PET-CT dated 08/11/2023. No focal consolidation, pleural effusion, or pneumothorax. No acute osseous abnormality. IMPRESSION: 1. No acute cardiopulmonary findings. 2. Known hypermetabolic lung nodules and lymph nodes are better evaluated on the prior PET-CT dated 08/11/2023. Electronically Signed   By: Harrietta Sherry M.D.   On: 10/25/2023 08:59   MR Brain W Wo  Contrast Result Date: 10/16/2023 CLINICAL DATA:  History of breast cancer. Suspicion of metastatic disease. History of right parietal region meningioma. EXAM: MRI HEAD WITHOUT AND WITH CONTRAST TECHNIQUE: Multiplanar, multiecho pulse sequences of the brain and surrounding structures were obtained without and with intravenous contrast. CONTRAST:  4mL GADAVIST  GADOBUTROL  1 MMOL/ML IV SOLN COMPARISON:  PET scan 08/11/2023.  Brain MRI 02/24/2020. FINDINGS: Brain: No diffusion imaging was performed. There is a 3 mm metastasis within the left pons. Second 3 mm metastasis in the right side of the pons. Centrally necrotic 7 mm metastasis in the left cerebellum without edema or mass effect. Suspicion of a second 3-4 mm metastasis in the inferior left cerebellum, though this could possibly be a venous angioma. 4 mm metastasis in the right frontal lobe axial image 106. Redemonstration of a chronic benign meningioma at the left posterior parietal vertex measuring 8 mm in size without mass-effect upon the brain. 3 mm  metastasis in the left frontal lobe adjacent to the frontal horn of the lateral ventricle, axial image 92. Questionable second 3 mm metastasis in a deep sulcus of the left posterior frontal lobe, axial image 105. 3 mm metastasis at the left parieto-occipital junction, axial image 99. There is a background pattern of moderate chronic small-vessel ischemic change of the hemispheric white matter. Because these metastatic lesions are somewhat low in conspicuity, consider a 3 tesla exam for better characterization, particularly if brain irradiation is being considered. No hydrocephalus.  No extra-axial collection. Vascular: Major vessels at the base of the brain show flow. Skull and upper cervical spine: No calvarial or skull base metastasis is identified. Sinuses/Orbits: Clear/normal Other: None IMPRESSION: 1. Two small metastases in the pons. 1 or 2 metastases in the left cerebellum. One metastasis in the right frontal  lobe. Three metastases in the left cerebral hemisphere. These are small lesions, the largest being in the left cerebellum with central necrosis. None of the lesions show mass effect or vasogenic edema. See impression 4. 2. Background pattern of moderate chronic small-vessel ischemic change of the hemispheric white matter. 3. Stable 8 mm meningioma at the left posterior parietal vertex without mass-effect upon the brain. 4. Because these metastatic lesions are somewhat low in conspicuity, consider a 3 tesla exam for better characterization, particularly if stereotactic brain irradiation is being considered. Electronically Signed   By: Oneil Officer M.D.   On: 10/16/2023 16:38    PERFORMANCE STATUS (ECOG) : 4 - Bedbound  Review of Systems Unable to complete  Physical Exam General: Ill-appearing Pulmonary: Shallow Extremities: no edema, no joint deformities Skin: no rashes Neurological: unresponsive   IMPRESSION: Follow up visit. No family at bedside.   Patient was transitioned to comfort care after conversation with hospitalist team. She is currently on hydromorphone  infusion.   Patient comfortable appearing. Anticipate hospital death.   PLAN: -Comfort care  Case discussed with Dr. Babara.    Time Total: 15 minutes  Visit consisted of counseling and education dealing with the complex and emotionally intense issues of symptom management and palliative care in the setting of serious and potentially life-threatening illness.Greater than 50%  of this time was spent counseling and coordinating care related to the above assessment and plan.  Signed by: Fonda Mower, PhD, NP-C

## 2023-10-27 NOTE — Progress Notes (Signed)
 Hematology/Oncology Progress note [phone] [phone]     Patient Care Team: Carin Gauze, NP as PCP - General (Cardiology) Babara Call, MD as Consulting Physician (Oncology)   Name of the patient: Yvonne Scott  [id number]  23-Feb-1961  Date of visit: 10/27/23   INTERVAL HISTORY-   Patient was put on comfort measure overnight. Family friend at bedside. Patient is currently on Dilaudid  drip.  No Known Allergies  Patient Active Problem List   Diagnosis Date Noted   Squamous cell lung cancer, right (HCC) 08/12/2022    Priority: High   Invasive ductal carcinoma of right breast (HCC) 02/20/2021    Priority: High   Constipation 10/19/2023    Priority: Medium    Metastasis to brain (HCC) 10/18/2023    Priority: Medium    Cough 05/15/2023    Priority: Medium    Rectal adenocarcinoma (HCC) 10/13/2022    Priority: Medium    Neoplasm related pain 03/11/2021    Priority: Medium    Encounter for antineoplastic chemotherapy 03/11/2021    Priority: Medium    Hypokalemia 01/27/2017    Priority: Medium    Tobacco abuse 11/19/2015    Priority: Medium    Liver lesion 07/24/2023    Priority: Low   Acute bronchitis 05/15/2023    Priority: Low   Pain in the abdomen 02/06/2023    Priority: Low   Rib pain on right side 01/25/2022    Priority: Low   Vitamin D  deficiency 01/25/2022    Priority: Low   Thrush 01/25/2022    Priority: Low   B12 deficiency 01/06/2022    Priority: Low   Antineoplastic chemotherapy induced anemia 08/24/2021    Priority: Low   Port-A-Cath in place 07/07/2021    Priority: Low   Anxiety associated with cancer diagnosis (HCC) 07/07/2021    Priority: Low   Chemotherapy-induced neuropathy (HCC) 06/08/2021    Priority: Low   Hypomagnesemia 04/13/2021    Priority: Low   Goals of care, counseling/discussion 02/20/2021    Priority: Low   Palliative care encounter 10/26/2023   Cancer associated pain 10/26/2023   Generalized  weakness 10/25/2023   S/P closure of ileostomy 07/07/2023   Hypotension 07/03/2023   Acute renal failure (ARF) (HCC) 07/02/2023   Acute URI 06/19/2023   Ileostomy care (HCC) 05/03/2023   S/P partial colectomy 04/21/2023   Genetic testing 01/24/2023   Abnormal PET scan of colon 12/07/2022   Mixed hyperlipidemia 06/21/2022   Cramps, muscle, general 06/21/2022   Other fatigue 06/21/2022   Protein-calorie malnutrition, severe 09/07/2021   Dysphagia 09/05/2021   Prolonged QT interval 09/05/2021   Syncope, vasovagal 09/04/2021   Syncope 09/04/2021   Neutropenic fever (HCC)    HCAP (healthcare-associated pneumonia) 07/16/2021   Sepsis (HCC) 07/16/2021   Pancytopenia (HCC)    Anxiety 06/08/2021   Generalized body aches 03/11/2021   Encounter for monitoring cardiotoxic drug therapy 02/27/2021   Metabolic acidosis 02/22/2020   DKA (diabetic ketoacidosis) (HCC) 02/21/2020   Elevated LFTs 02/21/2020   AKI (acute kidney injury) (HCC) 02/21/2020   Leukocytosis 02/21/2020   Hyponatremia 01/30/2017   Hypochloremia 01/30/2017   Hyperglycemia 01/27/2017   Back pain 12/15/2016   Chronic lower back pain 12/15/2016   Elevated ferritin 11/07/2016   Aortic atherosclerosis (HCC) 10/24/2016   Fatty liver 10/24/2016   Thrombocytopenia (HCC) 10/24/2016   Medication monitoring encounter 11/19/2015   GERD (gastroesophageal reflux disease) 11/19/2015   Essential hypertension, benign 11/19/2015     Past Medical History:  Diagnosis Date  Anxiety    Aortic atherosclerosis (HCC) 10/24/2016   Breast cancer (HCC)    colon cancer  and rectal   COPD (chronic obstructive pulmonary disease) (HCC)    Depression    Dyspnea    GERD (gastroesophageal reflux disease)    Hypokalemia    Hypomagnesemia    Invasive ductal carcinoma of right breast (HCC)    Metabolic acidosis    Personal history of chemotherapy    Pneumonia    Severe protein-calorie malnutrition (HCC)    Thrombocytopenia (HCC)       Past Surgical History:  Procedure Laterality Date   BREAST BIOPSY Right 2022   BRONCHIAL BIOPSY  12/29/2022   Procedure: BRONCHIAL BIOPSIES;  Surgeon: Isadora Hose, MD;  Location: [facility];  Service: Pulmonary;;   BRONCHIAL NEEDLE ASPIRATION BIOPSY  12/29/2022   Procedure: BRONCHIAL NEEDLE ASPIRATION BIOPSIES;  Surgeon: Isadora Hose, MD;  Location: [facility];  Service: Pulmonary;;   COLONOSCOPY WITH PROPOFOL  N/A 12/07/2022   Procedure: COLONOSCOPY WITH PROPOFOL ;  Surgeon: Unk Corinn Skiff, MD;  Location: [facility];  Service: Gastroenterology;  Laterality: N/A;   COLOSTOMY TAKEDOWN N/A 07/07/2023   Procedure: TAKEDOWN OF LOOP ILEOSTOMY;  Surgeon: Teresa Lonni HERO, MD;  Location: [facility];  Service: General;  Laterality: N/A;  TAKEDOWN OF LOOP ILESTOMY   FLEXIBLE SIGMOIDOSCOPY N/A 04/21/2023   Procedure: FLEXIBLE SIGMOIDOSCOPY, ICG PERFUSION;  Surgeon: Teresa Lonni HERO, MD;  Location: [facility];  Service: General;  Laterality: N/A;   FLEXIBLE SIGMOIDOSCOPY N/A 07/07/2023   Procedure: KINGSTON SIDE;  Surgeon: Teresa Lonni HERO, MD;  Location: [facility];  Service: General;  Laterality: N/A;  DIAGNOSTIC FLEXIBLE SIGMOIDOSCOPY   FOOT SURGERY Left    little toe correction   MASTECTOMY W/ SENTINEL NODE BIOPSY Right 10/19/2021   Procedure: MASTECTOMY WITH SENTINEL LYMPH NODE BIOPSY ( modified radical);  Surgeon: Rodolph Romano, MD;  Location: [facility];  Service: General;  Laterality: Right;   POLYPECTOMY  12/07/2022   Procedure: POLYPECTOMY;  Surgeon: Unk Corinn Skiff, MD;  Location: [facility];  Service: Gastroenterology;;   PORTA CATH INSERTION  01/19/2021   SUBMUCOSAL TATTOO INJECTION  12/07/2022   Procedure: SUBMUCOSAL TATTOO INJECTION;  Surgeon: Unk Corinn Skiff, MD;  Location: [facility];  Service: Gastroenterology;;   VIDEO BRONCHOSCOPY WITH ENDOBRONCHIAL ULTRASOUND N/A 12/29/2022   Procedure: VIDEO BRONCHOSCOPY WITH ENDOBRONCHIAL  ULTRASOUND;  Surgeon: Isadora Hose, MD;  Location: [facility];  Service: Pulmonary;  Laterality: N/A;   XI ROBOTIC ASSISTED LOWER ANTERIOR RESECTION N/A 04/21/2023   Procedure: XI ROBOTIC ASSISTED LOWER ANTERIOR RESECTION, ILEOSTOMY CREATION;  Surgeon: Teresa Lonni HERO, MD;  Location: [facility];  Service: General;  Laterality: N/A;    Social History   Socioeconomic History   Marital status: Widowed    Spouse name: Not on file   Number of children: 1   Years of education: Not on file   Highest education level: Not on file  Occupational History   Not on file  Tobacco Use   Smoking status: Every Day    Types: Cigarettes   Smokeless tobacco: Never   Tobacco comments:    3-4 cigarettes a day  Vaping Use   Vaping status: Never Used  Substance and Sexual Activity   Alcohol  use: Never    Alcohol /week: 3.0 standard drinks of alcohol     Types: 3 Standard drinks or equivalent per week   Drug use: Never   Sexual activity: Not Currently    Birth control/protection: None  Other Topics Concern   Not on  file  Social History Narrative   Lives with mother   Social Drivers of Health   Financial Resource Strain: Low Risk  (01/06/2022)   Overall Financial Resource Strain (CARDIA)    Difficulty of Paying Living Expenses: Not very hard  Food Insecurity: No Food Insecurity (10/26/2023)   Hunger Vital Sign    Worried About Running Out of Food in the Last Year: Never true    Ran Out of Food in the Last Year: Never true  Transportation Needs: No Transportation Needs (10/26/2023)   PRAPARE - Administrator, Civil Service (Medical): No    Lack of Transportation (Non-Medical): No  Physical Activity: Inactive (01/06/2022)   Exercise Vital Sign    Days of Exercise per Week: 0 days    Minutes of Exercise per Session: 0 min  Stress: Stress Concern Present (01/06/2022)   [employer] of Occupational Health - Occupational Stress Questionnaire    Feeling of Stress : Rather much   Social Connections: Moderately Isolated (10/26/2023)   Social Connection and Isolation Panel    Frequency of Communication with Friends and Family: Three times a week    Frequency of Social Gatherings with Friends and Family: Three times a week    Attends Religious Services: 1 to 4 times per year    Active Member of Clubs or Organizations: No    Attends Banker Meetings: Never    Marital Status: Widowed  Intimate Partner Violence: Not At Risk (10/26/2023)   Humiliation, Afraid, Rape, and Kick questionnaire    Fear of Current or Ex-Partner: No    Emotionally Abused: No    Physically Abused: No    Sexually Abused: No     Family History  Problem Relation Age of Onset   Melanoma Father 47   Cirrhosis Maternal Grandfather    Breast cancer Paternal Grandmother      Current Facility-Administered Medications:    acetaminophen  (TYLENOL ) tablet 650 mg, 650 mg, Oral, Q6H PRN **OR** acetaminophen  (TYLENOL ) suppository 650 mg, 650 mg, Rectal, Q6H PRN, Barbarann Nest, MD   albuterol  (PROVENTIL ) (2.5 MG/3ML) 0.083% nebulizer solution 2.5 mg, 2.5 mg, Nebulization, Q6H PRN, Dail Rankin RAMAN, RPH, 2.5 mg at 10/27/23 0758   antiseptic oral rinse (BIOTENE) solution 15 mL, 15 mL, Topical, PRN, Barbarann Nest, MD   artificial tears ophthalmic solution 1 drop, 1 drop, Both Eyes, QID PRN, Barbarann Nest, MD   bisacodyl  (DULCOLAX) EC tablet 5 mg, 5 mg, Oral, Daily PRN, Barbarann Nest, MD, 5 mg at 10/25/23 1523   Chlorhexidine  Gluconate Cloth 2 % PADS 6 each, 6 each, Topical, Daily, Barbarann Nest, MD, 6 each at 10/27/23 0801   diphenhydrAMINE  (BENADRYL ) injection 12.5 mg, 12.5 mg, Intravenous, Q4H PRN, Barbarann Nest, MD   glycopyrrolate  (ROBINUL ) tablet 1 mg, 1 mg, Oral, Q4H PRN **OR** glycopyrrolate  (ROBINUL ) injection 0.2 mg, 0.2 mg, Subcutaneous, Q4H PRN **OR** glycopyrrolate  (ROBINUL ) injection 0.2 mg, 0.2 mg, Intravenous, Q4H PRN, Barbarann Nest, MD   haloperidol  (HALDOL ) tablet 0.5  mg, 0.5 mg, Oral, Q4H PRN **OR** haloperidol  (HALDOL ) 2 MG/ML solution 0.5 mg, 0.5 mg, Sublingual, Q4H PRN **OR** haloperidol  lactate (HALDOL ) injection 0.5 mg, 0.5 mg, Intravenous, Q4H PRN, Barbarann Nest, MD   HYDROmorphone  (DILAUDID ) 50 mg in 50 mL NS (1mg /mL) premix infusion, 0.5-4 mg/hr, Intravenous, Continuous, Barbarann Nest, MD, Last Rate: 1 mL/hr at 10/27/23 1118, 1 mg/hr at 10/27/23 1118   HYDROmorphone  (DILAUDID ) bolus via infusion 0.25-1 mg, 0.25-1 mg, Intravenous, Q30 min PRN, Barbarann Nest, MD, 1 mg  at 10/27/23 1253   LORazepam  (ATIVAN ) tablet 1 mg, 1 mg, Oral, Q4H PRN, 1 mg at 10/27/23 1132 **OR** LORazepam  (ATIVAN ) 2 MG/ML concentrated solution 1 mg, 1 mg, Sublingual, Q4H PRN **OR** LORazepam  (ATIVAN ) injection 1 mg, 1 mg, Intravenous, Q4H PRN, Barbarann Nest, MD   morphine  (MS CONTIN ) 12 hr tablet 15 mg, 15 mg, Oral, Q8H, Babara Call, MD, 15 mg at 10/27/23 0548   nicotine  (NICODERM CQ  - dosed in mg/24 hours) patch 14 mg, 14 mg, Transdermal, Daily, Barbarann Nest, MD, 14 mg at 10/27/23 9047   ondansetron  (ZOFRAN ) injection 4 mg, 4 mg, Intravenous, Q6H PRN, Barbarann Nest, MD, 4 mg at 10/27/23 1135   oxyCODONE  (Oxy IR/ROXICODONE ) immediate release tablet 5 mg, 5 mg, Oral, Q4H PRN, Barbarann Nest, MD, 5 mg at 10/27/23 0758   polyethylene glycol (MIRALAX  / GLYCOLAX ) packet 17 g, 17 g, Oral, Daily PRN, Barbarann Nest, MD   prochlorperazine  (COMPAZINE ) tablet 10 mg, 10 mg, Oral, Q6H PRN, Barbarann Nest, MD   senna-docusate (Senokot-S) tablet 2 tablet, 2 tablet, Oral, Daily, Babara Call, MD, 2 tablet at 10/27/23 0802   sodium chloride  flush (NS) 0.9 % injection 3 mL, 3 mL, Intravenous, Q12H, Barbarann Nest, MD, 3 mL at 10/27/23 0802  Facility-Administered Medications Ordered in Other Encounters:    famotidine  (PEPCID ) 20-0.9 MG/50ML-% IVPB, , , ,    Physical exam:  Vitals:   10/26/23 2230 10/27/23 0242 10/27/23 0400 10/27/23 0500  BP: (!) 88/70 (!) 85/51 (!) 82/60 (!) 74/58   Pulse: (!) 126 (!) 123 (!) 117 (!) 123  Resp: 20 20  20   Temp: 98.8 F (37.1 C) 98.5 F (36.9 C)  98.6 F (37 C)  TempSrc:    Oral  SpO2: 92% 95%    Weight:      Height:       Physical Exam Constitutional:      Comments: Lethargic  HENT:     Head: Normocephalic.  Cardiovascular:     Rate and Rhythm: Tachycardia present.       Labs    Latest Ref Rng & Units 10/27/2023    7:42 AM 10/26/2023    5:36 AM 10/25/2023    6:37 AM  CBC  WBC 4.0 - 10.5 K/uL 0.8  4.9  5.9   Hemoglobin 12.0 - 15.0 g/dL 9.7  88.9  87.3   Hematocrit 36.0 - 46.0 % 29.4  34.1  39.1   Platelets 150 - 400 K/uL 101  191  249       Latest Ref Rng & Units 10/27/2023    7:42 AM 10/26/2023    5:36 AM 10/25/2023    6:37 AM  CMP  Glucose 70 - 99 mg/dL 88  66  895   BUN 8 - 23 mg/dL 27  23  25    Creatinine 0.44 - 1.00 mg/dL 8.74  9.44  9.35   Sodium 135 - 145 mmol/L 131  136  133   Potassium 3.5 - 5.1 mmol/L 3.2  3.4  2.5   Chloride 98 - 111 mmol/L 101  103  95   CO2 22 - 32 mmol/L 17  20  23    Calcium  8.9 - 10.3 mg/dL 7.4  8.4  9.2   Total Protein 6.5 - 8.1 g/dL  6.4  7.5   Total Bilirubin 0.0 - 1.2 mg/dL  0.8  0.8   Alkaline Phos 38 - 126 U/L  255  350   AST 15 - 41 U/L  30  112  ALT 0 - 44 U/L  39  64      RADIOGRAPHIC STUDIES: I have personally reviewed the radiological images as listed and agreed with the findings in the report. CT CHEST ABDOMEN PELVIS WO CONTRAST Result Date: 10/25/2023 CLINICAL DATA:  Metastatic disease evaluation.  Right breast cancer. EXAM: CT CHEST, ABDOMEN AND PELVIS WITHOUT CONTRAST TECHNIQUE: Multidetector CT imaging of the chest, abdomen and pelvis was performed following the standard protocol without IV contrast. RADIATION DOSE REDUCTION: This exam was performed according to the departmental dose-optimization program which includes automated exposure control, adjustment of the mA and/or kV according to patient size and/or use of iterative reconstruction technique.  COMPARISON:  PET CT 08/11/2023. CT chest abdomen and pelvis 07/31/2023. FINDINGS: CT CHEST FINDINGS Cardiovascular: Heart and aorta are normal in size. There is a trace pericardial effusion similar to prior. There are atherosclerotic calcifications of the aorta. Left chest port catheter tip ends in the distal SVC. Mediastinum/Nodes: Esophagus and visualized thyroid  gland are within normal limits. There is a new enlarged lymph nodes in the upper prevascular region/superior mediastinum measuring 2.8 x 1.9 cm image 2/15. Left hilar lymphadenopathy has increased with largest lymph node now measuring 2.0 by 2.6 cm. Lungs/Pleura: Mild emphysema again noted. Right pleural base nodules measuring up to 10 mm appear unchanged in the right lower lung. Cavitary nodule in the right upper lobe has slightly decreased in size now measuring 7 mm (previously 11 mm). There is a new medial left upper lobe mass invading the anterior mediastinum and right hilum measuring 6.5 by 2.7 cm image 4/56. There is new occlusion of left upper lobe bronchi with adjacent nodularity likely related to to direct spread of tumor or lymphangitic spread. Left lower lobe pulmonary nodule measuring 10 mm previously measured 8 mm. There some secretions seen within the trachea and bilateral mainstem bronchi extending into left lower lobe bronchi. There is a new trace left pleural effusion. Musculoskeletal: Mild T11 compression deformity is new from prior. Right mastectomy changes are present. CT ABDOMEN PELVIS FINDINGS Hepatobiliary: The metastatic lesions throughout the liver have increased in size and number. The largest now measures 3 cm. Gallbladder and bile ducts are within normal limits. Pancreas: Unremarkable. No pancreatic ductal dilatation or surrounding inflammatory changes. Spleen: Normal in size without focal abnormality. Adrenals/Urinary Tract: There are new bilateral adrenal gland nodules measuring 14 mm on the left and 14 mm on the right. The  kidneys and bladder are within normal limits. Stomach/Bowel: Stomach is within normal limits. Appendix appears normal. No evidence of bowel wall thickening, distention, or inflammatory changes. Vascular/Lymphatic: Aortic atherosclerosis. There are new enlarged portacaval and gastrohepatic lymph nodes measuring up to 14 mm. Reproductive: Uterus and bilateral adnexa are unremarkable. Other: There is trace ascites, unchanged. There are new scattered peritoneal nodules with the largest in the anterior abdomen measuring up to 15 mm. Nodules are also seen in the right lower quadrant. There is a new nodule in the right abdominal wall measuring 1.5 x 1.7 cm image 2/71. There is a new subcutaneous nodule in the anterior left abdominal wall measuring up to 11 mm. There is a new subcutaneous nodule in the right flank measuring 10 mm. Musculoskeletal: No acute or significant osseous findings. IMPRESSION: 1. New medial left upper lobe mass invading the anterior mediastinum and right hilum worrisome for metastatic disease. 2. New occlusion of left upper lobe bronchi with adjacent nodularity likely related to direct spread of tumor or lymphangitic spread. 3. New enlarged prevascular lymph node in  the superior mediastinum worrisome for metastatic disease. 4. Increased size of left hilar lymphadenopathy. 5. Increased size of bilateral pulmonary nodules. 6. Increased size and number of hepatic metastatic lesions. 7. New bilateral adrenal gland nodules worrisome for metastatic disease. 8. New peritoneal nodules worrisome for metastatic disease. 9. New subcutaneous nodules in the abdominal wall and right flank worrisome for metastatic disease. 10. New trace left pleural effusion. 11. New mild T11 compression deformity. Aortic Atherosclerosis (ICD10-I70.0) and Emphysema (ICD10-J43.9). Electronically Signed   By: Greig Pique M.D.   On: 10/25/2023 17:27   DG Chest Port 1 View Result Date: 10/25/2023 CLINICAL DATA:  Shortness of  breath. EXAM: PORTABLE CHEST 1 VIEW COMPARISON:  PET-CT dated 08/11/2023. FINDINGS: The heart size and mediastinal contours are unchanged. Stable left chest Port-A-Cath tip overlies the superior cavoatrial junction. Aortic atherosclerosis. Known hypermetabolic lung nodules and lymph nodes are better evaluated on the prior PET-CT dated 08/11/2023. No focal consolidation, pleural effusion, or pneumothorax. No acute osseous abnormality. IMPRESSION: 1. No acute cardiopulmonary findings. 2. Known hypermetabolic lung nodules and lymph nodes are better evaluated on the prior PET-CT dated 08/11/2023. Electronically Signed   By: Harrietta Sherry M.D.   On: 10/25/2023 08:59   MR Brain W Wo Contrast Result Date: 10/16/2023 CLINICAL DATA:  History of breast cancer. Suspicion of metastatic disease. History of right parietal region meningioma. EXAM: MRI HEAD WITHOUT AND WITH CONTRAST TECHNIQUE: Multiplanar, multiecho pulse sequences of the brain and surrounding structures were obtained without and with intravenous contrast. CONTRAST:  4mL GADAVIST  GADOBUTROL  1 MMOL/ML IV SOLN COMPARISON:  PET scan 08/11/2023.  Brain MRI 02/24/2020. FINDINGS: Brain: No diffusion imaging was performed. There is a 3 mm metastasis within the left pons. Second 3 mm metastasis in the right side of the pons. Centrally necrotic 7 mm metastasis in the left cerebellum without edema or mass effect. Suspicion of a second 3-4 mm metastasis in the inferior left cerebellum, though this could possibly be a venous angioma. 4 mm metastasis in the right frontal lobe axial image 106. Redemonstration of a chronic benign meningioma at the left posterior parietal vertex measuring 8 mm in size without mass-effect upon the brain. 3 mm metastasis in the left frontal lobe adjacent to the frontal horn of the lateral ventricle, axial image 92. Questionable second 3 mm metastasis in a deep sulcus of the left posterior frontal lobe, axial image 105. 3 mm metastasis at the  left parieto-occipital junction, axial image 99. There is a background pattern of moderate chronic small-vessel ischemic change of the hemispheric white matter. Because these metastatic lesions are somewhat low in conspicuity, consider a 3 tesla exam for better characterization, particularly if brain irradiation is being considered. No hydrocephalus.  No extra-axial collection. Vascular: Major vessels at the base of the brain show flow. Skull and upper cervical spine: No calvarial or skull base metastasis is identified. Sinuses/Orbits: Clear/normal Other: None IMPRESSION: 1. Two small metastases in the pons. 1 or 2 metastases in the left cerebellum. One metastasis in the right frontal lobe. Three metastases in the left cerebral hemisphere. These are small lesions, the largest being in the left cerebellum with central necrosis. None of the lesions show mass effect or vasogenic edema. See impression 4. 2. Background pattern of moderate chronic small-vessel ischemic change of the hemispheric white matter. 3. Stable 8 mm meningioma at the left posterior parietal vertex without mass-effect upon the brain. 4. Because these metastatic lesions are somewhat low in conspicuity, consider a 3 tesla exam for better  characterization, particularly if stereotactic brain irradiation is being considered. Electronically Signed   By: Oneil Officer M.D.   On: 10/16/2023 16:38    Assessment and plan-   #Widely metastatic breast cancer, involving brain, lung, liver, bone She is in visceral crisis.  Status post cycle 1 day 1 palliative chemotherapy.  She did not tolerate well. Patient did mention that she wanted to continue to fight with cancer.  And the plan was to continue aggressive supportive care during this hospitalization and consider further treatments. Unfortunately, overnight she became hemodynamically unstable, developed hypotension.  When I saw her today, comfort care has been initiated by primary team, and patient is on  Dilaudid  drip.  Patient is lethargic and not able to further communicate with me. I talked to patient's mother over the phone.  Emotional support was provided. Thank you for allowing me to participate in the care of this patient.   [name], MD, PhD Hematology Oncology 10/27/2023

## 2023-10-27 NOTE — Progress Notes (Signed)
 Progress Note   Patient: Yvonne Scott FMW:[id number] DOB: Apr 25, 1960 DOA: 10/25/2023     1 DOS: the patient was seen and examined on 10/27/2023   Brief hospital course: 63 y.o. female with h/o invasive ductal R breast CA s/p Carboplatin /Taxol /Keytruda /mastectomy with recurrence and metastatic disease, rectal adenocarcinoma s/p resection and colostomy reversal, R lung SCC s/p radiation, depression, and COPD who presented on 8/13 with generalized weakness.  She reports progressive weakness since chemo last week, was unable to get up from the floor when she fell.  She says that she wants to keep fighting against the cancer, but also reports needing better pain control, her mother isn't able to effectively care for her at home, she isn't sure what she will do.  K+ 2.5, otherwise symptoms appear to be related to ongoing progressive metastatic cancer.  Oncology and palliative care consulted.   Assessment and Plan:  Generalized weakness with fall in the setting of metastatic cancer, hypokalemia Patient with weakness following chemo last week Progressive and resulting in a fall on day of admission She was unable to get up and her mother was unable to help Marked hypokalemia (K+ 2.5) may be contributing but this has been corrected and she remains quite weak More likely, the progressive cancer and chemotherapy effects are more likely the primary etiology Admitted to med surg   Hypokalemia Repleted   Breast/rectal/lung cancer with brain mets Saw Dr. Babara on 8/7 Resumed Carboplatin  and Taxol  x 1 dose She was also referred to radiation and is planned for CT stimulation this week and then 10 fractions of radiation Has been on dexamethasone  Repeat CT C/A/P with new medial LUL mass invading the anterior mediastinum and R hilum concerning for metastatic disease; new occlusion of LUL bronchi with adjacent nodularity from tumor or lymphangitic spread; new enlarged prevascular LN in superior mediastinum;  increased L hilar LAD; increased B pulmonary nodules; increased size and number of hepatic mets; new B adrenal gland nodules; new peritoneal nodules - essentially diffuse metastatic disease Oncology consulted, poor prognosis with progressive and incurable malignancy  Palliative care is also consulting She is now DNR She has transitioned to comfort measures I spoke with her mother who says, I've been expecting this Full comfort care order set used with Dilaudid  drip for optimal pain control   Abdominal pain Concern for constipation by EDP Given enema and finally had a good BM  Has h/o colostomy and rectal adenoCA Has some baseline rectal incontinence   Chronic pain Currently on MS Contin  15 mg TID + Oxy 5mg  q4h prn; will continue Also on dexamethasone  on admission although this is now stopped Palliative care consulted for assistance with pain control   COPD with tobacco dependence Cessation encouraged Nicotine  patch provided Continue Symbicort  (Breo per formulary) and Albuterol  prn       Consultants: Oncology Palliative care Nutrition PT OT TOC team   Procedures: None   Antibiotics: None    30 Day Unplanned Readmission Risk Score    Flowsheet Row ED to Hosp-Admission (Current) from 10/25/2023 in [facility] 1C MEDICAL TELEMETRY  30 Day Unplanned Readmission Risk Score (%) 32.21 Filed at 10/27/2023 0400    This score is the patient's risk of an unplanned readmission within 30 days of being discharged (0 -100%). The score is based on dignosis, age, lab data, medications, orders, and past utilization.   Low:  0-14.9   Medium: 15-21.9   High: 22-29.9   Extreme: 30 and above  Subjective: Overnight with hypotension.  Given IVF and midodrine  to try to improve BPs so that pain medications can be continued.  Patient began asking for comfort measures overnight.  This was confirmed with the patient this AM and the nurse was present.  The  patient is miserable and ready to stop treatment and transition to comfort care.  She understands that she is likely to suffer an in-hospital demise.   Objective: Vitals:   10/27/23 0400 10/27/23 0500  BP: (!) 82/60 (!) 74/58  Pulse: (!) 117 (!) 123  Resp:  20  Temp:  98.6 F (37 C)  SpO2:      Intake/Output Summary (Last 24 hours) at 10/27/2023 1515 Last data filed at 10/27/2023 1300 Gross per 24 hour  Intake 0 ml  Output --  Net 0 ml   Filed Weights   10/25/23 9367 10/26/23 1625  Weight: 46.7 kg 45.8 kg    Exam:  General:  Appears frail, chronically ill, wearing cap over alopecic head Eyes:   normal lids, iris ENT:  grossly normal hearing, lips & tongue, mmm; poor dentition Cardiovascular:  RR with mild tachycardia   Respiratory:   CTA bilaterally with no wheezes/rales/rhonchi.  Normal respiratory effort. Abdomen:  soft, diffusely tender, ND Skin:  no rash or induration seen on limited exam Musculoskeletal:  grossly normal tone BUE/BLE, no bony abnormality Psychiatric:  blunted mood and affect, speech fluent and appropriate, AOx3 Neurologic:  CN 2-12 grossly intact, moves all extremities in coordinated fashion  Data Reviewed: I have reviewed the patient's lab results since admission.  Pertinent labs for today include:     Na++ 131 K+ 3.2 CO2 17 BUN 27/Creatinine 1.25/GFR 48 WBC 0.8 Hgb 9.7 Platelets 101   Family Communication: None present; I spoke with her mother by telephone and she repeatedly noted I've been expecting this  Disposition: Status is: Inpatient Remains inpatient appropriate because: anticipated in-hospital demise     Time spent: 50 minutes  Unresulted Labs (From admission, onward)    None        Author: Delon Herald, MD 10/27/2023 3:15 PM  For on call review [email or web address].

## 2023-10-27 NOTE — Progress Notes (Signed)
 MEWS Progress Note  Patient Details Name: Yvonne Scott MRN: [id number] DOB: October 10, 1960 Today's Date: 10/27/2023   MEWS Flowsheet Documentation:  Assess: MEWS Score Temp: 98.5 F (36.9 C) BP: (!) 85/51 MAP (mmHg): (!) 59 Pulse Rate: (!) 123 ECG Heart Rate: 83 Resp: 20 Level of Consciousness: Alert SpO2: 95 % O2 Device: Room Air Assess: MEWS Score MEWS Temp: 0 MEWS Systolic: 1 MEWS Pulse: 2 MEWS RR: 0 MEWS LOC: 0 MEWS Score: 3 MEWS Score Color: Yellow Assess: SIRS CRITERIA SIRS Temperature : 0 SIRS Respirations : 0 SIRS Pulse: 1 SIRS WBC: 0 SIRS Score Sum : 1 SIRS Temperature : 0 SIRS Pulse: 1 SIRS Respirations : 0 SIRS WBC: 0 SIRS Score Sum : 1 Assess: if the MEWS score is Yellow or Red Were vital signs accurate and taken at a resting state?: Yes Does the patient meet 2 or more of the SIRS criteria?: No MEWS guidelines implemented : No, previously yellow, continue vital signs every 4 hours Treat MEWS Interventions: Considered administering scheduled or prn medications/treatments as ordered Take Vital Signs Increase Vital Sign Frequency : Red: Q1hr x2, continue Q4hrs until patient remains green for 12hrs Escalate MEWS: Escalate: Red: Discuss with charge nurse and notify provider. Consider notifying RRT. If remains red for 2 hours consider need for higher level of care Provider Notification Provider Name/Title: Jesus, NP Date Provider Notified: 10/27/23 Time Provider Notified: 0245 Method of Notification: Page Notification Reason: Other (Comment) (bp low, hr elevated) Provider response: See new orders Date of Provider Response: 10/27/23 Time of Provider Response: 2045  Midodrine  and 250ml bolus given as ordered. Yellow mews guidelines implemented.       Arnaldo GORMAN Agee 10/27/2023, 2:56 AM

## 2023-10-27 NOTE — Progress Notes (Signed)
 Nutrition Brief Note  Chart reviewed. Pt now transitioning to comfort care.  No further nutrition interventions planned at this time.  Please re-consult as needed.   Margery ORN, RD, LDN, CDCES Registered Dietitian III Certified Diabetes Care and Education Specialist If unable to reach this RD, please use RD Inpatient group chat on secure chat between hours of 8am-4 pm daily

## 2023-10-30 ENCOUNTER — Ambulatory Visit

## 2023-10-31 ENCOUNTER — Encounter: Payer: Self-pay | Admitting: Oncology

## 2023-11-02 ENCOUNTER — Ambulatory Visit: Admitting: Oncology

## 2023-11-02 ENCOUNTER — Ambulatory Visit

## 2023-11-02 ENCOUNTER — Other Ambulatory Visit

## 2023-11-07 ENCOUNTER — Telehealth: Admitting: Hospice and Palliative Medicine

## 2023-11-13 NOTE — Progress Notes (Signed)
 Security locker number 13. Once again the key to the locker and paperwork in the patient belonging bag with patient sticker on it at the nurses station, under the unit secretory computer desk.

## 2023-11-13 NOTE — Death Summary Note (Signed)
 DEATH SUMMARY   Patient Details  Name: Yvonne Scott MRN: [id number] DOB: 1960/06/25 ERE:Drnhhpwd, Jeoffrey, NP Admission/Discharge Information   Admit Date:  11-06-23  Date of Death: 11-09-23  Time of Death: 0221  Length of Stay: 2   Principle Cause of death: Widely metastatic cancer with primary malignancies of breast, rectum, and lung  Hospital Diagnoses: Principal Problem:   Generalized weakness Active Problems:   Tobacco abuse   Hypokalemia   Invasive ductal carcinoma of right breast (HCC)   Neoplasm related pain   Squamous cell lung cancer, right (HCC)   Rectal adenocarcinoma (HCC)   Pain in the abdomen   Metastasis to brain [facility])   Constipation   Palliative care encounter   Cancer associated pain   Hospital Course: 63 y.o. female with h/o invasive ductal R breast CA s/p Carboplatin /Taxol /Keytruda /mastectomy with recurrence and metastatic disease, rectal adenocarcinoma s/p resection and colostomy reversal, R lung SCC s/p radiation, depression, and COPD who presented on November 06, 2023 with generalized weakness.  She reported progressive weakness since chemo last week, was unable to get up from the floor when she fell.  She wanted to keep fighting against the cancer, but decompensated and eventually agreed that comfort care was appropriate.  Oncology and palliative care consulted. She died peacefully within 24 hours of transitioning to comfort care.  Assessment and Plan:  Generalized weakness with fall in the setting of metastatic cancer, hypokalemia Patient with weakness following chemo last week Progressive and resulting in a fall on day of admission She was unable to get up and her mother was unable to help Marked hypokalemia (K+ 2.5) was corrected without improvement More likely, the progressive cancer and chemotherapy effects are more likely the primary etiology Admitted to med surg   Breast/rectal/lung cancer with brain mets Saw Dr. Babara on 8/7 Resumed Carboplatin   and Taxol  x 1 dose She was also referred to radiation and is planned for CT stimulation this week and then 10 fractions of radiation Has been on dexamethasone  Repeat CT C/A/P with new medial LUL mass invading the anterior mediastinum and R hilum concerning for metastatic disease; new occlusion of LUL bronchi with adjacent nodularity from tumor or lymphangitic spread; new enlarged prevascular LN in superior mediastinum; increased L hilar LAD; increased B pulmonary nodules; increased size and number of hepatic mets; new B adrenal gland nodules; new peritoneal nodules - essentially diffuse metastatic disease Oncology consulted, poor prognosis with progressive and incurable malignancy  Palliative care is also consulting Made DNR and then transitioned to comfort measures I spoke with her mother who says, I've been expecting this Full comfort care order set used with Dilaudid  drip for optimal pain control Patient died peacefully within 24 hours  Consultants: Oncology Palliative care Nutrition PT OT TOC team   Procedures: None   Antibiotics: None  The results of significant diagnostics from this hospitalization (including imaging, microbiology, ancillary and laboratory) are listed below for reference.   Significant Diagnostic Studies: CT CHEST ABDOMEN PELVIS WO CONTRAST Result Date: Nov 06, 2023 CLINICAL DATA:  Metastatic disease evaluation.  Right breast cancer. EXAM: CT CHEST, ABDOMEN AND PELVIS WITHOUT CONTRAST TECHNIQUE: Multidetector CT imaging of the chest, abdomen and pelvis was performed following the standard protocol without IV contrast. RADIATION DOSE REDUCTION: This exam was performed according to the departmental dose-optimization program which includes automated exposure control, adjustment of the mA and/or kV according to patient size and/or use of iterative reconstruction technique. COMPARISON:  PET CT 08/11/2023. CT chest abdomen and pelvis 07/31/2023. FINDINGS: CT  CHEST  FINDINGS Cardiovascular: Heart and aorta are normal in size. There is a trace pericardial effusion similar to prior. There are atherosclerotic calcifications of the aorta. Left chest port catheter tip ends in the distal SVC. Mediastinum/Nodes: Esophagus and visualized thyroid  gland are within normal limits. There is a new enlarged lymph nodes in the upper prevascular region/superior mediastinum measuring 2.8 x 1.9 cm image 2/15. Left hilar lymphadenopathy has increased with largest lymph node now measuring 2.0 by 2.6 cm. Lungs/Pleura: Mild emphysema again noted. Right pleural base nodules measuring up to 10 mm appear unchanged in the right lower lung. Cavitary nodule in the right upper lobe has slightly decreased in size now measuring 7 mm (previously 11 mm). There is a new medial left upper lobe mass invading the anterior mediastinum and right hilum measuring 6.5 by 2.7 cm image 4/56. There is new occlusion of left upper lobe bronchi with adjacent nodularity likely related to to direct spread of tumor or lymphangitic spread. Left lower lobe pulmonary nodule measuring 10 mm previously measured 8 mm. There some secretions seen within the trachea and bilateral mainstem bronchi extending into left lower lobe bronchi. There is a new trace left pleural effusion. Musculoskeletal: Mild T11 compression deformity is new from prior. Right mastectomy changes are present. CT ABDOMEN PELVIS FINDINGS Hepatobiliary: The metastatic lesions throughout the liver have increased in size and number. The largest now measures 3 cm. Gallbladder and bile ducts are within normal limits. Pancreas: Unremarkable. No pancreatic ductal dilatation or surrounding inflammatory changes. Spleen: Normal in size without focal abnormality. Adrenals/Urinary Tract: There are new bilateral adrenal gland nodules measuring 14 mm on the left and 14 mm on the right. The kidneys and bladder are within normal limits. Stomach/Bowel: Stomach is within normal  limits. Appendix appears normal. No evidence of bowel wall thickening, distention, or inflammatory changes. Vascular/Lymphatic: Aortic atherosclerosis. There are new enlarged portacaval and gastrohepatic lymph nodes measuring up to 14 mm. Reproductive: Uterus and bilateral adnexa are unremarkable. Other: There is trace ascites, unchanged. There are new scattered peritoneal nodules with the largest in the anterior abdomen measuring up to 15 mm. Nodules are also seen in the right lower quadrant. There is a new nodule in the right abdominal wall measuring 1.5 x 1.7 cm image 2/71. There is a new subcutaneous nodule in the anterior left abdominal wall measuring up to 11 mm. There is a new subcutaneous nodule in the right flank measuring 10 mm. Musculoskeletal: No acute or significant osseous findings. IMPRESSION: 1. New medial left upper lobe mass invading the anterior mediastinum and right hilum worrisome for metastatic disease. 2. New occlusion of left upper lobe bronchi with adjacent nodularity likely related to direct spread of tumor or lymphangitic spread. 3. New enlarged prevascular lymph node in the superior mediastinum worrisome for metastatic disease. 4. Increased size of left hilar lymphadenopathy. 5. Increased size of bilateral pulmonary nodules. 6. Increased size and number of hepatic metastatic lesions. 7. New bilateral adrenal gland nodules worrisome for metastatic disease. 8. New peritoneal nodules worrisome for metastatic disease. 9. New subcutaneous nodules in the abdominal wall and right flank worrisome for metastatic disease. 10. New trace left pleural effusion. 11. New mild T11 compression deformity. Aortic Atherosclerosis (ICD10-I70.0) and Emphysema (ICD10-J43.9). Electronically Signed   By: Greig Pique M.D.   On: 10/25/2023 17:27   DG Chest Port 1 View Result Date: 10/25/2023 CLINICAL DATA:  Shortness of breath. EXAM: PORTABLE CHEST 1 VIEW COMPARISON:  PET-CT dated 08/11/2023. FINDINGS: The  heart size  and mediastinal contours are unchanged. Stable left chest Port-A-Cath tip overlies the superior cavoatrial junction. Aortic atherosclerosis. Known hypermetabolic lung nodules and lymph nodes are better evaluated on the prior PET-CT dated 08/11/2023. No focal consolidation, pleural effusion, or pneumothorax. No acute osseous abnormality. IMPRESSION: 1. No acute cardiopulmonary findings. 2. Known hypermetabolic lung nodules and lymph nodes are better evaluated on the prior PET-CT dated 08/11/2023. Electronically Signed   By: Harrietta Sherry M.D.   On: 10/25/2023 08:59   MR Brain W Wo Contrast Result Date: 10/16/2023 CLINICAL DATA:  History of breast cancer. Suspicion of metastatic disease. History of right parietal region meningioma. EXAM: MRI HEAD WITHOUT AND WITH CONTRAST TECHNIQUE: Multiplanar, multiecho pulse sequences of the brain and surrounding structures were obtained without and with intravenous contrast. CONTRAST:  4mL GADAVIST  GADOBUTROL  1 MMOL/ML IV SOLN COMPARISON:  PET scan 08/11/2023.  Brain MRI 02/24/2020. FINDINGS: Brain: No diffusion imaging was performed. There is a 3 mm metastasis within the left pons. Second 3 mm metastasis in the right side of the pons. Centrally necrotic 7 mm metastasis in the left cerebellum without edema or mass effect. Suspicion of a second 3-4 mm metastasis in the inferior left cerebellum, though this could possibly be a venous angioma. 4 mm metastasis in the right frontal lobe axial image 106. Redemonstration of a chronic benign meningioma at the left posterior parietal vertex measuring 8 mm in size without mass-effect upon the brain. 3 mm metastasis in the left frontal lobe adjacent to the frontal horn of the lateral ventricle, axial image 92. Questionable second 3 mm metastasis in a deep sulcus of the left posterior frontal lobe, axial image 105. 3 mm metastasis at the left parieto-occipital junction, axial image 99. There is a background pattern of moderate  chronic small-vessel ischemic change of the hemispheric white matter. Because these metastatic lesions are somewhat low in conspicuity, consider a 3 tesla exam for better characterization, particularly if brain irradiation is being considered. No hydrocephalus.  No extra-axial collection. Vascular: Major vessels at the base of the brain show flow. Skull and upper cervical spine: No calvarial or skull base metastasis is identified. Sinuses/Orbits: Clear/normal Other: None IMPRESSION: 1. Two small metastases in the pons. 1 or 2 metastases in the left cerebellum. One metastasis in the right frontal lobe. Three metastases in the left cerebral hemisphere. These are small lesions, the largest being in the left cerebellum with central necrosis. None of the lesions show mass effect or vasogenic edema. See impression 4. 2. Background pattern of moderate chronic small-vessel ischemic change of the hemispheric white matter. 3. Stable 8 mm meningioma at the left posterior parietal vertex without mass-effect upon the brain. 4. Because these metastatic lesions are somewhat low in conspicuity, consider a 3 tesla exam for better characterization, particularly if stereotactic brain irradiation is being considered. Electronically Signed   By: Oneil Officer M.D.   On: 10/16/2023 16:38    Microbiology: No results found for this or any previous visit (from the past 240 hours).  Time spent: <30 minutes  Signed: Delon Herald, MD 2023/11/05

## 2023-11-13 NOTE — Progress Notes (Addendum)
 Pt without heart rate or breathing. Charge RN Arland was second RN to pronounce time of death at 0221. Mother [name] notified of patient's passing.   Per mother, No autopsy requested. Pt has a t-shirt, 1 pair of yellow metal earring, and 3 yellow metal rings on. Per mother request, to send all personal belonging to locker here and she will come pick it up.   Security unable to lock clothing up so only jewelry sent to be locked up, envelope number [id number]. Two RN signed the envelope instead of patient. Arland Oas RN  On call provider notified. Will contact [facility].   Pt's T-shirt and hat placed in belonging bag and at the nurses station.

## 2023-11-13 DEATH — deceased

## 2023-11-22 NOTE — Telephone Encounter (Signed)
 Error
